# Patient Record
Sex: Female | Born: 1945
Health system: Southern US, Community
[De-identification: ages and names within clinical notes are randomized; demographics above are authoritative.]

## PROBLEM LIST (undated history)

## (undated) DIAGNOSIS — S82891A Other fracture of right lower leg, initial encounter for closed fracture: Secondary | ICD-10-CM

## (undated) DIAGNOSIS — M1990 Unspecified osteoarthritis, unspecified site: Secondary | ICD-10-CM

## (undated) DIAGNOSIS — D126 Benign neoplasm of colon, unspecified: Secondary | ICD-10-CM

## (undated) DIAGNOSIS — M359 Systemic involvement of connective tissue, unspecified: Secondary | ICD-10-CM

## (undated) DIAGNOSIS — F419 Anxiety disorder, unspecified: Secondary | ICD-10-CM

## (undated) DIAGNOSIS — I219 Acute myocardial infarction, unspecified: Secondary | ICD-10-CM

## (undated) DIAGNOSIS — G8929 Other chronic pain: Secondary | ICD-10-CM

## (undated) DIAGNOSIS — G629 Polyneuropathy, unspecified: Secondary | ICD-10-CM

## (undated) DIAGNOSIS — I10 Essential (primary) hypertension: Secondary | ICD-10-CM

## (undated) DIAGNOSIS — I4819 Other persistent atrial fibrillation: Secondary | ICD-10-CM

## (undated) DIAGNOSIS — H269 Unspecified cataract: Secondary | ICD-10-CM

## (undated) DIAGNOSIS — K219 Gastro-esophageal reflux disease without esophagitis: Secondary | ICD-10-CM

## (undated) DIAGNOSIS — R251 Tremor, unspecified: Secondary | ICD-10-CM

## (undated) DIAGNOSIS — M797 Fibromyalgia: Secondary | ICD-10-CM

## (undated) DIAGNOSIS — E785 Hyperlipidemia, unspecified: Secondary | ICD-10-CM

## (undated) DIAGNOSIS — J45909 Unspecified asthma, uncomplicated: Secondary | ICD-10-CM

## (undated) DIAGNOSIS — F32A Depression, unspecified: Secondary | ICD-10-CM

## (undated) DIAGNOSIS — D649 Anemia, unspecified: Secondary | ICD-10-CM

## (undated) DIAGNOSIS — I509 Heart failure, unspecified: Secondary | ICD-10-CM

## (undated) DIAGNOSIS — M545 Low back pain, unspecified: Secondary | ICD-10-CM

## (undated) DIAGNOSIS — F329 Major depressive disorder, single episode, unspecified: Secondary | ICD-10-CM

## (undated) DIAGNOSIS — J449 Chronic obstructive pulmonary disease, unspecified: Secondary | ICD-10-CM

## (undated) HISTORY — PX: VAGINAL HYSTERECTOMY: SUR661

## (undated) HISTORY — DX: Anxiety disorder, unspecified: F41.9

## (undated) HISTORY — DX: Benign neoplasm of colon, unspecified: D12.6

## (undated) HISTORY — PX: APPENDECTOMY: SHX54

## (undated) HISTORY — PX: TUBAL LIGATION: SHX77

## (undated) HISTORY — DX: Anemia, unspecified: D64.9

## (undated) HISTORY — DX: Gastro-esophageal reflux disease without esophagitis: K21.9

## (undated) HISTORY — DX: Hyperlipidemia, unspecified: E78.5

## (undated) HISTORY — PX: TONSILLECTOMY: SUR1361

## (undated) HISTORY — DX: Other persistent atrial fibrillation: I48.19

## (undated) HISTORY — DX: Fibromyalgia: M79.7

## (undated) HISTORY — DX: Major depressive disorder, single episode, unspecified: F32.9

## (undated) HISTORY — PX: DILATION AND CURETTAGE OF UTERUS: SHX78

## (undated) HISTORY — PX: FRACTURE SURGERY: SHX138

## (undated) HISTORY — DX: Depression, unspecified: F32.A

---

## 1976-09-22 HISTORY — PX: CHOLECYSTECTOMY OPEN: SUR202

## 2000-09-22 HISTORY — PX: SPLENECTOMY: SUR1306

## 2000-09-22 HISTORY — PX: ROUX-EN-Y GASTRIC BYPASS: SHX1104

## 2003-09-23 HISTORY — PX: MEDIAL PARTIAL KNEE REPLACEMENT: SHX5965

## 2007-09-23 DIAGNOSIS — I509 Heart failure, unspecified: Secondary | ICD-10-CM

## 2007-09-23 HISTORY — DX: Heart failure, unspecified: I50.9

## 2008-09-08 ENCOUNTER — Encounter (HOSPITAL_COMMUNITY): Admission: RE | Admit: 2008-09-08 | Discharge: 2008-09-20 | Payer: Self-pay | Admitting: Preventative Medicine

## 2009-06-01 ENCOUNTER — Encounter (HOSPITAL_COMMUNITY): Admission: RE | Admit: 2009-06-01 | Discharge: 2009-06-21 | Payer: Self-pay | Admitting: Preventative Medicine

## 2010-10-14 ENCOUNTER — Encounter: Payer: Self-pay | Admitting: Preventative Medicine

## 2011-09-23 HISTORY — PX: FEMUR FRACTURE SURGERY: SHX633

## 2011-10-17 ENCOUNTER — Other Ambulatory Visit (HOSPITAL_COMMUNITY): Payer: Self-pay | Admitting: Physician Assistant

## 2011-10-17 DIAGNOSIS — Z1231 Encounter for screening mammogram for malignant neoplasm of breast: Secondary | ICD-10-CM

## 2011-10-20 ENCOUNTER — Ambulatory Visit (HOSPITAL_COMMUNITY)
Admission: RE | Admit: 2011-10-20 | Discharge: 2011-10-20 | Disposition: A | Discharge status: Home / Self Care | Payer: Self-pay | Source: Ambulatory Visit | Attending: Physician Assistant | Admitting: Physician Assistant

## 2011-10-20 DIAGNOSIS — Z1231 Encounter for screening mammogram for malignant neoplasm of breast: Secondary | ICD-10-CM

## 2012-11-10 ENCOUNTER — Other Ambulatory Visit (HOSPITAL_COMMUNITY): Payer: Self-pay | Admitting: Family Medicine

## 2012-11-10 DIAGNOSIS — R1012 Left upper quadrant pain: Secondary | ICD-10-CM

## 2012-11-15 ENCOUNTER — Ambulatory Visit (HOSPITAL_COMMUNITY)
Admission: RE | Admit: 2012-11-15 | Discharge: 2012-11-15 | Disposition: A | Discharge status: Home / Self Care | Payer: Medicare Other | Source: Ambulatory Visit | Attending: Family Medicine | Admitting: Family Medicine

## 2012-11-15 DIAGNOSIS — R1012 Left upper quadrant pain: Secondary | ICD-10-CM | POA: Insufficient documentation

## 2012-11-15 DIAGNOSIS — K449 Diaphragmatic hernia without obstruction or gangrene: Secondary | ICD-10-CM | POA: Insufficient documentation

## 2012-11-15 DIAGNOSIS — K5730 Diverticulosis of large intestine without perforation or abscess without bleeding: Secondary | ICD-10-CM | POA: Insufficient documentation

## 2012-11-15 DIAGNOSIS — Z9884 Bariatric surgery status: Secondary | ICD-10-CM | POA: Insufficient documentation

## 2012-11-15 MED ORDER — IOHEXOL 300 MG/ML  SOLN
100.0000 mL | Freq: Once | INTRAMUSCULAR | Status: AC | PRN
  Administered 2012-11-15: 100 mL via INTRAVENOUS

## 2013-02-25 ENCOUNTER — Other Ambulatory Visit: Payer: Self-pay | Admitting: *Deleted

## 2013-02-25 ENCOUNTER — Other Ambulatory Visit: Payer: Self-pay

## 2013-02-25 DIAGNOSIS — W57XXXA Bitten or stung by nonvenomous insect and other nonvenomous arthropods, initial encounter: Secondary | ICD-10-CM

## 2013-02-25 DIAGNOSIS — T148 Other injury of unspecified body region: Secondary | ICD-10-CM

## 2013-02-25 LAB — POCT CBC
Granulocyte percent: 67.5 %G (ref 37–80)
HCT, POC: 36.4 % — AB (ref 37.7–47.9)
Hemoglobin: 12 g/dL — AB (ref 12.2–16.2)
Lymph, poc: 2.4 (ref 0.6–3.4)
MCH, POC: 25.8 pg — AB (ref 27–31.2)
MCHC: 32.8 g/dL (ref 31.8–35.4)
MCV: 78.5 fL — AB (ref 80–97)
MPV: 7.4 fL (ref 0–99.8)
POC Granulocyte: 6 (ref 2–6.9)
POC LYMPH PERCENT: 27.5 %L (ref 10–50)
Platelet Count, POC: 411 10*3/uL (ref 142–424)
RBC: 4.6 M/uL (ref 4.04–5.48)
RDW, POC: 16.4 %
WBC: 8.9 10*3/uL (ref 4.6–10.2)

## 2013-02-26 ENCOUNTER — Ambulatory Visit (INDEPENDENT_AMBULATORY_CARE_PROVIDER_SITE_OTHER): Payer: Medicare Other | Admitting: Family Medicine

## 2013-02-26 ENCOUNTER — Ambulatory Visit: Payer: Self-pay

## 2013-02-26 VITALS — BP 110/76 | HR 80 | Temp 99.1°F | Wt 178.2 lb

## 2013-02-26 DIAGNOSIS — L0390 Cellulitis, unspecified: Secondary | ICD-10-CM

## 2013-02-26 DIAGNOSIS — W57XXXA Bitten or stung by nonvenomous insect and other nonvenomous arthropods, initial encounter: Secondary | ICD-10-CM

## 2013-02-26 DIAGNOSIS — L0291 Cutaneous abscess, unspecified: Secondary | ICD-10-CM

## 2013-02-26 DIAGNOSIS — T148 Other injury of unspecified body region: Secondary | ICD-10-CM

## 2013-02-26 MED ORDER — DOXYCYCLINE HYCLATE 100 MG PO TABS: 100.0000 mg | ORAL_TABLET | Freq: Two times a day (BID) | ORAL | Status: DC

## 2013-02-26 NOTE — Progress Notes (Signed)
Patient ID: STORY CONTI, female   DOB: Jul 26, 1946, 67 y.o.   MRN: [id number] SUBJECTIVE: Chief Complaint  Patient presents with  . Acute Visit    tick bite had dental sugrery on monday took antibiotics sunday nite prior to surgery   HPI: Came yesterday late and had labwork and given an appointment to come today to follow up onher multiple tick bites on her legs. Some are expanding redness and soreness and she doesn't feel right. Low grade fever started today as well. Had to chase her dog into the woods.  PMH/PSH: reviewed/updated in Epic  SH/FH: reviewed/updated in Epic  Allergies: reviewed/updated in Epic  Medications: reviewed/updated in Epic  Immunizations: reviewed/updated in Epic  ROS: As above in the HPI. All other systems are stable or negative.  OBJECTIVE: APPEARANCE:  Patient in no acute distress.The patient appeared well nourished and normally developed. Acyanotic. Waist: VITAL SIGNS:BP 110/76  Pulse 80  Temp(Src) 99.1 F (37.3 C) (Oral)  Wt 178 lb 3.2 oz (80.831 kg) Obese WF  SKIN: warm and  Dry with areas on her legs of insect bites, with red irregular indurated rashes of varying sizes, some 3 inches , some 4 inches in diameter. no tattoos and scars No bull's eye lesions, no ecchymosis.  HEAD and Neck: without JVD, Head and scalp: normal Eyes:No scleral icterus. Fundi normal, eye movements normal. Ears: Auricle normal, canal normal, Tympanic membranes normal, insufflation normal. Nose: normal Throat: normal Neck & thyroid: normal  CHEST & LUNGS: Chest wall: normal Lungs: Clear  CVS: Reveals the PMI to be normally located. Regular rhythm, First and Second Heart sounds are normal,  absence of murmurs, rubs or gallops. Peripheral vasculature: Radial pulses: normal Dorsal pedis pulses: normal Posterior pulses: normal  ABDOMEN:  Appearance: normal Benign, no organomegaly, no masses, no Abdominal Aortic enlargement. No Guarding , no rebound. No  Bruits. Bowel sounds: normal  RECTAL: N/A GU: N/A  NEUROLOGIC: oriented to time,place and person; nonfocal.  ASSESSMENT: Tick bite - Plan: doxycycline (VIBRA-TABS) 100 MG tablet  Cellulitis - Plan: doxycycline (VIBRA-TABS) 100 MG tablet  PLAN: No orders of the defined types were placed in this encounter.   Results for orders placed in visit on 02/25/13  POCT CBC      Result Value Range   WBC 8.9  4.6 - 10.2 K/uL   Lymph, poc 2.4  0.6 - 3.4   POC LYMPH PERCENT 27.5  10 - 50 %L   POC Granulocyte 6.0  2 - 6.9   Granulocyte percent 67.5  37 - 80 %G   RBC 4.6  4.04 - 5.48 M/uL   Hemoglobin 12.0 (*) 12.2 - 16.2 g/dL   HCT, POC 40.9 (*) 81.1 - 47.9 %   MCV 78.5 (*) 80 - 97 fL   MCH, POC 25.8 (*) 27 - 31.2 pg   MCHC 32.8  31.8 - 35.4 g/dL   RDW, POC 91.4     Platelet Count, POC 411.0  142 - 424 K/uL   MPV 7.4  0 - 99.8 fL   Meds ordered this encounter  Medications  . DULoxetine (CYMBALTA) 60 MG capsule    Sig: Take 60 mg by mouth daily.  . traZODone (DESYREL) 50 MG tablet    Sig: Take 50 mg by mouth at bedtime.  Marland Kitchen doxycycline (VIBRA-TABS) 100 MG tablet    Sig: Take 1 tablet (100 mg total) by mouth 2 (two) times daily.    Dispense:  28 tablet    Refill:  1   discussed with patient  That although her labs were sent and not back for RMSF and lymes  She needs to be treated because her Symptoms and early findings is highly suspicious for RMSF. Needs to start on antibiotics now. If worse with high fevers and ecchymosis and worse over the weekend she may need recheck in th ER setting. Otherwise I will recheck her on Monday. Return in about 2 days (around 02/28/2013) for Recheck medical problems.  Katalia Choma P. [place] Charon, M.D.

## 2013-02-28 ENCOUNTER — Ambulatory Visit (INDEPENDENT_AMBULATORY_CARE_PROVIDER_SITE_OTHER): Payer: Medicare Other | Admitting: Family Medicine

## 2013-02-28 ENCOUNTER — Encounter: Payer: Self-pay | Admitting: Family Medicine

## 2013-02-28 VITALS — BP 131/88 | HR 88 | Temp 98.9°F | Wt 178.2 lb

## 2013-02-28 DIAGNOSIS — L0291 Cutaneous abscess, unspecified: Secondary | ICD-10-CM

## 2013-02-28 DIAGNOSIS — W57XXXA Bitten or stung by nonvenomous insect and other nonvenomous arthropods, initial encounter: Secondary | ICD-10-CM

## 2013-02-28 DIAGNOSIS — J309 Allergic rhinitis, unspecified: Secondary | ICD-10-CM

## 2013-02-28 DIAGNOSIS — L0390 Cellulitis, unspecified: Secondary | ICD-10-CM

## 2013-02-28 DIAGNOSIS — T148 Other injury of unspecified body region: Secondary | ICD-10-CM

## 2013-02-28 LAB — ROCKY MTN SPOTTED FVR ABS PNL(IGG+IGM)
RMSF IgG: 1.31 IV — ABNORMAL HIGH
RMSF IgM: 0.36 IV

## 2013-02-28 LAB — B. BURGDORFI ANTIBODIES: B burgdorferi Ab IgG+IgM: 0.39 {ISR}

## 2013-02-28 MED ORDER — FLUTICASONE PROPIONATE 50 MCG/ACT NA SUSP: 2.0000 | Freq: Every day | NASAL | Status: DC

## 2013-02-28 NOTE — Progress Notes (Signed)
Patient ID: Sabrina [name], female   DOB: Jul 05, 1946, 67 y.o.   MRN: [id number] SUBJECTIVE: Chief Complaint  Patient presents with  . Follow-up    RECK TICK BITES DOING BETTER   HPI: The areas of tick bites are less swollen and less red. Better now except that patient has nasal congestion. Allergies kicking up.  PMH/PSH: reviewed/updated in Epic  SH/FH: reviewed/updated in Epic  Allergies: reviewed/updated in Epic  Medications: reviewed/updated in Epic  Immunizations: reviewed/updated in Epic  ROS: As above in the HPI. All other systems are stable or negative.  OBJECTIVE: APPEARANCE:  Patient in no acute distress.The patient appeared well nourished and normally developed. Acyanotic. Waist: VITAL SIGNS:BP 131/88  Pulse 88  Temp(Src) 98.9 F (37.2 C) (Oral)  Wt 178 lb 3.2 oz (80.831 kg) WF  SKIN: warm and  Dry without overt rashes, tattoos and scars. areas of tick bites are better. The cellulitis is better  HEAD and Neck: without JVD, Head and scalp: normal Eyes:No scleral icterus. Fundi normal, eye movements normal. Ears: Auricle normal, canal normal, Tympanic membranes normal, insufflation normal. Nose: rhinitis, nasal congestion Throat: normal Neck & thyroid: normal  CHEST & LUNGS: Chest wall: normal Lungs: Clear  CVS: Reveals the PMI to be normally located. Regular rhythm, First and Second Heart sounds are normal,  absence of murmurs, rubs or gallops. Peripheral vasculature: Radial pulses: normal Dorsal pedis pulses: normal Posterior pulses: normal  ABDOMEN:  Appearance: normal Benign, no organomegaly, no masses, no Abdominal Aortic enlargement. No Guarding , no rebound. No Bruits. Bowel sounds: normal  RECTAL: N/A GU: N/A  EXTREMETIES: nonedematous. Both Femoral and Pedal pulses are normal.  MUSCULOSKELETAL:  Spine: normal Joints: intact  NEUROLOGIC: oriented to time,place and person; nonfocal. Strength is normal Sensory is  normal Reflexes are normal Cranial Nerves are normal.  ASSESSMENT: Allergic rhinitis - Plan: fluticasone (FLONASE) 50 MCG/ACT nasal spray  Tick bites  Cellulitis and abscess   PLAN: Continue antibiotics Skin care. No orders of the defined types were placed in this encounter.   Meds ordered this encounter  Medications  . fluticasone (FLONASE) 50 MCG/ACT nasal spray    Sig: Place 2 sprays into the nose daily.    Dispense:  16 g    Refill:  6   Demonstrated use of inhalers.  RTC prn. Quintan Saldivar P. [place] Charon, M.D.

## 2013-03-01 NOTE — Progress Notes (Signed)
Quick Note:  Labs abnormal. Patient may have had [place] spotted fever in the past.  NOT recent with the recent tick bites. Lymes test is negative. No change in plans. ______

## 2013-03-24 ENCOUNTER — Other Ambulatory Visit: Payer: Self-pay | Admitting: *Deleted

## 2013-03-24 ENCOUNTER — Other Ambulatory Visit: Payer: Self-pay | Admitting: Family Medicine

## 2013-03-24 MED ORDER — MELOXICAM 15 MG PO TABS: 15.0000 mg | ORAL_TABLET | Freq: Every day | ORAL | Status: DC

## 2013-03-24 NOTE — Telephone Encounter (Signed)
Patient last seen in office on 02-28-13. We do not have this med on current med list. Please advise

## 2013-03-28 ENCOUNTER — Other Ambulatory Visit: Payer: Self-pay | Admitting: Family Medicine

## 2013-03-28 NOTE — Telephone Encounter (Signed)
mobic rx was sent in 03/24/13

## 2013-03-29 NOTE — Telephone Encounter (Signed)
Left message for pt that mobic had been sent in by [facility] on March 24 2013 and to return call if med not at drug store.

## 2013-04-05 NOTE — Telephone Encounter (Signed)
Patient was seen by me for vertigo and not for anxiety.  I would like her to follow up with me for xanax refill.

## 2013-04-05 NOTE — Telephone Encounter (Signed)
Bill Oxford's note on 7/15 was erroneous document on incorrect patient.

## 2014-02-01 ENCOUNTER — Ambulatory Visit (INDEPENDENT_AMBULATORY_CARE_PROVIDER_SITE_OTHER): Payer: Medicare HMO | Admitting: Family Medicine

## 2014-02-01 ENCOUNTER — Telehealth: Payer: Self-pay | Admitting: Nurse Practitioner

## 2014-02-01 ENCOUNTER — Encounter: Payer: Self-pay | Admitting: Family Medicine

## 2014-02-01 VITALS — BP 120/81 | HR 101 | Temp 99.3°F | Ht 65.0 in | Wt 186.0 lb

## 2014-02-01 DIAGNOSIS — L0292 Furuncle, unspecified: Secondary | ICD-10-CM

## 2014-02-01 DIAGNOSIS — L0293 Carbuncle, unspecified: Secondary | ICD-10-CM

## 2014-02-01 MED ORDER — AMOXICILLIN 875 MG PO TABS: 875.0000 mg | ORAL_TABLET | Freq: Two times a day (BID) | ORAL | Status: DC

## 2014-02-01 NOTE — Telephone Encounter (Signed)
appt given for today at 2:30 with bill for knot on ear

## 2014-02-01 NOTE — Progress Notes (Signed)
   Subjective:    Patient ID: Sabrina [name], female    DOB: 1945-09-23, 67 y.o.   MRN: [id number]  HPI This 68 y.o. female presents for evaluation of right ear cyst.   Review of Systems C/o cyst behind right ear   No chest pain, SOB, HA, dizziness, vision change, N/V, diarrhea, constipation, dysuria, urinary urgency or frequency, myalgias, arthralgias or rash.  Objective:   Physical Exam  Vital signs noted  Well developed well nourished female.  HEENT - Head atraumatic Normocephalic                Eyes - PERRLA, Conjuctiva - clear Sclera- Clear EOMI                Ears - cyst behind right ear w/o fluctuance                 Throat - oropharanx wnl Respiratory - Lungs CTA bilateral Cardiac - RRR S1 and S2 without murmur GI - Abdomen soft Nontender and bowel sounds active x 4      Assessment & Plan:  Furunculosis - Plan: amoxicillin (AMOXIL) 875 MG tablet po bid x 10 days.  Recommend warm Compress to area and follow up prn.  Lysbeth Penner FNP

## 2014-02-08 ENCOUNTER — Ambulatory Visit: Payer: Medicare HMO | Admitting: Family Medicine

## 2014-02-09 ENCOUNTER — Ambulatory Visit (INDEPENDENT_AMBULATORY_CARE_PROVIDER_SITE_OTHER): Payer: Medicare HMO | Admitting: Family Medicine

## 2014-02-09 ENCOUNTER — Encounter: Payer: Self-pay | Admitting: Family Medicine

## 2014-02-09 ENCOUNTER — Telehealth: Payer: Self-pay | Admitting: Family Medicine

## 2014-02-09 VITALS — BP 120/81 | HR 88 | Temp 98.6°F | Ht 65.0 in | Wt 186.2 lb

## 2014-02-09 DIAGNOSIS — F32A Depression, unspecified: Secondary | ICD-10-CM

## 2014-02-09 DIAGNOSIS — M542 Cervicalgia: Secondary | ICD-10-CM

## 2014-02-09 DIAGNOSIS — F3289 Other specified depressive episodes: Secondary | ICD-10-CM

## 2014-02-09 DIAGNOSIS — G4700 Insomnia, unspecified: Secondary | ICD-10-CM

## 2014-02-09 DIAGNOSIS — R5381 Other malaise: Secondary | ICD-10-CM

## 2014-02-09 DIAGNOSIS — F329 Major depressive disorder, single episode, unspecified: Secondary | ICD-10-CM

## 2014-02-09 DIAGNOSIS — R5383 Other fatigue: Secondary | ICD-10-CM

## 2014-02-09 MED ORDER — TRAZODONE HCL 50 MG PO TABS: 50.0000 mg | ORAL_TABLET | Freq: Every day | ORAL | Status: DC

## 2014-02-09 MED ORDER — DULOXETINE HCL 30 MG PO CPEP: 30.0000 mg | ORAL_CAPSULE | Freq: Every day | ORAL | Status: DC

## 2014-02-09 MED ORDER — MELOXICAM 15 MG PO TABS: 15.0000 mg | ORAL_TABLET | Freq: Every day | ORAL | Status: DC

## 2014-02-09 NOTE — Telephone Encounter (Signed)
Appt put in EPIC

## 2014-02-09 NOTE — Progress Notes (Signed)
   Subjective:    Patient ID: Sabrina [name], female    DOB: [birth year], 68 y.o.   MRN: [id number]  HPI This 68 y.o. female presents for evaluation of needing meds refilled.  Increased anxiety and depresson sx's and wants to get back on her medications.  She is having furunculosis of the right ear lobe and she states she has pain in her right jaw and right neck.  She is worried about having neck cancer and states she has funny taste in her mouth and she has pain in her mouth and tongue..  Review of Systems C/o right jaw and neck discomfort. No chest pain, SOB, HA, dizziness, vision change, N/V, diarrhea, constipation, dysuria, urinary urgency or frequency, myalgias, arthralgias or rash.     Objective:   Physical Exam Vital signs noted  Well developed well nourished female.  HEENT - Head atraumatic Normocephalic                Eyes - PERRLA, Conjuctiva - clear Sclera- Clear EOMI                Ears - EAC's Wnl TM's Wnl Gross Hearing WNL                Nose - Nares patent                 Throat - oropharanx wnl no masses endutulous                Neck - no palpable masses Respiratory - Lungs CTA bilateral Cardiac - RRR S1 and S2 without murmur GI - Abdomen soft Nontender and bowel sounds active x 4 Extremities - No edema. Neuro - Grossly intact.       Assessment & Plan:  Neck pain - Plan: CT Soft Tissue Neck W Contrast, meloxicam (MOBIC) 15 MG tablet, DULoxetine (CYMBALTA) 30 MG capsule, traZODone (DESYREL) 50 MG tablet, DISCONTINUED: traZODone (DESYREL) 50 MG tablet  Other malaise and fatigue - Plan: CMP14+EGFR, Anemia Profile B, Vit D  25 hydroxy (rtn osteoporosis monitoring), Thyroid Panel With TSH, Vitamin B12, meloxicam (MOBIC) 15 MG tablet, DULoxetine (CYMBALTA) 30 MG capsule, traZODone (DESYREL) 50 MG tablet, CANCELED: POCT CBC, DISCONTINUED: traZODone (DESYREL) 50 MG tablet  Depression - Plan: meloxicam (MOBIC) 15 MG tablet, DULoxetine (CYMBALTA) 30 MG capsule, traZODone  (DESYREL) 50 MG tablet, DISCONTINUED: traZODone (DESYREL) 50 MG tablet  Insomnia - Plan: meloxicam (MOBIC) 15 MG tablet, DULoxetine (CYMBALTA) 30 MG capsule, traZODone (DESYREL) 50 MG tablet, DISCONTINUED: traZODone (DESYREL) 50 MG tablet  Follow up in one month  Lysbeth Penner FNP

## 2014-02-10 LAB — CMP14+EGFR
ALT: 10 IU/L (ref 0–32)
AST: 21 IU/L (ref 0–40)
Albumin/Globulin Ratio: 1.6 (ref 1.1–2.5)
Albumin: 4.1 g/dL (ref 3.6–4.8)
Alkaline Phosphatase: 132 IU/L — ABNORMAL HIGH (ref 39–117)
BUN/Creatinine Ratio: 15 (ref 11–26)
BUN: 14 mg/dL (ref 8–27)
CO2: 21 mmol/L (ref 18–29)
Calcium: 9.3 mg/dL (ref 8.7–10.3)
Chloride: 98 mmol/L (ref 97–108)
Creatinine, Ser: 0.96 mg/dL (ref 0.57–1.00)
GFR calc Af Amer: 71 mL/min/{1.73_m2} (ref >59)
GFR calc non Af Amer: 62 mL/min/{1.73_m2} (ref >59)
Globulin, Total: 2.5 g/dL (ref 1.5–4.5)
Glucose: 77 mg/dL (ref 65–99)
Potassium: 5.2 mmol/L (ref 3.5–5.2)
Sodium: 136 mmol/L (ref 134–144)
Total Bilirubin: 0.5 mg/dL (ref 0.0–1.2)
Total Protein: 6.6 g/dL (ref 6.0–8.5)

## 2014-02-10 LAB — ANEMIA PROFILE B
Basophils Absolute: 0.1 10*3/uL (ref 0.0–0.2)
Basos: 1 %
Eos: 1 %
Eosinophils Absolute: 0.1 10*3/uL (ref 0.0–0.4)
Ferritin: 8 ng/mL — ABNORMAL LOW (ref 15–150)
Folate: 18.3 ng/mL (ref >3.0)
HCT: 40.1 % (ref 34.0–46.6)
Hemoglobin: 12.9 g/dL (ref 11.1–15.9)
Immature Grans (Abs): 0 10*3/uL (ref 0.0–0.1)
Immature Granulocytes: 0 %
Iron Saturation: 11 % — ABNORMAL LOW (ref 15–55)
Iron: 44 ug/dL (ref 35–155)
Lymphocytes Absolute: 2.3 10*3/uL (ref 0.7–3.1)
Lymphs: 23 %
MCH: 26.2 pg — ABNORMAL LOW (ref 26.6–33.0)
MCHC: 32.2 g/dL (ref 31.5–35.7)
MCV: 81 fL (ref 79–97)
Monocytes Absolute: 0.7 10*3/uL (ref 0.1–0.9)
Monocytes: 7 %
Neutrophils Absolute: 6.6 10*3/uL (ref 1.4–7.0)
Neutrophils Relative %: 68 %
Platelets: 385 10*3/uL — ABNORMAL HIGH (ref 150–379)
RBC: 4.93 x10E6/uL (ref 3.77–5.28)
RDW: 15.7 % — ABNORMAL HIGH (ref 12.3–15.4)
Retic Ct Pct: 1 % (ref 0.6–2.6)
TIBC: 414 ug/dL (ref 250–450)
UIBC: 370 ug/dL (ref 150–375)
Vitamin B-12: 155 pg/mL — ABNORMAL LOW (ref 211–946)
WBC: 9.8 10*3/uL (ref 3.4–10.8)

## 2014-02-10 LAB — VITAMIN D 25 HYDROXY (VIT D DEFICIENCY, FRACTURES): Vit D, 25-Hydroxy: 10.4 ng/mL — ABNORMAL LOW (ref 30.0–100.0)

## 2014-02-10 LAB — THYROID PANEL WITH TSH
Free Thyroxine Index: 2 (ref 1.2–4.9)
T3 Uptake Ratio: 28 % (ref 24–39)
T4, Total: 7 ug/dL (ref 4.5–12.0)
TSH: 4.41 u[IU]/mL (ref 0.450–4.500)

## 2014-02-14 ENCOUNTER — Other Ambulatory Visit: Payer: Self-pay | Admitting: Family Medicine

## 2014-02-14 DIAGNOSIS — D649 Anemia, unspecified: Secondary | ICD-10-CM

## 2014-02-14 DIAGNOSIS — E538 Deficiency of other specified B group vitamins: Secondary | ICD-10-CM

## 2014-02-14 MED ORDER — FERROUS SULFATE 325 (65 FE) MG PO TBEC: 325.0000 mg | DELAYED_RELEASE_TABLET | Freq: Every day | ORAL | Status: DC

## 2014-02-14 MED ORDER — CYANOCOBALAMIN 1000 MCG/ML IJ SOLN: INTRAMUSCULAR | Status: DC

## 2014-02-17 ENCOUNTER — Encounter (HOSPITAL_COMMUNITY): Payer: Self-pay

## 2014-02-17 ENCOUNTER — Ambulatory Visit (HOSPITAL_COMMUNITY)
Admission: RE | Admit: 2014-02-17 | Discharge: 2014-02-17 | Disposition: A | Discharge status: Home / Self Care | Payer: Medicare HMO | Source: Ambulatory Visit | Attending: Family Medicine | Admitting: Family Medicine

## 2014-02-17 DIAGNOSIS — M542 Cervicalgia: Secondary | ICD-10-CM

## 2014-02-17 DIAGNOSIS — M47812 Spondylosis without myelopathy or radiculopathy, cervical region: Secondary | ICD-10-CM | POA: Diagnosis not present

## 2014-02-17 MED ORDER — IOHEXOL 300 MG/ML  SOLN
80.0000 mL | Freq: Once | INTRAMUSCULAR | Status: AC | PRN
  Administered 2014-02-17: 80 mL via INTRAVENOUS

## 2014-02-21 ENCOUNTER — Ambulatory Visit (INDEPENDENT_AMBULATORY_CARE_PROVIDER_SITE_OTHER): Payer: Medicare HMO | Admitting: *Deleted

## 2014-02-21 DIAGNOSIS — E538 Deficiency of other specified B group vitamins: Secondary | ICD-10-CM

## 2014-02-21 MED ORDER — CYANOCOBALAMIN 1000 MCG/ML IJ SOLN
1000.0000 ug | Freq: Every day | INTRAMUSCULAR | Status: AC
  Administered 2014-02-21 – 2014-02-28 (×6): 1000 ug via INTRAMUSCULAR

## 2014-02-21 NOTE — Patient Instructions (Signed)
Vitamin B12 Injections Every person needs vitamin B12. A deficiency develops when the body does not get enough of it. One way to overcome this is by getting B12 shots (injections). A B12 shot puts the vitamin directly into muscle tissue. This avoids any problems your body might have in absorbing it from food or a pill. In some people, the body has trouble using the vitamin correctly. This can cause a B12 deficiency. Not consuming enough of the vitamin can also cause a deficiency. Getting enough vitamin B12 can be hard for elderly people. Sometimes, they do not eat a well-balanced diet. The elderly are also more likely than younger people to have medical conditions or take medications that can lead to a deficiency. WHAT DOES VITAMIN B12 DO? Vitamin B12 does many things to help the body work right:  It helps the body make healthy red blood cells.  It helps maintain nerve cells.  It is involved in the body's process of converting food into energy (metabolism).  It is needed to make the genetic material in all cells (DNA). VITAMIN B12 FOOD SOURCES Most people get plenty of vitamin B12 through the foods they eat. It is present in:  Meat, fish, poultry, and eggs.  Milk and milk products.  It also is added when certain foods are made, including some breads, cereals and yogurts. The food is then called "fortified". CAUSES The most common causes of vitamin B12 deficiency are:  Pernicious anemia. The condition develops when the body cannot make enough healthy red blood cells. This stems from a lack of a protein made in the stomach (intrinsic factor). People without this protein cannot absorb enough vitamin B12 from food.  Malabsorption. This is when the body cannot absorb the vitamin. It can be caused by:  Pernicious anemia.  Surgery to remove part or all of the stomach can lead to malabsorption. Removal of part or all of the small intestine can also cause malabsorption.  Vegetarian diet.  People who are strict about not eating foods from animals could have trouble taking in enough vitamin B12 from diet alone.  Medications. Some medicines have been linked to B12 deficiency, such as Metformin (a drug prescribed for type 2 diabetes). Long-term use of stomach acid suppressants also can keep the vitamin from being absorbed.  Intestinal problems such as inflammatory bowel disease. If there are problems in the digestive tract, vitamin B12 may not be absorbed in good enough amounts. SYMPTOMS People who do not get enough B12 can develop problems. These can include:  Anemia. This is when the body has too few red blood cells. Red blood cells carry oxygen to the rest of the body. Without a healthy supply of red blood cells, people can feel:  Tired (fatigued).  Weak.  Severe anemia can cause:  Shortness of breath.  Dizziness.  Rapid heart rate.  Paleness.  Other Vitamin B12 deficiency symptoms include:  Diarrhea.  Numbness or tingling in the hands or feet.  Loss of appetite.  Confusion.  Sores on the tongue or in the mouth. LET YOUR CAREGIVER KNOW ABOUT:  Any allergies. It is very important to know if you are allergic or sensitive to cobalt. Vitamin B12 contains cobalt.  Any history of kidney disease.  All medications you are taking. Include prescription and over-the-counter medicines, herbs and creams.  Whether you are pregnant or breast-feeding.  If you have Leber's disease, a hereditary eye condition, vitamin B12 could make it worse. RISKS AND COMPLICATIONS Reactions to an injection are   usually temporary. They might include:  Pain at the injection site.  Redness, swelling or tenderness at the site.  Headache, dizziness or weakness.  Nausea, upset stomach or diarrhea.  Numbness or tingling.  Fever.  Joint pain.  Itching or rash. If a reaction does not go away in a short while, talk with your healthcare provider. A change in the way the shots are  given, or where they are given, might need to be made. BEFORE AN INJECTION To decide whether B12 injections are right for you, your healthcare provider will probably:  Ask about your medical history.  Ask questions about your diet.  Ask about symptoms such as:  Have you felt weak?  Do you feel unusually tired?  Do you get dizzy?  Order blood tests. These may include a test to:  Check the level of red cells in your blood.  Measure B12 levels.  Check for the presence of intrinsic factor. VITAMIN B12 INJECTIONS How often you will need a vitamin B12 injection will depend on how severe your deficiency is. This also will affect how long you will need to get them. People with pernicious anemia usually get injections for their entire life. Others might get them for a shorter period. For many people, injections are given daily or weekly for several weeks. Then, once B12 levels are normal, injections are given just once a month. If the cause of the deficiency can be fixed, the injections can be stopped. Talk with your healthcare provider about what you should expect. For an injection:  The injection site will be cleaned with an alcohol swab.  Your healthcare provider will insert a needle directly into a muscle. Most any muscle can be used. Most often, an arm muscle is used. A buttocks muscle can also be used. Many people say shots in that area are less painful.  A small adhesive bandage may be put over the injection site. It usually can be taken off in an hour or less. Injections can be given by your healthcare provider. In some cases, family members give them. Sometimes, people give them to themselves. Talk with your healthcare provider about what would be best for you. If someone other than your healthcare provider will be giving the shots, the person will need to be trained to give them correctly. HOME CARE INSTRUCTIONS   You can remove the adhesive bandage within an hour of getting a  shot.  You should be able to go about your normal activities right away.  Avoid drinking large amounts of alcohol while taking vitamin B12 shots. Alcohol can interfere with the body's use of the vitamin. SEEK MEDICAL CARE IF:   Pain, redness, swelling or tenderness at the injection site does not get better or gets worse.  Headache, dizziness or weakness does not go away.  You develop a fever of more than 100.5 F (38.1 C). SEEK IMMEDIATE MEDICAL CARE IF:   You have chest pain.  You develop shortness of breath.  You have muscle weakness that gets worse.  You develop numbness, weakness or tingling on one side or one area of the body.  You have symptoms of an allergic reaction, such as:  Hives.  Difficulty breathing.  Swelling of the lips, face, tongue or throat.  You develop a fever of more than 102.0 F (38.9 C). MAKE SURE YOU:   Understand these instructions.  Will watch your condition.  Will get help right away if you are not doing well or get worse. Document   Released: 12/05/2008 Document Revised: 12/01/2011 Document Reviewed: 12/05/2008 Exit[employer] Patient Information 2014 [employer].  

## 2014-02-21 NOTE — Progress Notes (Signed)
Vitamin b12 injection started today and patient tolerated well

## 2014-02-22 ENCOUNTER — Ambulatory Visit (INDEPENDENT_AMBULATORY_CARE_PROVIDER_SITE_OTHER): Payer: Medicare HMO | Admitting: *Deleted

## 2014-02-22 DIAGNOSIS — E538 Deficiency of other specified B group vitamins: Secondary | ICD-10-CM

## 2014-02-22 NOTE — Patient Instructions (Signed)
Vitamin B12 Injections Every person needs vitamin B12. A deficiency develops when the body does not get enough of it. One way to overcome this is by getting B12 shots (injections). A B12 shot puts the vitamin directly into muscle tissue. This avoids any problems your body might have in absorbing it from food or a pill. In some people, the body has trouble using the vitamin correctly. This can cause a B12 deficiency. Not consuming enough of the vitamin can also cause a deficiency. Getting enough vitamin B12 can be hard for elderly people. Sometimes, they do not eat a well-balanced diet. The elderly are also more likely than younger people to have medical conditions or take medications that can lead to a deficiency. WHAT DOES VITAMIN B12 DO? Vitamin B12 does many things to help the body work right:  It helps the body make healthy red blood cells.  It helps maintain nerve cells.  It is involved in the body's process of converting food into energy (metabolism).  It is needed to make the genetic material in all cells (DNA). VITAMIN B12 FOOD SOURCES Most people get plenty of vitamin B12 through the foods they eat. It is present in:  Meat, fish, poultry, and eggs.  Milk and milk products.  It also is added when certain foods are made, including some breads, cereals and yogurts. The food is then called "fortified". CAUSES The most common causes of vitamin B12 deficiency are:  Pernicious anemia. The condition develops when the body cannot make enough healthy red blood cells. This stems from a lack of a protein made in the stomach (intrinsic factor). People without this protein cannot absorb enough vitamin B12 from food.  Malabsorption. This is when the body cannot absorb the vitamin. It can be caused by:  Pernicious anemia.  Surgery to remove part or all of the stomach can lead to malabsorption. Removal of part or all of the small intestine can also cause malabsorption.  Vegetarian diet.  People who are strict about not eating foods from animals could have trouble taking in enough vitamin B12 from diet alone.  Medications. Some medicines have been linked to B12 deficiency, such as Metformin (a drug prescribed for type 2 diabetes). Long-term use of stomach acid suppressants also can keep the vitamin from being absorbed.  Intestinal problems such as inflammatory bowel disease. If there are problems in the digestive tract, vitamin B12 may not be absorbed in good enough amounts. SYMPTOMS People who do not get enough B12 can develop problems. These can include:  Anemia. This is when the body has too few red blood cells. Red blood cells carry oxygen to the rest of the body. Without a healthy supply of red blood cells, people can feel:  Tired (fatigued).  Weak.  Severe anemia can cause:  Shortness of breath.  Dizziness.  Rapid heart rate.  Paleness.  Other Vitamin B12 deficiency symptoms include:  Diarrhea.  Numbness or tingling in the hands or feet.  Loss of appetite.  Confusion.  Sores on the tongue or in the mouth. LET YOUR CAREGIVER KNOW ABOUT:  Any allergies. It is very important to know if you are allergic or sensitive to cobalt. Vitamin B12 contains cobalt.  Any history of kidney disease.  All medications you are taking. Include prescription and over-the-counter medicines, herbs and creams.  Whether you are pregnant or breast-feeding.  If you have Leber's disease, a hereditary eye condition, vitamin B12 could make it worse. RISKS AND COMPLICATIONS Reactions to an injection are   usually temporary. They might include:  Pain at the injection site.  Redness, swelling or tenderness at the site.  Headache, dizziness or weakness.  Nausea, upset stomach or diarrhea.  Numbness or tingling.  Fever.  Joint pain.  Itching or rash. If a reaction does not go away in a short while, talk with your healthcare provider. A change in the way the shots are  given, or where they are given, might need to be made. BEFORE AN INJECTION To decide whether B12 injections are right for you, your healthcare provider will probably:  Ask about your medical history.  Ask questions about your diet.  Ask about symptoms such as:  Have you felt weak?  Do you feel unusually tired?  Do you get dizzy?  Order blood tests. These may include a test to:  Check the level of red cells in your blood.  Measure B12 levels.  Check for the presence of intrinsic factor. VITAMIN B12 INJECTIONS How often you will need a vitamin B12 injection will depend on how severe your deficiency is. This also will affect how long you will need to get them. People with pernicious anemia usually get injections for their entire life. Others might get them for a shorter period. For many people, injections are given daily or weekly for several weeks. Then, once B12 levels are normal, injections are given just once a month. If the cause of the deficiency can be fixed, the injections can be stopped. Talk with your healthcare provider about what you should expect. For an injection:  The injection site will be cleaned with an alcohol swab.  Your healthcare provider will insert a needle directly into a muscle. Most any muscle can be used. Most often, an arm muscle is used. A buttocks muscle can also be used. Many people say shots in that area are less painful.  A small adhesive bandage may be put over the injection site. It usually can be taken off in an hour or less. Injections can be given by your healthcare provider. In some cases, family members give them. Sometimes, people give them to themselves. Talk with your healthcare provider about what would be best for you. If someone other than your healthcare provider will be giving the shots, the person will need to be trained to give them correctly. HOME CARE INSTRUCTIONS   You can remove the adhesive bandage within an hour of getting a  shot.  You should be able to go about your normal activities right away.  Avoid drinking large amounts of alcohol while taking vitamin B12 shots. Alcohol can interfere with the body's use of the vitamin. SEEK MEDICAL CARE IF:   Pain, redness, swelling or tenderness at the injection site does not get better or gets worse.  Headache, dizziness or weakness does not go away.  You develop a fever of more than 100.5 F (38.1 C). SEEK IMMEDIATE MEDICAL CARE IF:   You have chest pain.  You develop shortness of breath.  You have muscle weakness that gets worse.  You develop numbness, weakness or tingling on one side or one area of the body.  You have symptoms of an allergic reaction, such as:  Hives.  Difficulty breathing.  Swelling of the lips, face, tongue or throat.  You develop a fever of more than 102.0 F (38.9 C). MAKE SURE YOU:   Understand these instructions.  Will watch your condition.  Will get help right away if you are not doing well or get worse. Document   Released: 12/05/2008 Document Revised: 12/01/2011 Document Reviewed: 12/05/2008 Exit[employer] Patient Information 2014 [employer].  

## 2014-02-22 NOTE — Progress Notes (Signed)
Vitamin b12 given and tolerated well. 

## 2014-02-23 ENCOUNTER — Ambulatory Visit (INDEPENDENT_AMBULATORY_CARE_PROVIDER_SITE_OTHER): Payer: Medicare HMO | Admitting: *Deleted

## 2014-02-23 DIAGNOSIS — E538 Deficiency of other specified B group vitamins: Secondary | ICD-10-CM

## 2014-02-23 NOTE — Patient Instructions (Signed)
Vitamin B12 Injections Every person needs vitamin B12. A deficiency develops when the body does not get enough of it. One way to overcome this is by getting B12 shots (injections). A B12 shot puts the vitamin directly into muscle tissue. This avoids any problems your body might have in absorbing it from food or a pill. In some people, the body has trouble using the vitamin correctly. This can cause a B12 deficiency. Not consuming enough of the vitamin can also cause a deficiency. Getting enough vitamin B12 can be hard for elderly people. Sometimes, they do not eat a well-balanced diet. The elderly are also more likely than younger people to have medical conditions or take medications that can lead to a deficiency. WHAT DOES VITAMIN B12 DO? Vitamin B12 does many things to help the body work right:  It helps the body make healthy red blood cells.  It helps maintain nerve cells.  It is involved in the body's process of converting food into energy (metabolism).  It is needed to make the genetic material in all cells (DNA). VITAMIN B12 FOOD SOURCES Most people get plenty of vitamin B12 through the foods they eat. It is present in:  Meat, fish, poultry, and eggs.  Milk and milk products.  It also is added when certain foods are made, including some breads, cereals and yogurts. The food is then called "fortified". CAUSES The most common causes of vitamin B12 deficiency are:  Pernicious anemia. The condition develops when the body cannot make enough healthy red blood cells. This stems from a lack of a protein made in the stomach (intrinsic factor). People without this protein cannot absorb enough vitamin B12 from food.  Malabsorption. This is when the body cannot absorb the vitamin. It can be caused by:  Pernicious anemia.  Surgery to remove part or all of the stomach can lead to malabsorption. Removal of part or all of the small intestine can also cause malabsorption.  Vegetarian diet.  People who are strict about not eating foods from animals could have trouble taking in enough vitamin B12 from diet alone.  Medications. Some medicines have been linked to B12 deficiency, such as Metformin (a drug prescribed for type 2 diabetes). Long-term use of stomach acid suppressants also can keep the vitamin from being absorbed.  Intestinal problems such as inflammatory bowel disease. If there are problems in the digestive tract, vitamin B12 may not be absorbed in good enough amounts. SYMPTOMS People who do not get enough B12 can develop problems. These can include:  Anemia. This is when the body has too few red blood cells. Red blood cells carry oxygen to the rest of the body. Without a healthy supply of red blood cells, people can feel:  Tired (fatigued).  Weak.  Severe anemia can cause:  Shortness of breath.  Dizziness.  Rapid heart rate.  Paleness.  Other Vitamin B12 deficiency symptoms include:  Diarrhea.  Numbness or tingling in the hands or feet.  Loss of appetite.  Confusion.  Sores on the tongue or in the mouth. LET YOUR CAREGIVER KNOW ABOUT:  Any allergies. It is very important to know if you are allergic or sensitive to cobalt. Vitamin B12 contains cobalt.  Any history of kidney disease.  All medications you are taking. Include prescription and over-the-counter medicines, herbs and creams.  Whether you are pregnant or breast-feeding.  If you have Leber's disease, a hereditary eye condition, vitamin B12 could make it worse. RISKS AND COMPLICATIONS Reactions to an injection are   usually temporary. They might include:  Pain at the injection site.  Redness, swelling or tenderness at the site.  Headache, dizziness or weakness.  Nausea, upset stomach or diarrhea.  Numbness or tingling.  Fever.  Joint pain.  Itching or rash. If a reaction does not go away in a short while, talk with your healthcare provider. A change in the way the shots are  given, or where they are given, might need to be made. BEFORE AN INJECTION To decide whether B12 injections are right for you, your healthcare provider will probably:  Ask about your medical history.  Ask questions about your diet.  Ask about symptoms such as:  Have you felt weak?  Do you feel unusually tired?  Do you get dizzy?  Order blood tests. These may include a test to:  Check the level of red cells in your blood.  Measure B12 levels.  Check for the presence of intrinsic factor. VITAMIN B12 INJECTIONS How often you will need a vitamin B12 injection will depend on how severe your deficiency is. This also will affect how long you will need to get them. People with pernicious anemia usually get injections for their entire life. Others might get them for a shorter period. For many people, injections are given daily or weekly for several weeks. Then, once B12 levels are normal, injections are given just once a month. If the cause of the deficiency can be fixed, the injections can be stopped. Talk with your healthcare provider about what you should expect. For an injection:  The injection site will be cleaned with an alcohol swab.  Your healthcare provider will insert a needle directly into a muscle. Most any muscle can be used. Most often, an arm muscle is used. A buttocks muscle can also be used. Many people say shots in that area are less painful.  A small adhesive bandage may be put over the injection site. It usually can be taken off in an hour or less. Injections can be given by your healthcare provider. In some cases, family members give them. Sometimes, people give them to themselves. Talk with your healthcare provider about what would be best for you. If someone other than your healthcare provider will be giving the shots, the person will need to be trained to give them correctly. HOME CARE INSTRUCTIONS   You can remove the adhesive bandage within an hour of getting a  shot.  You should be able to go about your normal activities right away.  Avoid drinking large amounts of alcohol while taking vitamin B12 shots. Alcohol can interfere with the body's use of the vitamin. SEEK MEDICAL CARE IF:   Pain, redness, swelling or tenderness at the injection site does not get better or gets worse.  Headache, dizziness or weakness does not go away.  You develop a fever of more than 100.5 F (38.1 C). SEEK IMMEDIATE MEDICAL CARE IF:   You have chest pain.  You develop shortness of breath.  You have muscle weakness that gets worse.  You develop numbness, weakness or tingling on one side or one area of the body.  You have symptoms of an allergic reaction, such as:  Hives.  Difficulty breathing.  Swelling of the lips, face, tongue or throat.  You develop a fever of more than 102.0 F (38.9 C). MAKE SURE YOU:   Understand these instructions.  Will watch your condition.  Will get help right away if you are not doing well or get worse. Document   Released: 12/05/2008 Document Revised: 12/01/2011 Document Reviewed: 12/05/2008 Exit[employer] Patient Information 2014 [employer].  

## 2014-02-23 NOTE — Progress Notes (Signed)
Vitamin b12 given and tolerated well. 

## 2014-02-24 ENCOUNTER — Ambulatory Visit (INDEPENDENT_AMBULATORY_CARE_PROVIDER_SITE_OTHER): Payer: Medicare HMO

## 2014-02-24 DIAGNOSIS — E538 Deficiency of other specified B group vitamins: Secondary | ICD-10-CM

## 2014-02-27 ENCOUNTER — Ambulatory Visit (INDEPENDENT_AMBULATORY_CARE_PROVIDER_SITE_OTHER): Payer: Medicare HMO | Admitting: *Deleted

## 2014-02-27 DIAGNOSIS — E538 Deficiency of other specified B group vitamins: Secondary | ICD-10-CM

## 2014-02-27 NOTE — Patient Instructions (Signed)
Vitamin B12 Injections Every person needs vitamin B12. A deficiency develops when the body does not get enough of it. One way to overcome this is by getting B12 shots (injections). A B12 shot puts the vitamin directly into muscle tissue. This avoids any problems your body might have in absorbing it from food or a pill. In some people, the body has trouble using the vitamin correctly. This can cause a B12 deficiency. Not consuming enough of the vitamin can also cause a deficiency. Getting enough vitamin B12 can be hard for elderly people. Sometimes, they do not eat a well-balanced diet. The elderly are also more likely than younger people to have medical conditions or take medications that can lead to a deficiency. WHAT DOES VITAMIN B12 DO? Vitamin B12 does many things to help the body work right:  It helps the body make healthy red blood cells.  It helps maintain nerve cells.  It is involved in the body's process of converting food into energy (metabolism).  It is needed to make the genetic material in all cells (DNA). VITAMIN B12 FOOD SOURCES Most people get plenty of vitamin B12 through the foods they eat. It is present in:  Meat, fish, poultry, and eggs.  Milk and milk products.  It also is added when certain foods are made, including some breads, cereals and yogurts. The food is then called "fortified". CAUSES The most common causes of vitamin B12 deficiency are:  Pernicious anemia. The condition develops when the body cannot make enough healthy red blood cells. This stems from a lack of a protein made in the stomach (intrinsic factor). People without this protein cannot absorb enough vitamin B12 from food.  Malabsorption. This is when the body cannot absorb the vitamin. It can be caused by:  Pernicious anemia.  Surgery to remove part or all of the stomach can lead to malabsorption. Removal of part or all of the small intestine can also cause malabsorption.  Vegetarian diet.  People who are strict about not eating foods from animals could have trouble taking in enough vitamin B12 from diet alone.  Medications. Some medicines have been linked to B12 deficiency, such as Metformin (a drug prescribed for type 2 diabetes). Long-term use of stomach acid suppressants also can keep the vitamin from being absorbed.  Intestinal problems such as inflammatory bowel disease. If there are problems in the digestive tract, vitamin B12 may not be absorbed in good enough amounts. SYMPTOMS People who do not get enough B12 can develop problems. These can include:  Anemia. This is when the body has too few red blood cells. Red blood cells carry oxygen to the rest of the body. Without a healthy supply of red blood cells, people can feel:  Tired (fatigued).  Weak.  Severe anemia can cause:  Shortness of breath.  Dizziness.  Rapid heart rate.  Paleness.  Other Vitamin B12 deficiency symptoms include:  Diarrhea.  Numbness or tingling in the hands or feet.  Loss of appetite.  Confusion.  Sores on the tongue or in the mouth. LET YOUR CAREGIVER KNOW ABOUT:  Any allergies. It is very important to know if you are allergic or sensitive to cobalt. Vitamin B12 contains cobalt.  Any history of kidney disease.  All medications you are taking. Include prescription and over-the-counter medicines, herbs and creams.  Whether you are pregnant or breast-feeding.  If you have Leber's disease, a hereditary eye condition, vitamin B12 could make it worse. RISKS AND COMPLICATIONS Reactions to an injection are   usually temporary. They might include:  Pain at the injection site.  Redness, swelling or tenderness at the site.  Headache, dizziness or weakness.  Nausea, upset stomach or diarrhea.  Numbness or tingling.  Fever.  Joint pain.  Itching or rash. If a reaction does not go away in a short while, talk with your healthcare provider. A change in the way the shots are  given, or where they are given, might need to be made. BEFORE AN INJECTION To decide whether B12 injections are right for you, your healthcare provider will probably:  Ask about your medical history.  Ask questions about your diet.  Ask about symptoms such as:  Have you felt weak?  Do you feel unusually tired?  Do you get dizzy?  Order blood tests. These may include a test to:  Check the level of red cells in your blood.  Measure B12 levels.  Check for the presence of intrinsic factor. VITAMIN B12 INJECTIONS How often you will need a vitamin B12 injection will depend on how severe your deficiency is. This also will affect how long you will need to get them. People with pernicious anemia usually get injections for their entire life. Others might get them for a shorter period. For many people, injections are given daily or weekly for several weeks. Then, once B12 levels are normal, injections are given just once a month. If the cause of the deficiency can be fixed, the injections can be stopped. Talk with your healthcare provider about what you should expect. For an injection:  The injection site will be cleaned with an alcohol swab.  Your healthcare provider will insert a needle directly into a muscle. Most any muscle can be used. Most often, an arm muscle is used. A buttocks muscle can also be used. Many people say shots in that area are less painful.  A small adhesive bandage may be put over the injection site. It usually can be taken off in an hour or less. Injections can be given by your healthcare provider. In some cases, family members give them. Sometimes, people give them to themselves. Talk with your healthcare provider about what would be best for you. If someone other than your healthcare provider will be giving the shots, the person will need to be trained to give them correctly. HOME CARE INSTRUCTIONS   You can remove the adhesive bandage within an hour of getting a  shot.  You should be able to go about your normal activities right away.  Avoid drinking large amounts of alcohol while taking vitamin B12 shots. Alcohol can interfere with the body's use of the vitamin. SEEK MEDICAL CARE IF:   Pain, redness, swelling or tenderness at the injection site does not get better or gets worse.  Headache, dizziness or weakness does not go away.  You develop a fever of more than 100.5 F (38.1 C). SEEK IMMEDIATE MEDICAL CARE IF:   You have chest pain.  You develop shortness of breath.  You have muscle weakness that gets worse.  You develop numbness, weakness or tingling on one side or one area of the body.  You have symptoms of an allergic reaction, such as:  Hives.  Difficulty breathing.  Swelling of the lips, face, tongue or throat.  You develop a fever of more than 102.0 F (38.9 C). MAKE SURE YOU:   Understand these instructions.  Will watch your condition.  Will get help right away if you are not doing well or get worse. Document   Released: 12/05/2008 Document Revised: 12/01/2011 Document Reviewed: 12/05/2008 Exit[employer] Patient Information 2014 [employer].  

## 2014-02-27 NOTE — Progress Notes (Signed)
Vitamin b12 injection tolerated well

## 2014-02-28 ENCOUNTER — Ambulatory Visit (INDEPENDENT_AMBULATORY_CARE_PROVIDER_SITE_OTHER): Payer: Medicare HMO | Admitting: *Deleted

## 2014-02-28 DIAGNOSIS — E538 Deficiency of other specified B group vitamins: Secondary | ICD-10-CM

## 2014-02-28 NOTE — Patient Instructions (Signed)
Vitamin B12 Injections Every person needs vitamin B12. A deficiency develops when the body does not get enough of it. One way to overcome this is by getting B12 shots (injections). A B12 shot puts the vitamin directly into muscle tissue. This avoids any problems your body might have in absorbing it from food or a pill. In some people, the body has trouble using the vitamin correctly. This can cause a B12 deficiency. Not consuming enough of the vitamin can also cause a deficiency. Getting enough vitamin B12 can be hard for elderly people. Sometimes, they do not eat a well-balanced diet. The elderly are also more likely than younger people to have medical conditions or take medications that can lead to a deficiency. WHAT DOES VITAMIN B12 DO? Vitamin B12 does many things to help the body work right:  It helps the body make healthy red blood cells.  It helps maintain nerve cells.  It is involved in the body's process of converting food into energy (metabolism).  It is needed to make the genetic material in all cells (DNA). VITAMIN B12 FOOD SOURCES Most people get plenty of vitamin B12 through the foods they eat. It is present in:  Meat, fish, poultry, and eggs.  Milk and milk products.  It also is added when certain foods are made, including some breads, cereals and yogurts. The food is then called "fortified". CAUSES The most common causes of vitamin B12 deficiency are:  Pernicious anemia. The condition develops when the body cannot make enough healthy red blood cells. This stems from a lack of a protein made in the stomach (intrinsic factor). People without this protein cannot absorb enough vitamin B12 from food.  Malabsorption. This is when the body cannot absorb the vitamin. It can be caused by:  Pernicious anemia.  Surgery to remove part or all of the stomach can lead to malabsorption. Removal of part or all of the small intestine can also cause malabsorption.  Vegetarian diet.  People who are strict about not eating foods from animals could have trouble taking in enough vitamin B12 from diet alone.  Medications. Some medicines have been linked to B12 deficiency, such as Metformin (a drug prescribed for type 2 diabetes). Long-term use of stomach acid suppressants also can keep the vitamin from being absorbed.  Intestinal problems such as inflammatory bowel disease. If there are problems in the digestive tract, vitamin B12 may not be absorbed in good enough amounts. SYMPTOMS People who do not get enough B12 can develop problems. These can include:  Anemia. This is when the body has too few red blood cells. Red blood cells carry oxygen to the rest of the body. Without a healthy supply of red blood cells, people can feel:  Tired (fatigued).  Weak.  Severe anemia can cause:  Shortness of breath.  Dizziness.  Rapid heart rate.  Paleness.  Other Vitamin B12 deficiency symptoms include:  Diarrhea.  Numbness or tingling in the hands or feet.  Loss of appetite.  Confusion.  Sores on the tongue or in the mouth. LET YOUR CAREGIVER KNOW ABOUT:  Any allergies. It is very important to know if you are allergic or sensitive to cobalt. Vitamin B12 contains cobalt.  Any history of kidney disease.  All medications you are taking. Include prescription and over-the-counter medicines, herbs and creams.  Whether you are pregnant or breast-feeding.  If you have Leber's disease, a hereditary eye condition, vitamin B12 could make it worse. RISKS AND COMPLICATIONS Reactions to an injection are   usually temporary. They might include:  Pain at the injection site.  Redness, swelling or tenderness at the site.  Headache, dizziness or weakness.  Nausea, upset stomach or diarrhea.  Numbness or tingling.  Fever.  Joint pain.  Itching or rash. If a reaction does not go away in a short while, talk with your healthcare provider. A change in the way the shots are  given, or where they are given, might need to be made. BEFORE AN INJECTION To decide whether B12 injections are right for you, your healthcare provider will probably:  Ask about your medical history.  Ask questions about your diet.  Ask about symptoms such as:  Have you felt weak?  Do you feel unusually tired?  Do you get dizzy?  Order blood tests. These may include a test to:  Check the level of red cells in your blood.  Measure B12 levels.  Check for the presence of intrinsic factor. VITAMIN B12 INJECTIONS How often you will need a vitamin B12 injection will depend on how severe your deficiency is. This also will affect how long you will need to get them. People with pernicious anemia usually get injections for their entire life. Others might get them for a shorter period. For many people, injections are given daily or weekly for several weeks. Then, once B12 levels are normal, injections are given just once a month. If the cause of the deficiency can be fixed, the injections can be stopped. Talk with your healthcare provider about what you should expect. For an injection:  The injection site will be cleaned with an alcohol swab.  Your healthcare provider will insert a needle directly into a muscle. Most any muscle can be used. Most often, an arm muscle is used. A buttocks muscle can also be used. Many people say shots in that area are less painful.  A small adhesive bandage may be put over the injection site. It usually can be taken off in an hour or less. Injections can be given by your healthcare provider. In some cases, family members give them. Sometimes, people give them to themselves. Talk with your healthcare provider about what would be best for you. If someone other than your healthcare provider will be giving the shots, the person will need to be trained to give them correctly. HOME CARE INSTRUCTIONS   You can remove the adhesive bandage within an hour of getting a  shot.  You should be able to go about your normal activities right away.  Avoid drinking large amounts of alcohol while taking vitamin B12 shots. Alcohol can interfere with the body's use of the vitamin. SEEK MEDICAL CARE IF:   Pain, redness, swelling or tenderness at the injection site does not get better or gets worse.  Headache, dizziness or weakness does not go away.  You develop a fever of more than 100.5 F (38.1 C). SEEK IMMEDIATE MEDICAL CARE IF:   You have chest pain.  You develop shortness of breath.  You have muscle weakness that gets worse.  You develop numbness, weakness or tingling on one side or one area of the body.  You have symptoms of an allergic reaction, such as:  Hives.  Difficulty breathing.  Swelling of the lips, face, tongue or throat.  You develop a fever of more than 102.0 F (38.9 C). MAKE SURE YOU:   Understand these instructions.  Will watch your condition.  Will get help right away if you are not doing well or get worse. Document   Released: 12/05/2008 Document Revised: 12/01/2011 Document Reviewed: 12/05/2008 Exit[employer] Patient Information 2014 [employer].  

## 2014-02-28 NOTE — Progress Notes (Signed)
Vitamin b12 injection given and tolerated well.  

## 2014-03-01 ENCOUNTER — Encounter: Payer: Self-pay | Admitting: Family Medicine

## 2014-03-01 ENCOUNTER — Ambulatory Visit (INDEPENDENT_AMBULATORY_CARE_PROVIDER_SITE_OTHER): Payer: Medicare HMO | Admitting: Family Medicine

## 2014-03-01 ENCOUNTER — Ambulatory Visit (INDEPENDENT_AMBULATORY_CARE_PROVIDER_SITE_OTHER): Payer: Medicare HMO

## 2014-03-01 VITALS — BP 142/83 | HR 76 | Temp 98.0°F | Ht 65.0 in | Wt 182.4 lb

## 2014-03-01 DIAGNOSIS — I059 Rheumatic mitral valve disease, unspecified: Secondary | ICD-10-CM

## 2014-03-01 DIAGNOSIS — R0789 Other chest pain: Secondary | ICD-10-CM

## 2014-03-01 DIAGNOSIS — R0602 Shortness of breath: Secondary | ICD-10-CM

## 2014-03-01 DIAGNOSIS — R5383 Other fatigue: Principal | ICD-10-CM

## 2014-03-01 DIAGNOSIS — I341 Nonrheumatic mitral (valve) prolapse: Secondary | ICD-10-CM

## 2014-03-01 DIAGNOSIS — R5381 Other malaise: Secondary | ICD-10-CM

## 2014-03-01 NOTE — Progress Notes (Signed)
   Subjective:    Patient ID: Sabrina [name], female    DOB: [birth year], 68 y.o.   MRN: [id number]  HPI This 68 y.o. female presents for evaluation of follow up on her CT of the neck.  She has had a URI And she had some LAD she was concerned was possible cancer so a CT of the neck was ordered and the results were normal and she is feeling better.  She has b12 deficiency and she is getting b12 injections.  She has fatigue.  She has hx of CHF and she states it was due to mitral valve prolapse. She has not seen a cardiologist in 15 years.  She wants to see a cardiologist in [place].  She has SOB and chest pain.  She has family hx of CAD (mother)..   Review of Systems C/o chest pain and SOB No chest pain, SOB, HA, dizziness, vision change, N/V, diarrhea, constipation, dysuria, urinary urgency or frequency, myalgias, arthralgias or rash.     Objective:   Physical Exam Vital signs noted  Well developed well nourished female.  HEENT - Head atraumatic Normocephalic                Eyes - PERRLA, Conjuctiva - clear Sclera- Clear EOMI                Ears - EAC's Wnl TM's Wnl Gross Hearing WNL                Nose - Nares patent                 Throat - oropharanx wnl Respiratory - Lungs CTA bilateral Cardiac - RRR S1 and S2 without murmur GI - Abdomen soft Nontender and bowel sounds active x 4 Extremities - No edema. Neuro - Grossly intact.  EKG - NSR with q waves in lead III and AVF. Lysbeth Penner FNP     Assessment & Plan:  Other malaise and fatigue - Plan: Ambulatory referral to Cardiology  MVP (mitral valve prolapse) - Plan: Ambulatory referral to Cardiology  Other chest pain - Plan: Ambulatory referral to Cardiology, EKG 12-Lead, DG Chest 2 View  SOB (shortness of breath) - Plan: EKG 12-Lead, DG Chest 2 View  Lysbeth Penner FNP

## 2014-03-03 ENCOUNTER — Ambulatory Visit: Payer: Medicare HMO | Admitting: Family Medicine

## 2014-03-07 ENCOUNTER — Ambulatory Visit (INDEPENDENT_AMBULATORY_CARE_PROVIDER_SITE_OTHER): Payer: Medicare HMO | Admitting: *Deleted

## 2014-03-07 DIAGNOSIS — E538 Deficiency of other specified B group vitamins: Secondary | ICD-10-CM

## 2014-03-07 MED ORDER — CYANOCOBALAMIN 1000 MCG/ML IJ SOLN
1000.0000 ug | INTRAMUSCULAR | Status: DC
  Administered 2014-03-07 – 2014-05-04 (×2): 1000 ug via INTRAMUSCULAR

## 2014-03-07 NOTE — Progress Notes (Signed)
Patient ID: Sabrina [name], female   DOB: 24-Sep-1945, 68 y.o.   MRN: [id number] Pt tolerated inj well

## 2014-03-14 ENCOUNTER — Ambulatory Visit (INDEPENDENT_AMBULATORY_CARE_PROVIDER_SITE_OTHER): Payer: Medicare HMO | Admitting: *Deleted

## 2014-03-14 DIAGNOSIS — E538 Deficiency of other specified B group vitamins: Secondary | ICD-10-CM

## 2014-03-14 MED ORDER — CYANOCOBALAMIN 1000 MCG/ML IJ SOLN
1000.0000 ug | INTRAMUSCULAR | Status: AC
  Administered 2014-03-14 – 2014-03-21 (×2): 1000 ug via INTRAMUSCULAR

## 2014-03-14 NOTE — Patient Instructions (Signed)
Vitamin B12 Injections Every person needs vitamin B12. A deficiency develops when the body does not get enough of it. One way to overcome this is by getting B12 shots (injections). A B12 shot puts the vitamin directly into muscle tissue. This avoids any problems your body might have in absorbing it from food or a pill. In some people, the body has trouble using the vitamin correctly. This can cause a B12 deficiency. Not consuming enough of the vitamin can also cause a deficiency. Getting enough vitamin B12 can be hard for elderly people. Sometimes, they do not eat a well-balanced diet. The elderly are also more likely than younger people to have medical conditions or take medications that can lead to a deficiency. WHAT DOES VITAMIN B12 DO? Vitamin B12 does many things to help the body work right:  It helps the body make healthy red blood cells.  It helps maintain nerve cells.  It is involved in the body's process of converting food into energy (metabolism).  It is needed to make the genetic material in all cells (DNA). VITAMIN B12 FOOD SOURCES Most people get plenty of vitamin B12 through the foods they eat. It is present in:  Meat, fish, poultry, and eggs.  Milk and milk products.  It also is added when certain foods are made, including some breads, cereals and yogurts. The food is then called "fortified". CAUSES The most common causes of vitamin B12 deficiency are:  Pernicious anemia. The condition develops when the body cannot make enough healthy red blood cells. This stems from a lack of a protein made in the stomach (intrinsic factor). People without this protein cannot absorb enough vitamin B12 from food.  Malabsorption. This is when the body cannot absorb the vitamin. It can be caused by:  Pernicious anemia.  Surgery to remove part or all of the stomach can lead to malabsorption. Removal of part or all of the small intestine can also cause malabsorption.  Vegetarian diet.  People who are strict about not eating foods from animals could have trouble taking in enough vitamin B12 from diet alone.  Medications. Some medicines have been linked to B12 deficiency, such as Metformin (a drug prescribed for type 2 diabetes). Long-term use of stomach acid suppressants also can keep the vitamin from being absorbed.  Intestinal problems such as inflammatory bowel disease. If there are problems in the digestive tract, vitamin B12 may not be absorbed in good enough amounts. SYMPTOMS People who do not get enough B12 can develop problems. These can include:  Anemia. This is when the body has too few red blood cells. Red blood cells carry oxygen to the rest of the body. Without a healthy supply of red blood cells, people can feel:  Tired (fatigued).  Weak.  Severe anemia can cause:  Shortness of breath.  Dizziness.  Rapid heart rate.  Paleness.  Other Vitamin B12 deficiency symptoms include:  Diarrhea.  Numbness or tingling in the hands or feet.  Loss of appetite.  Confusion.  Sores on the tongue or in the mouth. LET YOUR CAREGIVER KNOW ABOUT:  Any allergies. It is very important to know if you are allergic or sensitive to cobalt. Vitamin B12 contains cobalt.  Any history of kidney disease.  All medications you are taking. Include prescription and over-the-counter medicines, herbs and creams.  Whether you are pregnant or breast-feeding.  If you have Leber's disease, a hereditary eye condition, vitamin B12 could make it worse. RISKS AND COMPLICATIONS Reactions to an injection are   usually temporary. They might include:  Pain at the injection site.  Redness, swelling or tenderness at the site.  Headache, dizziness or weakness.  Nausea, upset stomach or diarrhea.  Numbness or tingling.  Fever.  Joint pain.  Itching or rash. If a reaction does not go away in a short while, talk with your healthcare provider. A change in the way the shots are  given, or where they are given, might need to be made. BEFORE AN INJECTION To decide whether B12 injections are right for you, your healthcare provider will probably:  Ask about your medical history.  Ask questions about your diet.  Ask about symptoms such as:  Have you felt weak?  Do you feel unusually tired?  Do you get dizzy?  Order blood tests. These may include a test to:  Check the level of red cells in your blood.  Measure B12 levels.  Check for the presence of intrinsic factor. VITAMIN B12 INJECTIONS How often you will need a vitamin B12 injection will depend on how severe your deficiency is. This also will affect how long you will need to get them. People with pernicious anemia usually get injections for their entire life. Others might get them for a shorter period. For many people, injections are given daily or weekly for several weeks. Then, once B12 levels are normal, injections are given just once a month. If the cause of the deficiency can be fixed, the injections can be stopped. Talk with your healthcare provider about what you should expect. For an injection:  The injection site will be cleaned with an alcohol swab.  Your healthcare provider will insert a needle directly into a muscle. Most any muscle can be used. Most often, an arm muscle is used. A buttocks muscle can also be used. Many people say shots in that area are less painful.  A small adhesive bandage may be put over the injection site. It usually can be taken off in an hour or less. Injections can be given by your healthcare provider. In some cases, family members give them. Sometimes, people give them to themselves. Talk with your healthcare provider about what would be best for you. If someone other than your healthcare provider will be giving the shots, the person will need to be trained to give them correctly. HOME CARE INSTRUCTIONS   You can remove the adhesive bandage within an hour of getting a  shot.  You should be able to go about your normal activities right away.  Avoid drinking large amounts of alcohol while taking vitamin B12 shots. Alcohol can interfere with the body's use of the vitamin. SEEK MEDICAL CARE IF:   Pain, redness, swelling or tenderness at the injection site does not get better or gets worse.  Headache, dizziness or weakness does not go away.  You develop a fever of more than 100.5 F (38.1 C). SEEK IMMEDIATE MEDICAL CARE IF:   You have chest pain.  You develop shortness of breath.  You have muscle weakness that gets worse.  You develop numbness, weakness or tingling on one side or one area of the body.  You have symptoms of an allergic reaction, such as:  Hives.  Difficulty breathing.  Swelling of the lips, face, tongue or throat.  You develop a fever of more than 102.0 F (38.9 C). MAKE SURE YOU:   Understand these instructions.  Will watch your condition.  Will get help right away if you are not doing well or get worse. Document   Released: 12/05/2008 Document Revised: 12/01/2011 Document Reviewed: 12/05/2008 ExitCare Patient Information 2015 [employer]. This information is not intended to replace advice given to you by your health care provider. Make sure you discuss any questions you have with your health care provider.  

## 2014-03-14 NOTE — Progress Notes (Signed)
Vitamin b12 given and tolerated well. 

## 2014-03-20 ENCOUNTER — Ambulatory Visit: Payer: Medicare HMO

## 2014-03-21 ENCOUNTER — Ambulatory Visit (INDEPENDENT_AMBULATORY_CARE_PROVIDER_SITE_OTHER): Payer: Medicare HMO | Admitting: *Deleted

## 2014-03-21 DIAGNOSIS — E538 Deficiency of other specified B group vitamins: Secondary | ICD-10-CM

## 2014-03-21 NOTE — Progress Notes (Signed)
Vitamin b12 given and tolerated well. 

## 2014-03-21 NOTE — Patient Instructions (Signed)
Vitamin B12 Injections Every person needs vitamin B12. A deficiency develops when the body does not get enough of it. One way to overcome this is by getting B12 shots (injections). A B12 shot puts the vitamin directly into muscle tissue. This avoids any problems your body might have in absorbing it from food or a pill. In some people, the body has trouble using the vitamin correctly. This can cause a B12 deficiency. Not consuming enough of the vitamin can also cause a deficiency. Getting enough vitamin B12 can be hard for elderly people. Sometimes, they do not eat a well-balanced diet. The elderly are also more likely than younger people to have medical conditions or take medications that can lead to a deficiency. WHAT DOES VITAMIN B12 DO? Vitamin B12 does many things to help the body work right:  It helps the body make healthy red blood cells.  It helps maintain nerve cells.  It is involved in the body's process of converting food into energy (metabolism).  It is needed to make the genetic material in all cells (DNA). VITAMIN B12 FOOD SOURCES Most people get plenty of vitamin B12 through the foods they eat. It is present in:  Meat, fish, poultry, and eggs.  Milk and milk products.  It also is added when certain foods are made, including some breads, cereals and yogurts. The food is then called "fortified". CAUSES The most common causes of vitamin B12 deficiency are:  Pernicious anemia. The condition develops when the body cannot make enough healthy red blood cells. This stems from a lack of a protein made in the stomach (intrinsic factor). People without this protein cannot absorb enough vitamin B12 from food.  Malabsorption. This is when the body cannot absorb the vitamin. It can be caused by:  Pernicious anemia.  Surgery to remove part or all of the stomach can lead to malabsorption. Removal of part or all of the small intestine can also cause malabsorption.  Vegetarian diet.  People who are strict about not eating foods from animals could have trouble taking in enough vitamin B12 from diet alone.  Medications. Some medicines have been linked to B12 deficiency, such as Metformin (a drug prescribed for type 2 diabetes). Long-term use of stomach acid suppressants also can keep the vitamin from being absorbed.  Intestinal problems such as inflammatory bowel disease. If there are problems in the digestive tract, vitamin B12 may not be absorbed in good enough amounts. SYMPTOMS People who do not get enough B12 can develop problems. These can include:  Anemia. This is when the body has too few red blood cells. Red blood cells carry oxygen to the rest of the body. Without a healthy supply of red blood cells, people can feel:  Tired (fatigued).  Weak.  Severe anemia can cause:  Shortness of breath.  Dizziness.  Rapid heart rate.  Paleness.  Other Vitamin B12 deficiency symptoms include:  Diarrhea.  Numbness or tingling in the hands or feet.  Loss of appetite.  Confusion.  Sores on the tongue or in the mouth. LET YOUR CAREGIVER KNOW ABOUT:  Any allergies. It is very important to know if you are allergic or sensitive to cobalt. Vitamin B12 contains cobalt.  Any history of kidney disease.  All medications you are taking. Include prescription and over-the-counter medicines, herbs and creams.  Whether you are pregnant or breast-feeding.  If you have Leber's disease, a hereditary eye condition, vitamin B12 could make it worse. RISKS AND COMPLICATIONS Reactions to an injection are   usually temporary. They might include:  Pain at the injection site.  Redness, swelling or tenderness at the site.  Headache, dizziness or weakness.  Nausea, upset stomach or diarrhea.  Numbness or tingling.  Fever.  Joint pain.  Itching or rash. If a reaction does not go away in a short while, talk with your healthcare provider. A change in the way the shots are  given, or where they are given, might need to be made. BEFORE AN INJECTION To decide whether B12 injections are right for you, your healthcare provider will probably:  Ask about your medical history.  Ask questions about your diet.  Ask about symptoms such as:  Have you felt weak?  Do you feel unusually tired?  Do you get dizzy?  Order blood tests. These may include a test to:  Check the level of red cells in your blood.  Measure B12 levels.  Check for the presence of intrinsic factor. VITAMIN B12 INJECTIONS How often you will need a vitamin B12 injection will depend on how severe your deficiency is. This also will affect how long you will need to get them. People with pernicious anemia usually get injections for their entire life. Others might get them for a shorter period. For many people, injections are given daily or weekly for several weeks. Then, once B12 levels are normal, injections are given just once a month. If the cause of the deficiency can be fixed, the injections can be stopped. Talk with your healthcare provider about what you should expect. For an injection:  The injection site will be cleaned with an alcohol swab.  Your healthcare provider will insert a needle directly into a muscle. Most any muscle can be used. Most often, an arm muscle is used. A buttocks muscle can also be used. Many people say shots in that area are less painful.  A small adhesive bandage may be put over the injection site. It usually can be taken off in an hour or less. Injections can be given by your healthcare provider. In some cases, family members give them. Sometimes, people give them to themselves. Talk with your healthcare provider about what would be best for you. If someone other than your healthcare provider will be giving the shots, the person will need to be trained to give them correctly. HOME CARE INSTRUCTIONS   You can remove the adhesive bandage within an hour of getting a  shot.  You should be able to go about your normal activities right away.  Avoid drinking large amounts of alcohol while taking vitamin B12 shots. Alcohol can interfere with the body's use of the vitamin. SEEK MEDICAL CARE IF:   Pain, redness, swelling or tenderness at the injection site does not get better or gets worse.  Headache, dizziness or weakness does not go away.  You develop a fever of more than 100.5 F (38.1 C). SEEK IMMEDIATE MEDICAL CARE IF:   You have chest pain.  You develop shortness of breath.  You have muscle weakness that gets worse.  You develop numbness, weakness or tingling on one side or one area of the body.  You have symptoms of an allergic reaction, such as:  Hives.  Difficulty breathing.  Swelling of the lips, face, tongue or throat.  You develop a fever of more than 102.0 F (38.9 C). MAKE SURE YOU:   Understand these instructions.  Will watch your condition.  Will get help right away if you are not doing well or get worse. Document   Released: 12/05/2008 Document Revised: 12/01/2011 Document Reviewed: 12/05/2008 ExitCare Patient Information 2015 [employer]. This information is not intended to replace advice given to you by your health care provider. Make sure you discuss any questions you have with your health care provider.  

## 2014-03-27 ENCOUNTER — Encounter: Payer: Self-pay | Admitting: Internal Medicine

## 2014-03-27 ENCOUNTER — Ambulatory Visit (INDEPENDENT_AMBULATORY_CARE_PROVIDER_SITE_OTHER): Payer: Commercial Managed Care - HMO | Admitting: Internal Medicine

## 2014-03-27 VITALS — BP 132/90 | HR 49 | Ht 64.0 in | Wt 184.0 lb

## 2014-03-27 DIAGNOSIS — R0602 Shortness of breath: Secondary | ICD-10-CM | POA: Insufficient documentation

## 2014-03-27 DIAGNOSIS — R0789 Other chest pain: Secondary | ICD-10-CM | POA: Insufficient documentation

## 2014-03-27 DIAGNOSIS — I509 Heart failure, unspecified: Secondary | ICD-10-CM

## 2014-03-27 DIAGNOSIS — I5032 Chronic diastolic (congestive) heart failure: Secondary | ICD-10-CM

## 2014-03-27 NOTE — Progress Notes (Signed)
      HPI [name] is referred today by [facility] for evaluation of chest pain and sob. She was diagnosed with MVP approx. 20 yrs ago when she presented with CHF. She stopped smoking approx. 15 yrs ago. She has a family h/o CAD with mother having multiple stents and cardiac procedures. She has intermittent peripheral edema and sob with exertion. She has also describes chest heaviness. No nause or vomiting. Her chest heaviness resolves with rest.  Allergies  Allergen Reactions  . Codeine Nausea And Vomiting     Current Outpatient Prescriptions  Medication Sig Dispense Refill  . aspirin 81 MG tablet Take 81 mg by mouth daily.      . cyanocobalamin (,VITAMIN B-12,) 1000 MCG/ML injection Inject one ml IM daily for a week then weekly for 4 weeks then monthly  10 mL  3  . DULoxetine (CYMBALTA) 30 MG capsule Take 1 capsule (30 mg total) by mouth daily.  30 capsule  11  . ferrous sulfate 325 (65 FE) MG EC tablet Take 1 tablet (325 mg total) by mouth daily with breakfast.  30 tablet  11  . meloxicam (MOBIC) 15 MG tablet Take 1 tablet (15 mg total) by mouth daily.  30 tablet  11  . traZODone (DESYREL) 50 MG tablet Take 1 tablet (50 mg total) by mouth at bedtime.  30 tablet  3   Current Facility-Administered Medications  Medication Dose Route Frequency Provider Last Rate Last Dose  . cyanocobalamin ((VITAMIN B-12)) injection 1,000 mcg  1,000 mcg Intramuscular Q30 days Sabrina Penner, FNP   1,000 mcg at 03/07/14 1448     Past Medical History  Diagnosis Date  . Depression     ROS:   All systems reviewed and negative except as noted in the HPI.   Past Surgical History  Procedure Laterality Date  . Abdominal hysterectomy    . Appendectomy    . Tonsillectomy    . Cholecystectomy    . Gastric bypass       Family History  Problem Relation Age of Onset  . Cancer Mother   . Alzheimer's disease Mother   . Arthritis Father      History   Social History  . Marital Status: Legally  Separated    Spouse Name: N/A    Number of Children: N/A  . Years of Education: N/A   Occupational History  . Not on file.   Social History Main Topics  . Smoking status: Former Smoker    Quit date: 02/26/1993  . Smokeless tobacco: Not on file  . Alcohol Use: Not on file  . Drug Use: Not on file  . Sexual Activity: Not on file   Other Topics Concern  . Not on file   Social History Narrative  . No narrative on file     BP 132/90  Pulse 49  Ht 5\' 4"  (1.626 m)  Wt 184 lb (83.462 kg)  BMI 31.57 kg/m2  SpO2 99%  Physical Exam:  Well appearing middle aged woman, NAD HEENT: Unremarkable Neck:  No JVD, no thyromegally Back:  No CVA tenderness Lungs:  Clear with no wheezes HEART:  Regular rate rhythm, no murmurs, no rubs, no clicks Abd:  soft, positive bowel sounds, no organomegally, no rebound, no guarding Ext:  2 plus pulses, no edema, no cyanosis, no clubbing Skin:  No rashes no nodules Neuro:  CN II through XII intact, motor grossly intact  EKG - nsr   Assess/Plan:

## 2014-03-27 NOTE — Patient Instructions (Signed)
Your physician has requested that you have an echocardiogram. Echocardiography is a painless test that uses sound waves to create images of your heart. It provides your doctor with information about the size and shape of your heart and how well your heart's chambers and valves are working. This procedure takes approximately one hour. There are no restrictions for this procedure.  Your physician has requested that you have an exercise tolerance test. For further information please visit [email or web address]. Please also follow instruction sheet, as given.  Your physician recommends that you schedule a follow-up to be determined.

## 2014-03-27 NOTE — Assessment & Plan Note (Signed)
The patient has a h/o MVP and intermittant peripheral edema. She describes fatigue, lack of energy and dyspnea with exertion. I asked her to reduce her sodium intake. She will undergo 2D echo.

## 2014-03-27 NOTE — Assessment & Plan Note (Signed)
The etiology of her symptoms is unclear but there is an exertional component. I have asked the patient to undergo an exercise test.

## 2014-03-28 ENCOUNTER — Ambulatory Visit (INDEPENDENT_AMBULATORY_CARE_PROVIDER_SITE_OTHER): Payer: Medicare HMO | Admitting: *Deleted

## 2014-03-28 DIAGNOSIS — E538 Deficiency of other specified B group vitamins: Secondary | ICD-10-CM

## 2014-03-28 MED ORDER — CYANOCOBALAMIN 1000 MCG/ML IJ SOLN
1000.0000 ug | Freq: Once | INTRAMUSCULAR | Status: AC
  Administered 2014-03-28: 1000 ug via INTRAMUSCULAR

## 2014-03-28 NOTE — Patient Instructions (Signed)
Vitamin B12 Injections Every person needs vitamin B12. A deficiency develops when the body does not get enough of it. One way to overcome this is by getting B12 shots (injections). A B12 shot puts the vitamin directly into muscle tissue. This avoids any problems your body might have in absorbing it from food or a pill. In some people, the body has trouble using the vitamin correctly. This can cause a B12 deficiency. Not consuming enough of the vitamin can also cause a deficiency. Getting enough vitamin B12 can be hard for elderly people. Sometimes, they do not eat a well-balanced diet. The elderly are also more likely than younger people to have medical conditions or take medications that can lead to a deficiency. WHAT DOES VITAMIN B12 DO? Vitamin B12 does many things to help the body work right:  It helps the body make healthy red blood cells.  It helps maintain nerve cells.  It is involved in the body's process of converting food into energy (metabolism).  It is needed to make the genetic material in all cells (DNA). VITAMIN B12 FOOD SOURCES Most people get plenty of vitamin B12 through the foods they eat. It is present in:  Meat, fish, poultry, and eggs.  Milk and milk products.  It also is added when certain foods are made, including some breads, cereals and yogurts. The food is then called "fortified". CAUSES The most common causes of vitamin B12 deficiency are:  Pernicious anemia. The condition develops when the body cannot make enough healthy red blood cells. This stems from a lack of a protein made in the stomach (intrinsic factor). People without this protein cannot absorb enough vitamin B12 from food.  Malabsorption. This is when the body cannot absorb the vitamin. It can be caused by:  Pernicious anemia.  Surgery to remove part or all of the stomach can lead to malabsorption. Removal of part or all of the small intestine can also cause malabsorption.  Vegetarian diet.  People who are strict about not eating foods from animals could have trouble taking in enough vitamin B12 from diet alone.  Medications. Some medicines have been linked to B12 deficiency, such as Metformin (a drug prescribed for type 2 diabetes). Long-term use of stomach acid suppressants also can keep the vitamin from being absorbed.  Intestinal problems such as inflammatory bowel disease. If there are problems in the digestive tract, vitamin B12 may not be absorbed in good enough amounts. SYMPTOMS People who do not get enough B12 can develop problems. These can include:  Anemia. This is when the body has too few red blood cells. Red blood cells carry oxygen to the rest of the body. Without a healthy supply of red blood cells, people can feel:  Tired (fatigued).  Weak.  Severe anemia can cause:  Shortness of breath.  Dizziness.  Rapid heart rate.  Paleness.  Other Vitamin B12 deficiency symptoms include:  Diarrhea.  Numbness or tingling in the hands or feet.  Loss of appetite.  Confusion.  Sores on the tongue or in the mouth. LET YOUR CAREGIVER KNOW ABOUT:  Any allergies. It is very important to know if you are allergic or sensitive to cobalt. Vitamin B12 contains cobalt.  Any history of kidney disease.  All medications you are taking. Include prescription and over-the-counter medicines, herbs and creams.  Whether you are pregnant or breast-feeding.  If you have Leber's disease, a hereditary eye condition, vitamin B12 could make it worse. RISKS AND COMPLICATIONS Reactions to an injection are   usually temporary. They might include:  Pain at the injection site.  Redness, swelling or tenderness at the site.  Headache, dizziness or weakness.  Nausea, upset stomach or diarrhea.  Numbness or tingling.  Fever.  Joint pain.  Itching or rash. If a reaction does not go away in a short while, talk with your healthcare provider. A change in the way the shots are  given, or where they are given, might need to be made. BEFORE AN INJECTION To decide whether B12 injections are right for you, your healthcare provider will probably:  Ask about your medical history.  Ask questions about your diet.  Ask about symptoms such as:  Have you felt weak?  Do you feel unusually tired?  Do you get dizzy?  Order blood tests. These may include a test to:  Check the level of red cells in your blood.  Measure B12 levels.  Check for the presence of intrinsic factor. VITAMIN B12 INJECTIONS How often you will need a vitamin B12 injection will depend on how severe your deficiency is. This also will affect how long you will need to get them. People with pernicious anemia usually get injections for their entire life. Others might get them for a shorter period. For many people, injections are given daily or weekly for several weeks. Then, once B12 levels are normal, injections are given just once a month. If the cause of the deficiency can be fixed, the injections can be stopped. Talk with your healthcare provider about what you should expect. For an injection:  The injection site will be cleaned with an alcohol swab.  Your healthcare provider will insert a needle directly into a muscle. Most any muscle can be used. Most often, an arm muscle is used. A buttocks muscle can also be used. Many people say shots in that area are less painful.  A small adhesive bandage may be put over the injection site. It usually can be taken off in an hour or less. Injections can be given by your healthcare provider. In some cases, family members give them. Sometimes, people give them to themselves. Talk with your healthcare provider about what would be best for you. If someone other than your healthcare provider will be giving the shots, the person will need to be trained to give them correctly. HOME CARE INSTRUCTIONS   You can remove the adhesive bandage within an hour of getting a  shot.  You should be able to go about your normal activities right away.  Avoid drinking large amounts of alcohol while taking vitamin B12 shots. Alcohol can interfere with the body's use of the vitamin. SEEK MEDICAL CARE IF:   Pain, redness, swelling or tenderness at the injection site does not get better or gets worse.  Headache, dizziness or weakness does not go away.  You develop a fever of more than 100.5 F (38.1 C). SEEK IMMEDIATE MEDICAL CARE IF:   You have chest pain.  You develop shortness of breath.  You have muscle weakness that gets worse.  You develop numbness, weakness or tingling on one side or one area of the body.  You have symptoms of an allergic reaction, such as:  Hives.  Difficulty breathing.  Swelling of the lips, face, tongue or throat.  You develop a fever of more than 102.0 F (38.9 C). MAKE SURE YOU:   Understand these instructions.  Will watch your condition.  Will get help right away if you are not doing well or get worse. Document   Released: 12/05/2008 Document Revised: 12/01/2011 Document Reviewed: 12/05/2008 ExitCare Patient Information 2015 [employer]. This information is not intended to replace advice given to you by your health care provider. Make sure you discuss any questions you have with your health care provider.  

## 2014-03-28 NOTE — Progress Notes (Signed)
Vitamin b12 given and tolerated well. 

## 2014-03-31 ENCOUNTER — Ambulatory Visit (HOSPITAL_COMMUNITY)
Admission: RE | Admit: 2014-03-31 | Discharge: 2014-03-31 | Disposition: A | Discharge status: Home / Self Care | Payer: Medicare HMO | Source: Ambulatory Visit | Attending: Internal Medicine | Admitting: Internal Medicine

## 2014-03-31 DIAGNOSIS — I5032 Chronic diastolic (congestive) heart failure: Secondary | ICD-10-CM | POA: Insufficient documentation

## 2014-03-31 DIAGNOSIS — I059 Rheumatic mitral valve disease, unspecified: Secondary | ICD-10-CM | POA: Diagnosis not present

## 2014-03-31 DIAGNOSIS — I509 Heart failure, unspecified: Secondary | ICD-10-CM | POA: Insufficient documentation

## 2014-03-31 DIAGNOSIS — Z8249 Family history of ischemic heart disease and other diseases of the circulatory system: Secondary | ICD-10-CM | POA: Insufficient documentation

## 2014-03-31 DIAGNOSIS — I080 Rheumatic disorders of both mitral and aortic valves: Secondary | ICD-10-CM | POA: Insufficient documentation

## 2014-03-31 DIAGNOSIS — R0989 Other specified symptoms and signs involving the circulatory and respiratory systems: Secondary | ICD-10-CM | POA: Insufficient documentation

## 2014-03-31 DIAGNOSIS — R079 Chest pain, unspecified: Secondary | ICD-10-CM | POA: Diagnosis not present

## 2014-03-31 DIAGNOSIS — R0609 Other forms of dyspnea: Secondary | ICD-10-CM | POA: Insufficient documentation

## 2014-03-31 DIAGNOSIS — Z87891 Personal history of nicotine dependence: Secondary | ICD-10-CM | POA: Insufficient documentation

## 2014-03-31 DIAGNOSIS — I079 Rheumatic tricuspid valve disease, unspecified: Secondary | ICD-10-CM | POA: Insufficient documentation

## 2014-03-31 NOTE — Progress Notes (Addendum)
Stress Lab Nurses Notes - Sabrina [name]  Sabrina [name] 03/31/2014 Reason for doing test: Chest pressure & SOB Type of test: Regular GTX Nurse performing test: Gerrit Halls, RN Nuclear Medicine Tech: Not Applicable Echo Tech: Not Applicable MD performing test: Ritchard Paragas/K.Lawrence NP Family MD: Moore/[facility] Test explained and consent signed: Yes.   IV started: No IV started Symptoms: SOB Treatment/Intervention: None Reason test stopped: SOB After recovery IV was: [name] Patient to return to Nuc. Med at : [name] Patient discharged: Home Patient's Condition upon discharge was: stable Comments: During test peak BP 169/118 & HR 125.  Recovery BP 137/111 & HR 91.  Symptoms resolved in recovery.  [name] T  ATTENDING ADDENDUM: ECG tracings did not demonstrate any evidence of ischemic ST-T abnormalities, nor any arrhythmias. There was significant undulating artifact throughout the study. Reduced exercise tolerance; submaximal stress test. Pt demonstrated diastolic hypertension. No chest pain was reported. Primary complaints were fatigue and dyspnea.

## 2014-03-31 NOTE — Progress Notes (Signed)
  Echocardiogram 2D Echocardiogram has been performed.  [place], [place] 03/31/2014, 10:10 AM

## 2014-04-03 ENCOUNTER — Encounter: Payer: Self-pay | Admitting: Family Medicine

## 2014-04-03 ENCOUNTER — Ambulatory Visit (INDEPENDENT_AMBULATORY_CARE_PROVIDER_SITE_OTHER): Payer: Medicare HMO | Admitting: Family Medicine

## 2014-04-03 VITALS — BP 127/84 | HR 78 | Temp 98.1°F | Ht 64.0 in | Wt 183.4 lb

## 2014-04-03 DIAGNOSIS — R0602 Shortness of breath: Secondary | ICD-10-CM

## 2014-04-03 DIAGNOSIS — E559 Vitamin D deficiency, unspecified: Secondary | ICD-10-CM

## 2014-04-03 DIAGNOSIS — E538 Deficiency of other specified B group vitamins: Secondary | ICD-10-CM

## 2014-04-03 NOTE — Progress Notes (Signed)
   Subjective:    Patient ID: Sabrina [name], female    DOB: 17-Jun-1946, 68 y.o.   MRN: [id number]  HPI This 68 y.o. female presents for evaluation of follow up on Sob.  She has exertional shortness of breath and has seen cardiology and she is still SOB and no changes.  She states she did have a stress test but had to stop because she was so SOB.  She has had echo and it was normal. She is due for follow up with cardiology this week to discuss her stress test.  She denies any Wheezing.  She states she cannot walk in the heat and gets out breath with the least of activity.   Review of Systems C/o SOB No chest pain, SOB, HA, dizziness, vision change, N/V, diarrhea, constipation, dysuria, urinary urgency or frequency, myalgias, arthralgias or rash.     Objective:   Physical Exam  Vital signs noted  Well developed well nourished female.  HEENT - Head atraumatic Normocephalic                Eyes - PERRLA, Conjuctiva - clear Sclera- Clear EOMI                Ears - EAC's Wnl TM's Wnl Gross Hearing WNL                Throat - oropharanx wnl Respiratory - Lungs CTA bilateral Cardiac - RRR S1 and S2 without murmur GI - Abdomen soft Nontender and bowel sounds active x 4 Extremities - No edema. Neuro - Grossly intact.      Assessment & Plan:  SOB (shortness of breath) - Follow up with cardiology in one week  B12 Def - Continue b12 injections  Vitamin D def - continue vitamin D rx  Follow up in one months  Lysbeth Penner FNP

## 2014-04-04 ENCOUNTER — Ambulatory Visit: Payer: Medicare HMO

## 2014-04-06 ENCOUNTER — Telehealth: Payer: Self-pay | Admitting: Internal Medicine

## 2014-04-06 DIAGNOSIS — R0602 Shortness of breath: Secondary | ICD-10-CM

## 2014-04-06 NOTE — Telephone Encounter (Signed)
Results of stress test / tgs  °

## 2014-04-06 NOTE — Telephone Encounter (Signed)
Please advise 

## 2014-04-24 ENCOUNTER — Telehealth: Payer: Self-pay | Admitting: Family Medicine

## 2014-04-24 NOTE — Telephone Encounter (Signed)
Patient aware that she can keep her appt on the 13 and then she may also get her b12 injection on that day

## 2014-04-28 NOTE — Telephone Encounter (Signed)
Reviewed with Dr Lovena Le and he recommends to start Furosemide 20mg  daily, BMP in one week and follow up with him in 8 weeks

## 2014-05-01 ENCOUNTER — Encounter: Payer: Self-pay | Admitting: *Deleted

## 2014-05-01 MED ORDER — FUROSEMIDE 20 MG PO TABS: 20.0000 mg | ORAL_TABLET | Freq: Every day | ORAL | Status: DC

## 2014-05-01 NOTE — Telephone Encounter (Signed)
Spoke to patient concerning lab/test results/instructions from provider. Patient understood.    

## 2014-05-04 ENCOUNTER — Encounter: Payer: Self-pay | Admitting: Family Medicine

## 2014-05-04 ENCOUNTER — Ambulatory Visit (INDEPENDENT_AMBULATORY_CARE_PROVIDER_SITE_OTHER): Payer: Medicare HMO | Admitting: Family Medicine

## 2014-05-04 VITALS — BP 113/77 | HR 81 | Temp 97.7°F | Ht 64.0 in | Wt 181.0 lb

## 2014-05-04 DIAGNOSIS — R0602 Shortness of breath: Secondary | ICD-10-CM

## 2014-05-04 DIAGNOSIS — E538 Deficiency of other specified B group vitamins: Secondary | ICD-10-CM

## 2014-05-04 NOTE — Progress Notes (Signed)
   Subjective:    Patient ID: Sabrina [name], female    DOB: Dec 08, 1945, 68 y.o.   MRN: [id number]  HPI  C/o SOB.  She states she went to see the cardiologist and she had stress test and she reports that she was told it was ok.  She was placed on lasix 20 mg po qd and she was told to get a bmp. She states she is SOB and wants to know if she can get something for SOB.  Review of Systems C/o sob   No chest pain, HA, dizziness, vision change, N/V, diarrhea, constipation, dysuria, urinary urgency or frequency, myalgias, arthralgias or rash.  Objective:   Physical Exam  Vital signs noted  Well developed well nourished female.  HEENT - Head atraumatic Normocephalic                Eyes - PERRLA, Conjuctiva - clear Sclera- Clear EOMI                Ears - EAC's Wnl TM's Wnl Gross Hearing WNL                 Throat - oropharanx wnl Respiratory - Lungs CTA bilateral Cardiac - RRR S1 and S2 without murmur GI - Abdomen soft Nontender and bowel sounds active x 4       Assessment & Plan:  Shortness of breath - Plan: BMP8+EGFR Symbicort 160/4.5 2 puffs bid #1 sample Follow up in 2 weeks  Lysbeth Penner FNP

## 2014-05-05 LAB — BMP8+EGFR
BUN/Creatinine Ratio: 22 (ref 11–26)
BUN: 18 mg/dL (ref 8–27)
CO2: 25 mmol/L (ref 18–29)
Calcium: 9.4 mg/dL (ref 8.7–10.3)
Chloride: 104 mmol/L (ref 97–108)
Creatinine, Ser: 0.81 mg/dL (ref 0.57–1.00)
GFR calc Af Amer: 88 mL/min/{1.73_m2} (ref >59)
GFR calc non Af Amer: 76 mL/min/{1.73_m2} (ref >59)
Glucose: 57 mg/dL — ABNORMAL LOW (ref 65–99)
Potassium: 4.4 mmol/L (ref 3.5–5.2)
Sodium: 143 mmol/L (ref 134–144)

## 2014-05-09 ENCOUNTER — Telehealth: Payer: Self-pay | Admitting: *Deleted

## 2014-05-09 NOTE — Telephone Encounter (Signed)
Pt notified of results Verbalizes understanding 

## 2014-05-09 NOTE — Telephone Encounter (Signed)
Message copied by Marin Olp on Tue May 09, 2014 12:00 PM ------      Message from: [place], [place] A      Created: Mon May 08, 2014  1:00 PM       Kidney function stable      Electrolytes WNL ------

## 2014-05-19 ENCOUNTER — Ambulatory Visit (INDEPENDENT_AMBULATORY_CARE_PROVIDER_SITE_OTHER): Payer: Medicare HMO | Admitting: Family Medicine

## 2014-05-19 ENCOUNTER — Ambulatory Visit (INDEPENDENT_AMBULATORY_CARE_PROVIDER_SITE_OTHER): Payer: Medicare HMO

## 2014-05-19 ENCOUNTER — Encounter: Payer: Self-pay | Admitting: Family Medicine

## 2014-05-19 VITALS — BP 115/86 | HR 85 | Temp 98.8°F | Ht 64.0 in | Wt 182.0 lb

## 2014-05-19 DIAGNOSIS — Z23 Encounter for immunization: Secondary | ICD-10-CM

## 2014-05-19 DIAGNOSIS — J441 Chronic obstructive pulmonary disease with (acute) exacerbation: Secondary | ICD-10-CM

## 2014-05-19 DIAGNOSIS — R0602 Shortness of breath: Secondary | ICD-10-CM

## 2014-05-19 MED ORDER — ALBUTEROL SULFATE (2.5 MG/3ML) 0.083% IN NEBU: 2.5000 mg | INHALATION_SOLUTION | Freq: Four times a day (QID) | RESPIRATORY_TRACT | Status: DC | PRN

## 2014-05-19 MED ORDER — DOXYCYCLINE HYCLATE 100 MG PO TABS: 100.0000 mg | ORAL_TABLET | Freq: Two times a day (BID) | ORAL | Status: DC

## 2014-05-19 MED ORDER — PREDNISONE 10 MG PO TABS: ORAL_TABLET | ORAL | Status: DC

## 2014-05-19 MED ORDER — IPRATROPIUM BROMIDE 0.02 % IN SOLN: 0.5000 mg | Freq: Four times a day (QID) | RESPIRATORY_TRACT | Status: DC | PRN

## 2014-05-19 MED ORDER — LEVALBUTEROL HCL 1.25 MG/0.5ML IN NEBU
1.2500 mg | INHALATION_SOLUTION | Freq: Once | RESPIRATORY_TRACT | Status: DC
Start: 2014-05-19 — End: 2014-11-20

## 2014-05-19 MED ORDER — BUDESONIDE-FORMOTEROL FUMARATE 160-4.5 MCG/ACT IN AERO: 2.0000 | INHALATION_SPRAY | Freq: Two times a day (BID) | RESPIRATORY_TRACT | Status: DC

## 2014-05-19 NOTE — Progress Notes (Signed)
   Subjective:    Patient ID: Sabrina [name], female    DOB: 23-Jul-1946, 68 y.o.   MRN: [id number]  HPI Patient is here for follow up.  She has hx of SOB.  She was rx'd symbicort 2 weeks ago and she states she feels a lot better and is not having SOB.  She has no hx of copd or asthma in the past.   Review of Systems    No chest pain, SOB, HA, dizziness, vision change, N/V, diarrhea, constipation, dysuria, urinary urgency or frequency, myalgias, arthralgias or rash.  Objective:   Physical Exam  Vital signs noted  Well developed well nourished female.  HEENT - Head atraumatic Normocephalic                Eyes - PERRLA, Conjuctiva - clear Sclera- Clear EOMI                Ears - EAC's Wnl TM's Wnl Gross Hearing WNL                Throat - oropharanx wnl Respiratory - Lungs CTA bilateral Cardiac - RRR S1 and S2 without murmur GI - Abdomen soft Nontender and bowel sounds active x 4 Extremities - No edema. Neuro - Grossly intact.      Assessment & Plan:  SOB (shortness of breath) - Plan: budesonide-formoterol (SYMBICORT) 160-4.5 MCG/ACT inhaler, PR BREATHING CAPACITY TEST  Lysbeth Penner FNP

## 2014-05-19 NOTE — Addendum Note (Signed)
Addended by: Lysbeth Penner on: 05/19/2014 04:19 PM   Modules accepted: Orders, Level of Service

## 2014-05-19 NOTE — Progress Notes (Addendum)
   Subjective:    Patient ID: Sabrina [name], female    DOB: Aug 30, 1946, 68 y.o.   MRN: [id number]  HPI    Review of Systems     Objective:   Physical Exam        Assessment & Plan:  PFT - FVC 113%           FEV1 - <39%           FEF 25-75 26%           PEF 26%            Oxygen saturation on Room Air - 98% After walking - 95%  2D echo - 55% EF with normal LV function  Cardiology referral - No cardiac etiology for SOB  CXR today - No changes since 03/01/14 - normal cxr - no infiltrates Prelimnary reading by Gwyndolyn Saxon Dorthie Santini,FNP  SOB (shortness of breath) - Plan: budesonide-formoterol (SYMBICORT) 160-4.5 MCG/ACT inhaler, PR BREATHING CAPACITY TEST, Ambulatory referral to Pulmonology, DG Chest 2 View  COPD exacerbation - Plan: doxycycline (VIBRA-TABS) 100 MG tablet, predniSONE (DELTASONE) 10 MG tablet, levalbuterol (XOPENEX) nebulizer solution 1.25 mg Tx for copd exacerbation.  She does feel a lot better after neb treatment and states she can breathe.  Nebulizer, albuterol, and atrovent for neb tx's  Follow up in 2 weeks  Lysbeth Penner FNP

## 2014-05-19 NOTE — Patient Instructions (Signed)
Varicella-Zoster Virus Vaccine Live injection What is this medicine? VARICELLA VIRUS VACCINE (var uh SEL uh VAHY ruhs vak SEEN) is used to prevent infections of chickenpox. HERPES ZOSTER VIRUS VACCINE (HUR peez ZOS ter vahy ruhs vak SEEN) is used to prevent shingles in adults 67 years old and over. This vaccine is not used to treat shingles or nerve pain from shingles. These medicines may be used for other purposes; ask your health care provider or pharmacist if you have questions. This medicine may be used for other purposes; ask your health care provider or pharmacist if you have questions. COMMON BRAND NAME(S): Varivax, Zostavax What should I tell my health care provider before I take this medicine? They need to know if you have any of the following conditions: -blood disorders or disease -cancer like leukemia or lymphoma -immune system problems or therapy -infection with fever -recent immune globulin therapy -tuberculosis -an unusual or allergic reaction to vaccines, neomycin, gelatin, other medicines, foods, dyes, or preservatives -pregnant or trying to get pregnant -breast-feeding How should I use this medicine? These vaccines are for injection under the skin. They are given by a health care professional. A copy of Vaccine Information Statements will be given before each varicella virus vaccination. Read this sheet carefully each time. The sheet may change frequently. A Vaccine Information Statement is not given before the herpes zoster virus vaccine. Talk to your pediatrician regarding the use of the varicella virus vaccine in children. While this drug may be prescribed for children as young as 12 months of age for selected conditions, precautions do apply. The herpes zoster virus vaccine is not approved in children. Overdosage: If you think you have taken too much of this medicine contact a poison control center or emergency room at once. NOTE: This medicine is only for you. Do not  share this medicine with others. What if I miss a dose? Keep appointments for follow-up (booster) doses of varicella virus vaccine as directed. It is important not to miss your dose. Call your doctor or health care professional if you are unable to keep an appointment. Follow-up (booster) doses are not needed for the herpes zoster virus vaccine. What may interact with this medicine? Do not take these medicines with any of the following medications: -adalimumab -anakinra -etanercept -infliximab -medicines that suppress your immune system -medicines to treat cancer These medicines may also interact with the following medications: -aspirin and aspirin-like medicines (varicella virus vaccine only) -blood transfusions (varicella virus vaccine only) -immunoglobulins (varicella virus vaccine only) -steroid medicines like prednisone or cortisone This list may not describe all possible interactions. Give your health care provider a list of all the medicines, herbs, non-prescription drugs, or dietary supplements you use. Also tell them if you smoke, drink alcohol, or use illegal drugs. Some items may interact with your medicine. What should I watch for while using this medicine? Visit your doctor for regular check ups. These vaccines, like all vaccines, may not fully protect everyone. After receiving these vaccines it may be possible to pass chickenpox infection to others. For up to 6 weeks, avoid people with immune system problems, pregnant women who have not had chickenpox, newborns of women who have not had chickenpox, and all newborns born at less than 28 weeks of pregnancy. Talk to your doctor for more information. Do not become pregnant for 3 months after taking these vaccines. Women should inform their doctor if they wish to become pregnant or think they might be pregnant. There is a potential for serious   side effects to an unborn child. Talk to your health care professional or pharmacist for more  information. What side effects may I notice from receiving this medicine? Side effects that you should report to your doctor or health care professional as soon as possible: -allergic reactions like skin rash, itching or hives, swelling of the face, lips, or tongue -breathing problems -extreme changes in behavior -feeling faint or lightheaded, falls -fever over 102 degrees F -pain, tingling, numbness in the hands or feet -redness, blistering, peeling or loosening of the skin, including inside the mouth -seizures -unusually weak or tired Side effects that usually do not require medical attention (report to your doctor or health care professional if they continue or are bothersome): -aches or pains -chickenpox-like rash -diarrhea -headache -low-grade fever under 102 degrees F -loss of appetite -nausea, vomiting -redness, pain, swelling at site where injected -sleepy -trouble sleeping This list may not describe all possible side effects. Call your doctor for medical advice about side effects. You may report side effects to FDA at 1-800-FDA-1088. Where should I keep my medicine? These drugs are given in a hospital or clinic and will not be stored at home. NOTE: This sheet is a summary. It may not cover all possible information. If you have questions about this medicine, talk to your doctor, pharmacist, or health care provider.  2015, [employer]. (2013-05-13 14:24:35)  

## 2014-05-20 ENCOUNTER — Telehealth: Payer: Self-pay | Admitting: Family Medicine

## 2014-05-23 ENCOUNTER — Telehealth: Payer: Self-pay | Admitting: Family Medicine

## 2014-05-26 ENCOUNTER — Ambulatory Visit (INDEPENDENT_AMBULATORY_CARE_PROVIDER_SITE_OTHER): Payer: Medicare HMO | Admitting: Family Medicine

## 2014-05-26 ENCOUNTER — Encounter: Payer: Self-pay | Admitting: Family Medicine

## 2014-05-26 VITALS — BP 120/87 | HR 108 | Temp 97.8°F | Ht 64.0 in | Wt 182.2 lb

## 2014-05-26 DIAGNOSIS — E538 Deficiency of other specified B group vitamins: Secondary | ICD-10-CM

## 2014-05-26 DIAGNOSIS — J441 Chronic obstructive pulmonary disease with (acute) exacerbation: Secondary | ICD-10-CM

## 2014-05-26 MED ORDER — CYANOCOBALAMIN 1000 MCG/ML IJ SOLN
1000.0000 ug | INTRAMUSCULAR | Status: DC
  Administered 2014-05-26 – 2014-07-10 (×2): 1000 ug via INTRAMUSCULAR

## 2014-05-26 NOTE — Telephone Encounter (Signed)
Faxed original Rx to [facility]

## 2014-05-26 NOTE — Progress Notes (Signed)
   Subjective:    Patient ID: Sabrina [name], female    DOB: [birth year], 68 y.o.   MRN: [id number]  HPI  Patient has hx of COPD and she is having exacerbation and she is feeling better on prednisone and Amoxicillin rx.  Review of Systems    No chest pain, SOB, HA, dizziness, vision change, N/V, diarrhea, constipation, dysuria, urinary urgency or frequency, myalgias, arthralgias or rash.  Objective:   Physical Exam  Vital signs noted  Well developed well nourished female.  HEENT - Head atraumatic Normocephalic                Eyes - PERRLA, Conjuctiva - clear Sclera- Clear EOMI                Ears - EAC's Wnl TM's Wnl Gross Hearing WNL                Nose - Nares patent                 Throat - oropharanx wnl Respiratory - Lungs CTA bilateral Cardiac - RRR S1 and S2 without murmur GI - Abdomen soft Nontender and bowel sounds active x 4 Extremities - No edema. Neuro - Grossly intact.      Assessment & Plan:  COPD exacerbation - Doing a lot better and continue with current course of tx  B12 deficiency - Plan: cyanocobalamin ((VITAMIN B-12)) injection 1,000 mcg  Lysbeth Penner FNP

## 2014-06-01 MED ORDER — LEVALBUTEROL HCL 1.25 MG/3ML IN NEBU
1.2500 mg | INHALATION_SOLUTION | Freq: Once | RESPIRATORY_TRACT | Status: AC
  Administered 2014-05-19: 1.25 mg via RESPIRATORY_TRACT

## 2014-06-01 NOTE — Addendum Note (Signed)
Addended by: Marin Olp on: 06/01/2014 12:12 PM   Modules accepted: Orders

## 2014-07-10 ENCOUNTER — Ambulatory Visit (INDEPENDENT_AMBULATORY_CARE_PROVIDER_SITE_OTHER): Payer: Medicare HMO | Admitting: *Deleted

## 2014-07-10 DIAGNOSIS — E538 Deficiency of other specified B group vitamins: Secondary | ICD-10-CM

## 2014-07-19 ENCOUNTER — Encounter: Payer: Self-pay | Admitting: Internal Medicine

## 2014-07-19 ENCOUNTER — Encounter: Payer: Self-pay | Admitting: *Deleted

## 2014-07-19 ENCOUNTER — Ambulatory Visit (INDEPENDENT_AMBULATORY_CARE_PROVIDER_SITE_OTHER): Payer: Commercial Managed Care - HMO | Admitting: Internal Medicine

## 2014-07-19 VITALS — BP 126/86 | HR 94 | Ht 64.0 in | Wt 185.0 lb

## 2014-07-19 DIAGNOSIS — R0602 Shortness of breath: Secondary | ICD-10-CM

## 2014-07-19 DIAGNOSIS — R079 Chest pain, unspecified: Secondary | ICD-10-CM

## 2014-07-19 DIAGNOSIS — R0789 Other chest pain: Secondary | ICD-10-CM

## 2014-07-19 NOTE — Patient Instructions (Signed)
Your physician recommends that you schedule a follow-up appointment in: 6 weeks  Your physician has requested that you have a lexiscan myoview. For further information please visit [email or web address]. Please follow instruction sheet, as given.  Your physician recommends that you continue on your current medications as directed. Please refer to the Current Medication list given to you today.  Thank you for choosing [facility]!!

## 2014-07-19 NOTE — Assessment & Plan Note (Signed)
Her stress test was non-diagnostic and she continue to have symptoms. I have recommended she undergo Lexiscan myoview. Additional recs will depend on the results of the [place].

## 2014-07-19 NOTE — Assessment & Plan Note (Signed)
I suspect this is multifactorial. We discussed possible causes including diastolic dysfunction, lung disease, deconditioning, and HTN. She has been instructed to reduce her salt intake, take an additional lasix as needed for sob and peripheral edema. Will consider adding a beta blocker.

## 2014-07-19 NOTE — Progress Notes (Signed)
HPI [name] returns today for ongoing evaluation of chest pain and sob. She was diagnosed with MVP approx. 20 yrs ago when she presented with CHF. She stopped smoking approx. 15 yrs ago. She has a family h/o CAD with mother having multiple stents and cardiac procedures. She has intermittent peripheral edema and sob with exertion and underwent a regular treadmill stress test which was non-diagnostic. She continues to have sob and chest pressure with exertion and also notes swelling in her hands and feet later in the day. Her 2D echo was normal except for very mild MR/TR. Allergies  Allergen Reactions  . Codeine Nausea And Vomiting     Current Outpatient Prescriptions  Medication Sig Dispense Refill  . albuterol (PROVENTIL) (2.5 MG/3ML) 0.083% nebulizer solution Take 3 mLs (2.5 mg total) by nebulization every 6 (six) hours as needed for wheezing or shortness of breath.  150 mL  1  . aspirin 81 MG tablet Take 81 mg by mouth daily.      . budesonide-formoterol (SYMBICORT) 160-4.5 MCG/ACT inhaler Inhale 2 puffs into the lungs 2 (two) times daily.  1 Inhaler  11  . doxycycline (VIBRA-TABS) 100 MG tablet Take 1 tablet (100 mg total) by mouth 2 (two) times daily.  20 tablet  0  . DULoxetine (CYMBALTA) 30 MG capsule Take 1 capsule (30 mg total) by mouth daily.  30 capsule  11  . ferrous sulfate 325 (65 FE) MG EC tablet Take 1 tablet (325 mg total) by mouth daily with breakfast.  30 tablet  11  . furosemide (LASIX) 20 MG tablet Take 1 tablet (20 mg total) by mouth daily.  30 tablet  3  . ipratropium (ATROVENT) 0.02 % nebulizer solution Take 2.5 mLs (0.5 mg total) by nebulization every 6 (six) hours as needed for wheezing or shortness of breath.  75 mL  12  . meloxicam (MOBIC) 15 MG tablet Take 1 tablet (15 mg total) by mouth daily.  30 tablet  11  . predniSONE (DELTASONE) 10 MG tablet Take 4 po qd x 2 days then 3 po qd x 2 days then 2 po qd x 2 days then one po qd x 2 days  20 tablet  0  .  traZODone (DESYREL) 50 MG tablet Take 1 tablet (50 mg total) by mouth at bedtime.  30 tablet  3   Current Facility-Administered Medications  Medication Dose Route Frequency Provider Last Rate Last Dose  . cyanocobalamin ((VITAMIN B-12)) injection 1,000 mcg  1,000 mcg Intramuscular Q30 days Lysbeth Penner, FNP   1,000 mcg at 07/10/14 1506  . levalbuterol (XOPENEX) nebulizer solution 1.25 mg  1.25 mg Nebulization Once Lysbeth Penner, FNP         Past Medical History  Diagnosis Date  . Depression     ROS:   All systems reviewed and negative except as noted in the HPI.   Past Surgical History  Procedure Laterality Date  . Abdominal hysterectomy    . Appendectomy    . Tonsillectomy    . Cholecystectomy    . Gastric bypass       Family History  Problem Relation Age of Onset  . Cancer Mother   . Alzheimer's disease Mother   . Arthritis Father      History   Social History  . Marital Status: Divorced    Spouse Name: N/A    Number of Children: N/A  . Years of Education: N/A   Occupational History  .  Not on file.   Social History Main Topics  . Smoking status: Former Smoker    Quit date: 02/26/1993  . Smokeless tobacco: Not on file  . Alcohol Use: Not on file  . Drug Use: Not on file  . Sexual Activity: Not on file   Other Topics Concern  . Not on file   Social History Narrative  . No narrative on file     There were no vitals taken for this visit.  Physical Exam:  Well appearing middle aged woman, NAD HEENT: Unremarkable Neck:  6 cm JVD, no thyromegally Back:  No CVA tenderness Lungs:  Clear with no wheezes HEART:  Regular rate rhythm, no murmurs, no rubs, no clicks Abd:  soft, positive bowel sounds, no organomegally, no rebound, no guarding Ext:  2 plus pulses, no edema, no cyanosis, no clubbing Skin:  No rashes no nodules Neuro:  CN II through XII intact, motor grossly intact    Assess/Plan:

## 2014-07-21 ENCOUNTER — Other Ambulatory Visit: Payer: Self-pay | Admitting: Family Medicine

## 2014-07-24 ENCOUNTER — Encounter (HOSPITAL_COMMUNITY)
Admission: RE | Admit: 2014-07-24 | Discharge: 2014-07-24 | Disposition: A | Discharge status: Home / Self Care | Payer: Medicare HMO | Source: Ambulatory Visit | Attending: Internal Medicine | Admitting: Internal Medicine

## 2014-07-24 ENCOUNTER — Encounter (HOSPITAL_COMMUNITY): Payer: Self-pay

## 2014-07-24 ENCOUNTER — Ambulatory Visit (HOSPITAL_COMMUNITY)
Admission: RE | Admit: 2014-07-24 | Discharge: 2014-07-24 | Disposition: A | Discharge status: Home / Self Care | Payer: Medicare HMO | Source: Ambulatory Visit | Attending: Internal Medicine | Admitting: Internal Medicine

## 2014-07-24 DIAGNOSIS — R079 Chest pain, unspecified: Secondary | ICD-10-CM | POA: Insufficient documentation

## 2014-07-24 HISTORY — DX: Systemic involvement of connective tissue, unspecified: M35.9

## 2014-07-24 MED ORDER — TECHNETIUM TC 99M SESTAMIBI - CARDIOLITE
10.0000 | Freq: Once | INTRAVENOUS | Status: AC | PRN
  Administered 2014-07-24: 10 via INTRAVENOUS

## 2014-07-24 MED ORDER — SODIUM CHLORIDE 0.9 % IJ SOLN
10.0000 mL | INTRAMUSCULAR | Status: DC | PRN
  Administered 2014-07-24: 10 mL via INTRAVENOUS
  Filled 2014-07-24: qty 10

## 2014-07-24 MED ORDER — TECHNETIUM TC 99M SESTAMIBI GENERIC - CARDIOLITE
30.0000 | Freq: Once | INTRAVENOUS | Status: AC | PRN
  Administered 2014-07-24: 30 via INTRAVENOUS

## 2014-07-24 MED ORDER — REGADENOSON 0.4 MG/5ML IV SOLN
INTRAVENOUS | Status: AC
  Administered 2014-07-24: 0.4 mg via INTRAVENOUS
  Filled 2014-07-24: qty 5

## 2014-07-24 MED ORDER — SODIUM CHLORIDE 0.9 % IJ SOLN
INTRAMUSCULAR | Status: AC
  Filled 2014-07-24: qty 18

## 2014-07-24 MED ORDER — REGADENOSON 0.4 MG/5ML IV SOLN
0.4000 mg | Freq: Once | INTRAVENOUS | Status: AC | PRN
  Administered 2014-07-24: 0.4 mg via INTRAVENOUS

## 2014-07-24 MED ORDER — SODIUM CHLORIDE 0.9 % IJ SOLN
INTRAMUSCULAR | Status: AC
  Administered 2014-07-24: 10 mL via INTRAVENOUS
  Filled 2014-07-24: qty 10

## 2014-07-24 NOTE — Progress Notes (Signed)
Stress Lab Nurses Notes - [name]  Sabrina [name] 07/24/2014 Reason for doing test: Chest Pain and Dyspnea Type of test: Wille Glaser Nurse performing test: Gerrit Halls, RN Nuclear Medicine Tech: Melburn Hake Echo Tech: Not Applicable MD performing test: Branch/K.Purcell Nails NP Family MD: Laurance Flatten Test explained and consent signed: Yes.   IV started: Saline lock flushed, No redness or edema and Saline lock started in radiology Symptoms: Chest pressure & SOB  Treatment/Intervention: None Reason test stopped: protocol completed After recovery IV was: Discontinued via X-ray tech and No redness or edema Patient to return to Nuc. Med at : 10:45 Patient discharged: Home Patient's Condition upon discharge was: stable Comments: During test BP 125/83 & HR 102.  Recovery BP 121/85 & HR 96.  Symptoms resolved in recovery.  [name] T

## 2014-07-24 NOTE — Telephone Encounter (Signed)
Last OV 05-24-14. Please advise

## 2014-09-24 ENCOUNTER — Other Ambulatory Visit: Payer: Self-pay | Admitting: Internal Medicine

## 2014-09-25 ENCOUNTER — Ambulatory Visit (INDEPENDENT_AMBULATORY_CARE_PROVIDER_SITE_OTHER): Payer: Commercial Managed Care - HMO | Admitting: Internal Medicine

## 2014-09-25 ENCOUNTER — Encounter: Payer: Self-pay | Admitting: Internal Medicine

## 2014-09-25 VITALS — BP 142/90 | HR 79 | Ht 64.0 in | Wt 187.0 lb

## 2014-09-25 DIAGNOSIS — I10 Essential (primary) hypertension: Secondary | ICD-10-CM | POA: Diagnosis not present

## 2014-09-25 DIAGNOSIS — R0602 Shortness of breath: Secondary | ICD-10-CM | POA: Diagnosis not present

## 2014-09-25 DIAGNOSIS — R0789 Other chest pain: Secondary | ICD-10-CM

## 2014-09-25 DIAGNOSIS — E663 Overweight: Secondary | ICD-10-CM | POA: Insufficient documentation

## 2014-09-25 DIAGNOSIS — I5032 Chronic diastolic (congestive) heart failure: Secondary | ICD-10-CM | POA: Diagnosis not present

## 2014-09-25 DIAGNOSIS — E669 Obesity, unspecified: Secondary | ICD-10-CM | POA: Insufficient documentation

## 2014-09-25 MED ORDER — FUROSEMIDE 20 MG PO TABS: 20.0000 mg | ORAL_TABLET | Freq: Two times a day (BID) | ORAL | Status: DC

## 2014-09-25 NOTE — Progress Notes (Signed)
HPI Mrs. Randolm Idol returns today for followup. She is a pleasant 69 yo woman who I saw for dyspnea several months ago associated with c/p. She had a stress test which was non-diagnostic and then a nuclear stress test which was negative for ischemia and demonstrated normal LV function. She continues to complain of both dyspnea and a cough with green sputum. No fever or chills. No edema. She thinks that the low dose of lasix we prescribed her also helped her symptoms.  Allergies  Allergen Reactions  . Codeine Nausea And Vomiting     Current Outpatient Prescriptions  Medication Sig Dispense Refill  . albuterol (PROVENTIL) (2.5 MG/3ML) 0.083% nebulizer solution Take 3 mLs (2.5 mg total) by nebulization every 6 (six) hours as needed for wheezing or shortness of breath. 150 mL 1  . aspirin 81 MG tablet Take 81 mg by mouth daily.    . budesonide-formoterol (SYMBICORT) 160-4.5 MCG/ACT inhaler Inhale 2 puffs into the lungs 2 (two) times daily. 1 Inhaler 11  . DULoxetine (CYMBALTA) 30 MG capsule Take 1 capsule (30 mg total) by mouth daily. 30 capsule 11  . ferrous sulfate 325 (65 FE) MG EC tablet Take 1 tablet (325 mg total) by mouth daily with breakfast. 30 tablet 11  . furosemide (LASIX) 20 MG tablet TAKE 1 TABLET (20 MG TOTAL) BY MOUTH DAILY. 30 tablet 6  . ipratropium (ATROVENT) 0.02 % nebulizer solution Take 2.5 mLs (0.5 mg total) by nebulization every 6 (six) hours as needed for wheezing or shortness of breath. 75 mL 12  . meloxicam (MOBIC) 15 MG tablet Take 1 tablet (15 mg total) by mouth daily. 30 tablet 11  . traZODone (DESYREL) 50 MG tablet Take 1 tablet (50 mg total) by mouth at bedtime. 30 tablet 3   Current Facility-Administered Medications  Medication Dose Route Frequency Provider Last Rate Last Dose  . cyanocobalamin ((VITAMIN B-12)) injection 1,000 mcg  1,000 mcg Intramuscular Q30 days Lysbeth Penner, FNP   1,000 mcg at 07/10/14 1506  . levalbuterol (XOPENEX) nebulizer solution  1.25 mg  1.25 mg Nebulization Once Lysbeth Penner, FNP   1.25 mg at 05/19/14 1600     Past Medical History  Diagnosis Date  . Depression   . Collagen vascular disease   . Sickle cell anemia     ROS:   All systems reviewed and negative except as noted in the HPI.   Past Surgical History  Procedure Laterality Date  . Abdominal hysterectomy    . Appendectomy    . Tonsillectomy    . Cholecystectomy    . Gastric bypass       Family History  Problem Relation Age of Onset  . Cancer Mother   . Alzheimer's disease Mother   . Arthritis Father      History   Social History  . Marital Status: Divorced    Spouse Name: N/A    Number of Children: N/A  . Years of Education: N/A   Occupational History  . Not on file.   Social History Main Topics  . Smoking status: Former Smoker    Quit date: 02/26/1993  . Smokeless tobacco: Not on file  . Alcohol Use: Not on file  . Drug Use: Not on file  . Sexual Activity: Not on file   Other Topics Concern  . Not on file   Social History Narrative     BP 142/90 mmHg  Pulse 79  Ht 5\' 4"  (1.626 m)  Wt 187 lb (84.823 kg)  BMI 32.08 kg/m2  SpO2 99%  Physical Exam:  Well appearing 69 yo woman, NAD HEENT: Unremarkable Neck:  No JVD, no thyromegally Back:  No CVA tenderness Lungs:  Clear with no wheezes HEART:  Regular rate rhythm, no murmurs, no rubs, no clicks Abd:  soft, positive bowel sounds, no organomegally, no rebound, no guarding Ext:  2 plus pulses, no edema, no cyanosis, no clubbing Skin:  No rashes no nodules Neuro:  CN II through XII intact, motor grossly intact  Assess/Plan:

## 2014-09-25 NOTE — Assessment & Plan Note (Signed)
I suspect that this is multifactorial. I have asked the patient to increase her lasix to 20 mg bid and will plan to check BMP in a week. She is encouraged to lose weight and maintain a low sodium diet.

## 2014-09-25 NOTE — Assessment & Plan Note (Signed)
Her stress test was negative and her symptoms have resolved. Watchful waiting.

## 2014-09-25 NOTE — Patient Instructions (Signed)
You have been referred to Dr. Lamonte Sakai   Your physician recommends that you return for lab work in: 1 week Ceres has recommended you make the following change in your medication:   [place]  Thank you for choosing [facility]!!

## 2014-09-25 NOTE — Assessment & Plan Note (Signed)
She is encouraged to lose weight.  

## 2014-09-25 NOTE — Assessment & Plan Note (Signed)
Her blood pressure is elevated today. She is encouraged to lose weight, maintain a low fat, low sodium diet, and will consider adding additional medicaltions if her pressure does not improve.

## 2014-09-25 NOTE — Assessment & Plan Note (Signed)
I strongly suspect that this is part of her dyspnea and will adjust meds as needed. She is encouraged to lose weight and reduce her sodium intake.

## 2014-10-03 DIAGNOSIS — R0602 Shortness of breath: Secondary | ICD-10-CM | POA: Diagnosis not present

## 2014-10-04 LAB — BASIC METABOLIC PANEL WITH GFR
BUN: 13 mg/dL (ref 6–23)
CALCIUM: 9.1 mg/dL (ref 8.4–10.5)
CO2: 30 mEq/L (ref 19–32)
Chloride: 106 mEq/L (ref 96–112)
Creat: 0.76 mg/dL (ref 0.50–1.10)
GFR, Est African American: >89 mL/min
GFR, Est Non African American: 81 mL/min
Glucose, Bld: 86 mg/dL (ref 70–99)
Potassium: 4.5 mEq/L (ref 3.5–5.3)
SODIUM: 142 meq/L (ref 135–145)

## 2014-11-20 ENCOUNTER — Encounter: Payer: Self-pay | Admitting: Emergency Medicine

## 2014-11-20 ENCOUNTER — Ambulatory Visit (INDEPENDENT_AMBULATORY_CARE_PROVIDER_SITE_OTHER): Payer: Commercial Managed Care - HMO | Admitting: Emergency Medicine

## 2014-11-20 VITALS — BP 112/82 | HR 97 | Ht 64.0 in | Wt 184.0 lb

## 2014-11-20 DIAGNOSIS — R0602 Shortness of breath: Secondary | ICD-10-CM | POA: Diagnosis not present

## 2014-11-20 MED ORDER — LORATADINE 10 MG PO TABS: 10.0000 mg | ORAL_TABLET | Freq: Every day | ORAL | Status: DC

## 2014-11-20 MED ORDER — FLUTICASONE PROPIONATE 50 MCG/ACT NA SUSP: 2.0000 | Freq: Every day | NASAL | Status: DC

## 2014-11-20 NOTE — Progress Notes (Signed)
Subjective:    Patient ID: Sabrina [name], female    DOB: [birth year], 69 y.o.   MRN: [id number]  HPI 69 yo woman, hx of tobacco use (30 pk-yrs), depression, MVP c/b CHF. May have had asthma a girl. She has been evaluated for dyspnea and edema, with a reassuring echocardiogram and stress myoview end of 2015. She is referred for further evaluation. She has been working on her exercise routine since her eval by Dr Lovena Le. She still has to stop to rest during the exercise - she feels a heaviness in her chest, a "closing up" in her chest, sometimes also in her UA, nasal passages / head.  She is on symbicort, has been on it for 3 yrs > it does help her. She has some periods of confusion often w exertion, associated with tremor and sweating.    Review of Systems  Constitutional: Negative for fever and unexpected weight change.  HENT: Positive for congestion, ear pain and sore throat. Negative for dental problem, nosebleeds, postnasal drip, rhinorrhea, sinus pressure, sneezing and trouble swallowing.   Eyes: Negative for redness and itching.  Respiratory: Positive for cough and shortness of breath. Negative for chest tightness and wheezing.   Cardiovascular: Positive for palpitations. Negative for leg swelling.  Gastrointestinal: Positive for abdominal pain. Negative for nausea and vomiting.  Genitourinary: Negative for dysuria.  Musculoskeletal: Negative for joint swelling.  Skin: Negative for rash.  Neurological: Positive for headaches.  Hematological: Does not bruise/bleed easily.  Psychiatric/Behavioral: Positive for dysphoric mood. The patient is nervous/anxious.     Past Medical History  Diagnosis Date  . Depression   . Collagen vascular disease   . Sickle cell anemia      Family History  Problem Relation Age of Onset  . Cancer Mother   . Alzheimer's disease Mother   . Arthritis Father      History   Social History  . Marital Status: Divorced    Spouse Name: N/A  . Number  of Children: N/A  . Years of Education: N/A   Occupational History  . Not on file.   Social History Main Topics  . Smoking status: Former Smoker    Quit date: 02/26/1993  . Smokeless tobacco: Not on file  . Alcohol Use: Not on file  . Drug Use: Not on file  . Sexual Activity: Not on file   Other Topics Concern  . Not on file   Social History Narrative     Allergies  Allergen Reactions  . Codeine Nausea And Vomiting     Outpatient Prescriptions Prior to Visit  Medication Sig Dispense Refill  . albuterol (PROVENTIL) (2.5 MG/3ML) 0.083% nebulizer solution Take 3 mLs (2.5 mg total) by nebulization every 6 (six) hours as needed for wheezing or shortness of breath. 150 mL 1  . aspirin 81 MG tablet Take 81 mg by mouth daily.    . budesonide-formoterol (SYMBICORT) 160-4.5 MCG/ACT inhaler Inhale 2 puffs into the lungs 2 (two) times daily. 1 Inhaler 11  . DULoxetine (CYMBALTA) 30 MG capsule Take 1 capsule (30 mg total) by mouth daily. 30 capsule 11  . ferrous sulfate 325 (65 FE) MG EC tablet Take 1 tablet (325 mg total) by mouth daily with breakfast. 30 tablet 11  . furosemide (LASIX) 20 MG tablet Take 1 tablet (20 mg total) by mouth 2 (two) times daily. 90 tablet 3  . ipratropium (ATROVENT) 0.02 % nebulizer solution Take 2.5 mLs (0.5 mg total) by nebulization every 6 (six)  hours as needed for wheezing or shortness of breath. 75 mL 12  . meloxicam (MOBIC) 15 MG tablet Take 1 tablet (15 mg total) by mouth daily. 30 tablet 11  . traZODone (DESYREL) 50 MG tablet Take 1 tablet (50 mg total) by mouth at bedtime. 30 tablet 3  . cyanocobalamin ((VITAMIN B-12)) injection 1,000 mcg     . levalbuterol (XOPENEX) nebulizer solution 1.25 mg      No facility-administered medications prior to visit.         Objective:   Physical Exam Filed Vitals:   11/20/14 1448  BP: 112/82  Pulse: 97  Height: 5\' 4"  (1.626 m)  Weight: 184 lb (83.462 kg)  SpO2: 95%   Gen: Pleasant, overwt, in no  distress,  normal affect  ENT: No lesions,  mouth clear,  oropharynx clear, no postnasal drip  Neck: No JVD, no TMG, no carotid bruits  Lungs: No use of accessory muscles, clear without rales or rhonchi  Cardiovascular: RRR, heart sounds normal, no murmur or gallops, no peripheral edema  Musculoskeletal: No deformities, no cyanosis or clubbing  Neuro: alert, non focal  Skin: Warm, no lesions or rashes   07/24/14 --  Myoview stress Test  MPRESSION: 1. No reversible ischemia or infarction.  2. Normal left ventricular wall motion.  3. Left ventricular ejection fraction 67%  4. Low-risk stress test findings*.   05/19/14 -- CXR  COMPARISON: 03/01/2014  FINDINGS: The heart size and mediastinal contours are within normal limits. Both lungs are clear. The visualized skeletal structures are unremarkable. Postop changes of the left upper quadrant. Minor thoracic atherosclerosis. Minor left base atelectasis versus scarring. Trachea is midline.  IMPRESSION: Stable chest exam. No superimposed acute process     Assessment & Plan:  SOB (shortness of breath) I suspect that Sabrina [name]'s dyspnea is largely related to obstructive lung disease. Her cardiac evaluation has been reassuring so far. She is doing more exercise as recommended, but she does still need to stop to rest frequently. She has been on Symbicort for about 3 years and does note that it helps her breathing. One her complaints is difficulty moving air through her nasal passages and sinuses. I like to address both allergic rhinitis and presumed obstructive lung disease.   We will perform walking oximetry today  We will schedule full pulmonary function testing at you next office visit Please start Spiriva respimat 2 inhalations once a day  Continue your Symbicort 2 puffs twice a day. Rinse your mouth after using Start fluticasone nasal spray, 2 sprays each side daily Start loratadine 10mg  daily Follow with Dr  Lamonte Sakai in 1 months with full PFT's

## 2014-11-20 NOTE — Assessment & Plan Note (Signed)
I suspect that [name]'s dyspnea is largely related to obstructive lung disease. Her cardiac evaluation has been reassuring so far. She is doing more exercise as recommended, but she does still need to stop to rest frequently. She has been on Symbicort for about 3 years and does note that it helps her breathing. One her complaints is difficulty moving air through her nasal passages and sinuses. I like to address both allergic rhinitis and presumed obstructive lung disease.   We will perform walking oximetry today  We will schedule full pulmonary function testing at you next office visit Please start Spiriva respimat 2 inhalations once a day  Continue your Symbicort 2 puffs twice a day. Rinse your mouth after using Start fluticasone nasal spray, 2 sprays each side daily Start loratadine 10mg  daily Follow with Dr Lamonte Sakai in 1 months with full PFT's

## 2014-11-20 NOTE — Patient Instructions (Signed)
We will perform walking oximetry today  We will schedule full pulmonary function testing at you next office visit Please start Spiriva respimat 2 inhalations once a day  Continue your Symbicort 2 puffs twice a day. Rinse your mouth after using Start fluticasone nasal spray, 2 sprays each side daily Start loratadine 10mg  daily Follow with Dr Lamonte Sakai in 1 months with full PFT's

## 2014-11-25 ENCOUNTER — Other Ambulatory Visit: Payer: Self-pay | Admitting: Family Medicine

## 2014-11-27 ENCOUNTER — Encounter: Payer: Self-pay | Admitting: Family

## 2014-11-27 ENCOUNTER — Ambulatory Visit (INDEPENDENT_AMBULATORY_CARE_PROVIDER_SITE_OTHER): Payer: Commercial Managed Care - HMO | Admitting: Family

## 2014-11-27 VITALS — BP 110/79 | HR 78 | Temp 96.8°F | Ht 64.0 in | Wt 185.0 lb

## 2014-11-27 DIAGNOSIS — R309 Painful micturition, unspecified: Secondary | ICD-10-CM

## 2014-11-27 DIAGNOSIS — R259 Unspecified abnormal involuntary movements: Secondary | ICD-10-CM | POA: Diagnosis not present

## 2014-11-27 DIAGNOSIS — R5383 Other fatigue: Secondary | ICD-10-CM

## 2014-11-27 DIAGNOSIS — R631 Polydipsia: Secondary | ICD-10-CM

## 2014-11-27 DIAGNOSIS — L309 Dermatitis, unspecified: Secondary | ICD-10-CM | POA: Diagnosis not present

## 2014-11-27 DIAGNOSIS — R300 Dysuria: Secondary | ICD-10-CM | POA: Diagnosis not present

## 2014-11-27 DIAGNOSIS — N390 Urinary tract infection, site not specified: Secondary | ICD-10-CM

## 2014-11-27 DIAGNOSIS — R319 Hematuria, unspecified: Secondary | ICD-10-CM

## 2014-11-27 DIAGNOSIS — R251 Tremor, unspecified: Secondary | ICD-10-CM

## 2014-11-27 LAB — POCT URINALYSIS DIPSTICK
Bilirubin, UA: NEGATIVE
Glucose, UA: NEGATIVE
Ketones, UA: NEGATIVE
PROTEIN UA: NEGATIVE
Spec Grav, UA: 1.01
UROBILINOGEN UA: NEGATIVE
pH, UA: 5

## 2014-11-27 LAB — POCT UA - MICROSCOPIC ONLY
Casts, Ur, LPF, POC: NEGATIVE
Mucus, UA: NEGATIVE
YEAST UA: NEGATIVE

## 2014-11-27 LAB — POCT GLYCOSYLATED HEMOGLOBIN (HGB A1C): Hemoglobin A1C: 5.4

## 2014-11-27 MED ORDER — SULFAMETHOXAZOLE-TRIMETHOPRIM 800-160 MG PO TABS: 1.0000 | ORAL_TABLET | Freq: Two times a day (BID) | ORAL | Status: DC

## 2014-11-27 MED ORDER — HALOBETASOL PROPIONATE 0.05 % EX CREA: TOPICAL_CREAM | Freq: Two times a day (BID) | CUTANEOUS | Status: DC

## 2014-11-27 NOTE — Telephone Encounter (Signed)
Last seen 05/26/14 B [place]

## 2014-11-27 NOTE — Progress Notes (Signed)
Subjective:    Patient ID: Sabrina [name], female    DOB: [birth year], 69 y.o.   MRN: [id number]  Pt presents to the office for "cold and hot sweats for over the last 6 months". Pt states she thought it may have been her heart, but she went to her Cardiologists two months ago and was told her "heart was good". Then she went to a Pulmonologist on last Monday and was told it was not related to her "breathing problems". Pt states she stays thirsty and can't seem to get enough to drink. Pt states theses episodes come at any time of the day and she feels like she will "just black out". Pt states she sits down and drinks water and it "passes". Pt states she feels weak afterwards.  Dysuria  This is a new problem. The current episode started 1 to 4 weeks ago. The problem occurs intermittently. The problem has been waxing and waning. The quality of the pain is described as burning. The pain is mild. Associated symptoms include chills and flank pain. Pertinent negatives include no discharge, frequency, hematuria, nausea, urgency or vomiting. She has tried increased fluids for the symptoms. The treatment provided mild relief.      Review of Systems  Constitutional: Positive for chills.  HENT: Negative.   Eyes: Negative.   Respiratory: Negative.  Negative for shortness of breath.   Cardiovascular: Negative.  Negative for palpitations.  Gastrointestinal: Negative.  Negative for nausea and vomiting.  Endocrine: Negative.   Genitourinary: Positive for dysuria and flank pain. Negative for urgency, frequency and hematuria.  Musculoskeletal: Negative.   Neurological: Negative.  Negative for headaches.  Hematological: Negative.   Psychiatric/Behavioral: Negative.   All other systems reviewed and are negative.      Objective:   Physical Exam  Constitutional: She is oriented to person, place, and time. She appears well-developed and well-nourished. No distress.  HENT:  Head: Normocephalic and  atraumatic.  Right Ear: External ear normal.  Left Ear: External ear normal.  Nose: Nose normal.  Mouth/Throat: Oropharynx is clear and moist.  Eyes: Pupils are equal, round, and reactive to light.  Neck: Normal range of motion. Neck supple. No thyromegaly present.  Cardiovascular: Normal rate, regular rhythm, normal heart sounds and intact distal pulses.   No murmur heard. Pulmonary/Chest: Effort normal and breath sounds normal. No respiratory distress. She has no wheezes.  Abdominal: Soft. Bowel sounds are normal. She exhibits no distension. There is no tenderness.  Musculoskeletal: Normal range of motion. She exhibits no edema or tenderness.  Neurological: She is alert and oriented to person, place, and time. She has normal reflexes. No cranial nerve deficit.  Skin: Skin is warm and dry.  Psychiatric: She has a normal mood and affect. Her behavior is normal. Judgment and thought content normal.  Vitals reviewed.   Blood pressure 110/79, pulse 78, temperature 96.8 F (36 C), temperature source Oral, height '5\' 4"'  (1.626 m), weight 185 lb (83.915 kg).       Assessment & Plan:  1. Pain on voiding - POCT urinalysis dipstick - POCT UA - Microscopic Only - Urine culture - CMP14+EGFR  2. Other fatigue - CMP14+EGFR - POCT glycosylated hemoglobin (Hb A1C) - Thyroid Panel With TSH - Vit D  25 hydroxy (rtn osteoporosis monitoring)  3. Episode of shaking - CMP14+EGFR - POCT glycosylated hemoglobin (Hb A1C) - Thyroid Panel With TSH - Vit D  25 hydroxy (rtn osteoporosis monitoring)  4. Always thirsty - CMP14+EGFR - POCT  glycosylated hemoglobin (Hb A1C) - Thyroid Panel With TSH - Vit D  25 hydroxy (rtn osteoporosis monitoring)  5. Urinary tract infection with hematuria, site unspecified -Force fluids AZO over the counter X2 days RTO prn Culture pending - sulfamethoxazole-trimethoprim (BACTRIM DS,SEPTRA DS) 800-160 MG per tablet; Take 1 tablet by mouth 2 (two) times daily.   Dispense: 10 tablet; Refill: 0   Continue all meds Labs pending Diet and exercise encouraged RTO as needed  Evelina Dun, FNP

## 2014-11-27 NOTE — Patient Instructions (Addendum)
Hypoglycemia Hypoglycemia occurs when the glucose in your blood is too low. Glucose is a type of sugar that is your body's main energy source. Hormones, such as insulin and glucagon, control the level of glucose in the blood. Insulin lowers blood glucose and glucagon increases blood glucose. Having too much insulin in your blood stream, or not eating enough food containing sugar, can result in hypoglycemia. Hypoglycemia can happen to people with or without diabetes. It can develop quickly and can be a medical emergency.  CAUSES   Missing or delaying meals.  Not eating enough carbohydrates at meals.  Taking too much diabetes medicine.  Not timing your oral diabetes medicine or insulin doses with meals, snacks, and exercise.  Nausea and vomiting.  Certain medicines.  Severe illnesses, such as hepatitis, kidney disorders, and certain eating disorders.  Increased activity or exercise without eating something extra or adjusting medicines.  Drinking too much alcohol.  A nerve disorder that affects body functions like your heart rate, blood pressure, and digestion (autonomic neuropathy).  A condition where the stomach muscles do not function properly (gastroparesis). Therefore, medicines and food may not absorb properly.  Rarely, a tumor of the pancreas can produce too much insulin. SYMPTOMS   Hunger.  Sweating (diaphoresis).  Change in body temperature.  Shakiness.  Headache.  Anxiety.  Lightheadedness.  Irritability.  Difficulty concentrating.  Dry mouth.  Tingling or numbness in the hands or feet.  Restless sleep or sleep disturbances.  Altered speech and coordination.  Change in mental status.  Seizures or prolonged convulsions.  Combativeness.  Drowsiness (lethargic).  Weakness.  Increased heart rate or palpitations.  Confusion.  Pale, gray skin color.  Blurred or double vision.  Fainting. DIAGNOSIS  A physical exam and medical history will be  performed. Your caregiver may make a diagnosis based on your symptoms. Blood tests and other lab tests may be performed to confirm a diagnosis. Once the diagnosis is made, your caregiver will see if your signs and symptoms go away once your blood glucose is raised.  TREATMENT  Usually, you can easily treat your hypoglycemia when you notice symptoms.  Check your blood glucose. If it is less than 70 mg/dl, take one of the following:   3-4 glucose tablets.    cup juice.    cup regular soda.   1 cup skim milk.   -1 tube of glucose gel.   5-6 hard candies.   Avoid high-fat drinks or food that may delay a rise in blood glucose levels.  Do not take more than the recommended amount of sugary foods, drinks, gel, or tablets. Doing so will cause your blood glucose to go too high.   Wait 10-15 minutes and recheck your blood glucose. If it is still less than 70 mg/dl or below your target range, repeat treatment.   Eat a snack if it is more than 1 hour until your next meal.  There may be a time when your blood glucose may go so low that you are unable to treat yourself at home when you start to notice symptoms. You may need someone to help you. You may even faint or be unable to swallow. If you cannot treat yourself, someone will need to bring you to the hospital.  HOME CARE INSTRUCTIONS  If you have diabetes, follow your diabetes management plan by:  Taking your medicines as directed.  Following your exercise plan.  Following your meal plan. Do not skip meals. Eat on time.  Testing your blood   glucose regularly. Check your blood glucose before and after exercise. If you exercise longer or different than usual, be sure to check blood glucose more frequently.  Wearing your medical alert jewelry that says you have diabetes.  Identify the cause of your hypoglycemia. Then, develop ways to prevent the recurrence of hypoglycemia.  Do not take a hot bath or shower right after an  insulin shot.  Always carry treatment with you. Glucose tablets are the easiest to carry.  If you are going to drink alcohol, drink it only with meals.  Tell friends or family members ways to keep you safe during a seizure. This may include removing hard or sharp objects from the area or turning you on your side.  Maintain a healthy weight. SEEK MEDICAL CARE IF:   You are having problems keeping your blood glucose in your target range.  You are having frequent episodes of hypoglycemia.  You feel you might be having side effects from your medicines.  You are not sure why your blood glucose is dropping so low.  You notice a change in vision or a new problem with your vision. SEEK IMMEDIATE MEDICAL CARE IF:   Confusion develops.  A change in mental status occurs.  The inability to swallow develops.  Fainting occurs. Document Released: 09/08/2005 Document Revised: 09/13/2013 Document Reviewed: 01/05/2012 [facility] Patient Information 2015 [place], [place]. This information is not intended to replace advice given to you by your health care provider. Make sure you discuss any questions you have with your health care provider. Sore or Dry Mouth Care A sore or dry mouth may happen for many different reasons. Sometimes, treatment for other health problems may have to stop until your sore or dry mouth gets better.  HOME CARE  Do not smoke or chew tobacco.  Use fake (artificial) saliva when your mouth feels dry.  Use a humidifier in your bedroom at night.  Eat small meals and snacks.  Eat food cold or at room temperature.  Suck on ice-chips or try frozen ice pops or juice bars, ice-cream, and watermelon. Do not have citrus flavors.  Suck on hard, sugarless, sour candy, or chew sugarless gum to help make more saliva.  Eat soft foods such as yogurt, bananas, canned fruit, mashed potatoes, oatmeal, rice, eggs, cottage cheese, macaroni and cheese, jello, and pudding.  Microwave  vegetables and fruits to soften them.  Puree cooked food in a blender if needed.  Make dry food moist by using olive oil, gravy, or mild sauces. Dip foods in liquids.  Keep a glass of water or squirt bottle nearby. Take sips often throughout the day.  Limit caffeine.  Avoid:  Pop or fizzy drinks.  Alcohol.  Citrus juices.  Acidic food.  Salty or spicy food.  Foods or drinks that are very hot.  Hard or crunchy food. Mouth Care  Wash your hands well with soap and water before doing mouth care.  Use fake saliva as told by your doctor.  Use medicine on the sore places.  Brush your teeth at least 2 times a day. Brush after each meal if possible. Rinse your mouth with water after each meal and after drinking a sweet drink.  Brush slowly and gently in small circles. Do not brush side-to-side.  Use regular toothpastes, but stay away from ones that have sodium laurel sulfate in them.  Gargle with a baking soda mouthwash ( teaspoon baking soda mixed in with 4 cups of water).  Gargle with medicated mouthwash.  Use dental floss or dental tape to clean between your teeth every day.  Use a lanolin-based lip balm to keep your lips from getting dry.  If you wear dentures or bridges:  You may need to leave them out until your doctor tells you to start wearing them again.  Take them out at night if you wear them daily. Soak them in warm water or denture solution. Take your dentures out as much as you can during the day. Take them out when you use mouthwash.  After each meal, brush your gums gently with a soft brush and rinse your mouth with water.  If your dentures rub on your gums and cause a sore spot, have your dentist check and fix your dentures right away. GET HELP RIGHT AWAY IF:   Your mouth gets more painful or dry.  You have questions. MAKE SURE YOU:  Understand these instructions.  Will watch your condition.  Will get help right away if you are not doing  well or get worse. Document Released: 07/06/2009 Document Revised: 12/01/2011 Document Reviewed: 07/06/2009 [facility] Patient Information 2015 [place], [place]. This information is not intended to replace advice given to you by your health care provider. Make sure you discuss any questions you have with your health care provider. Urinary Tract Infection Urinary tract infections (UTIs) can develop anywhere along your urinary tract. Your urinary tract is your body's drainage system for removing wastes and extra water. Your urinary tract includes two kidneys, two ureters, a bladder, and a urethra. Your kidneys are a pair of bean-shaped organs. Each kidney is about the size of your fist. They are located below your ribs, one on each side of your spine. CAUSES Infections are caused by microbes, which are microscopic organisms, including fungi, viruses, and bacteria. These organisms are so small that they can only be seen through a microscope. Bacteria are the microbes that most commonly cause UTIs. SYMPTOMS  Symptoms of UTIs may vary by age and gender of the patient and by the location of the infection. Symptoms in young women typically include a frequent and intense urge to urinate and a painful, burning feeling in the bladder or urethra during urination. Older women and men are more likely to be tired, shaky, and weak and have muscle aches and abdominal pain. A fever may mean the infection is in your kidneys. Other symptoms of a kidney infection include pain in your back or sides below the ribs, nausea, and vomiting. DIAGNOSIS To diagnose a UTI, your caregiver will ask you about your symptoms. Your caregiver also will ask to provide a urine sample. The urine sample will be tested for bacteria and white blood cells. White blood cells are made by your body to help fight infection. TREATMENT  Typically, UTIs can be treated with medication. Because most UTIs are caused by a bacterial infection, they usually can  be treated with the use of antibiotics. The choice of antibiotic and length of treatment depend on your symptoms and the type of bacteria causing your infection. HOME CARE INSTRUCTIONS  If you were prescribed antibiotics, take them exactly as your caregiver instructs you. Finish the medication even if you feel better after you have only taken some of the medication.  Drink enough water and fluids to keep your urine clear or pale yellow.  Avoid caffeine, tea, and carbonated beverages. They tend to irritate your bladder.  Empty your bladder often. Avoid holding urine for long periods of time.  Empty your bladder before and  after sexual intercourse.  After a bowel movement, women should cleanse from front to back. Use each tissue only once. SEEK MEDICAL CARE IF:   You have back pain.  You develop a fever.  Your symptoms do not begin to resolve within 3 days. SEEK IMMEDIATE MEDICAL CARE IF:   You have severe back pain or lower abdominal pain.  You develop chills.  You have nausea or vomiting.  You have continued burning or discomfort with urination. MAKE SURE YOU:   Understand these instructions.  Will watch your condition.  Will get help right away if you are not doing well or get worse. Document Released: 06/18/2005 Document Revised: 03/09/2012 Document Reviewed: 10/17/2011 [facility] Patient Information 2015 [place], [place]. This information is not intended to replace advice given to you by your health care provider. Make sure you discuss any questions you have with your health care provider.

## 2014-11-28 LAB — CMP14+EGFR
ALK PHOS: 130 IU/L — AB (ref 39–117)
ALT: 18 IU/L (ref 0–32)
AST: 24 IU/L (ref 0–40)
Albumin/Globulin Ratio: 1.8 (ref 1.1–2.5)
Albumin: 4 g/dL (ref 3.6–4.8)
BILIRUBIN TOTAL: 0.7 mg/dL (ref 0.0–1.2)
BUN / CREAT RATIO: 18 (ref 11–26)
BUN: 14 mg/dL (ref 8–27)
CHLORIDE: 101 mmol/L (ref 97–108)
CO2: 25 mmol/L (ref 18–29)
Calcium: 9.3 mg/dL (ref 8.7–10.3)
Creatinine, Ser: 0.8 mg/dL (ref 0.57–1.00)
GFR calc non Af Amer: 77 mL/min/{1.73_m2} (ref >59)
GFR, EST AFRICAN AMERICAN: 88 mL/min/{1.73_m2} (ref >59)
Globulin, Total: 2.2 g/dL (ref 1.5–4.5)
Glucose: 74 mg/dL (ref 65–99)
Potassium: 4.1 mmol/L (ref 3.5–5.2)
Sodium: 141 mmol/L (ref 134–144)
Total Protein: 6.2 g/dL (ref 6.0–8.5)

## 2014-11-28 LAB — THYROID PANEL WITH TSH
FREE THYROXINE INDEX: 2.4 (ref 1.2–4.9)
T3 UPTAKE RATIO: 32 % (ref 24–39)
T4, Total: 7.4 ug/dL (ref 4.5–12.0)
TSH: 3.12 u[IU]/mL (ref 0.450–4.500)

## 2014-11-28 LAB — VITAMIN D 25 HYDROXY (VIT D DEFICIENCY, FRACTURES): Vit D, 25-Hydroxy: 14.7 ng/mL — ABNORMAL LOW (ref 30.0–100.0)

## 2014-11-29 ENCOUNTER — Other Ambulatory Visit: Payer: Self-pay | Admitting: Family

## 2014-11-29 DIAGNOSIS — E559 Vitamin D deficiency, unspecified: Secondary | ICD-10-CM

## 2014-11-29 LAB — URINE CULTURE

## 2014-11-29 MED ORDER — VITAMIN D (ERGOCALCIFEROL) 1.25 MG (50000 UNIT) PO CAPS: 50000.0000 [IU] | ORAL_CAPSULE | ORAL | Status: DC

## 2014-11-30 ENCOUNTER — Telehealth: Payer: Self-pay | Admitting: Family

## 2014-11-30 NOTE — Telephone Encounter (Signed)
Left message, please call. 

## 2014-12-01 ENCOUNTER — Telehealth: Payer: Self-pay | Admitting: Family

## 2014-12-05 ENCOUNTER — Ambulatory Visit (INDEPENDENT_AMBULATORY_CARE_PROVIDER_SITE_OTHER): Payer: Commercial Managed Care - HMO | Admitting: *Deleted

## 2014-12-05 DIAGNOSIS — E538 Deficiency of other specified B group vitamins: Secondary | ICD-10-CM

## 2014-12-05 MED ORDER — CYANOCOBALAMIN 1000 MCG/ML IJ SOLN
1000.0000 ug | INTRAMUSCULAR | Status: AC
  Administered 2014-12-05 – 2015-03-29 (×4): 1000 ug via INTRAMUSCULAR

## 2014-12-05 NOTE — Patient Instructions (Signed)

## 2014-12-05 NOTE — Progress Notes (Signed)
Vitamin b12 injection given and tolerated well.  

## 2014-12-08 NOTE — Telephone Encounter (Signed)
Patient aware of results on 3/11

## 2014-12-25 ENCOUNTER — Ambulatory Visit (INDEPENDENT_AMBULATORY_CARE_PROVIDER_SITE_OTHER): Payer: Commercial Managed Care - HMO | Admitting: Emergency Medicine

## 2014-12-25 ENCOUNTER — Ambulatory Visit (INDEPENDENT_AMBULATORY_CARE_PROVIDER_SITE_OTHER): Payer: Commercial Managed Care - HMO | Admitting: Adult Health

## 2014-12-25 ENCOUNTER — Other Ambulatory Visit: Payer: Self-pay | Admitting: Nurse Practitioner

## 2014-12-25 ENCOUNTER — Encounter: Payer: Self-pay | Admitting: Adult Health

## 2014-12-25 VITALS — BP 112/81 | HR 85 | Temp 98.0°F | Ht 64.0 in

## 2014-12-25 DIAGNOSIS — R0602 Shortness of breath: Secondary | ICD-10-CM | POA: Diagnosis not present

## 2014-12-25 LAB — PULMONARY FUNCTION TEST
DL/VA % pred: 78 %
DL/VA: 3.86 ml/min/mmHg/L
DLCO unc % pred: 75 %
DLCO unc: 19.44 ml/min/mmHg
FEF 25-75 Post: 3.37 L/sec
FEF 25-75 Pre: 2.93 L/sec
FEF2575-%Change-Post: 14 %
FEF2575-%Pred-Post: 161 %
FEF2575-%Pred-Pre: 141 %
FEV1-%Change-Post: 0 %
FEV1-%Pred-Post: 107 %
FEV1-%Pred-Pre: 106 %
FEV1-Post: 2.62 L
FEV1-Pre: 2.61 L
FEV1FVC-%Change-Post: 0 %
FEV1FVC-%Pred-Pre: 109 %
FEV6-%Change-Post: 0 %
FEV6-%Pred-Post: 101 %
FEV6-%Pred-Pre: 101 %
FEV6-Post: 3.12 L
FEV6-Pre: 3.1 L
FEV6FVC-%Pred-Post: 104 %
FEV6FVC-%Pred-Pre: 104 %
FVC-%Change-Post: 0 %
FVC-%Pred-Post: 97 %
FVC-%Pred-Pre: 97 %
FVC-Post: 3.12 L
FVC-Pre: 3.12 L
Post FEV1/FVC ratio: 84 %
Post FEV6/FVC ratio: 100 %
Pre FEV1/FVC ratio: 83 %
Pre FEV6/FVC Ratio: 100 %
RV % pred: 85 %
RV: 1.88 L
TLC % pred: 95 %
TLC: 4.95 L

## 2014-12-25 NOTE — Patient Instructions (Signed)
Continue on Symbicort 2 puffs Twice daily  , rinse after use.  Follow up Dr. Lamonte Sakai  In 3 months and As needed

## 2014-12-25 NOTE — Progress Notes (Signed)
PFT done today. 

## 2014-12-25 NOTE — Progress Notes (Signed)
Subjective:    Patient ID: Sabrina [name], female    DOB: 05/27/1946, 69 y.o.   MRN: [id number]  HPI 69 yo woman, hx of tobacco use (30 pk-yrs), depression, MVP c/b CHF.    11/20/14 IOV  May have had asthma a girl. She has been evaluated for dyspnea and edema, with a reassuring echocardiogram and stress myoview end of 2015. She is referred for further evaluation. She has been working on her exercise routine since her eval by Dr Lovena Le. She still has to stop to rest during the exercise - she feels a heaviness in her chest, a "closing up" in her chest, sometimes also in her UA, nasal passages / head.  She is on symbicort, has been on it for 3 yrs > it does help her. She has some periods of confusion often w exertion, associated with tremor and sweating.    12/25/2014 Follow up  Patient returns for a one-month follow-up She was seen last visit for initial evaluation for asthma. Patient was on Symbicort, Spiriva was added to her regimen. Started on Flonase and claritin for AR triggers  Patient did not like Spiriva and therefore stopped it.  She underwent PFTs today that showed an FEV1 at 2.61 L, 106% of the predicted, ratio 83, FVC 97%, diffusing capacity 75%, TLC 95%., no BD changes  Previous 2-D echo July 2015 showed mild LVH, EF of 80%, grade 2 diastolic dysfunction.. Stress Myoview November 2015 with no acute ischemic changes. Denies having a cough, wheezing, chest tightness/congestion, n/v/d or palpitations, calf pain, syncope fever.  She does feel slightly better with breathing but get winded with easily .    Review of Systems  Constitutional:   No  weight loss, night sweats,  Fevers, chills,  +fatigue, or  lassitude.  HEENT:   No headaches,  Difficulty swallowing,  Tooth/dental problems, or  Sore throat,                No sneezing, itching, ear ache +, nasal congestion, post nasal drip,   CV:  No chest pain,  Orthopnea, PND, swelling in lower extremities, anasarca, dizziness,  palpitations, syncope.   GI  No heartburn, indigestion, abdominal pain, nausea, vomiting, diarrhea, change in bowel habits, loss of appetite, bloody stools.   Resp:   No excess mucus, no productive cough,  No non-productive cough,  No coughing up of blood.  No change in color of mucus.  No wheezing.  No chest wall deformity  Skin: no rash or lesions.  GU: no dysuria, change in color of urine, no urgency or frequency.  No flank pain, no hematuria   MS:  No joint pain or swelling.  No decreased range of motion.  No back pain.  Psych:  No change in mood or affect. No depression or anxiety.  No memory loss.             Objective:   Physical Exam  Gen: Pleasant, overwt, in no distress,  normal affect  ENT: No lesions,  mouth clear,  oropharynx clear, no postnasal drip  Neck: No JVD, no TMG, no carotid bruits  Lungs: No use of accessory muscles, clear without rales or rhonchi  Cardiovascular: RRR, heart sounds normal, no murmur or gallops, no peripheral edema  Musculoskeletal: No deformities, no cyanosis or clubbing  Neuro: alert, non focal  Skin: Warm, no lesions or rashes   07/24/14 --  Myoview stress Test  MPRESSION: 1. No reversible ischemia or infarction.  2. Normal left  ventricular wall motion.  3. Left ventricular ejection fraction 67%  4. Low-risk stress test findings*.   05/19/14 -- CXR  COMPARISON: 03/01/2014  FINDINGS: The heart size and mediastinal contours are within normal limits. Both lungs are clear. The visualized skeletal structures are unremarkable. Postop changes of the left upper quadrant. Minor thoracic atherosclerosis. Minor left base atelectasis versus scarring. Trachea is midline.  IMPRESSION: Stable chest exam. No superimposed acute process     Assessment & Plan:

## 2014-12-26 NOTE — Telephone Encounter (Signed)
Last seen 11/27/14 Sabrina [name]  If approved route to nurse to call into [facility]

## 2015-01-01 NOTE — Assessment & Plan Note (Signed)
PFT shows no sign of airflow obstruction or restriction, minimally decreased DLCO  Lungs clear on previous CXR .  Card workup with neg stress test  Echo does show GR 2 DD which could be contributing with deconditioning -does not appear to be fluid overloaded.  If persists or worsens can consider HRCT chest   Plan  Continue on Symbicort 2 puffs Twice daily  , rinse after use.  Follow up Dr. Lamonte Sakai  In 3 months and As needed

## 2015-01-04 DIAGNOSIS — F339 Major depressive disorder, recurrent, unspecified: Secondary | ICD-10-CM | POA: Diagnosis not present

## 2015-01-08 DIAGNOSIS — F339 Major depressive disorder, recurrent, unspecified: Secondary | ICD-10-CM | POA: Diagnosis not present

## 2015-01-12 ENCOUNTER — Ambulatory Visit: Payer: Commercial Managed Care - HMO | Admitting: Family

## 2015-01-12 ENCOUNTER — Ambulatory Visit (INDEPENDENT_AMBULATORY_CARE_PROVIDER_SITE_OTHER): Payer: Commercial Managed Care - HMO | Admitting: *Deleted

## 2015-01-12 DIAGNOSIS — E538 Deficiency of other specified B group vitamins: Secondary | ICD-10-CM | POA: Diagnosis not present

## 2015-01-12 DIAGNOSIS — F339 Major depressive disorder, recurrent, unspecified: Secondary | ICD-10-CM | POA: Diagnosis not present

## 2015-01-12 NOTE — Progress Notes (Signed)
Vitamin b12 given and tolerated well. 

## 2015-01-12 NOTE — Patient Instructions (Signed)

## 2015-02-01 ENCOUNTER — Encounter: Payer: Self-pay | Admitting: Family

## 2015-02-01 ENCOUNTER — Ambulatory Visit (INDEPENDENT_AMBULATORY_CARE_PROVIDER_SITE_OTHER): Payer: Commercial Managed Care - HMO | Admitting: Family

## 2015-02-01 VITALS — BP 123/86 | HR 100 | Temp 97.8°F | Ht 64.0 in | Wt 176.4 lb

## 2015-02-01 DIAGNOSIS — R300 Dysuria: Secondary | ICD-10-CM

## 2015-02-01 DIAGNOSIS — R319 Hematuria, unspecified: Secondary | ICD-10-CM | POA: Diagnosis not present

## 2015-02-01 DIAGNOSIS — N390 Urinary tract infection, site not specified: Secondary | ICD-10-CM

## 2015-02-01 DIAGNOSIS — F411 Generalized anxiety disorder: Secondary | ICD-10-CM | POA: Diagnosis not present

## 2015-02-01 LAB — POCT URINALYSIS DIPSTICK
Bilirubin, UA: NEGATIVE
Glucose, UA: NEGATIVE
Ketones, UA: NEGATIVE
Nitrite, UA: NEGATIVE
Protein, UA: NEGATIVE
Spec Grav, UA: 1.02
Urobilinogen, UA: NEGATIVE
pH, UA: 5

## 2015-02-01 LAB — POCT UA - MICROSCOPIC ONLY
Bacteria, U Microscopic: NEGATIVE
Casts, Ur, LPF, POC: NEGATIVE
Crystals, Ur, HPF, POC: NEGATIVE
Mucus, UA: NEGATIVE
YEAST UA: NEGATIVE

## 2015-02-01 MED ORDER — CIPROFLOXACIN HCL 500 MG PO TABS: 500.0000 mg | ORAL_TABLET | Freq: Two times a day (BID) | ORAL | Status: DC

## 2015-02-01 MED ORDER — ALPRAZOLAM 0.5 MG PO TABS: 0.5000 mg | ORAL_TABLET | Freq: Every evening | ORAL | Status: DC | PRN

## 2015-02-01 MED ORDER — CEFTRIAXONE SODIUM 1 G IJ SOLR
1.0000 g | Freq: Once | INTRAMUSCULAR | Status: AC
  Administered 2015-02-01: 1 g via INTRAMUSCULAR

## 2015-02-01 MED ORDER — DULOXETINE HCL 60 MG PO CPEP: 60.0000 mg | ORAL_CAPSULE | Freq: Every day | ORAL | Status: DC

## 2015-02-01 NOTE — Patient Instructions (Addendum)
Urinary Tract Infection Urinary tract infections (UTIs) can develop anywhere along your urinary tract. Your urinary tract is your body's drainage system for removing wastes and extra water. Your urinary tract includes two kidneys, two ureters, a bladder, and a urethra. Your kidneys are a pair of bean-shaped organs. Each kidney is about the size of your fist. They are located below your ribs, one on each side of your spine. CAUSES Infections are caused by microbes, which are microscopic organisms, including fungi, viruses, and bacteria. These organisms are so small that they can only be seen through a microscope. Bacteria are the microbes that most commonly cause UTIs. SYMPTOMS  Symptoms of UTIs may vary by age and gender of the patient and by the location of the infection. Symptoms in young women typically include a frequent and intense urge to urinate and a painful, burning feeling in the bladder or urethra during urination. Older women and men are more likely to be tired, shaky, and weak and have muscle aches and abdominal pain. A fever may mean the infection is in your kidneys. Other symptoms of a kidney infection include pain in your back or sides below the ribs, nausea, and vomiting. DIAGNOSIS To diagnose a UTI, your caregiver will ask you about your symptoms. Your caregiver also will ask to provide a urine sample. The urine sample will be tested for bacteria and white blood cells. White blood cells are made by your body to help fight infection. TREATMENT  Typically, UTIs can be treated with medication. Because most UTIs are caused by a bacterial infection, they usually can be treated with the use of antibiotics. The choice of antibiotic and length of treatment depend on your symptoms and the type of bacteria causing your infection. HOME CARE INSTRUCTIONS  If you were prescribed antibiotics, take them exactly as your caregiver instructs you. Finish the medication even if you feel better after you  have only taken some of the medication.  Drink enough water and fluids to keep your urine clear or pale yellow.  Avoid caffeine, tea, and carbonated beverages. They tend to irritate your bladder.  Empty your bladder often. Avoid holding urine for long periods of time.  Empty your bladder before and after sexual intercourse.  After a bowel movement, women should cleanse from front to back. Use each tissue only once. SEEK MEDICAL CARE IF:   You have back pain.  You develop a fever.  Your symptoms do not begin to resolve within 3 days. SEEK IMMEDIATE MEDICAL CARE IF:   You have severe back pain or lower abdominal pain.  You develop chills.  You have nausea or vomiting.  You have continued burning or discomfort with urination. MAKE SURE YOU:   Understand these instructions.  Will watch your condition.  Will get help right away if you are not doing well or get worse. Document Released: 06/18/2005 Document Revised: 03/09/2012 Document Reviewed: 10/17/2011 [facility] Patient Information 2015 [place], [place]. This information is not intended to replace advice given to you by your health care provider. Make sure you discuss any questions you have with your health care provider. Generalized Anxiety Disorder Generalized anxiety disorder (GAD) is a mental disorder. It interferes with life functions, including relationships, work, and school. GAD is different from normal anxiety, which everyone experiences at some point in their lives in response to specific life events and activities. Normal anxiety actually helps [place] prepare for and get through these life events and activities. Normal anxiety goes away after the event or  activity is over.  GAD causes anxiety that is not necessarily related to specific events or activities. It also causes excess anxiety in proportion to specific events or activities. The anxiety associated with GAD is also difficult to control. GAD can vary from mild to  severe. People with severe GAD can have intense waves of anxiety with physical symptoms (panic attacks).  SYMPTOMS The anxiety and worry associated with GAD are difficult to control. This anxiety and worry are related to many life events and activities and also occur more days than not for 6 months or longer. People with GAD also have three or more of the following symptoms (one or more in children):  Restlessness.   Fatigue.  Difficulty concentrating.   Irritability.  Muscle tension.  Difficulty sleeping or unsatisfying sleep. DIAGNOSIS GAD is diagnosed through an assessment by your health care provider. Your health care provider will ask you questions aboutyour mood,physical symptoms, and events in your life. Your health care provider may ask you about your medical history and use of alcohol or drugs, including prescription medicines. Your health care provider may also do a physical exam and blood tests. Certain medical conditions and the use of certain substances can cause symptoms similar to those associated with GAD. Your health care provider may refer you to a mental health specialist for further evaluation. TREATMENT The following therapies are usually used to treat GAD:   Medication. Antidepressant medication usually is prescribed for long-term daily control. Antianxiety medicines may be added in severe cases, especially when panic attacks occur.   Talk therapy (psychotherapy). Certain types of talk therapy can be helpful in treating GAD by providing support, education, and guidance. A form of talk therapy called cognitive behavioral therapy can teach you healthy ways to think about and react to daily life events and activities.  Stress managementtechniques. These include yoga, meditation, and exercise and can be very helpful when they are practiced regularly. A mental health specialist can help determine which treatment is best for you. Some people see improvement with one  therapy. However, other people require a combination of therapies. Document Released: 01/03/2013 Document Revised: 01/23/2014 Document Reviewed: 01/03/2013 [facility] Patient Information 2015 [place], [place]. This information is not intended to replace advice given to you by your health care provider. Make sure you discuss any questions you have with your health care provider.

## 2015-02-01 NOTE — Progress Notes (Signed)
Subjective:    Patient ID: Sabrina [name], female    DOB: Jul 10, 1946, 69 y.o.   MRN: [id number]  Dysuria  This is a new problem. The current episode started in the past 7 days. The problem occurs every urination. The problem has been gradually worsening. The quality of the pain is described as shooting, aching and burning. The pain is at a severity of 6/10. The pain is mild. Associated symptoms include a discharge, flank pain, frequency, hematuria, hesitancy, sweats and urgency. Pertinent negatives include no chills, nausea or vomiting. She has tried increased fluids for the symptoms. The treatment provided mild relief.  Anxiety Presents for follow-up visit. Onset was 1 to 6 months ago. Symptoms include excessive worry, nervous/anxious behavior and panic. Patient reports no decreased concentration, irritability, nausea, palpitations or shortness of breath. Symptoms occur occasionally. The symptoms are aggravated by family issues (Pt has full custody of grandson who is a "handful" and getting in "troble with the law"). The quality of sleep is fair.   Her past medical history is significant for anxiety/panic attacks and depression.      Review of Systems  Constitutional: Negative.  Negative for chills and irritability.  HENT: Negative.   Eyes: Negative.   Respiratory: Negative.  Negative for shortness of breath.   Cardiovascular: Negative.  Negative for palpitations.  Gastrointestinal: Negative.  Negative for nausea and vomiting.  Endocrine: Negative.   Genitourinary: Positive for dysuria, hesitancy, urgency, frequency, hematuria and flank pain.  Musculoskeletal: Negative.   Neurological: Negative.  Negative for headaches.  Hematological: Negative.   Psychiatric/Behavioral: Negative for decreased concentration. The patient is nervous/anxious.   All other systems reviewed and are negative.      Objective:   Physical Exam  Constitutional: She is oriented to person, place, and time.  She appears well-developed and well-nourished. No distress.  HENT:  Head: Normocephalic and atraumatic.  Eyes: Pupils are equal, round, and reactive to light.  Neck: Normal range of motion. Neck supple. No thyromegaly present.  Cardiovascular: Normal rate, regular rhythm, normal heart sounds and intact distal pulses.   No murmur heard. Pulmonary/Chest: Effort normal and breath sounds normal. No respiratory distress. She has no wheezes.  Abdominal: Soft. Bowel sounds are normal. She exhibits no distension. There is no tenderness.  Musculoskeletal: Normal range of motion. She exhibits no edema or tenderness.  Mild CVA tenderness present   Neurological: She is alert and oriented to person, place, and time. She has normal reflexes. No cranial nerve deficit.  Skin: Skin is warm and dry.  Psychiatric: She has a normal mood and affect. Her behavior is normal. Judgment and thought content normal.  Vitals reviewed.    BP 123/86 mmHg  Pulse 100  Temp(Src) 97.8 F (36.6 C) (Oral)  Ht 5\' 4"  (1.626 m)  Wt 176 lb 6.4 oz (80.015 kg)  BMI 30.26 kg/m2      Assessment & Plan:  1. Dysuria - POCT urinalysis dipstick - POCT UA - Microscopic Only  2. Urinary tract infection with hematuria, site unspecified Force fluids AZO over the counter X2 days RTO prn Culture pending - cefTRIAXone (ROCEPHIN) injection 1 g; Inject 1 g into the muscle once. - ciprofloxacin (CIPRO) 500 MG tablet; Take 1 tablet (500 mg total) by mouth 2 (two) times daily.  Dispense: 10 tablet; Refill: 0  3. GAD (generalized anxiety disorder) -Stress management -Pt encouraged to get help for grandson- Yolanda Bonine has been suspended for bomb threats and bring bullet proof jacket to school -RTO  in 2 weeks to discuss GAD - DULoxetine (CYMBALTA) 60 MG capsule; Take 1 capsule (60 mg total) by mouth daily.  Dispense: 90 capsule; Refill: 3 - ALPRAZolam (XANAX) 0.5 MG tablet; Take 1 tablet (0.5 mg total) by mouth at bedtime as needed  for anxiety.  Dispense: 45 tablet; Refill: Britton, FNP

## 2015-02-02 LAB — URINE CULTURE

## 2015-02-20 ENCOUNTER — Ambulatory Visit (INDEPENDENT_AMBULATORY_CARE_PROVIDER_SITE_OTHER): Payer: Commercial Managed Care - HMO | Admitting: Nurse Practitioner

## 2015-02-20 ENCOUNTER — Encounter: Payer: Self-pay | Admitting: Nurse Practitioner

## 2015-02-20 VITALS — BP 103/72 | HR 94 | Temp 96.9°F | Ht 64.0 in | Wt 176.0 lb

## 2015-02-20 DIAGNOSIS — N3001 Acute cystitis with hematuria: Secondary | ICD-10-CM | POA: Diagnosis not present

## 2015-02-20 DIAGNOSIS — R300 Dysuria: Secondary | ICD-10-CM | POA: Diagnosis not present

## 2015-02-20 DIAGNOSIS — E538 Deficiency of other specified B group vitamins: Secondary | ICD-10-CM

## 2015-02-20 LAB — POCT URINALYSIS DIPSTICK
Bilirubin, UA: NEGATIVE
Glucose, UA: NEGATIVE
Ketones, UA: NEGATIVE
Nitrite, UA: NEGATIVE
PROTEIN UA: NEGATIVE
Spec Grav, UA: <=1.005
UROBILINOGEN UA: NEGATIVE
pH, UA: 6.5

## 2015-02-20 LAB — POCT UA - MICROSCOPIC ONLY
Bacteria, U Microscopic: NEGATIVE
Casts, Ur, LPF, POC: NEGATIVE
Crystals, Ur, HPF, POC: NEGATIVE
Mucus, UA: NEGATIVE
Yeast, UA: NEGATIVE

## 2015-02-20 MED ORDER — SULFAMETHOXAZOLE-TRIMETHOPRIM 800-160 MG PO TABS: 1.0000 | ORAL_TABLET | Freq: Two times a day (BID) | ORAL | Status: DC

## 2015-02-20 NOTE — Patient Instructions (Signed)
Take medication as prescribe Cotton underwear Take shower not bath Cranberry juice, yogurt Force fluids AZO over the counter X2 days Culture pending RTO prn  

## 2015-02-20 NOTE — Progress Notes (Signed)
   Subjective:    Patient ID: Sabrina [name], female    DOB: 11-Dec-1945, 69 y.o.   MRN: [id number]  HPI Patient in today for recheck of urine- SHe was seen by C. Hawks on 02/01/15 and was dx with UTI. Was given antibiotic. Today she is sweaty and chilling with lower back pain- no dysuira- positive fregeency with only scant amounts at a time.    Review of Systems  Constitutional: Negative.   HENT: Negative.   Respiratory: Negative.   Cardiovascular: Negative.   Genitourinary: Negative.   Neurological: Negative.   Psychiatric/Behavioral: Negative.   All other systems reviewed and are negative.      Objective:   Physical Exam  Constitutional: She is oriented to person, place, and time. She appears well-developed and well-nourished.  Cardiovascular: Normal rate, regular rhythm and normal heart sounds.   Pulmonary/Chest: Effort normal and breath sounds normal.  Abdominal: Soft. Distention: mild suprapubis pain on palaption. There is tenderness.  Genitourinary:  Mild left CVA tenderness on palpation  Neurological: She is alert and oriented to person, place, and time.  Skin: Skin is warm.  Psychiatric: She has a normal mood and affect. Her behavior is normal. Judgment and thought content normal.   BP 103/72 mmHg  Pulse 94  Temp(Src) 96.9 F (36.1 C) (Oral)  Ht 5\' 4"  (1.626 m)  Wt 176 lb (79.833 kg)  BMI 30.20 kg/m2        Assessment & Plan:   1. Dysuria   2. Acute cystitis with hematuria    Meds ordered this encounter  Medications  . sulfamethoxazole-trimethoprim (BACTRIM DS) 800-160 MG per tablet    Sig: Take 1 tablet by mouth 2 (two) times daily.    Dispense:  14 tablet    Refill:  0    Order Specific Question:  Supervising Provider    Answer:  Chipper Herb [[id number]]   Take medication as prescribe Cotton underwear Take shower not bath Cranberry juice, yogurt Force fluids AZO over the counter X2 days Culture pending RTO prn  Mary-Margaret Hassell Done, FNP

## 2015-02-21 ENCOUNTER — Other Ambulatory Visit: Payer: Self-pay | Admitting: Family Medicine

## 2015-02-23 LAB — URINE CULTURE

## 2015-03-29 ENCOUNTER — Ambulatory Visit (INDEPENDENT_AMBULATORY_CARE_PROVIDER_SITE_OTHER): Payer: Commercial Managed Care - HMO | Admitting: *Deleted

## 2015-03-29 DIAGNOSIS — H2513 Age-related nuclear cataract, bilateral: Secondary | ICD-10-CM | POA: Diagnosis not present

## 2015-03-29 DIAGNOSIS — H5200 Hypermetropia, unspecified eye: Secondary | ICD-10-CM | POA: Diagnosis not present

## 2015-03-29 DIAGNOSIS — E538 Deficiency of other specified B group vitamins: Secondary | ICD-10-CM

## 2015-03-29 DIAGNOSIS — H5210 Myopia, unspecified eye: Secondary | ICD-10-CM | POA: Diagnosis not present

## 2015-03-29 NOTE — Patient Instructions (Signed)

## 2015-03-29 NOTE — Progress Notes (Signed)
Vitamin b12 injection given and tolerated well.  

## 2015-04-06 ENCOUNTER — Other Ambulatory Visit: Payer: Self-pay | Admitting: Nurse Practitioner

## 2015-04-06 NOTE — Telephone Encounter (Signed)
rx called into pharmacy

## 2015-04-20 ENCOUNTER — Other Ambulatory Visit: Payer: Self-pay | Admitting: Internal Medicine

## 2015-05-10 ENCOUNTER — Other Ambulatory Visit: Payer: Self-pay | Admitting: Nurse Practitioner

## 2015-05-14 ENCOUNTER — Other Ambulatory Visit: Payer: Self-pay | Admitting: Nurse Practitioner

## 2015-05-14 NOTE — Telephone Encounter (Signed)
Last filled 04/06/15, last seen 02/01/15. Route to pool. [facility]

## 2015-05-22 ENCOUNTER — Other Ambulatory Visit: Payer: Self-pay | Admitting: *Deleted

## 2015-05-22 MED ORDER — LORATADINE 10 MG PO TABS: 10.0000 mg | ORAL_TABLET | Freq: Every day | ORAL | Status: DC

## 2015-06-12 ENCOUNTER — Other Ambulatory Visit: Payer: Self-pay | Admitting: Nurse Practitioner

## 2015-08-22 ENCOUNTER — Telehealth: Payer: Self-pay | Admitting: Nurse Practitioner

## 2015-08-23 NOTE — Telephone Encounter (Signed)
Chart reviewed. Patient needs a yearly physical. She is also due for several health maintenance items. We should start with a physical. An FOBT should be given at that visit. A Colonoscopy can be ordered but would not be completed by years end. The FOBT would close colon CA screening gap for the year. She also needs a mammogram, dexa scan, and pneumonia vaccine.

## 2015-08-28 ENCOUNTER — Ambulatory Visit (INDEPENDENT_AMBULATORY_CARE_PROVIDER_SITE_OTHER): Payer: Commercial Managed Care - HMO

## 2015-08-28 DIAGNOSIS — Z23 Encounter for immunization: Secondary | ICD-10-CM | POA: Diagnosis not present

## 2015-08-29 ENCOUNTER — Other Ambulatory Visit: Payer: Commercial Managed Care - HMO

## 2015-08-29 DIAGNOSIS — Z1212 Encounter for screening for malignant neoplasm of rectum: Secondary | ICD-10-CM | POA: Diagnosis not present

## 2015-08-30 LAB — FECAL OCCULT BLOOD, IMMUNOCHEMICAL: Fecal Occult Bld: NEGATIVE

## 2015-10-03 ENCOUNTER — Other Ambulatory Visit: Payer: Self-pay | Admitting: Internal Medicine

## 2015-10-19 ENCOUNTER — Ambulatory Visit (INDEPENDENT_AMBULATORY_CARE_PROVIDER_SITE_OTHER): Payer: Commercial Managed Care - HMO

## 2015-10-19 ENCOUNTER — Encounter: Payer: Self-pay | Admitting: Family

## 2015-10-19 ENCOUNTER — Ambulatory Visit (INDEPENDENT_AMBULATORY_CARE_PROVIDER_SITE_OTHER): Payer: Commercial Managed Care - HMO | Admitting: Family

## 2015-10-19 VITALS — BP 122/83 | HR 82 | Temp 97.1°F | Ht 64.0 in | Wt 178.8 lb

## 2015-10-19 DIAGNOSIS — Z13220 Encounter for screening for lipoid disorders: Secondary | ICD-10-CM

## 2015-10-19 DIAGNOSIS — Z23 Encounter for immunization: Secondary | ICD-10-CM

## 2015-10-19 DIAGNOSIS — J4520 Mild intermittent asthma, uncomplicated: Secondary | ICD-10-CM

## 2015-10-19 DIAGNOSIS — F329 Major depressive disorder, single episode, unspecified: Secondary | ICD-10-CM | POA: Insufficient documentation

## 2015-10-19 DIAGNOSIS — D509 Iron deficiency anemia, unspecified: Secondary | ICD-10-CM | POA: Diagnosis not present

## 2015-10-19 DIAGNOSIS — M792 Neuralgia and neuritis, unspecified: Secondary | ICD-10-CM

## 2015-10-19 DIAGNOSIS — F32A Depression, unspecified: Secondary | ICD-10-CM | POA: Insufficient documentation

## 2015-10-19 DIAGNOSIS — Z780 Asymptomatic menopausal state: Secondary | ICD-10-CM

## 2015-10-19 DIAGNOSIS — Z1211 Encounter for screening for malignant neoplasm of colon: Secondary | ICD-10-CM

## 2015-10-19 DIAGNOSIS — Z01419 Encounter for gynecological examination (general) (routine) without abnormal findings: Secondary | ICD-10-CM | POA: Diagnosis not present

## 2015-10-19 DIAGNOSIS — F411 Generalized anxiety disorder: Secondary | ICD-10-CM | POA: Insufficient documentation

## 2015-10-19 DIAGNOSIS — J309 Allergic rhinitis, unspecified: Secondary | ICD-10-CM | POA: Diagnosis not present

## 2015-10-19 DIAGNOSIS — R5383 Other fatigue: Secondary | ICD-10-CM | POA: Diagnosis not present

## 2015-10-19 DIAGNOSIS — G4700 Insomnia, unspecified: Secondary | ICD-10-CM | POA: Diagnosis not present

## 2015-10-19 DIAGNOSIS — I10 Essential (primary) hypertension: Secondary | ICD-10-CM

## 2015-10-19 DIAGNOSIS — Z1159 Encounter for screening for other viral diseases: Secondary | ICD-10-CM | POA: Diagnosis not present

## 2015-10-19 DIAGNOSIS — E559 Vitamin D deficiency, unspecified: Secondary | ICD-10-CM | POA: Diagnosis not present

## 2015-10-19 DIAGNOSIS — J45909 Unspecified asthma, uncomplicated: Secondary | ICD-10-CM | POA: Insufficient documentation

## 2015-10-19 DIAGNOSIS — I5032 Chronic diastolic (congestive) heart failure: Secondary | ICD-10-CM | POA: Diagnosis not present

## 2015-10-19 LAB — POCT URINALYSIS DIPSTICK
Bilirubin, UA: NEGATIVE
Blood, UA: NEGATIVE
Glucose, UA: NEGATIVE
Ketones, UA: NEGATIVE
Leukocytes, UA: NEGATIVE
NITRITE UA: NEGATIVE
PH UA: 8
Protein, UA: NEGATIVE
Spec Grav, UA: <=1.005
Urobilinogen, UA: NEGATIVE

## 2015-10-19 LAB — POCT UA - MICROSCOPIC ONLY
Bacteria, U Microscopic: NEGATIVE
CASTS, UR, LPF, POC: NEGATIVE
CRYSTALS, UR, HPF, POC: NEGATIVE
MUCUS UA: NEGATIVE
RBC, urine, microscopic: NEGATIVE

## 2015-10-19 MED ORDER — PNEUMOCOCCAL 13-VAL CONJ VACC IM SUSP
0.5000 mL | INTRAMUSCULAR | Status: DC
Start: 2015-10-20 — End: 2015-10-19

## 2015-10-19 MED ORDER — GABAPENTIN 100 MG PO CAPS: ORAL_CAPSULE | ORAL | Status: DC

## 2015-10-19 NOTE — Patient Instructions (Signed)
Neuropathic Pain Neuropathic pain is pain caused by damage to the nerves that are responsible for certain sensations in your body (sensory nerves). The pain can be caused by damage to:   The sensory nerves that send signals to your spinal cord and brain (peripheral nervous system).  The sensory nerves in your brain or spinal cord (central nervous system). Neuropathic pain can make you more sensitive to pain. What would be a minor sensation for most people may feel very painful if you have neuropathic pain. This is usually a long-term condition that can be difficult to treat. The type of pain can differ from person to person. It may start suddenly (acute), or it may develop slowly and last for a long time (chronic). Neuropathic pain may come and go as damaged nerves heal or may stay at the same level for years. It often causes emotional distress, loss of sleep, and a lower quality of life. CAUSES  The most common cause of damage to a sensory nerve is diabetes. Many other diseases and conditions can also cause neuropathic pain. Causes of neuropathic pain can be classified as:  Toxic. Many drugs and chemicals can cause toxic damage. The most common cause of toxic neuropathic pain is damage from drug treatment for cancer (chemotherapy).  Metabolic. This type of pain can happen when a disease causes imbalances that damage nerves. Diabetes is the most common of these diseases. Vitamin B deficiency caused by long-term alcohol abuse is another common cause.  Traumatic. Any injury that cuts, crushes, or stretches a nerve can cause damage and pain. A common example is feeling pain after losing an arm or leg (phantom limb pain).  Compression-related. If a sensory nerve gets trapped or compressed for a long period of time, the blood supply to the nerve can be cut off.  Vascular. Many blood vessel diseases can cause neuropathic pain by decreasing blood supply and oxygen to nerves.  Autoimmune. This type of  pain results from diseases in which the body's defense system mistakenly attacks sensory nerves. Examples of autoimmune diseases that can cause neuropathic pain include lupus and multiple sclerosis.  Infectious. Many types of viral infections can damage sensory nerves and cause pain. Shingles infection is a common cause of this type of pain.  Inherited. Neuropathic pain can be a symptom of many diseases that are passed down through families (genetic). SIGNS AND SYMPTOMS  The main symptom is pain. Neuropathic pain is often described as:  Burning.  Shock-like.  Stinging.  Hot or cold.  Itching. DIAGNOSIS  No single test can diagnose neuropathic pain. Your health care provider will do a physical exam and ask you about your pain. You may use a pain scale to describe how bad your pain is. You may also have tests to see if you have a high sensitivity to pain and to help find the cause and location of any sensory nerve damage. These tests may include:  Imaging studies, such as:  X-rays.  CT scan.  MRI.  Nerve conduction studies to test how well nerve signals travel through your sensory nerves (electrodiagnostic testing).  Stimulating your sensory nerves through electrodes on your skin and measuring the response in your spinal cord and brain (somatosensory evoked potentials). TREATMENT  Treatment for neuropathic pain may change over time. You may need to try different treatment options or a combination of treatments. Some options include:  Over-the-counter pain relievers.  Prescription medicines. Some medicines used to treat other conditions may also help neuropathic pain. These   include medicines to:  Control seizures (anticonvulsants).  Relieve depression (antidepressants).  Prescription-strength pain relievers (narcotics). These are usually used when other pain relievers do not help.  Transcutaneous nerve stimulation (TENS). This uses electrical currents to block painful nerve  signals. The treatment is painless.  Topical and local anesthetics. These are medicines that numb the nerves. They can be injected as a nerve block or applied to the skin.  Alternative treatments, such as:  Acupuncture.  Meditation.  Massage.  Physical therapy.  Pain management programs.  Counseling. HOME CARE INSTRUCTIONS  Learn as much as you can about your condition.  Take medicines only as directed by your health care provider.  Work closely with all your health care providers to find what works best for you.  Have a good support system at home.  Consider joining a chronic pain support group. SEEK MEDICAL CARE IF:  Your pain treatments are not helping.  You are having side effects from your medicines.  You are struggling with fatigue, mood changes, depression, or anxiety.   This information is not intended to replace advice given to you by your health care provider. Make sure you discuss any questions you have with your health care provider.   Document Released: 06/05/2004 Document Revised: 09/29/2014 Document Reviewed: 02/16/2014 Elsevier Interactive Patient Education 2016 [employer]  

## 2015-10-19 NOTE — Addendum Note (Signed)
Addended by: Earlene Plater on: 10/19/2015 11:05 AM   Modules accepted: Miquel Dunn

## 2015-10-19 NOTE — Addendum Note (Signed)
Addended by: Shelbie Ammons on: 10/19/2015 11:09 AM   Modules accepted: Orders

## 2015-10-19 NOTE — Progress Notes (Signed)
Subjective:    Patient ID: Sabrina [name], female    DOB: 11-15-1945, 71 y.o.   MRN: [id number]  PT presents to the office today for CPE with pap.  Gynecologic Exam Pertinent negatives include no headaches or sore throat.  Hypertension This is a chronic problem. The current episode started more than 1 year ago. The problem has been resolved since onset. The problem is controlled. Associated symptoms include anxiety and peripheral edema. Pertinent negatives include no headaches, palpitations or shortness of breath. Risk factors for coronary artery disease include family history, obesity, post-menopausal state and sedentary lifestyle. Past treatments include diuretics. The current treatment provides moderate improvement. Hypertensive end-organ damage includes CAD/MI. There is no history of kidney disease, CVA, heart failure or a thyroid problem. There is no history of sleep apnea.  Asthma She complains of cough and wheezing ("at times"). There is no shortness of breath. Primary symptoms comments: PT states she has a "cold" that started two days and has made her asthma flare up. . This is a chronic problem. The current episode started more than 1 year ago. The problem occurs 2 to 4 times per day. The problem has been waxing and waning. The cough is non-productive. Associated symptoms include ear pain (right ear) and nasal congestion. Pertinent negatives include no ear congestion, headaches or sore throat. Her symptoms are aggravated by pollen. Her past medical history is significant for asthma. There is no history of COPD.  Anxiety Presents for follow-up visit. Onset was more than 5 years ago. The problem has been waxing and waning. Symptoms include depressed mood, excessive worry, insomnia and nervous/anxious behavior. Patient reports no confusion, palpitations, restlessness or shortness of breath. Symptoms occur occasionally.   Her past medical history is significant for anemia, anxiety/panic  attacks, asthma and depression. Past treatments include non-SSRI antidepressants. Compliance with prior treatments has been good.  Depression      The patient presents with depression.  This is a chronic problem.  The onset quality is gradual.   The problem occurs intermittently.  The problem has been gradually improving since onset.  Associated symptoms include insomnia.  Associated symptoms include no fatigue, no helplessness, no hopelessness, no restlessness and no headaches.  Past treatments include SNRIs - Serotonin and norepinephrine reuptake inhibitors.  Past medical history includes anxiety and depression.     Pertinent negatives include no thyroid problem. Insomnia Primary symptoms: difficulty falling asleep, frequent awakening.  The current episode started more than one year. The onset quality is gradual. The problem occurs every several days. The problem has been waxing and waning since onset. The symptoms are aggravated by anxiety. PMH includes: depression.  Anemia Presents for follow-up visit. Symptoms include bruises/bleeds easily. There has been no confusion, light-headedness or palpitations. Past treatments include oral iron supplements. There is no history of heart failure. Family history includes iron deficiency.      Review of Systems  Constitutional: Negative.  Negative for fatigue.  HENT: Positive for ear pain (right ear). Negative for sore throat.   Eyes: Negative.   Respiratory: Positive for cough and wheezing ("at times"). Negative for shortness of breath.   Cardiovascular: Negative.  Negative for palpitations.  Gastrointestinal: Negative.   Endocrine: Negative.   Genitourinary: Negative.   Musculoskeletal: Negative.   Neurological: Negative.  Negative for light-headedness and headaches.  Hematological: Bruises/bleeds easily.  Psychiatric/Behavioral: Positive for depression. Negative for confusion. The patient is nervous/anxious and has insomnia.   All other systems  reviewed and are negative.  Objective:   Physical Exam  Constitutional: She is oriented to person, place, and time. She appears well-developed and well-nourished. No distress.  HENT:  Head: Normocephalic and atraumatic.  Right Ear: External ear normal.  Left Ear: External ear normal.  Nose: Nose normal.  Mouth/Throat: Oropharynx is clear and moist.  Eyes: Pupils are equal, round, and reactive to light.  Neck: Normal range of motion. Neck supple. No thyromegaly present.  Cardiovascular: Normal rate, regular rhythm, normal heart sounds and intact distal pulses.   No murmur heard. Pulmonary/Chest: Effort normal and breath sounds normal. No respiratory distress. She has no wheezes. Right breast exhibits no inverted nipple, no mass, no nipple discharge, no skin change and no tenderness. Left breast exhibits no inverted nipple, no mass, no nipple discharge, no skin change and no tenderness. Breasts are symmetrical.  Abdominal: Soft. Bowel sounds are normal. She exhibits no distension. There is no tenderness.  Genitourinary: Vagina normal.  Bimanual exam- no adnexal masses or tenderness, ovaries nonpalpable   No Cervix present- No discharge   Musculoskeletal: Normal range of motion. She exhibits no edema or tenderness.  Neurological: She is alert and oriented to person, place, and time. She has normal reflexes. No cranial nerve deficit.  Skin: Skin is warm and dry.  Psychiatric: She has a normal mood and affect. Her behavior is normal. Judgment and thought content normal.  Vitals reviewed.     Blood pressure 122/83, pulse 82, temperature 97.1 F (36.2 C), temperature source Oral, height '5\' 4"'  (1.626 m), weight 178 lb 12.8 oz (81.103 kg).     Assessment & Plan:  1. Encounter for routine gynecological examination - POCT urinalysis dipstick - POCT UA - Microscopic Only - CMP14+EGFR - Pap IG w/ reflex to HPV when ASC-U - pneumococcal 13-valent conjugate vaccine (PREVNAR 13)  injection 0.5 mL; Inject 0.5 mLs into the muscle tomorrow at 10 am.  2. Chronic diastolic heart failure (HCC) - CMP14+EGFR  3. Benign essential HTN - CMP14+EGFR  4. Allergic rhinitis, unspecified allergic rhinitis type - CMP14+EGFR  5. GAD (generalized anxiety disorder) - CMP14+EGFR  6. Morbid obesity, unspecified obesity type (HCC) - CMP14+EGFR  7. Vitamin D deficiency - CMP14+EGFR - VITAMIN D 25 Hydroxy (Vit-D Deficiency, Fractures)  8. Asthma, mild intermittent, uncomplicated - TCY81+YHTM  9. Generalized anxiety disorder - CMP14+EGFR  10. Depression - CMP14+EGFR  11. Insomnia - CMP14+EGFR  12. Iron deficiency anemia - Anemia Profile B - CMP14+EGFR  13. Post-menopausal - CMP14+EGFR - DG Bone Density; Future  14. Need for hepatitis C screening test - CMP14+EGFR - Hepatitis C antibody  15. Screening for hyperlipidemia - CMP14+EGFR - Lipid panel  16. Other fatigue - Anemia Profile B - CMP14+EGFR - Thyroid Panel With TSH - VITAMIN D 25 Hydroxy (Vit-D Deficiency, Fractures)  17. Colon cancer screening - Ambulatory referral to Gastroenterology  18. Neuropathic pain -PT started on gabapentin today - gabapentin (NEURONTIN) 100 MG capsule; Take 1 Tab for 1 day, then 1 tab twice a day, then 1 tab TID  Dispense: 90 capsule; Refill: 3   Continue all meds Labs pending Health Maintenance reviewed- Mammogram scheduled for next month Diet and exercise encouraged RTO 6 months  Evelina Dun, FNP

## 2015-10-20 ENCOUNTER — Other Ambulatory Visit: Payer: Self-pay | Admitting: Family

## 2015-10-20 LAB — CMP14+EGFR
A/G RATIO: 1.5 (ref 1.1–2.5)
ALK PHOS: 117 IU/L (ref 39–117)
ALT: 18 IU/L (ref 0–32)
AST: 25 IU/L (ref 0–40)
Albumin: 3.9 g/dL (ref 3.6–4.8)
BILIRUBIN TOTAL: 0.4 mg/dL (ref 0.0–1.2)
BUN / CREAT RATIO: 24 (ref 11–26)
BUN: 19 mg/dL (ref 8–27)
CHLORIDE: 98 mmol/L (ref 96–106)
CO2: 24 mmol/L (ref 18–29)
Calcium: 8.9 mg/dL (ref 8.7–10.3)
Creatinine, Ser: 0.8 mg/dL (ref 0.57–1.00)
GFR calc Af Amer: 88 mL/min/{1.73_m2} (ref >59)
GFR calc non Af Amer: 76 mL/min/{1.73_m2} (ref >59)
GLUCOSE: 95 mg/dL (ref 65–99)
Globulin, Total: 2.6 g/dL (ref 1.5–4.5)
POTASSIUM: 4.8 mmol/L (ref 3.5–5.2)
Sodium: 138 mmol/L (ref 134–144)
Total Protein: 6.5 g/dL (ref 6.0–8.5)

## 2015-10-20 LAB — ANEMIA PROFILE B
BASOS ABS: 0.1 10*3/uL (ref 0.0–0.2)
Basos: 1 %
EOS (ABSOLUTE): 0.2 10*3/uL (ref 0.0–0.4)
Eos: 2 %
FOLATE: 18.1 ng/mL (ref >3.0)
Ferritin: 46 ng/mL (ref 15–150)
Hematocrit: 41.7 % (ref 34.0–46.6)
Hemoglobin: 13.8 g/dL (ref 11.1–15.9)
Immature Grans (Abs): 0 10*3/uL (ref 0.0–0.1)
Immature Granulocytes: 0 %
Iron Saturation: 30 % (ref 15–55)
Iron: 89 ug/dL (ref 27–139)
LYMPHS ABS: 2.1 10*3/uL (ref 0.7–3.1)
Lymphs: 24 %
MCH: 29.8 pg (ref 26.6–33.0)
MCHC: 33.1 g/dL (ref 31.5–35.7)
MCV: 90 fL (ref 79–97)
MONOS ABS: 0.5 10*3/uL (ref 0.1–0.9)
Monocytes: 6 %
NEUTROS ABS: 5.7 10*3/uL (ref 1.4–7.0)
Neutrophils: 67 %
PLATELETS: 391 10*3/uL — AB (ref 150–379)
RBC: 4.63 x10E6/uL (ref 3.77–5.28)
RDW: 13.3 % (ref 12.3–15.4)
Retic Ct Pct: 1 % (ref 0.6–2.6)
Total Iron Binding Capacity: 297 ug/dL (ref 250–450)
UIBC: 208 ug/dL (ref 118–369)
VITAMIN B 12: 520 pg/mL (ref 211–946)
WBC: 8.6 10*3/uL (ref 3.4–10.8)

## 2015-10-20 LAB — LIPID PANEL
CHOLESTEROL TOTAL: 219 mg/dL — AB (ref 100–199)
Chol/HDL Ratio: 3.1 ratio units (ref 0.0–4.4)
HDL: 71 mg/dL (ref >39)
LDL Calculated: 128 mg/dL — ABNORMAL HIGH (ref 0–99)
Triglycerides: 101 mg/dL (ref 0–149)
VLDL CHOLESTEROL CAL: 20 mg/dL (ref 5–40)

## 2015-10-20 LAB — HEPATITIS C ANTIBODY

## 2015-10-20 LAB — THYROID PANEL WITH TSH
FREE THYROXINE INDEX: 2 (ref 1.2–4.9)
T3 Uptake Ratio: 30 % (ref 24–39)
T4 TOTAL: 6.7 ug/dL (ref 4.5–12.0)
TSH: 3.49 u[IU]/mL (ref 0.450–4.500)

## 2015-10-20 LAB — VITAMIN D 25 HYDROXY (VIT D DEFICIENCY, FRACTURES): VIT D 25 HYDROXY: 14.5 ng/mL — AB (ref 30.0–100.0)

## 2015-10-22 ENCOUNTER — Other Ambulatory Visit: Payer: Self-pay | Admitting: Family

## 2015-10-22 NOTE — Telephone Encounter (Signed)
[facility].

## 2015-10-22 NOTE — Telephone Encounter (Signed)
Last seen 10/19/15  Sabrina [name]  If approved route to nurse to call into [facility]

## 2015-10-23 ENCOUNTER — Encounter: Payer: Self-pay | Admitting: Internal Medicine

## 2015-10-23 DIAGNOSIS — Z1231 Encounter for screening mammogram for malignant neoplasm of breast: Secondary | ICD-10-CM | POA: Diagnosis not present

## 2015-10-23 LAB — HM MAMMOGRAPHY

## 2015-10-24 LAB — PAP IG W/ RFLX HPV ASCU: PAP Smear Comment: 0

## 2015-10-31 ENCOUNTER — Other Ambulatory Visit: Payer: Self-pay | Admitting: *Deleted

## 2015-10-31 MED ORDER — VITAMIN D (ERGOCALCIFEROL) 1.25 MG (50000 UNIT) PO CAPS: 50000.0000 [IU] | ORAL_CAPSULE | ORAL | Status: DC

## 2015-11-01 ENCOUNTER — Encounter: Payer: Self-pay | Admitting: *Deleted

## 2015-11-02 ENCOUNTER — Telehealth: Payer: Self-pay | Admitting: Family

## 2015-11-02 NOTE — Telephone Encounter (Signed)
Aware of results and new medications.

## 2015-11-03 ENCOUNTER — Other Ambulatory Visit: Payer: Self-pay

## 2015-11-03 MED ORDER — SIMVASTATIN 20 MG PO TABS: 20.0000 mg | ORAL_TABLET | Freq: Every day | ORAL | Status: DC

## 2015-11-08 ENCOUNTER — Encounter: Payer: Self-pay | Admitting: *Deleted

## 2015-11-22 ENCOUNTER — Telehealth: Payer: Self-pay | Admitting: *Deleted

## 2015-11-22 ENCOUNTER — Ambulatory Visit (AMBULATORY_SURGERY_CENTER): Payer: Self-pay | Admitting: *Deleted

## 2015-11-22 VITALS — Ht 65.0 in | Wt 183.2 lb

## 2015-11-22 DIAGNOSIS — Z1211 Encounter for screening for malignant neoplasm of colon: Secondary | ICD-10-CM

## 2015-11-22 MED ORDER — NA SULFATE-K SULFATE-MG SULF 17.5-3.13-1.6 GM/177ML PO SOLN: ORAL | Status: DC

## 2015-11-22 NOTE — Progress Notes (Signed)
No allergies to eggs or soy. No problems with anesthesia.  Pt given Emmi instructions for colonoscopy  No oxygen use  No diet drug use  

## 2015-11-22 NOTE — Telephone Encounter (Signed)
Dr Hilarie Fredrickson: pt here today for PV for direct screening colonoscopy.  Last colonoscopy 10+ years ago at [facility].  She does not know who performed procedure.  Pt says that she is having problems with foul smelling stool and is incontinent of stool.  She denies diarrhea.  She also says that occasionally she will have stool leaking from her vagina.  Also has LLQ pain that has been intermittent for 4 years.  Is pt okay for direct colonoscopy or does she need OV?  Thanks, Juliann Pulse in [facility]

## 2015-11-23 NOTE — Telephone Encounter (Signed)
[place] so we can schedule an OV as per TE from Dr Hilarie Fredrickson.  [place]  (Cancelled colon for 3-13 and will schedule OV and then Office can Rs if needed)

## 2015-11-23 NOTE — Telephone Encounter (Signed)
Given symptoms would recommend office visit with me or APP first History of sigmoid diverticulosis by CT in 2014 If stool leaks from vagina my suspicion is for possible fistula, which should be discussed in clinic first prior to colonoscopy

## 2015-11-26 NOTE — Telephone Encounter (Signed)
Pt called and scheduled an office visit with JMP 01-22-2016 at 130 pm.   Sabrina [name] PV

## 2015-11-28 ENCOUNTER — Other Ambulatory Visit: Payer: Self-pay | Admitting: Internal Medicine

## 2015-12-03 ENCOUNTER — Encounter: Payer: Commercial Managed Care - HMO | Admitting: Internal Medicine

## 2016-01-09 ENCOUNTER — Other Ambulatory Visit: Payer: Self-pay | Admitting: Internal Medicine

## 2016-01-11 ENCOUNTER — Other Ambulatory Visit: Payer: Self-pay | Admitting: Family

## 2016-01-11 ENCOUNTER — Other Ambulatory Visit: Payer: Self-pay | Admitting: Internal Medicine

## 2016-01-13 DIAGNOSIS — M797 Fibromyalgia: Secondary | ICD-10-CM | POA: Diagnosis not present

## 2016-01-13 DIAGNOSIS — Z87891 Personal history of nicotine dependence: Secondary | ICD-10-CM | POA: Diagnosis not present

## 2016-01-13 DIAGNOSIS — Z79899 Other long term (current) drug therapy: Secondary | ICD-10-CM | POA: Diagnosis not present

## 2016-01-13 DIAGNOSIS — M7701 Medial epicondylitis, right elbow: Secondary | ICD-10-CM | POA: Diagnosis not present

## 2016-01-15 ENCOUNTER — Other Ambulatory Visit: Payer: Self-pay | Admitting: *Deleted

## 2016-01-15 DIAGNOSIS — M792 Neuralgia and neuritis, unspecified: Secondary | ICD-10-CM

## 2016-01-15 MED ORDER — GABAPENTIN 100 MG PO CAPS: ORAL_CAPSULE | ORAL | Status: DC

## 2016-01-22 ENCOUNTER — Ambulatory Visit: Payer: Commercial Managed Care - HMO | Admitting: Internal Medicine

## 2016-01-22 ENCOUNTER — Encounter: Payer: Self-pay | Admitting: Physician Assistant

## 2016-01-22 ENCOUNTER — Other Ambulatory Visit (INDEPENDENT_AMBULATORY_CARE_PROVIDER_SITE_OTHER): Payer: Commercial Managed Care - HMO

## 2016-01-22 ENCOUNTER — Ambulatory Visit (INDEPENDENT_AMBULATORY_CARE_PROVIDER_SITE_OTHER): Payer: Commercial Managed Care - HMO | Admitting: Physician Assistant

## 2016-01-22 VITALS — BP 108/72 | HR 85 | Ht 64.0 in | Wt 185.0 lb

## 2016-01-22 DIAGNOSIS — Z9049 Acquired absence of other specified parts of digestive tract: Secondary | ICD-10-CM

## 2016-01-22 DIAGNOSIS — Z8719 Personal history of other diseases of the digestive system: Secondary | ICD-10-CM

## 2016-01-22 DIAGNOSIS — Z9884 Bariatric surgery status: Secondary | ICD-10-CM

## 2016-01-22 DIAGNOSIS — R1012 Left upper quadrant pain: Secondary | ICD-10-CM

## 2016-01-22 DIAGNOSIS — Z9081 Acquired absence of spleen: Secondary | ICD-10-CM | POA: Diagnosis not present

## 2016-01-22 LAB — SEDIMENTATION RATE: Sed Rate: 13 mm/hr (ref 0–22)

## 2016-01-22 LAB — CBC WITH DIFFERENTIAL/PLATELET
Basophils Absolute: 0.1 10*3/uL (ref 0.0–0.1)
Basophils Relative: 0.8 % (ref 0.0–3.0)
EOS ABS: 0.2 10*3/uL (ref 0.0–0.7)
Eosinophils Relative: 2.4 % (ref 0.0–5.0)
HCT: 44 % (ref 36.0–46.0)
HEMOGLOBIN: 14.7 g/dL (ref 12.0–15.0)
LYMPHS ABS: 3.2 10*3/uL (ref 0.7–4.0)
Lymphocytes Relative: 33.9 % (ref 12.0–46.0)
MCHC: 33.5 g/dL (ref 30.0–36.0)
MCV: 90 fl (ref 78.0–100.0)
MONO ABS: 0.6 10*3/uL (ref 0.1–1.0)
Monocytes Relative: 6.1 % (ref 3.0–12.0)
NEUTROS PCT: 56.8 % (ref 43.0–77.0)
Neutro Abs: 5.4 10*3/uL (ref 1.4–7.7)
Platelets: 393 10*3/uL (ref 150.0–400.0)
RBC: 4.89 Mil/uL (ref 3.87–5.11)
RDW: 13.6 % (ref 11.5–15.5)
WBC: 9.5 10*3/uL (ref 4.0–10.5)

## 2016-01-22 LAB — BASIC METABOLIC PANEL
BUN: 16 mg/dL (ref 6–23)
CALCIUM: 9.4 mg/dL (ref 8.4–10.5)
CO2: 27 meq/L (ref 19–32)
Chloride: 104 mEq/L (ref 96–112)
Creatinine, Ser: 0.78 mg/dL (ref 0.40–1.20)
GFR: 77.94 mL/min (ref >60.00)
GLUCOSE: 105 mg/dL — AB (ref 70–99)
POTASSIUM: 4.5 meq/L (ref 3.5–5.1)
SODIUM: 139 meq/L (ref 135–145)

## 2016-01-22 LAB — C-REACTIVE PROTEIN: CRP: 0.2 mg/dL — AB (ref 0.5–20.0)

## 2016-01-22 NOTE — Progress Notes (Addendum)
Patient ID: Sabrina [name], female   DOB: 1945/10/02, 70 y.o.   MRN: [id number]   Subjective:    Patient ID: Sabrina [name], female    DOB: [birth year], 70 y.o.   MRN: [id number]  HPI  Sabrina [name] is a pleasant 70 year old white female, new today referred by Dr. Lenna Gilford. She was initially referred for screening colonoscopy however at the time of previsit had several GI complaints and was therefore set up for office visit. She says that her last colonoscopy was done at [facility] proximally 10 years ago and was an incomplete exam. She says that her bowel was " looped". 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 She is also status post cholecystectomy and hysterectomy and says she had a bowel obstruction after her gastric bypass is well and was hospitalized at [facility] for adhesions. She also had an episode of diverticulitis in 2014 but did not require hospitalization. She has history of iron deficiency and B12 deficiency, congestive heart failure with EF of 55-60% and COPD. She says she has been having very foul malodorous stool over the past couple of years and alternating bowel habits between loose and formed stools. In the past 6 months she has had intermittent vaginal drainage which she says has been brown and smells like stool. This is been happening intermittently. She also has had left upper quadrant abdominal pain over the past couple of years. She says she had to live with her symptoms because she had no insurance until recently. Her appetite has been okay she has been maintaining her weight, has no complaints of nausea or vomiting. He had been taking a baby aspirin daily and also meloxicam.  Patient is under a lot of chronic stress, raising her grandson who has Asperger's and ADD.  Review of Systems Pertinent positive and  negative review of systems were noted in the above HPI section.  All other review of systems was otherwise negative.  Outpatient Encounter Prescriptions as of 01/22/2016  Medication Sig  . albuterol (PROVENTIL HFA;VENTOLIN HFA) 108 (90 BASE) MCG/ACT inhaler Inhale 2 puffs into the lungs every 4 (four) hours as needed for wheezing or shortness of breath. Reported on 11/22/2015  . albuterol (PROVENTIL) (2.5 MG/3ML) 0.083% nebulizer solution Take 3 mLs (2.5 mg total) by nebulization every 6 (six) hours as needed for wheezing or shortness of breath.  . ALPRAZolam (XANAX) 0.5 MG tablet TAKE 1 TABLET BY MOUTH AT BEDTIME AS NEEDED FOR ANXIETY  . aspirin 81 MG tablet Take 81 mg by mouth daily.  . budesonide-formoterol (SYMBICORT) 160-4.5 MCG/ACT inhaler Inhale 2 puffs into the lungs 2 (two) times daily.  . [facility] IRON 325 (65 FE) MG tablet TAKE 1 TABLET (325 MG TOTAL) BY MOUTH DAILY WITH BREAKFAST.  . DULoxetine (CYMBALTA) 60 MG capsule Take 1 capsule (60 mg total) by mouth daily.  . fluticasone (FLONASE) 50 MCG/ACT nasal spray Place 2 sprays into both nostrils daily.  . furosemide (LASIX) 20 MG tablet TAKE 1 TABLET (20 MG TOTAL) BY MOUTH 2 (TWO) TIMES DAILY.  . furosemide (LASIX) 20 MG tablet TAKE 1 TABLET (20 MG TOTAL) BY MOUTH 2 (TWO) TIMES DAILY.  Marland Kitchen gabapentin (NEURONTIN) 100 MG capsule Take 1 Tab for 1 day, then 1 tab twice a day, then 1 tab TID  . halobetasol (ULTRAVATE) 0.05 % cream APPLY  TOPICALLY 2 (TWO) TIMES DAILY.  Marland Kitchen loratadine (CLARITIN) 10 MG tablet Take 1 tablet (10 mg total) by mouth daily.  . meloxicam (MOBIC) 15 MG tablet TAKE 1 TABLET (15 MG TOTAL) BY MOUTH DAILY.  . Na Sulfate-K Sulfate-Mg Sulf (SUPREP BOWEL PREP) SOLN suprep as directed.  No substitutions  . simvastatin (ZOCOR) 20 MG tablet Take 1 tablet (20 mg total) by mouth at bedtime.  . traZODone (DESYREL) 100 MG tablet TAKE 1 TABLET AT BEDTIME  . Vitamin D, Ergocalciferol, (DRISDOL) 50000 units CAPS capsule TAKE 1 CAPSULE (50,000  UNITS TOTAL) BY MOUTH EVERY 7 (SEVEN) DAYS.   No facility-administered encounter medications on file as of 01/22/2016.   Allergies  Allergen Reactions  . Codeine Nausea And Vomiting   Patient Active Problem List   Diagnosis Date Noted  . S/P gastric bypass 01/22/2016  . S/P splenectomy 01/22/2016  . S/P cholecystectomy 01/22/2016  . Hx of small bowel obstruction 01/22/2016  . Asthma 10/19/2015  . Generalized anxiety disorder 10/19/2015  . Depression 10/19/2015  . Insomnia 10/19/2015  . Iron deficiency anemia 10/19/2015  . GAD (generalized anxiety disorder) 02/01/2015  . Vitamin D deficiency 11/29/2014  . Eczema 11/27/2014  . Benign essential HTN 09/25/2014  . Morbid obesity ([place]) 09/25/2014  . Chronic diastolic heart failure ([place]) 09/25/2014  . Chest tightness or pressure 03/27/2014  . SOB (shortness of breath) 03/27/2014  . Tick bites 02/28/2013  . Allergic rhinitis 02/28/2013   Social History   Social History  . Marital Status: Divorced    Spouse Name: N/A  . Number of Children: N/A  . Years of Education: N/A   Occupational History  . Not on file.   Social History Main Topics  . Smoking status: Former Smoker    Quit date: 02/26/1993  . Smokeless tobacco: Never Used  . Alcohol Use: No  . Drug Use: No  . Sexual Activity: Not on file   Other Topics Concern  . Not on file   Social History Narrative    [name]'s family history includes Alzheimer's disease in her mother; Arthritis in her father; Cancer in her mother; Colon cancer (age of onset: 45) in her paternal uncle.      Objective:    Filed Vitals:   01/22/16 1345  BP: 108/72  Pulse: 85    Physical Exam  well-developed older white female in no acute distress, pleasant blood pressure 108/72 pulse 85 height 5 foot 4 weight 185. HEENT ;nontraumatic normocephalic EOMI PERRLA sclera anicteric, Cardiovascular ;regular rate and rhythm with S1-S2 no murmur or gallop, Pulmonary; clear bilaterally, Abdomen;  large soft, no palpable mass or hepatosplenomegaly she's tender in the left upper and left mid quadrant no guarding or rebound bowel sounds present, Rectal ;exam scant stool heme negative no fistula or anal abnormality noted, Ext; no clubbing cyanosis or edema skin warm and dry, Neuropsych; mood and affect appropriate     Assessment & Plan:   #75 70 year old white female with complicated past medical history with complaints of left upper quadrant abdominal pain 2 years, alternating bowel habits and intermittent vaginal drainage/possible stool concerning for colovaginal fistula #2 status post remote gastric bypass complicated by splenic injury- splenectomy and then bowel obstruction #3 status post cholecystectomy   #4 status post hysterectomy #5 history of diverticulitis 2014 by patient report #6 history of iron and B12 deficiency #7 CHF #8 COPD  Plan; check CBC with differential, BMET, sedimentation rate,CRP Schedule for CT scan of the abdomen and pelvis with contrast  I expect she will need colonoscopy and EGD with Dr.Pyrtle but will review CT first especially in light of possible colovaginal  Fistula Stop Mobic Add daily probiotic   Amy S Esterwood PA-C 01/22/2016   Cc: Sharion Balloon, FNP  Addendum: Reviewed and agree with initial management. Jerene Bears, MD

## 2016-01-22 NOTE — Patient Instructions (Signed)
Please go to the basement level to have your labs drawn.  Stop Meloxicam.  Start a probiotic, culturelle or Align.    You have been scheduled for a CT scan of the abdomen and pelvis at [facility] ([address]---this is in the same building as Press [facility]).   You are scheduled on Thursday 01-24-2016 at 10:00 am. You should arrive at 9:45 am to your appointment time for registration. Please follow the written instructions below on the day of your exam:  WARNING: IF YOU ARE ALLERGIC TO IODINE/X-RAY DYE, PLEASE NOTIFY RADIOLOGY IMMEDIATELY AT [phone]! YOU WILL BE GIVEN A 13 HOUR PREMEDICATION PREP.  1) Do not eat or drink anything after 6:00 am  (4 hours prior to your test) 2) You have been given 2 bottles of oral contrast to drink. The solution may taste better if refrigerated, but do NOT add ice or any other liquid to this solution. Shake  well before drinking.    Drink 1 bottle of contrast @ 8:00 am  (2 hours prior to your exam)  Drink 1 bottle of contrast @ 9:00 am (1 hour prior to your exam)  You may take any medications as prescribed with a small amount of water except for the following: Metformin, Glucophage, Glucovance, Avandamet, Riomet, Fortamet, Actoplus Met, Janumet, Glumetza or Metaglip. The above medications must be held the day of the exam AND 48 hours after the exam.  The purpose of you drinking the oral contrast is to aid in the visualization of your intestinal tract. The contrast solution may cause some diarrhea. Before your exam is started, you will be given a small amount of fluid to drink. Depending on your individual set of symptoms, you may also receive an intravenous injection of x-ray contrast/dye. Plan on being at [facility] for 30 minutes or long, depending on the type of exam you are having performed.  If you have any questions regarding your exam or if you need to reschedule, you may call the CT department at [phone] between the  hours of 8:00 am and 5:00 pm, Monday-Friday.  ________________________________________________________________________

## 2016-01-24 ENCOUNTER — Ambulatory Visit (INDEPENDENT_AMBULATORY_CARE_PROVIDER_SITE_OTHER)
Admission: RE | Admit: 2016-01-24 | Discharge: 2016-01-24 | Disposition: A | Discharge status: Home / Self Care | Payer: 59 | Source: Ambulatory Visit | Attending: Physician Assistant | Admitting: Physician Assistant

## 2016-01-24 DIAGNOSIS — R1012 Left upper quadrant pain: Secondary | ICD-10-CM | POA: Diagnosis not present

## 2016-01-24 DIAGNOSIS — Z9884 Bariatric surgery status: Secondary | ICD-10-CM | POA: Diagnosis not present

## 2016-01-24 MED ORDER — IOPAMIDOL (ISOVUE-300) INJECTION 61%
100.0000 mL | Freq: Once | INTRAVENOUS | Status: AC | PRN
  Administered 2016-01-24: 100 mL via INTRAVENOUS

## 2016-01-29 ENCOUNTER — Other Ambulatory Visit: Payer: Self-pay | Admitting: Family

## 2016-02-04 ENCOUNTER — Ambulatory Visit (AMBULATORY_SURGERY_CENTER): Payer: Self-pay

## 2016-02-04 DIAGNOSIS — R1012 Left upper quadrant pain: Secondary | ICD-10-CM

## 2016-02-04 MED ORDER — SUPREP BOWEL PREP KIT 17.5-3.13-1.6 GM/177ML PO SOLN: 1.0000 | Freq: Once | ORAL | Status: DC

## 2016-02-04 NOTE — Progress Notes (Signed)
No allergies to eggs or soy No past problems with anesthesia No diet meds No home oxygen  Has email and internet; declined emmi 

## 2016-02-13 ENCOUNTER — Ambulatory Visit (AMBULATORY_SURGERY_CENTER): Payer: Commercial Managed Care - HMO | Admitting: Internal Medicine

## 2016-02-13 ENCOUNTER — Encounter: Payer: Self-pay | Admitting: Internal Medicine

## 2016-02-13 VITALS — BP 131/87 | HR 78 | Temp 98.0°F | Resp 11 | Ht 64.0 in | Wt 185.0 lb

## 2016-02-13 DIAGNOSIS — R1012 Left upper quadrant pain: Secondary | ICD-10-CM | POA: Diagnosis not present

## 2016-02-13 DIAGNOSIS — D122 Benign neoplasm of ascending colon: Secondary | ICD-10-CM

## 2016-02-13 DIAGNOSIS — I509 Heart failure, unspecified: Secondary | ICD-10-CM | POA: Diagnosis not present

## 2016-02-13 DIAGNOSIS — Z1211 Encounter for screening for malignant neoplasm of colon: Secondary | ICD-10-CM | POA: Diagnosis not present

## 2016-02-13 MED ORDER — OMEPRAZOLE 40 MG PO CPDR: 40.0000 mg | DELAYED_RELEASE_CAPSULE | Freq: Every day | ORAL | Status: DC

## 2016-02-13 MED ORDER — SODIUM CHLORIDE 0.9 % IV SOLN: 500.0000 mL | INTRAVENOUS | Status: DC

## 2016-02-13 NOTE — Op Note (Signed)
[facility] Patient Name: Sabrina [name] Procedure Date: 02/13/2016 2:04 PM MRN: [id number] Endoscopist: Jerene Bears , MD Age: 70 Referring MD:  Date of Birth: March 28, 1946 Gender: Female Procedure:                Upper GI endoscopy Indications:              Abdominal pain in the left upper quadrant, Status                            post Roux-en-Y Medicines:                Monitored Anesthesia Care Procedure:                Pre-Anesthesia Assessment:                           - Prior to the procedure, a History and Physical                            was performed, and patient medications and                            allergies were reviewed. The patient's tolerance of                            previous anesthesia was also reviewed. The risks                            and benefits of the procedure and the sedation                            options and risks were discussed with the patient.                            All questions were answered, and informed consent                            was obtained. Prior Anticoagulants: The patient has                            taken no previous anticoagulant or antiplatelet                            agents. ASA Grade Assessment: III - A patient with                            severe systemic disease. After reviewing the risks                            and benefits, the patient was deemed in                            satisfactory condition to undergo the procedure.  After obtaining informed consent, the endoscope was                            passed under direct vision. Throughout the                            procedure, the patient's blood pressure, pulse, and                            oxygen saturations were monitored continuously. The                            Model GIF-HQ190 [id number]) scope was introduced                            through the mouth, and advanced to the efferent                   jejunal loop. The upper GI endoscopy was                            accomplished without difficulty. The patient                            tolerated the procedure well. Scope In: Scope Out: Findings:                 The examined esophagus was normal. Z-line regular                            at 35 cm.                           Evidence of a Roux-en-Y gastrojejunostomy was                            found. Normal appearing gastric pouch, 5 cm, in                            length. The gastrojejunal anastomosis was                            characterized by healthy appearing mucosa and no                            evidence of stomal ulceration. This was traversed.                            The pouch-to-jejunum limb was characterized by                            healthy appearing mucosa. The jejunojejunal                            anastomosis was characterized by healthy appearing  mucosa. The duodenum-to-jejunum limb was not                            examined as it could not be reached. Complications:            No immediate complications. Estimated Blood Loss:     Estimated blood loss: none. Impression:               - Normal esophagus.                           - Roux-en-Y gastrojejunostomy with gastrojejunal                            anastomosis characterized by healthy appearing                            mucosa and no stomal ulceration.                           - No specimens collected. Recommendation:           - Patient has a contact number available for                            emergencies. The signs and symptoms of potential                            delayed complications were discussed with the                            patient. Return to normal activities tomorrow.                            Written discharge instructions were provided to the                            patient.                           - Resume previous  diet.                           - Continue present medications.                           - Perform a colonoscopy today. Jerene Bears, MD 02/13/2016 3:01:41 PM This report has been signed electronically.

## 2016-02-13 NOTE — Patient Instructions (Addendum)
YOU HAD AN ENDOSCOPIC PROCEDURE TODAY AT THE [facility]:   Refer to the procedure report that was given to you for any specific questions about what was found during the examination.  If the procedure report does not answer your questions, please call your gastroenterologist to clarify.  If you requested that your care partner not be given the details of your procedure findings, then the procedure report has been included in a sealed envelope for you to review at your convenience later.  YOU SHOULD EXPECT: Some feelings of bloating in the abdomen. Passage of more gas than usual.  Walking can help get rid of the air that was put into your GI tract during the procedure and reduce the bloating. If you had a lower endoscopy (such as a colonoscopy or flexible sigmoidoscopy) you may notice spotting of blood in your stool or on the toilet paper. If you underwent a bowel prep for your procedure, you may not have a normal bowel movement for a few days.  Please Note:  You might notice some irritation and congestion in your nose or some drainage.  This is from the oxygen used during your procedure.  There is no need for concern and it should clear up in a day or so.  SYMPTOMS TO REPORT IMMEDIATELY:   Following lower endoscopy (colonoscopy or flexible sigmoidoscopy):  Excessive amounts of blood in the stool  Significant tenderness or worsening of abdominal pains  Swelling of the abdomen that is new, acute  Fever of 100F or higher   Following upper endoscopy (EGD)  Vomiting of blood or coffee ground material  New chest pain or pain under the shoulder blades  Painful or persistently difficult swallowing  New shortness of breath  Fever of 100F or higher  Black, tarry-looking stools  For urgent or emergent issues, a gastroenterologist can be reached at any hour by calling [phone].   DIET: Your first meal following the procedure should be a small meal and then it is ok to progress to  your normal diet. Heavy or fried foods are harder to digest and may make you feel nauseous or bloated.  Likewise, meals heavy in dairy and vegetables can increase bloating.  Drink plenty of fluids but you should avoid alcoholic beverages for 24 hours.  ACTIVITY:  You should plan to take it easy for the rest of today and you should NOT DRIVE or use heavy machinery until tomorrow (because of the sedation medicines used during the test).    FOLLOW UP: Our staff will call the number listed on your records the next business day following your procedure to check on you and address any questions or concerns that you may have regarding the information given to you following your procedure. If we do not reach you, we will leave a message.  However, if you are feeling well and you are not experiencing any problems, there is no need to return our call.  We will assume that you have returned to your regular daily activities without incident.  If any biopsies were taken you will be contacted by phone or by letter within the next 1-3 weeks.  Please call us at [phone] if you have not heard about the biopsies in 3 weeks.    SIGNATURES/CONFIDENTIALITY: You and/or your care partner have signed paperwork which will be entered into your electronic medical record.  These signatures attest to the fact that that the information above on your After Visit Summary has been reviewed   and is understood.  Full responsibility of the confidentiality of this discharge information lies with you and/or your care-partner.    Handouts were given to your care partner on polyps, diverticulosis, and a high fiber diet with liberal fluid intake. You may resume your current medications today.  Added omeprazole 40 mg every am on an empty stomach, 20- 30 minutes prior to breakfast.  Open capsule and sprinkle over pudding or apple sauce. Await biopsy results. If vaginal discharge continues, barium enema is recommended. Office follow up  with Dr. Hilarie Fredrickson next available appointment. Please call if any questions or concerns.

## 2016-02-13 NOTE — Progress Notes (Signed)
No problems noted in the recovery room. maw 

## 2016-02-13 NOTE — Op Note (Addendum)
[place] Patient Name: Sabrina [name] Procedure Date: 02/13/2016 2:29 PM MRN: [id number] Endoscopist: Jerene Bears , MD Age: 70 Referring MD:  Date of Birth: 18-Feb-1946 Gender: Female Procedure:                Colonoscopy Indications:              Screening for colorectal malignant neoplasm, also                            evaluation of LUQ pain Medicines:                Monitored Anesthesia Care Procedure:                Pre-Anesthesia Assessment:                           - Prior to the procedure, a History and Physical                            was performed, and patient medications and                            allergies were reviewed. The patient's tolerance of                            previous anesthesia was also reviewed. The risks                            and benefits of the procedure and the sedation                            options and risks were discussed with the patient.                            All questions were answered, and informed consent                            was obtained. Prior Anticoagulants: The patient has                            taken no previous anticoagulant or antiplatelet                            agents. ASA Grade Assessment: III - A patient with                            severe systemic disease. After reviewing the risks                            and benefits, the patient was deemed in                            satisfactory condition to undergo the procedure.  After obtaining informed consent, the colonoscope                            was passed under direct vision. Throughout the                            procedure, the patient's blood pressure, pulse, and                            oxygen saturations were monitored continuously. The                            Model PCF-H190L [id number]) scope was introduced                            through the anus and advanced to the the cecum,                      identified by appendiceal orifice and ileocecal                            valve. The colonoscopy was performed without                            difficulty. The patient tolerated the procedure                            well. The quality of the bowel preparation was                            good. The ileocecal valve, appendiceal orifice, and                            rectum were photographed. Scope In: 2:31:34 PM Scope Out: 2:51:37 PM Scope Withdrawal Time: 0 hours 13 minutes 8 seconds  Total Procedure Duration: 0 hours 20 minutes 3 seconds  Findings:                 A 5 mm polyp was found in the ascending colon. The                            polyp was sessile. The polyp was removed with a                            cold snare. Resection and retrieval were complete.                           Multiple small and large-mouthed diverticula were                            found in the ascending colon.                           Many small and large-mouthed diverticula were found  in the sigmoid colon, descending colon and splenic                            flexure.                           The retroflexed view of the distal rectum and anal                            verge was normal and showed no anal or rectal                            abnormalities. Complications:            No immediate complications. Estimated Blood Loss:     Estimated blood loss was minimal. Impression:               - One 5 mm polyp in the ascending colon, removed                            with a cold snare. Resected and retrieved.                           - Mild diverticulosis in the ascending colon.                           - Severe diverticulosis in the sigmoid colon, in                            the descending colon and at the splenic flexure.                           - The distal rectum and anal verge are normal on                            retroflexion  view.                           - No evidence of fistula formation in the left                            colon, but given the degree of diverticulosis,                            fistula cannot be excluded. Recommendation:           - Patient has a contact number available for                            emergencies. The signs and symptoms of potential                            delayed complications were discussed with the  patient. Return to normal activities tomorrow.                            Written discharge instructions were provided to the                            patient.                           - Resume previous diet.                           - Continue present medications.                           - Await pathology results.                           - Repeat colonoscopy is recommended. The                            colonoscopy date will be determined after pathology                            results from today's exam become available for                            review.                           - If vaginal discharge continues, barium enema is                            recommended.                           - Office follow-up with me next available. Jerene Bears, MD 02/13/2016 3:06:10 PM This report has been signed electronically. Addendum Number: 1   Addendum Date: 02/13/2016 3:07:19 PM      Trial of omeprazole 40 mg daily (open capsule to ensure absorption given       history of gastric bypass) Jerene Bears, MD 02/13/2016 3:07:46 PM This report has been signed electronically.

## 2016-02-13 NOTE — Progress Notes (Signed)
Pt is HIP[place].  I sent her friend down to get the car and helped pt with dressing. maw

## 2016-02-13 NOTE — Progress Notes (Signed)
A/ox3, pleased with MAC, report to RN 

## 2016-02-13 NOTE — Progress Notes (Signed)
Called to room to assist during endoscopic procedure.  Patient ID and intended procedure confirmed with present staff. Received instructions for my participation in the procedure from the performing physician.  

## 2016-02-14 ENCOUNTER — Telehealth: Payer: Self-pay | Admitting: *Deleted

## 2016-02-14 NOTE — Telephone Encounter (Signed)
  Follow up Call-  Call back number 02/13/2016  Post procedure Call Back phone  # [phone]  Permission to leave phone message Yes     Patient questions:  Do you have a fever, pain , or abdominal swelling? No. Pain Score  0 *  Have you tolerated food without any problems? Yes.    Have you been able to return to your normal activities? Yes.    Do you have any questions about your discharge instructions: Diet   No. Medications  No. Follow up visit  No.  Do you have questions or concerns about your Care? No.  Actions: * If pain score is 4 or above: No action needed, pain <4.

## 2016-02-19 ENCOUNTER — Encounter: Payer: Self-pay | Admitting: Internal Medicine

## 2016-02-22 ENCOUNTER — Telehealth: Payer: Self-pay | Admitting: Internal Medicine

## 2016-02-22 NOTE — Telephone Encounter (Signed)
Pt calling for path results. Path letter discussed with pt.

## 2016-03-20 ENCOUNTER — Encounter: Payer: Self-pay | Admitting: *Deleted

## 2016-04-08 DIAGNOSIS — E119 Type 2 diabetes mellitus without complications: Secondary | ICD-10-CM | POA: Diagnosis not present

## 2016-04-08 DIAGNOSIS — H5200 Hypermetropia, unspecified eye: Secondary | ICD-10-CM | POA: Diagnosis not present

## 2016-04-08 DIAGNOSIS — H5210 Myopia, unspecified eye: Secondary | ICD-10-CM | POA: Diagnosis not present

## 2016-04-21 ENCOUNTER — Other Ambulatory Visit (INDEPENDENT_AMBULATORY_CARE_PROVIDER_SITE_OTHER): Payer: Commercial Managed Care - HMO

## 2016-04-21 ENCOUNTER — Ambulatory Visit (INDEPENDENT_AMBULATORY_CARE_PROVIDER_SITE_OTHER): Payer: Commercial Managed Care - HMO | Admitting: Internal Medicine

## 2016-04-21 ENCOUNTER — Encounter: Payer: Self-pay | Admitting: Internal Medicine

## 2016-04-21 ENCOUNTER — Telehealth: Payer: Self-pay | Admitting: Family

## 2016-04-21 VITALS — BP 120/80 | HR 108 | Ht 64.0 in | Wt 185.2 lb

## 2016-04-21 DIAGNOSIS — K5730 Diverticulosis of large intestine without perforation or abscess without bleeding: Secondary | ICD-10-CM

## 2016-04-21 DIAGNOSIS — N390 Urinary tract infection, site not specified: Secondary | ICD-10-CM

## 2016-04-21 DIAGNOSIS — R1030 Lower abdominal pain, unspecified: Secondary | ICD-10-CM | POA: Diagnosis not present

## 2016-04-21 LAB — CBC WITH DIFFERENTIAL/PLATELET
BASOS PCT: 1 % (ref 0.0–3.0)
Basophils Absolute: 0.1 10*3/uL (ref 0.0–0.1)
EOS ABS: 0.1 10*3/uL (ref 0.0–0.7)
Eosinophils Relative: 1.2 % (ref 0.0–5.0)
HCT: 44.3 % (ref 36.0–46.0)
Hemoglobin: 14.8 g/dL (ref 12.0–15.0)
Lymphocytes Relative: 23.4 % (ref 12.0–46.0)
Lymphs Abs: 2.3 10*3/uL (ref 0.7–4.0)
MCHC: 33.5 g/dL (ref 30.0–36.0)
MCV: 90.6 fl (ref 78.0–100.0)
MONO ABS: 0.6 10*3/uL (ref 0.1–1.0)
Monocytes Relative: 5.6 % (ref 3.0–12.0)
NEUTROS ABS: 6.8 10*3/uL (ref 1.4–7.7)
Neutrophils Relative %: 68.8 % (ref 43.0–77.0)
PLATELETS: 408 10*3/uL — AB (ref 150.0–400.0)
RBC: 4.89 Mil/uL (ref 3.87–5.11)
RDW: 13.4 % (ref 11.5–15.5)
WBC: 9.9 10*3/uL (ref 4.0–10.5)

## 2016-04-21 LAB — COMPREHENSIVE METABOLIC PANEL
ALT: 39 U/L — ABNORMAL HIGH (ref 0–35)
AST: 35 U/L (ref 0–37)
Albumin: 4 g/dL (ref 3.5–5.2)
Alkaline Phosphatase: 115 U/L (ref 39–117)
BUN: 22 mg/dL (ref 6–23)
CHLORIDE: 101 meq/L (ref 96–112)
CO2: 27 meq/L (ref 19–32)
CREATININE: 0.8 mg/dL (ref 0.40–1.20)
Calcium: 9.7 mg/dL (ref 8.4–10.5)
GFR: 75.65 mL/min (ref >60.00)
GLUCOSE: 99 mg/dL (ref 70–99)
Potassium: 5 mEq/L (ref 3.5–5.1)
SODIUM: 136 meq/L (ref 135–145)
Total Bilirubin: 0.6 mg/dL (ref 0.2–1.2)
Total Protein: 7.5 g/dL (ref 6.0–8.3)

## 2016-04-21 LAB — FERRITIN: Ferritin: 22.6 ng/mL (ref 10.0–291.0)

## 2016-04-21 LAB — URINALYSIS, ROUTINE W REFLEX MICROSCOPIC
BILIRUBIN URINE: NEGATIVE
HGB URINE DIPSTICK: NEGATIVE
Ketones, ur: NEGATIVE
NITRITE: NEGATIVE
Specific Gravity, Urine: 1.02 (ref 1.000–1.030)
Total Protein, Urine: NEGATIVE
URINE GLUCOSE: NEGATIVE
UROBILINOGEN UA: 0.2 (ref 0.0–1.0)
pH: 5.5 (ref 5.0–8.0)

## 2016-04-21 LAB — IBC PANEL
IRON: 98 ug/dL (ref 42–145)
Saturation Ratios: 25 % (ref 20.0–50.0)
TRANSFERRIN: 280 mg/dL (ref 212.0–360.0)

## 2016-04-21 MED ORDER — CIPROFLOXACIN HCL 500 MG PO TABS: 500.0000 mg | ORAL_TABLET | Freq: Two times a day (BID) | ORAL | 0 refills | Status: DC

## 2016-04-21 NOTE — Telephone Encounter (Signed)
Spoke with office and was told to disregard message that the authorization was already done.

## 2016-04-21 NOTE — Patient Instructions (Signed)
Your physician has requested that you go to the basement for the following lab work before leaving today: U/a, urine culture, CMP, CBC, IBC, Ferritin  We have sent the following medications to your pharmacy for you to pick up at your convenience: Cipro 500 mg twice daily x 7 days  You have been scheduled for an appointment with Dr [name] at [facility]. Your appointment is on 05/13/16 at 10:30 am. Please arrive at 10:00 am for registration. Make certain to bring a list of current medications, including any over the counter medications or vitamins. Also bring your co-pay if you have one as well as your insurance cards. [facility] is located at [address], [address]. Should you need to reschedule your appointment, please contact them at [phone].  If you are age 62 or older, your body mass index should be between 23-30. Your Body mass index is 31.79 kg/m. If this is out of the aforementioned range listed, please consider follow up with your Primary Care Provider.  If you are age 32 or younger, your body mass index should be between 19-25. Your Body mass index is 31.79 kg/m. If this is out of the aformentioned range listed, please consider follow up with your Primary Care Provider.

## 2016-04-21 NOTE — Progress Notes (Signed)
Subjective:    Patient ID: [name], female    DOB: October 02, 1945, 70 y.o.   MRN: [id number]  HPI Sabrina [name] is a 70 year old female with a past medical history of severe left-sided diverticulosis, adenomatous colon polyps, recurrent UTI and suspicion of colovesicular fistula, history of Roux-en-Y gastric bypass who is here for follow-up. She was initially seen by Sabrina Ba, PA-C on 01/22/2016. At this visit she was complaining of left upper quadrant abdominal pain, alternating bowel habits and intermittent vaginal drainage with recurrent UTI. CT scan of the abdomen and pelvis was performed which showed chronic changes without acute abnormality. There is diffuse diverticular change in the colon without diverticulitis. The bladder was partially distended. Uterus surgically removed. She then came for upper endoscopy and colonoscopy which were performed on 02/13/2016. Upper endoscopy revealed typical Roux-en-Y anatomy but was otherwise unremarkable. Colonoscopy revealed multiple diverticula in the ascending colon and many diverticuli in the descending and sigmoid colon. There was a 5 mm tubular adenoma removed from the ascending colon.  Today she reports that about 3 days ago she developed vaginal discharge and bits of stool in her urine. This is now associated with dysuria and frequency. She reports urinary frequency and hesitancy but passing small amounts of urine. She also reports this is foul-smelling. This is been occurring 6-7 times per year. She is not currently on antibiotics but has been treated with antibiotics multiple times for UTI. She continues to report left upper and bilateral lower abdominal pain. Bowel movements vary from loose to hard small "balls". She is not currently using a laxative or stool softener. She denies seeing blood in her stool or melena. Appetite has been decreased of late but she denies weight loss.  Review of Systems As per history of present illness,  otherwise negative  Current Medications, Allergies, Past Medical History, Past Surgical History, Family History and Social History were reviewed in [employer] record.     Objective:   Physical Exam BP 120/80 (BP Location: Left Arm, Patient Position: Sitting)   Pulse (!) 108   Ht 5\' 4"  (1.626 m)   Wt 185 lb 3.2 oz (84 kg)   SpO2 98%   BMI 31.79 kg/m  Constitutional: Well-developed and well-nourished. No distress. HEENT: Normocephalic and atraumatic. Oropharynx is clear and moist. No oropharyngeal exudate. Conjunctivae are normal.  No scleral icterus. Neck: Neck supple. Trachea midline. Cardiovascular: Normal rate, regular rhythm and intact distal pulses. No M/R/G Pulmonary/chest: Effort normal and breath sounds normal. No wheezing, rales or rhonchi. Abdominal: Soft, left upper abd and lower abd tenderness without rebound or guarding, obese, nondistended. Bowel sounds active throughout. Extremities: no clubbing, cyanosis, or edema Neurological: Alert and oriented to person place and time. Skin: Skin is warm and dry. No rashes noted. Psychiatric: Normal mood and affect. Behavior is normal.   CT ABDOMEN AND PELVIS WITH CONTRAST   TECHNIQUE: Multidetector CT imaging of the abdomen and pelvis was performed using the standard protocol following bolus administration of intravenous contrast.   CONTRAST:  161mL ISOVUE-300 IOPAMIDOL (ISOVUE-300) INJECTION 61%   COMPARISON:  11/15/2012   FINDINGS: Lung bases are free of acute infiltrate or sizable effusion. Small hiatal hernia is noted. Changes of prior gastric bypass are seen.   The gallbladder is been surgically removed. The liver, spleen, adrenal glands and pancreas are within normal limits. Kidneys demonstrate normal enhancement bilaterally. No renal calculi are seen. Stable malrotation of the right kidney is noted.   The appendix has been  surgically removed. Diffuse diverticular change of the colon is  noted without diverticulitis. The bladder is partially distended. The uterus has been surgically removed as well. No pelvic mass lesion is noted. Postsurgical changes in the proximal left femur are noted.   IMPRESSION: Chronic changes without acute abnormality.     Electronically Signed   By: Sabrina [name] M.D.   On: 01/24/2016 13:13  CBC    Component Value Date/Time   WBC 9.5 01/22/2016 1453   RBC 4.89 01/22/2016 1453   HGB 14.7 01/22/2016 1453   HCT 44.0 01/22/2016 1453   HCT 41.7 10/19/2015 1105   PLT 393.0 01/22/2016 1453   PLT 391 (H) 10/19/2015 1105   MCV 90.0 01/22/2016 1453   MCV 90 10/19/2015 1105   MCH 29.8 10/19/2015 1105   MCH 25.8 (A) 02/25/2013 1620   MCHC 33.5 01/22/2016 1453   RDW 13.6 01/22/2016 1453   RDW 13.3 10/19/2015 1105   LYMPHSABS 3.2 01/22/2016 1453   LYMPHSABS 2.1 10/19/2015 1105   MONOABS 0.6 01/22/2016 1453   EOSABS 0.2 01/22/2016 1453   EOSABS 0.2 10/19/2015 1105   BASOSABS 0.1 01/22/2016 1453   BASOSABS 0.1 10/19/2015 1105   CMP     Component Value Date/Time   NA 139 01/22/2016 1453   NA 138 10/19/2015 1105   K 4.5 01/22/2016 1453   CL 104 01/22/2016 1453   CO2 27 01/22/2016 1453   GLUCOSE 105 (H) 01/22/2016 1453   BUN 16 01/22/2016 1453   BUN 19 10/19/2015 1105   CREATININE 0.78 01/22/2016 1453   CREATININE 0.76 10/03/2014 1146   CALCIUM 9.4 01/22/2016 1453   PROT 6.5 10/19/2015 1105   ALBUMIN 3.9 10/19/2015 1105   AST 25 10/19/2015 1105   ALT 18 10/19/2015 1105   ALKPHOS 117 10/19/2015 1105   BILITOT 0.4 10/19/2015 1105   GFRNONAA 76 10/19/2015 1105   GFRNONAA 81 10/03/2014 1146   GFRAA 88 10/19/2015 1105   GFRAA >89 10/03/2014 1146       Assessment & Plan:   70 year old female with a past medical history of severe left-sided diverticulosis, adenomatous colon polyps, recurrent UTI and suspicion of colovesicular fistula, history of Roux-en-Y gastric bypass who is here for follow-up.  1. Severe diverticulosis/recurrent  UTI -- her symptom complex specifically recurrent UTI with visible stool and urine and vaginal discharge are highly suspicious for colovesicular versus colovaginal fistula. This was not definitively evident on colonoscopy nor recent abdominal and pelvic CT. Given this symptom complex and going to refer her to Dr. [name] to discuss possible left hemicolectomy. Dr. Marcello Moores may want a barium or Gastrografin enema, and I will reach out to her so that this test can be ordered prior to consult if desired. In the interim I'm going to check a urinalysis with culture. Empiric begin Cipro 500 mg twice a day 7 days. Check CBC, CMP and iron studies. Begin MiraLAX 17 g daily to help soften stool and hopefully improve abdominal discomfort. I'm suspicious that her left upper quadrant and left-sided abdominal pain are related to constipation and incomplete evacuation given severe diverticulosis.  25 minutes spent with the patient today. Greater than 50% was spent in counseling and coordination of care with the patient

## 2016-04-23 ENCOUNTER — Other Ambulatory Visit: Payer: Self-pay

## 2016-04-23 LAB — URINE CULTURE: Colony Count: >=100000

## 2016-04-23 MED ORDER — NITROFURANTOIN MONOHYD MACRO 100 MG PO CAPS: 100.0000 mg | ORAL_CAPSULE | Freq: Two times a day (BID) | ORAL | 0 refills | Status: DC

## 2016-04-25 DIAGNOSIS — Z0100 Encounter for examination of eyes and vision without abnormal findings: Secondary | ICD-10-CM | POA: Diagnosis not present

## 2016-05-13 DIAGNOSIS — N390 Urinary tract infection, site not specified: Secondary | ICD-10-CM | POA: Diagnosis not present

## 2016-05-20 ENCOUNTER — Other Ambulatory Visit: Payer: Self-pay | Admitting: Family

## 2016-05-20 DIAGNOSIS — F411 Generalized anxiety disorder: Secondary | ICD-10-CM

## 2016-05-20 NOTE — Telephone Encounter (Signed)
Spoke with patient to schedule for follow up appointment with Evelina Dun, FNP, she states she will call back to schedule once she knows the date and time of an appointment she is being scheduled for in [place].

## 2016-05-21 ENCOUNTER — Other Ambulatory Visit: Payer: Self-pay | Admitting: General Surgery

## 2016-05-21 DIAGNOSIS — N390 Urinary tract infection, site not specified: Secondary | ICD-10-CM

## 2016-05-28 ENCOUNTER — Other Ambulatory Visit: Payer: Self-pay | Admitting: General Surgery

## 2016-05-28 DIAGNOSIS — K602 Anal fissure, unspecified: Secondary | ICD-10-CM

## 2016-05-30 ENCOUNTER — Ambulatory Visit
Admission: RE | Admit: 2016-05-30 | Discharge: 2016-05-30 | Disposition: A | Discharge status: Home / Self Care | Payer: Commercial Managed Care - HMO | Source: Ambulatory Visit | Attending: General Surgery | Admitting: General Surgery

## 2016-05-30 DIAGNOSIS — K602 Anal fissure, unspecified: Secondary | ICD-10-CM

## 2016-05-30 DIAGNOSIS — R102 Pelvic and perineal pain: Secondary | ICD-10-CM | POA: Diagnosis not present

## 2016-05-30 DIAGNOSIS — N898 Other specified noninflammatory disorders of vagina: Secondary | ICD-10-CM | POA: Diagnosis not present

## 2016-05-30 MED ORDER — IOPAMIDOL (ISOVUE-300) INJECTION 61%
100.0000 mL | Freq: Once | INTRAVENOUS | Status: AC | PRN
  Administered 2016-05-30: 100 mL via INTRAVENOUS

## 2016-06-10 DIAGNOSIS — N824 Other female intestinal-genital tract fistulae: Secondary | ICD-10-CM | POA: Diagnosis not present

## 2016-06-11 ENCOUNTER — Telehealth: Payer: Self-pay | Admitting: Internal Medicine

## 2016-06-11 NOTE — Telephone Encounter (Signed)
Notified office that patient will need appointment prior to clearance

## 2016-06-11 NOTE — Telephone Encounter (Signed)
Request for surgical clearance:  1. What type of surgery is being performed? Colonvaginal Fistula Repair 2.When is this surgery scheduled? Have not been scheduled   2. Are there any medications that need to be held prior to surgery and how long?General clearance   3. Name of physician performing surgery? Dr [name]   4. What is your office phone and fax number? [phone] and fax is [phone]

## 2016-06-12 ENCOUNTER — Encounter (HOSPITAL_COMMUNITY): Payer: Self-pay

## 2016-06-12 ENCOUNTER — Emergency Department (HOSPITAL_COMMUNITY): Payer: Commercial Managed Care - HMO

## 2016-06-12 ENCOUNTER — Ambulatory Visit (INDEPENDENT_AMBULATORY_CARE_PROVIDER_SITE_OTHER): Payer: Commercial Managed Care - HMO | Admitting: Physician Assistant

## 2016-06-12 ENCOUNTER — Inpatient Hospital Stay (HOSPITAL_COMMUNITY)
Admission: EM | Admit: 2016-06-12 | Discharge: 2016-06-17 | DRG: 309 | Disposition: A | Discharge status: Home / Self Care | Payer: Commercial Managed Care - HMO | Attending: Cardiovascular Disease | Admitting: Cardiovascular Disease

## 2016-06-12 ENCOUNTER — Ambulatory Visit: Payer: Commercial Managed Care - HMO | Admitting: Family

## 2016-06-12 VITALS — HR 157 | Ht 64.0 in | Wt 186.0 lb

## 2016-06-12 DIAGNOSIS — K5792 Diverticulitis of intestine, part unspecified, without perforation or abscess without bleeding: Secondary | ICD-10-CM | POA: Diagnosis present

## 2016-06-12 DIAGNOSIS — R079 Chest pain, unspecified: Secondary | ICD-10-CM

## 2016-06-12 DIAGNOSIS — N321 Vesicointestinal fistula: Secondary | ICD-10-CM | POA: Diagnosis not present

## 2016-06-12 DIAGNOSIS — Z23 Encounter for immunization: Secondary | ICD-10-CM

## 2016-06-12 DIAGNOSIS — Z9884 Bariatric surgery status: Secondary | ICD-10-CM

## 2016-06-12 DIAGNOSIS — K219 Gastro-esophageal reflux disease without esophagitis: Secondary | ICD-10-CM | POA: Diagnosis present

## 2016-06-12 DIAGNOSIS — Z823 Family history of stroke: Secondary | ICD-10-CM | POA: Diagnosis not present

## 2016-06-12 DIAGNOSIS — I5032 Chronic diastolic (congestive) heart failure: Secondary | ICD-10-CM | POA: Diagnosis not present

## 2016-06-12 DIAGNOSIS — N390 Urinary tract infection, site not specified: Secondary | ICD-10-CM | POA: Diagnosis not present

## 2016-06-12 DIAGNOSIS — F411 Generalized anxiety disorder: Secondary | ICD-10-CM | POA: Diagnosis present

## 2016-06-12 DIAGNOSIS — Z87891 Personal history of nicotine dependence: Secondary | ICD-10-CM

## 2016-06-12 DIAGNOSIS — Z8249 Family history of ischemic heart disease and other diseases of the circulatory system: Secondary | ICD-10-CM

## 2016-06-12 DIAGNOSIS — R000 Tachycardia, unspecified: Secondary | ICD-10-CM

## 2016-06-12 DIAGNOSIS — B9620 Unspecified Escherichia coli [E. coli] as the cause of diseases classified elsewhere: Secondary | ICD-10-CM | POA: Diagnosis present

## 2016-06-12 DIAGNOSIS — F329 Major depressive disorder, single episode, unspecified: Secondary | ICD-10-CM | POA: Diagnosis present

## 2016-06-12 DIAGNOSIS — Z800 Family history of malignant neoplasm of digestive organs: Secondary | ICD-10-CM | POA: Diagnosis not present

## 2016-06-12 DIAGNOSIS — Z01818 Encounter for other preprocedural examination: Secondary | ICD-10-CM | POA: Diagnosis not present

## 2016-06-12 DIAGNOSIS — R0602 Shortness of breath: Secondary | ICD-10-CM | POA: Diagnosis present

## 2016-06-12 DIAGNOSIS — G629 Polyneuropathy, unspecified: Secondary | ICD-10-CM

## 2016-06-12 DIAGNOSIS — I110 Hypertensive heart disease with heart failure: Secondary | ICD-10-CM | POA: Diagnosis not present

## 2016-06-12 DIAGNOSIS — I4891 Unspecified atrial fibrillation: Secondary | ICD-10-CM | POA: Diagnosis not present

## 2016-06-12 DIAGNOSIS — I480 Paroxysmal atrial fibrillation: Secondary | ICD-10-CM | POA: Diagnosis not present

## 2016-06-12 DIAGNOSIS — E785 Hyperlipidemia, unspecified: Secondary | ICD-10-CM | POA: Diagnosis present

## 2016-06-12 DIAGNOSIS — R0789 Other chest pain: Secondary | ICD-10-CM | POA: Diagnosis not present

## 2016-06-12 DIAGNOSIS — Z8261 Family history of arthritis: Secondary | ICD-10-CM | POA: Diagnosis not present

## 2016-06-12 DIAGNOSIS — Z6832 Body mass index (BMI) 32.0-32.9, adult: Secondary | ICD-10-CM

## 2016-06-12 DIAGNOSIS — J449 Chronic obstructive pulmonary disease, unspecified: Secondary | ICD-10-CM | POA: Diagnosis present

## 2016-06-12 DIAGNOSIS — J45909 Unspecified asthma, uncomplicated: Secondary | ICD-10-CM | POA: Diagnosis present

## 2016-06-12 DIAGNOSIS — I959 Hypotension, unspecified: Secondary | ICD-10-CM | POA: Diagnosis not present

## 2016-06-12 DIAGNOSIS — M797 Fibromyalgia: Secondary | ICD-10-CM | POA: Diagnosis present

## 2016-06-12 DIAGNOSIS — F32A Depression, unspecified: Secondary | ICD-10-CM | POA: Diagnosis present

## 2016-06-12 LAB — TROPONIN I: Troponin I: <0.03 ng/mL (ref <0.03)

## 2016-06-12 LAB — URINE MICROSCOPIC-ADD ON: RBC / HPF: NONE SEEN RBC/hpf (ref 0–5)

## 2016-06-12 LAB — URINALYSIS, ROUTINE W REFLEX MICROSCOPIC
Glucose, UA: NEGATIVE mg/dL
Hgb urine dipstick: NEGATIVE
KETONES UR: NEGATIVE mg/dL
NITRITE: NEGATIVE
PH: 5.5 (ref 5.0–8.0)
Protein, ur: NEGATIVE mg/dL
SPECIFIC GRAVITY, URINE: 1.021 (ref 1.005–1.030)

## 2016-06-12 LAB — COMPREHENSIVE METABOLIC PANEL
ALBUMIN: 3.7 g/dL (ref 3.5–5.0)
ALT: 17 U/L (ref 14–54)
AST: 26 U/L (ref 15–41)
Alkaline Phosphatase: 103 U/L (ref 38–126)
Anion gap: 11 (ref 5–15)
BILIRUBIN TOTAL: 1 mg/dL (ref 0.3–1.2)
BUN: 16 mg/dL (ref 6–20)
CHLORIDE: 104 mmol/L (ref 101–111)
CO2: 26 mmol/L (ref 22–32)
Calcium: 9.3 mg/dL (ref 8.9–10.3)
Creatinine, Ser: 0.94 mg/dL (ref 0.44–1.00)
GFR calc Af Amer: >60 mL/min (ref >60)
GFR calc non Af Amer: >60 mL/min (ref >60)
GLUCOSE: 110 mg/dL — AB (ref 65–99)
POTASSIUM: 4 mmol/L (ref 3.5–5.1)
SODIUM: 141 mmol/L (ref 135–145)
Total Protein: 6.8 g/dL (ref 6.5–8.1)

## 2016-06-12 LAB — CBC WITH DIFFERENTIAL/PLATELET
BASOS ABS: 0.1 10*3/uL (ref 0.0–0.1)
BASOS PCT: 1 %
EOS ABS: 0.2 10*3/uL (ref 0.0–0.7)
Eosinophils Relative: 3 %
HCT: 42.8 % (ref 36.0–46.0)
Hemoglobin: 13.6 g/dL (ref 12.0–15.0)
LYMPHS PCT: 36 %
Lymphs Abs: 2.8 10*3/uL (ref 0.7–4.0)
MCH: 30.2 pg (ref 26.0–34.0)
MCHC: 31.8 g/dL (ref 30.0–36.0)
MCV: 94.9 fL (ref 78.0–100.0)
Monocytes Absolute: 0.3 10*3/uL (ref 0.1–1.0)
Monocytes Relative: 4 %
Neutro Abs: 4.5 10*3/uL (ref 1.7–7.7)
Neutrophils Relative %: 56 %
PLATELETS: 352 10*3/uL (ref 150–400)
RBC: 4.51 MIL/uL (ref 3.87–5.11)
RDW: 12.7 % (ref 11.5–15.5)
WBC: 8 10*3/uL (ref 4.0–10.5)

## 2016-06-12 LAB — CBC
HEMATOCRIT: 48.8 % — AB (ref 36.0–46.0)
Hemoglobin: 15.9 g/dL — ABNORMAL HIGH (ref 12.0–15.0)
MCH: 30.8 pg (ref 26.0–34.0)
MCHC: 32.6 g/dL (ref 30.0–36.0)
MCV: 94.6 fL (ref 78.0–100.0)
PLATELETS: 400 10*3/uL (ref 150–400)
RBC: 5.16 MIL/uL — ABNORMAL HIGH (ref 3.87–5.11)
RDW: 12.5 % (ref 11.5–15.5)
WBC: 9 10*3/uL (ref 4.0–10.5)

## 2016-06-12 LAB — BASIC METABOLIC PANEL
Anion gap: 7 (ref 5–15)
BUN: 15 mg/dL (ref 6–20)
CHLORIDE: 105 mmol/L (ref 101–111)
CO2: 25 mmol/L (ref 22–32)
CREATININE: 0.95 mg/dL (ref 0.44–1.00)
Calcium: 8.7 mg/dL — ABNORMAL LOW (ref 8.9–10.3)
Glucose, Bld: 223 mg/dL — ABNORMAL HIGH (ref 65–99)
Potassium: 3.5 mmol/L (ref 3.5–5.1)
SODIUM: 137 mmol/L (ref 135–145)

## 2016-06-12 LAB — MAGNESIUM: Magnesium: 2.1 mg/dL (ref 1.7–2.4)

## 2016-06-12 LAB — TSH
TSH: 2.513 u[IU]/mL (ref 0.350–4.500)
TSH: 7.795 u[IU]/mL — AB (ref 0.350–4.500)

## 2016-06-12 MED ORDER — FERROUS SULFATE 325 (65 FE) MG PO TABS
325.0000 mg | ORAL_TABLET | ORAL | Status: DC
  Administered 2016-06-13 – 2016-06-16 (×2): 325 mg via ORAL
  Filled 2016-06-12: qty 1

## 2016-06-12 MED ORDER — DILTIAZEM HCL 100 MG IV SOLR
5.0000 mg/h | INTRAVENOUS | Status: DC
  Administered 2016-06-12: 5 mg/h via INTRAVENOUS
  Filled 2016-06-12: qty 100

## 2016-06-12 MED ORDER — ALPRAZOLAM 0.5 MG PO TABS: 0.5000 mg | ORAL_TABLET | Freq: Every evening | ORAL | Status: DC | PRN

## 2016-06-12 MED ORDER — ONDANSETRON HCL 4 MG/2ML IJ SOLN: 4.0000 mg | Freq: Four times a day (QID) | INTRAMUSCULAR | Status: DC | PRN

## 2016-06-12 MED ORDER — SODIUM CHLORIDE 0.9 % IV BOLUS (SEPSIS)
1000.0000 mL | Freq: Once | INTRAVENOUS | Status: AC
  Administered 2016-06-12: 1000 mL via INTRAVENOUS

## 2016-06-12 MED ORDER — APIXABAN 5 MG PO TABS
5.0000 mg | ORAL_TABLET | Freq: Once | ORAL | Status: AC
  Administered 2016-06-12: 5 mg via ORAL
  Filled 2016-06-12: qty 1

## 2016-06-12 MED ORDER — PANTOPRAZOLE SODIUM 40 MG PO TBEC
40.0000 mg | DELAYED_RELEASE_TABLET | Freq: Every day | ORAL | Status: DC
  Administered 2016-06-12 – 2016-06-17 (×6): 40 mg via ORAL
  Filled 2016-06-12 (×6): qty 1

## 2016-06-12 MED ORDER — APIXABAN 5 MG PO TABS
5.0000 mg | ORAL_TABLET | Freq: Two times a day (BID) | ORAL | Status: DC
  Administered 2016-06-12 – 2016-06-17 (×10): 5 mg via ORAL
  Filled 2016-06-12 (×10): qty 1

## 2016-06-12 MED ORDER — DULOXETINE HCL 60 MG PO CPEP
60.0000 mg | ORAL_CAPSULE | Freq: Every day | ORAL | Status: DC
  Administered 2016-06-12 – 2016-06-16 (×5): 60 mg via ORAL
  Filled 2016-06-12 (×5): qty 1

## 2016-06-12 MED ORDER — ALBUTEROL SULFATE (2.5 MG/3ML) 0.083% IN NEBU: 2.5000 mg | INHALATION_SOLUTION | Freq: Four times a day (QID) | RESPIRATORY_TRACT | Status: DC | PRN

## 2016-06-12 MED ORDER — ACETAMINOPHEN 325 MG PO TABS
650.0000 mg | ORAL_TABLET | Freq: Once | ORAL | Status: AC
  Administered 2016-06-12: 650 mg via ORAL
  Filled 2016-06-12: qty 2

## 2016-06-12 MED ORDER — DILTIAZEM LOAD VIA INFUSION
20.0000 mg | Freq: Once | INTRAVENOUS | Status: AC
  Administered 2016-06-12: 20 mg via INTRAVENOUS
  Filled 2016-06-12: qty 20

## 2016-06-12 MED ORDER — FUROSEMIDE 20 MG PO TABS
20.0000 mg | ORAL_TABLET | Freq: Two times a day (BID) | ORAL | Status: DC
  Administered 2016-06-13: 20 mg via ORAL
  Filled 2016-06-12: qty 1

## 2016-06-12 MED ORDER — ACETAMINOPHEN 325 MG PO TABS: 650.0000 mg | ORAL_TABLET | ORAL | Status: DC | PRN

## 2016-06-12 MED ORDER — ALBUTEROL SULFATE (2.5 MG/3ML) 0.083% IN NEBU: 2.5000 mg | INHALATION_SOLUTION | RESPIRATORY_TRACT | Status: DC | PRN

## 2016-06-12 MED ORDER — AMOXICILLIN-POT CLAVULANATE 875-125 MG PO TABS
1.0000 | ORAL_TABLET | Freq: Two times a day (BID) | ORAL | Status: DC
  Administered 2016-06-12 – 2016-06-14 (×4): 1 via ORAL
  Filled 2016-06-12 (×4): qty 1

## 2016-06-12 MED ORDER — GABAPENTIN 100 MG PO CAPS
100.0000 mg | ORAL_CAPSULE | Freq: Two times a day (BID) | ORAL | Status: DC
  Administered 2016-06-12 – 2016-06-17 (×10): 100 mg via ORAL
  Filled 2016-06-12 (×10): qty 1

## 2016-06-12 NOTE — ED Triage Notes (Signed)
Pt sent from cardiologist office with HR 140-150's. Pt does endorse SOB, dizziness, and nausea. Pt denies vomiting, LOC, or chest pain. Pt a&ox4.

## 2016-06-12 NOTE — ED Notes (Signed)
Pt ambulated to RR, when returning pt HR 110-120's. Per Dr. Gilford Raid to restart cardizem drip.

## 2016-06-12 NOTE — Progress Notes (Signed)
Cardiology Office Note Date:  06/12/2016  Patient ID:  Sabrina [name], Sabrina [name] 1946/04/02, MRN [id number] PCP:  Evelina Dun, FNP  Cardiologist:  Dr. Lovena Le     Chief Complaint: pre-op evaluation,   History of Present Illness: Sabrina [name] is a 70 y.o. 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  From a cardiac standpoint, history includes HLD, ?HTN,  and diastolic CHF, previously/last seen by Dr. Lovena Le January 2016, at that time with c/o CP/SOB, having undergone echo and stress testing that was negative, he symptoms suspected at least in part to diastolic CHF (as well as obesity, and rx symbicort by pulmonary service) and tx with lasix.  + family cardiac history, reports her mother had heart attacks and strokes.  She comes in today to be seen for Dr. Lovena Le for pre-op evaluation She reports seeing her surgeon either Monday or tuesday and told of a recurrent infection and started on Augmentin.  She reports on Saturday over the weekend she suddenly felt like something had changed, she was driving had trouble concentrating or felt like she wasn't sure what was happening, with some blurred vision, pulled over and her boyfriend drove home, she did not have any other symptoms no focal weakness, no near syncope or syncope, no CP and did not perceive her heart racing.  She took her BP and noted it low with fast HR on the machine.  She has been having chills with cold sweats on/off since the weekend and waxing/waning chronic abdominal pain (LLQ) and nausea.  She did not seek attention since she had a doctor's appointment Tuesday, kept an eye on  her BP/HR both seemed erratic to her high and low, Tuesday she saw the surgeon, was told her heart beat was fast and needed to see the cardiologist, and started on the antibiotic.  She states she still has feeling of being disconnected like she doesn't feel like herself, generally weak, again no focal weakness, no speech difficulties, no CP, she does feel palpitations.    Past Medical History:  Diagnosis Date  . Allergy   . Anemia   . Anxiety   . Arthritis   . Cataract   . Chronic diastolic heart failure ([place])   . Collagen vascular disease ([place])   . Depression   . Fibromyalgia   . GERD (gastroesophageal reflux disease)   . Hyperlipidemia   . Neuropathy ([facility])    legs and feet  . Tubular adenoma of colon     Past Surgical History:  Procedure Laterality Date  . ABDOMINAL HYSTERECTOMY    . APPENDECTOMY    . broken hip     left; metal  . CHOLECYSTECTOMY  1978  . GASTRIC BYPASS  2002  . SPLENECTOMY  2002  . TONSILLECTOMY  1963    Current Outpatient Prescriptions  Medication Sig Dispense Refill  . albuterol (PROVENTIL HFA;VENTOLIN HFA) 108 (90 BASE) MCG/ACT inhaler Inhale 2 puffs into the lungs every 4 (four) hours as needed for wheezing or shortness of breath. Reported on 11/22/2015    . albuterol (PROVENTIL) (2.5 MG/3ML) 0.083% nebulizer solution Take 3 mLs (2.5 mg total) by nebulization every 6 (six) hours as needed for wheezing or shortness of breath. 150 mL 1  . ALPRAZolam (XANAX) 0.5  MG tablet TAKE 1 TABLET BY MOUTH AT BEDTIME AS NEEDED FOR ANXIETY 45 tablet 1  . ciprofloxacin (CIPRO) 500 MG tablet Take 1 tablet (500 mg total) by mouth 2 (two) times daily. 14 tablet 0  . [employer] IRON 325 (65 FE) MG tablet TAKE 1 TABLET (325 MG TOTAL) BY MOUTH DAILY WITH BREAKFAST. 30 tablet 5  . DULoxetine (CYMBALTA) 60 MG capsule TAKE 1 CAPSULE (60 MG TOTAL) BY MOUTH DAILY. 30 capsule 0  . fluticasone (FLONASE) 50 MCG/ACT nasal spray Place 2 sprays into both nostrils daily. 16 g 5  . furosemide  (LASIX) 20 MG tablet TAKE 1 TABLET (20 MG TOTAL) BY MOUTH 2 (TWO) TIMES DAILY. 60 tablet 3  . gabapentin (NEURONTIN) 100 MG capsule Take 1 Tab for 1 day, then 1 tab twice a day, then 1 tab TID 270 capsule 1  . halobetasol (ULTRAVATE) 0.05 % cream APPLY TOPICALLY 2 (TWO) TIMES DAILY. 50 g 2  . nitrofurantoin, macrocrystal-monohydrate, (MACROBID) 100 MG capsule Take 1 capsule (100 mg total) by mouth 2 (two) times daily. 14 capsule 0  . omeprazole (PRILOSEC) 40 MG capsule Take 1 capsule (40 mg total) by mouth daily. Open capsule and sprinkle on pudding, apple sauce or something soft.  One every am on an empty stomach 20 -30 minutes prior to breakfast. 30 capsule 5  . Vitamin D, Ergocalciferol, (DRISDOL) 50000 units CAPS capsule TAKE 1 CAPSULE (50,000 UNITS TOTAL) BY MOUTH EVERY 7 (SEVEN) DAYS. 12 capsule 0   No current facility-administered medications for this visit.     Allergies:   Codeine   Social History:  The patient  reports that she quit smoking about 23 years ago. She has never used smokeless tobacco. She reports that she does not drink alcohol or use drugs.   Family History:  The patient's family history includes Alzheimer's disease in her mother; Arthritis in her father; Cancer in her mother; Colon cancer (age of onset: 33) in her paternal uncle.  ROS:  Please see the history of present illness.  All other systems are reviewed and otherwise negative.   PHYSICAL EXAM:   Left 110/78   Right 116/80 VS:  Pulse (!) 157   Ht 5\' 4"  (1.626 m)   Wt 186 lb (84.4 kg)   BMI 31.93 kg/m  BMI: Body mass index is 31.93 kg/m. Well nourished, well developed, appears in no acute distress  HEENT: normocephalic, atraumatic  Neck: no JVD, carotid bruits or masses Cardiac:  Irregular and tachycardic; no significant murmurs, no rubs, or gallops Lungs:  clear to auscultation bilaterally, no wheezing, rhonchi or rales, normal respiratory rate  Abd: soft, nontender to light palp MS: no deformity or  atrophy Ext: no edema  Skin: warm and dry, no rash Neuro:  No gross deficits appreciated Psych: euthymic mood, full affect   EKG:  Done today shows rapid AFib, 157bpm  03/31/14: TTE Study Conclusions - Left ventricle: The cavity size was normal. Wall thickness was increased in a pattern of mild LVH. Systolic function was normal. The estimated ejection fraction was in the range of 55% to 60%. Wall motion was normal; there were no regional wall motion abnormalities. Features are consistent with a pseudonormal left ventricular filling pattern, with concomitant abnormal relaxation and increased filling pressure (grade 2 diastolic dysfunction). Mildly elevated filling pressures. - Aortic valve: There was trivial regurgitation. - Mitral valve: There was mild regurgitation. - Left atrium: The atrium was mildly dilated. - Tricuspid valve: There was mild regurgitation.  07/24/14:  Lexiscan stress myoview IMPRESSION: 1. No reversible ischemia or infarction. 2. Normal left ventricular wall motion. 3. Left ventricular ejection fraction 67% 4. Low-risk stress test findings*.  Recent Labs: 10/19/2015: TSH 3.490 04/21/2016: ALT 39; BUN 22; Creatinine, Ser 0.80; Hemoglobin 14.8; Platelets 408.0; Potassium 5.0; Sodium 136  10/19/2015: Chol/HDL Ratio 3.1; Cholesterol, Total 219; HDL 71; LDL Calculated 128; Triglycerides 101   CrCl cannot be calculated (Patient's most recent lab result is older than the maximum 21 days allowed.).   Wt Readings from Last 3 Encounters:  06/12/16 186 lb (84.4 kg)  04/21/16 185 lb 3.2 oz (84 kg)  02/13/16 185 lb (83.9 kg)     Other studies reviewed: Additional studies/records reviewed today include: summarized above  ASSESSMENT AND PLAN:  1. New onset rapid AFib     BP ok  Significant abdominal issues with recurrent infections and suspect colonic/vaginal fistula pending surgical intervention, not yet scheduled  2. Pre-operative risk assesment      Not cleared at this time  The patient was seen along with Dr. Acie Fredrickson (DOD), given her recurrent infection and known abdominal issues, concerns for a more significant underlying process and rapid AF may be a symptom rather then primary process, and given her symptoms over the weekend, recommend she go to the ER for admission and further evaluation and management with the hospitalist team.  The patient is accompanied by her boyfriend who drove her, she declines EMS transport, her boyfriend will take her directly to [facility] ER  Disposition: referred to the ER, I have called and spoken to the triage, they are aware of her pending arrial  Current medicines are reviewed at length with the patient today.  The patient did not have any concerns regarding medicines.  Haywood Lasso, PA-C 06/12/2016 11:43 AM     [facility] [address] [place] [address] [phone] (office)  [phone] (fax)  Attending Note:   The patient was seen and examined.  Agree with assessment and plan as noted above.  Changes made to the above note as needed.  Patient seen and independently examined with Vick Frees, PA .   We discussed all aspects of the encounter. I agree with the assessment and plan as stated above.  The patient has a difficult issue. Needs cardiology clearance but presents with new onset atrial fib with RVR. I suspect that she has an underlying UTI - has a known colo-vaginal fistula and has frequent UTIs  It will be very difficult to get her under good control as an OP. I think she should be admitted to the hospital where we can get control over her atrial fib and get her evaluation done in a very timely fashion. She does not need to have multiple delays prior to this surgery to fix her colovaginal fistula.   I have spent a total of 40 minutes with patient reviewing hospital  notes , telemetry, EKGs, labs and examining patient as well as establishing an  assessment and plan that was discussed with the patient. > 50% of time was spent in direct patient care.    Thayer Headings, Brooke Bonito., MD, [facility] 06/12/2016, 5:56 PM [address],  [place] Pager [phone]

## 2016-06-12 NOTE — ED Notes (Signed)
Walked pt to restroom. She walked without help. I asked if she felt dizzy or anything and no said no.

## 2016-06-12 NOTE — ED Notes (Signed)
Dr. Gilford Raid okay with cardizem being paused for now.

## 2016-06-12 NOTE — ED Notes (Signed)
Portable xray at bedside.

## 2016-06-12 NOTE — ED Notes (Signed)
Notified Dr. Fannie Knee that pts BP dropped to 79/61 after 20mg  bolus of cardizem. Drip was cut off and pt BP was 93/67 afterwards. Pt a&ox4.

## 2016-06-12 NOTE — ED Notes (Signed)
Pt given ice per Dr. Gilford Raid.

## 2016-06-12 NOTE — H&P (Signed)
Cardiology H&P    Patient ID: CYNDY ACQUISTO MRN: [id number], DOB/AGE: [birth year]   Admit date: 06/12/2016 Date of Consult: 06/12/2016  Primary Physician: Evelina Dun, FNP Primary Cardiologist: New    Patient Profile    History of Present Illness  DEBORAH KISNER is a 70 y.o. 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  From a cardiac standpoint, history includes HLD, ?HTN,  and diastolic CHF, previously/last seen by Dr. Lovena Le January 2016, at that time with c/o CP/SOB, having undergone echo and stress testing that was negative, he symptoms suspected at least in part to diastolic CHF (as well as obesity, and rx symbicort by pulmonary service) and tx with lasix.  + family cardiac history, reports her mother had heart attacks and strokes.  She comes in today to be seen for Dr. Lovena Le for pre-op evaluation She reports seeing her surgeon either Monday or tuesday and told of a recurrent infection and started on Augmentin.  She reports on Saturday over the weekend she suddenly felt like something had changed, she was driving had trouble concentrating or felt like she wasn't sure what was happening, with some blurred vision, pulled over and her boyfriend drove home, she did not have any other symptoms no focal weakness, no near syncope or syncope, no CP and did not perceive her heart racing.  She took her BP and noted it low with fast HR on the machine.  She has been having chills with cold sweats on/off since the weekend and waxing/waning chronic abdominal pain (LLQ) and nausea.  She did not seek attention since she had a doctor's  appointment Tuesday, kept an eye on her BP/HR both seemed erratic to her high and low, Tuesday she saw the surgeon, was told her heart beat was fast and needed to see the cardiologist, and started on the antibiotic.  She states she still has feeling of being disconnected like she doesn't feel like herself, generally weak, again no focal weakness, no speech difficulties, no CP, she does feel palpitations.     Past Medical History   Past Medical History:  Diagnosis Date  . Allergy   . Anemia   . Anxiety   . Arthritis   . Cataract   . Chronic diastolic heart failure ([place])   . Collagen vascular disease ([place])   . Depression   . Fibromyalgia   . GERD (gastroesophageal reflux disease)   . Hyperlipidemia   . Neuropathy (HCC)    legs and feet  . Tubular adenoma of colon     Past Surgical History:  Procedure Laterality Date  . ABDOMINAL HYSTERECTOMY    . APPENDECTOMY    . broken hip     left; metal  . CHOLECYSTECTOMY  1978  . GASTRIC BYPASS  2002  . SPLENECTOMY  2002  . TONSILLECTOMY  1963     Allergies  Allergies  Allergen Reactions  . Codeine Nausea And Vomiting    Inpatient Medications    . acetaminophen  650 mg Oral Once  . apixaban  5 mg Oral Once    Family History    Family History  Problem Relation Age of Onset  . Cancer Mother   . Alzheimer's disease Mother   . Arthritis Father   .  Colon cancer Paternal Uncle 90    Social History    Social History   Social History  . Marital status: Divorced    Spouse name: N/A  . Number of children: N/A  . Years of education: N/A   Occupational History  . Not on file.   Social History Main Topics  . Smoking status: Former Smoker    Quit date: 02/26/1993  . Smokeless tobacco: Never Used  . Alcohol use No  . Drug use: No  . Sexual activity: Not on file   Other Topics Concern  . Not on file   Social History Narrative  . No narrative on file     Review of Systems    General:  No chills, fever, night  sweats or weight changes.  Cardiovascular:  No chest pain, dyspnea on exertion, edema, orthopnea, palpitations, paroxysmal nocturnal dyspnea. Dermatological: No rash, lesions/masses Respiratory: No cough, dyspnea Urologic: No hematuria, dysuria Abdominal:   No nausea, vomiting, diarrhea, bright red blood per rectum, melena, or hematemesis Neurologic:  No visual changes, wkns, changes in mental status. All other systems reviewed and are otherwise negative except as noted above.  Physical Exam    Blood pressure 102/59, pulse (!) 124, resp. rate (!) 27, height 5\' 4"  (1.626 m), weight 186 lb (84.4 kg), SpO2 100 %.  Left 110/78   Right 116/80 VS:  Pulse (!) 157   Ht 5\' 4"  (1.626 m)   Wt 186 lb (84.4 kg)   BMI 31.93 kg/m  BMI: Body mass index is 31.93 kg/m. Well nourished, well developed, appears in no acute distress  HEENT: normocephalic, atraumatic  Neck: no JVD, carotid bruits or masses Cardiac:  Irregular and tachycardic; no significant murmurs, no rubs, or gallops Lungs:  clear to auscultation bilaterally, no wheezing, rhonchi or rales, normal respiratory rate  Abd: soft, nontender to light palp MS: no deformity or atrophy Ext: no edema  Skin: warm and dry, no rash Neuro:  No gross deficits appreciated Psych: euthymic mood, full affect  Labs     Recent Labs  06/12/16 1235  TROPONINI <0.03   Lab Results  Component Value Date   WBC 9.0 06/12/2016   HGB 15.9 (H) 06/12/2016   HCT 48.8 (H) 06/12/2016   MCV 94.6 06/12/2016   PLT 400 06/12/2016    Recent Labs Lab 06/12/16 1235  NA 141  K 4.0  CL 104  CO2 26  BUN 16  CREATININE 0.94  CALCIUM 9.3  PROT 6.8  BILITOT 1.0  ALKPHOS 103  ALT 17  AST 26  GLUCOSE 110*     Radiology Studies    Ct Pelvis W Contrast  Result Date: 05/30/2016 CLINICAL DATA:  Evaluate for rectal fistula. Feces in urine. Patient passes a year from her vagina. Fall vaginal discharge and pelvic pain EXAM: CT PELVIS WITH CONTRAST TECHNIQUE:  Multidetector CT imaging of the pelvis was performed using the standard protocol following the bolus administration of intravenous contrast. Rectal contrast also administered. CONTRAST:  172mL ISOVUE-300 IOPAMIDOL (ISOVUE-300) INJECTION 61% COMPARISON:  CT abdomen pelvis 01/24/2016 FINDINGS: Urinary Tract: Tiny air pocket is present in anticipated location of the urethra. Urinary bladder is only filled with small amount of urine. Bladder is otherwise unremarkable. No gas collections are present within the bladder. Distal ureters unremarkable. Bowel: Numerous diverticula are present involving the descending and sigmoid colon. No significant bowel wall thickening or pericolonic edema to suggest acute inflammation. No dilated distal small bowel. Vascular/Lymphatic: Scattered atherosclerotic calcifications in the distal  aorta and iliac vessels. Reproductive: The uterus is surgically absent. There is a small amount of gas present within the vaginal vault. On the axial views, series 3., image number 36, there is thin crescent of slight increased density suggesting contrast within the vagina. Linear gas collection extends anteriorly from the left anterior aspect of the rectum towards the posterior wall of the vagina, series 3., image number 42. Findings could relate to rectal vaginal fistula. Other: No significant free pelvic fluid. Isolated surgical clips present in the right hemi abdomen. Musculoskeletal: There is moderate-to-marked disc space narrowing at L4-L5 and L5-S1 with vacuum discs and osteophytes. IMPRESSION: 1. Small pockets of gas within the vagina with trace amount of contrast present in the vaginal vault. Small linear gas collection extending from the left anterior aspect of the rectum towards the posterior wall of the vagina ; collective findings are suspicious for rectovaginal fistula. 2. Numerous colon diverticula without CT evidence for acute diverticulitis. 3. Atherosclerotic vascular calcifications of  the aorta and iliac vessels. Electronically Signed   By: Donavan Foil M.D.   On: 05/30/2016 17:02   Dg Chest Port 1 View  Result Date: 06/12/2016 CLINICAL DATA:  70 y/o F; atrial fibrillation and fluctuating blood pressure. EXAM: PORTABLE CHEST 1 VIEW COMPARISON:  03/19/2014 chest radiograph. FINDINGS: Stable cardiomediastinal silhouette within normal limits. Aortic atherosclerosis with calcification of the arch. Surgical clips project over left upper quadrant. Linear opacities at left lung base probably represent atelectasis or scarring and are similar to the prior study. No focal consolidation, pneumothorax, or pleural effusion. No acute osseous abnormality. IMPRESSION: No active disease. Electronically Signed   By: Kristine Garbe M.D.   On: 06/12/2016 13:36    EKG & Cardiac Imaging    [id number] today shows rapid AFib, 157bpm    03/31/14: TTE Study Conclusions - Left ventricle: The cavity size was normal. Wall thickness was increased in a pattern of mild LVH. Systolic function was normal. The estimated ejection fraction was in the range of 55% to 60%. Wall motion was normal; there were no regional wall motion abnormalities. Features are consistent with a pseudonormal left ventricular filling pattern, with concomitant abnormal relaxation and increased filling pressure (grade 2 diastolic dysfunction). Mildly elevated filling pressures. - Aortic valve: There was trivial regurgitation. - Mitral valve: There was mild regurgitation. - Left atrium: The atrium was mildly dilated. - Tricuspid valve: There was mild regurgitation  07/24/14: Lexiscan stress myoview IMPRESSION: 1. No reversible ischemia or infarction. 2. Normal left ventricular wall motion. 3. Left ventricular ejection fraction 67% 4. Low-risk stress test findings*.     Assessment & Plan  1. New onset atrial fibrillation: Ms. Randolm Idol presented to the office for surgical clearance for abdominal surgery.  She was noted to be in rapid Afib, which is new for her. She describes an event on Saturday (5 days ago), when she was driving and felt like she could no longer concentrate, had some blurred vision. Notes that her HR was high by home BP cuff. She was in rapid Afib today in the office and was sent to ED as there is some concern for underlying process, given her symptoms over the weekend and she has recurrent infection and known abdominal issues.   Would continue diltiazem gtt, currently at 7.5 mg/hr. Had some hypotension when gtt was first started, BP soft now but ok and patient is not symptomatic.   This patients CHA2DS2-VASc Score and unadjusted Ischemic Stroke Rate (% per year) is equal to 3.2 %  stroke rate/year from a score of 3 Above score calculated as 1 point each if present [CHF, HTN, DM, Vascular=MI/PAD/Aortic Plaque, Age if 65-74, or Female], 2 points each if present [Age > 75, or Stroke/TIA/TE]  On Eliquis for anticoagulation.   Internal medicine has been consulted for other medical issues. She has a history of  severe left-sided diverticulosis, adenomatous colon polyps, recurrent UTI and suspicion of colovesicular fistula.      Signed, Arbutus Leas, NP 06/12/2016, 4:13 PM Pager: [phone]  Attending Note:   The patient was seen and examined.  Agree with assessment and plan as noted above.  Changes made to the above note as needed.  Patient seen and independently examined with [name], PA  and Jettie Booze, NP .   We discussed all aspects of the encounter. I agree with the assessment and plan as stated above.  challanging case. Patient presents for preoperative evaluation prior to having a colovaginal fistula repaired. She does not have any history of atrial fib ablation. She presents now in rapid atrial fibrillation.  She has episodes of chills and flushed sensations. She also has diaphoresis.  Since the emergency room for control of her heart rate and to expedite her  preoperative evaluation. I think that she needs be started on rate controlling medication-either diltiazem or metoprolol.  Assuming that the surgery is not going to be for several days, I would start her on Eliquis 5 mg twice a day. We'll need to get an echocardiogram.  If the echo shows fairly normal function and we are able to get her heart rate well controlled, then we can safely clear her for her surgery. She has no signs or symptoms of angina and I do not think that she needs a stress Myoview study.   I have spent a total of 40 minutes with patient reviewing hospital  notes , telemetry, EKGs, labs and examining patient as well as establishing an assessment and plan that was discussed with the patient. > 50% of time was spent in direct patient care.    Thayer Headings, Brooke Bonito., MD, [facility] 06/12/2016, 4:44 PM [address],  [place] Pager [phone]

## 2016-06-12 NOTE — Consult Note (Signed)
Medical Consultation   [name]  [id number]  DOB: 14-May-1946  DOA: 06/12/2016  PCP: Evelina Dun, FNP   Outpatient Specialists:  [facility], cardiology   Requesting physician: Cardiology  Reason for consultation: Medical management of chronic medical conditions   History of Present Illness: Sabrina [name] is an 70 y.o. female anemia, anxiety, depression, fibromyalgia, diastolic CHF, GERD, HLD, peripheral neuropathy, colovesicular fistula presenting w/ Afib w/ RVR. Patient came [facility] after evaluation by cardiology on the same day as admission. Patient was being seen by cardiology for preop cardiac clearance requested by [facility] for surgical intervention for colovesicular fistula. While at her cardiology appointment patient developed palpitations and shortness of breath. EKG at her appointment confirmed that she was in A. fib with RVR. Patient declined EMS transport to the ED and came by private vehicle. Patient denies any active chest pain, LOC, dizziness/vertigo, fevers, chest pain, confusion. Patient does endorse chronic persistent left lower quadrant pain for which she is undergoing workup and is felt to be due to her colovesicular fistula. Patient also endorses fecal sediment in her urine which has gotten worse over the last several days. She also endorses worsening suprapubic tenderness. Denies any flank pain or fevers.  Of note patient also states that she feels that her Lasix is no longer working as she now takes the medicine without any increased diuresis. Patient also endorses a 15 pound weight gain over the last 3 months that she feels is predominately due to fluid retention.   Review of Systems:  ROS As per HPI otherwise 10 point review of systems negative.    Past Medical History: Past Medical History:  Diagnosis Date  . Allergy   . Anemia   . Anxiety   . Arthritis   . Cataract   . Chronic diastolic heart failure ([place])   . Collagen  vascular disease ([place])   . Depression   . Fibromyalgia   . Fistula   . GERD (gastroesophageal reflux disease)   . Hyperlipidemia   . Neuropathy (HCC)    legs and feet  . Tubular adenoma of colon     Past Surgical History: Past Surgical History:  Procedure Laterality Date  . ABDOMINAL HYSTERECTOMY    . APPENDECTOMY    . broken hip     left; metal  . CHOLECYSTECTOMY  1978  . GASTRIC BYPASS  2002  . SPLENECTOMY  2002  . TONSILLECTOMY  1963     Allergies:   Allergies  Allergen Reactions  . Codeine Nausea And Vomiting     Social History:  reports that she quit smoking about 23 years ago. She has never used smokeless tobacco. She reports that she does not drink alcohol or use drugs.   Family History: Family History  Problem Relation Age of Onset  . Cancer Mother   . Alzheimer's disease Mother   . Arthritis Father   . Colon cancer Paternal Uncle 22     Physical Exam: Vitals:   06/12/16 1530 06/12/16 1610 06/12/16 1625 06/12/16 1631  BP: 111/85 102/59 116/66 104/82  Pulse: 115 (!) 124  112  Resp:  (!) 27 23 22   SpO2:  100% 100% 99%  Weight:      Height:        General:  Appears calm and comfortable Eyes:  PERRL, EOMI, normal lids, iris ENT:  grossly normal hearing, lips & tongue, mmm Neck:  no LAD, masses or thyromegaly Cardiovascular: Irregularly irregular, tachycardic, no m/r/g. No LE edema.  Respiratory:  CTA bilaterally, no w/r/r. Normal respiratory effort. Abdomen:  Left lower quadrant tenderness to deep palpation, nondistended, suprapubic tenderness to palpation Skin:  no rash or induration seen on limited exam Musculoskeletal:  grossly normal tone BUE/BLE, good ROM, no bony abnormality Psychiatric:  grossly normal mood and affect, speech fluent and appropriate, AOx3 Neurologic:  CN 2-12 grossly intact, moves all extremities in coordinated fashion, sensation intact  Data reviewed:  I have personally reviewed following labs and imaging studies Labs:   CBC:  Recent Labs Lab 06/12/16 1235  WBC 9.0  HGB 15.9*  HCT 48.8*  MCV 94.6  PLT [id number]    Basic Metabolic Panel:  Recent Labs Lab 06/12/16 1235  NA 141  K 4.0  CL 104  CO2 26  GLUCOSE 110*  BUN 16  CREATININE 0.94  CALCIUM 9.3  MG 2.1   GFR Estimated Creatinine Clearance: 60.2 mL/min (by C-G formula based on SCr of 0.94 mg/dL). Liver Function Tests:  Recent Labs Lab 06/12/16 1235  AST 26  ALT 17  ALKPHOS 103  BILITOT 1.0  PROT 6.8  ALBUMIN 3.7   No results for input(s): LIPASE, AMYLASE in the last 168 hours. No results for input(s): AMMONIA in the last 168 hours. Coagulation profile No results for input(s): INR, PROTIME in the last 168 hours.  Cardiac Enzymes:  Recent Labs Lab 06/12/16 1235  TROPONINI <0.03   BNP: Invalid input(s): POCBNP CBG: No results for input(s): GLUCAP in the last 168 hours. D-Dimer No results for input(s): DDIMER in the last 72 hours. Hgb A1c No results for input(s): HGBA1C in the last 72 hours. Lipid Profile No results for input(s): CHOL, HDL, LDLCALC, TRIG, CHOLHDL, LDLDIRECT in the last 72 hours. Thyroid function studies  Recent Labs  06/12/16 1345  TSH 2.513   Anemia work up No results for input(s): VITAMINB12, FOLATE, FERRITIN, TIBC, IRON, RETICCTPCT in the last 72 hours. Urinalysis    Component Value Date/Time   COLORURINE YELLOW 06/12/2016 1520   APPEARANCEUR HAZY (A) 06/12/2016 1520   LABSPEC 1.021 06/12/2016 1520   PHURINE 5.5 06/12/2016 1520   GLUCOSEU NEGATIVE 06/12/2016 1520   GLUCOSEU NEGATIVE 04/21/2016 1142   HGBUR NEGATIVE 06/12/2016 1520   BILIRUBINUR SMALL (A) 06/12/2016 1520   BILIRUBINUR neg 10/19/2015 1050   KETONESUR NEGATIVE 06/12/2016 1520   PROTEINUR NEGATIVE 06/12/2016 1520   UROBILINOGEN 0.2 04/21/2016 1142   NITRITE NEGATIVE 06/12/2016 1520   LEUKOCYTESUR SMALL (A) 06/12/2016 1520     Microbiology No results found for this or any previous visit (from the past 240  hour(s)).     Inpatient Medications:   Scheduled Meds:   Continuous Infusions: . diltiazem (CARDIZEM) infusion 7.5 mg/hr (06/12/16 1620)     Radiological Exams on Admission: Dg Chest Port 1 View  Result Date: 06/12/2016 CLINICAL DATA:  70 y/o F; atrial fibrillation and fluctuating blood pressure. EXAM: PORTABLE CHEST 1 VIEW COMPARISON:  03/19/2014 chest radiograph. FINDINGS: Stable cardiomediastinal silhouette within normal limits. Aortic atherosclerosis with calcification of the arch. Surgical clips project over left upper quadrant. Linear opacities at left lung base probably represent atelectasis or scarring and are similar to the prior study. No focal consolidation, pneumothorax, or pleural effusion. No acute osseous abnormality. IMPRESSION: No active disease. Electronically Signed   By: Kristine Garbe M.D.   On: 06/12/2016 13:36    Impression/Recommendations Active Problems:   Chronic diastolic heart failure ([place])  Asthma   Generalized anxiety disorder   Depression   Colovesical fistula   Atrial fibrillation with RVR (HCC)   UTI (lower urinary tract infection)   GERD (gastroesophageal reflux disease)   Fibromyalgia   Peripheral neuropathy (H[facility])  Afib RVR: New-onset. Patient currently on diltiazem drip. - Management per primary team - cardiology  Colovesicular fistula: Chronic. At baseline. Patient with ongoing workup by [facility] with planned surgical intervention in the near future after being cleared from a cardiac standpoint. I have called [facility] and discussed this with the team, and they will evaluate pt while admitted and decide if additional testing or surgical intervention should occur during this admission.  - Mgt per [facility] - +/- Urology consult - defer to [facility]  UTI: Chronic and ongoing. Likely secondary to colovesicular fistula. Urine with obvious fecal matter in it. Symptomatic without evidence of pyelonephritis. Based on last culture and sensitivities with  resistant Escherichia coli we will treat with Augmentin. - UCX - Augmentin - pyridium  Depression/Anxiety: - Recommend pt to continue cymbalta, Xanax   Peripheral neuropathy / fibromyalgia: - recommend pt to continue neurontin  GERD: - recommend pt continue PPI   Thank you for this consultation.  Our [facility] hospitalist team will follow the patient with you.    Jonas Goh J M.D. [facility] 06/12/2016, 4:45 PM

## 2016-06-12 NOTE — ED Provider Notes (Signed)
[facility] DEPT Provider Note   CSN: [id number] Arrival date & time: 06/12/16  1219     History   Chief Complaint Chief Complaint  Patient presents with  . Shortness of Breath    HPI Sabrina [name] is a 70 y.o. female.  Pt presents to the ED today with new onset a.fib with RVR.  Pt has a suspected colovesicular vs colovaginal fistula, but no surgery scheduled as of yet.  She was seen at Dr. Tanna Furry office and saw PA Charlcie Cradle (cardiology) for medical clearance.  While there, it was noted that pt was tachycardic and irregular.  EKG confirmed a.fib with rvr.  The pt has never had a.fib in the past.  Pt declined EMS transport and was driven here by her boyfriend.  The pt can't feel that her heart beat is irregular.     I give her a CHADVAS score of 4 for age, sex, chf, and htn.      Past Medical History:  Diagnosis Date  . Allergy   . Anemia   . Anxiety   . Arthritis   . Cataract   . Chronic diastolic heart failure ([place])   . Collagen vascular disease ([place])   . Depression   . Fibromyalgia   . GERD (gastroesophageal reflux disease)   . Hyperlipidemia   . Neuropathy (HCC)    legs and feet  . Tubular adenoma of colon     Patient Active Problem List   Diagnosis Date Noted  . S/P gastric bypass 01/22/2016  . S/P splenectomy 01/22/2016  . S/P cholecystectomy 01/22/2016  . Hx of small bowel obstruction 01/22/2016  . Asthma 10/19/2015  . Generalized anxiety disorder 10/19/2015  . Depression 10/19/2015  . Insomnia 10/19/2015  . Iron deficiency anemia 10/19/2015  . GAD (generalized anxiety disorder) 02/01/2015  . Vitamin D deficiency 11/29/2014  . Eczema 11/27/2014  . Benign essential HTN 09/25/2014  . Morbid obesity ([place]) 09/25/2014  . Chronic diastolic heart failure ([place]) 09/25/2014  . Chest tightness or pressure 03/27/2014  . SOB (shortness of breath) 03/27/2014  . Tick bites 02/28/2013  . Allergic rhinitis 02/28/2013    Past Surgical History:  Procedure  Laterality Date  . ABDOMINAL HYSTERECTOMY    . APPENDECTOMY    . broken hip     left; metal  . CHOLECYSTECTOMY  1978  . GASTRIC BYPASS  2002  . SPLENECTOMY  2002  . TONSILLECTOMY  1963    OB History    No data available       Home Medications    Prior to Admission medications   Medication Sig Start Date End Date Taking? Authorizing Provider  albuterol (PROVENTIL HFA;VENTOLIN HFA) 108 (90 BASE) MCG/ACT inhaler Inhale 2 puffs into the lungs every 4 (four) hours as needed for wheezing or shortness of breath. Reported on 11/22/2015   Yes Historical Provider, MD  ALPRAZolam Duanne Moron) 0.5 MG tablet TAKE 1 TABLET BY MOUTH AT BEDTIME AS NEEDED FOR ANXIETY 10/22/15  Yes Sharion Balloon, FNP  amoxicillin-clavulanate (AUGMENTIN) 875-125 MG tablet Take 1 tablet by mouth 2 (two) times daily. 06/10/16 07/10/16 Yes Historical Provider, MD  DULoxetine (CYMBALTA) 60 MG capsule TAKE 1 CAPSULE (60 MG TOTAL) BY MOUTH DAILY. Patient taking differently: Take 60 mg by mouth at bedtime.  05/20/16  Yes Sharion Balloon, FNP  ferrous sulfate 325 (65 FE) MG tablet Take 325 mg by mouth 3 (three) times a week.   Yes Historical Provider, MD  fluticasone (FLONASE) 50 MCG/ACT nasal spray  Place 2 sprays into both nostrils daily. 11/20/14  Yes Collene Gobble, MD  furosemide (LASIX) 20 MG tablet TAKE 1 TABLET (20 MG TOTAL) BY MOUTH 2 (TWO) TIMES DAILY. 11/28/15  Yes Evans Lance, MD  gabapentin (NEURONTIN) 100 MG capsule Take 1 Tab for 1 day, then 1 tab twice a day, then 1 tab TID Patient taking differently: Take 100 mg by mouth 2 (two) times daily.  01/15/16  Yes Claretta Fraise, MD  halobetasol (ULTRAVATE) 0.05 % cream APPLY TOPICALLY 2 (TWO) TIMES DAILY. 10/22/15  Yes Sharion Balloon, FNP  omeprazole (PRILOSEC) 40 MG capsule Take 1 capsule (40 mg total) by mouth daily. Open capsule and sprinkle on pudding, apple sauce or something soft.  One every am on an empty stomach 20 -30 minutes prior to breakfast. 02/13/16  Yes Jerene Bears, MD  Vitamin D, Ergocalciferol, (DRISDOL) 50000 units CAPS capsule TAKE 1 CAPSULE (50,000 UNITS TOTAL) BY MOUTH EVERY 7 (SEVEN) DAYS. Patient taking differently: TAKE 1 CAPSULE (50,000 UNITS TOTAL) BY MOUTH EVERY 7 (SEVEN) DAYS. (Wednesday) 01/11/16  Yes Sharion Balloon, FNP  albuterol (PROVENTIL) (2.5 MG/3ML) 0.083% nebulizer solution Take 3 mLs (2.5 mg total) by nebulization every 6 (six) hours as needed for wheezing or shortness of breath. 05/19/14   Lysbeth Penner, FNP  [employer] IRON 325 (65 FE) MG tablet TAKE 1 TABLET (325 MG TOTAL) BY MOUTH DAILY WITH BREAKFAST. Patient not taking: Reported on 06/12/2016 02/21/15   Mary-Margaret Hassell Done, FNP    Family History Family History  Problem Relation Age of Onset  . Cancer Mother   . Alzheimer's disease Mother   . Arthritis Father   . Colon cancer Paternal Uncle 31    Social History Social History  Substance Use Topics  . Smoking status: Former Smoker    Quit date: 02/26/1993  . Smokeless tobacco: Never Used  . Alcohol use No     Allergies   Codeine   Review of Systems Review of Systems  Respiratory: Positive for shortness of breath.   Cardiovascular: Positive for palpitations.     Physical Exam Updated Vital Signs BP 111/85   Pulse 115   Resp 25   Ht 5\' 4"  (1.626 m)   Wt 186 lb (84.4 kg)   SpO2 97%   BMI 31.93 kg/m   Physical Exam  Constitutional: She is oriented to person, place, and time. She appears well-developed and well-nourished.  HENT:  Head: Normocephalic and atraumatic.  Right Ear: External ear normal.  Left Ear: External ear normal.  Nose: Nose normal.  Mouth/Throat: Oropharynx is clear and moist.  Eyes: Conjunctivae and EOM are normal. Pupils are equal, round, and reactive to light.  Neck: Normal range of motion. Neck supple.  Cardiovascular: Normal heart sounds and intact distal pulses.  An irregularly irregular rhythm present. Tachycardia present.   Pulmonary/Chest: Effort normal and breath sounds  normal.  Abdominal: Soft. Bowel sounds are normal.  Musculoskeletal: Normal range of motion.  Neurological: She is alert and oriented to person, place, and time.  Skin: Skin is warm and dry.  Psychiatric: She has a normal mood and affect. Her behavior is normal. Judgment and thought content normal.  Nursing note and vitals reviewed.    ED Treatments / Results  Labs (all labs ordered are listed, but only abnormal results are displayed) Labs Reviewed  CBC - Abnormal; Notable for the following:       Result Value   RBC 5.16 (*)    Hemoglobin  15.9 (*)    HCT 48.8 (*)    All other components within normal limits  COMPREHENSIVE METABOLIC PANEL - Abnormal; Notable for the following:    Glucose, Bld 110 (*)    All other components within normal limits  MAGNESIUM  TROPONIN I  TSH  URINALYSIS, ROUTINE W REFLEX MICROSCOPIC (NOT AT [facility])    EKG  EKG Interpretation  Date/Time:  Thursday June 12 2016 12:23:32 EDT Ventricular Rate:  154 PR Interval:    QRS Duration: 68 QT Interval:  304 QTC Calculation: 486 R Axis:   2 Text Interpretation:  Atrial fibrillation with rapid ventricular response Possible Anterior infarct , age undetermined Abnormal ECG Confirmed by [facility] MD, Aron Needles [phone]) on 06/12/2016 12:45:04 PM       Radiology Dg Chest Port 1 View  Result Date: 06/12/2016 CLINICAL DATA:  70 y/o F; atrial fibrillation and fluctuating blood pressure. EXAM: PORTABLE CHEST 1 VIEW COMPARISON:  03/19/2014 chest radiograph. FINDINGS: Stable cardiomediastinal silhouette within normal limits. Aortic atherosclerosis with calcification of the arch. Surgical clips project over left upper quadrant. Linear opacities at left lung base probably represent atelectasis or scarring and are similar to the prior study. No focal consolidation, pneumothorax, or pleural effusion. No acute osseous abnormality. IMPRESSION: No active disease. Electronically Signed   By: Kristine Garbe M.D.   On:  06/12/2016 13:36    Procedures .Critical Care Performed by: Isla Pence Authorized by: Isla Pence   Critical care provider statement:    Critical care time (minutes):  30   Critical care time was exclusive of:  Separately billable procedures and treating other patients and teaching time   Critical care was necessary to treat or prevent imminent or life-threatening deterioration of the following conditions:  Circulatory failure   Critical care was time spent personally by me on the following activities:  Development of treatment plan with patient or surrogate, discussions with consultants, evaluation of patient's response to treatment, examination of patient, ordering and review of laboratory studies, ordering and review of radiographic studies, pulse oximetry, re-evaluation of patient's condition and review of old charts    (including critical care time)  Medications Ordered in ED Medications  diltiazem (CARDIZEM) 1 mg/mL load via infusion 20 mg (20 mg Intravenous Bolus from Bag 06/12/16 1310)    And  diltiazem (CARDIZEM) 100 mg in dextrose 5 % 100 mL (1 mg/mL) infusion (5 mg/hr Intravenous Restarted 06/12/16 1534)  apixaban (ELIQUIS) tablet 5 mg (not administered)  acetaminophen (TYLENOL) tablet 650 mg (not administered)  sodium chloride 0.9 % bolus 1,000 mL (1,000 mLs Intravenous New Bag/Given 06/12/16 1312)     Initial Impression / Assessment and Plan / ED Course  I have reviewed the triage vital signs and the nursing notes.  Pertinent labs & imaging results that were available during my care of the patient were reviewed by me and considered in my medical decision making (see chart for details).  Clinical Course   Pt's rate improved from 150s to 80s with 20 mg of cardizem.  The pt was d/w PA Dunn who spoke with the office.  The office recommended that pt be observed overnight.  Cardiology requests that I speak with the hospitalists for admission.  At the time I spoke with  the hospitalists, her hr was better, and they thought that everything was chronic.  I spoke with PA Charlcie Cradle and Dr. Acie Fredrickson who did recommend admission, but they did not want to admit, they wanted the hospitalists to admit.   We  did decide to try metoprolol 25 bid and start pt on eliquis as she is not yet scheduled for surgery.  We got patient up to walk her and her heart rate increased to 120s.  BP stable.  I called cardiology back to see if they will admit.  They will admit, but request that the hospitalists consult.  I will speak with them.   Final Clinical Impressions(s) / ED Diagnoses   Final diagnoses:  Atrial fibrillation with RVR [facility])    New Prescriptions New Prescriptions   No medications on file     Isla Pence, MD 06/17/16 1209

## 2016-06-12 NOTE — Patient Instructions (Addendum)
YOU HAVE BEEN RECOMMENDED TO GO TO EMERGENCY ROOM FOR FURTHER EVALUATION PER PROVIDER PA-C RENEE URSUY

## 2016-06-13 ENCOUNTER — Encounter (HOSPITAL_COMMUNITY): Payer: Self-pay

## 2016-06-13 ENCOUNTER — Observation Stay (HOSPITAL_BASED_OUTPATIENT_CLINIC_OR_DEPARTMENT_OTHER): Payer: Commercial Managed Care - HMO

## 2016-06-13 DIAGNOSIS — I5032 Chronic diastolic (congestive) heart failure: Secondary | ICD-10-CM | POA: Diagnosis not present

## 2016-06-13 DIAGNOSIS — I4891 Unspecified atrial fibrillation: Secondary | ICD-10-CM | POA: Diagnosis not present

## 2016-06-13 LAB — BASIC METABOLIC PANEL
ANION GAP: 8 (ref 5–15)
BUN: 13 mg/dL (ref 6–20)
CHLORIDE: 105 mmol/L (ref 101–111)
CO2: 27 mmol/L (ref 22–32)
CREATININE: 0.94 mg/dL (ref 0.44–1.00)
Calcium: 8.7 mg/dL — ABNORMAL LOW (ref 8.9–10.3)
GFR calc non Af Amer: >60 mL/min (ref >60)
Glucose, Bld: 93 mg/dL (ref 65–99)
Potassium: 4 mmol/L (ref 3.5–5.1)
SODIUM: 140 mmol/L (ref 135–145)

## 2016-06-13 LAB — ECHOCARDIOGRAM COMPLETE
Height: 64 in
WEIGHTICAEL: 3006.4 [oz_av]

## 2016-06-13 LAB — CBC
HEMATOCRIT: 42.1 % (ref 36.0–46.0)
HEMOGLOBIN: 13.2 g/dL (ref 12.0–15.0)
MCH: 30 pg (ref 26.0–34.0)
MCHC: 31.4 g/dL (ref 30.0–36.0)
MCV: 95.7 fL (ref 78.0–100.0)
Platelets: 343 10*3/uL (ref 150–400)
RBC: 4.4 MIL/uL (ref 3.87–5.11)
RDW: 12.7 % (ref 11.5–15.5)
WBC: 7 10*3/uL (ref 4.0–10.5)

## 2016-06-13 LAB — PROTIME-INR
INR: 1.24
PROTHROMBIN TIME: 15.7 s — AB (ref 11.4–15.2)

## 2016-06-13 MED ORDER — INFLUENZA VAC SPLIT QUAD 0.5 ML IM SUSY
0.5000 mL | PREFILLED_SYRINGE | INTRAMUSCULAR | Status: AC
  Administered 2016-06-14: 0.5 mL via INTRAMUSCULAR

## 2016-06-13 MED ORDER — DILTIAZEM HCL-DEXTROSE 100-5 MG/100ML-% IV SOLN (PREMIX)
5.0000 mg/h | INTRAVENOUS | Status: DC
  Administered 2016-06-13: 5 mg/h via INTRAVENOUS

## 2016-06-13 MED ORDER — DILTIAZEM HCL ER COATED BEADS 180 MG PO CP24
180.0000 mg | ORAL_CAPSULE | Freq: Every day | ORAL | Status: DC
  Administered 2016-06-13 – 2016-06-17 (×5): 180 mg via ORAL
  Filled 2016-06-13 (×5): qty 1

## 2016-06-13 NOTE — Progress Notes (Signed)
Patient Name: Sabrina [name] Date of Encounter: 06/13/2016  Primary Cardiologist: Dr [facility] Problem List     Principal Problem:   Atrial fibrillation with RVR [facility]) Active Problems:   Chronic diastolic heart failure (HCC)   Asthma   Generalized anxiety disorder   Depression   Colovesical fistula   UTI (lower urinary tract infection)   GERD (gastroesophageal reflux disease)   Fibromyalgia   Peripheral neuropathy (HCC)   Atrial fibrillation ([place])     Subjective   Did poorly with ambulation, HR very high (140s) and SBP 90s. IV Cardizem overlapped po by an hour. Feels better in the chair, HR 90s, SBP 90s. Does not feel safe going home like this.  Inpatient Medications    Scheduled Meds: . amoxicillin-clavulanate  1 tablet Oral BID  . apixaban  5 mg Oral BID  . diltiazem  180 mg Oral Daily  . DULoxetine  60 mg Oral QHS  . ferrous sulfate  325 mg Oral Once per day on Mon Wed Fri  . furosemide  20 mg Oral BID  . gabapentin  100 mg Oral BID  . [START ON 06/14/2016] Influenza vac split quadrivalent PF  0.5 mL Intramuscular Tomorrow-1000  . pantoprazole  40 mg Oral Daily   Continuous Infusions:  PRN Meds:.acetaminophen, albuterol, ALPRAZolam, ondansetron (ZOFRAN) IV   Vital Signs    Vitals:   06/13/16 0305 06/13/16 0505 06/13/16 0550 06/13/16 0605  BP: 102/76 90/78 96/75  112/88  Pulse:   (!) 56   Resp:   16   Temp:   97.4 F (36.3 C)   TempSrc:   Oral   SpO2:   95%   Weight:   187 lb 14.4 oz (85.2 kg)   Height:        Intake/Output Summary (Last 24 hours) at 06/13/16 0955 Last data filed at 06/13/16 0500  Gross per 24 hour  Intake          1112.87 ml  Output                0 ml  Net          1112.87 ml   Filed Weights   06/12/16 1251 06/12/16 2100 06/13/16 0550  Weight: 186 lb (84.4 kg) 187 lb 14.4 oz (85.2 kg) 187 lb 14.4 oz (85.2 kg)    Physical Exam    GEN: Well nourished, well developed, in no acute distress.  HEENT: Grossly normal.    Neck: Supple, no JVD, carotid bruits, or masses. Cardiac: Irreg R&R, no murmurs, rubs, or gallops. No clubbing, cyanosis, edema.  Radials/DP/PT 2+ and equal bilaterally.  Respiratory:  Respirations regular and unlabored, few rales bases bilaterally. GI: Soft, nontender, nondistended, BS + x 4. MS: no deformity or atrophy. Skin: warm and dry, no rash. Neuro:  Strength and sensation are intact. Psych: AAOx3.  Normal affect.  Labs    CBC  Recent Labs  06/12/16 2200 06/13/16 0516  WBC 8.0 7.0  NEUTROABS 4.5  --   HGB 13.6 13.2  HCT 42.8 42.1  MCV 94.9 95.7  PLT 352 [id number]   Basic Metabolic Panel  Recent Labs  06/12/16 1235 06/12/16 2200 06/13/16 0516  NA 141 137 140  K 4.0 3.5 4.0  CL 104 105 105  CO2 26 25 27   GLUCOSE 110* 223* 93  BUN 16 15 13   CREATININE 0.94 0.95 0.94  CALCIUM 9.3 8.7* 8.7*  MG 2.1  --   --    Liver  Function Tests  Recent Labs  06/12/16 1235  AST 26  ALT 17  ALKPHOS 103  BILITOT 1.0  PROT 6.8  ALBUMIN 3.7   Cardiac Enzymes  Recent Labs  06/12/16 1235  TROPONINI <0.03   Thyroid Function Tests  Recent Labs  06/12/16 2200  TSH 7.795*    Telemetry    Atrial fib, mostly RVR - Personally Reviewed  ECG    09/21, atrial fib, RVR, no acute - Personally Reviewed  Radiology    Dg Chest Port 1 View Result Date: 06/12/2016 CLINICAL DATA:  70 y/o F; atrial fibrillation and fluctuating blood pressure. EXAM: PORTABLE CHEST 1 VIEW COMPARISON:  03/19/2014 chest radiograph. FINDINGS: Stable cardiomediastinal silhouette within normal limits. Aortic atherosclerosis with calcification of the arch. Surgical clips project over left upper quadrant. Linear opacities at left lung base probably represent atelectasis or scarring and are similar to the prior study. No focal consolidation, pneumothorax, or pleural effusion. No acute osseous abnormality. IMPRESSION: No active disease. Electronically Signed   By: Kristine Garbe M.D.   On:  06/12/2016 13:36     Cardiac Studies   Echo ordered  Patient Profile     70 y.o.femalewith history of gastric bypass w/ complications, splenic injury requiring splenectomy, subsequent adhesions, diverticulitis, tubular adenoma colon, iron and B12 deficiency, anxiety/depression, fibromyalgia, COPD, recurrent UTI's, HLD,and diastolic CHF. Found with a colonoicvaginal fistula requiring surgery. At o.v. For cardiac pre-operative evaluation, pt in rapid a fib and admitted 09/21. IM consult for management of general medical issues.  Assessment & Plan    Principal Problem:   Atrial fibrillation with RVR (HCC) - new dx - BP has been running 90s, HR controlled at rest, but pt gets SOB when up and around - had Cardizem CD 180 mg this am, follow sx and HR/BP - if Cardizem not effective, may need to add dig or increase Cardizem as BP will allow - BB not first choice w/ hx asthma - TSH slightly elevated, ck free T4    Anticoagulation - CHA2DS2VASc=3 (female, age x 1, HTN) - tolerating Eliquis  Active Problems:   Chronic diastolic heart failure (HCC) - volume status good, encourage deep inspiration    Asthma - no wheezing now  Otherwise, per IM   Generalized anxiety disorder   Depression   Colovesical fistula   UTI (lower urinary tract infection)   GERD (gastroesophageal reflux disease)   Fibromyalgia   Peripheral neuropathy ([place])  Plan - d/c when medically stable.  Signed, Rosaria Ferries, PA-C  06/13/2016, 9:55 AM   Personally seen and examined. Agree with above.  TEE/CV on Monday. Has been SOB for 2 months BP chronically low at home (90's) ? If worsening SOB correlates with AFIB Will give opportunity at NSR. Continue po dilt 180 CD Stop lasix Continue eliquis  Candee Furbish, MD

## 2016-06-13 NOTE — Progress Notes (Signed)
  Echocardiogram 2D Echocardiogram has been performed.  Sabrina [name] 06/13/2016, 11:41 AM

## 2016-06-13 NOTE — Progress Notes (Signed)
[facility] PROGRESS NOTE                                                                                                                                                                                                             Patient Demographics:    Sabrina [name], is a 70 y.o. female, DOB - Jun 17, 1946, [id number]  Admit date - 06/12/2016   Admitting Physician Thayer Headings, MD  Outpatient Primary MD for the patient is Evelina Dun, FNP  LOS - 0  Chief Complaint  Patient presents with  . Shortness of Breath       Brief Narrative    Sabrina [name] is an 70 y.o. female anemia, anxiety, depression, fibromyalgia, diastolic CHF, GERD, HLD, peripheral neuropathy, colovesicular fistula presenting w/ Afib w/ RVR. Patient came to [facility] after evaluation by cardiology on the same day as admission. Patient was being seen by cardiology for preop cardiac clearance requested by [facility] for surgical intervention for colovesicular fistula. While at her cardiology appointment patient developed palpitations and shortness of breath. EKG at her appointment confirmed that she was in A. fib with RVR. Patient declined EMS transport to the ED and came by private vehicle. Patient denies any active chest pain, LOC, dizziness/vertigo, fevers, chest pain, confusion. Patient does endorse chronic persistent left lower quadrant pain for which she is undergoing workup and is felt to be due to her colovesicular fistula. Patient also endorses fecal sediment in her urine which has gotten worse over the last several days. She also endorses worsening suprapubic tenderness. Denies any flank pain or fevers.  Of note patient also states that she feels that her Lasix is no longer working as she now takes the medicine without any increased diuresis. Patient also endorses a 15 pound weight gain over the last 3 months that she feels is predominately due to fluid  retention.    Subjective:    Sabrina [name] today has, No headache, No chest pain, No abdominal pain - No Nausea, No new weakness tingling or numbness, No Cough - SOB.     Assessment  & Plan :    UTI: Acute on Chronic Likely secondary to colovesicular fistula.She has been placed on Augmentin with good clinical benefit based on previous cultures, follow recent cultures, clinically better will give a 5-7 day course.  Afib RVR: New-onset.  Management per primary team - cardiology  Colovesicular fistula: Chronic. This happened after her gastric bypass surgery in [facility], general surgery following, defer management to Gen. surgery.  Depression/Anxiety: Recommend pt to continue cymbalta, Xanax   Peripheral neuropathy / fibromyalgia: recommend pt to continue neurontin  GERD: recommend pt continue PPI      DVT Prophylaxis  :  Eliquis  Lab Results  Component Value Date   PLT 343 06/13/2016    Inpatient Medications  Scheduled Meds: . amoxicillin-clavulanate  1 tablet Oral BID  . apixaban  5 mg Oral BID  . diltiazem  180 mg Oral Daily  . DULoxetine  60 mg Oral QHS  . ferrous sulfate  325 mg Oral Once per day on Mon Wed Fri  . gabapentin  100 mg Oral BID  . [START ON 06/14/2016] Influenza vac split quadrivalent PF  0.5 mL Intramuscular Tomorrow-1000  . pantoprazole  40 mg Oral Daily   Continuous Infusions:  PRN Meds:.acetaminophen, albuterol, ALPRAZolam, ondansetron (ZOFRAN) IV  Antibiotics  :    Anti-infectives    Start     Dose/Rate Route Frequency Ordered Stop   06/12/16 2200  amoxicillin-clavulanate (AUGMENTIN) 875-125 MG per tablet 1 tablet     1 tablet Oral 2 times daily 06/12/16 2059           Objective:   Vitals:   06/13/16 0305 06/13/16 0505 06/13/16 0550 06/13/16 0605  BP: 102/76 90/78 96/75  112/88  Pulse:   (!) 56   Resp:   16   Temp:   97.4 F (36.3 C)   TempSrc:   Oral   SpO2:   95%   Weight:   85.2 kg (187 lb 14.4 oz)   Height:          Wt Readings from Last 3 Encounters:  06/13/16 85.2 kg (187 lb 14.4 oz)  06/12/16 84.4 kg (186 lb)  04/21/16 84 kg (185 lb 3.2 oz)     Intake/Output Summary (Last 24 hours) at 06/13/16 1037 Last data filed at 06/13/16 0500  Gross per 24 hour  Intake          1112.87 ml  Output                0 ml  Net          1112.87 ml     Physical Exam  Awake Alert, Oriented X 3, No new F.N deficits, Normal affect Sabrina [name],PERRAL Supple Neck,No JVD, No cervical lymphadenopathy appriciated.  Symmetrical Chest wall movement, Good air movement bilaterally, CTAB iRRR,No Gallops,Rubs or new Murmurs, No Parasternal Heave +ve B.Sounds, Abd Soft, No tenderness, No organomegaly appriciated, No rebound - guarding or rigidity. No Cyanosis, Clubbing or edema, No new Rash or bruise       Data Review:    CBC  Recent Labs Lab 06/12/16 1235 06/12/16 2200 06/13/16 0516  WBC 9.0 8.0 7.0  HGB 15.9* 13.6 13.2  HCT 48.8* 42.8 42.1  PLT 400 352 343  MCV 94.6 94.9 95.7  MCH 30.8 30.2 30.0  MCHC 32.6 31.8 31.4  RDW 12.5 12.7 12.7  LYMPHSABS  --  2.8  --   MONOABS  --  0.3  --   EOSABS  --  0.2  --   BASOSABS  --  0.1  --     Chemistries   Recent Labs Lab 06/12/16 1235 06/12/16 2200 06/13/16 0516  NA 141 137 140  K 4.0 3.5 4.0  CL 104 105 105  CO2 26 25 27   GLUCOSE 110*  223* 93  BUN 16 15 13   CREATININE 0.94 0.95 0.94  CALCIUM 9.3 8.7* 8.7*  MG 2.1  --   --   AST 26  --   --   ALT 17  --   --   ALKPHOS 103  --   --   BILITOT 1.0  --   --    ------------------------------------------------------------------------------------------------------------------ No results for input(s): CHOL, HDL, LDLCALC, TRIG, CHOLHDL, LDLDIRECT in the last 72 hours.  Lab Results  Component Value Date   HGBA1C 5.4 11/27/2014   ------------------------------------------------------------------------------------------------------------------  Recent Labs  06/12/16 2200  TSH 7.795*    ------------------------------------------------------------------------------------------------------------------ No results for input(s): VITAMINB12, FOLATE, FERRITIN, TIBC, IRON, RETICCTPCT in the last 72 hours.  Coagulation profile  Recent Labs Lab 06/13/16 0516  INR 1.24    No results for input(s): DDIMER in the last 72 hours.  Cardiac Enzymes  Recent Labs Lab 06/12/16 1235  TROPONINI <0.03   ------------------------------------------------------------------------------------------------------------------ No results found for: BNP  Micro Results No results found for this or any previous visit (from the past 240 hour(s)).  Radiology Reports Ct Pelvis W Contrast  Result Date: 05/30/2016 CLINICAL DATA:  Evaluate for rectal fistula. Feces in urine. Patient passes a year from her vagina. Fall vaginal discharge and pelvic pain EXAM: CT PELVIS WITH CONTRAST TECHNIQUE: Multidetector CT imaging of the pelvis was performed using the standard protocol following the bolus administration of intravenous contrast. Rectal contrast also administered. CONTRAST:  13mL ISOVUE-300 IOPAMIDOL (ISOVUE-300) INJECTION 61% COMPARISON:  CT abdomen pelvis 01/24/2016 FINDINGS: Urinary Tract: Tiny air pocket is present in anticipated location of the urethra. Urinary bladder is only filled with small amount of urine. Bladder is otherwise unremarkable. No gas collections are present within the bladder. Distal ureters unremarkable. Bowel: Numerous diverticula are present involving the descending and sigmoid colon. No significant bowel wall thickening or pericolonic edema to suggest acute inflammation. No dilated distal small bowel. Vascular/Lymphatic: Scattered atherosclerotic calcifications in the distal aorta and iliac vessels. Reproductive: The uterus is surgically absent. There is a small amount of gas present within the vaginal vault. On the axial views, series 3., image number 36, there is thin crescent of  slight increased density suggesting contrast within the vagina. Linear gas collection extends anteriorly from the left anterior aspect of the rectum towards the posterior wall of the vagina, series 3., image number 42. Findings could relate to rectal vaginal fistula. Other: No significant free pelvic fluid. Isolated surgical clips present in the right hemi abdomen. Musculoskeletal: There is moderate-to-marked disc space narrowing at L4-L5 and L5-S1 with vacuum discs and osteophytes. IMPRESSION: 1. Small pockets of gas within the vagina with trace amount of contrast present in the vaginal vault. Small linear gas collection extending from the left anterior aspect of the rectum towards the posterior wall of the vagina ; collective findings are suspicious for rectovaginal fistula. 2. Numerous colon diverticula without CT evidence for acute diverticulitis. 3. Atherosclerotic vascular calcifications of the aorta and iliac vessels. Electronically Signed   By: Donavan Foil M.D.   On: 05/30/2016 17:02   Dg Chest Port 1 View  Result Date: 06/12/2016 CLINICAL DATA:  70 y/o F; atrial fibrillation and fluctuating blood pressure. EXAM: PORTABLE CHEST 1 VIEW COMPARISON:  03/19/2014 chest radiograph. FINDINGS: Stable cardiomediastinal silhouette within normal limits. Aortic atherosclerosis with calcification of the arch. Surgical clips project over left upper quadrant. Linear opacities at left lung base probably represent atelectasis or scarring and are similar to the prior study. No focal consolidation,  pneumothorax, or pleural effusion. No acute osseous abnormality. IMPRESSION: No active disease. Electronically Signed   By: Kristine Garbe M.D.   On: 06/12/2016 13:36    Time Spent in minutes  30   Adrea Sherpa K M.D on 06/13/2016 at 10:37 AM  Between 7am to 7pm - Pager - [phone]  After 7pm go to [email or web address] - password [facility]  [facility] -  Office  [phone]

## 2016-06-13 NOTE — Care Management Obs Status (Signed)
[facility] NOTIFICATION   Patient Details  Name: DELROSE MORRISSEY MRN: [id number] Date of Birth: [birth year]   Medicare Observation Status Notification Given:  Yes    Bethena Roys, RN 06/13/2016, 4:33 PM

## 2016-06-13 NOTE — Progress Notes (Signed)
Pt given off the beat book as well as education regarding Eliquis.

## 2016-06-13 NOTE — Care Management (Signed)
S/W KAYLA @ [facility] # [phone]   ELIQUIS 2.5 MG  BID  ( 30 )   AND     5 MG BID  COVER- YES                          YES  CO-PAY- $ 3.70                        SAME  TIER- 3 DRUG                         SAME  PRIOR APPROVAL- NO                 SAME  PHARMACY : [facility]  MAIL-ORDER FOR 90 DAY SUPPLY  $ 3.70   PATIENT ALSO HAS MEDICAID  CO-PAY - $ 3.70   Eliquis card provided to patient and she is aware of co pay. No further needs from CM at this time. Bethena Roys, RN,BSN [phone]

## 2016-06-14 DIAGNOSIS — I110 Hypertensive heart disease with heart failure: Secondary | ICD-10-CM | POA: Diagnosis present

## 2016-06-14 DIAGNOSIS — J449 Chronic obstructive pulmonary disease, unspecified: Secondary | ICD-10-CM | POA: Diagnosis present

## 2016-06-14 DIAGNOSIS — I5032 Chronic diastolic (congestive) heart failure: Secondary | ICD-10-CM | POA: Diagnosis present

## 2016-06-14 DIAGNOSIS — R079 Chest pain, unspecified: Secondary | ICD-10-CM | POA: Diagnosis not present

## 2016-06-14 DIAGNOSIS — K219 Gastro-esophageal reflux disease without esophagitis: Secondary | ICD-10-CM | POA: Diagnosis present

## 2016-06-14 DIAGNOSIS — B9620 Unspecified Escherichia coli [E. coli] as the cause of diseases classified elsewhere: Secondary | ICD-10-CM | POA: Diagnosis present

## 2016-06-14 DIAGNOSIS — Z8249 Family history of ischemic heart disease and other diseases of the circulatory system: Secondary | ICD-10-CM | POA: Diagnosis not present

## 2016-06-14 DIAGNOSIS — Z23 Encounter for immunization: Secondary | ICD-10-CM | POA: Diagnosis not present

## 2016-06-14 DIAGNOSIS — I959 Hypotension, unspecified: Secondary | ICD-10-CM | POA: Diagnosis not present

## 2016-06-14 DIAGNOSIS — N321 Vesicointestinal fistula: Secondary | ICD-10-CM | POA: Diagnosis present

## 2016-06-14 DIAGNOSIS — F329 Major depressive disorder, single episode, unspecified: Secondary | ICD-10-CM | POA: Diagnosis present

## 2016-06-14 DIAGNOSIS — Z823 Family history of stroke: Secondary | ICD-10-CM | POA: Diagnosis not present

## 2016-06-14 DIAGNOSIS — N390 Urinary tract infection, site not specified: Secondary | ICD-10-CM | POA: Diagnosis present

## 2016-06-14 DIAGNOSIS — I4891 Unspecified atrial fibrillation: Secondary | ICD-10-CM | POA: Diagnosis present

## 2016-06-14 DIAGNOSIS — M797 Fibromyalgia: Secondary | ICD-10-CM | POA: Diagnosis present

## 2016-06-14 DIAGNOSIS — Z800 Family history of malignant neoplasm of digestive organs: Secondary | ICD-10-CM | POA: Diagnosis not present

## 2016-06-14 DIAGNOSIS — G629 Polyneuropathy, unspecified: Secondary | ICD-10-CM | POA: Diagnosis present

## 2016-06-14 DIAGNOSIS — Z8261 Family history of arthritis: Secondary | ICD-10-CM | POA: Diagnosis not present

## 2016-06-14 DIAGNOSIS — R0602 Shortness of breath: Secondary | ICD-10-CM | POA: Diagnosis present

## 2016-06-14 DIAGNOSIS — Z9884 Bariatric surgery status: Secondary | ICD-10-CM | POA: Diagnosis not present

## 2016-06-14 DIAGNOSIS — Z6832 Body mass index (BMI) 32.0-32.9, adult: Secondary | ICD-10-CM | POA: Diagnosis not present

## 2016-06-14 DIAGNOSIS — Z87891 Personal history of nicotine dependence: Secondary | ICD-10-CM | POA: Diagnosis not present

## 2016-06-14 DIAGNOSIS — E785 Hyperlipidemia, unspecified: Secondary | ICD-10-CM | POA: Diagnosis present

## 2016-06-14 DIAGNOSIS — K5792 Diverticulitis of intestine, part unspecified, without perforation or abscess without bleeding: Secondary | ICD-10-CM | POA: Diagnosis present

## 2016-06-14 DIAGNOSIS — R0789 Other chest pain: Secondary | ICD-10-CM | POA: Diagnosis not present

## 2016-06-14 DIAGNOSIS — F411 Generalized anxiety disorder: Secondary | ICD-10-CM | POA: Diagnosis present

## 2016-06-14 LAB — URINE CULTURE

## 2016-06-14 LAB — T4, FREE: FREE T4: 0.77 ng/dL (ref 0.61–1.12)

## 2016-06-14 MED ORDER — AMOXICILLIN-POT CLAVULANATE 875-125 MG PO TABS: 1.0000 | ORAL_TABLET | Freq: Two times a day (BID) | ORAL | Status: DC

## 2016-06-14 MED ORDER — PNEUMOCOCCAL VAC POLYVALENT 25 MCG/0.5ML IJ INJ
0.5000 mL | INJECTION | Freq: Once | INTRAMUSCULAR | Status: AC
  Administered 2016-06-14: 0.5 mL via INTRAMUSCULAR
  Filled 2016-06-14: qty 0.5

## 2016-06-14 MED ORDER — MAGNESIUM HYDROXIDE 400 MG/5ML PO SUSP
15.0000 mL | Freq: Once | ORAL | Status: AC
  Administered 2016-06-14: 15 mL via ORAL
  Filled 2016-06-14: qty 30

## 2016-06-14 NOTE — Progress Notes (Signed)
Pt ambulated around nurses station x3.  Pt tolerated fairly with increased HR in the 140's and increased shortness of breath. No O2 therapy during ambulation.

## 2016-06-14 NOTE — Progress Notes (Signed)
[facility] PROGRESS NOTE                                                                                                                                                                                                             Patient Demographics:    Sabrina [name], is a 70 y.o. female, DOB - 1946-05-31, [id number]  Admit date - 06/12/2016   Admitting Physician Thayer Headings, MD  Outpatient Primary MD for the patient is Evelina Dun, FNP  LOS - 0  Chief Complaint  Patient presents with  . Shortness of Breath       Brief Narrative    KIELY BRIGNONI is an 70 y.o. female anemia, anxiety, depression, fibromyalgia, diastolic CHF, GERD, HLD, peripheral neuropathy, colovesicular fistula presenting w/ Afib w/ RVR. Patient came to [facility] after evaluation by cardiology on the same day as admission. Patient was being seen by cardiology for preop cardiac clearance requested by [facility] for surgical intervention for colovesicular fistula. While at her cardiology appointment patient developed palpitations and shortness of breath. EKG at her appointment confirmed that she was in A. fib with RVR. Patient declined EMS transport to the ED and came by private vehicle. Patient denies any active chest pain, LOC, dizziness/vertigo, fevers, chest pain, confusion. Patient does endorse chronic persistent left lower quadrant pain for which she is undergoing workup and is felt to be due to her colovesicular fistula. Patient also endorses fecal sediment in her urine which has gotten worse over the last several days. She also endorses worsening suprapubic tenderness. Denies any flank pain or fevers.  Of note patient also states that she feels that her Lasix is no longer working as she now takes the medicine without any increased diuresis. Patient also endorses a 15 pound weight gain over the last 3 months that she feels is predominately due to fluid  retention.    Subjective:    [name] today has, No headache, No chest pain, No abdominal pain - No Nausea, No new weakness tingling or numbness, No Cough - SOB.     Assessment  & Plan :    UTI: Acute on Chronic After detailed history this most likely is colonization due to her colovesicular fistula, cultures noted, at this time. Antibiotics, will not treat with antibiotics unless she has dysuria, fever or leukocytosis in the future. No further antibiotics at this time.  Afib RVR: New-onset.  Management per primary team - cardiology  Colovesicular fistula: Chronic. This happened after her gastric bypass surgery in [facility], general surgery following, defer management to Gen. surgery.  Depression/Anxiety: Recommend pt to continue cymbalta, Xanax   Peripheral neuropathy / fibromyalgia: recommend pt to continue neurontin  GERD: recommend pt continue PPI      DVT Prophylaxis  :  Eliquis  Lab Results  Component Value Date   PLT 343 06/13/2016    Inpatient Medications  Scheduled Meds: . apixaban  5 mg Oral BID  . diltiazem  180 mg Oral Daily  . DULoxetine  60 mg Oral QHS  . ferrous sulfate  325 mg Oral Once per day on Mon Wed Fri  . gabapentin  100 mg Oral BID  . Influenza vac split quadrivalent PF  0.5 mL Intramuscular Tomorrow-1000  . pantoprazole  40 mg Oral Daily  . pneumococcal 23 valent vaccine  0.5 mL Intramuscular Once   Continuous Infusions:  PRN Meds:.acetaminophen, albuterol, ALPRAZolam, ondansetron (ZOFRAN) IV  Antibiotics  :    Anti-infectives    Start     Dose/Rate Route Frequency Ordered Stop   06/14/16 2200  amoxicillin-clavulanate (AUGMENTIN) 875-125 MG per tablet 1 tablet  Status:  Discontinued     1 tablet Oral 2 times daily 06/14/16 0908 06/14/16 0909   06/12/16 2200  amoxicillin-clavulanate (AUGMENTIN) 875-125 MG per tablet 1 tablet  Status:  Discontinued     1 tablet Oral 2 times daily 06/12/16 2059 06/14/16 0908          Objective:   Vitals:   06/13/16 1622 06/13/16 2030 06/14/16 0500 06/14/16 0800  BP:  104/66 98/72 (!) 90/58  Pulse: 84 (!) 107 90 (!) 108  Resp:  19 17   Temp: 98.3 F (36.8 C) 98.5 F (36.9 C) 97.5 F (36.4 C)   TempSrc: Oral     SpO2: 95% 96% 97%   Weight:   84.7 kg (186 lb 11.2 oz)   Height:        Wt Readings from Last 3 Encounters:  06/14/16 84.7 kg (186 lb 11.2 oz)  06/12/16 84.4 kg (186 lb)  04/21/16 84 kg (185 lb 3.2 oz)     Intake/Output Summary (Last 24 hours) at 06/14/16 0928 Last data filed at 06/14/16 0800  Gross per 24 hour  Intake              720 ml  Output                0 ml  Net              720 ml     Physical Exam  Awake Alert, Oriented X 3, No new F.N deficits, Normal affect [place].AT,PERRAL Supple Neck,No JVD, No cervical lymphadenopathy appriciated.  Symmetrical Chest wall movement, Good air movement bilaterally, CTAB iRRR,No Gallops,Rubs or new Murmurs, No Parasternal Heave +ve B.Sounds, Abd Soft, No tenderness, No organomegaly appriciated, No rebound - guarding or rigidity. No Cyanosis, Clubbing or edema, No new Rash or bruise       Data Review:    CBC  Recent Labs Lab 06/12/16 1235 06/12/16 2200 06/13/16 0516  WBC 9.0 8.0 7.0  HGB 15.9* 13.6 13.2  HCT 48.8* 42.8 42.1  PLT 400 352 343  MCV 94.6 94.9 95.7  MCH 30.8 30.2 30.0  MCHC 32.6 31.8 31.4  RDW 12.5 12.7 12.7  LYMPHSABS  --  2.8  --   MONOABS  --  0.3  --   EOSABS  --  0.2  --   BASOSABS  --  0.1  --     Chemistries   Recent Labs Lab 06/12/16 1235 06/12/16 2200 06/13/16 0516  NA 141 137 140  K 4.0 3.5 4.0  CL 104 105 105  CO2 26 25 27   GLUCOSE 110* 223* 93  BUN 16 15 13   CREATININE 0.94 0.95 0.94  CALCIUM 9.3 8.7* 8.7*  MG 2.1  --   --   AST 26  --   --   ALT 17  --   --   ALKPHOS 103  --   --   BILITOT 1.0  --   --    ------------------------------------------------------------------------------------------------------------------ No results for  input(s): CHOL, HDL, LDLCALC, TRIG, CHOLHDL, LDLDIRECT in the last 72 hours.  Lab Results  Component Value Date   HGBA1C 5.4 11/27/2014   ------------------------------------------------------------------------------------------------------------------  Recent Labs  06/12/16 2200  TSH 7.795*   ------------------------------------------------------------------------------------------------------------------ No results for input(s): VITAMINB12, FOLATE, FERRITIN, TIBC, IRON, RETICCTPCT in the last 72 hours.  Coagulation profile  Recent Labs Lab 06/13/16 0516  INR 1.24    No results for input(s): DDIMER in the last 72 hours.  Cardiac Enzymes  Recent Labs Lab 06/12/16 1235  TROPONINI <0.03   ------------------------------------------------------------------------------------------------------------------ No results found for: BNP  Micro Results Recent Results (from the past 240 hour(s))  Urine culture     Status: Abnormal   Collection Time: 06/12/16  4:30 PM  Result Value Ref Range Status   Specimen Description URINE, RANDOM  Final   Special Requests NONE  Final   Culture (A)  Final    20,000 COLONIES/mL ESCHERICHIA COLI Confirmed Extended Spectrum Beta-Lactamase Producer (ESBL)    Report Status 06/14/2016 FINAL  Final   Organism ID, Bacteria ESCHERICHIA COLI (A)  Final      Susceptibility   Escherichia coli - MIC*    AMPICILLIN >=32 RESISTANT Resistant     CEFAZOLIN >=64 RESISTANT Resistant     CEFTRIAXONE >=64 RESISTANT Resistant     CIPROFLOXACIN >=4 RESISTANT Resistant     GENTAMICIN <=1 SENSITIVE Sensitive     IMIPENEM <=0.25 SENSITIVE Sensitive     NITROFURANTOIN <=16 SENSITIVE Sensitive     TRIMETH/SULFA >=320 RESISTANT Resistant     AMPICILLIN/SULBACTAM 16 INTERMEDIATE Intermediate     PIP/TAZO <=4 SENSITIVE Sensitive     Extended ESBL POSITIVE Resistant     * 20,000 COLONIES/mL ESCHERICHIA COLI    Radiology Reports Ct Pelvis W Contrast  Result  Date: 05/30/2016 CLINICAL DATA:  Evaluate for rectal fistula. Feces in urine. Patient passes a year from her vagina. Fall vaginal discharge and pelvic pain EXAM: CT PELVIS WITH CONTRAST TECHNIQUE: Multidetector CT imaging of the pelvis was performed using the standard protocol following the bolus administration of intravenous contrast. Rectal contrast also administered. CONTRAST:  154mL ISOVUE-300 IOPAMIDOL (ISOVUE-300) INJECTION 61% COMPARISON:  CT abdomen pelvis 01/24/2016 FINDINGS: Urinary Tract: Tiny air pocket is present in anticipated location of the urethra. Urinary bladder is only filled with small amount of urine. Bladder is otherwise unremarkable. No gas collections are present within the bladder. Distal ureters unremarkable. Bowel: Numerous diverticula are present involving the descending and sigmoid colon. No significant bowel wall thickening or pericolonic edema to suggest acute inflammation. No dilated distal small bowel. Vascular/Lymphatic: Scattered atherosclerotic calcifications in the distal aorta and iliac vessels. Reproductive: The uterus is surgically absent. There is a small amount of gas present within the vaginal vault. On the  axial views, series 3., image number 36, there is thin crescent of slight increased density suggesting contrast within the vagina. Linear gas collection extends anteriorly from the left anterior aspect of the rectum towards the posterior wall of the vagina, series 3., image number 42. Findings could relate to rectal vaginal fistula. Other: No significant free pelvic fluid. Isolated surgical clips present in the right hemi abdomen. Musculoskeletal: There is moderate-to-marked disc space narrowing at L4-L5 and L5-S1 with vacuum discs and osteophytes. IMPRESSION: 1. Small pockets of gas within the vagina with trace amount of contrast present in the vaginal vault. Small linear gas collection extending from the left anterior aspect of the rectum towards the posterior wall of  the vagina ; collective findings are suspicious for rectovaginal fistula. 2. Numerous colon diverticula without CT evidence for acute diverticulitis. 3. Atherosclerotic vascular calcifications of the aorta and iliac vessels. Electronically Signed   By: Donavan Foil M.D.   On: 05/30/2016 17:02   Dg Chest Port 1 View  Result Date: 06/12/2016 CLINICAL DATA:  69 y/o F; atrial fibrillation and fluctuating blood pressure. EXAM: PORTABLE CHEST 1 VIEW COMPARISON:  03/19/2014 chest radiograph. FINDINGS: Stable cardiomediastinal silhouette within normal limits. Aortic atherosclerosis with calcification of the arch. Surgical clips project over left upper quadrant. Linear opacities at left lung base probably represent atelectasis or scarring and are similar to the prior study. No focal consolidation, pneumothorax, or pleural effusion. No acute osseous abnormality. IMPRESSION: No active disease. Electronically Signed   By: Kristine Garbe M.D.   On: 06/12/2016 13:36    Time Spent in minutes  30   Armonii Sieh K M.D on 06/14/2016 at 9:28 AM  Between 7am to 7pm - Pager - [phone]  After 7pm go to [email or web address] - password [facility]  [facility] -  Office  [phone]

## 2016-06-14 NOTE — Progress Notes (Signed)
Patient Name: Sabrina [name] Date of Encounter: 06/14/2016  Primary Cardiologist: Dr [facility] Problem List     Principal Problem:   Atrial fibrillation with RVR [facility]) Active Problems:   Acute/Chronic diastolic heart failure (HCC)   Asthma   Depression   Colovesical fistula   UTI (lower urinary tract infection)       Subjective  No complaints but HR fast when up and about   Inpatient Medications    Scheduled Meds: . apixaban  5 mg Oral BID  . diltiazem  180 mg Oral Daily  . DULoxetine  60 mg Oral QHS  . ferrous sulfate  325 mg Oral Once per day on Mon Wed Fri  . gabapentin  100 mg Oral BID  . pantoprazole  40 mg Oral Daily   Continuous Infusions:  PRN Meds:.acetaminophen, albuterol, ALPRAZolam, ondansetron (ZOFRAN) IV   Vital Signs    Vitals:   06/13/16 1622 06/13/16 2030 06/14/16 0500 06/14/16 0800  BP:  104/66 98/72 (!) 90/58  Pulse: 84 (!) 107 90 (!) 108  Resp:  19 17   Temp: 98.3 F (36.8 C) 98.5 F (36.9 C) 97.5 F (36.4 C)   TempSrc: Oral     SpO2: 95% 96% 97%   Weight:   186 lb 11.2 oz (84.7 kg)   Height:        Intake/Output Summary (Last 24 hours) at 06/14/16 1146 Last data filed at 06/14/16 0800  Gross per 24 hour  Intake              720 ml  Output                0 ml  Net              720 ml   Filed Weights   06/12/16 2100 06/13/16 0550 06/14/16 0500  Weight: 187 lb 14.4 oz (85.2 kg) 187 lb 14.4 oz (85.2 kg) 186 lb 11.2 oz (84.7 kg)    Physical Exam    GEN: Well nourished, well developed, in no acute distress.  HEENT: Grossly normal.  Neck: Supple, no   JVD, carotid bruits, or masses. Cardiac: rapid and  Irreg R&R, no murmurs, rubs, or gallops. No clubbing, cyanosis, edema.  Radials/DP/PT 2+ and equal bilaterally.  Respiratory:  Respirations regular and unlabored, few rales bases bilaterally. GI: Soft, nontender  MS: no deformity or atrophy. Skin: warm and dry, no rash. Neuro:  Strength and sensation are intact. Psych:  AAOx3.  Normal affect.  Labs    CBC  Recent Labs  06/12/16 2200 06/13/16 0516  WBC 8.0 7.0  NEUTROABS 4.5  --   HGB 13.6 13.2  HCT 42.8 42.1  MCV 94.9 95.7  PLT 352 [id number]   Basic Metabolic Panel  Recent Labs  06/12/16 1235 06/12/16 2200 06/13/16 0516  NA 141 137 140  K 4.0 3.5 4.0  CL 104 105 105  CO2 26 25 27   GLUCOSE 110* 223* 93  BUN 16 15 13   CREATININE 0.94 0.95 0.94  CALCIUM 9.3 8.7* 8.7*  MG 2.1  --   --    Liver Function Tests  Recent Labs  06/12/16 1235  AST 26  ALT 17  ALKPHOS 103  BILITOT 1.0  PROT 6.8  ALBUMIN 3.7   Cardiac Enzymes  Recent Labs  06/12/16 1235  TROPONINI <0.03   Thyroid Function Tests  Recent Labs  06/12/16 2200  TSH 7.795*    Telemetry    Atrial  fib, mostly RVR - Personally Reviewed  ECG    09/21, atrial fib, RVR, no acute - Personally Reviewed  Radiology    Dg Chest Port 1 View Result Date: 06/12/2016 CLINICAL DATA:  70 y/o F; atrial fibrillation and fluctuating blood pressure. EXAM: PORTABLE CHEST 1 VIEW COMPARISON:  03/19/2014 chest radiograph. FINDINGS: Stable cardiomediastinal silhouette within normal limits. Aortic atherosclerosis with calcification of the arch. Surgical clips project over left upper quadrant. Linear opacities at left lung base probably represent atelectasis or scarring and are similar to the prior study. No focal consolidation, pneumothorax, or pleural effusion. No acute osseous abnormality. IMPRESSION: No active disease. Electronically Signed   By: Kristine Garbe M.D.   On: 06/12/2016 13:36     Cardiac Studies   Echo ordered  Patient Profile     70 y.o.femalewith history of gastric bypass w/ complications, splenic injury requiring splenectomy, subsequent adhesions, diverticulitis, tubular adenoma colon, iron and B12 deficiency, anxiety/depression, fibromyalgia, COPD, recurrent UTI's, HLD,and diastolic CHF. 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 At o.v.  For cardiac pre-operative evaluation, pt in rapid a fib and admitted 09/21. IM consult for management of general medical issues.  Assessment & Plan    Principal Problem:   Atrial fibrillation with RVR (HCC) - new dx   Chronic diastolic heart failure (HCC) Hypotension   Euvolemic continue current meds   HR remains poorly controlled and BP precludes the uptitration of rate control Would not use dig, if DCCV anticipated Plan is for  TEEDCCV on Monday with which I agree, the implication however will be the need to delay surgery for a few weeks      Virl Axe, MD

## 2016-06-15 ENCOUNTER — Encounter (HOSPITAL_COMMUNITY): Payer: Self-pay | Admitting: Anesthesiology

## 2016-06-15 MED ORDER — NITROFURANTOIN MONOHYD MACRO 100 MG PO CAPS
100.0000 mg | ORAL_CAPSULE | Freq: Two times a day (BID) | ORAL | Status: DC
  Filled 2016-06-15: qty 1

## 2016-06-15 MED ORDER — SACCHAROMYCES BOULARDII 250 MG PO CAPS
250.0000 mg | ORAL_CAPSULE | Freq: Two times a day (BID) | ORAL | Status: DC
  Administered 2016-06-15 – 2016-06-17 (×5): 250 mg via ORAL
  Filled 2016-06-15 (×5): qty 1

## 2016-06-15 MED ORDER — AMOXICILLIN-POT CLAVULANATE 875-125 MG PO TABS
1.0000 | ORAL_TABLET | Freq: Two times a day (BID) | ORAL | Status: DC
  Administered 2016-06-15 – 2016-06-17 (×5): 1 via ORAL
  Filled 2016-06-15 (×5): qty 1

## 2016-06-15 NOTE — Progress Notes (Signed)
[facility] PROGRESS NOTE                                                                                                                                                                                                             Patient Demographics:    Sabrina [name], is a 70 y.o. female, DOB - 08/28/1946, [id number]  Admit date - 06/12/2016   Admitting Physician Thayer Headings, MD  Outpatient Primary MD for the patient is Sabrina Dun, FNP  LOS - 1  Chief Complaint  Patient presents with  . Shortness of Breath       Brief Narrative    Sabrina [name] is an 70 y.o. female anemia, anxiety, depression, fibromyalgia, diastolic CHF, GERD, HLD, peripheral neuropathy, colovesicular fistula presenting w/ Afib w/ RVR. Patient came to [facility] after evaluation by cardiology on the same day as admission. Patient was being seen by cardiology for preop cardiac clearance requested by [facility] for surgical intervention for colovesicular fistula. While at her cardiology appointment patient developed palpitations and shortness of breath. EKG at her appointment confirmed that she was in A. fib with RVR. Patient declined EMS transport to the ED and came by private vehicle. Patient denies any active chest pain, LOC, dizziness/vertigo, fevers, chest pain, confusion. Patient does endorse chronic persistent left lower quadrant pain for which she is undergoing workup and is felt to be due to her colovesicular fistula. Patient also endorses fecal sediment in her urine which has gotten worse over the last several days. She also endorses worsening suprapubic tenderness. Denies any flank pain or fevers.  Of note patient also states that she feels that her Lasix is no longer working as she now takes the medicine without any increased diuresis. Patient also endorses a 15 pound weight gain over the last 3 months that she feels is predominately due to fluid  retention.    Subjective:    Sabrina [name] today has, No headache, No chest pain, No abdominal pain - No Nausea, No new weakness tingling or numbness, No Cough - SOB.     Assessment  & Plan :   Hospitalist will sign off kindly call with questions.  UTI: Ruled out this is colonization and no treatment needed for this problem.  Afib RVR: New-onset.  Management per primary team - cardiology  Colovesicular fistula: Chronic. This happened after her gastric bypass surgery in [facility]  [place], patient is due for surgical correction by Dr. [name] in the near future, I discussed her case with Dr. [name], she is supposed to be on Augmentin for smoldering subacute to chronic diverticulitis causing the fistula, no stop date on Augmentin per Dr. Marcello Moores patient to continue this indefinitely till she sees Dr. Marcello Moores again in the office. We will add probiotic as well.  Depression/Anxiety: Recommend pt to continue cymbalta, Xanax   Peripheral neuropathy / fibromyalgia: recommend pt to continue neurontin  GERD: recommend pt continue PPI     DVT Prophylaxis  :  Eliquis  Lab Results  Component Value Date   PLT 343 06/13/2016    Inpatient Medications  Scheduled Meds: . amoxicillin-clavulanate  1 tablet Oral Q12H  . apixaban  5 mg Oral BID  . diltiazem  180 mg Oral Daily  . DULoxetine  60 mg Oral QHS  . ferrous sulfate  325 mg Oral Once per day on Mon Wed Fri  . gabapentin  100 mg Oral BID  . pantoprazole  40 mg Oral Daily   Continuous Infusions:  PRN Meds:.acetaminophen, albuterol, ALPRAZolam, ondansetron (ZOFRAN) IV  Antibiotics  :    Anti-infectives    Start     Dose/Rate Route Frequency Ordered Stop   06/15/16 1000  amoxicillin-clavulanate (AUGMENTIN) 875-125 MG per tablet 1 tablet     1 tablet Oral Every 12 hours 06/15/16 0859     06/14/16 2200  amoxicillin-clavulanate (AUGMENTIN) 875-125 MG per tablet 1 tablet  Status:  Discontinued     1 tablet Oral 2 times  daily 06/14/16 0908 06/14/16 0909   06/12/16 2200  amoxicillin-clavulanate (AUGMENTIN) 875-125 MG per tablet 1 tablet  Status:  Discontinued     1 tablet Oral 2 times daily 06/12/16 2059 06/14/16 0908         Objective:   Vitals:   06/14/16 0800 06/14/16 1410 06/14/16 2100 06/15/16 0500  BP: (!) 90/58 112/84 100/74 99/70  Pulse: (!) 108 86 91   Resp:  18 18 18   Temp:  97.6 F (36.4 C) 98 F (36.7 C) 98 F (36.7 C)  TempSrc:  Oral Oral Oral  SpO2:  99% 94% 95%  Weight:      Height:        Wt Readings from Last 3 Encounters:  06/14/16 84.7 kg (186 lb 11.2 oz)  06/12/16 84.4 kg (186 lb)  04/21/16 84 kg (185 lb 3.2 oz)     Intake/Output Summary (Last 24 hours) at 06/15/16 0945 Last data filed at 06/14/16 1759  Gross per 24 hour  Intake              120 ml  Output                0 ml  Net              120 ml     Physical Exam  Awake Alert, Oriented X 3, No new F.N deficits, Normal affect [facility].AT,PERRAL Supple Neck,No JVD, No cervical lymphadenopathy appriciated.  Symmetrical Chest wall movement, Good air movement bilaterally, CTAB iRRR,No Gallops,Rubs or new Murmurs, No Parasternal Heave +ve B.Sounds, Abd Soft, No tenderness, No organomegaly appriciated, No rebound - guarding or rigidity. No Cyanosis, Clubbing or edema, No new Rash or bruise       Data Review:    CBC  Recent Labs Lab 06/12/16 1235 06/12/16 2200 06/13/16 0516  WBC 9.0 8.0 7.0  HGB 15.9* 13.6 13.2  HCT 48.8*  42.8 42.1  PLT 400 352 343  MCV 94.6 94.9 95.7  MCH 30.8 30.2 30.0  MCHC 32.6 31.8 31.4  RDW 12.5 12.7 12.7  LYMPHSABS  --  2.8  --   MONOABS  --  0.3  --   EOSABS  --  0.2  --   BASOSABS  --  0.1  --     Chemistries   Recent Labs Lab 06/12/16 1235 06/12/16 2200 06/13/16 0516  NA 141 137 140  K 4.0 3.5 4.0  CL 104 105 105  CO2 26 25 27   GLUCOSE 110* 223* 93  BUN 16 15 13   CREATININE 0.94 0.95 0.94  CALCIUM 9.3 8.7* 8.7*  MG 2.1  --   --   AST 26  --   --   ALT 17   --   --   ALKPHOS 103  --   --   BILITOT 1.0  --   --    ------------------------------------------------------------------------------------------------------------------ No results for input(s): CHOL, HDL, LDLCALC, TRIG, CHOLHDL, LDLDIRECT in the last 72 hours.  Lab Results  Component Value Date   HGBA1C 5.4 11/27/2014   ------------------------------------------------------------------------------------------------------------------  Recent Labs  06/12/16 2200  TSH 7.795*   ------------------------------------------------------------------------------------------------------------------ No results for input(s): VITAMINB12, FOLATE, FERRITIN, TIBC, IRON, RETICCTPCT in the last 72 hours.  Coagulation profile  Recent Labs Lab 06/13/16 0516  INR 1.24    No results for input(s): DDIMER in the last 72 hours.  Cardiac Enzymes  Recent Labs Lab 06/12/16 1235  TROPONINI <0.03   ------------------------------------------------------------------------------------------------------------------ No results found for: BNP  Micro Results Recent Results (from the past 240 hour(s))  Urine culture     Status: Abnormal   Collection Time: 06/12/16  4:30 PM  Result Value Ref Range Status   Specimen Description URINE, RANDOM  Final   Special Requests NONE  Final   Culture (A)  Final    20,000 COLONIES/mL ESCHERICHIA COLI Confirmed Extended Spectrum Beta-Lactamase Producer (ESBL)    Report Status 06/14/2016 FINAL  Final   Organism ID, Bacteria ESCHERICHIA COLI (A)  Final      Susceptibility   Escherichia coli - MIC*    AMPICILLIN >=32 RESISTANT Resistant     CEFAZOLIN >=64 RESISTANT Resistant     CEFTRIAXONE >=64 RESISTANT Resistant     CIPROFLOXACIN >=4 RESISTANT Resistant     GENTAMICIN <=1 SENSITIVE Sensitive     IMIPENEM <=0.25 SENSITIVE Sensitive     NITROFURANTOIN <=16 SENSITIVE Sensitive     TRIMETH/SULFA >=320 RESISTANT Resistant     AMPICILLIN/SULBACTAM 16  INTERMEDIATE Intermediate     PIP/TAZO <=4 SENSITIVE Sensitive     Extended ESBL POSITIVE Resistant     * 20,000 COLONIES/mL ESCHERICHIA COLI    Radiology Reports Ct Pelvis W Contrast  Result Date: 05/30/2016 CLINICAL DATA:  Evaluate for rectal fistula. Feces in urine. Patient passes a year from her vagina. Fall vaginal discharge and pelvic pain EXAM: CT PELVIS WITH CONTRAST TECHNIQUE: Multidetector CT imaging of the pelvis was performed using the standard protocol following the bolus administration of intravenous contrast. Rectal contrast also administered. CONTRAST:  111mL ISOVUE-300 IOPAMIDOL (ISOVUE-300) INJECTION 61% COMPARISON:  CT abdomen pelvis 01/24/2016 FINDINGS: Urinary Tract: Tiny air pocket is present in anticipated location of the urethra. Urinary bladder is only filled with small amount of urine. Bladder is otherwise unremarkable. No gas collections are present within the bladder. Distal ureters unremarkable. Bowel: Numerous diverticula are present involving the descending and sigmoid colon. No significant bowel wall thickening or pericolonic  edema to suggest acute inflammation. No dilated distal small bowel. Vascular/Lymphatic: Scattered atherosclerotic calcifications in the distal aorta and iliac vessels. Reproductive: The uterus is surgically absent. There is a small amount of gas present within the vaginal vault. On the axial views, series 3., image number 36, there is thin crescent of slight increased density suggesting contrast within the vagina. Linear gas collection extends anteriorly from the left anterior aspect of the rectum towards the posterior wall of the vagina, series 3., image number 42. Findings could relate to rectal vaginal fistula. Other: No significant free pelvic fluid. Isolated surgical clips present in the right hemi abdomen. Musculoskeletal: There is moderate-to-marked disc space narrowing at L4-L5 and L5-S1 with vacuum discs and osteophytes. IMPRESSION: 1. Small  pockets of gas within the vagina with trace amount of contrast present in the vaginal vault. Small linear gas collection extending from the left anterior aspect of the rectum towards the posterior wall of the vagina ; collective findings are suspicious for rectovaginal fistula. 2. Numerous colon diverticula without CT evidence for acute diverticulitis. 3. Atherosclerotic vascular calcifications of the aorta and iliac vessels. Electronically Signed   By: Donavan Foil M.D.   On: 05/30/2016 17:02   Dg Chest Port 1 View  Result Date: 06/12/2016 CLINICAL DATA:  70 y/o F; atrial fibrillation and fluctuating blood pressure. EXAM: PORTABLE CHEST 1 VIEW COMPARISON:  03/19/2014 chest radiograph. FINDINGS: Stable cardiomediastinal silhouette within normal limits. Aortic atherosclerosis with calcification of the arch. Surgical clips project over left upper quadrant. Linear opacities at left lung base probably represent atelectasis or scarring and are similar to the prior study. No focal consolidation, pneumothorax, or pleural effusion. No acute osseous abnormality. IMPRESSION: No active disease. Electronically Signed   By: Kristine Garbe M.D.   On: 06/12/2016 13:36    Time Spent in minutes  30   SINGH,PRASHANT K M.D on 06/15/2016 at 9:45 AM  Between 7am to 7pm - Pager - [phone]  After 7pm go to [email or web address] - password [facility]  [facility] -  Office  [phone]

## 2016-06-15 NOTE — Anesthesia Preprocedure Evaluation (Deleted)
Anesthesia Evaluation  Patient identified by MRN, date of birth, ID band Patient awake    Reviewed: Allergy & Precautions, NPO status , Patient's Chart, lab work & pertinent test results  Airway        Dental   Pulmonary former smoker,           Cardiovascular hypertension, Pt. on medications +CHF (diastolic)  + dysrhythmias Atrial Fibrillation   HLD  TTE 06/13/2016: Study Conclusions  - Left ventricle: The cavity size was normal. There was mild   concentric hypertrophy. Systolic function was normal. The   estimated ejection fraction was in the range of 60% to 65%. Wall   motion was normal; there were no regional wall motion   abnormalities. There was no evidence of elevated ventricular   filling pressure by Doppler parameters. - Aortic valve: Trileaflet; normal thickness leaflets. There was no   regurgitation. - Mitral valve: Structurally normal valve. There was mild   regurgitation. - Left atrium: The atrium was severely dilated. - Right ventricle: The cavity size was normal. Wall thickness was   normal. Systolic function was normal. - Right atrium: The atrium was normal in size. - Tricuspid valve: There was mild regurgitation. - Pulmonic valve: There was no regurgitation. - Pulmonary arteries: Systolic pressure was within the normal   range. - Inferior vena cava: The vessel was normal in size. - Pericardium, extracardiac: There was no pericardial effusion.  Myocardial Perfusion Imaging 07/24/2014: normal   Neuro/Psych PSYCHIATRIC DISORDERS Anxiety Depression Peripheral neuropathy    GI/Hepatic GERD  Medicated,colovesicular fistula s/p gastric bypass    Endo/Other    Renal/GU      Musculoskeletal  (+) Arthritis , Fibromyalgia -  Abdominal   Peds  Hematology  (+) Blood dyscrasia, anemia ,   Anesthesia Other Findings   Reproductive/Obstetrics                              Anesthesia Physical Anesthesia Plan  ASA: III  Anesthesia Plan: MAC   Post-op Pain Management:    Induction: Intravenous  Airway Management Planned: Natural Airway and Nasal Cannula  Additional Equipment:   Intra-op Plan:   Post-operative Plan:   Informed Consent:   Dental advisory given  Plan Discussed with: CRNA  Anesthesia Plan Comments:         Anesthesia Quick Evaluation

## 2016-06-15 NOTE — Progress Notes (Signed)
Patient converted to SR, notified by ccmd, EKG obtained. Cards fellow notified. Will continue to monitor.

## 2016-06-15 NOTE — Progress Notes (Signed)
SUBJECTIVE:  No complaints today  OBJECTIVE:   Vitals:   Vitals:   06/14/16 0800 06/14/16 1410 06/14/16 2100 06/15/16 0500  BP: (!) 90/58 112/84 100/74 99/70  Pulse: (!) 108 86 91   Resp:  18 18 18   Temp:  97.6 F (36.4 C) 98 F (36.7 C) 98 F (36.7 C)  TempSrc:  Oral Oral Oral  SpO2:  99% 94% 95%  Weight:      Height:       I&O's:   Intake/Output Summary (Last 24 hours) at 06/15/16 [id number] Last data filed at 06/14/16 1759  Gross per 24 hour  Intake              120 ml  Output                0 ml  Net              120 ml   TELEMETRY: Reviewed telemetry pt in atrial fibrillation with HR 80-120's:     PHYSICAL EXAM General: Well developed, well nourished, in no acute distress Head: Eyes PERRLA, No xanthomas.   Normal cephalic and atramatic  Lungs:   Clear bilaterally to auscultation and percussion. Heart:   Irregularly irregular  S1 S2 Pulses are 2+ & equal. Abdomen: Bowel sounds are positive, abdomen soft and non-tender without masses  Msk:  Back normal, normal gait. Normal strength and tone for age. Extremities:   No clubbing, cyanosis or edema.  DP +1 Neuro: Alert and oriented X 3. Psych:  Good affect, responds appropriately   LABS: Basic Metabolic Panel:  Recent Labs  06/12/16 1235 06/12/16 2200 06/13/16 0516  NA 141 137 140  K 4.0 3.5 4.0  CL 104 105 105  CO2 26 25 27   GLUCOSE 110* 223* 93  BUN 16 15 13   CREATININE 0.94 0.95 0.94  CALCIUM 9.3 8.7* 8.7*  MG 2.1  --   --    Liver Function Tests:  Recent Labs  06/12/16 1235  AST 26  ALT 17  ALKPHOS 103  BILITOT 1.0  PROT 6.8  ALBUMIN 3.7   No results for input(s): LIPASE, AMYLASE in the last 72 hours. CBC:  Recent Labs  06/12/16 2200 06/13/16 0516  WBC 8.0 7.0  NEUTROABS 4.5  --   HGB 13.6 13.2  HCT 42.8 42.1  MCV 94.9 95.7  PLT 352 343   Cardiac Enzymes:  Recent Labs  06/12/16 1235  TROPONINI <0.03   BNP: Invalid input(s): POCBNP D-Dimer: No results for input(s):  DDIMER in the last 72 hours. Hemoglobin A1C: No results for input(s): HGBA1C in the last 72 hours. Fasting Lipid Panel: No results for input(s): CHOL, HDL, LDLCALC, TRIG, CHOLHDL, LDLDIRECT in the last 72 hours. Thyroid Function Tests:  Recent Labs  06/12/16 2200  TSH 7.795*   Anemia Panel: No results for input(s): VITAMINB12, FOLATE, FERRITIN, TIBC, IRON, RETICCTPCT in the last 72 hours. Coag Panel:   Lab Results  Component Value Date   INR 1.24 06/13/2016    RADIOLOGY: Ct Pelvis W Contrast  Result Date: 05/30/2016 CLINICAL DATA:  Evaluate for rectal fistula. Feces in urine. Patient passes a year from her vagina. Fall vaginal discharge and pelvic pain EXAM: CT PELVIS WITH CONTRAST TECHNIQUE: Multidetector CT imaging of the pelvis was performed using the standard protocol following the bolus administration of intravenous contrast. Rectal contrast also administered. CONTRAST:  187mL ISOVUE-300 IOPAMIDOL (ISOVUE-300) INJECTION 61% COMPARISON:  CT abdomen pelvis 01/24/2016 FINDINGS: Urinary Tract: Tiny air  pocket is present in anticipated location of the urethra. Urinary bladder is only filled with small amount of urine. Bladder is otherwise unremarkable. No gas collections are present within the bladder. Distal ureters unremarkable. Bowel: Numerous diverticula are present involving the descending and sigmoid colon. No significant bowel wall thickening or pericolonic edema to suggest acute inflammation. No dilated distal small bowel. Vascular/Lymphatic: Scattered atherosclerotic calcifications in the distal aorta and iliac vessels. Reproductive: The uterus is surgically absent. There is a small amount of gas present within the vaginal vault. On the axial views, series 3., image number 36, there is thin crescent of slight increased density suggesting contrast within the vagina. Linear gas collection extends anteriorly from the left anterior aspect of the rectum towards the posterior wall of the  vagina, series 3., image number 42. Findings could relate to rectal vaginal fistula. Other: No significant free pelvic fluid. Isolated surgical clips present in the right hemi abdomen. Musculoskeletal: There is moderate-to-marked disc space narrowing at L4-L5 and L5-S1 with vacuum discs and osteophytes. IMPRESSION: 1. Small pockets of gas within the vagina with trace amount of contrast present in the vaginal vault. Small linear gas collection extending from the left anterior aspect of the rectum towards the posterior wall of the vagina ; collective findings are suspicious for rectovaginal fistula. 2. Numerous colon diverticula without CT evidence for acute diverticulitis. 3. Atherosclerotic vascular calcifications of the aorta and iliac vessels. Electronically Signed   By: Donavan Foil M.D.   On: 05/30/2016 17:02   Dg Chest Port 1 View  Result Date: 06/12/2016 CLINICAL DATA:  70 y/o F; atrial fibrillation and fluctuating blood pressure. EXAM: PORTABLE CHEST 1 VIEW COMPARISON:  03/19/2014 chest radiograph. FINDINGS: Stable cardiomediastinal silhouette within normal limits. Aortic atherosclerosis with calcification of the arch. Surgical clips project over left upper quadrant. Linear opacities at left lung base probably represent atelectasis or scarring and are similar to the prior study. No focal consolidation, pneumothorax, or pleural effusion. No acute osseous abnormality. IMPRESSION: No active disease. Electronically Signed   By: Kristine Garbe M.D.   On: 06/12/2016 13:36    Patient Profile     70 y.o.femalewith history of gastric bypass w/ complications, splenic injury requiring splenectomy, subsequent adhesions, diverticulitis, tubular adenoma colon, iron and B12 deficiency, anxiety/depression, fibromyalgia, COPD, recurrent UTI's, HLD,and diastolic CHF. 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 At o.v. For cardiac pre-operative evaluation, pt in rapid a fib and admitted  09/21. IM consult for management of general medical issues.  Assessment & Plan    Principal Problem:   Atrial fibrillation with RVR (HCC) - new dx   Chronic diastolic heart failure (HCC)   Hypotension   Euvolemic continue current meds  HR remains poorly controlled and BP precludes the uptitration of rate control Would not use dig, since DCCV anticipated tomorrow Plan is for  TEE/DCCV on Monday, the implication however will be the need to delay surgery for a few weeks as she will need to be on anticoagulation for 4 consecutive weeks post DCCV prior to coming off.     Fransico Him, MD  06/15/2016  9:29 AM

## 2016-06-16 ENCOUNTER — Encounter (HOSPITAL_COMMUNITY)
Admission: EM | Disposition: A | Discharge status: Home / Self Care | Payer: Self-pay | Source: Home / Self Care | Attending: Cardiovascular Disease

## 2016-06-16 DIAGNOSIS — R0789 Other chest pain: Secondary | ICD-10-CM

## 2016-06-16 DIAGNOSIS — I5032 Chronic diastolic (congestive) heart failure: Secondary | ICD-10-CM

## 2016-06-16 SURGERY — ECHOCARDIOGRAM, TRANSESOPHAGEAL
Anesthesia: Monitor Anesthesia Care

## 2016-06-16 MED ORDER — DILTIAZEM HCL ER COATED BEADS 180 MG PO CP24: 180.0000 mg | ORAL_CAPSULE | Freq: Every day | ORAL | 6 refills | Status: DC

## 2016-06-16 MED ORDER — SACCHAROMYCES BOULARDII 250 MG PO CAPS: 250.0000 mg | ORAL_CAPSULE | Freq: Two times a day (BID) | ORAL | 6 refills | Status: DC

## 2016-06-16 MED ORDER — REGADENOSON 0.4 MG/5ML IV SOLN
0.4000 mg | Freq: Once | INTRAVENOUS | Status: AC
  Administered 2016-06-17: 0.4 mg via INTRAVENOUS
  Filled 2016-06-16: qty 5

## 2016-06-16 MED ORDER — APIXABAN 5 MG PO TABS: 5.0000 mg | ORAL_TABLET | Freq: Two times a day (BID) | ORAL | 6 refills | Status: DC

## 2016-06-16 NOTE — Plan of Care (Signed)
Problem: Activity: Goal: Ability to tolerate increased activity will improve Outcome: Completed/Met Date Met: 06/16/16 Heart rate controlled with patient ambulating in hallways independently.

## 2016-06-16 NOTE — Progress Notes (Addendum)
SUBJECTIVE:  No complaints today 70 yo who presented to the office on 06/12/16 for pre-op evaluation prior to having colo-vaginal fistula repair.  Presented with aFib with RVR   Has converted to NSR  Normal LV function . EF = 60-65%, mild MR   Reports having some mild chest pain this am  Tightness.   Unsure if its due to GI or not   OBJECTIVE:   Vitals:   Vitals:   06/15/16 0500 06/15/16 1323 06/15/16 2042 06/16/16 0615  BP: 99/70 110/71 98/62 114/74  Pulse:  (!) 104 91 75  Resp: 18  18 18   Temp: 98 F (36.7 C) 98.4 F (36.9 C) 98 F (36.7 C) 97.5 F (36.4 C)  TempSrc: Oral Oral Oral Oral  SpO2: 95% 94% 94% 100%  Weight:      Height:       I&O's:    Intake/Output Summary (Last 24 hours) at 06/16/16 0913 Last data filed at 06/15/16 2010  Gross per 24 hour  Intake              120 ml  Output                0 ml  Net              120 ml   TELEMETRY: Reviewed telemetry pt in atrial fibrillation with HR 80-120's:     PHYSICAL EXAM General: Well developed, well nourished, in no acute distress Head: Eyes PERRLA, No xanthomas.   Normal cephalic and atramatic  Lungs:   Clear bilaterally to auscultation and percussion. Heart:   Irregularly irregular  S1 S2 Pulses are 2+ & equal. Abdomen: Bowel sounds are positive, abdomen soft and non-tender without masses  Msk:  Back normal, normal gait. Normal strength and tone for age. Extremities:   No clubbing, cyanosis or edema.  DP +1 Neuro: Alert and oriented X 3. Psych:  Good affect, responds appropriately   LABS: Basic Metabolic Panel: No results for input(s): NA, K, CL, CO2, GLUCOSE, BUN, CREATININE, CALCIUM, MG, PHOS in the last 72 hours. Liver Function Tests: No results for input(s): AST, ALT, ALKPHOS, BILITOT, PROT, ALBUMIN in the last 72 hours. No results for input(s): LIPASE, AMYLASE in the last 72 hours. CBC: No results for input(s): WBC, NEUTROABS, HGB, HCT, MCV, PLT in the last 72 hours. Cardiac Enzymes: No  results for input(s): CKTOTAL, CKMB, CKMBINDEX, TROPONINI in the last 72 hours. BNP: Invalid input(s): POCBNP D-Dimer: No results for input(s): DDIMER in the last 72 hours. Hemoglobin A1C: No results for input(s): HGBA1C in the last 72 hours. Fasting Lipid Panel: No results for input(s): CHOL, HDL, LDLCALC, TRIG, CHOLHDL, LDLDIRECT in the last 72 hours. Thyroid Function Tests: No results for input(s): TSH, T4TOTAL, T3FREE, THYROIDAB in the last 72 hours.  Invalid input(s): FREET3 Anemia Panel: No results for input(s): VITAMINB12, FOLATE, FERRITIN, TIBC, IRON, RETICCTPCT in the last 72 hours. Coag Panel:   Lab Results  Component Value Date   INR 1.24 06/13/2016    RADIOLOGY: Ct Pelvis W Contrast  Result Date: 05/30/2016 CLINICAL DATA:  Evaluate for rectal fistula. Feces in urine. Patient passes a year from her vagina. Fall vaginal discharge and pelvic pain EXAM: CT PELVIS WITH CONTRAST TECHNIQUE: Multidetector CT imaging of the pelvis was performed using the standard protocol following the bolus administration of intravenous contrast. Rectal contrast also administered. CONTRAST:  166mL ISOVUE-300 IOPAMIDOL (ISOVUE-300) INJECTION 61% COMPARISON:  CT abdomen pelvis 01/24/2016 FINDINGS: Urinary Tract: Tiny air  pocket is present in anticipated location of the urethra. Urinary bladder is only filled with small amount of urine. Bladder is otherwise unremarkable. No gas collections are present within the bladder. Distal ureters unremarkable. Bowel: Numerous diverticula are present involving the descending and sigmoid colon. No significant bowel wall thickening or pericolonic edema to suggest acute inflammation. No dilated distal small bowel. Vascular/Lymphatic: Scattered atherosclerotic calcifications in the distal aorta and iliac vessels. Reproductive: The uterus is surgically absent. There is a small amount of gas present within the vaginal vault. On the axial views, series 3., image number 36, there  is thin crescent of slight increased density suggesting contrast within the vagina. Linear gas collection extends anteriorly from the left anterior aspect of the rectum towards the posterior wall of the vagina, series 3., image number 42. Findings could relate to rectal vaginal fistula. Other: No significant free pelvic fluid. Isolated surgical clips present in the right hemi abdomen. Musculoskeletal: There is moderate-to-marked disc space narrowing at L4-L5 and L5-S1 with vacuum discs and osteophytes. IMPRESSION: 1. Small pockets of gas within the vagina with trace amount of contrast present in the vaginal vault. Small linear gas collection extending from the left anterior aspect of the rectum towards the posterior wall of the vagina ; collective findings are suspicious for rectovaginal fistula. 2. Numerous colon diverticula without CT evidence for acute diverticulitis. 3. Atherosclerotic vascular calcifications of the aorta and iliac vessels. Electronically Signed   By: Donavan Foil M.D.   On: 05/30/2016 17:02   Dg Chest Port 1 View  Result Date: 06/12/2016 CLINICAL DATA:  70 y/o F; atrial fibrillation and fluctuating blood pressure. EXAM: PORTABLE CHEST 1 VIEW COMPARISON:  03/19/2014 chest radiograph. FINDINGS: Stable cardiomediastinal silhouette within normal limits. Aortic atherosclerosis with calcification of the arch. Surgical clips project over left upper quadrant. Linear opacities at left lung base probably represent atelectasis or scarring and are similar to the prior study. No focal consolidation, pneumothorax, or pleural effusion. No acute osseous abnormality. IMPRESSION: No active disease. Electronically Signed   By: Kristine Garbe M.D.   On: 06/12/2016 13:36    Patient Profile     70 y.o.femalewith history of gastric bypass w/ complications, splenic injury requiring splenectomy, subsequent adhesions, diverticulitis, tubular adenoma colon, iron and B12 deficiency,  anxiety/depression, fibromyalgia, COPD, recurrent UTI's, HLD,and diastolic CHF. 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 At o.v. For cardiac pre-operative evaluation, pt in rapid a fib and admitted 09/21. IM consult for management of general medical issues.  Assessment & Plan    Principal Problem:   Atrial fibrillation with RVR (HCC) - new dx   Chronic diastolic heart failure (HCC)   Hypotension   Has converted to NSR  Will ambulate   2. Chest pain :   Unclear etiology . Will get a Lexicographer tomorrow  Possible DC tomorrow if its normal / low risk       Sabrina Moores, MD  06/16/2016  9:13 AM

## 2016-06-16 NOTE — Discharge Summary (Signed)
Discharge Summary    Patient ID: Sabrina [name],  MRN: [id number], DOB/AGE: 1946-06-13 70 y.o.  Admit date: 06/12/2016 Discharge date: 06/17/2016  Primary Care Provider: Evelina Dun Primary Cardiologist: Dr. Lovena Le  Discharge Diagnoses    Principal Problem:   Atrial fibrillation with RVR [facility]) Active Problems:   Chronic diastolic heart failure (HCC)   Asthma   Generalized anxiety disorder   Depression   Colovesical fistula   UTI (lower urinary tract infection)   GERD (gastroesophageal reflux disease)   Fibromyalgia   Peripheral neuropathy (HCC)   Atrial fibrillation (HCC)   Allergies Allergies  Allergen Reactions  . Codeine Nausea And Vomiting    Diagnostic Studies/Procedures    TTE: 06/13/16  Study Conclusions  - Left ventricle: The cavity size was normal. There was mild   concentric hypertrophy. Systolic function was normal. The   estimated ejection fraction was in the range of 60% to 65%. Wall   motion was normal; there were no regional wall motion   abnormalities. There was no evidence of elevated ventricular   filling pressure by Doppler parameters. - Aortic valve: Trileaflet; normal thickness leaflets. There was no   regurgitation. - Mitral valve: Structurally normal valve. There was mild   regurgitation. - Left atrium: The atrium was severely dilated. - Right ventricle: The cavity size was normal. Wall thickness was   normal. Systolic function was normal. - Right atrium: The atrium was normal in size. - Tricuspid valve: There was mild regurgitation. - Pulmonic valve: There was no regurgitation. - Pulmonary arteries: Systolic pressure was within the normal   range. - Inferior vena cava: The vessel was normal in size. - Pericardium, extracardiac: There was no pericardial effusion.  NST 06/17/16 ___Result     There was no ST segment deviation noted during stress.  The study is normal.  This is a low risk study.  The left ventricular  ejection fraction is hyperdynamic (>65%).   Normal pharmacologic stress test with no evidence of scar or ischemia.     __________   History of Present Illness     Sabrina C Raineyis a 70 y.o.femalewith history of gastric bypass complicated by adhesion, bowel obstruction, splenic injury requiring splenectomy, subsequent adhesions, diverticulitis, tubular adenoma colon, iron abd B12 deficiency, anxiety/depression, fibromyalgia, COPD, recurrent UTI's, has been found with a colonoicvaginal fistula requiring surgical intervention and requires cardiac pre-operative evaluation and risk assessment. From a cardiac standpoint, history includes HLD, ?HTN, and diastolic CHF, previously/last seen by Dr. Lovena Le January 2016, at that time with c/o CP/SOB, having undergone echo and stress testing that was negative, he symptoms suspected at least in part to diastolic CHF (as well as obesity, and rx symbicort by pulmonary service) and tx with lasix. + family cardiac history, reports her mother had heart attacks and strokes.  She presented to the office on 9/21 to be seen for Dr. Lovena Le for pre-op evaluation She reports seeing her surgeon either Monday or tuesday and told of a recurrent infection and started on Augmentin. She reports on Saturday over the weekend she suddenly felt like something had changed, she was driving had trouble concentrating or felt like she wasn't sure what was happening, with some blurred vision, pulled over and her boyfriend drove home, she did not have any other symptoms no focal weakness, no near syncope or syncope, no CP and did not perceive her heart racing. She took her BP and noted it low with fast HR on the machine. She has been  having chills with cold sweats on/off since the weekend and waxing/waning chronic abdominal pain (LLQ) and nausea. She did not seek attention since she had a doctor's appointment Tuesday, kept an eye on her BP/HR both seemed erratic to her high and  low, Tuesday she saw the surgeon, was told her heart beat was fast and needed to see the cardiologist, and started on the antibiotic. She states she still has feeling of being disconnected like she doesn't feel like herself, generally weak, again no focal weakness, no speech difficulties, no CP, she does feel palpitations.   In the office she was noted to be in new onset Atrial Fib RVR, she was sent to the ED for concerns related to an underlying condition given reports of recurrent infection and known abd issues.   Hospital Course     She was started on IV diltiazem while in the ED, along with Eliquis for anticoagulation. Internal Medicine was consulted regarding management of her chronic medical conditions including colovesicular fistula, UTI, depression/anxiety and peripheral neuropathy. She was started on PO Cardizem the following day, but blood pressure ran in the 90s preventing the up titration of medication. She ultimately converted to NSR. She appeared to be tolerating Eliquis well, with stable Hgb. In regard to her UTI, she was continued on Augmentin with a plan to continue for at least 5-7 days of treatment.   In regards to her UTI, discussion was had with Dr. [name] who reports that she will need to be on Augmentin for a smoldering subacute to chronic diverticulitis which is causing her fistula, and there is no current stop date for antibiotics. This is to be continued indefinitely until she sees Dr. Marcello Moores again in the office. Probiotics were added.   She converted to SR on 9/24 around 6pm, with EKG to confirm. Of note her Echo this admission showed normal 60-65% mild MR.   She also complained of CP and it was recommended that she undergo a NST. This was performed 06/17/16 and was negative for ischemia. EF was >65%.   She was seen and assessed by Dr. Acie Fredrickson on 9/26 and determined stable for discharge home.   Consultants: Internal Medicine ____________  Discharge Vitals Blood  pressure 122/75, pulse 92, temperature 97.8 F (36.6 C), temperature source Axillary, resp. rate 20, height 5\' 4"  (1.626 m), weight 187 lb (84.8 kg), SpO2 99 %.  Filed Weights   06/13/16 0550 06/14/16 0500 06/17/16 0500  Weight: 187 lb 14.4 oz (85.2 kg) 186 lb 11.2 oz (84.7 kg) 187 lb (84.8 kg)    Labs & Radiologic Studies    CBC No results for input(s): WBC, NEUTROABS, HGB, HCT, MCV, PLT in the last 72 hours. Basic Metabolic Panel No results for input(s): NA, K, CL, CO2, GLUCOSE, BUN, CREATININE, CALCIUM, MG, PHOS in the last 72 hours. Liver Function Tests No results for input(s): AST, ALT, ALKPHOS, BILITOT, PROT, ALBUMIN in the last 72 hours. No results for input(s): LIPASE, AMYLASE in the last 72 hours. Cardiac Enzymes No results for input(s): CKTOTAL, CKMB, CKMBINDEX, TROPONINI in the last 72 hours. BNP Invalid input(s): POCBNP D-Dimer No results for input(s): DDIMER in the last 72 hours. Hemoglobin A1C No results for input(s): HGBA1C in the last 72 hours. Fasting Lipid Panel No results for input(s): CHOL, HDL, LDLCALC, TRIG, CHOLHDL, LDLDIRECT in the last 72 hours. Thyroid Function Tests No results for input(s): TSH, T4TOTAL, T3FREE, THYROIDAB in the last 72 hours.  Invalid input(s): FREET3 _____________   Dg Chest  Port 1 View  Result Date: 06/12/2016 CLINICAL DATA:  70 y/o F; atrial fibrillation and fluctuating blood pressure. EXAM: PORTABLE CHEST 1 VIEW COMPARISON:  03/19/2014 chest radiograph. FINDINGS: Stable cardiomediastinal silhouette within normal limits. Aortic atherosclerosis with calcification of the arch. Surgical clips project over left upper quadrant. Linear opacities at left lung base probably represent atelectasis or scarring and are similar to the prior study. No focal consolidation, pneumothorax, or pleural effusion. No acute osseous abnormality. IMPRESSION: No active disease. Electronically Signed   By: Kristine Garbe M.D.   On: 06/12/2016 13:36    Disposition   Pt is being discharged home today in good condition.  Follow-up Plans & Appointments    Follow-up Information    Mertie Moores, MD Follow up on 06/23/2016.   Specialty:  Cardiology Why:  9:45 am  Contact information: [address] [place] [address] [phone]          Discharge Instructions    Diet - low sodium heart healthy    Complete by:  As directed    Increase activity slowly    Complete by:  As directed       Discharge Medications   Current Discharge Medication List    START taking these medications   Details  !! apixaban (ELIQUIS) 5 MG TABS tablet Take 1 tablet (5 mg total) by mouth 2 (two) times daily. Qty: 60 tablet, Refills: 6    !! apixaban (ELIQUIS) 5 MG TABS tablet Take 1 tablet (5 mg total) by mouth 2 (two) times daily. Qty: 60 tablet, Refills: 0    diltiazem (CARDIZEM CD) 180 MG 24 hr capsule Take 1 capsule (180 mg total) by mouth daily. Qty: 30 capsule, Refills: 6    saccharomyces boulardii (FLORASTOR) 250 MG capsule Take 1 capsule (250 mg total) by mouth 2 (two) times daily. Qty: 60 capsule, Refills: 6     !! - Potential duplicate medications found. Please discuss with provider.    CONTINUE these medications which have NOT CHANGED   Details  albuterol (PROVENTIL HFA;VENTOLIN HFA) 108 (90 BASE) MCG/ACT inhaler Inhale 2 puffs into the lungs every 4 (four) hours as needed for wheezing or shortness of breath. Reported on 11/22/2015    ALPRAZolam (XANAX) 0.5 MG tablet TAKE 1 TABLET BY MOUTH AT BEDTIME AS NEEDED FOR ANXIETY Qty: 45 tablet, Refills: 1    amoxicillin-clavulanate (AUGMENTIN) 875-125 MG tablet Take 1 tablet by mouth 2 (two) times daily.    DULoxetine (CYMBALTA) 60 MG capsule TAKE 1 CAPSULE (60 MG TOTAL) BY MOUTH DAILY. Qty: 30 capsule, Refills: 0   Associated Diagnoses: GAD (generalized anxiety disorder)    ferrous sulfate 325 (65 FE) MG tablet Take 325 mg by mouth 3 (three) times a week.     fluticasone (FLONASE) 50 MCG/ACT nasal spray Place 2 sprays into both nostrils daily. Qty: 16 g, Refills: 5    furosemide (LASIX) 20 MG tablet TAKE 1 TABLET (20 MG TOTAL) BY MOUTH 2 (TWO) TIMES DAILY. Qty: 60 tablet, Refills: 3    gabapentin (NEURONTIN) 100 MG capsule Take 1 Tab for 1 day, then 1 tab twice a day, then 1 tab TID Qty: 270 capsule, Refills: 1   Associated Diagnoses: Neuropathic pain    halobetasol (ULTRAVATE) 0.05 % cream APPLY TOPICALLY 2 (TWO) TIMES DAILY. Qty: 50 g, Refills: 2    omeprazole (PRILOSEC) 40 MG capsule Take 1 capsule (40 mg total) by mouth daily. Open capsule and sprinkle on pudding, apple sauce or something  soft.  One every am on an empty stomach 20 -30 minutes prior to breakfast. Qty: 30 capsule, Refills: 5   Associated Diagnoses: Abdominal pain, left upper quadrant    Vitamin D, Ergocalciferol, (DRISDOL) 50000 units CAPS capsule TAKE 1 CAPSULE (50,000 UNITS TOTAL) BY MOUTH EVERY 7 (SEVEN) DAYS. Qty: 12 capsule, Refills: 0    albuterol (PROVENTIL) (2.5 MG/3ML) 0.083% nebulizer solution Take 3 mLs (2.5 mg total) by nebulization every 6 (six) hours as needed for wheezing or shortness of breath. Qty: 150 mL, Refills: 1          Outstanding Labs/Studies     Duration of Discharge Encounter   Greater than 30 minutes including physician time.  Signed, Lyda Jester,  PA-C 06/17/2016, 5:09 PM   Attending Note:   The patient was seen and examined.  Agree with assessment and plan as noted above.  Changes made to the above note as needed.  Patient seen and independently examined with Lyda Jester, PA.   We discussed all aspects of the encounter. I agree with the assessment and plan as stated above.  Pt is stable. HR is better controlled.  On Eliquis. Had some CP but the myoview is negative for ischemia   I have spent a total of 40 minutes with patient reviewing hospital  notes , telemetry, EKGs, labs and examining patient as well as  establishing an assessment and plan that was discussed with the patient. > 50% of time was spent in direct patient care.    Thayer Headings, Brooke Bonito., MD, [facility] 06/18/2016, 3:40 PM [id number] N[address]  [place] Pager [phone]

## 2016-06-17 ENCOUNTER — Inpatient Hospital Stay (HOSPITAL_COMMUNITY): Payer: Commercial Managed Care - HMO

## 2016-06-17 DIAGNOSIS — R079 Chest pain, unspecified: Secondary | ICD-10-CM

## 2016-06-17 DIAGNOSIS — Z23 Encounter for immunization: Secondary | ICD-10-CM | POA: Diagnosis not present

## 2016-06-17 LAB — NM MYOCAR MULTI W/SPECT W/WALL MOTION / EF
CHL CUP NUCLEAR SDS: 6
CHL CUP NUCLEAR SRS: 1
CHL CUP NUCLEAR SSS: 7
CHL CUP RESTING HR STRESS: 80 {beats}/min
CSEPED: 5 min
Estimated workload: 1 METS
Exercise duration (sec): 16 s
LHR: 0
LVDIAVOL: 60 mL (ref 46–106)
LVSYSVOL: 15 mL
NUC STRESS TID: 1.07
Peak HR: 85 {beats}/min

## 2016-06-17 MED ORDER — APIXABAN 5 MG PO TABS: 5.0000 mg | ORAL_TABLET | Freq: Two times a day (BID) | ORAL | 0 refills | Status: DC

## 2016-06-17 MED ORDER — TECHNETIUM TC 99M TETROFOSMIN IV KIT
10.0000 | PACK | Freq: Once | INTRAVENOUS | Status: AC | PRN
  Administered 2016-06-17: 10 via INTRAVENOUS

## 2016-06-17 MED ORDER — TECHNETIUM TC 99M TETROFOSMIN IV KIT
30.0000 | PACK | Freq: Once | INTRAVENOUS | Status: AC | PRN
  Administered 2016-06-17: 30 via INTRAVENOUS

## 2016-06-17 MED ORDER — REGADENOSON 0.4 MG/5ML IV SOLN
INTRAVENOUS | Status: AC
  Administered 2016-06-17: 0.4 mg via INTRAVENOUS
  Filled 2016-06-17: qty 5

## 2016-06-17 NOTE — Plan of Care (Signed)
Problem: Safety: Goal: Ability to remain free from injury will improve Outcome: Completed/Met Date Met: 06/17/16 Instructed patient to ask about driving at her Cardiologist followup appointment next week.

## 2016-06-17 NOTE — Progress Notes (Signed)
Reviewed discharge instructions with patient and she stated her understanding.  Discharged home with family via wheelchair.  [name]

## 2016-06-17 NOTE — Plan of Care (Signed)
Problem: Safety: Goal: Ability to remain free from injury will improve Outcome: Completed/Met Date Met: 06/17/16 Instructed patient to ask Cardiologist about driving at her followup appointment next week.

## 2016-06-17 NOTE — Progress Notes (Addendum)
Patient Name: Sabrina [name] Date of Encounter: 06/17/2016  Pt. Profile:  70 y.o.femalewith history of gastric bypass w/ complications, splenic injury requiring splenectomy, subsequent adhesions, diverticulitis, tubular adenoma colon, iron and B12 deficiency, anxiety/depression, fibromyalgia, COPD, recurrent UTI's, HLD,and diastolic CHF. 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 At o.v. for cardiac pre-operative evaluation, pt in rapid a fib and admitted 09/21. IM consult for management of general medical issues.  SUBJECTIVE  Last night had exertional dyspnea requiring supplemental oxygen. Chest tightness 4/10 currently in nuc med. Did not feels like typical GERD.   CURRENT MEDS . amoxicillin-clavulanate  1 tablet Oral Q12H  . apixaban  5 mg Oral BID  . diltiazem  180 mg Oral Daily  . DULoxetine  60 mg Oral QHS  . ferrous sulfate  325 mg Oral Once per day on Mon Wed Fri  . gabapentin  100 mg Oral BID  . pantoprazole  40 mg Oral Daily  . saccharomyces boulardii  250 mg Oral BID    OBJECTIVE  Vitals:   06/17/16 0926 06/17/16 0957 06/17/16 0959 06/17/16 1001  BP: 117/75 121/75 104/72 107/67  Pulse: 80 86 90 88  Resp:      Temp:      TempSrc:      SpO2:      Weight:      Height:        Intake/Output Summary (Last 24 hours) at 06/17/16 1001 Last data filed at 06/16/16 1816  Gross per 24 hour  Intake              360 ml  Output                0 ml  Net              360 ml   Filed Weights   06/13/16 0550 06/14/16 0500 06/17/16 0500  Weight: 187 lb 14.4 oz (85.2 kg) 186 lb 11.2 oz (84.7 kg) 187 lb (84.8 kg)    PHYSICAL EXAM  General: Pleasant, NAD. Neuro: Alert and oriented X 3. Moves all extremities spontaneously. Psych: Normal affect. HEENT:  Normal  Neck: Supple without bruits or JVD. Lungs:  Resp regular and unlabored, CTA. Heart: RRR no s3, s4, or murmurs. Abdomen: Soft, non-tender, non-distended, BS + x 4.  Extremities: No clubbing,  cyanosis or edema. DP/PT/Radials 2+ and equal bilaterally.  Accessory Clinical Findings  CBC No results for input(s): WBC, NEUTROABS, HGB, HCT, MCV, PLT in the last 72 hours. Basic Metabolic Panel No results for input(s): NA, K, CL, CO2, GLUCOSE, BUN, CREATININE, CALCIUM, MG, PHOS in the last 72 hours. Liver Function Tests No results for input(s): AST, ALT, ALKPHOS, BILITOT, PROT, ALBUMIN in the last 72 hours. No results for input(s): LIPASE, AMYLASE in the last 72 hours. Cardiac Enzymes No results for input(s): CKTOTAL, CKMB, CKMBINDEX, TROPONINI in the last 72 hours. BNP Invalid input(s): POCBNP D-Dimer No results for input(s): DDIMER in the last 72 hours. Hemoglobin A1C No results for input(s): HGBA1C in the last 72 hours. Fasting Lipid Panel No results for input(s): CHOL, HDL, LDLCALC, TRIG, CHOLHDL, LDLDIRECT in the last 72 hours. Thyroid Function Tests No results for input(s): TSH, T4TOTAL, T3FREE, THYROIDAB in the last 72 hours.  Invalid input(s): FREET3  TELE  Unable to review as patient seen in nuc med  Radiology/Studies  Ct Pelvis W Contrast  Result Date: 05/30/2016 CLINICAL DATA:  Evaluate for rectal fistula. Feces in urine. Patient passes a year from her  vagina. Fall vaginal discharge and pelvic pain EXAM: CT PELVIS WITH CONTRAST TECHNIQUE: Multidetector CT imaging of the pelvis was performed using the standard protocol following the bolus administration of intravenous contrast. Rectal contrast also administered. CONTRAST:  145mL ISOVUE-300 IOPAMIDOL (ISOVUE-300) INJECTION 61% COMPARISON:  CT abdomen pelvis 01/24/2016 FINDINGS: Urinary Tract: Tiny air pocket is present in anticipated location of the urethra. Urinary bladder is only filled with small amount of urine. Bladder is otherwise unremarkable. No gas collections are present within the bladder. Distal ureters unremarkable. Bowel: Numerous diverticula are present involving the descending and sigmoid colon. No  significant bowel wall thickening or pericolonic edema to suggest acute inflammation. No dilated distal small bowel. Vascular/Lymphatic: Scattered atherosclerotic calcifications in the distal aorta and iliac vessels. Reproductive: The uterus is surgically absent. There is a small amount of gas present within the vaginal vault. On the axial views, series 3., image number 36, there is thin crescent of slight increased density suggesting contrast within the vagina. Linear gas collection extends anteriorly from the left anterior aspect of the rectum towards the posterior wall of the vagina, series 3., image number 42. Findings could relate to rectal vaginal fistula. Other: No significant free pelvic fluid. Isolated surgical clips present in the right hemi abdomen. Musculoskeletal: There is moderate-to-marked disc space narrowing at L4-L5 and L5-S1 with vacuum discs and osteophytes. IMPRESSION: 1. Small pockets of gas within the vagina with trace amount of contrast present in the vaginal vault. Small linear gas collection extending from the left anterior aspect of the rectum towards the posterior wall of the vagina ; collective findings are suspicious for rectovaginal fistula. 2. Numerous colon diverticula without CT evidence for acute diverticulitis. 3. Atherosclerotic vascular calcifications of the aorta and iliac vessels. Electronically Signed   By: Donavan Foil M.D.   On: 05/30/2016 17:02   Dg Chest Port 1 View  Result Date: 06/12/2016 CLINICAL DATA:  70 y/o F; atrial fibrillation and fluctuating blood pressure. EXAM: PORTABLE CHEST 1 VIEW COMPARISON:  03/19/2014 chest radiograph. FINDINGS: Stable cardiomediastinal silhouette within normal limits. Aortic atherosclerosis with calcification of the arch. Surgical clips project over left upper quadrant. Linear opacities at left lung base probably represent atelectasis or scarring and are similar to the prior study. No focal consolidation, pneumothorax, or pleural  effusion. No acute osseous abnormality. IMPRESSION: No active disease. Electronically Signed   By: Kristine Garbe M.D.   On: 06/12/2016 13:36    ASSESSMENT AND PLAN Principal Problem:   Atrial fibrillation with RVR (HCC) Active Problems:   Chronic diastolic heart failure (HCC)   Asthma   Generalized anxiety disorder   Depression   Colovesical fistula   UTI (lower urinary tract infection)   GERD (gastroesophageal reflux disease)   Fibromyalgia   Peripheral neuropathy (HCC)   Atrial fibrillation (HCC)    Plan: Maintaining sinus rhythm. Exertional dyspnea with ambulation yesterday with controlled rate. Chest tightness this morning. Pending nuc result. Echo showed EF of 60-65%. Cardiac clearance per MD.   Jarrett Soho PA-C Pager [phone]  Attending Note:   The patient was seen and examined.  Agree with assessment and plan as noted above.  Changes made to the above note as needed.  Patient seen and independently examined with Vin Bhagat.   We discussed all aspects of the encounter. I agree with the assessment and plan as stated above.  Pt is stable .   May be able to go home if the myoview is stable    I have spent a  total of 40 minutes with patient reviewing hospital  notes , telemetry, EKGs, labs and examining patient as well as establishing an assessment and plan that was discussed with the patient. > 50% of time was spent in direct patient care.    Thayer Headings, Brooke Bonito., MD, [facility] 06/18/2016, 3:41 PM [id number] N[address]  [place] Pager [phone]

## 2016-06-19 ENCOUNTER — Other Ambulatory Visit: Payer: Self-pay | Admitting: Family

## 2016-06-19 DIAGNOSIS — F411 Generalized anxiety disorder: Secondary | ICD-10-CM

## 2016-06-20 ENCOUNTER — Encounter: Payer: Self-pay | Admitting: Cardiovascular Disease

## 2016-06-23 ENCOUNTER — Encounter: Payer: Self-pay | Admitting: Cardiovascular Disease

## 2016-06-23 ENCOUNTER — Ambulatory Visit (INDEPENDENT_AMBULATORY_CARE_PROVIDER_SITE_OTHER): Payer: Commercial Managed Care - HMO | Admitting: Cardiovascular Disease

## 2016-06-23 VITALS — BP 120/84 | HR 95 | Ht 64.0 in | Wt 182.6 lb

## 2016-06-23 DIAGNOSIS — I10 Essential (primary) hypertension: Secondary | ICD-10-CM | POA: Diagnosis not present

## 2016-06-23 DIAGNOSIS — I480 Paroxysmal atrial fibrillation: Secondary | ICD-10-CM | POA: Diagnosis not present

## 2016-06-23 DIAGNOSIS — E782 Mixed hyperlipidemia: Secondary | ICD-10-CM

## 2016-06-23 DIAGNOSIS — E785 Hyperlipidemia, unspecified: Secondary | ICD-10-CM | POA: Diagnosis not present

## 2016-06-23 MED ORDER — ATORVASTATIN CALCIUM 20 MG PO TABS: 20.0000 mg | ORAL_TABLET | Freq: Every day | ORAL | 11 refills | Status: DC

## 2016-06-23 MED ORDER — METOPROLOL TARTRATE 25 MG PO TABS: 25.0000 mg | ORAL_TABLET | Freq: Two times a day (BID) | ORAL | 11 refills | Status: DC

## 2016-06-23 NOTE — Patient Instructions (Addendum)
Medication Instructions:  STOP Simvastatin START Atorvastatin (Lipitor) 20 mg once daily START Metoprolol (Lopressor) 25 mg twice daily - take 12 hours apart   Labwork: Your physician recommends that you return for lab work on: Tuesday January 2 at 10:00 am You will need to FAST for this appointment - nothing to eat or drink after midnight the night before except water.   Testing/Procedures: None Ordered   Follow-Up: Your physician recommends that you return for a follow-up appointment on Thursday September 25, 2016 at 11:15 am   If you need a refill on your cardiac medications before your next appointment, please call your pharmacy.   Thank you for choosing [facility]! Christen Bame, RN [phone]

## 2016-06-23 NOTE — Progress Notes (Signed)
Cardiology Office Note   Date:  06/23/2016   ID:  Sabrina, [name] Jul 18, 1946, MRN [id number]  PCP:  Evelina Dun, FNP  Cardiologist:   Mertie Moores, MD   Chief Complaint  Patient presents with  . Atrial Fibrillation      History of Present Illness: Sabrina [name] is a 70 y.o. female who presents for Preoperative evaluation.  This Randolm Idol was seen in the office with Sabrina Maw, PA and was admitted to the hospital with rapid atrial fibrillation. She converted on IV dilt to sinus rhythm .  Has done well. Feeling better    Past Medical History:  Diagnosis Date  . Allergy   . Anemia   . Anxiety   . Arthritis   . Cataract   . Chronic diastolic heart failure ([facility])   . Collagen vascular disease ([place])   . Depression   . Fibromyalgia   . Fistula   . GERD (gastroesophageal reflux disease)   . Hyperlipidemia   . Neuropathy ([facility])    legs and feet  . Tubular adenoma of colon     Past Surgical History:  Procedure Laterality Date  . ABDOMINAL HYSTERECTOMY    . APPENDECTOMY    . broken hip     left; metal  . CHOLECYSTECTOMY  1978  . GASTRIC BYPASS  2002  . SPLENECTOMY  2002  . TONSILLECTOMY  1963     Current Outpatient Prescriptions  Medication Sig Dispense Refill  . albuterol (PROVENTIL HFA;VENTOLIN HFA) 108 (90 BASE) MCG/ACT inhaler Inhale 2 puffs into the lungs every 4 (four) hours as needed for wheezing or shortness of breath. Reported on 11/22/2015    . albuterol (PROVENTIL) (2.5 MG/3ML) 0.083% nebulizer solution Take 3 mLs (2.5 mg total) by nebulization every 6 (six) hours as needed for wheezing or shortness of breath. 150 mL 1  . ALPRAZolam (XANAX) 0.5 MG tablet TAKE 1 TABLET BY MOUTH AT BEDTIME AS NEEDED FOR ANXIETY 45 tablet 1  . amoxicillin-clavulanate (AUGMENTIN) 875-125 MG tablet Take 1 tablet by mouth 2 (two) times daily.    Marland Kitchen apixaban (ELIQUIS) 5 MG TABS tablet Take 1 tablet (5 mg total) by mouth 2 (two) times daily. 60 tablet 6  . diltiazem  (CARDIZEM CD) 180 MG 24 hr capsule Take 1 capsule (180 mg total) by mouth daily. 30 capsule 6  . DULoxetine (CYMBALTA) 60 MG capsule TAKE 1 CAPSULE (60 MG TOTAL) BY MOUTH DAILY. 30 capsule 0  . ferrous sulfate 325 (65 FE) MG tablet Take 325 mg by mouth 3 (three) times a week.    . fluticasone (FLONASE) 50 MCG/ACT nasal spray Place 2 sprays into both nostrils daily. 16 g 5  . furosemide (LASIX) 20 MG tablet TAKE 1 TABLET (20 MG TOTAL) BY MOUTH 2 (TWO) TIMES DAILY. 60 tablet 3  . gabapentin (NEURONTIN) 100 MG capsule Take 100 mg by mouth 2 (two) times daily.    . halobetasol (ULTRAVATE) 0.05 % cream APPLY TOPICALLY 2 (TWO) TIMES DAILY. 50 g 2  . omeprazole (PRILOSEC) 40 MG capsule Take 1 capsule (40 mg total) by mouth daily. Open capsule and sprinkle on pudding, apple sauce or something soft.  One every am on an empty stomach 20 -30 minutes prior to breakfast. 30 capsule 5  . saccharomyces boulardii (FLORASTOR) 250 MG capsule Take 1 capsule (250 mg total) by mouth 2 (two) times daily. 60 capsule 6  . Vitamin D, Ergocalciferol, (DRISDOL) 50000 units CAPS capsule TAKE 1 CAPSULE (50,000 UNITS TOTAL)  BY MOUTH EVERY 7 (SEVEN) DAYS. (Patient taking differently: TAKE 1 CAPSULE (50,000 UNITS TOTAL) BY MOUTH EVERY 7 (SEVEN) DAYS. (Wednesday)) 12 capsule 0   No current facility-administered medications for this visit.     Allergies:   Codeine    Social History:  The patient  reports that she quit smoking about 23 years ago. She has never used smokeless tobacco. She reports that she does not drink alcohol or use drugs.   Family History:  The patient's family history includes Alzheimer's disease in her mother; Arthritis in her father; Cancer in her mother; Colon cancer (age of onset: 15) in her paternal uncle.    ROS:  Please see the history of present illness.    Review of Systems: Constitutional:  denies fever, chills, diaphoresis, appetite change and fatigue.  HEENT: denies photophobia, eye pain,  redness, hearing loss, ear pain, congestion, sore throat, rhinorrhea, sneezing, neck pain, neck stiffness and tinnitus.  Respiratory: denies SOB, DOE, cough, chest tightness, and wheezing.  Cardiovascular: denies chest pain, palpitations and leg swelling.  Gastrointestinal: denies nausea, vomiting, abdominal pain, diarrhea, constipation, blood in stool.  Genitourinary: denies dysuria, urgency, frequency, hematuria, flank pain and difficulty urinating.  Musculoskeletal: denies  myalgias, back pain, joint swelling, arthralgias and gait problem.   Skin: denies pallor, rash and wound.  Neurological: denies dizziness, seizures, syncope, weakness, light-headedness, numbness and headaches.   Hematological: denies adenopathy, easy bruising, personal or family bleeding history.  Psychiatric/ Behavioral: denies suicidal ideation, mood changes, confusion, nervousness, sleep disturbance and agitation.       All other systems are reviewed and negative.    PHYSICAL EXAM: VS:  BP 120/84   Pulse 95   Ht 5\' 4"  (1.626 m)   Wt 182 lb 9.6 oz (82.8 kg)   BMI 31.34 kg/m  , BMI Body mass index is 31.34 kg/m. GEN: Well nourished, well developed, in no acute distress  HEENT: normal  Neck: no JVD, carotid bruits, or masses Cardiac: RRR; no murmurs, rubs, or gallops,no edema  Respiratory:  clear to auscultation bilaterally, normal work of breathing GI: soft, nontender, nondistended, + BS MS: no deformity or atrophy  Skin: warm and dry, no rash Neuro:  Strength and sensation are intact Psych: normal   EKG:  EKG is not ordered today.    Recent Labs: 06/12/2016: ALT 17; Magnesium 2.1; TSH 7.795 06/13/2016: BUN 13; Creatinine, Ser 0.94; Hemoglobin 13.2; Platelets 343; Potassium 4.0; Sodium 140    Lipid Panel    Component Value Date/Time   CHOL 219 (H) 10/19/2015 1105   TRIG 101 10/19/2015 1105   HDL 71 10/19/2015 1105   CHOLHDL 3.1 10/19/2015 1105   LDLCALC 128 (H) 10/19/2015 1105      Wt  Readings from Last 3 Encounters:  06/23/16 182 lb 9.6 oz (82.8 kg)  06/17/16 187 lb (84.8 kg)  06/12/16 186 lb (84.4 kg)      Other studies Reviewed: Additional studies/ records that were reviewed today include: . Review of the above records demonstrates:    ASSESSMENT AND PLAN:  1.  Paroxysmal atrial fib Has converted to NSR Her heart rate is still a little fast. Continue diltiazem. Will add metoprolol 25 BID   We will continue Eliquis. She needs to have her colovaginal fistula repaired. I think will be fine if we schedule her for surgery for 3 weeks. She should be at low to moderate risk for this surgery. She has normal left ventricular systolic function.  2. Hyper lipidemia: She is  been on simvastatin  but had leg cramps. We'll discontinue the simvastatin and start her on atorvastatin 20 mg a day.  Current medicines are reviewed at length with the patient today.  The patient does not have concerns regarding medicines.  Labs/ tests ordered today include:  No orders of the defined types were placed in this encounter.    Disposition:   FU with me in 3 months      Mertie Moores, MD  06/23/2016 10:13 AM    [address], [place], [place]  [address] Phone: [phone]; Fax: [phone]   [facility]  [address] [place], [place]  [address] [phone]   Fax [phone]

## 2016-07-01 ENCOUNTER — Other Ambulatory Visit: Payer: Self-pay | Admitting: General Surgery

## 2016-07-01 DIAGNOSIS — N824 Other female intestinal-genital tract fistulae: Secondary | ICD-10-CM | POA: Diagnosis not present

## 2016-07-01 NOTE — H&P (Signed)
History of Present Illness Sabrina [name]; [id number] 10:06 AM) The patient is a 70 year old female who presents with non-malignant abdominal pain. 70 year old female who presents to the office for evaluation of a recent history of frequent UTIs and passing fecal matter in her urine. She denies any passage of air in her urine. She complains of chronic left-sided abdominal pain that has been present for 3-4 years. Her last colonoscopy was in May 2017 by Dr. Hilarie Fredrickson. There were multiple diverticula but no signs of active diverticulitis. After her most recent episode she underwent a CT scan. This also showed no signs of diverticulitis or acute inflammation. She has a history of multiple abdominal surgeries including appendectomy and cholecystectomy as well as gastric bypass surgery performed in open fashion and revision gastric bypass surgery performed several years ago. CT scan was performed which is concerning for a colo-vaginal fistula. She presented to my office 2 weeks ago with complaints of tachycardia and worsening left lower quadrant pain. I placed her on Augmentin and had her follow-up with her cardiologist. She was found to be in atrial fibrillation and was placed in the hospital. She converted to normal sinus rhythm on IV diltiazem. She is now on Ellquis. She is feeling somewhat better. Her left lower quadrant pain has been rather minimal recently. She is having bowel movements without difficulty.   Problem List/Past Medical Sabrina Ruff, [name]; [id number] 10:07 AM) Sabrina [name] FISTULA (N82.4)  Other Problems Sabrina Ruff, [name]; [id number] 10:07 AM) Hypercholesterolemia Gastroesophageal Reflux Disease Kidney Stone Ulcerative Colitis Cholelithiasis Chest pain Congestive Heart Failure Diverticulosis Crohn's Disease FREQUENT UTI (N39.0) Arthritis Bladder Problems Back Pain  Past Surgical History Sabrina Ruff, [name]; [id number] 10:07 AM) Hip Surgery  Left. Gastric Bypass Hysterectomy (not due to cancer) - Partial Oral Surgery Knee Surgery Right. Gallbladder Surgery - Open Appendectomy Tonsillectomy  Diagnostic Studies History Sabrina Ruff, [name]; [id number] 10:07 AM) Mammogram within last year Colonoscopy never Pap Smear 1-5 years ago  Allergies Elbert Ewings, CMA; 07/01/2016 9:47 AM) Codeine Phosphate *ANALGESICS - OPIOID*  Medication History Sabrina Ruff, [name]; [id number] 10:07 AM) ALPRAZolam (0.5MG  Tablet, Oral) Active. DULoxetine HCl (60MG  Capsule DR Part, Oral) Active. Furosemide (20MG  Tablet, Oral) Active. Gabapentin (100MG  Capsule, Oral) Active. Ketorolac Tromethamine (10MG  Tablet, Oral) Active. Nitrofurantoin Monohyd Macro (100MG  Capsule, Oral) Active. Simvastatin (20MG  Tablet, Oral) Active. Vitamin D (Ergocalciferol) (50000UNIT Capsule, Oral) Active. Medications Reconciled Lipitor (10MG  Tablet, Oral) Active. Eliquis (2.5MG  Tablet, Oral) Active. Cymbalta (20MG  Capsule DR Part, Oral) Active. DilTIAZem HCl (60MG  Tablet, Oral) Active. Neomycin Sulfate (500MG  Tablet, 2 (two) Tablet Oral SEE NOTE, Taken starting 07/01/2016) Active. (TAKE TWO TABLETS AT 2 PM, 3 PM, AND 10 PM THE DAY PRIOR TO SURGERY) Flagyl (500MG  Tablet, 2 (two) Tablet Oral SEE NOTE, Taken starting 07/01/2016) Active. (Take at 2pm, 3pm, and 10pm the day prior to your colon operation)  Social History Sabrina Ruff, [name]; [id number] 10:07 AM) Alcohol use Remotely quit alcohol use. Caffeine use Carbonated beverages, Coffee. Tobacco use Current every day smoker. Illicit drug use Remotely quit drug use.  Family History Sabrina Ruff, [name]; [id number] 10:07 AM) Respiratory Condition Mother. Kidney Disease Mother. Colon Cancer Mother. Arthritis Mother. Depression Mother. Heart disease in female family member before age 28 Heart Disease Mother.  Pregnancy / Birth History Sabrina Ruff, [name]; [id number] 10:07  AM) Maternal age 10-30 Irregular periods Age at menarche 89 years. Gravida 3 Age of menopause 51-55     Review of Systems Sabrina [name]; [id number] 10:08 AM) General Present- Chills, Fatigue, Night Sweats  and Weight Gain. Not Present- Appetite Loss, Fever and Weight Loss. Skin Present- Dryness. Not Present- Change in Wart/Mole, Hives, Jaundice, New Lesions, Non-Healing Wounds, Rash and Ulcer. HEENT Present- Ringing in the Ears, Sore Throat and Wears glasses/contact lenses. Not Present- Earache, Hearing Loss, Hoarseness, Nose Bleed, Oral Ulcers, Seasonal Allergies, Sinus Pain, Visual Disturbances and Yellow Eyes. Cardiovascular Present- Leg Cramps, Shortness of Breath and Swelling of Extremities. Not Present- Chest Pain, Difficulty Breathing Lying Down, Palpitations and Rapid Heart Rate. Gastrointestinal Present- Abdominal Pain, Bloating, Bloody Stool, Change in Bowel Habits, Constipation, Difficulty Swallowing, Excessive gas and Indigestion. Not Present- Chronic diarrhea, Gets full quickly at meals, Hemorrhoids, Nausea, Rectal Pain and Vomiting. Female Genitourinary Present- Frequency, Nocturia, Pelvic Pain and Urgency. Not Present- Painful Urination. Musculoskeletal Present- Back Pain, Joint Pain, Joint Stiffness, Muscle Pain, Muscle Weakness and Swelling of Extremities. Neurological Present- Tremor and Weakness. Not Present- Decreased Memory, Fainting, Headaches, Numbness, Seizures, Tingling and Trouble walking. Psychiatric Present- Anxiety. Not Present- Bipolar, Change in Sleep Pattern, Depression, Fearful and Frequent crying. Endocrine Present- Hair Changes, Heat Intolerance and Hot flashes. Not Present- Cold Intolerance, Excessive Hunger and New Diabetes.  Vitals Elbert Ewings CMA; 07/01/2016 9:48 AM) 07/01/2016 9:48 AM Weight: 187 lb Height: 64in Body Surface Area: 1.9 m Body Mass Index: 32.1 kg/m  Temp.: 98.33F(Temporal)  Pulse: 63 (Regular)  BP: 132/74  (Sitting, Left Arm, Standard)      Physical Exam Sabrina [name]; [id number] 10:08 AM)  General Mental Status-Alert. General Appearance-Not in acute distress. Build & Nutrition-Well nourished. Posture-Normal posture. Gait-Normal.  Head and Neck Head-normocephalic, atraumatic with no lesions or palpable masses. Trachea-midline.  Chest and Lung Exam Chest and lung exam reveals -on auscultation, normal breath sounds, no adventitious sounds and normal vocal resonance.  Cardiovascular Cardiovascular examination reveals -normal heart sounds, regular rate and rhythm with no murmurs.  Abdomen Inspection Inspection of the abdomen reveals - No Hernias. Palpation/Percussion Palpation and Percussion of the abdomen reveal - Soft, No Rigidity (guarding), No hepatosplenomegaly and No Palpable abdominal masses. Tenderness - Note: She has some mild pelvic tenderness, she also has some left costal tenderness.  Neurologic Neurologic evaluation reveals -alert and oriented x 3 with no impairment of recent or remote memory, normal attention span and ability to concentrate, normal sensation and normal coordination.  Musculoskeletal Normal Exam - Bilateral-Upper Extremity Strength Normal and Lower Extremity Strength Normal.    Assessment & Plan Sabrina [name]; [id number] 10:07 AM)  COLOVAGINAL FISTULA (N82.4) Impression: I think she is at a spot where we can start working on scheduling her surgery. Given her history of multiple upper abdominal surgeries, I recommended a laparoscopic approach with possible open pelvic resection. We will touch base with her cardiologist to determine if any bridging is needed for her anticoagulation. The surgery and anatomy were described to the patient as well as the risks of surgery and the possible complications. These include: Bleeding, deep abdominal infections and possible wound complications such as hernia and infection, damage to  adjacent structures, leak of surgical connections, which can lead to other surgeries and possibly an ostomy, possible need for other procedures, such as abscess drains in radiology, possible prolonged hospital stay, possible diarrhea from removal of part of the colon, possible constipation from narcotics, possible bowel, bladder or sexual dysfunction if having rectal surgery, prolonged fatigue/weakness or appetite loss, possible early recurrence of of disease, possible complications of their medical problems such as heart disease or arrhythmias or lung problems, death (less than 1%). I believe the patient  understands and wishes to proceed with the surgery.

## 2016-07-03 ENCOUNTER — Telehealth: Payer: Self-pay | Admitting: Cardiovascular Disease

## 2016-07-03 NOTE — Telephone Encounter (Signed)
New message       Calling to get clarification on surgical clearance.  Is pt staying on eliquis or stopping it prior to surgery?  It is not clear in the ov note.  Please call

## 2016-07-03 NOTE — Telephone Encounter (Signed)
Returned call to [employer] D at [employer]. She was unavailable. I left a message that the clearance document was faxed to their office. Requested a call to our to discuss.

## 2016-08-16 ENCOUNTER — Other Ambulatory Visit: Payer: Self-pay | Admitting: Family

## 2016-08-16 DIAGNOSIS — F411 Generalized anxiety disorder: Secondary | ICD-10-CM

## 2016-08-18 ENCOUNTER — Encounter (HOSPITAL_COMMUNITY): Payer: Self-pay

## 2016-08-18 ENCOUNTER — Encounter (HOSPITAL_COMMUNITY)
Admission: RE | Admit: 2016-08-18 | Discharge: 2016-08-18 | Disposition: A | Discharge status: Home / Self Care | Payer: Commercial Managed Care - HMO | Source: Ambulatory Visit | Attending: General Surgery | Admitting: General Surgery

## 2016-08-18 ENCOUNTER — Encounter (HOSPITAL_COMMUNITY): Payer: Self-pay | Admitting: *Deleted

## 2016-08-18 DIAGNOSIS — Z01812 Encounter for preprocedural laboratory examination: Secondary | ICD-10-CM | POA: Insufficient documentation

## 2016-08-18 DIAGNOSIS — N824 Other female intestinal-genital tract fistulae: Secondary | ICD-10-CM | POA: Diagnosis not present

## 2016-08-18 DIAGNOSIS — Z0183 Encounter for blood typing: Secondary | ICD-10-CM | POA: Diagnosis not present

## 2016-08-18 HISTORY — DX: Essential (primary) hypertension: I10

## 2016-08-18 HISTORY — DX: Other fracture of right lower leg, initial encounter for closed fracture: S82.891A

## 2016-08-18 LAB — BASIC METABOLIC PANEL
Anion gap: 8 (ref 5–15)
BUN: 14 mg/dL (ref 6–20)
CALCIUM: 9.4 mg/dL (ref 8.9–10.3)
CO2: 28 mmol/L (ref 22–32)
CREATININE: 0.86 mg/dL (ref 0.44–1.00)
Chloride: 102 mmol/L (ref 101–111)
GFR calc non Af Amer: >60 mL/min (ref >60)
GLUCOSE: 111 mg/dL — AB (ref 65–99)
Potassium: 5 mmol/L (ref 3.5–5.1)
Sodium: 138 mmol/L (ref 135–145)

## 2016-08-18 LAB — CBC
HCT: 45.5 % (ref 36.0–46.0)
Hemoglobin: 14.9 g/dL (ref 12.0–15.0)
MCH: 30.8 pg (ref 26.0–34.0)
MCHC: 32.7 g/dL (ref 30.0–36.0)
MCV: 94 fL (ref 78.0–100.0)
PLATELETS: 371 10*3/uL (ref 150–400)
RBC: 4.84 MIL/uL (ref 3.87–5.11)
RDW: 12.9 % (ref 11.5–15.5)
WBC: 9.1 10*3/uL (ref 4.0–10.5)

## 2016-08-18 LAB — ABO/RH: ABO/RH(D): A POS

## 2016-08-18 NOTE — Pre-Procedure Instructions (Signed)
EKG, Echo, CXR 9'17 Epic.

## 2016-08-18 NOTE — Patient Instructions (Addendum)
Sabrina [name]  08/18/2016   Your procedure is scheduled on: 12-01- 17  Report to [facility] Main  Entrance take [facility]  elevators to 3rd floor to  [name] at  1000 AM.  Call this number if you have problems the morning of surgery [phone]  Follow bowel prep instructions per MD(drink Clear Liquids plentiful day of bowel prep).  Remember: ONLY 1 PERSON MAY GO WITH YOU TO SHORT STAY TO GET  READY MORNING OF YOUR SURGERY.  Do not eat food or drink liquids :After Midnight.     Take these medicines the morning of surgery with A SIP OF WATER: Augmentin. Diltiazem. Cymbalta. Metoprolol. Omeprazole. Gabapentin. Nasal spray/ Inhalers-if need. DO NOT TAKE ANY DIABETIC MEDICATIONS DAY OF YOUR SURGERY                               You may not have any metal on your body including hair pins and              piercings  Do not wear jewelry, make-up, lotions, powders or perfumes, deodorant             Do not wear nail polish.  Do not shave  48 hours prior to surgery.              Men may shave face and neck.   Do not bring valuables to the hospital. [place].  Contacts, dentures or bridgework may not be worn into surgery.  Leave suitcase in the car. After surgery it may be brought to your room.     Patients discharged the day of surgery will not be allowed to drive home.  Name and phone number of your driver:Sabrina [name]-significant other [phone] cell  Special Instructions: N/A              Please read over the following fact sheets you were given: _____________________________________________________________________             [facility] - Preparing for Surgery Before surgery, you can play an important role.  Because skin is not sterile, your skin needs to be as free of germs as possible.  You can reduce the number of germs on your skin by washing with CHG (chlorahexidine gluconate) soap before surgery.   CHG is an antiseptic cleaner which kills germs and bonds with the skin to continue killing germs even after washing. Please DO NOT use if you have an allergy to CHG or antibacterial soaps.  If your skin becomes reddened/irritated stop using the CHG and inform your nurse when you arrive at Short Stay. Do not shave (including legs and underarms) for at least 48 hours prior to the first CHG shower.  You may shave your face/neck. Please follow these instructions carefully:  1.  Shower with CHG Soap the night before surgery and the  morning of Surgery.  2.  If you choose to wash your hair, wash your hair first as usual with your  normal  shampoo.  3.  After you shampoo, rinse your hair and body thoroughly to remove the  shampoo.  4.  Use CHG as you would any other liquid soap.  You can apply chg directly  to the skin and wash                       Gently with a scrungie or clean washcloth.  5.  Apply the CHG Soap to your body ONLY FROM THE NECK DOWN.   Do not use on face/ open                           Wound or open sores. Avoid contact with eyes, ears mouth and genitals (private parts).                       Wash face,  Genitals (private parts) with your normal soap.             6.  Wash thoroughly, paying special attention to the area where your surgery  will be performed.  7.  Thoroughly rinse your body with warm water from the neck down.  8.  DO NOT shower/wash with your normal soap after using and rinsing off  the CHG Soap.                9.  Pat yourself dry with a clean towel.            10.  Wear clean pajamas.            11.  Place clean sheets on your bed the night of your first shower and do not  sleep with pets. Day of Surgery : Do not apply any lotions/deodorants the morning of surgery.  Please wear clean clothes to the hospital/surgery center.  FAILURE TO FOLLOW THESE INSTRUCTIONS MAY RESULT IN THE CANCELLATION OF YOUR SURGERY PATIENT  SIGNATURE_________________________________  NURSE SIGNATURE__________________________________  ________________________________________________________________________

## 2016-08-19 LAB — HEMOGLOBIN A1C
HEMOGLOBIN A1C: 5.7 % — AB (ref 4.8–5.6)
Mean Plasma Glucose: 117 mg/dL

## 2016-08-22 ENCOUNTER — Ambulatory Visit (HOSPITAL_COMMUNITY)
Admission: RE | Admit: 2016-08-22 | Discharge: 2016-08-22 | Disposition: A | Discharge status: Home / Self Care | Payer: Commercial Managed Care - HMO | Source: Ambulatory Visit | Attending: General Surgery | Admitting: General Surgery

## 2016-08-22 ENCOUNTER — Inpatient Hospital Stay (HOSPITAL_COMMUNITY): Payer: Commercial Managed Care - HMO | Admitting: Certified Registered Nurse Anesthetist

## 2016-08-22 ENCOUNTER — Encounter (HOSPITAL_COMMUNITY): Payer: Self-pay | Admitting: *Deleted

## 2016-08-22 ENCOUNTER — Encounter (HOSPITAL_COMMUNITY)
Admission: RE | Disposition: A | Discharge status: Home / Self Care | Payer: Self-pay | Source: Ambulatory Visit | Attending: General Surgery

## 2016-08-22 DIAGNOSIS — I4891 Unspecified atrial fibrillation: Secondary | ICD-10-CM | POA: Diagnosis not present

## 2016-08-22 DIAGNOSIS — K5730 Diverticulosis of large intestine without perforation or abscess without bleeding: Secondary | ICD-10-CM | POA: Diagnosis not present

## 2016-08-22 DIAGNOSIS — K5190 Ulcerative colitis, unspecified, without complications: Secondary | ICD-10-CM | POA: Diagnosis not present

## 2016-08-22 DIAGNOSIS — J45909 Unspecified asthma, uncomplicated: Secondary | ICD-10-CM | POA: Insufficient documentation

## 2016-08-22 DIAGNOSIS — R159 Full incontinence of feces: Secondary | ICD-10-CM | POA: Insufficient documentation

## 2016-08-22 DIAGNOSIS — K632 Fistula of intestine: Secondary | ICD-10-CM | POA: Diagnosis present

## 2016-08-22 DIAGNOSIS — N816 Rectocele: Secondary | ICD-10-CM | POA: Insufficient documentation

## 2016-08-22 DIAGNOSIS — K219 Gastro-esophageal reflux disease without esophagitis: Secondary | ICD-10-CM | POA: Insufficient documentation

## 2016-08-22 DIAGNOSIS — Z87891 Personal history of nicotine dependence: Secondary | ICD-10-CM | POA: Diagnosis not present

## 2016-08-22 DIAGNOSIS — M1990 Unspecified osteoarthritis, unspecified site: Secondary | ICD-10-CM | POA: Insufficient documentation

## 2016-08-22 DIAGNOSIS — I509 Heart failure, unspecified: Secondary | ICD-10-CM | POA: Insufficient documentation

## 2016-08-22 DIAGNOSIS — Z90711 Acquired absence of uterus with remaining cervical stump: Secondary | ICD-10-CM | POA: Insufficient documentation

## 2016-08-22 DIAGNOSIS — R1084 Generalized abdominal pain: Secondary | ICD-10-CM | POA: Diagnosis not present

## 2016-08-22 DIAGNOSIS — I083 Combined rheumatic disorders of mitral, aortic and tricuspid valves: Secondary | ICD-10-CM | POA: Diagnosis not present

## 2016-08-22 DIAGNOSIS — Z9884 Bariatric surgery status: Secondary | ICD-10-CM | POA: Diagnosis not present

## 2016-08-22 DIAGNOSIS — E7800 Pure hypercholesterolemia, unspecified: Secondary | ICD-10-CM | POA: Diagnosis not present

## 2016-08-22 DIAGNOSIS — I5032 Chronic diastolic (congestive) heart failure: Secondary | ICD-10-CM | POA: Diagnosis not present

## 2016-08-22 DIAGNOSIS — Z885 Allergy status to narcotic agent status: Secondary | ICD-10-CM | POA: Diagnosis not present

## 2016-08-22 DIAGNOSIS — I110 Hypertensive heart disease with heart failure: Secondary | ICD-10-CM | POA: Insufficient documentation

## 2016-08-22 HISTORY — PX: LAPAROSCOPY: SHX197

## 2016-08-22 LAB — TYPE AND SCREEN
ABO/RH(D): A POS
Antibody Screen: NEGATIVE

## 2016-08-22 SURGERY — LAPAROSCOPY, DIAGNOSTIC
Anesthesia: General | Site: Rectum

## 2016-08-22 MED ORDER — TRAMADOL HCL 50 MG PO TABS
50.0000 mg | ORAL_TABLET | Freq: Four times a day (QID) | ORAL | Status: DC | PRN
Start: 2016-08-22 — End: 2016-08-22

## 2016-08-22 MED ORDER — PROPOFOL 10 MG/ML IV BOLUS
INTRAVENOUS | Status: DC | PRN
  Administered 2016-08-22: 100 mg via INTRAVENOUS
  Administered 2016-08-22: 40 mg via INTRAVENOUS

## 2016-08-22 MED ORDER — FENTANYL CITRATE (PF) 100 MCG/2ML IJ SOLN
INTRAMUSCULAR | Status: DC | PRN
  Administered 2016-08-22 (×2): 50 ug via INTRAVENOUS

## 2016-08-22 MED ORDER — SODIUM CHLORIDE 0.9% FLUSH: 3.0000 mL | Freq: Two times a day (BID) | INTRAVENOUS | Status: DC

## 2016-08-22 MED ORDER — HEPARIN SODIUM (PORCINE) 5000 UNIT/ML IJ SOLN
5000.0000 [IU] | Freq: Once | INTRAMUSCULAR | Status: DC
  Filled 2016-08-22: qty 1

## 2016-08-22 MED ORDER — METHYLENE BLUE 0.5 % INJ SOLN
INTRAVENOUS | Status: AC
  Filled 2016-08-22: qty 10

## 2016-08-22 MED ORDER — 0.9 % SODIUM CHLORIDE (POUR BTL) OPTIME
TOPICAL | Status: DC | PRN
  Administered 2016-08-22: 2000 mL

## 2016-08-22 MED ORDER — FENTANYL CITRATE (PF) 100 MCG/2ML IJ SOLN: 25.0000 ug | INTRAMUSCULAR | Status: DC | PRN

## 2016-08-22 MED ORDER — ROCURONIUM BROMIDE 10 MG/ML (PF) SYRINGE
PREFILLED_SYRINGE | INTRAVENOUS | Status: DC | PRN
  Administered 2016-08-22: 20 mg via INTRAVENOUS
  Administered 2016-08-22: 50 mg via INTRAVENOUS

## 2016-08-22 MED ORDER — ROCURONIUM BROMIDE 50 MG/5ML IV SOSY
PREFILLED_SYRINGE | INTRAVENOUS | Status: AC
  Filled 2016-08-22: qty 10

## 2016-08-22 MED ORDER — PHENYLEPHRINE HCL 10 MG/ML IJ SOLN
INTRAMUSCULAR | Status: AC
  Filled 2016-08-22: qty 1

## 2016-08-22 MED ORDER — BUPIVACAINE HCL (PF) 0.25 % IJ SOLN
INTRAMUSCULAR | Status: DC | PRN
Start: 2016-08-22 — End: 2016-08-22
  Administered 2016-08-22: 20 mL

## 2016-08-22 MED ORDER — TRAMADOL HCL 50 MG PO TABS: 50.0000 mg | ORAL_TABLET | Freq: Four times a day (QID) | ORAL | 0 refills | Status: DC | PRN

## 2016-08-22 MED ORDER — ALVIMOPAN 12 MG PO CAPS
12.0000 mg | ORAL_CAPSULE | Freq: Once | ORAL | Status: AC
  Administered 2016-08-22: 12 mg via ORAL
  Filled 2016-08-22: qty 1

## 2016-08-22 MED ORDER — MIDAZOLAM HCL 5 MG/5ML IJ SOLN
INTRAMUSCULAR | Status: DC | PRN
  Administered 2016-08-22: 2 mg via INTRAVENOUS

## 2016-08-22 MED ORDER — FENTANYL CITRATE (PF) 250 MCG/5ML IJ SOLN
INTRAMUSCULAR | Status: AC
  Filled 2016-08-22: qty 5

## 2016-08-22 MED ORDER — SODIUM CHLORIDE 0.9 % IV SOLN
INTRAVENOUS | Status: DC
  Administered 2016-08-22: 16:00:00 via INTRAVENOUS

## 2016-08-22 MED ORDER — PROPOFOL 10 MG/ML IV BOLUS
INTRAVENOUS | Status: AC
  Filled 2016-08-22: qty 20

## 2016-08-22 MED ORDER — MIDAZOLAM HCL 2 MG/2ML IJ SOLN
INTRAMUSCULAR | Status: AC
  Filled 2016-08-22: qty 2

## 2016-08-22 MED ORDER — CEFOTETAN DISODIUM-DEXTROSE 2-2.08 GM-% IV SOLR
INTRAVENOUS | Status: AC
  Filled 2016-08-22: qty 50

## 2016-08-22 MED ORDER — SODIUM CHLORIDE 0.9% FLUSH: 3.0000 mL | INTRAVENOUS | Status: DC | PRN

## 2016-08-22 MED ORDER — ACETAMINOPHEN 650 MG RE SUPP
650.0000 mg | RECTAL | Status: DC | PRN
  Filled 2016-08-22: qty 1

## 2016-08-22 MED ORDER — ONDANSETRON HCL 4 MG/2ML IJ SOLN
INTRAMUSCULAR | Status: DC | PRN
  Administered 2016-08-22: 4 mg via INTRAVENOUS

## 2016-08-22 MED ORDER — BUPIVACAINE HCL (PF) 0.25 % IJ SOLN
INTRAMUSCULAR | Status: AC
  Filled 2016-08-22: qty 30

## 2016-08-22 MED ORDER — SODIUM CHLORIDE 0.9 % IV SOLN: 250.0000 mL | INTRAVENOUS | Status: DC | PRN

## 2016-08-22 MED ORDER — OXYCODONE HCL 5 MG PO TABS: 5.0000 mg | ORAL_TABLET | Freq: Once | ORAL | Status: DC | PRN

## 2016-08-22 MED ORDER — PHENYLEPHRINE 40 MCG/ML (10ML) SYRINGE FOR IV PUSH (FOR BLOOD PRESSURE SUPPORT)
PREFILLED_SYRINGE | INTRAVENOUS | Status: DC | PRN
  Administered 2016-08-22: 80 ug via INTRAVENOUS
  Administered 2016-08-22: 120 ug via INTRAVENOUS
  Administered 2016-08-22: 80 ug via INTRAVENOUS
  Administered 2016-08-22: 40 ug via INTRAVENOUS

## 2016-08-22 MED ORDER — PHENYLEPHRINE 40 MCG/ML (10ML) SYRINGE FOR IV PUSH (FOR BLOOD PRESSURE SUPPORT)
PREFILLED_SYRINGE | INTRAVENOUS | Status: AC
  Filled 2016-08-22: qty 10

## 2016-08-22 MED ORDER — LACTATED RINGERS IR SOLN
Status: DC | PRN
  Administered 2016-08-22: 3000 mL

## 2016-08-22 MED ORDER — DEXTROSE 5 % IV SOLN
2.0000 g | INTRAVENOUS | Status: AC
  Administered 2016-08-22: 2 g via INTRAVENOUS
  Filled 2016-08-22: qty 2

## 2016-08-22 MED ORDER — BUPIVACAINE LIPOSOME 1.3 % IJ SUSP
20.0000 mL | Freq: Once | INTRAMUSCULAR | Status: AC
  Administered 2016-08-22: 20 mL
  Filled 2016-08-22: qty 20

## 2016-08-22 MED ORDER — OXYCODONE HCL 5 MG/5ML PO SOLN
5.0000 mg | Freq: Once | ORAL | Status: DC | PRN
  Filled 2016-08-22: qty 5

## 2016-08-22 MED ORDER — ACETAMINOPHEN 500 MG PO TABS
1000.0000 mg | ORAL_TABLET | ORAL | Status: AC
  Administered 2016-08-22: 1000 mg via ORAL
  Filled 2016-08-22: qty 2

## 2016-08-22 MED ORDER — OXYCODONE HCL 5 MG PO TABS: 5.0000 mg | ORAL_TABLET | ORAL | Status: DC | PRN

## 2016-08-22 MED ORDER — SUGAMMADEX SODIUM 200 MG/2ML IV SOLN
INTRAVENOUS | Status: DC | PRN
  Administered 2016-08-22: 175 mg via INTRAVENOUS

## 2016-08-22 MED ORDER — METOPROLOL TARTRATE 25 MG PO TABS
25.0000 mg | ORAL_TABLET | Freq: Once | ORAL | Status: AC
  Administered 2016-08-22: 25 mg via ORAL
  Filled 2016-08-22: qty 1

## 2016-08-22 MED ORDER — PHENYLEPHRINE HCL 10 MG/ML IJ SOLN
INTRAVENOUS | Status: DC | PRN
  Administered 2016-08-22: 25 ug/min via INTRAVENOUS

## 2016-08-22 MED ORDER — LIDOCAINE 2% (20 MG/ML) 5 ML SYRINGE
INTRAMUSCULAR | Status: DC | PRN
  Administered 2016-08-22: 40 mg via INTRAVENOUS

## 2016-08-22 MED ORDER — LACTATED RINGERS IV SOLN
INTRAVENOUS | Status: DC
  Administered 2016-08-22: 11:00:00 via INTRAVENOUS
  Administered 2016-08-22: 1000 mL via INTRAVENOUS
  Administered 2016-08-22: 09:00:00 via INTRAVENOUS

## 2016-08-22 MED ORDER — GABAPENTIN 300 MG PO CAPS
300.0000 mg | ORAL_CAPSULE | ORAL | Status: DC
  Filled 2016-08-22: qty 1

## 2016-08-22 MED ORDER — ACETAMINOPHEN 325 MG PO TABS: 650.0000 mg | ORAL_TABLET | ORAL | Status: DC | PRN

## 2016-08-22 SURGICAL SUPPLY — 66 items
APPLIER CLIP 5 13 M/L LIGAMAX5 (MISCELLANEOUS)
BLADE EXTENDED COATED 6.5IN (ELECTRODE) IMPLANT
CABLE HIGH FREQUENCY MONO STRZ (ELECTRODE) ×5 IMPLANT
CELLS DAT CNTRL 66122 CELL SVR (MISCELLANEOUS) IMPLANT
CHLORAPREP W/TINT 26ML (MISCELLANEOUS) IMPLANT
CLIP APPLIE 5 13 M/L LIGAMAX5 (MISCELLANEOUS) IMPLANT
COUNTER NEEDLE 20 DBL MAG RED (NEEDLE) ×5 IMPLANT
COVER MAYO STAND STRL (DRAPES) ×15 IMPLANT
COVER SURGICAL LIGHT HANDLE (MISCELLANEOUS) ×10 IMPLANT
DECANTER SPIKE VIAL GLASS SM (MISCELLANEOUS) IMPLANT
DERMABOND ADVANCED (GAUZE/BANDAGES/DRESSINGS) ×2
DERMABOND ADVANCED .7 DNX12 (GAUZE/BANDAGES/DRESSINGS) ×3 IMPLANT
DRAIN CHANNEL 19F RND (DRAIN) IMPLANT
DRAPE LAPAROSCOPIC ABDOMINAL (DRAPES) ×5 IMPLANT
DRSG OPSITE POSTOP 4X10 (GAUZE/BANDAGES/DRESSINGS) IMPLANT
DRSG OPSITE POSTOP 4X6 (GAUZE/BANDAGES/DRESSINGS) IMPLANT
DRSG OPSITE POSTOP 4X8 (GAUZE/BANDAGES/DRESSINGS) IMPLANT
ELECT PENCIL ROCKER SW 15FT (MISCELLANEOUS) IMPLANT
ELECT REM PT RETURN 15FT ADLT (MISCELLANEOUS) ×5 IMPLANT
EVACUATOR SILICONE 100CC (DRAIN) IMPLANT
GAUZE SPONGE 4X4 12PLY STRL (GAUZE/BANDAGES/DRESSINGS) IMPLANT
GLOVE BIO SURGEON STRL SZ 6.5 (GLOVE) ×8 IMPLANT
GLOVE BIO SURGEONS STRL SZ 6.5 (GLOVE) ×2
GLOVE BIOGEL PI IND STRL 7.0 (GLOVE) ×6 IMPLANT
GLOVE BIOGEL PI INDICATOR 7.0 (GLOVE) ×4
GOWN STRL REUS W/TWL 2XL LVL3 (GOWN DISPOSABLE) ×10 IMPLANT
GOWN STRL REUS W/TWL XL LVL3 (GOWN DISPOSABLE) ×20 IMPLANT
GRASPER ENDOPATH ANVIL 10MM (MISCELLANEOUS) IMPLANT
HOLDER FOLEY CATH W/STRAP (MISCELLANEOUS) ×10 IMPLANT
IRRIG SUCT STRYKERFLOW 2 WTIP (MISCELLANEOUS) ×5
IRRIGATION SUCT STRKRFLW 2 WTP (MISCELLANEOUS) ×3 IMPLANT
LEGGING LITHOTOMY PAIR STRL (DRAPES) ×5 IMPLANT
LUBRICANT JELLY K Y 4OZ (MISCELLANEOUS) ×5 IMPLANT
PACK COLON (CUSTOM PROCEDURE TRAY) ×5 IMPLANT
PAD POSITIONING PINK XL (MISCELLANEOUS) ×5 IMPLANT
PORT LAP GEL ALEXIS MED 5-9CM (MISCELLANEOUS) IMPLANT
POSITIONER SURGICAL ARM (MISCELLANEOUS) ×10 IMPLANT
RTRCTR WOUND ALEXIS 18CM MED (MISCELLANEOUS)
SCISSORS LAP 5X35 DISP (ENDOMECHANICALS) ×5 IMPLANT
SEALER TISSUE G2 STRG ARTC 35C (ENDOMECHANICALS) IMPLANT
SEALER TISSUE X1 CVD JAW (INSTRUMENTS) IMPLANT
SLEEVE XCEL OPT CAN 5 100 (ENDOMECHANICALS) IMPLANT
STAPLER VISISTAT 35W (STAPLE) IMPLANT
SUT ETHILON 2 0 PS N (SUTURE) IMPLANT
SUT NOVA NAB DX-16 0-1 5-0 T12 (SUTURE) IMPLANT
SUT PDS AB 1 CTX 36 (SUTURE) IMPLANT
SUT PDS AB 1 TP1 96 (SUTURE) IMPLANT
SUT PROLENE 2 0 KS (SUTURE) ×5 IMPLANT
SUT SILK 2 0 (SUTURE) ×2
SUT SILK 2 0 SH CR/8 (SUTURE) ×5 IMPLANT
SUT SILK 2-0 18XBRD TIE 12 (SUTURE) ×3 IMPLANT
SUT SILK 3 0 (SUTURE) ×2
SUT SILK 3 0 SH CR/8 (SUTURE) ×5 IMPLANT
SUT SILK 3-0 18XBRD TIE 12 (SUTURE) ×3 IMPLANT
SUT VIC AB 2-0 SH 18 (SUTURE) ×5 IMPLANT
SUT VIC AB 4-0 PS2 27 (SUTURE) ×5 IMPLANT
SYS LAPSCP GELPORT 120MM (MISCELLANEOUS)
SYSTEM LAPSCP GELPORT 120MM (MISCELLANEOUS) IMPLANT
TOWEL OR NON WOVEN STRL DISP B (DISPOSABLE) ×5 IMPLANT
TRAY FOLEY CATH 14FRSI W/METER (CATHETERS) ×5 IMPLANT
TRAY FOLEY W/METER SILVER 16FR (SET/KITS/TRAYS/PACK) IMPLANT
TROCAR BLADELESS OPT 5 100 (ENDOMECHANICALS) ×5 IMPLANT
TROCAR XCEL BLUNT TIP 100MML (ENDOMECHANICALS) ×5 IMPLANT
TUBING CONNECTING 10 (TUBING) IMPLANT
TUBING CONNECTING 10' (TUBING)
TUBING INSUF HEATED (TUBING) ×5 IMPLANT

## 2016-08-22 NOTE — Op Note (Signed)
08/22/2016  12:04 PM  PATIENT:  Sabrina [name]  70 y.o. female  Patient Care Team: Sharion Balloon, FNP as PCP - General (Family Medicine)  PRE-OPERATIVE DIAGNOSIS:  COLOVAGINAL FISTULA  POST-OPERATIVE DIAGNOSIS:  Rectocele, fecal incontinence   PROCEDURE:  Diagnostic laparoscopy, rectal exam under anesthesia with rigid proctoscopy   Surgeon(s): [name], MD Fanny Skates, MD  ASSISTANT: Dr Dalbert Batman   ANESTHESIA:   local and general  EBL: 40ml  Total I/O In: 1000 [I.V.:1000] Out: 85 [Urine:75; Blood:10]  DRAINS: none   SPECIMEN:  No Specimen  DISPOSITION OF SPECIMEN:  N/A  COUNTS:  YES  PLAN OF CARE: will assess once Pt is awake  PATIENT DISPOSITION:  PACU - hemodynamically stable.  INDICATION: 70 year old female who presents to the office with complaints of passing fecal matter in her urine. She also has some left-sided abdominal pain has been present for 3-4 years. Her colonoscopy in May showed multiple diverticula but no signs of diverticulitis.  CT scan with rectal contrast was performed and this showed air between the rectum and the vagina concerning for a colovaginal or rectovaginal fistula. Given the symptoms and findings, it was thought that she would be a good candidate for surgical resection.   OR FINDINGS: No colovaginal fistula, large rectocele present, minimal sphincter tone  DESCRIPTION: the patient was identified in the preoperative holding area and taken to the OR where they were laid supine on the operating room table.  General anesthesia was induced without difficulty. SCDs were also noted to be in place prior to the initiation of anesthesia. A Foley catheter was then inserted under sterile conditions and the patient was placed in lithotomy position. The patient was then prepped and draped in the usual sterile fashion.   A surgical timeout was performed indicating the correct patient, procedure, positioning and need for preoperative antibiotics.  I  placed a incision in the previous umbilical scar using a 15 blade scalpel. Dissection was carried down through subcutaneous tissues bluntly. The fascia was identified and elevated on 2 Kocher clamps. It was incised at midline. The peritoneum was entered bluntly with a Kelly clamp. There was significant omental adhesions to the abdominal wall. These were swept down using blunt dissection. A Hassan port was placed and sutured into position with a 0 Vicryl suture.  I then placed another port in the patient's right lower quadrant under direct Lepper scrub visualization.  Upon entering the abdomen. The sigmoid colon was free from inflammation and had no signs of diverticulitis. There were many diverticula present in the sigmoid colon. There was no signs of active diverticulitis. The sigmoid colon was rather redundant throughout the pelvis. This was elevated out of the pelvis and the patient was placed in Trendelenburg position.  There was no signs of pelvic inflammation. The vaginal cuff was identified and was not adherent to the rectal wall. Bimanual exam of the rectum and vagina showed no signs of inflammatory condition.  We concluded that there was not any colovaginal or proximal rectal vaginal fistula. I evaluated the upper quadrants of the abdomen. There was significant small bowel adhesions to the liver, and therefore I could not visualize the liver or anything above that level. I did not take down these adhesions. I then evaluated the left upper quadrant. There was still significant small bowel adhesions but I was able to evaluate the level of the area where the patient had noted to have left upper quadrant pain, as she pointed this area out to me  in the preoperative bay. There was no signs of pathology on the inside the could be related this pain. There was no signs of bowel obstruction. The abdomen was then irrigated with normal saline.  The fascia was then closed with the previously placed 0 Vicryl suture as  well as another one in the proximal portion of the incision. The subcutaneous tissue was closed using interrupted 2-0 Vicryl suture. The skin of both incisions was closed using a 4-0 Vicryl subcuticular suture. Dermabond was placed over the incisions.  I decided to perform an anal exam under anesthesia and rule out any distal rectovaginal fistula. Upon initial evaluation the patient had a significant rectocele and decreased rectal tone. I evaluated the posterior vaginal wall and no signs of inflammation could be noted except for at the very distal and it appeared to have 2 inflammatory polyps. These were probed and did not appear to be the source of a rectal fistula.  Lastly I irrigated the rectum with some methylene blue tinted saline. I placed a sponge in the vagina. There was no leak of contrast into the vagina. I concluded that the patient did not have a colovaginal or rectovaginal fistula. I think the source of her fecalurea is most likely leakage from either her rectocele or decreased sphincter hypertension.  The rectum was irrigated with saline and a rigid proctoscope was inserted to approximately 15 cm. There was no sign of any pathology noted within the rectum. At this point the patient was awakened from anesthesia and sent to the postanesthesia carried in stable condition. All counts were correct per operating room staff.

## 2016-08-22 NOTE — Discharge Instructions (Signed)
GENERAL SURGERY: POST OP INSTRUCTIONS ° °1. DIET: Follow a light bland diet the first 24 hours after arrival home, such as soup, liquids, crackers, etc.  Be sure to include lots of fluids daily.  Avoid fast food or heavy meals as your are more likely to get nauseated.   °2. Take your usually prescribed home medications unless otherwise directed. °3. PAIN CONTROL: °a. Pain is best controlled by a usual combination of three different methods TOGETHER: °i. Ice/Heat °ii. Over the counter pain medication °iii. Prescription pain medication °b. Most patients will experience some swelling and bruising around the incisions.  Ice packs or heating pads (30-60 minutes up to 6 times a day) will help. Use ice for the first few days to help decrease swelling and bruising, then switch to heat to help relax tight/sore spots and speed recovery.  Some people prefer to use ice alone, heat alone, alternating between ice & heat.  Experiment to what works for you.  Swelling and bruising can take several weeks to resolve.   °c. It is helpful to take an over-the-counter pain medication regularly for the first few weeks.  Choose one of the following that works best for you: °i. Naproxen (Aleve, etc)  Two 220mg tabs twice a day °ii. Ibuprofen (Advil, etc) Three 200mg tabs four times a day (every meal & bedtime) °d. A  prescription for pain medication (such as Percocet, oxycodone, hydrocodone, etc) should be given to you upon discharge.  Take your pain medication as prescribed.  °i. If you are having problems/concerns with the prescription medicine (does not control pain, nausea, vomiting, rash, itching, etc), please call us [phone] to see if we need to switch you to a different pain medicine that will work better for you and/or control your side effect better. °ii. If you need a refill on your pain medication, please contact your pharmacy.  They will contact our office to request authorization. Prescriptions will not be filled after 5  pm or on week-ends. °4. Avoid getting constipated.  Between the surgery and the pain medications, it is common to experience some constipation.  Increasing fluid intake and taking a fiber supplement (such as Metamucil, Citrucel, FiberCon, MiraLax, etc) 1-2 times a day regularly will usually help prevent this problem from occurring.  A mild laxative (prune juice, Milk of Magnesia, MiraLax, etc) should be taken according to package directions if there are no bowel movements after 48 hours.   °5. Wash / shower every day.  You may shower over the dressings as they are waterproof.  Continue to shower over incision(s) after the dressing is off. °6. Remove your waterproof bandages 5 days after surgery.  You may leave the incision open to air.  You may have skin tapes (Steri Strips) covering the incision(s).  Leave them on until one week, then remove.  You may replace a dressing/Band-Aid to cover the incision for comfort if you wish.  ° ° ° ° °7. ACTIVITIES as tolerated:   °a. You may resume regular (light) daily activities beginning the next day--such as daily self-care, walking, climbing stairs--gradually increasing activities as tolerated.  If you can walk 30 minutes without difficulty, it is safe to try more intense activity such as jogging, treadmill, bicycling, low-impact aerobics, swimming, etc. °b. Save the most intensive and strenuous activity for last such as sit-ups, heavy lifting, contact sports, etc  Refrain from any heavy lifting or straining until you are off narcotics for pain control.   °c. DO NOT PUSH   THROUGH PAIN.  Let pain be your guide: If it hurts to do something, don't do it.  Pain is your body warning you to avoid that activity for another week until the pain goes down. °d. You may drive when you are no longer taking prescription pain medication, you can comfortably wear a seatbelt, and you can safely maneuver your car and apply brakes. °e. You may have sexual intercourse when it is comfortable.   °8. FOLLOW UP in our office °a. Please call [facility] at [phone] to set up an appointment to see your surgeon in the office for a follow-up appointment approximately 2-3 weeks after your surgery. °b. Make sure that you call for this appointment the day you arrive home to insure a convenient appointment time. °9. IF YOU HAVE DISABILITY OR FAMILY LEAVE FORMS, BRING THEM TO THE OFFICE FOR PROCESSING.  DO NOT GIVE THEM TO YOUR DOCTOR. ° ° °WHEN TO CALL US [phone]: °1. Poor pain control °2. Reactions / problems with new medications (rash/itching, nausea, etc)  °3. Fever over 101.5 F (38.5 C) °4. Worsening swelling or bruising °5. Continued bleeding from incision. °6. Increased pain, redness, or drainage from the incision ° ° The clinic staff is available to answer your questions during regular business hours (8:30am-5pm).  Please don’t hesitate to call and ask to speak to one of our nurses for clinical concerns.  ° If you have a medical emergency, go to the nearest emergency room or call 911. ° A surgeon from [facility] is always on call at the hospitals ° ° °[facility] °[address], [address], [place], [place]  [address] ? °MAIN: [phone] ? TOLL FREE: [phone] ?  °FAX [phone] °[email or web address] ° ° °

## 2016-08-22 NOTE — Progress Notes (Signed)
Dr. Ermalene Postin notified of vs, pt. Feeling better, order received to discharge to home

## 2016-08-22 NOTE — Anesthesia Postprocedure Evaluation (Signed)
Anesthesia Post Note  Patient: Sabrina [name]  Procedure(s) Performed: Procedure(s): LAPAROSCOPY DIAGNOSTIC EXAM UNDER ANESTHESIA  Patient location during evaluation: PACU Anesthesia Type: General Level of consciousness: awake Pain management: pain level controlled Vital Signs Assessment: post-procedure vital signs reviewed and stable Respiratory status: spontaneous breathing Cardiovascular status: stable Postop Assessment: no signs of nausea or vomiting Anesthetic complications: no    Last Vitals:  Vitals:   08/22/16 1605 08/22/16 1644  BP: 103/74 113/74  Pulse: 80 81  Resp: 18 18  Temp: 36.4 C     Last Pain:  Vitals:   08/22/16 1605  TempSrc: Oral  PainSc: 2                  Aianna Fahs

## 2016-08-22 NOTE — Transfer of Care (Signed)
Immediate Anesthesia Transfer of Care Note  Patient: Sabrina [name]  Procedure(s) Performed: Procedure(s): LAPAROSCOPY DIAGNOSTIC EXAM UNDER ANESTHESIA  Patient Location: PACU  Anesthesia Type:General  Level of Consciousness: awake, alert , oriented and patient cooperative  Airway & Oxygen Therapy: Patient Spontanous Breathing and Patient connected to nasal cannula oxygen  Post-op Assessment: Report given to RN, Post -op Vital signs reviewed and stable and Patient moving all extremities X 4  Post vital signs: Reviewed and stable  Last Vitals:  Vitals:   08/22/16 0743  BP: 113/80  Pulse: 90  Resp: 16  Temp: 36.9 C    Last Pain:  Vitals:   08/22/16 0743  TempSrc: Oral      Patients Stated Pain Goal: 4 ([id number])  Complications: No apparent anesthesia complications

## 2016-08-22 NOTE — Anesthesia Preprocedure Evaluation (Addendum)
Anesthesia Evaluation  Patient identified by MRN, date of birth, ID band Patient awake    Reviewed: Allergy & Precautions, NPO status , Patient's Chart, lab work & pertinent test results  History of Anesthesia Complications Negative for: history of anesthetic complications  Airway Mallampati: II  TM Distance: >3 FB Neck ROM: Full    Dental  (+) Dental Advisory Given   Pulmonary asthma , former smoker,    breath sounds clear to auscultation       Cardiovascular hypertension, Pt. on medications +CHF (diastolic)  + dysrhythmias Atrial Fibrillation  Rhythm:Regular  HLD  TTE 06/13/2016: Study Conclusions  - Left ventricle: The cavity size was normal. There was mild   concentric hypertrophy. Systolic function was normal. The   estimated ejection fraction was in the range of 60% to 65%. Wall   motion was normal; there were no regional wall motion   abnormalities. There was no evidence of elevated ventricular   filling pressure by Doppler parameters. - Aortic valve: Trileaflet; normal thickness leaflets. There was no   regurgitation. - Mitral valve: Structurally normal valve. There was mild   regurgitation. - Left atrium: The atrium was severely dilated. - Right ventricle: The cavity size was normal. Wall thickness was   normal. Systolic function was normal. - Right atrium: The atrium was normal in size. - Tricuspid valve: There was mild regurgitation. - Pulmonic valve: There was no regurgitation. - Pulmonary arteries: Systolic pressure was within the normal   range. - Inferior vena cava: The vessel was normal in size. - Pericardium, extracardiac: There was no pericardial effusion.  Myocardial Perfusion Imaging 07/24/2014: normal   Neuro/Psych PSYCHIATRIC DISORDERS Anxiety Depression Peripheral neuropathy  Neuromuscular disease    GI/Hepatic GERD  Medicated,colovesicular fistula s/p gastric bypass    Endo/Other     Renal/GU      Musculoskeletal  (+) Arthritis , Fibromyalgia -  Abdominal   Peds  Hematology  (+) Blood dyscrasia, anemia ,   Anesthesia Other Findings   Reproductive/Obstetrics                            Anesthesia Physical Anesthesia Plan  ASA: III  Anesthesia Plan: General   Post-op Pain Management:    Induction: Intravenous  Airway Management Planned: Oral ETT  Additional Equipment: None  Intra-op Plan:   Post-operative Plan: Extubation in OR  Informed Consent: I have reviewed the patients History and Physical, chart, labs and discussed the procedure including the risks, benefits and alternatives for the proposed anesthesia with the patient or authorized representative who has indicated his/her understanding and acceptance.   Dental advisory given  Plan Discussed with: CRNA and Surgeon  Anesthesia Plan Comments:         Anesthesia Quick Evaluation

## 2016-08-22 NOTE — Progress Notes (Signed)
Patient back from PACU at 1415. BP 93/60. [place] PACU RN called Dr. Ermalene Postin and he stated to monitor patient's BP as she is able to drink and continue ivf. Patient eating crackers and drinking ginger ale. Up to restroom with RN and tolerated well. States no pain. She did have bowel prep yesterday and stated she is probably still dehydrated.  BP 86/66 at 1500 and 82/60 at 1530. (Same results in both arms).   Called Dr. Ermalene Postin with above BP. Order received for NS bolus which is currently running via iv pump. States she "feels fine" and is resting in bed and talking with family. Monitoring patient closely.   At 1605 BP is now 103/74. Informed patient will continue to monitor and when hour NS bolus is done if her vitals continue to be stable she can be discharged home with family at that point. All discharge instructions have been reviewed with patient and her family.

## 2016-08-22 NOTE — Anesthesia Procedure Notes (Signed)
Procedure Name: Intubation Date/Time: 08/22/2016 10:34 AM Performed by: Everlean Cherry A Pre-anesthesia Checklist: Patient identified, Emergency Drugs available, Suction available and Patient being monitored Patient Re-evaluated:Patient Re-evaluated prior to inductionOxygen Delivery Method: Circle system utilized Preoxygenation: Pre-oxygenation with 100% oxygen Intubation Type: IV induction Ventilation: Mask ventilation without difficulty Laryngoscope Size: Miller and 2 Grade View: Grade I Tube type: Oral Tube size: 7.0 mm Number of attempts: 1 Airway Equipment and Method: Stylet Placement Confirmation: ETT inserted through vocal cords under direct vision,  positive ETCO2 and breath sounds checked- equal and bilateral Secured at: 22 cm Tube secured with: Tape Dental Injury: Teeth and Oropharynx as per pre-operative assessment

## 2016-08-22 NOTE — H&P (Signed)
History of Present Illness Sabrina Ruff MD; [id number] 10:06 AM) The patient is a 70 year old female who presents with non-malignant abdominal pain. 70 year old female who presents to the office for evaluation of a recent history of frequent UTIs and passing fecal matter in her urine. She denies any passage of air in her urine. She complains of chronic left-sided abdominal pain that has been present for 3-4 years. Her last colonoscopy was in May 2017 by Dr. Hilarie Fredrickson. There were multiple diverticula but no signs of active diverticulitis. After her most recent episode she underwent a CT scan. This also showed no signs of diverticulitis or acute inflammation. She has a history of multiple abdominal surgeries including appendectomy and cholecystectomy as well as gastric bypass surgery performed in open fashion and revision gastric bypass surgery performed several years ago. CT scan was performed which is concerning for a colo-vaginal fistula. She presented to my office 2 weeks ago with complaints of tachycardia and worsening left lower quadrant pain. I placed her on Augmentin and had her follow-up with her cardiologist. She was found to be in atrial fibrillation and was placed in the hospital. She converted to normal sinus rhythm on IV diltiazem. She is now on Ellquis. She is feeling somewhat better. Her left lower quadrant pain has been rather minimal recently. She is having bowel movements without difficulty.   Problem List/Past Medical Sabrina Ruff, MD; [id number] 10:07 AM) Sabrina [name] FISTULA (N82.4)  Other Problems Sabrina Ruff, MD; [id number] 10:07 AM) Hypercholesterolemia Gastroesophageal Reflux Disease Kidney Stone Ulcerative Colitis Cholelithiasis Chest pain Congestive Heart Failure Diverticulosis Crohn's Disease FREQUENT UTI (N39.0) Arthritis Bladder Problems Back Pain  Past Surgical History Sabrina Ruff, MD; [id number] 10:07 AM) Hip Surgery  Left. Gastric Bypass Hysterectomy (not due to cancer) - Partial Oral Surgery Knee Surgery Right. Gallbladder Surgery - Open Appendectomy Tonsillectomy  Diagnostic Studies History Sabrina Ruff, MD; [id number] 10:07 AM) Mammogram within last year Colonoscopy never Pap Smear 1-5 years ago  Allergies Elbert Ewings, CMA; 07/01/2016 9:47 AM) Codeine Phosphate *ANALGESICS - OPIOID*  Medication History  ALPRAZolam (0.5MG  Tablet, Oral) Active. DULoxetine HCl (60MG  Capsule DR Part, Oral) Active. Furosemide (20MG  Tablet, Oral) Active. Gabapentin (100MG  Capsule, Oral) Active. Ketorolac Tromethamine (10MG  Tablet, Oral) Active. Nitrofurantoin Monohyd Macro (100MG  Capsule, Oral) Active. Simvastatin (20MG  Tablet, Oral) Active. Vitamin D (Ergocalciferol) (50000UNIT Capsule, Oral) Active. Medications Reconciled Lipitor (10MG  Tablet, Oral) Active. Eliquis (2.5MG  Tablet, Oral) Active.  (Held for 4 days) Cymbalta (20MG  Capsule DR Part, Oral) Active. DilTIAZem HCl (60MG  Tablet, Oral) Active.  Social History  Alcohol use Remotely quit alcohol use. Caffeine use Carbonated beverages, Coffee. Tobacco use Current every day smoker. Illicit drug use Remotely quit drug use.  Family History  Respiratory Condition Mother. Kidney Disease Mother. Colon Cancer Mother. Arthritis Mother. Depression Mother. Heart disease in female family member before age 43 Heart Disease Mother.  Pregnancy / Birth History  Maternal age 23-30 Irregular periods Age at menarche 17 years. Gravida 3 Age of menopause 51-55     Review of Systems  General Present- Chills, Fatigue, Night Sweats and Weight Gain. Not Present- Appetite Loss, Fever and Weight Loss. Skin Present- Dryness. Not Present- Change in Wart/Mole, Hives, Jaundice, New Lesions, Non-Healing Wounds, Rash and Ulcer. HEENT Present- Ringing in the Ears, Sore Throat and Wears glasses/contact lenses. Not  Present- Earache, Hearing Loss, Hoarseness, Nose Bleed, Oral Ulcers, Seasonal Allergies, Sinus Pain, Visual Disturbances and Yellow Eyes. Cardiovascular Present- Leg Cramps, Shortness of Breath and Swelling of Extremities. Not Present- Chest Pain, Difficulty Breathing  Lying Down, Palpitations and Rapid Heart Rate. Gastrointestinal Present- Abdominal Pain, Bloating, Bloody Stool, Change in Bowel Habits, Constipation, Difficulty Swallowing, Excessive gas and Indigestion. Not Present- Chronic diarrhea, Gets full quickly at meals, Hemorrhoids, Nausea, Rectal Pain and Vomiting. Female Genitourinary Present- Frequency, Nocturia, Pelvic Pain and Urgency. Not Present- Painful Urination. Musculoskeletal Present- Back Pain, Joint Pain, Joint Stiffness, Muscle Pain, Muscle Weakness and Swelling of Extremities. Neurological Present- Tremor and Weakness. Not Present- Decreased Memory, Fainting, Headaches, Numbness, Seizures, Tingling and Trouble walking. Psychiatric Present- Anxiety. Not Present- Bipolar, Change in Sleep Pattern, Depression, Fearful and Frequent crying. Endocrine Present- Hair Changes, Heat Intolerance and Hot flashes. Not Present- Cold Intolerance, Excessive Hunger and New Diabetes.  BP 113/80   Pulse 90   Temp 98.4 F (36.9 C) (Oral)   Resp 16   Ht 5\' 4"  (1.626 m)   Wt 87.5 kg (193 lb)   SpO2 97%   BMI 33.13 kg/m    Physical Exam   General Mental Status-Alert. General Appearance-Not in acute distress. Build & Nutrition-Well nourished. Posture-Normal posture. Gait-Normal.  Head and Neck Head-normocephalic, atraumatic with no lesions or palpable masses. Trachea-midline.  Chest and Lung Exam Chest and lung exam reveals -on auscultation, normal breath sounds, no adventitious sounds and normal vocal resonance.  Cardiovascular Cardiovascular examination reveals -normal heart sounds, regular rate and rhythm with no  murmurs.  Abdomen Inspection Inspection of the abdomen reveals - No Hernias. Palpation/Percussion Palpation and Percussion of the abdomen reveal - Soft, No Rigidity (guarding), No hepatosplenomegaly and No Palpable abdominal masses. Tenderness - Note: She has some mild pelvic tenderness, she also has some left costal tenderness.  Neurologic Neurologic evaluation reveals -alert and oriented x 3 with no impairment of recent or remote memory, normal attention span and ability to concentrate, normal sensation and normal coordination.  Musculoskeletal Normal Exam - Bilateral-Upper Extremity Strength Normal and Lower Extremity Strength Normal.    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 Given her history of multiple upper abdominal surgeries, I recommended a laparoscopic approach with possible open pelvic resection. We will touch base with her cardiologist to determine if any bridging is needed for her anticoagulation. The surgery and anatomy were described to the patient as well as the risks of surgery and the possible complications. These include: Bleeding, deep abdominal infections and possible wound complications such as hernia and infection, damage to adjacent structures, leak of surgical connections, which can lead to other surgeries and possibly an ostomy, possible need for other procedures, such as abscess drains in radiology, possible prolonged hospital stay, possible diarrhea from removal of part of the colon, possible constipation from narcotics, possible bowel, bladder or sexual dysfunction if having rectal surgery, prolonged fatigue/weakness or appetite loss, possible early recurrence of of disease, possible complications of their medical problems such as heart disease or arrhythmias or lung problems, death (less than 1%). I believe the patient understands and wishes to proceed with  the surgery.

## 2016-08-25 ENCOUNTER — Encounter (HOSPITAL_COMMUNITY): Payer: Self-pay | Admitting: General Surgery

## 2016-09-01 ENCOUNTER — Telehealth: Payer: Self-pay | Admitting: Cardiovascular Disease

## 2016-09-01 NOTE — Telephone Encounter (Signed)
New Message  Pt c/o BP issue: STAT if pt c/o blurred vision, one-sided weakness or slurred speech  1. What are your last 5 BP readings? 139/105-78, 135/106-46, 108/79-119, 165/127-74, 106/77-98, 135/86-95(Most Recent)  2. Are you having any other symptoms (ex. Dizziness, headache, blurred vision, passed out)? Headache today and dizziness yesterday. Tired and sweaty.  3. What is your BP issue? Pt voiced her BP was very high and doesn't feel good.  Please f/u with pt

## 2016-09-01 NOTE — Telephone Encounter (Signed)
Attempted to call phone numbers that I have for patient in chart.  One number appears to be connected to a fax machine and the other is not a working number.  The number listed on DPR is for [place]

## 2016-09-02 NOTE — Telephone Encounter (Signed)
Attempted to call new number provided x 2 and only received rapid beep (faster than a busy signal).

## 2016-09-02 NOTE — Telephone Encounter (Signed)
Patient is calling back with correct phone number:  [phone]

## 2016-09-03 NOTE — Telephone Encounter (Signed)
Unable to reach patient on this number

## 2016-09-04 NOTE — Telephone Encounter (Signed)
I am still unable to reach the patient at this number.  Closing encounter

## 2016-09-05 ENCOUNTER — Encounter: Payer: Self-pay | Admitting: Cardiovascular Disease

## 2016-09-12 ENCOUNTER — Other Ambulatory Visit: Payer: Self-pay | Admitting: Cardiology

## 2016-09-23 ENCOUNTER — Other Ambulatory Visit: Payer: Medicare HMO | Admitting: *Deleted

## 2016-09-23 DIAGNOSIS — E785 Hyperlipidemia, unspecified: Secondary | ICD-10-CM

## 2016-09-23 NOTE — Addendum Note (Signed)
Addended by: Eulis Foster on: 09/23/2016 09:15 AM   Modules accepted: Orders

## 2016-09-24 LAB — COMPREHENSIVE METABOLIC PANEL
A/G RATIO: 1.6 (ref 1.2–2.2)
ALK PHOS: 120 IU/L — AB (ref 39–117)
ALT: 12 IU/L (ref 0–32)
AST: 18 IU/L (ref 0–40)
Albumin: 3.7 g/dL (ref 3.6–4.8)
BUN/Creatinine Ratio: 21 (ref 12–28)
BUN: 16 mg/dL (ref 8–27)
Bilirubin Total: 0.6 mg/dL (ref 0.0–1.2)
CO2: 25 mmol/L (ref 18–29)
Calcium: 9 mg/dL (ref 8.7–10.3)
Chloride: 101 mmol/L (ref 96–106)
Creatinine, Ser: 0.78 mg/dL (ref 0.57–1.00)
GFR calc Af Amer: 90 mL/min/{1.73_m2} (ref >59)
GFR calc non Af Amer: 78 mL/min/{1.73_m2} (ref >59)
GLOBULIN, TOTAL: 2.3 g/dL (ref 1.5–4.5)
Glucose: 95 mg/dL (ref 65–99)
POTASSIUM: 3.8 mmol/L (ref 3.5–5.2)
SODIUM: 142 mmol/L (ref 134–144)
Total Protein: 6 g/dL (ref 6.0–8.5)

## 2016-09-24 LAB — LIPID PANEL
CHOLESTEROL TOTAL: 189 mg/dL (ref 100–199)
Chol/HDL Ratio: 3.2 ratio units (ref 0.0–4.4)
HDL: 59 mg/dL (ref >39)
LDL Calculated: 102 mg/dL — ABNORMAL HIGH (ref 0–99)
TRIGLYCERIDES: 140 mg/dL (ref 0–149)
VLDL Cholesterol Cal: 28 mg/dL (ref 5–40)

## 2016-09-25 ENCOUNTER — Telehealth: Payer: Self-pay | Admitting: Internal Medicine

## 2016-09-25 ENCOUNTER — Encounter: Payer: Self-pay | Admitting: Cardiovascular Disease

## 2016-09-25 ENCOUNTER — Ambulatory Visit (INDEPENDENT_AMBULATORY_CARE_PROVIDER_SITE_OTHER): Payer: Medicare HMO | Admitting: Cardiovascular Disease

## 2016-09-25 VITALS — BP 95/78 | HR 90 | Ht 64.0 in | Wt 184.4 lb

## 2016-09-25 DIAGNOSIS — I480 Paroxysmal atrial fibrillation: Secondary | ICD-10-CM | POA: Diagnosis not present

## 2016-09-25 DIAGNOSIS — I951 Orthostatic hypotension: Secondary | ICD-10-CM

## 2016-09-25 NOTE — Patient Instructions (Addendum)
Medication Instructions:  STOP Diltiazem (Cardizem) STOP Lasix (Furosemide)    Labwork: TODAY - CBC, TSH   Testing/Procedures: None Ordered   Follow-Up: Your physician wants you to follow-up in: 3 months with Dr. Acie Fredrickson.  You will receive a reminder letter in the mail two months in advance. If you don't receive a letter, please call our office to schedule the follow-up appointment.   If you need a refill on your cardiac medications before your next appointment, please call your pharmacy.   Thank you for choosing [facility]! Christen Bame, RN [phone]

## 2016-09-25 NOTE — Telephone Encounter (Signed)
Pt states she had surgical procedure early December with [facility]. Saw her cardiologist today and pt is still c/o abdominal pain. He suggested she contact GI or surgery. Pt states she saw [facility] last week and was told not to call them back that she was done with them. Pt requesting to be seen due to abdominal pain. Was found to have rectocele in hospital, not a colo-vaginal fistula. Pt scheduled to see Nicoletta Ba PA tomorrow at 9am. Pt aware of appt.

## 2016-09-25 NOTE — Progress Notes (Signed)
Cardiology Office Note   Date:  09/25/2016   ID:  Sabrina [name], Sabrina [name] 07/13/1946, MRN [id number]  PCP:  Evelina Dun, FNP  Cardiologist:   Mertie Moores, MD   Chief Complaint  Patient presents with  . Follow-up    hyperlipidemia      History of Present Illness: Sabrina [name] is a 71 y.o. female who presents for Preoperative evaluation.  This Sabrina [name] was seen in the office with Sabrina Maw, PA and was admitted to the hospital with rapid atrial fibrillation. She converted on IV dilt to sinus rhythm .  Has done well. Feeling better   Jan. 4, 2018:  Sabrina [name] is seen for follow-up of her paroxysmal atrial fibrillation.     She was found to have a rectocele.   Did not have any repair   Has very low BP recently.   Is having sweats  She is not eating well. Blood work from 2 days ago shows that her albumin is 1.6. Total protein is 6.0. A segment about profile is within normal limits.  Past Medical History:  Diagnosis Date  . Allergy   . Anemia   . Ankle fracture, right    past hx. -"no surgery"  . Anxiety   . Arthritis   . Cataract   . Chronic diastolic heart failure ([place])   . Collagen vascular disease ([place])   . Depression   . Dysrhythmia    Tachycardia, Atrial Fib episode noted x1- 9'17.Dr. Acie Fredrickson note [phone] Epic.  . Fibromyalgia   . Fistula   . GERD (gastroesophageal reflux disease)   . Hyperlipidemia   . Hypertension   . Neuromuscular disorder ([facility])    feet, legs, hands- tx. Gabapentin  . Neuropathy ([facility])    legs and feet  . Tubular adenoma of colon   . UTI (urinary tract infection)    frequent UTI- tx. recently.    Past Surgical History:  Procedure Laterality Date  . ABDOMINAL HYSTERECTOMY    . APPENDECTOMY    . broken hip     left; metal  . CHOLECYSTECTOMY  1978  . GASTRIC BYPASS  2002   [place]  . LAPAROSCOPY  08/22/2016   Procedure: LAPAROSCOPY DIAGNOSTIC;  Surgeon: [name], MD;  Location: [facility];  Service:  General;;  . SPLENECTOMY  2002  . TONSILLECTOMY  1963     Current Outpatient Prescriptions  Medication Sig Dispense Refill  . albuterol (PROVENTIL HFA;VENTOLIN HFA) 108 (90 BASE) MCG/ACT inhaler Inhale 2 puffs into the lungs every 4 (four) hours as needed for wheezing or shortness of breath. Reported on 11/22/2015    . albuterol (PROVENTIL) (2.5 MG/3ML) 0.083% nebulizer solution Take 3 mLs (2.5 mg total) by nebulization every 6 (six) hours as needed for wheezing or shortness of breath. 150 mL 1  . ALPRAZolam (XANAX) 0.5 MG tablet TAKE 1 TABLET BY MOUTH AT BEDTIME AS NEEDED FOR ANXIETY 45 tablet 1  . bismuth subsalicylate (PEPTO BISMOL) 262 MG/15ML suspension Take 30 mLs by mouth every 6 (six) hours as needed for indigestion.    Marland Kitchen diltiazem (CARDIZEM CD) 180 MG 24 hr capsule Take 1 capsule (180 mg total) by mouth daily. 30 capsule 6  . DULoxetine (CYMBALTA) 60 MG capsule TAKE 1 CAPSULE (60 MG TOTAL) BY MOUTH DAILY. 30 capsule 0  . ELIQUIS 5 MG TABS tablet TAKE 1 TABLET (5 MG TOTAL) BY MOUTH 2 (TWO) TIMES DAILY. 60 tablet 5  . ferrous sulfate 325 (65 FE) MG  tablet Take 325 mg by mouth 3 (three) times a week.    . fluticasone (FLONASE) 50 MCG/ACT nasal spray Place 2 sprays into both nostrils daily. 16 g 5  . furosemide (LASIX) 20 MG tablet TAKE 1 TABLET (20 MG TOTAL) BY MOUTH 2 (TWO) TIMES DAILY. (Patient taking differently: TAKE 1 TABLET (20 MG TOTAL) BY MOUTH 2 (TWO) TIMES DAILY. may take an additional 20 mgs as needed for swelling) 60 tablet 3  . gabapentin (NEURONTIN) 100 MG capsule Take 100 mg by mouth 2 (two) times daily.    . halobetasol (ULTRAVATE) 0.05 % cream APPLY TOPICALLY 2 (TWO) TIMES DAILY. 50 g 2  . omeprazole (PRILOSEC) 40 MG capsule Take 1 capsule (40 mg total) by mouth daily. Open capsule and sprinkle on pudding, apple sauce or something soft.  One every am on an empty stomach 20 -30 minutes prior to breakfast. 30 capsule 5  . saccharomyces boulardii (FLORASTOR) 250 MG capsule Take  1 capsule (250 mg total) by mouth 2 (two) times daily. 60 capsule 6  . traMADol (ULTRAM) 50 MG tablet Take 1-2 tablets (50-100 mg total) by mouth every 6 (six) hours as needed. 30 tablet 0  . Vitamin D, Ergocalciferol, (DRISDOL) 50000 units CAPS capsule TAKE 1 CAPSULE (50,000 UNITS TOTAL) BY MOUTH EVERY 7 (SEVEN) DAYS. (Patient taking differently: TAKE 1 CAPSULE (50,000 UNITS TOTAL) BY MOUTH EVERY 7 (SEVEN) DAYS. (Wednesday)) 12 capsule 0  . amoxicillin-clavulanate (AUGMENTIN) 875-125 MG tablet Take 1 tablet by mouth 2 (two) times daily.    Marland Kitchen atorvastatin (LIPITOR) 20 MG tablet Take 1 tablet (20 mg total) by mouth daily. 30 tablet 11  . metoprolol tartrate (LOPRESSOR) 25 MG tablet Take 1 tablet (25 mg total) by mouth 2 (two) times daily. 60 tablet 11   No current facility-administered medications for this visit.     Allergies:   Codeine    Social History:  The patient  reports that she quit smoking about 23 years ago. She has never used smokeless tobacco. She reports that she does not drink alcohol or use drugs.   Family History:  The patient's family history includes Alzheimer's disease in her mother; Arthritis in her father; Cancer in her mother; Colon cancer (age of onset: 36) in her paternal uncle.    ROS:  Please see the history of present illness.    Review of Systems: Constitutional:  denies fever, chills, diaphoresis, appetite change and fatigue.  HEENT: denies photophobia, eye pain, redness, hearing loss, ear pain, congestion, sore throat, rhinorrhea, sneezing, neck pain, neck stiffness and tinnitus.  Respiratory: denies SOB, DOE, cough, chest tightness, and wheezing.  Cardiovascular: denies chest pain, palpitations and leg swelling.  Gastrointestinal: denies nausea, vomiting, abdominal pain, diarrhea, constipation, blood in stool.  Genitourinary: denies dysuria, urgency, frequency, hematuria, flank pain and difficulty urinating.  Musculoskeletal: denies  myalgias, back pain,  joint swelling, arthralgias and gait problem.   Skin: denies pallor, rash and wound.  Neurological: denies dizziness, seizures, syncope, weakness, light-headedness, numbness and headaches.   Hematological: denies adenopathy, easy bruising, personal or family bleeding history.  Psychiatric/ Behavioral: denies suicidal ideation, mood changes, confusion, nervousness, sleep disturbance and agitation.       All other systems are reviewed and negative.    PHYSICAL EXAM: VS:  BP 95/78   Pulse 90   Ht 5\' 4"  (1.626 m)   Wt 184 lb 6.4 oz (83.6 kg)   SpO2 97%   BMI 31.65 kg/m  , BMI Body mass index is  31.65 kg/m. GEN: Well nourished, well developed, in no acute distress  HEENT: normal  Neck: no JVD, carotid bruits, or masses Cardiac: RRR; no murmurs, rubs, or gallops,no edema  Respiratory:  clear to auscultation bilaterally, normal work of breathing GI: soft, nontender, nondistended, + BS MS: no deformity or atrophy  Skin: warm and dry, no rash Neuro:  Strength and sensation are intact Psych: normal   EKG:  EKG is ordered today.  Atrial fib with V rate of 87.   No ST or T wave changes   Recent Labs: 06/12/2016: Magnesium 2.1; TSH 7.795 08/18/2016: Hemoglobin 14.9; Platelets 371 09/23/2016: ALT 12; BUN 16; Creatinine, Ser 0.78; Potassium 3.8; Sodium 142    Lipid Panel    Component Value Date/Time   CHOL 189 09/23/2016 0915   TRIG 140 09/23/2016 0915   HDL 59 09/23/2016 0915   CHOLHDL 3.2 09/23/2016 0915   LDLCALC 102 (H) 09/23/2016 0915      Wt Readings from Last 3 Encounters:  09/25/16 184 lb 6.4 oz (83.6 kg)  08/22/16 193 lb (87.5 kg)  08/18/16 193 lb 8 oz (87.8 kg)      Other studies Reviewed: Additional studies/ records that were reviewed today include: . Review of the above records demonstrates:    ASSESSMENT AND PLAN:  1.  Paroxysmal atrial fib She is back in atrial ablation today. She clearly has paroxysmal A. fib. At this point she appears to be stable.  Because her blood pressure is low we will be stopping the diltiazem. Her echocardiogram reveals normal left ventricle systolic function.  We will continue Eliquis.  Continue metoprolol for now.  2. Orthostatic hypotension: She has not been eating well since her surgery. She has been taking her diltiazem and Lasix as prescribed. We'll stop the Lasix and diltiazem for now. I've encouraged her to eat better including eating more protein trig more fluids. I've encouraged her to eat some chicken soup today.  She can also have a V8 juice.  3. Rectocele: She is still having abdominal pain. She continues to have problems with her stool. I've encouraged her to call her GI doctor or the surgeon for further recs.   She's been having sweats  Will check CBC and TSH today - her last TSH was slightly elevated.   Current medicines are reviewed at length with the patient today.  The patient does not have concerns regarding medicines.  Labs/ tests ordered today include:  No orders of the defined types were placed in this encounter.    Disposition:   FU with me in 3 months      Mertie Moores, MD  09/25/2016 11:33 AM    [address], [place], [place]  [address] Phone: [phone]; Fax: [phone]

## 2016-09-26 ENCOUNTER — Telehealth: Payer: Self-pay | Admitting: Cardiovascular Disease

## 2016-09-26 ENCOUNTER — Other Ambulatory Visit (INDEPENDENT_AMBULATORY_CARE_PROVIDER_SITE_OTHER): Payer: Medicare HMO

## 2016-09-26 ENCOUNTER — Ambulatory Visit (INDEPENDENT_AMBULATORY_CARE_PROVIDER_SITE_OTHER): Payer: Medicare HMO | Admitting: Physician Assistant

## 2016-09-26 ENCOUNTER — Encounter: Payer: Self-pay | Admitting: Physician Assistant

## 2016-09-26 VITALS — BP 86/66 | HR 72 | Ht 64.5 in | Wt 183.1 lb

## 2016-09-26 DIAGNOSIS — R630 Anorexia: Secondary | ICD-10-CM | POA: Diagnosis not present

## 2016-09-26 DIAGNOSIS — R6883 Chills (without fever): Secondary | ICD-10-CM

## 2016-09-26 DIAGNOSIS — R1032 Left lower quadrant pain: Secondary | ICD-10-CM

## 2016-09-26 DIAGNOSIS — R109 Unspecified abdominal pain: Secondary | ICD-10-CM | POA: Diagnosis not present

## 2016-09-26 LAB — CBC WITH DIFFERENTIAL/PLATELET
Basophils Absolute: 0.1 10*3/uL (ref 0.0–0.2)
Basos: 1 %
EOS (ABSOLUTE): 0.3 10*3/uL (ref 0.0–0.4)
Eos: 3 %
HEMOGLOBIN: 15 g/dL (ref 11.1–15.9)
Hematocrit: 44.9 % (ref 34.0–46.6)
Immature Grans (Abs): 0 10*3/uL (ref 0.0–0.1)
Immature Granulocytes: 0 %
LYMPHS ABS: 2.9 10*3/uL (ref 0.7–3.1)
Lymphs: 29 %
MCH: 30.7 pg (ref 26.6–33.0)
MCHC: 33.4 g/dL (ref 31.5–35.7)
MCV: 92 fL (ref 79–97)
MONOS ABS: 0.6 10*3/uL (ref 0.1–0.9)
Monocytes: 6 %
NEUTROS ABS: 6.2 10*3/uL (ref 1.4–7.0)
Neutrophils: 61 %
PLATELETS: 372 10*3/uL (ref 150–379)
RBC: 4.88 x10E6/uL (ref 3.77–5.28)
RDW: 13.8 % (ref 12.3–15.4)
WBC: 10 10*3/uL (ref 3.4–10.8)

## 2016-09-26 LAB — URINALYSIS, ROUTINE W REFLEX MICROSCOPIC
BILIRUBIN URINE: NEGATIVE
HGB URINE DIPSTICK: NEGATIVE
KETONES UR: NEGATIVE
NITRITE: NEGATIVE
Specific Gravity, Urine: 1.015 (ref 1.000–1.030)
Total Protein, Urine: NEGATIVE
Urine Glucose: NEGATIVE
Urobilinogen, UA: 0.2 (ref 0.0–1.0)
pH: 6 (ref 5.0–8.0)

## 2016-09-26 LAB — TSH: TSH: 2.59 u[IU]/mL (ref 0.450–4.500)

## 2016-09-26 LAB — SEDIMENTATION RATE: Sed Rate: 10 mm/hr (ref 0–30)

## 2016-09-26 LAB — C-REACTIVE PROTEIN: CRP: 0.2 mg/dL — ABNORMAL LOW (ref 0.5–20.0)

## 2016-09-26 MED ORDER — AMOXICILLIN-POT CLAVULANATE 875-125 MG PO TABS: 1.0000 | ORAL_TABLET | Freq: Two times a day (BID) | ORAL | 0 refills | Status: DC

## 2016-09-26 NOTE — Telephone Encounter (Signed)
New Message  Pt voiced she is calling to get lab results and would like for nurse to return her call.  Pt voiced to call back after 4 with the results if you could.  Please f/u with pt

## 2016-09-26 NOTE — Patient Instructions (Addendum)
Please go to the basement level to have your labs drawn and urine test.  We sent a prescription for Augmentin 875/125 mg to [place], [place].   You have been scheduled for a CT scan of the abdomen and pelvis at [facility] ([address]---this is in the same building as Press [facility]).   You are scheduled on Monday 09-29-2016 at 2:00 PM. You should arrive at 1:45 PM to your appointment time for registration. Please follow the written instructions below on the day of your exam:  WARNING: IF YOU ARE ALLERGIC TO IODINE/X-RAY DYE, PLEASE NOTIFY RADIOLOGY IMMEDIATELY AT [phone]! YOU WILL BE GIVEN A 13 HOUR PREMEDICATION PREP.  1) Do not eat  anything after 10:00 am (4 hours prior to your test) 2) You have been given 2 bottles of oral contrast to drink. The solution may taste  better if refrigerated, but do NOT add ice or any other liquid to this solution. Shake well before drinking.    Drink 1 bottle of contrast @ 12:00 noon (2 hours prior to your exam)  Drink 1 bottle of contrast @ 1:00 PM (1 hour prior to your exam)  You may take any medications as prescribed with a small amount of water except for the following: Metformin, Glucophage, Glucovance, Avandamet, Riomet, Fortamet, Actoplus Met, Janumet, Glumetza or Metaglip. The above medications must be held the day of the exam AND 48 hours after the exam.  The purpose of you drinking the oral contrast is to aid in the visualization of your intestinal tract. The contrast solution may cause some diarrhea. Before your exam is started, you will be given a small amount of fluid to drink. Depending on your individual set of symptoms, you may also receive an intravenous injection of x-ray contrast/dye. Plan on being at [facility] for 30 minutes or long, depending on the type of exam you are having performed.  If you have any questions regarding your exam or if you need to reschedule, you may call the CT department at  [phone] between the hours of 8:00 am and 5:00 pm, Monday-Friday.  ________________________________________________________________________

## 2016-09-26 NOTE — Progress Notes (Addendum)
Subjective:    Patient ID: Sabrina [name], female    DOB: Feb 11, 1946, 71 y.o.   MRN: [id number]  HPI Sabrina [name]  is a pleasant 71 year old white female known to Dr. Hilarie Fredrickson. She has history of hypertension, congestive heart failure, atrial fibrillation for which she is on eliquis. She also has diffuse diverticulosis. She has been having problems with lower abdominal pain. She had undergone CT of the pelvis, in September 2017 which showed numerous diverticuli involving the descending and sigmoid colon there was no significant wall thickening or pericolonic edema to suggest acute inflammation, no dilated distal small bowel. There was a small amount of gas present within the vaginal vault and on one image a thin [place] of slight increased density suggesting contrast within the vagina oozing possibility of a rectovaginal fistula. She was also noted to have moderate to marked disc space narrowing at  L4-L5 and L5-S1 Patient was referred to surgery and was seen by Dr. Marcello Moores for possible rectovaginal fistula. She ultimately underwent a diagnostic lap and rectal under anesthesia on 08/22/2016. There was no evidence of rectovaginal fistula, she was noted to have a rectocele. Patient comes in today stating that she continues to have the same left lower abdominal pain which is been fairly constant over the past few months. She says her stools have been formed and harder to pass. She is having frequent pasty-type stools but no diarrhea. Over the past couple of weeks she's been having intermittent chills but no documented fever no diaphoresis. She says her appetite is for poor and she generally just does not feel well. Upper weight is down 14 pounds over the past few months. Patient had been diagnosed with a resistant Escherichia coli UTI towards the end of October 2017 which was treated with Augmentin. Last colonoscopy May 2017 with a 5 mm polyp removed and multiple large and small mouth diverticuli throughout the  colon.  Review of Systems Pertinent positive and negative review of systems were noted in the above HPI section.  All other review of systems was otherwise negative.  Outpatient Encounter Prescriptions as of 09/26/2016  Medication Sig  . albuterol (PROVENTIL HFA;VENTOLIN HFA) 108 (90 BASE) MCG/ACT inhaler Inhale 2 puffs into the lungs every 4 (four) hours as needed for wheezing or shortness of breath. Reported on 11/22/2015  . albuterol (PROVENTIL) (2.5 MG/3ML) 0.083% nebulizer solution Take 3 mLs (2.5 mg total) by nebulization every 6 (six) hours as needed for wheezing or shortness of breath.  Marland Kitchen atorvastatin (LIPITOR) 20 MG tablet Take 1 tablet (20 mg total) by mouth daily.  Marland Kitchen bismuth subsalicylate (PEPTO BISMOL) 262 MG/15ML suspension Take 30 mLs by mouth every 6 (six) hours as needed for indigestion.  . DULoxetine (CYMBALTA) 60 MG capsule TAKE 1 CAPSULE (60 MG TOTAL) BY MOUTH DAILY.  Marland Kitchen ELIQUIS 5 MG TABS tablet TAKE 1 TABLET (5 MG TOTAL) BY MOUTH 2 (TWO) TIMES DAILY.  . ferrous sulfate 325 (65 FE) MG tablet Take 325 mg by mouth 3 (three) times a week.  . fluticasone (FLONASE) 50 MCG/ACT nasal spray Place 2 sprays into both nostrils daily.  Marland Kitchen gabapentin (NEURONTIN) 100 MG capsule Take 100 mg by mouth 2 (two) times daily.  . halobetasol (ULTRAVATE) 0.05 % cream APPLY TOPICALLY 2 (TWO) TIMES DAILY.  . metoprolol tartrate (LOPRESSOR) 25 MG tablet Take 1 tablet (25 mg total) by mouth 2 (two) times daily.  Marland Kitchen omeprazole (PRILOSEC) 40 MG capsule Take 1 capsule (40 mg total) by mouth daily. Open capsule and  sprinkle on pudding, apple sauce or something soft.  One every am on an empty stomach 20 -30 minutes prior to breakfast.  . saccharomyces boulardii (FLORASTOR) 250 MG capsule Take 1 capsule (250 mg total) by mouth 2 (two) times daily.  . Vitamin D, Ergocalciferol, (DRISDOL) 50000 units CAPS capsule TAKE 1 CAPSULE (50,000 UNITS TOTAL) BY MOUTH EVERY 7 (SEVEN) DAYS. (Patient taking differently: TAKE 1  CAPSULE (50,000 UNITS TOTAL) BY MOUTH EVERY 7 (SEVEN) DAYS. (Wednesday))  . amoxicillin-clavulanate (AUGMENTIN) 875-125 MG tablet Take 1 tablet by mouth 2 (two) times daily.  . [DISCONTINUED] ALPRAZolam (XANAX) 0.5 MG tablet TAKE 1 TABLET BY MOUTH AT BEDTIME AS NEEDED FOR ANXIETY  . [DISCONTINUED] amoxicillin-clavulanate (AUGMENTIN) 875-125 MG tablet Take 1 tablet by mouth 2 (two) times daily.  . [DISCONTINUED] traMADol (ULTRAM) 50 MG tablet Take 1-2 tablets (50-100 mg total) by mouth every 6 (six) hours as needed.   No facility-administered encounter medications on file as of 09/26/2016.    Allergies  Allergen Reactions  . Codeine Nausea And Vomiting    Stomach pain   Patient Active Problem List   Diagnosis Date Noted  . Hyperlipidemia 06/23/2016  . Colovesical fistula 06/12/2016  . Atrial fibrillation with RVR ([place]) 06/12/2016  . UTI (lower urinary tract infection) 06/12/2016  . GERD (gastroesophageal reflux disease) 06/12/2016  . Fibromyalgia 06/12/2016  . Peripheral neuropathy ([place]) 06/12/2016  . Atrial fibrillation ([place]) 06/12/2016  . S/P gastric bypass 01/22/2016  . S/P splenectomy 01/22/2016  . S/P cholecystectomy 01/22/2016  . Hx of small bowel obstruction 01/22/2016  . Asthma 10/19/2015  . Generalized anxiety disorder 10/19/2015  . Depression 10/19/2015  . Insomnia 10/19/2015  . Iron deficiency anemia 10/19/2015  . GAD (generalized anxiety disorder) 02/01/2015  . Vitamin D deficiency 11/29/2014  . Eczema 11/27/2014  . Benign essential HTN 09/25/2014  . Morbid obesity ([place]) 09/25/2014  . Chronic diastolic heart failure ([place]) 09/25/2014  . Chest tightness 03/27/2014  . SOB (shortness of breath) 03/27/2014  . Tick bites 02/28/2013  . Allergic rhinitis 02/28/2013   Social History   Social History  . Marital status: Divorced    Spouse name: N/A  . Number of children: N/A  . Years of education: N/A   Occupational History  . Not on file.   Social History Main  Topics  . Smoking status: Former Smoker    Quit date: 02/26/1993  . Smokeless tobacco: Never Used  . Alcohol use No  . Drug use: No  . Sexual activity: No   Other Topics Concern  . Not on file   Social History Narrative  . No narrative on file    Sabrina [name] family history includes Alzheimer's disease in her mother; Arthritis in her father; Cancer in her mother; Colon cancer (age of onset: 65) in her paternal uncle.      Objective:    Vitals:   09/26/16 0854  BP: (!) 86/66  Pulse: 72    Physical Exam  well-developed older white female in no acute distress, pleasant blood pressure 86/66 pulse 72, height 5 foot 4 weight 183 BMI of 30.9. HEENT; nontraumatic,normo cephalic EOMI PERRLA sclera anicteric, Cardiovascular; irregular rate and rhythm with S1-S2 no murmur or gallop, Pulmonary; clear bilaterally, Abdomen ;soft, bowel sounds are present she has tenderness bilaterally in the lower quadrants left greater than right some left mid quadrant tenderness there is no guarding or rebound no palpable mass or hepatosplenomegaly bowel sounds are present, Rectal ;exam not done, Extremities ;no clubbing cyanosis or  edema skin warm and dry, Neuropsych; mood and affect appropriate       Assessment & Plan:   #20 71 year old white female who has had several months of persistent left lower quadrant pain. CT done in September 2017 showed no evidence of diverticulitis though she has known diffuse diverticulosis. She has now been experiencing intermittent chills, poor appetite and some change in bowel habits. I wonder if she has some subacute diverticulitis. #2 History of resistant Escherichia coli UTI September 2017, rule out recurrence #3 hypertension #4 congestive heart failure #5 A. fib on eliquis #6 rectocele  Plan; Will check UA and urine culture Repeat CT of the abdomen and pelvis with contrast Start Augmentin 875 mg by mouth twice a day 14 days Further plans and follow up pending  results of CT.   Dorman Calderwood S Cashius Grandstaff PA-C 09/26/2016   Cc: Sharion Balloon, FNP   Addendum: Reviewed and agree with initial management. Jerene Bears, MD

## 2016-09-26 NOTE — Telephone Encounter (Signed)
Reviewed lab results with patient who verbalized understanding. She thanked me for the call.

## 2016-09-27 LAB — URINE CULTURE

## 2016-09-29 ENCOUNTER — Ambulatory Visit (INDEPENDENT_AMBULATORY_CARE_PROVIDER_SITE_OTHER)
Admission: RE | Admit: 2016-09-29 | Discharge: 2016-09-29 | Disposition: A | Discharge status: Home / Self Care | Payer: Medicare HMO | Source: Ambulatory Visit | Attending: Physician Assistant | Admitting: Physician Assistant

## 2016-09-29 DIAGNOSIS — R630 Anorexia: Secondary | ICD-10-CM

## 2016-09-29 DIAGNOSIS — R1032 Left lower quadrant pain: Secondary | ICD-10-CM | POA: Diagnosis not present

## 2016-09-29 DIAGNOSIS — R6883 Chills (without fever): Secondary | ICD-10-CM | POA: Diagnosis not present

## 2016-09-29 DIAGNOSIS — R109 Unspecified abdominal pain: Secondary | ICD-10-CM | POA: Diagnosis not present

## 2016-09-29 DIAGNOSIS — K5732 Diverticulitis of large intestine without perforation or abscess without bleeding: Secondary | ICD-10-CM | POA: Diagnosis not present

## 2016-09-29 MED ORDER — IOPAMIDOL (ISOVUE-300) INJECTION 61%
100.0000 mL | Freq: Once | INTRAVENOUS | Status: AC | PRN
  Administered 2016-09-29: 100 mL via INTRAVENOUS

## 2016-11-18 DIAGNOSIS — R159 Full incontinence of feces: Secondary | ICD-10-CM | POA: Diagnosis not present

## 2016-11-18 DIAGNOSIS — R150 Incomplete defecation: Secondary | ICD-10-CM | POA: Diagnosis not present

## 2017-01-18 DIAGNOSIS — M797 Fibromyalgia: Secondary | ICD-10-CM | POA: Diagnosis not present

## 2017-01-18 DIAGNOSIS — S8012XA Contusion of left lower leg, initial encounter: Secondary | ICD-10-CM | POA: Diagnosis not present

## 2017-01-18 DIAGNOSIS — S80812A Abrasion, left lower leg, initial encounter: Secondary | ICD-10-CM | POA: Diagnosis not present

## 2017-01-18 DIAGNOSIS — M79605 Pain in left leg: Secondary | ICD-10-CM | POA: Diagnosis not present

## 2017-02-05 ENCOUNTER — Encounter: Payer: Self-pay | Admitting: Family

## 2017-02-05 ENCOUNTER — Ambulatory Visit (INDEPENDENT_AMBULATORY_CARE_PROVIDER_SITE_OTHER): Payer: Medicare HMO | Admitting: Family

## 2017-02-05 VITALS — BP 113/78 | HR 67 | Temp 97.5°F | Ht 64.5 in | Wt 188.0 lb

## 2017-02-05 DIAGNOSIS — F411 Generalized anxiety disorder: Secondary | ICD-10-CM

## 2017-02-05 DIAGNOSIS — S8012XA Contusion of left lower leg, initial encounter: Secondary | ICD-10-CM

## 2017-02-05 DIAGNOSIS — Z09 Encounter for follow-up examination after completed treatment for conditions other than malignant neoplasm: Secondary | ICD-10-CM | POA: Diagnosis not present

## 2017-02-05 MED ORDER — DULOXETINE HCL 60 MG PO CPEP: 60.0000 mg | ORAL_CAPSULE | Freq: Every day | ORAL | 0 refills | Status: DC

## 2017-02-05 MED ORDER — DULOXETINE HCL 60 MG PO CPEP: 60.0000 mg | ORAL_CAPSULE | Freq: Every day | ORAL | 1 refills | Status: DC
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
## 2017-02-05 NOTE — Addendum Note (Signed)
Addended by: Evelina Dun A on: 02/05/2017 12:21 PM   Modules accepted: Orders, Level of Service

## 2017-02-05 NOTE — Patient Instructions (Signed)
Contusion A contusion is a deep bruise. Contusions are the result of a blunt injury to tissues and muscle fibers under the skin. The injury causes bleeding under the skin. The skin overlying the contusion may turn blue, purple, or yellow. Minor injuries will give you a painless contusion, but more severe contusions may stay painful and swollen for a few weeks. What are the causes? This condition is usually caused by a blow, trauma, or direct force to an area of the body. What are the signs or symptoms? Symptoms of this condition include:  Swelling of the injured area.  Pain and tenderness in the injured area.  Discoloration. The area may have redness and then turn blue, purple, or yellow. How is this diagnosed? This condition is diagnosed based on a physical exam and medical history. An X-ray, CT scan, or MRI may be needed to determine if there are any associated injuries, such as broken bones (fractures). How is this treated? Specific treatment for this condition depends on what area of the body was injured. In general, the best treatment for a contusion is resting, icing, applying pressure to (compression), and elevating the injured area. This is often called the RICE strategy. Over-the-counter anti-inflammatory medicines may also be recommended for pain control. Follow these instructions at home:  Rest the injured area.  If directed, apply ice to the injured area:  Put ice in a plastic bag.  Place a towel between your skin and the bag.  Leave the ice on for 20 minutes, 2-3 times per day.  If directed, apply light compression to the injured area using an elastic bandage. Make sure the bandage is not wrapped too tightly. Remove and reapply the bandage as directed by your health care provider.  If possible, raise (elevate) the injured area above the level of your heart while you are sitting or lying down.  Take over-the-counter and prescription medicines only as told by your health  care provider. Contact a health care provider if:  Your symptoms do not improve after several days of treatment.  Your symptoms get worse.  You have difficulty moving the injured area. Get help right away if:  You have severe pain.  You have numbness in a hand or foot.  Your hand or foot turns pale or cold. This information is not intended to replace advice given to you by your health care provider. Make sure you discuss any questions you have with your health care provider. Document Released: 06/18/2005 Document Revised: 01/17/2016 Document Reviewed: 01/24/2015 Elsevier Interactive Patient Education  2017 Elsevier Inc.  

## 2017-02-14 ENCOUNTER — Other Ambulatory Visit: Payer: Self-pay | Admitting: Cardiology

## 2017-02-17 ENCOUNTER — Other Ambulatory Visit: Payer: Self-pay | Admitting: Internal Medicine

## 2017-02-17 NOTE — Addendum Note (Signed)
Addended by: Derl Barrow on: 02/17/2017 09:20 AM   Modules accepted: Orders

## 2017-02-23 ENCOUNTER — Ambulatory Visit (INDEPENDENT_AMBULATORY_CARE_PROVIDER_SITE_OTHER): Payer: Medicare HMO | Admitting: Family

## 2017-02-23 ENCOUNTER — Encounter: Payer: Self-pay | Admitting: Family

## 2017-02-23 VITALS — BP 116/74 | HR 94 | Temp 97.4°F | Ht 64.5 in | Wt 188.6 lb

## 2017-02-23 DIAGNOSIS — M79605 Pain in left leg: Secondary | ICD-10-CM

## 2017-02-23 DIAGNOSIS — R609 Edema, unspecified: Secondary | ICD-10-CM | POA: Diagnosis not present

## 2017-02-23 DIAGNOSIS — S8012XS Contusion of left lower leg, sequela: Secondary | ICD-10-CM | POA: Diagnosis not present

## 2017-02-23 NOTE — Patient Instructions (Signed)
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
## 2017-03-19 ENCOUNTER — Ambulatory Visit: Payer: Medicare HMO | Admitting: Family

## 2017-03-28 ENCOUNTER — Other Ambulatory Visit: Payer: Self-pay | Admitting: Family

## 2017-03-28 DIAGNOSIS — F411 Generalized anxiety disorder: Secondary | ICD-10-CM

## 2017-04-07 DIAGNOSIS — H5200 Hypermetropia, unspecified eye: Secondary | ICD-10-CM | POA: Diagnosis not present

## 2017-04-07 DIAGNOSIS — Z0100 Encounter for examination of eyes and vision without abnormal findings: Secondary | ICD-10-CM | POA: Diagnosis not present

## 2017-05-19 ENCOUNTER — Ambulatory Visit (INDEPENDENT_AMBULATORY_CARE_PROVIDER_SITE_OTHER): Payer: Medicare HMO | Admitting: Nurse Practitioner

## 2017-05-19 ENCOUNTER — Encounter: Payer: Self-pay | Admitting: Nurse Practitioner

## 2017-05-19 VITALS — BP 129/86 | HR 56 | Temp 97.1°F | Ht 64.5 in | Wt 184.8 lb

## 2017-05-19 DIAGNOSIS — M26609 Unspecified temporomandibular joint disorder, unspecified side: Secondary | ICD-10-CM | POA: Diagnosis not present

## 2017-05-19 DIAGNOSIS — H9201 Otalgia, right ear: Secondary | ICD-10-CM | POA: Diagnosis not present

## 2017-05-19 MED ORDER — GABAPENTIN 100 MG PO CAPS: 100.0000 mg | ORAL_CAPSULE | Freq: Two times a day (BID) | ORAL | 3 refills | Status: DC

## 2017-05-19 MED ORDER — CYCLOBENZAPRINE HCL 5 MG PO TABS: 5.0000 mg | ORAL_TABLET | Freq: Three times a day (TID) | ORAL | 1 refills | Status: DC | PRN

## 2017-05-19 MED ORDER — METHOCARBAMOL 500 MG PO TABS: 500.0000 mg | ORAL_TABLET | Freq: Every day | ORAL | 3 refills | Status: DC

## 2017-05-19 NOTE — Addendum Note (Signed)
Addended by: Chevis Pretty on: 05/19/2017 04:05 PM   Modules accepted: Orders

## 2017-05-19 NOTE — Patient Instructions (Signed)
Jaw Range of Motion Exercises Jaw range of motion exercises are exercises that help your jaw to move better. These exercises can help to prevent:  Difficulty opening your mouth.  Pain in your jaw while it is both open and closed.  What should I be careful of when doing jaw exercises? Make sure that you only do jaw exercises as directed by your health care provider. You should only move your jaw as far as it can go in each direction, if told to do so by your health care provider. Do not move your jaw into positions that cause you any pain. What exercises should I do?  Stick your jaw forward. Hold this position for 1-2 seconds. Allow your jaw to return to its normal position and rest it there for 1-2 seconds. Do this exercise 8 times.  Stand or sit in front of a mirror. Place your tongue on the roof of your mouth, just behind your top teeth. Slowly open and close your jaw, keeping your tongue on the roof of your mouth. While you open and close your mouth, try to keep your jaw from moving toward one side or the other. Repeat this 8 times.  Move your jaw right. Hold this position for 1-2 seconds. Allow your jaw to return to its normal position, and rest it there for 1-2 seconds. Do this exercise 8 times.  Move your jaw left. Hold this position for 1-2 seconds. Allow your jaw to return to its normal position, and rest it there for 1-2 seconds. Do this exercise 8 times.  Open your mouth as far as it is can comfortably go. Hold this position for 1-2 seconds. Then close your mouth and rest for 1-2 seconds. Do this exercise 8 times.  Move your jaw in a circular motion, starting toward the right (clockwise). Repeat this 8 times.  Move your jaw in a circular motion, starting toward the left (counterclockwise). Repeat this 8 times. Apply moist heat packs or ice packs to your jaw before or after performing your exercises as directed by your health care provider. What else can I do? Avoid the following,  if they cause jaw pain or they increase your jaw pain:  Chewing gum.  Clenching your jaw or teeth or keeping tension in your jaw muscles.  Leaning on your jaw, such as resting your jaw in your hand while leaning on a desk.  This information is not intended to replace advice given to you by your health care provider. Make sure you discuss any questions you have with your health care provider. Document Released: 08/21/2008 Document Revised: 02/14/2016 Document Reviewed: 08/09/2014 Elsevier Interactive Patient Education  2018 Elsevier Inc.  

## 2017-05-19 NOTE — Progress Notes (Signed)
   Subjective:    Patient ID: Sabrina [name], female    DOB: Nov 12, 1945, 71 y.o.   MRN: [id number]  HPI Patient comes into the office with complaints of ear pain and a knot behind her right ear that started three weeks ago after taking her grandson to the water park.  Patient denies drainage.  She has not attempted any treatment. Patient has a history of TMJ and use to take muscle relaxor and neurotin for- but she ran out of meds about 1 month ago   Review of Systems  Constitutional: Negative for fever.  HENT: Positive for ear pain (x3 weeks), rhinorrhea, sinus pain and sinus pressure. Negative for ear discharge and hearing loss.   Respiratory: Negative for cough and shortness of breath.   Musculoskeletal: Negative for neck pain and neck stiffness.  All other systems reviewed and are negative.      Objective:   Physical Exam  Constitutional: She is oriented to person, place, and time. She appears well-developed and well-nourished. No distress.  HENT:  Head: Normocephalic.  Right Ear: External ear normal.  Left Ear: External ear normal.  Mouth/Throat: Oropharynx is clear and moist.  Eyes: Pupils are equal, round, and reactive to light.  Neck: Normal range of motion. Neck supple.  Cardiovascular: Normal rate and normal heart sounds.   Pulmonary/Chest: Effort normal and breath sounds normal. No respiratory distress. She has no wheezes.  Lymphadenopathy:    She has no cervical adenopathy.  Neurological: She is alert and oriented to person, place, and time.  Psychiatric: She has a normal mood and affect. Her behavior is normal.   BP 129/86   Pulse (!) 56   Temp (!) 97.1 F (36.2 C) (Oral)   Ht 5' 4.5" (1.638 m)   Wt 184 lb 12.8 oz (83.8 kg)   BMI 31.23 kg/m      Assessment & Plan:  1. Right ear pain Probable referred pain from TMJ  2. TMJ (temporomandibular joint disorder) Moist heat to right side of face Avoid foods that require a lot of chewing RTO prn -  methocarbamol (ROBAXIN) 500 MG tablet; Take 1 tablet (500 mg total) by mouth at bedtime.  Dispense: 30 tablet; Refill: 3 - gabapentin (NEURONTIN) 100 MG capsule; Take 1 capsule (100 mg total) by mouth 2 (two) times daily.  Dispense: 60 capsule; Refill: [place], FNP

## 2017-07-14 ENCOUNTER — Encounter: Payer: Self-pay | Admitting: Family

## 2017-07-14 ENCOUNTER — Ambulatory Visit (INDEPENDENT_AMBULATORY_CARE_PROVIDER_SITE_OTHER): Payer: Medicare HMO | Admitting: Family

## 2017-07-14 VITALS — BP 135/86 | HR 131 | Temp 96.8°F | Ht 64.5 in | Wt 183.8 lb

## 2017-07-14 DIAGNOSIS — Z23 Encounter for immunization: Secondary | ICD-10-CM | POA: Diagnosis not present

## 2017-07-14 DIAGNOSIS — D509 Iron deficiency anemia, unspecified: Secondary | ICD-10-CM | POA: Diagnosis not present

## 2017-07-14 DIAGNOSIS — F411 Generalized anxiety disorder: Secondary | ICD-10-CM | POA: Diagnosis not present

## 2017-07-14 DIAGNOSIS — Z1211 Encounter for screening for malignant neoplasm of colon: Secondary | ICD-10-CM

## 2017-07-14 DIAGNOSIS — F331 Major depressive disorder, recurrent, moderate: Secondary | ICD-10-CM | POA: Diagnosis not present

## 2017-07-14 DIAGNOSIS — J4520 Mild intermittent asthma, uncomplicated: Secondary | ICD-10-CM

## 2017-07-14 DIAGNOSIS — L309 Dermatitis, unspecified: Secondary | ICD-10-CM | POA: Diagnosis not present

## 2017-07-14 DIAGNOSIS — I5032 Chronic diastolic (congestive) heart failure: Secondary | ICD-10-CM

## 2017-07-14 DIAGNOSIS — G4700 Insomnia, unspecified: Secondary | ICD-10-CM

## 2017-07-14 DIAGNOSIS — I10 Essential (primary) hypertension: Secondary | ICD-10-CM

## 2017-07-14 DIAGNOSIS — E782 Mixed hyperlipidemia: Secondary | ICD-10-CM

## 2017-07-14 DIAGNOSIS — E559 Vitamin D deficiency, unspecified: Secondary | ICD-10-CM

## 2017-07-14 DIAGNOSIS — G6289 Other specified polyneuropathies: Secondary | ICD-10-CM | POA: Diagnosis not present

## 2017-07-14 DIAGNOSIS — K219 Gastro-esophageal reflux disease without esophagitis: Secondary | ICD-10-CM | POA: Diagnosis not present

## 2017-07-14 DIAGNOSIS — I4891 Unspecified atrial fibrillation: Secondary | ICD-10-CM

## 2017-07-14 NOTE — Patient Instructions (Signed)
Atrial Fibrillation Atrial fibrillation is a type of irregular or rapid heartbeat (arrhythmia). In atrial fibrillation, the heart quivers continuously in a chaotic pattern. This occurs when parts of the heart receive disorganized signals that make the heart unable to pump blood normally. This can increase the risk for stroke, heart failure, and other heart-related conditions. There are different types of atrial fibrillation, including:  Paroxysmal atrial fibrillation. This type starts suddenly, and it usually stops on its own shortly after it starts.  Persistent atrial fibrillation. This type often lasts longer than a week. It may stop on its own or with treatment.  Long-lasting persistent atrial fibrillation. This type lasts longer than 12 months.  Permanent atrial fibrillation. This type does not go away.  Talk with your health care provider to learn about the type of atrial fibrillation that you have. What are the causes? This condition is caused by some heart-related conditions or procedures, including:  A heart attack.  Coronary artery disease.  Heart failure.  Heart valve conditions.  High blood pressure.  Inflammation of the sac that surrounds the heart (pericarditis).  Heart surgery.  Certain heart rhythm disorders, such as Wolf-Parkinson-White syndrome.  Other causes include:  Pneumonia.  Obstructive sleep apnea.  Blockage of an artery in the lungs (pulmonary embolism, or PE).  Lung cancer.  Chronic lung disease.  Thyroid problems, especially if the thyroid is overactive (hyperthyroidism).  Caffeine.  Excessive alcohol use or illegal drug use.  Use of some medicines, including certain decongestants and diet pills.  Sometimes, the cause cannot be found. What increases the risk? This condition is more likely to develop in:  People who are older in age.  People who smoke.  People who have diabetes mellitus.  People who are overweight  (obese).  Athletes who exercise vigorously.  What are the signs or symptoms? Symptoms of this condition include:  A feeling that your heart is beating rapidly or irregularly.  A feeling of discomfort or pain in your chest.  Shortness of breath.  Sudden light-headedness or weakness.  Getting tired easily during exercise.  In some cases, there are no symptoms. How is this diagnosed? Your health care provider may be able to detect atrial fibrillation when taking your pulse. If detected, this condition may be diagnosed with:  An electrocardiogram (ECG).  A Holter monitor test that records your heartbeat patterns over a 24-hour period.  Transthoracic echocardiogram (TTE) to evaluate how blood flows through your heart.  Transesophageal echocardiogram (TEE) to view more detailed images of your heart.  A stress test.  Imaging tests, such as a CT scan or chest X-ray.  Blood tests.  How is this treated? The main goals of treatment are to prevent blood clots from forming and to keep your heart beating at a normal rate and rhythm. The type of treatment that you receive depends on many factors, such as your underlying medical conditions and how you feel when you are experiencing atrial fibrillation. This condition may be treated with:  Medicine to slow down the heart rate, bring the heart's rhythm back to normal, or prevent clots from forming.  Electrical cardioversion. This is a procedure that resets your heart's rhythm by delivering a controlled, low-energy shock to the heart through your skin.  Different types of ablation, such as catheter ablation, catheter ablation with pacemaker, or surgical ablation. These procedures destroy the heart tissues that send abnormal signals. When the pacemaker is used, it is placed under your skin to help your heart beat in   a regular rhythm.  Follow these instructions at home:  Take over-the counter and prescription medicines only as told by your  health care provider.  If your health care provider prescribed a blood-thinning medicine (anticoagulant), take it exactly as told. Taking too much blood-thinning medicine can cause bleeding. If you do not take enough blood-thinning medicine, you will not have the protection that you need against stroke and other problems.  Do not use tobacco products, including cigarettes, chewing tobacco, and e-cigarettes. If you need help quitting, ask your health care provider.  If you have obstructive sleep apnea, manage your condition as told by your health care provider.  Do not drink alcohol.  Do not drink beverages that contain caffeine, such as coffee, soda, and tea.  Maintain a healthy weight. Do not use diet pills unless your health care provider approves. Diet pills may make heart problems worse.  Follow diet instructions as told by your health care provider.  Exercise regularly as told by your health care provider.  Keep all follow-up visits as told by your health care provider. This is important. How is this prevented?  Avoid drinking beverages that contain caffeine or alcohol.  Avoid certain medicines, especially medicines that are used for breathing problems.  Avoid certain herbs and herbal medicines, such as those that contain ephedra or ginseng.  Do not use illegal drugs, such as cocaine and amphetamines.  Do not smoke.  Manage your high blood pressure. Contact a health care provider if:  You notice a change in the rate, rhythm, or strength of your heartbeat.  You are taking an anticoagulant and you notice increased bruising.  You tire more easily when you exercise or exert yourself. Get help right away if:  You have chest pain, abdominal pain, sweating, or weakness.  You feel nauseous.  You notice blood in your vomit, bowel movement, or urine.  You have shortness of breath.  You suddenly have swollen feet and ankles.  You feel dizzy.  You have sudden weakness or  numbness of the face, arm, or leg, especially on one side of the body.  You have trouble speaking, trouble understanding, or both (aphasia).  Your face or your eyelid droops on one side. These symptoms may represent a serious problem that is an emergency. Do not wait to see if the symptoms will go away. Get medical help right away. Call your local emergency services (911 in the [place] Do not drive yourself to the hospital. This information is not intended to replace advice given to you by your health care provider. Make sure you discuss any questions you have with your health care provider. Document Released: 09/08/2005 Document Revised: 01/16/2016 Document Reviewed: 01/03/2015 Elsevier Interactive Patient Education  2017 [employer]  

## 2017-07-14 NOTE — Progress Notes (Signed)
Subjective:    Patient ID: Sabrina [name], female    DOB: Nov 25, 1945, 71 y.o.   MRN: [id number]  Pt presents to the office today for chronic follow up. PT is followed by Cardiologists annually for A Fib, CHF, and HTN. Pt requesting to transfer her Cardiologists to [place] instead of [place]. States it is hard for her to drive through [place]. Pt has not been taking her Eliquis or cardizem, because she felt like "they were not working".   Pt states she saw an Opthalmology's  who told her she needed to be "checked for diabetes".   Hypertension  This is a chronic problem. The current episode started more than 1 year ago. The problem has been resolved since onset. The problem is controlled. Associated symptoms include anxiety and malaise/fatigue. Pertinent negatives include no peripheral edema or shortness of breath. Risk factors for coronary artery disease include dyslipidemia, post-menopausal state, sedentary lifestyle and family history. Past treatments include nothing. The current treatment provides moderate improvement. Hypertensive end-organ damage includes heart failure. There is no history of kidney disease, CAD/MI or CVA.  Hyperlipidemia  This is a chronic problem. The current episode started more than 1 year ago. The problem is uncontrolled. Recent lipid tests were reviewed and are high. Pertinent negatives include no shortness of breath. She is currently on no antihyperlipidemic treatment. The current treatment provides no improvement of lipids. Risk factors for coronary artery disease include family history, hypertension, post-menopausal and a sedentary lifestyle.  Depression         This is a chronic problem.  The current episode started more than 1 year ago.   The onset quality is gradual.   The problem occurs constantly.  Associated symptoms include decreased concentration, fatigue, helplessness, hopelessness, insomnia, irritable, decreased interest and sad.  Past treatments  include SNRIs - Serotonin and norepinephrine reuptake inhibitors.  Compliance with treatment is good.  Past medical history includes anxiety.   Anxiety  Presents for follow-up visit. Symptoms include decreased concentration, excessive worry, insomnia, irritability and nervous/anxious behavior. Patient reports no confusion or shortness of breath. Symptoms occur most days.   Her past medical history is significant for anemia and asthma.  Insomnia  Primary symptoms: difficulty falling asleep, frequent awakening, malaise/fatigue.  The current episode started more than one year. The onset quality is gradual. The problem occurs intermittently. The problem has been waxing and waning since onset. PMH includes: depression.  Anemia  Presents for follow-up visit. Symptoms include malaise/fatigue. There has been no bruising/bleeding easily, confusion or leg swelling. Past medical history includes heart failure.  Gastroesophageal Reflux  She complains of coughing. She reports no belching, no choking, no heartburn or no wheezing. This is a chronic problem. The current episode started more than 1 year ago. The problem occurs occasionally. The problem has been waxing and waning. The symptoms are aggravated by certain foods. Associated symptoms include fatigue. She has tried a PPI and a diet change for the symptoms. The treatment provided moderate relief.  Asthma  She complains of cough and frequent throat clearing. There is no shortness of breath or wheezing. This is a chronic problem. The current episode started more than 1 year ago. The problem has been waxing and waning. Associated symptoms include malaise/fatigue. Pertinent negatives include no heartburn. Her symptoms are aggravated by pollen. Her past medical history is significant for asthma.  Congestive Heart Failure  Presents for follow-up visit. Associated symptoms include fatigue and nocturia. Pertinent negatives include no claudication, edema or shortness  of breath. The symptoms have been stable.  Neuropathy Pt complaining of bilateral burning, cramping pain of 9 out 10. Pt taking gabapentin.     Review of Systems  Constitutional: Positive for fatigue, irritability and malaise/fatigue.  Respiratory: Positive for cough. Negative for choking, shortness of breath and wheezing.   Cardiovascular: Negative for claudication.  Gastrointestinal: Negative for heartburn.  Genitourinary: Positive for nocturia.  Hematological: Does not bruise/bleed easily.  Psychiatric/Behavioral: Positive for decreased concentration and depression. Negative for confusion. The patient is nervous/anxious and has insomnia.   All other systems reviewed and are negative.      Objective:   Physical Exam  Constitutional: She is oriented to person, place, and time. She appears well-developed and well-nourished. She is irritable. No distress.  HENT:  Head: Normocephalic and atraumatic.  Right Ear: External ear normal.  Mouth/Throat: Oropharynx is clear and moist.  Eyes: Pupils are equal, round, and reactive to light.  Neck: Normal range of motion. Neck supple. No thyromegaly present.  Cardiovascular: Normal heart sounds and intact distal pulses.  An irregularly irregular rhythm present. Tachycardia present.   No murmur heard. Pulmonary/Chest: Effort normal and breath sounds normal. No respiratory distress. She has no wheezes.  Abdominal: Soft. Bowel sounds are normal. She exhibits no distension. There is no tenderness.  Musculoskeletal: Normal range of motion. She exhibits no edema or tenderness.  Neurological: She is alert and oriented to person, place, and time.  Skin: Skin is warm and dry.  Psychiatric: She has a normal mood and affect. Her behavior is normal. Judgment and thought content normal.  Vitals reviewed.   BP 135/86   Pulse (!) 131   Temp (!) 96.8 F (36 C) (Oral)   Ht 5' 4.5" (1.638 m)   Wt 183 lb 12.8 oz (83.4 kg)   BMI 31.06 kg/m        Assessment & Plan:  1. Iron deficiency anemia, unspecified iron deficiency anemia type - CMP14+EGFR - Anemia Profile B  2. Gastroesophageal reflux disease, esophagitis presence not specified - CMP14+EGFR  3. Generalized anxiety disorder - CMP14+EGFR  4. Moderate episode of recurrent major depressive disorder ([place]) - CMP14+EGFR - Ambulatory referral to Cardiology  5. Mild intermittent asthma without complication  - ZOX09+UEAV  6. Chronic diastolic heart failure (HCC) - CMP14+EGFR  7. Vitamin D deficiency  - CMP14+EGFR  8. Other polyneuropathy  - CMP14+EGFR  9. Morbid obesity ([place]) - CMP14+EGFR  10. Insomnia, unspecified type  - CMP14+EGFR  11. Mixed hyperlipidemia  - CMP14+EGFR - Lipid panel  12. Eczema, unspecified type  - CMP14+EGFR  13. Atrial fibrillation with RVR ([place]) - CMP14+EGFR - Ambulatory referral to Cardiology  14. Benign essential HTN - CMP14+EGFR  15. Colon cancer screening  - Fecal occult blood, imunochemical; Future   Long discussion with patient about the importance of taking medications!! Pt will restart Cardizem and Eliquis  Pt states she can not go to ED because she cares for her autistic grandson, will placed stat Cardiologists referral in [place].  Pt agrees to keep appt, she denies any SOB, dizzy, or chest pain at this time.  RTI in 1 month  Continue all meds Labs pending Health Maintenance reviewed Diet and exercise encouraged RTO 1 month   Evelina Dun, FNP

## 2017-07-15 LAB — CMP14+EGFR
A/G RATIO: 1.8 (ref 1.2–2.2)
ALBUMIN: 4.2 g/dL (ref 3.6–4.8)
ALT: 13 IU/L (ref 0–32)
AST: 22 IU/L (ref 0–40)
Alkaline Phosphatase: 133 IU/L — ABNORMAL HIGH (ref 39–117)
BILIRUBIN TOTAL: 0.9 mg/dL (ref 0.0–1.2)
BUN / CREAT RATIO: 16 (ref 12–28)
BUN: 13 mg/dL (ref 8–27)
CALCIUM: 9.3 mg/dL (ref 8.7–10.3)
CHLORIDE: 102 mmol/L (ref 96–106)
CO2: 23 mmol/L (ref 20–29)
Creatinine, Ser: 0.81 mg/dL (ref 0.57–1.00)
GFR, EST AFRICAN AMERICAN: 86 mL/min/{1.73_m2} (ref >59)
GFR, EST NON AFRICAN AMERICAN: 74 mL/min/{1.73_m2} (ref >59)
GLOBULIN, TOTAL: 2.4 g/dL (ref 1.5–4.5)
Glucose: 104 mg/dL — ABNORMAL HIGH (ref 65–99)
POTASSIUM: 5.1 mmol/L (ref 3.5–5.2)
SODIUM: 141 mmol/L (ref 134–144)
TOTAL PROTEIN: 6.6 g/dL (ref 6.0–8.5)

## 2017-07-15 LAB — LIPID PANEL
CHOL/HDL RATIO: 3.2 ratio (ref 0.0–4.4)
Cholesterol, Total: 215 mg/dL — ABNORMAL HIGH (ref 100–199)
HDL: 67 mg/dL (ref >39)
LDL Calculated: 125 mg/dL — ABNORMAL HIGH (ref 0–99)
Triglycerides: 116 mg/dL (ref 0–149)
VLDL Cholesterol Cal: 23 mg/dL (ref 5–40)

## 2017-07-15 LAB — ANEMIA PROFILE B
Basophils Absolute: 0.1 10*3/uL (ref 0.0–0.2)
Basos: 1 %
EOS (ABSOLUTE): 0.2 10*3/uL (ref 0.0–0.4)
EOS: 2 %
FERRITIN: 17 ng/mL (ref 15–150)
HEMOGLOBIN: 14.6 g/dL (ref 11.1–15.9)
Hematocrit: 44.7 % (ref 34.0–46.6)
IMMATURE GRANULOCYTES: 0 %
IRON SATURATION: 30 % (ref 15–55)
Immature Grans (Abs): 0 10*3/uL (ref 0.0–0.1)
Iron: 111 ug/dL (ref 27–139)
LYMPHS ABS: 2.2 10*3/uL (ref 0.7–3.1)
Lymphs: 27 %
MCH: 29.6 pg (ref 26.6–33.0)
MCHC: 32.7 g/dL (ref 31.5–35.7)
MCV: 91 fL (ref 79–97)
MONOS ABS: 0.5 10*3/uL (ref 0.1–0.9)
Monocytes: 6 %
NEUTROS PCT: 64 %
Neutrophils Absolute: 5.3 10*3/uL (ref 1.4–7.0)
Platelets: 372 10*3/uL (ref 150–379)
RBC: 4.93 x10E6/uL (ref 3.77–5.28)
RDW: 13.3 % (ref 12.3–15.4)
Retic Ct Pct: 1.1 % (ref 0.6–2.6)
Total Iron Binding Capacity: 375 ug/dL (ref 250–450)
UIBC: 264 ug/dL (ref 118–369)
VITAMIN B 12: 365 pg/mL (ref 232–1245)
WBC: 8.2 10*3/uL (ref 3.4–10.8)

## 2017-07-21 ENCOUNTER — Encounter: Payer: Self-pay | Admitting: Cardiovascular Disease

## 2017-07-21 ENCOUNTER — Ambulatory Visit (INDEPENDENT_AMBULATORY_CARE_PROVIDER_SITE_OTHER): Payer: Medicare HMO | Admitting: Cardiovascular Disease

## 2017-07-21 VITALS — BP 120/78 | HR 92 | Ht 64.0 in | Wt 184.0 lb

## 2017-07-21 DIAGNOSIS — I4891 Unspecified atrial fibrillation: Secondary | ICD-10-CM | POA: Diagnosis not present

## 2017-07-21 DIAGNOSIS — Z7901 Long term (current) use of anticoagulants: Secondary | ICD-10-CM | POA: Diagnosis not present

## 2017-07-21 MED ORDER — DILTIAZEM HCL ER COATED BEADS 240 MG PO CP24: 240.0000 mg | ORAL_CAPSULE | Freq: Every day | ORAL | 6 refills | Status: DC

## 2017-07-21 NOTE — Patient Instructions (Signed)
Medication Instructions:   Increase Diltiazem CD to 240mg  daily.  Continue all other medications.    Labwork: none  Testing/Procedures: none  Follow-Up: 6 weeks   Any Other Special Instructions Will Be Listed Below (If Applicable).  If you need a refill on your cardiac medications before your next appointment, please call your pharmacy.

## 2017-07-21 NOTE — Progress Notes (Signed)
SUBJECTIVE: The patient presents for follow-up of paroxysmal atrial fibrillation.  She is on diltiazem and Eliquis.  Echocardiogram 06/13/16: Normal left ventricular systolic function and regional wall motion, LVEF 60-65%, mild LVH, mild mitral regurgitation, mild tricuspid regurgitation, and severe left atrial dilatation.  Nuclear stress test in September 2017 was normal.  Denies chest pain but she is short of breath walking from our office parking lot into the office.  She has palpitations.  She appears anxious.    Review of Systems: As per "subjective", otherwise negative.  Allergies  Allergen Reactions  . Codeine Nausea And Vomiting    Stomach pain    Current Outpatient Prescriptions  Medication Sig Dispense Refill  . atorvastatin (LIPITOR) 20 MG tablet Take 1 tablet (20 mg total) by mouth daily. 30 tablet 11  . cyclobenzaprine (FLEXERIL) 5 MG tablet Take 1 tablet (5 mg total) by mouth 3 (three) times daily as needed for muscle spasms. 30 tablet 1  . diltiazem (CARDIZEM CD) 180 MG 24 hr capsule TAKE 1 CAPSULE (180 MG TOTAL) BY MOUTH DAILY. 30 capsule 6  . DULoxetine (CYMBALTA) 60 MG capsule TAKE 1 CAPSULE (60 MG TOTAL) BY MOUTH DAILY. 30 capsule 1  . ELIQUIS 5 MG TABS tablet TAKE 1 TABLET (5 MG TOTAL) BY MOUTH 2 (TWO) TIMES DAILY. 60 tablet 5  . ferrous sulfate 325 (65 FE) MG tablet Take 325 mg by mouth 3 (three) times a week.    . fluticasone (FLONASE) 50 MCG/ACT nasal spray Place 2 sprays into both nostrils daily. 16 g 5  . gabapentin (NEURONTIN) 100 MG capsule Take 1 capsule (100 mg total) by mouth 2 (two) times daily. 60 capsule 3   No current facility-administered medications for this visit.     Past Medical History:  Diagnosis Date  . Allergy   . Anemia   . Ankle fracture, right    past hx. -"no surgery"  . Anxiety   . Arthritis   . Cataract   . Chronic diastolic heart failure ([place])   . Collagen vascular disease ([place])   . Depression   . Dysrhythmia    Tachycardia, Atrial Fib episode noted x1- 9'17.Dr. Acie Fredrickson note [phone] Epic.  . Fibromyalgia   . Fistula   . GERD (gastroesophageal reflux disease)   . Hyperlipidemia   . Hypertension   . Neuromuscular disorder (HCC)    feet, legs, hands- tx. Gabapentin  . Neuropathy    legs and feet  . Tubular adenoma of colon   . UTI (urinary tract infection)    frequent UTI- tx. recently.    Past Surgical History:  Procedure Laterality Date  . ABDOMINAL HYSTERECTOMY    . APPENDECTOMY    . broken hip     left; metal  . CHOLECYSTECTOMY  1978  . GASTRIC BYPASS  2002   [place]  . LAPAROSCOPY  08/22/2016   Procedure: LAPAROSCOPY DIAGNOSTIC;  Surgeon: [name], MD;  Location: [facility];  Service: General;;  . SPLENECTOMY  2002  . TONSILLECTOMY  1963    Social History   Social History  . Marital status: Divorced    Spouse name: N/A  . Number of children: N/A  . Years of education: N/A   Occupational History  . Not on file.   Social History Main Topics  . Smoking status: Former Smoker    Quit date: 02/26/1993  . Smokeless tobacco: Never Used  . Alcohol use No  . Drug use: No  .  Sexual activity: No   Other Topics Concern  . Not on file   Social History Narrative  . No narrative on file     Vitals:   07/21/17 1411  BP: 120/78  Pulse: 92  SpO2: 96%  Weight: 184 lb (83.5 kg)  Height: 5\' 4"  (1.626 m)    Wt Readings from Last 3 Encounters:  07/21/17 184 lb (83.5 kg)  07/14/17 183 lb 12.8 oz (83.4 kg)  05/19/17 184 lb 12.8 oz (83.8 kg)     PHYSICAL EXAM General: NAD HEENT: Normal. Neck: No JVD, no thyromegaly. Lungs: Clear to auscultation bilaterally with normal respiratory effort. CV: Tachycardic, irregular rhythm, normal S1/S2, no S3, no murmur. No pretibial or periankle edema.   Abdomen: Soft, nontender, no distention.  Neurologic: Alert and oriented.  Psych: Normal affect. Skin: Normal. Musculoskeletal: No gross deformities.    ECG: Most  recent ECG reviewed.   Labs: Lab Results  Component Value Date/Time   K 5.1 07/14/2017 11:40 AM   BUN 13 07/14/2017 11:40 AM   CREATININE 0.81 07/14/2017 11:40 AM   CREATININE 0.76 10/03/2014 11:46 AM   ALT 13 07/14/2017 11:40 AM   TSH 2.590 09/25/2016 12:10 PM   HGB 14.6 07/14/2017 11:40 AM     Lipids: Lab Results  Component Value Date/Time   LDLCALC 125 (H) 07/14/2017 11:40 AM   CHOL 215 (H) 07/14/2017 11:40 AM   TRIG 116 07/14/2017 11:40 AM   HDL 67 07/14/2017 11:40 AM       ASSESSMENT AND PLAN: 1.  Rapid atrial fibrillation with palpitations and exertional dyspnea: I will increase diltiazem from 180 up to 240 mg daily.  She is anticoagulated with Eliquis.       Disposition: Follow up 6 weeks.   Kate Sable, M.D., F.A.C.C.

## 2017-08-19 ENCOUNTER — Other Ambulatory Visit: Payer: Self-pay | Admitting: Family

## 2017-08-20 NOTE — Telephone Encounter (Signed)
Last Vit D 10/19/15  14.5

## 2017-08-21 ENCOUNTER — Other Ambulatory Visit: Payer: Self-pay

## 2017-08-21 ENCOUNTER — Encounter (HOSPITAL_COMMUNITY): Payer: Self-pay | Admitting: *Deleted

## 2017-08-21 ENCOUNTER — Emergency Department (HOSPITAL_COMMUNITY): Payer: Medicare HMO

## 2017-08-21 ENCOUNTER — Ambulatory Visit (INDEPENDENT_AMBULATORY_CARE_PROVIDER_SITE_OTHER): Payer: Medicare HMO | Admitting: Cardiovascular Disease

## 2017-08-21 ENCOUNTER — Encounter: Payer: Self-pay | Admitting: Cardiovascular Disease

## 2017-08-21 ENCOUNTER — Inpatient Hospital Stay (HOSPITAL_COMMUNITY)
Admission: EM | Admit: 2017-08-21 | Discharge: 2017-08-24 | DRG: 308 | Disposition: A | Discharge status: Home / Self Care | Payer: Medicare HMO | Attending: Cardiovascular Disease | Admitting: Cardiovascular Disease

## 2017-08-21 VITALS — BP 98/60 | HR 98 | Ht 64.0 in | Wt 183.0 lb

## 2017-08-21 DIAGNOSIS — G629 Polyneuropathy, unspecified: Secondary | ICD-10-CM | POA: Diagnosis present

## 2017-08-21 DIAGNOSIS — Z9081 Acquired absence of spleen: Secondary | ICD-10-CM

## 2017-08-21 DIAGNOSIS — Z6831 Body mass index (BMI) 31.0-31.9, adult: Secondary | ICD-10-CM

## 2017-08-21 DIAGNOSIS — E876 Hypokalemia: Secondary | ICD-10-CM | POA: Diagnosis present

## 2017-08-21 DIAGNOSIS — M797 Fibromyalgia: Secondary | ICD-10-CM | POA: Diagnosis present

## 2017-08-21 DIAGNOSIS — F329 Major depressive disorder, single episode, unspecified: Secondary | ICD-10-CM | POA: Diagnosis present

## 2017-08-21 DIAGNOSIS — I4891 Unspecified atrial fibrillation: Secondary | ICD-10-CM | POA: Diagnosis present

## 2017-08-21 DIAGNOSIS — I110 Hypertensive heart disease with heart failure: Secondary | ICD-10-CM | POA: Diagnosis present

## 2017-08-21 DIAGNOSIS — E559 Vitamin D deficiency, unspecified: Secondary | ICD-10-CM | POA: Diagnosis present

## 2017-08-21 DIAGNOSIS — I481 Persistent atrial fibrillation: Secondary | ICD-10-CM | POA: Diagnosis not present

## 2017-08-21 DIAGNOSIS — R079 Chest pain, unspecified: Secondary | ICD-10-CM | POA: Diagnosis not present

## 2017-08-21 DIAGNOSIS — Z90710 Acquired absence of both cervix and uterus: Secondary | ICD-10-CM | POA: Diagnosis not present

## 2017-08-21 DIAGNOSIS — Z885 Allergy status to narcotic agent status: Secondary | ICD-10-CM | POA: Diagnosis not present

## 2017-08-21 DIAGNOSIS — Z9049 Acquired absence of other specified parts of digestive tract: Secondary | ICD-10-CM

## 2017-08-21 DIAGNOSIS — I5033 Acute on chronic diastolic (congestive) heart failure: Secondary | ICD-10-CM | POA: Diagnosis present

## 2017-08-21 DIAGNOSIS — F419 Anxiety disorder, unspecified: Secondary | ICD-10-CM | POA: Diagnosis not present

## 2017-08-21 DIAGNOSIS — J449 Chronic obstructive pulmonary disease, unspecified: Secondary | ICD-10-CM | POA: Diagnosis present

## 2017-08-21 DIAGNOSIS — Z8261 Family history of arthritis: Secondary | ICD-10-CM

## 2017-08-21 DIAGNOSIS — Z9884 Bariatric surgery status: Secondary | ICD-10-CM

## 2017-08-21 DIAGNOSIS — Z7951 Long term (current) use of inhaled steroids: Secondary | ICD-10-CM | POA: Diagnosis not present

## 2017-08-21 DIAGNOSIS — Z800 Family history of malignant neoplasm of digestive organs: Secondary | ICD-10-CM

## 2017-08-21 DIAGNOSIS — E785 Hyperlipidemia, unspecified: Secondary | ICD-10-CM | POA: Diagnosis present

## 2017-08-21 DIAGNOSIS — I959 Hypotension, unspecified: Secondary | ICD-10-CM

## 2017-08-21 DIAGNOSIS — I480 Paroxysmal atrial fibrillation: Secondary | ICD-10-CM | POA: Diagnosis not present

## 2017-08-21 DIAGNOSIS — K219 Gastro-esophageal reflux disease without esophagitis: Secondary | ICD-10-CM | POA: Diagnosis present

## 2017-08-21 DIAGNOSIS — L309 Dermatitis, unspecified: Secondary | ICD-10-CM | POA: Diagnosis present

## 2017-08-21 DIAGNOSIS — Z820 Family history of epilepsy and other diseases of the nervous system: Secondary | ICD-10-CM

## 2017-08-21 DIAGNOSIS — Z7901 Long term (current) use of anticoagulants: Secondary | ICD-10-CM | POA: Diagnosis not present

## 2017-08-21 DIAGNOSIS — R55 Syncope and collapse: Secondary | ICD-10-CM | POA: Diagnosis not present

## 2017-08-21 DIAGNOSIS — R0602 Shortness of breath: Secondary | ICD-10-CM | POA: Diagnosis not present

## 2017-08-21 DIAGNOSIS — E669 Obesity, unspecified: Secondary | ICD-10-CM | POA: Diagnosis present

## 2017-08-21 DIAGNOSIS — Z87891 Personal history of nicotine dependence: Secondary | ICD-10-CM

## 2017-08-21 DIAGNOSIS — I482 Chronic atrial fibrillation: Secondary | ICD-10-CM | POA: Diagnosis not present

## 2017-08-21 LAB — BASIC METABOLIC PANEL
ANION GAP: 11 (ref 5–15)
BUN: 25 mg/dL — ABNORMAL HIGH (ref 6–20)
CALCIUM: 9 mg/dL (ref 8.9–10.3)
CHLORIDE: 99 mmol/L — AB (ref 101–111)
CO2: 24 mmol/L (ref 22–32)
Creatinine, Ser: 0.89 mg/dL (ref 0.44–1.00)
GFR calc non Af Amer: >60 mL/min (ref >60)
GLUCOSE: 109 mg/dL — AB (ref 65–99)
POTASSIUM: 3.8 mmol/L (ref 3.5–5.1)
Sodium: 134 mmol/L — ABNORMAL LOW (ref 135–145)

## 2017-08-21 LAB — CBC
HEMATOCRIT: 47.3 % — AB (ref 36.0–46.0)
HEMOGLOBIN: 15.8 g/dL — AB (ref 12.0–15.0)
MCH: 30.5 pg (ref 26.0–34.0)
MCHC: 33.4 g/dL (ref 30.0–36.0)
MCV: 91.3 fL (ref 78.0–100.0)
Platelets: 389 10*3/uL (ref 150–400)
RBC: 5.18 MIL/uL — AB (ref 3.87–5.11)
RDW: 13.7 % (ref 11.5–15.5)
WBC: 10.8 10*3/uL — ABNORMAL HIGH (ref 4.0–10.5)

## 2017-08-21 LAB — I-STAT TROPONIN, ED: TROPONIN I, POC: 0 ng/mL (ref 0.00–0.08)

## 2017-08-21 MED ORDER — SODIUM CHLORIDE 0.9 % IV BOLUS (SEPSIS)
500.0000 mL | Freq: Once | INTRAVENOUS | Status: AC
  Administered 2017-08-21: 500 mL via INTRAVENOUS

## 2017-08-21 MED ORDER — DULOXETINE HCL 60 MG PO CPEP
60.0000 mg | ORAL_CAPSULE | Freq: Every day | ORAL | Status: DC
Start: 2017-08-22 — End: 2017-08-24
  Administered 2017-08-22 – 2017-08-23 (×2): 60 mg via ORAL
  Filled 2017-08-21 (×3): qty 1

## 2017-08-21 MED ORDER — ONDANSETRON HCL 4 MG/2ML IJ SOLN
4.0000 mg | Freq: Four times a day (QID) | INTRAMUSCULAR | Status: DC | PRN
  Administered 2017-08-23: 4 mg via INTRAVENOUS
  Filled 2017-08-21: qty 2

## 2017-08-21 MED ORDER — FERROUS SULFATE 325 (65 FE) MG PO TABS: 325.0000 mg | ORAL_TABLET | ORAL | Status: DC

## 2017-08-21 MED ORDER — CYCLOBENZAPRINE HCL 10 MG PO TABS: 5.0000 mg | ORAL_TABLET | Freq: Three times a day (TID) | ORAL | Status: DC | PRN

## 2017-08-21 MED ORDER — ACETAMINOPHEN 325 MG PO TABS: 650.0000 mg | ORAL_TABLET | ORAL | Status: DC | PRN

## 2017-08-21 MED ORDER — DILTIAZEM HCL 100 MG IV SOLR
5.0000 mg/h | Freq: Once | INTRAVENOUS | Status: AC
  Administered 2017-08-21: 5 mg/h via INTRAVENOUS
  Filled 2017-08-21: qty 100

## 2017-08-21 MED ORDER — ATORVASTATIN CALCIUM 20 MG PO TABS
20.0000 mg | ORAL_TABLET | Freq: Every day | ORAL | Status: DC
Start: 2017-08-22 — End: 2017-08-24
  Administered 2017-08-22 – 2017-08-23 (×2): 20 mg via ORAL
  Filled 2017-08-21 (×5): qty 1

## 2017-08-21 MED ORDER — APIXABAN 5 MG PO TABS
5.0000 mg | ORAL_TABLET | Freq: Two times a day (BID) | ORAL | Status: DC
  Administered 2017-08-22 – 2017-08-24 (×6): 5 mg via ORAL
  Filled 2017-08-21 (×8): qty 1

## 2017-08-21 MED ORDER — GABAPENTIN 100 MG PO CAPS
100.0000 mg | ORAL_CAPSULE | Freq: Two times a day (BID) | ORAL | Status: DC
  Administered 2017-08-22 – 2017-08-24 (×6): 100 mg via ORAL
  Filled 2017-08-21 (×6): qty 1

## 2017-08-21 MED ORDER — FLUTICASONE PROPIONATE 50 MCG/ACT NA SUSP: 2.0000 | Freq: Every day | NASAL | Status: DC | PRN

## 2017-08-21 NOTE — ED Notes (Addendum)
Pt in triage waiting room.  States her chest pain had resolved since arrival but is now 7/10 sharp central chest pain with sob and feeling hot.  Pt taken to triage for repeat EKG.  Pt to go to treatment room after repeat EKG.

## 2017-08-21 NOTE — H&P (Signed)
Patient ID: ZENNA TRAISTER MRN: [id number], DOB/AGE: 1945/11/26  Admit date: 08/21/2017 Primary Physician: Sharion Balloon, FNP Primary Cardiologist: Bronson Ing, MD  CARDIOLOGY ADMISSION History & Physical   PATIENT PROFILE   71 yo F with history of atrial fibrillation, severely dilated LA, anxiety, presents as a directed by Dr. Bronson Ing for EP evaluation. The patient has had difficult to control atrial fibrillation; Diltiazem doses have been limited by low blood pressures and orthostatic symptoms.   HISTORY OF PRESENT ILLNESS   The patient tells me that she has noticed palpitations, decreased energy, and dyspnea with her atrial fibrillation. She lives independently and does not use cane/walker, but cannot walk more than 1/2 block before having to stop and rest due to dyspnea. She has experienced occasional chest discomfort (at rest) and 2 episodes of near-syncope in the past 2-3 weeks. No fevers, chills, n/v/d, rashes, dysuria. No LE edema. She does have night-time syncope and has "self-diagnosed" sleep apnea.  In the ED she is stable with HR in the 120s and SBP in the 110s. She received NS 500 CC bolus and was started on Diltiazem gtt. Initial lab workup largely unremarkable.  Review of Systems General:  No chills, fever, night sweats or weight changes.  Cardiovascular: See HPI Dermatological: No rash, lesions/masses Respiratory: No cough, dyspnea Urologic: No hematuria, dysuria Abdominal:   No nausea, vomiting, diarrhea, bright red blood per rectum, melena, or hematemesis Neurologic:  No visual changes, wkns, changes in mental status. All other systems reviewed and are otherwise negative except as noted above.   MEDICAL, FAMILY, AND SOCIAL HISTORY   Past Medical History:  Diagnosis Date  . Allergy   . Anemia   . Ankle fracture, right    past hx. -"no surgery"  . Anxiety   . Arthritis   . Cataract   . Chronic diastolic heart failure ([place])   . Collagen vascular  disease ([place])   . Depression   . Dysrhythmia    Tachycardia, Atrial Fib episode noted x1- 9'17.Dr. Acie Fredrickson note [phone] Epic.  . Fibromyalgia   . Fistula   . GERD (gastroesophageal reflux disease)   . Hyperlipidemia   . Hypertension   . Neuromuscular disorder (HCC)    feet, legs, hands- tx. Gabapentin  . Neuropathy    legs and feet  . Tubular adenoma of colon   . UTI (urinary tract infection)    frequent UTI- tx. recently.    Past Surgical History:  Procedure Laterality Date  . ABDOMINAL HYSTERECTOMY    . APPENDECTOMY    . broken hip     left; metal  . CHOLECYSTECTOMY  1978  . GASTRIC BYPASS  2002   [place]  . LAPAROSCOPY  08/22/2016   Procedure: LAPAROSCOPY DIAGNOSTIC;  Surgeon: [name], MD;  Location: [facility];  Service: General;;  . SPLENECTOMY  2002  . TONSILLECTOMY  1963    Allergies  Allergen Reactions  . Codeine Nausea And Vomiting    Stomach pain also   Prior to Admission medications   Medication Sig Start Date End Date Taking? Authorizing Provider  atorvastatin (LIPITOR) 20 MG tablet Take 20 mg by mouth daily.   Yes [provider]  diltiazem (CARDIZEM CD) 240 MG 24 hr capsule Take 1 capsule (240 mg total) by mouth daily. 07/21/17  Yes Herminio Commons, MD  cyclobenzaprine (FLEXERIL) 5 MG tablet Take 1 tablet (5 mg total) by mouth 3 (three) times daily as needed for muscle spasms. 05/19/17  Hassell Done, Mary-Margaret, FNP  DULoxetine (CYMBALTA) 60 MG capsule TAKE 1 CAPSULE (60 MG TOTAL) BY MOUTH DAILY. 03/30/17   Hawks, Christy A, FNP  ELIQUIS 5 MG TABS tablet TAKE 1 TABLET (5 MG TOTAL) BY MOUTH 2 (TWO) TIMES DAILY. 09/12/16   Nahser, Wonda Cheng, MD  ferrous sulfate 325 (65 FE) MG tablet Take 325 mg by mouth 3 (three) times a week.    [provider]  fluticasone (FLONASE) 50 MCG/ACT nasal spray Place 2 sprays into both nostrils daily. 11/20/14   Collene Gobble, MD  gabapentin (NEURONTIN) 100 MG capsule Take 1 capsule (100 mg  total) by mouth 2 (two) times daily. 05/19/17   Chevis Pretty, FNP   Family History  Problem Relation Age of Onset  . Cancer Mother   . Alzheimer's disease Mother   . Arthritis Father   . Colon cancer Paternal Uncle 28   Social History   Socioeconomic History  . Marital status: Divorced    Spouse name: Not on file  . Number of children: Not on file  . Years of education: Not on file  . Highest education level: Not on file  Social Needs  . Financial resource strain: Not on file  . Food insecurity - worry: Not on file  . Food insecurity - inability: Not on file  . Transportation needs - medical: Not on file  . Transportation needs - non-medical: Not on file  Occupational History  . Not on file  Tobacco Use  . Smoking status: Former Smoker    Last attempt to quit: 02/26/1993    Years since quitting: 24.4  . Smokeless tobacco: Never Used  Substance and Sexual Activity  . Alcohol use: No    Alcohol/week: 0.0 oz  . Drug use: No  . Sexual activity: No    Birth control/protection: Surgical  Other Topics Concern  . Not on file  Social History Narrative  . Not on file      PHYSICAL EXAM  Blood pressure (!) 119/95, pulse 86, temperature 97.6 F (36.4 C), temperature source Oral, resp. rate 15, SpO2 95 %.  General: Pleasant, NAD Psych: Normal affect. Neuro: Alert and oriented X 3. Moves all extremities spontaneously. HEENT: Sclera anicteric, noninjected. MMM.  Neck: no bruits. JVP is not elevated. Lungs:  Normal wob, clear to auscultation Heart: tachycardic, irregular, no m/r/g Abdomen: Soft, non-tender, non-distended, BS + x 4.  Extremities: No clubbing or cyanosis. Edema: none. DP/PT/Radials 2+ and equal bilaterally.   LABS and STUDIES  Troponin (Point of Care Test) Recent Labs    08/21/17 1613  TROPIPOC 0.00   No results for input(s): CKTOTAL, CKMB, TROPONINI in the last 72 hours. Lab Results  Component Value Date   WBC 10.8 (H) 08/21/2017   HGB 15.8 (H)  08/21/2017   HCT 47.3 (H) 08/21/2017   MCV 91.3 08/21/2017   PLT 389 08/21/2017    Recent Labs  Lab 08/21/17 1549  NA 134*  K 3.8  CL 99*  CO2 24  BUN 25*  CREATININE 0.89  CALCIUM 9.0  GLUCOSE 109*   Lab Results  Component Value Date   CHOL 215 (H) 07/14/2017   HDL 67 07/14/2017   LDLCALC 125 (H) 07/14/2017   TRIG 116 07/14/2017   No results found for: DDIMER   Radiology/Studies. Independently Reviewed CXR: clear lung fields  ECG was independently reviewed. Afib with RVR, vent rate 125 bpm, no ischemic changes  Echocardiogram 06/13/17 Mild LVH, EF 60-65%, mild MR, severely dilated LA,  normal RV and RA, mild TR   ASSESSMENT and PLAN   Ms. Randolm Idol is a pleasant 71 yo F with difficult to control atrial fibrillation who presents for stabilization and EP evaluation.  Afib - Currently rate controlled with Diltiazem gtt - Plan for rate control overnight and EP consult in AM to discuss rhythm control strategies (ie.g antiarrhythmics vs ablation) - Anticoagulated with Eliquis -This patients CHA2DS2-VASc Score and unadjusted Ischemic Stroke Rate (% per year) is equal to 2.2 % stroke rate/year from a score of 2 Above score calculated as 1 point each if present [CHF, HTN, DM, Vascular=MI/PAD/Aortic Plaque, Age if 65-74, or Female] Above score calculated as 2 points each if present [Age > 75, or Stroke/TIA/TE] - Potassium and Magnesium repletion prn - TSH, urinalysis pending - Consider outpatient sleep study for ? Sleep apnea  Peripheral Neuropathy: Gabapentin, Flexeril  Depression/Anxiety: Duloxetine  IVF: s/p 500 cc bolus Diet: low sodium DVT PPX: full dose anticogulation Dispo: Admit to stepdown Code Status: Full   PAULEY, ERIC D, MD 08/21/2017, 10:28 PM

## 2017-08-21 NOTE — Progress Notes (Signed)
SUBJECTIVE: The patient presents for follow-up of rapid atrial fibrillation.  Echocardiogram in September 2017 demonstrated severe left atrial enlargement with normal left ventricular systolic function.  I increased long-acting diltiazem from 180 to 240 mg at her last visit.  She brought in a list of her blood pressures and heart rates.  Systolic blood pressures have ranged from 19-417 with diastolic readings from 40-814.  She feels weak and tired.  She was driving the other evening and became confused and had blurry vision and broke out in a sweat.  She has had episodic chest pain.  When she was hospitalized last year, diltiazem could not be advanced due to hypotension.  In January of this year, she converted to sinus rhythm with intravenous diltiazem.   Review of Systems: As per "subjective", otherwise negative.  Allergies  Allergen Reactions  . Codeine Nausea And Vomiting    Stomach pain    Current Outpatient Medications  Medication Sig Dispense Refill  . atorvastatin (LIPITOR) 20 MG tablet Take 20 mg by mouth daily.    . cyclobenzaprine (FLEXERIL) 5 MG tablet Take 1 tablet (5 mg total) by mouth 3 (three) times daily as needed for muscle spasms. 30 tablet 1  . diltiazem (CARDIZEM CD) 240 MG 24 hr capsule Take 1 capsule (240 mg total) by mouth daily. 30 capsule 6  . DULoxetine (CYMBALTA) 60 MG capsule TAKE 1 CAPSULE (60 MG TOTAL) BY MOUTH DAILY. 30 capsule 1  . ELIQUIS 5 MG TABS tablet TAKE 1 TABLET (5 MG TOTAL) BY MOUTH 2 (TWO) TIMES DAILY. 60 tablet 5  . ferrous sulfate 325 (65 FE) MG tablet Take 325 mg by mouth 3 (three) times a week.    . fluticasone (FLONASE) 50 MCG/ACT nasal spray Place 2 sprays into both nostrils daily. 16 g 5  . gabapentin (NEURONTIN) 100 MG capsule Take 1 capsule (100 mg total) by mouth 2 (two) times daily. 60 capsule 3   No current facility-administered medications for this visit.     Past Medical History:  Diagnosis Date  . Allergy   .  Anemia   . Ankle fracture, right    past hx. -"no surgery"  . Anxiety   . Arthritis   . Cataract   . Chronic diastolic heart failure ([place])   . Collagen vascular disease ([place])   . Depression   . Dysrhythmia    Tachycardia, Atrial Fib episode noted x1- 9'17.Dr. Acie Fredrickson note [phone] Epic.  . Fibromyalgia   . Fistula   . GERD (gastroesophageal reflux disease)   . Hyperlipidemia   . Hypertension   . Neuromuscular disorder (HCC)    feet, legs, hands- tx. Gabapentin  . Neuropathy    legs and feet  . Tubular adenoma of colon   . UTI (urinary tract infection)    frequent UTI- tx. recently.    Past Surgical History:  Procedure Laterality Date  . ABDOMINAL HYSTERECTOMY    . APPENDECTOMY    . broken hip     left; metal  . CHOLECYSTECTOMY  1978  . GASTRIC BYPASS  2002   [place]  . LAPAROSCOPY  08/22/2016   Procedure: LAPAROSCOPY DIAGNOSTIC;  Surgeon: [name], MD;  Location: [facility];  Service: General;;  . SPLENECTOMY  2002  . TONSILLECTOMY  1963    Social History   Socioeconomic History  . Marital status: Divorced    Spouse name: Not on file  . Number of children: Not on file  .  Years of education: Not on file  . Highest education level: Not on file  Social Needs  . Financial resource strain: Not on file  . Food insecurity - worry: Not on file  . Food insecurity - inability: Not on file  . Transportation needs - medical: Not on file  . Transportation needs - non-medical: Not on file  Occupational History  . Not on file  Tobacco Use  . Smoking status: Former Smoker    Last attempt to quit: 02/26/1993    Years since quitting: 24.4  . Smokeless tobacco: Never Used  Substance and Sexual Activity  . Alcohol use: No    Alcohol/week: 0.0 oz  . Drug use: No  . Sexual activity: No    Birth control/protection: Surgical  Other Topics Concern  . Not on file  Social History Narrative  . Not on file     Vitals:   08/21/17 1257  BP: 98/60  Pulse: 98   SpO2: 97%  Weight: 183 lb (83 kg)  Height: 5\' 4"  (1.626 m)    Wt Readings from Last 3 Encounters:  08/21/17 183 lb (83 kg)  07/21/17 184 lb (83.5 kg)  07/14/17 183 lb 12.8 oz (83.4 kg)    Heart rate by auscultation: 144 bpm.  PHYSICAL EXAM General: NAD HEENT: Normal. Neck: No JVD, no thyromegaly. Lungs: Clear to auscultation bilaterally with normal respiratory effort. CV: Tachycardic, irregular rhythm, normal S1/S2, no S3, no murmur. No pretibial or periankle edema.  Abdomen: Soft, nontender, no distention.  Neurologic: Alert and oriented.  Psych: Normal affect. Skin: Normal. Musculoskeletal: No gross deformities.    ECG: Most recent ECG reviewed.   Labs: Lab Results  Component Value Date/Time   K 5.1 07/14/2017 11:40 AM   BUN 13 07/14/2017 11:40 AM   CREATININE 0.81 07/14/2017 11:40 AM   CREATININE 0.76 10/03/2014 11:46 AM   ALT 13 07/14/2017 11:40 AM   TSH 2.590 09/25/2016 12:10 PM   HGB 14.6 07/14/2017 11:40 AM     Lipids: Lab Results  Component Value Date/Time   LDLCALC 125 (H) 07/14/2017 11:40 AM   CHOL 215 (H) 07/14/2017 11:40 AM   TRIG 116 07/14/2017 11:40 AM   HDL 67 07/14/2017 11:40 AM       ASSESSMENT AND PLAN:  1.  Rapid atrial fibrillation with palpitations and exertional dyspnea: Heart rate is 144 bpm.  At her last visit, I increased diltiazem from 180 up to 240 mg daily.    I am unable to increase it any further due to hypotension.  She is anticoagulated with Eliquis.   Given severe left atrial enlargement, I doubt the ability to convert to sinus rhythm (and/or maintain sinus rhythm if successfully cardioverted) with direct current cardioversion. I have recommended that her family members drive her to [facility] where she can be evaluated by my EP colleagues.  Antiarrhythmic therapy versus AV node ablation with pacemaker may be considered.    Disposition: Follow up after hospitalization.   Kate Sable, M.D., F.A.C.C.

## 2017-08-21 NOTE — Patient Instructions (Signed)
Medication Instructions:    Labwork:   Testing/Procedures:   Follow-Up: Patient sent to [facility].    Any Other Special Instructions Will Be Listed Below (If Applicable).  If you need a refill on your cardiac medications before your next appointment, please call your pharmacy.

## 2017-08-21 NOTE — ED Notes (Signed)
ED Provider at bedside. 

## 2017-08-21 NOTE — ED Triage Notes (Signed)
Pt reports having episode yesterday while driving, "everything went black" and she couldn't remember where she was or what she was doing. That resolved and reports yesterday having lower bp and diaphoresis. Today is having chest pain and sob. Sent here to having cardio consult.

## 2017-08-21 NOTE — ED Provider Notes (Signed)
[facility] EMERGENCY DEPARTMENT Provider Note   CSN: [id number] Arrival date & time: 08/21/17  1524     History   Chief Complaint Chief Complaint  Patient presents with  . Chest Pain  . Shortness of Breath    HPI Sabrina [name] is a 71 y.o. female.  HPI Patient with history of atrial fibrillation presents from her cardiologist office for admission.  Patient had her dose of Cardizem doubled.  Since that time she is been having episodic near syncopal events.  At her cardiologist office she was in atrial fibrillation with rapid rate.  States that while in the waiting room she developed left-sided chest pain she she described as sharp.  She has had ongoing shortness of breath is worse with any exertion.  No nausea or vomiting. Past Medical History:  Diagnosis Date  . Allergy   . Anemia   . Ankle fracture, right    past hx. -"no surgery"  . Anxiety   . Arthritis   . Cataract   . Chronic diastolic heart failure ([place])   . Collagen vascular disease ([place])   . Depression   . Dysrhythmia    Tachycardia, Atrial Fib episode noted x1- 9'17.Dr. Acie Fredrickson note [phone] Epic.  . Fibromyalgia   . Fistula   . GERD (gastroesophageal reflux disease)   . Hyperlipidemia   . Hypertension   . Neuromuscular disorder (HCC)    feet, legs, hands- tx. Gabapentin  . Neuropathy    legs and feet  . Tubular adenoma of colon   . UTI (urinary tract infection)    frequent UTI- tx. recently.    Patient Active Problem List   Diagnosis Date Noted  . Hyperlipidemia 06/23/2016  . Colovesical fistula 06/12/2016  . Atrial fibrillation with RVR ([place]) 06/12/2016  . GERD (gastroesophageal reflux disease) 06/12/2016  . Fibromyalgia 06/12/2016  . Peripheral neuropathy 06/12/2016  . Atrial fibrillation ([place]) 06/12/2016  . S/P gastric bypass 01/22/2016  . S/P splenectomy 01/22/2016  . S/P cholecystectomy 01/22/2016  . Hx of small bowel obstruction 01/22/2016  . Asthma 10/19/2015  .  Generalized anxiety disorder 10/19/2015  . Depression 10/19/2015  . Insomnia 10/19/2015  . Iron deficiency anemia 10/19/2015  . Vitamin D deficiency 11/29/2014  . Eczema 11/27/2014  . Benign essential HTN 09/25/2014  . Morbid obesity ([place]) 09/25/2014  . Chronic diastolic heart failure ([place]) 09/25/2014  . SOB (shortness of breath) 03/27/2014  . Allergic rhinitis 02/28/2013    Past Surgical History:  Procedure Laterality Date  . ABDOMINAL HYSTERECTOMY    . APPENDECTOMY    . broken hip     left; metal  . CHOLECYSTECTOMY  1978  . GASTRIC BYPASS  2002   [place]  . LAPAROSCOPY  08/22/2016   Procedure: LAPAROSCOPY DIAGNOSTIC;  Surgeon: [name], MD;  Location: [facility];  Service: General;;  . SPLENECTOMY  2002  . TONSILLECTOMY  1963    OB History    No data available       Home Medications    Prior to Admission medications   Medication Sig Start Date End Date Taking? Authorizing Provider  atorvastatin (LIPITOR) 20 MG tablet Take 20 mg by mouth daily.    [provider]  cyclobenzaprine (FLEXERIL) 5 MG tablet Take 1 tablet (5 mg total) by mouth 3 (three) times daily as needed for muscle spasms. 05/19/17   Hassell Done, Mary-Margaret, FNP  diltiazem (CARDIZEM CD) 240 MG 24 hr capsule Take 1 capsule (240 mg total) by  mouth daily. 07/21/17   Herminio Commons, MD  DULoxetine (CYMBALTA) 60 MG capsule TAKE 1 CAPSULE (60 MG TOTAL) BY MOUTH DAILY. 03/30/17   Hawks, Christy A, FNP  ELIQUIS 5 MG TABS tablet TAKE 1 TABLET (5 MG TOTAL) BY MOUTH 2 (TWO) TIMES DAILY. 09/12/16   Nahser, Wonda Cheng, MD  ferrous sulfate 325 (65 FE) MG tablet Take 325 mg by mouth 3 (three) times a week.    [provider]  fluticasone (FLONASE) 50 MCG/ACT nasal spray Place 2 sprays into both nostrils daily. 11/20/14   Collene Gobble, MD  gabapentin (NEURONTIN) 100 MG capsule Take 1 capsule (100 mg total) by mouth 2 (two) times daily. 05/19/17   Chevis Pretty, FNP     Family History Family History  Problem Relation Age of Onset  . Cancer Mother   . Alzheimer's disease Mother   . Arthritis Father   . Colon cancer Paternal Uncle 52    Social History Social History   Tobacco Use  . Smoking status: Former Smoker    Last attempt to quit: 02/26/1993    Years since quitting: 24.4  . Smokeless tobacco: Never Used  Substance Use Topics  . Alcohol use: No    Alcohol/week: 0.0 oz  . Drug use: No     Allergies   Codeine   Review of Systems Review of Systems  Constitutional: Positive for diaphoresis. Negative for chills and fever.  Respiratory: Positive for shortness of breath. Negative for cough.   Cardiovascular: Positive for chest pain. Negative for palpitations and leg swelling.  Gastrointestinal: Negative for abdominal pain, diarrhea, nausea and vomiting.  Musculoskeletal: Negative for back pain, myalgias, neck pain and neck stiffness.  Skin: Negative for rash and wound.  Neurological: Positive for dizziness, weakness and light-headedness. Negative for syncope and headaches.  Psychiatric/Behavioral: Positive for confusion.  All other systems reviewed and are negative.    Physical Exam Updated Vital Signs BP 109/88   Pulse (!) 115   Temp 97.6 F (36.4 C) (Oral)   Resp 11   SpO2 96%   Physical Exam  Constitutional: She is oriented to person, place, and time. She appears well-developed and well-nourished.  Non-toxic appearance. She does not appear ill.  HENT:  Head: Normocephalic and atraumatic.  Mouth/Throat: Oropharynx is clear and moist.  Eyes: EOM are normal. Pupils are equal, round, and reactive to light.  Neck: Normal range of motion. Neck supple.  Cardiovascular: An irregularly irregular rhythm present. Tachycardia present.  Pulmonary/Chest: Effort normal and breath sounds normal.  Few crackles in bilateral bases.  Patient has reproducible right parasternal tenderness to palpation.  No crepitance or deformity.   Abdominal: Soft. Bowel sounds are normal. There is no tenderness. There is no rebound and no guarding.  Musculoskeletal: Normal range of motion. She exhibits no edema or tenderness.  No lower extremity swelling, asymmetry or tenderness.  Neurological: She is alert and oriented to person, place, and time.  Moves all extremities without focal deficit.  Sensation intact.  Skin: Skin is warm and dry. No rash noted. No erythema.  Psychiatric: Her behavior is normal. Her mood appears anxious.  Nursing note and vitals reviewed.    ED Treatments / Results  Labs (all labs ordered are listed, but only abnormal results are displayed) Labs Reviewed  BASIC METABOLIC PANEL - Abnormal; Notable for the following components:      Result Value   Sodium 134 (*)    Chloride 99 (*)    Glucose, Bld 109 (*)  BUN 25 (*)    All other components within normal limits  CBC - Abnormal; Notable for the following components:   WBC 10.8 (*)    RBC 5.18 (*)    Hemoglobin 15.8 (*)    HCT 47.3 (*)    All other components within normal limits  URINALYSIS, ROUTINE W REFLEX MICROSCOPIC  MAGNESIUM  TSH  I-STAT TROPONIN, ED    EKG  EKG Interpretation  Date/Time:  Friday August 21 2017 20:22:45 EST Ventricular Rate:  125 PR Interval:    QRS Duration: 68 QT Interval:  330 QTC Calculation: 476 R Axis:   -35 Text Interpretation:  Atrial fibrillation with rapid ventricular response Left axis deviation Low voltage QRS Septal infarct , age undetermined Abnormal ECG Confirmed by Julianne Rice [phone]) on 08/21/2017 9:06:55 PM       Radiology Dg Chest 2 View  Result Date: 08/21/2017 CLINICAL DATA:  Mid chest pain and shortness of breath today. Ex-smoker. EXAM: CHEST  2 VIEW COMPARISON:  06/12/2016. FINDINGS: Normal sized heart. Clear lungs. Mild diffuse peribronchial thickening without significant change. Minimal linear scarring at both lung bases with improvement. Mild thoracic spine degenerative  changes. Left upper quadrant abdominal surgical clips and surgical staples and region of the gastroesophageal junction. IMPRESSION: No acute abnormality.  Stable mild chronic bronchitic changes. Electronically Signed   By: Claudie Revering M.D.   On: 08/21/2017 16:34    Procedures Procedures (including critical care time)  Medications Ordered in ED Medications  diltiazem (CARDIZEM) 100 mg in dextrose 5 % 100 mL (1 mg/mL) infusion (5 mg/hr Intravenous New Bag/Given 08/21/17 2202)  sodium chloride 0.9 % bolus 500 mL (0 mLs Intravenous Stopped 08/21/17 2203)   CRITICAL CARE Performed by: Julianne Rice Total critical care time: 25 minutes Critical care time was exclusive of separately billable procedures and treating other patients. Critical care was necessary to treat or prevent imminent or life-threatening deterioration. Critical care was time spent personally by me on the following activities: development of treatment plan with patient and/or surrogate as well as nursing, discussions with consultants, evaluation of patient's response to treatment, examination of patient, obtaining history from patient or surrogate, ordering and performing treatments and interventions, ordering and review of laboratory studies, ordering and review of radiographic studies, pulse oximetry and re-evaluation of patient's condition.  Initial Impression / Assessment and Plan / ED Course  I have reviewed the triage vital signs and the nursing notes.  Pertinent labs & imaging results that were available during my care of the patient were reviewed by me and considered in my medical decision making (see chart for details).     Will discuss with cardiology.   Discussed with cardiology.  Agree with plan for low-dose Cardizem drip and small IV fluid bolus.  Will see patient in the emergency department and admit.  Final Clinical Impressions(s) / ED Diagnoses   Final diagnoses:  Atrial fibrillation with RVR ([place])  Near  syncope    ED Discharge Orders    None       Julianne Rice, MD 08/21/17 2216

## 2017-08-22 ENCOUNTER — Other Ambulatory Visit: Payer: Self-pay

## 2017-08-22 DIAGNOSIS — I482 Chronic atrial fibrillation: Secondary | ICD-10-CM

## 2017-08-22 LAB — BASIC METABOLIC PANEL
ANION GAP: 9 (ref 5–15)
BUN: 23 mg/dL — AB (ref 6–20)
CALCIUM: 8.5 mg/dL — AB (ref 8.9–10.3)
CO2: 24 mmol/L (ref 22–32)
Chloride: 102 mmol/L (ref 101–111)
Creatinine, Ser: 0.81 mg/dL (ref 0.44–1.00)
GFR calc Af Amer: >60 mL/min (ref >60)
GFR calc non Af Amer: >60 mL/min (ref >60)
GLUCOSE: 98 mg/dL (ref 65–99)
Potassium: 3.1 mmol/L — ABNORMAL LOW (ref 3.5–5.1)
Sodium: 135 mmol/L (ref 135–145)

## 2017-08-22 LAB — URINALYSIS, ROUTINE W REFLEX MICROSCOPIC
Bilirubin Urine: NEGATIVE
GLUCOSE, UA: 50 mg/dL — AB
Hgb urine dipstick: NEGATIVE
KETONES UR: NEGATIVE mg/dL
Nitrite: NEGATIVE
PROTEIN: NEGATIVE mg/dL
Specific Gravity, Urine: 1.018 (ref 1.005–1.030)
pH: 5 (ref 5.0–8.0)

## 2017-08-22 LAB — CBC
HEMATOCRIT: 43 % (ref 36.0–46.0)
HEMOGLOBIN: 13.9 g/dL (ref 12.0–15.0)
MCH: 29.6 pg (ref 26.0–34.0)
MCHC: 32.3 g/dL (ref 30.0–36.0)
MCV: 91.5 fL (ref 78.0–100.0)
Platelets: 332 10*3/uL (ref 150–400)
RBC: 4.7 MIL/uL (ref 3.87–5.11)
RDW: 13.8 % (ref 11.5–15.5)
WBC: 8.6 10*3/uL (ref 4.0–10.5)

## 2017-08-22 LAB — MAGNESIUM: MAGNESIUM: 2 mg/dL (ref 1.7–2.4)

## 2017-08-22 LAB — TSH: TSH: 5.181 u[IU]/mL — ABNORMAL HIGH (ref 0.350–4.500)

## 2017-08-22 LAB — MRSA PCR SCREENING: MRSA by PCR: NEGATIVE

## 2017-08-22 MED ORDER — POTASSIUM CHLORIDE 20 MEQ/15ML (10%) PO SOLN
40.0000 meq | Freq: Once | ORAL | Status: AC
  Administered 2017-08-22: 40 meq via ORAL
  Filled 2017-08-22: qty 30

## 2017-08-22 MED ORDER — VERAPAMIL HCL 40 MG PO TABS
40.0000 mg | ORAL_TABLET | Freq: Three times a day (TID) | ORAL | Status: DC
  Administered 2017-08-22 – 2017-08-24 (×5): 40 mg via ORAL
  Filled 2017-08-22 (×7): qty 1

## 2017-08-22 MED ORDER — METOPROLOL TARTRATE 25 MG PO TABS
25.0000 mg | ORAL_TABLET | Freq: Three times a day (TID) | ORAL | Status: DC
  Administered 2017-08-22 – 2017-08-24 (×4): 25 mg via ORAL
  Filled 2017-08-22 (×4): qty 1

## 2017-08-22 NOTE — Consult Note (Signed)
Reason for Consult:atrial fib with a RVR  Referring Physician: Dr. [name] is an 71 y.o. female.   HPI: The patient is a 71 yo woman who I have seen in the distant past for dyspnea who has developed atrial fib with a RVR which has been refractory to medical therapy. She has developed increasing fatigue and dyspnea in atrial fib. She has not had syncope but does note near syncope. She has a h/o obesity but has gradually lost weight. She has palpitations as well. Review of her old ECGs demonstrates that her QT has been prolonged and she also has underlying lung disease. She has been cardioverted in the past but will not maintain NSR. Her HR's have been over a 100. Her blood pressure a little on the soft side.  PMH: Past Medical History:  Diagnosis Date  . Allergy   . Anemia   . Ankle fracture, right    past hx. -"no surgery"  . Anxiety   . Arthritis   . Cataract   . Chronic diastolic heart failure ([place])   . Collagen vascular disease ([place])   . Depression   . Dysrhythmia    Tachycardia, Atrial Fib episode noted x1- 9'17.Dr. Acie Fredrickson note [phone] Epic.  . Fibromyalgia   . Fistula   . GERD (gastroesophageal reflux disease)   . Hyperlipidemia   . Hypertension   . Neuromuscular disorder (HCC)    feet, legs, hands- tx. Gabapentin  . Neuropathy    legs and feet  . Tubular adenoma of colon   . UTI (urinary tract infection)    frequent UTI- tx. recently.    PSHX: Past Surgical History:  Procedure Laterality Date  . ABDOMINAL HYSTERECTOMY    . APPENDECTOMY    . broken hip     left; metal  . CHOLECYSTECTOMY  1978  . GASTRIC BYPASS  2002   [place]  . LAPAROSCOPY  08/22/2016   Procedure: LAPAROSCOPY DIAGNOSTIC;  Surgeon: [name], MD;  Location: [facility];  Service: General;;  . SPLENECTOMY  2002  . TONSILLECTOMY  1963    FAMHX: Family History  Problem Relation Age of Onset  . Cancer Mother   . Alzheimer's disease Mother   . Arthritis  Father   . Colon cancer Paternal Uncle 50    Social History:  reports that she quit smoking about 24 years ago. she has never used smokeless tobacco. She reports that she does not drink alcohol or use drugs.  Allergies:  Allergies  Allergen Reactions  . Codeine Nausea And Vomiting    Stomach pain also    Medications: I have reviewed the patient's current medications.  Dg Chest 2 View  Result Date: 08/21/2017 CLINICAL DATA:  Mid chest pain and shortness of breath today. Ex-smoker. EXAM: CHEST  2 VIEW COMPARISON:  06/12/2016. FINDINGS: Normal sized heart. Clear lungs. Mild diffuse peribronchial thickening without significant change. Minimal linear scarring at both lung bases with improvement. Mild thoracic spine degenerative changes. Left upper quadrant abdominal surgical clips and surgical staples and region of the gastroesophageal junction. IMPRESSION: No acute abnormality.  Stable mild chronic bronchitic changes. Electronically Signed   By: Claudie Revering M.D.   On: 08/21/2017 16:34    ROS  As stated in the HPI and negative for all other systems.  Physical Exam  Vitals:Blood pressure 127/90, pulse (!) 109, temperature 97.8 F (36.6 C), temperature source Oral, resp. rate 20, SpO2 99 %.  Well appearing 71 yo man, NAD  HEENT: Unremarkable Neck:  6 cm JVD, no thyromegally Lymphatics:  No adenopathy Back:  No CVA tenderness Lungs:  Clear with no wheezes HEART:  IRegular tachy rhythm, no murmurs, no rubs, no clicks Abd:  soft, positive bowel sounds, no organomegally, no rebound, no guarding Ext:  2 plus pulses, no edema, no cyanosis, no clubbing Skin:  No rashes no nodules Neuro:  CN II through XII intact, motor grossly intact  ECG - atrial fib with a RVR  CXR - reviewed  2D echo - reviewed  Assessment/Plan: 1. Atrial fib with a RVR - she will be admitted to the hospital and we will start verapamil and a beta blocker in an attempt to control her HR.  2. Acute on chronic  diastolic heart failure - her symptoms are class 3B when her atrial fib is fast. Will hold off on lasix for now but order if needed 3. QT - her QTC is a bit too long to try dofetilide 4. COPD - she does not smoke cigarettes having stopped over 20 years ago.  Sabrina [name] TaylorMD 08/22/2017, 3:33 PM

## 2017-08-22 NOTE — ED Notes (Signed)
MD Wynonia Lawman at bedside.

## 2017-08-22 NOTE — Progress Notes (Addendum)
Subjective:  She was sent down with symptomatic rapid atrial fibrillation.  He has marked atrial enlargement and her cardiologist wanted her seen by EP.  Feeling better today with less dizziness shortness of breath and overall feels better.  Objective:  Vital Signs in the last 24 hours: BP 97/68   Pulse 97   Temp 97.8 F (36.6 C) (Oral)   Resp 17   SpO2 96%   Physical Exam: Pleasant elderly female in no acute distress Lungs:  Clear Cardiac:  Rapid irregular rhythm, normal S1 and S2, no S3 Extremities:  No edema present  Intake/Output from previous day: 11/30 0701 - 12/01 0700 In: 500 [IV Piggyback:500] Out: -   Weight There were no vitals filed for this visit.  Lab Results: Basic Metabolic Panel: Recent Labs    08/21/17 1549 08/22/17 0332  NA 134* 135  K 3.8 3.1*  CL 99* 102  CO2 24 24  GLUCOSE 109* 98  BUN 25* 23*  CREATININE 0.89 0.81   CBC: Recent Labs    08/21/17 1549 08/22/17 0332  WBC 10.8* 8.6  HGB 15.8* 13.9  HCT 47.3* 43.0  MCV 91.3 91.5  PLT 389 332   Cardiac Enzymes: Troponin (Point of Care Test) Recent Labs    08/21/17 1613  TROPIPOC 0.00   Telemetry: Atrial fibrillation with rapid response   Assessment/Plan:  1.  Persistent atrial fibrillation with rapid response with marked atrial enlargement 2.  Chronic diastolic heart failure 3.  Hypertensive heart disease 4.  Hypokalemia  Recommendations:  Continue anticoagulation.  May add digoxin to help with rate control  once potassium repleted  I will ask EP to see.     Kerry Hough  MD [facility]  08/22/2017, 2:03 PM

## 2017-08-22 NOTE — Progress Notes (Signed)
Cards Provider on call paged with SYS BP <90 and a need to clarify cardizem gtt orders. Will keep cardizem gtt off tonight with current hr in 55s. Provider on call states that SYS BP<90 okay as long as MAP maintains >=65.

## 2017-08-22 NOTE — ED Notes (Signed)
Pt stated she woke up short of breath

## 2017-08-22 NOTE — ED Notes (Signed)
Pt given Breakfast Tray.

## 2017-08-22 NOTE — ED Notes (Signed)
Pt on hospital bed, given pillow.

## 2017-08-23 LAB — BASIC METABOLIC PANEL
ANION GAP: 8 (ref 5–15)
BUN: 21 mg/dL — ABNORMAL HIGH (ref 6–20)
CHLORIDE: 107 mmol/L (ref 101–111)
CO2: 23 mmol/L (ref 22–32)
Calcium: 8.8 mg/dL — ABNORMAL LOW (ref 8.9–10.3)
Creatinine, Ser: 0.8 mg/dL (ref 0.44–1.00)
GFR calc Af Amer: >60 mL/min (ref >60)
GLUCOSE: 95 mg/dL (ref 65–99)
POTASSIUM: 3.7 mmol/L (ref 3.5–5.1)
Sodium: 138 mmol/L (ref 135–145)

## 2017-08-23 NOTE — Evaluation (Signed)
Physical Therapy Evaluation Patient Details Name: Sabrina [name] MRN: [id number] DOB: 03/12/1946 Today's Date: 08/23/2017   History of Present Illness  71 y.o. female admitted with uncontrolled atrial fib  Clinical Impression  Orders received for PT evaluation. Patient demonstrates very modest deficits in functional mobility but overall mobilizing well. Patient does show decreased activity tolerance as evidenced by increased DOE 3/4 and elevated HR afib 140s with mobility. Patient interested in pursuing cardiac rehabilitation and I agree with this request. Feel patient would benefit from outpatient cardiac rehabilitation to improve activity tolerance. No further acute PT needs. Will sign off.    Follow Up Recommendations Supervision - Intermittent(oupatient therapy (Cardiac rehab))    Equipment Recommendations  None recommended by PT    Recommendations for Other Services       Precautions / Restrictions Restrictions Weight Bearing Restrictions: No      Mobility  Bed Mobility Overal bed mobility: Modified Independent             General bed mobility comments: increased time and effort to perform  Transfers Overall transfer level: Modified independent               General transfer comment: use of rail when elevating upright, no physical assist required  Ambulation/Gait Ambulation/Gait assistance: Independent Ambulation Distance (Feet): 340 Feet Assistive device: None Gait Pattern/deviations: Step-through pattern;Decreased stride length;Drifts right/left(increased DOE and elevated HR with activity 140s) Gait velocity: decreased   General Gait Details: steady with ambulation, no overt LOB noted  Stairs            Wheelchair Mobility    Modified Rankin (Stroke Patients Only)       Balance Overall balance assessment: No apparent balance deficits (not formally assessed)                                           Pertinent  Vitals/Pain Pain Assessment: No/denies pain    Home Living Family/patient expects to be discharged to:: Private residence Living Arrangements: Children(grandson) Available Help at Discharge: Family;Available PRN/intermittently Type of Home: House Home Access: Stairs to enter Entrance Stairs-Rails: Can reach both Entrance Stairs-Number of Steps: 2 Home Layout: Able to live on main level with bedroom/bathroom Home Equipment: Walker - 2 wheels;Cane - single point      Prior Function Level of Independence: Independent               Hand Dominance   Dominant Hand: Right    Extremity/Trunk Assessment   Upper Extremity Assessment Upper Extremity Assessment: Overall WFL for tasks assessed    Lower Extremity Assessment Lower Extremity Assessment: Overall WFL for tasks assessed       Communication   Communication: No difficulties  Cognition Arousal/Alertness: Awake/alert Behavior During Therapy: WFL for tasks assessed/performed Overall Cognitive Status: Within Functional Limits for tasks assessed                                        General Comments      Exercises     Assessment/Plan    PT Assessment All further PT needs can be met in the next venue of care  PT Problem List Decreased activity tolerance;Cardiopulmonary status limiting activity       PT Treatment Interventions      PT Goals (Current  goals can be found in the Care Plan section)  Acute Rehab PT Goals Patient Stated Goal: to go home PT Goal Formulation: All assessment and education complete, DC therapy    Frequency     Barriers to discharge        Co-evaluation               AM-PAC PT "6 Clicks" Daily Activity  Outcome Measure Difficulty turning over in bed (including adjusting bedclothes, sheets and blankets)?: None Difficulty moving from lying on back to sitting on the side of the bed? : A Little Difficulty sitting down on and standing up from a chair with arms  (e.g., wheelchair, bedside commode, etc,.)?: A Little Help needed moving to and from a bed to chair (including a wheelchair)?: None Help needed walking in hospital room?: None Help needed climbing 3-5 steps with a railing? : A Little 6 Click Score: 21    End of Session Equipment Utilized During Treatment: Gait belt Activity Tolerance: Patient tolerated treatment well Patient left: in chair;with call bell/phone within reach Nurse Communication: Mobility status PT Visit Diagnosis: Difficulty in walking, not elsewhere classified (R26.2)    Time: 4193-7902 PT Time Calculation (min) (ACUTE ONLY): 21 min   Charges:   PT Evaluation $PT Eval Moderate Complexity: 1 Mod     PT G Codes:        Alben Deeds, PT DPT  Board Certified Neurologic Specialist [phone]   Duncan Dull 08/23/2017, 12:21 PM

## 2017-08-23 NOTE — Plan of Care (Signed)
Pt walked around the unit 3 times today.

## 2017-08-23 NOTE — Discharge Instructions (Addendum)
You are scheduled for your pacemaker implant and ablation procedure with Dr. Lovena Le on Thursday 08/27/17 @ 1:00PM You need to arrive to [facility] no later then 11:00AM  Enter at the main entrance [facility]) and check in at the admissions desk Do not eat or drink anything after midnight the evening prior It is OK to take your usual morning medicines with sips of water only, DO NOT TAKE your ELIQUIS the day of your procedure Expect to stay overnight    Information on my medicine - ELIQUIS (apixaban)   Why was Eliquis prescribed for you? Eliquis was prescribed for you to reduce the risk of forming blood clots that can cause a stroke if you have a medical condition called atrial fibrillation (a type of irregular heartbeat) OR to reduce the risk of a blood clots forming after orthopedic surgery.  What do You need to know about Eliquis ? Take your Eliquis TWICE DAILY - one tablet in the morning and one tablet in the evening with or without food.  It would be best to take the doses about the same time each day.  If you have difficulty swallowing the tablet whole please discuss with your pharmacist how to take the medication safely.  Take Eliquis exactly as prescribed by your doctor and DO NOT stop taking Eliquis without talking to the doctor who prescribed the medication.  Stopping may increase your risk of developing a new clot or stroke.  Refill your prescription before you run out.  After discharge, you should have regular check-up appointments with your healthcare provider that is prescribing your Eliquis.  In the future your dose may need to be changed if your kidney function or weight changes by a significant amount or as you get older.  What do you do if you miss a dose? If you miss a dose, take it as soon as you remember on the same day and resume taking twice daily.  Do not take more than one dose of ELIQUIS at the same time.  Important Safety Information A possible  side effect of Eliquis is bleeding. You should call your healthcare provider right away if you experience any of the following: ? Bleeding from an injury or your nose that does not stop. ? Unusual colored urine (red or dark brown) or unusual colored stools (red or black). ? Unusual bruising for unknown reasons. ? A serious fall or if you hit your head (even if there is no bleeding).  Some medicines may interact with Eliquis and might increase your risk of bleeding or clotting while on Eliquis. To help avoid this, consult your healthcare provider or pharmacist prior to using any new prescription or non-prescription medications, including herbals, vitamins, non-steroidal anti-inflammatory drugs (NSAIDs) and supplements.  This website has more information on Eliquis (apixaban): [email or web address].

## 2017-08-23 NOTE — H&P (View-Only) (Signed)
Progress Note  Patient Name: Sabrina [name] Date of Encounter: 08/23/2017  Primary Cardiologist: Jacinta Shoe  Subjective   Feels better. HR is under better control.  Inpatient Medications    Scheduled Meds: . apixaban  5 mg Oral BID  . atorvastatin  20 mg Oral Daily  . DULoxetine  60 mg Oral Daily  . gabapentin  100 mg Oral BID  . metoprolol tartrate  25 mg Oral TID  . verapamil  40 mg Oral Q8H   Continuous Infusions:  PRN Meds: acetaminophen, cyclobenzaprine, fluticasone, ondansetron (ZOFRAN) IV   Vital Signs    Vitals:   08/23/17 0900 08/23/17 1000 08/23/17 1100 08/23/17 1101  BP: 108/80 97/76 100/81   Pulse: 99 95 90   Resp: 20 19 20    Temp:    98.2 F (36.8 C)  TempSrc:    Oral  SpO2: 97% 94% 95%   Weight:      Height:        Intake/Output Summary (Last 24 hours) at 08/23/2017 1149 Last data filed at 08/23/2017 0500 Gross per 24 hour  Intake 165 ml  Output -  Net 165 ml   Filed Weights   08/22/17 1651 08/23/17 0500  Weight: 186 lb 1.1 oz (84.4 kg) 187 lb 2.7 oz (84.9 kg)    Telemetry    Atrial fib with a CVR/RVR - Personally Reviewed  ECG    none - Personally Reviewed  Physical Exam   GEN: No acute distress.   Neck: 6 cm JVD Cardiac: IRIRR, no murmurs, rubs, or gallops.  Respiratory: Clear to auscultation bilaterally. GI: Soft, nontender, non-distended  MS: No edema; No deformity. Neuro:  Nonfocal  Psych: Normal affect   Labs    Chemistry Recent Labs  Lab 08/21/17 1549 08/22/17 0332 08/23/17 0430  NA 134* 135 138  K 3.8 3.1* 3.7  CL 99* 102 107  CO2 24 24 23   GLUCOSE 109* 98 95  BUN 25* 23* 21*  CREATININE 0.89 0.81 0.80  CALCIUM 9.0 8.5* 8.8*  GFRNONAA >60 >60 >60  GFRAA >60 >60 >60  ANIONGAP 11 9 8      Hematology Recent Labs  Lab 08/21/17 1549 08/22/17 0332  WBC 10.8* 8.6  RBC 5.18* 4.70  HGB 15.8* 13.9  HCT 47.3* 43.0  MCV 91.3 91.5  MCH 30.5 29.6  MCHC 33.4 32.3  RDW 13.7 13.8  PLT 389 332     Cardiac EnzymesNo results for input(s): TROPONINI in the last 168 hours.  Recent Labs  Lab 08/21/17 1613  TROPIPOC 0.00     BNPNo results for input(s): BNP, PROBNP in the last 168 hours.   DDimer No results for input(s): DDIMER in the last 168 hours.   Radiology    Dg Chest 2 View  Result Date: 08/21/2017 CLINICAL DATA:  Mid chest pain and shortness of breath today. Ex-smoker. EXAM: CHEST  2 VIEW COMPARISON:  06/12/2016. FINDINGS: Normal sized heart. Clear lungs. Mild diffuse peribronchial thickening without significant change. Minimal linear scarring at both lung bases with improvement. Mild thoracic spine degenerative changes. Left upper quadrant abdominal surgical clips and surgical staples and region of the gastroesophageal junction. IMPRESSION: No acute abnormality.  Stable mild chronic bronchitic changes. Electronically Signed   By: Claudie Revering M.D.   On: 08/21/2017 16:34    Cardiac Studies     Patient Profile     71 y.o. female admitted with uncontrolled atrial fib  Assessment & Plan    1. Persistent atrial fib -  her rates are better now that she is on both verapamil and metoprolol. I will have her walk in the halls today and plan for DC home tomorrow. She will return next week for PPM and AV node ablation. 2. COPD - she is not wheezing today. Will follow. 3. Obesity - she has lost weight over the years. She is encouraged to lose more weight.   For questions or updates, please contact [place] Please consult [email or web address] for contact info under Cardiology/STEMI.   Signed, [name], MD  08/23/2017, 11:49 AM  Patient ID: Sabrina [name], female   DOB: 1946-06-19, 71 y.o.   MRN: [id number]

## 2017-08-23 NOTE — Progress Notes (Signed)
Progress Note  Patient Name: Sabrina [name] Date of Encounter: 08/23/2017  Primary Cardiologist: Sabrina [name]  Subjective   Feels better. HR is under better control.  Inpatient Medications    Scheduled Meds: . apixaban  5 mg Oral BID  . atorvastatin  20 mg Oral Daily  . DULoxetine  60 mg Oral Daily  . gabapentin  100 mg Oral BID  . metoprolol tartrate  25 mg Oral TID  . verapamil  40 mg Oral Q8H   Continuous Infusions:  PRN Meds: acetaminophen, cyclobenzaprine, fluticasone, ondansetron (ZOFRAN) IV   Vital Signs    Vitals:   08/23/17 0900 08/23/17 1000 08/23/17 1100 08/23/17 1101  BP: 108/80 97/76 100/81   Pulse: 99 95 90   Resp: 20 19 20    Temp:    98.2 F (36.8 C)  TempSrc:    Oral  SpO2: 97% 94% 95%   Weight:      Height:        Intake/Output Summary (Last 24 hours) at 08/23/2017 1149 Last data filed at 08/23/2017 0500 Gross per 24 hour  Intake 165 ml  Output -  Net 165 ml   Filed Weights   08/22/17 1651 08/23/17 0500  Weight: 186 lb 1.1 oz (84.4 kg) 187 lb 2.7 oz (84.9 kg)    Telemetry    Atrial fib with a CVR/RVR - Personally Reviewed  ECG    none - Personally Reviewed  Physical Exam   GEN: No acute distress.   Neck: 6 cm JVD Cardiac: IRIRR, no murmurs, rubs, or gallops.  Respiratory: Clear to auscultation bilaterally. GI: Soft, nontender, non-distended  MS: No edema; No deformity. Neuro:  Nonfocal  Psych: Normal affect   Labs    Chemistry Recent Labs  Lab 08/21/17 1549 08/22/17 0332 08/23/17 0430  NA 134* 135 138  K 3.8 3.1* 3.7  CL 99* 102 107  CO2 24 24 23   GLUCOSE 109* 98 95  BUN 25* 23* 21*  CREATININE 0.89 0.81 0.80  CALCIUM 9.0 8.5* 8.8*  GFRNONAA >60 >60 >60  GFRAA >60 >60 >60  ANIONGAP 11 9 8      Hematology Recent Labs  Lab 08/21/17 1549 08/22/17 0332  WBC 10.8* 8.6  RBC 5.18* 4.70  HGB 15.8* 13.9  HCT 47.3* 43.0  MCV 91.3 91.5  MCH 30.5 29.6  MCHC 33.4 32.3  RDW 13.7 13.8  PLT 389 332     Cardiac EnzymesNo results for input(s): TROPONINI in the last 168 hours.  Recent Labs  Lab 08/21/17 1613  TROPIPOC 0.00     BNPNo results for input(s): BNP, PROBNP in the last 168 hours.   DDimer No results for input(s): DDIMER in the last 168 hours.   Radiology    Dg Chest 2 View  Result Date: 08/21/2017 CLINICAL DATA:  Mid chest pain and shortness of breath today. Ex-smoker. EXAM: CHEST  2 VIEW COMPARISON:  06/12/2016. FINDINGS: Normal sized heart. Clear lungs. Mild diffuse peribronchial thickening without significant change. Minimal linear scarring at both lung bases with improvement. Mild thoracic spine degenerative changes. Left upper quadrant abdominal surgical clips and surgical staples and region of the gastroesophageal junction. IMPRESSION: No acute abnormality.  Stable mild chronic bronchitic changes. Electronically Signed   By: Claudie Revering M.D.   On: 08/21/2017 16:34    Cardiac Studies     Patient Profile     71 y.o. female admitted with uncontrolled atrial fib  Assessment & Plan    1. Persistent atrial fib -  her rates are better now that she is on both verapamil and metoprolol. I will have her walk in the halls today and plan for DC home tomorrow. She will return next week for PPM and AV node ablation. 2. COPD - she is not wheezing today. Will follow. 3. Obesity - she has lost weight over the years. She is encouraged to lose more weight.   For questions or updates, please contact Sabrina [name] Please consult [email or web address] for contact info under Cardiology/STEMI.   Signed, Sabrina Peru, MD  08/23/2017, 11:49 AM  Patient ID: Sabrina [name], female   DOB: [birth year], 71 y.o.   MRN: [id number]

## 2017-08-24 DIAGNOSIS — I481 Persistent atrial fibrillation: Principal | ICD-10-CM

## 2017-08-24 MED ORDER — METOPROLOL TARTRATE 25 MG PO TABS: 25.0000 mg | ORAL_TABLET | Freq: Three times a day (TID) | ORAL | 3 refills | Status: DC

## 2017-08-24 MED ORDER — VERAPAMIL HCL 40 MG PO TABS: 40.0000 mg | ORAL_TABLET | Freq: Three times a day (TID) | ORAL | 3 refills | Status: DC

## 2017-08-24 NOTE — Progress Notes (Signed)
Nutrition Brief Note  Patient identified on the Malnutrition Screening Tool (MST) Report  Wt Readings from Last 15 Encounters:  08/24/17 185 lb 13.6 oz (84.3 kg)  08/21/17 183 lb (83 kg)  07/21/17 184 lb (83.5 kg)  07/14/17 183 lb 12.8 oz (83.4 kg)  05/19/17 184 lb 12.8 oz (83.8 kg)  02/23/17 188 lb 9.6 oz (85.5 kg)  02/05/17 188 lb (85.3 kg)  09/26/16 183 lb 2 oz (83.1 kg)  09/25/16 184 lb 6.4 oz (83.6 kg)  08/22/16 193 lb (87.5 kg)  08/18/16 193 lb 8 oz (87.8 kg)  06/23/16 182 lb 9.6 oz (82.8 kg)  06/17/16 187 lb (84.8 kg)  06/12/16 186 lb (84.4 kg)  04/21/16 185 lb 3.2 oz (84 kg)    Body mass index is 31.9 kg/m. Patient meets criteria for obesity based on current BMI. Weight stable x1 year. Skin WDL. Pt with hx of afib, CHF, depression/anxiety, HTN, HLD, fibromyalgia. She was admitted on 11/30 for progressive exertional intolerance and palpitations. Discharge order and summary are in this AM.   Current diet order is 2 gram Na, patient consumed 75% of all meals yesterday.  Labs and medications reviewed.   No nutrition interventions warranted at this time. If nutrition issues arise, please consult RD.     Jarome Matin, MS, RD, LDN, [facility] Inpatient Clinical Dietitian Pager # [phone] After hours/weekend pager # [phone]

## 2017-08-24 NOTE — Progress Notes (Signed)
Discharge instructions given to patient. All questions answered prior to discharge.

## 2017-08-24 NOTE — Plan of Care (Signed)
Pt ready to be discharged today

## 2017-08-24 NOTE — Discharge Summary (Signed)
ELECTROPHYSIOLOGY PROCEDURE DISCHARGE SUMMARY    Patient ID: Sabrina [name],  MRN: [id number], DOB/AGE: Feb 25, 1946 71 y.o.  Admit date: 08/21/2017 Discharge date: 08/24/2017  Primary Care Physician: Sabrina Balloon, FNP  Primary Cardiologist: Dr. Jacinta Shoe Electrophysiologist: Dr. Lovena Le  Primary Discharge Diagnosis:  1. AFib RVR     CHA2DS2Vasc is 3, on Eliquis, appropriately dosed  Secondary Discharge Diagnosis:  1. HTN   Allergies  Allergen Reactions  . Codeine Nausea And Vomiting    Stomach pain also     Procedures This Admission:  None  Brief HPI: Sabrina [name] is a 71 y.o. female with history of atrial fibrillation, severely dilated LA, chronic diastolic CHF,  depression/anxiety, HTN, HLD, fibromyalgia, admitted to [facility] 08/21/17 with progressive exertional intolerance/DOE, palpitations, noted in ER to have AFib with RVR and started on dilt gtt, and given IVF.   Hospital Course:  The patient was admitted and rate controlled with diltiazem gtt, EP was consulted to her case.  Her baseline QT felt to be too long for Tikosyn, and having hx of COPD felt not to have any antiarrhythmic drug options.  Ultimately started on metoprolol and verapamil to get some rate control with PO meds, though long term, planned for PPM and AV node ablation.  The patient walked several times yesterday feeling well, initially felt uneasy worried she was going to feel SOB or dizzy but ultimately did not and feels like she is  Ready for home today.  HR generally appear 80's-90's, intermittently 120's.  PT noted HR 140 in their note with ambulation, though the patient reported and RN notes that she walked several laps around the unit and the patient reported did well, with no perception of racing heart rates.  BP was on he lower side yesterday AM, though since has maintained high 90's-100s/60's, and feeling well.  She has ambulated again this morning without difficulty or need of assistance.   She is scheduled for PPMimplant/AV node ablation for Thursday, the patient was provided instructions for this.  The patient was examined by Dr. Lovena Le this morning and considered stable for discharge to home.    Physical Exam: Vitals:   08/24/17 0500 08/24/17 0600 08/24/17 0608 08/24/17 0700  BP: 111/76 96/74 96/74  101/78  Pulse: 77 72  (!) 126  Resp:      Temp:      TempSrc:      SpO2: 97% 96%  94%  Weight: 185 lb 13.6 oz (84.3 kg)     Height:        GEN- The patient is well appearing, alert and oriented x 3 today.   HEENT: normocephalic, atraumatic; sclera clear, conjunctiva pink; hearing intact; oropharynx clear; neck supple, no JVP Lungs- CTA b/l, normal work of breathing.  No wheezes, rales, rhonchi Heart- iRRR, soft SM, no rubs or gallops, PMI not laterally displaced GI- soft, non-tender, non-distended, bowel sounds present, no hepatosplenomegaly Extremities- no clubbing, cyanosis, or edema MS- no significant deformity or atrophy Skin- warm and dry, no rash or lesion Psych- euthymic mood, full affect Neuro- no gross deficits   Labs:   Lab Results  Component Value Date   WBC 8.6 08/22/2017   HGB 13.9 08/22/2017   HCT 43.0 08/22/2017   MCV 91.5 08/22/2017   PLT 332 08/22/2017    Recent Labs  Lab 08/23/17 0430  NA 138  K 3.7  CL 107  CO2 23  BUN 21*  CREATININE 0.80  CALCIUM 8.8*  GLUCOSE 95  Discharge Medications:  Allergies as of 08/24/2017      Reactions   Codeine Nausea And Vomiting   Stomach pain also      Medication List    STOP taking these medications   diltiazem 240 MG 24 hr capsule Commonly known as:  CARDIZEM CD     TAKE these medications   atorvastatin 20 MG tablet Commonly known as:  LIPITOR Take 20 mg by mouth daily.   B-12 PO Take 1 tablet by mouth daily.   budesonide-formoterol 160-4.5 MCG/ACT inhaler Commonly known as:  SYMBICORT Inhale 2 puffs into the lungs 2 (two) times daily as needed (for asthma flares).     cyclobenzaprine 5 MG tablet Commonly known as:  FLEXERIL Take 1 tablet (5 mg total) by mouth 3 (three) times daily as needed for muscle spasms.   DULoxetine 60 MG capsule Commonly known as:  CYMBALTA TAKE 1 CAPSULE (60 MG TOTAL) BY MOUTH DAILY.   ELIQUIS 5 MG Tabs tablet Generic drug:  apixaban TAKE 1 TABLET (5 MG TOTAL) BY MOUTH 2 (TWO) TIMES DAILY.   FISH OIL PO Take 1 capsule by mouth daily.   fluticasone 50 MCG/ACT nasal spray Commonly known as:  FLONASE Place 2 sprays into both nostrils daily. What changed:    when to take this  reasons to take this   gabapentin 100 MG capsule Commonly known as:  NEURONTIN Take 1 capsule (100 mg total) by mouth 2 (two) times daily.   metoprolol tartrate 25 MG tablet Commonly known as:  LOPRESSOR Take 1 tablet (25 mg total) by mouth 3 (three) times daily.   ONE-A-DAY 50 PLUS PO Take 1 tablet by mouth 2 (two) times daily.   verapamil 40 MG tablet Commonly known as:  CALAN Take 1 tablet (40 mg total) by mouth every 8 (eight) hours.   Vitamin D (Ergocalciferol) 50000 units Caps capsule Commonly known as:  DRISDOL TAKE 1 CAPSULE (50,000 UNITS TOTAL) BY MOUTH EVERY 7 (SEVEN) DAYS.   VITAMIN D-3 PO Take 1 capsule by mouth daily.       Disposition:  Home    Duration of Discharge Encounter: Greater than 30 minutes including physician time.  Venetia Night, PA-C 08/24/2017 9:09 AM  EP attending  Patient seen and examined.  Agree with the findings as noted above.  She is doing well this morning.  Her ventricular rate is well controlled in atrial fibrillation and her symptoms are markedly improved.  We discussed the various treatment options in detail.  She would like to proceed with permanent pacemaker insertion and AV node ablation.  She will return in several days for this.  Crissie Sickles, MD

## 2017-08-27 ENCOUNTER — Encounter (HOSPITAL_COMMUNITY)
Admission: RE | Disposition: A | Discharge status: Home / Self Care | Payer: Self-pay | Source: Ambulatory Visit | Attending: Internal Medicine

## 2017-08-27 ENCOUNTER — Ambulatory Visit (HOSPITAL_COMMUNITY)
Admission: RE | Admit: 2017-08-27 | Discharge: 2017-08-28 | Disposition: A | Discharge status: Home / Self Care | Payer: Medicare HMO | Source: Ambulatory Visit | Attending: Internal Medicine | Admitting: Internal Medicine

## 2017-08-27 ENCOUNTER — Other Ambulatory Visit: Payer: Self-pay

## 2017-08-27 ENCOUNTER — Encounter (HOSPITAL_COMMUNITY): Payer: Self-pay | Admitting: General Practice

## 2017-08-27 DIAGNOSIS — I482 Chronic atrial fibrillation: Secondary | ICD-10-CM

## 2017-08-27 DIAGNOSIS — Z885 Allergy status to narcotic agent status: Secondary | ICD-10-CM | POA: Diagnosis not present

## 2017-08-27 DIAGNOSIS — E669 Obesity, unspecified: Secondary | ICD-10-CM | POA: Insufficient documentation

## 2017-08-27 DIAGNOSIS — Z7901 Long term (current) use of anticoagulants: Secondary | ICD-10-CM | POA: Insufficient documentation

## 2017-08-27 DIAGNOSIS — F419 Anxiety disorder, unspecified: Secondary | ICD-10-CM | POA: Diagnosis not present

## 2017-08-27 DIAGNOSIS — Z950 Presence of cardiac pacemaker: Secondary | ICD-10-CM

## 2017-08-27 DIAGNOSIS — Z6832 Body mass index (BMI) 32.0-32.9, adult: Secondary | ICD-10-CM | POA: Diagnosis not present

## 2017-08-27 DIAGNOSIS — J449 Chronic obstructive pulmonary disease, unspecified: Secondary | ICD-10-CM | POA: Diagnosis not present

## 2017-08-27 DIAGNOSIS — I10 Essential (primary) hypertension: Secondary | ICD-10-CM | POA: Diagnosis not present

## 2017-08-27 DIAGNOSIS — Z79899 Other long term (current) drug therapy: Secondary | ICD-10-CM | POA: Insufficient documentation

## 2017-08-27 DIAGNOSIS — I4891 Unspecified atrial fibrillation: Secondary | ICD-10-CM | POA: Diagnosis present

## 2017-08-27 DIAGNOSIS — I481 Persistent atrial fibrillation: Secondary | ICD-10-CM | POA: Diagnosis not present

## 2017-08-27 HISTORY — DX: Unspecified cataract: H26.9

## 2017-08-27 HISTORY — DX: Low back pain: M54.5

## 2017-08-27 HISTORY — DX: Heart failure, unspecified: I50.9

## 2017-08-27 HISTORY — DX: Chronic obstructive pulmonary disease, unspecified: J44.9

## 2017-08-27 HISTORY — DX: Unspecified osteoarthritis, unspecified site: M19.90

## 2017-08-27 HISTORY — DX: Polyneuropathy, unspecified: G62.9

## 2017-08-27 HISTORY — DX: Low back pain, unspecified: M54.50

## 2017-08-27 HISTORY — DX: Unspecified asthma, uncomplicated: J45.909

## 2017-08-27 HISTORY — DX: Acute myocardial infarction, unspecified: I21.9

## 2017-08-27 HISTORY — PX: AV NODE ABLATION: EP1193

## 2017-08-27 HISTORY — DX: Other chronic pain: G89.29

## 2017-08-27 HISTORY — PX: PACEMAKER IMPLANT: EP1218

## 2017-08-27 SURGERY — PACEMAKER IMPLANT

## 2017-08-27 MED ORDER — CEFAZOLIN SODIUM-DEXTROSE 2-4 GM/100ML-% IV SOLN: 2.0000 g | INTRAVENOUS | Status: DC

## 2017-08-27 MED ORDER — SODIUM CHLORIDE 0.9 % IV SOLN
INTRAVENOUS | Status: DC
  Administered 2017-08-27: 13:00:00 via INTRAVENOUS

## 2017-08-27 MED ORDER — ENSURE ENLIVE PO LIQD: 237.0000 mL | Freq: Two times a day (BID) | ORAL | Status: DC

## 2017-08-27 MED ORDER — SODIUM CHLORIDE 0.9 % IR SOLN
80.0000 mg | Status: DC
  Filled 2017-08-27: qty 2

## 2017-08-27 MED ORDER — MIDAZOLAM HCL 5 MG/5ML IJ SOLN
INTRAMUSCULAR | Status: AC
  Filled 2017-08-27: qty 5

## 2017-08-27 MED ORDER — CYCLOBENZAPRINE HCL 5 MG PO TABS
5.0000 mg | ORAL_TABLET | Freq: Three times a day (TID) | ORAL | Status: DC | PRN
  Administered 2017-08-27: 5 mg via ORAL
  Filled 2017-08-27: qty 1

## 2017-08-27 MED ORDER — CEFAZOLIN SODIUM-DEXTROSE 2-3 GM-%(50ML) IV SOLR
INTRAVENOUS | Status: AC | PRN
  Administered 2017-08-27: 2 g via INTRAVENOUS

## 2017-08-27 MED ORDER — DULOXETINE HCL 60 MG PO CPEP
60.0000 mg | ORAL_CAPSULE | Freq: Every day | ORAL | Status: DC
  Administered 2017-08-27 – 2017-08-28 (×2): 60 mg via ORAL
  Filled 2017-08-27 (×2): qty 1

## 2017-08-27 MED ORDER — HEPARIN (PORCINE) IN NACL 2-0.9 UNIT/ML-% IJ SOLN
INTRAMUSCULAR | Status: AC
  Filled 2017-08-27: qty 500

## 2017-08-27 MED ORDER — FENTANYL CITRATE (PF) 100 MCG/2ML IJ SOLN
INTRAMUSCULAR | Status: AC
Start: 2017-08-27 — End: ?
  Filled 2017-08-27: qty 2

## 2017-08-27 MED ORDER — LIDOCAINE HCL (PF) 1 % IJ SOLN
INTRAMUSCULAR | Status: AC
  Filled 2017-08-27: qty 30

## 2017-08-27 MED ORDER — ACETAMINOPHEN 325 MG PO TABS
325.0000 mg | ORAL_TABLET | ORAL | Status: DC | PRN
  Administered 2017-08-28: 650 mg via ORAL
  Filled 2017-08-27: qty 2

## 2017-08-27 MED ORDER — ONDANSETRON HCL 4 MG/2ML IJ SOLN: 4.0000 mg | Freq: Four times a day (QID) | INTRAMUSCULAR | Status: DC | PRN

## 2017-08-27 MED ORDER — CEFAZOLIN SODIUM-DEXTROSE 2-4 GM/100ML-% IV SOLN
INTRAVENOUS | Status: AC
  Filled 2017-08-27: qty 100

## 2017-08-27 MED ORDER — SODIUM CHLORIDE 0.9 % IV SOLN
INTRAVENOUS | Status: DC
  Administered 2017-08-27: 11:00:00 via INTRAVENOUS

## 2017-08-27 MED ORDER — MIDAZOLAM HCL 5 MG/5ML IJ SOLN
INTRAMUSCULAR | Status: DC | PRN
  Administered 2017-08-27 (×5): 1 mg via INTRAVENOUS

## 2017-08-27 MED ORDER — LIDOCAINE HCL (PF) 1 % IJ SOLN
INTRAMUSCULAR | Status: DC | PRN
  Administered 2017-08-27: 60 mL
  Administered 2017-08-27: 20 mL

## 2017-08-27 MED ORDER — ATORVASTATIN CALCIUM 20 MG PO TABS: 20.0000 mg | ORAL_TABLET | Freq: Every day | ORAL | Status: DC

## 2017-08-27 MED ORDER — SODIUM CHLORIDE 0.9 % IR SOLN
Status: DC | PRN
  Administered 2017-08-27: 16:00:00

## 2017-08-27 MED ORDER — LIDOCAINE HCL (PF) 1 % IJ SOLN
INTRAMUSCULAR | Status: AC
  Filled 2017-08-27: qty 60

## 2017-08-27 MED ORDER — CEFAZOLIN SODIUM-DEXTROSE 1-4 GM/50ML-% IV SOLN
1.0000 g | Freq: Four times a day (QID) | INTRAVENOUS | Status: DC
  Administered 2017-08-27 – 2017-08-28 (×2): 1 g via INTRAVENOUS
  Filled 2017-08-27 (×3): qty 50

## 2017-08-27 MED ORDER — CHLORHEXIDINE GLUCONATE 4 % EX LIQD: 60.0000 mL | Freq: Once | CUTANEOUS | Status: DC

## 2017-08-27 MED ORDER — GABAPENTIN 100 MG PO CAPS
100.0000 mg | ORAL_CAPSULE | Freq: Two times a day (BID) | ORAL | Status: DC
  Administered 2017-08-27 – 2017-08-28 (×2): 100 mg via ORAL
  Filled 2017-08-27 (×2): qty 1

## 2017-08-27 MED ORDER — FLUTICASONE PROPIONATE 50 MCG/ACT NA SUSP: 2.0000 | Freq: Every day | NASAL | Status: DC | PRN

## 2017-08-27 MED ORDER — FENTANYL CITRATE (PF) 100 MCG/2ML IJ SOLN
INTRAMUSCULAR | Status: DC | PRN
  Administered 2017-08-27: 25 ug via INTRAVENOUS
  Administered 2017-08-27 (×2): 12.5 ug via INTRAVENOUS
  Administered 2017-08-27: 25 ug via INTRAVENOUS

## 2017-08-27 SURGICAL SUPPLY — 24 items
CABLE SURGICAL S-101-97-12 (CABLE) ×3 IMPLANT
CATH CELSIUS THERMO F CV 7FR (ABLATOR) ×3 IMPLANT
CATH HEX JOSEPH 2-5-2 65CM 6F (CATHETERS) IMPLANT
CATH JOSEPHSON QUAD-ALLRED 6FR (CATHETERS) IMPLANT
CATH RIGHTSITE C315HIS02 (CATHETERS) ×3 IMPLANT
IPG PACE AZUR XT DR MRI W1DR01 (Pacemaker) ×1 IMPLANT
LEAD CAPSURE NOVUS 5076-52CM (Lead) ×3 IMPLANT
LEAD SELECT SECURE 3830 383069 (Lead) ×1 IMPLANT
PACE AZURE XT DR MRI W1DR01 (Pacemaker) ×3 IMPLANT
PACK EP LATEX FREE (CUSTOM PROCEDURE TRAY)
PACK EP LF (CUSTOM PROCEDURE TRAY) IMPLANT
PAD DEFIB LIFELINK (PAD) ×3 IMPLANT
SELECT SECURE 3830 383069 (Lead) ×3 IMPLANT
SHEATH CLASSIC 7F (SHEATH) ×6 IMPLANT
SHEATH CLASSIC 8F (SHEATH) IMPLANT
SHEATH CLASSIC 9.5F (SHEATH) IMPLANT
SHEATH CLASSIC 9F (SHEATH) IMPLANT
SHEATH PINNACLE 6F 10CM (SHEATH) IMPLANT
SHEATH PINNACLE 7F 10CM (SHEATH) IMPLANT
SHEATH PINNACLE 8F 10CM (SHEATH) ×3 IMPLANT
SHIELD RADPAD SCOOP 12X17 (MISCELLANEOUS) IMPLANT
SLITTER 6232ADJ (MISCELLANEOUS) ×3 IMPLANT
TRAY PACEMAKER INSERTION (PACKS) ×3 IMPLANT
WIRE HI TORQ VERSACORE-J 145CM (WIRE) ×3 IMPLANT

## 2017-08-27 NOTE — Discharge Summary (Signed)
ELECTROPHYSIOLOGY PROCEDURE DISCHARGE SUMMARY    Patient ID: Sabrina [name],  MRN: [id number], DOB/AGE: [birth year] 71 y.o.  Admit date: 08/27/2017 Discharge date: 08/28/17  Primary Care Physician: Sharion Balloon, FNP  Primary Cardiologist: Dr. Jacinta Shoe Electrophysiologist: Dr. Lovena Le  Primary Discharge Diagnosis:  1. Persistent AFib, RVR     CHA2DS2Vasc is 3, on Eliquis       Secondary Discharge Diagnosis:  1. COPD 2. HTN 3. Anxiety  Allergies  Allergen Reactions  . Codeine Nausea And Vomiting    Stomach pain also     Procedures This Admission:  1.  Implantation of a MDT dual chamber PPM on 08/27/17 by Dr Lovena Le.  The patient received   medtronic (serial number [id number]) right atrial/His bundle lead and a Medtronic (serial number [id number]) right ventricular lead, Medtronic (serial number [id number]) pacemaker There were no immediate post procedure complications. Followed by: 2. AV node ablation, 08/27/17, Dr. Lovena Le 3.  CXR on 08/28/17 demonstrated no pneumothorax status post device implantation.   Brief HPI: Sabrina [name] is a 71 y.o. female was seen by EP service during her last hospital stay.  She has now felt to be permanent AFib with RVR, difficult to control with medicines 2/2 to BP limitations. Past medical history includes AFib, HTN, anxiety, fibromyalgia, COPD.  Was recommended to undergo PPM implant with AVNode ablation. Risks, benefits, and alternatives to both procedures were reviewed with the patient who wished to proceed.   Hospital Course:  The patient was admitted and underwent implantation of a PPM and an AV node ablation with details as outlined above.  She was monitored on telemetry overnight which demonstrated V paced rhythm.  Left chest was without hematoma or ecchymosis.  R groin was also stable without , bleeding, hematoma, or discomfort   The device was interrogated and found to be functioning normally.  CXR was obtained and demonstrated  no pneumothorax status post device implantation.  Wound care, arm mobility, and restrictions were reviewed with the patient.  The patient was examined by Dr. Lovena Le and considered stable for discharge to home. Will stop her verapamil and reduce her metoprolol to BID.   Physical Exam: Vitals:   08/28/17 0000 08/28/17 0200 08/28/17 0500 08/28/17 0746  BP: 127/78 93/66 107/77 113/83  Pulse: 89 88 89 90  Resp:  16 16 16   Temp:  (!) 97.5 F (36.4 C) 97.9 F (36.6 C) 98.7 F (37.1 C)  TempSrc:  Oral Oral Oral  SpO2:  94% 95% 100%  Weight:   190 lb 6.4 oz (86.4 kg)   Height:        GEN- The patient is well appearing, alert and oriented x 3 today.   HEENT: normocephalic, atraumatic; sclera clear, conjunctiva pink; hearing intact; oropharynx clear; neck supple, no JVP Lungs- CTA b/l, normal work of breathing.  No wheezes, rales, rhonchi Heart- RRR, no murmurs, rubs or gallops, PMI not laterally displaced GI- soft, non-tender, non-distended Extremities- no clubbing, cyanosis, or edema; R groin no bleeding, hematoma, is non-tender MS- no significant deformity or atrophy Skin- warm and dry, no rash or lesion, left chest without hematoma/ecchymosis Psych- euthymic mood, full affect Neuro- no gross deficits   Labs:   Lab Results  Component Value Date   WBC 8.6 08/22/2017   HGB 13.9 08/22/2017   HCT 43.0 08/22/2017   MCV 91.5 08/22/2017   PLT 332 08/22/2017    Recent Labs  Lab 08/23/17 0430  NA 138  K 3.7  CL  107  CO2 23  BUN 21*  CREATININE 0.80  CALCIUM 8.8*  GLUCOSE 95    Discharge Medications:  Allergies as of 08/28/2017      Reactions   Codeine Nausea And Vomiting   Stomach pain also      Medication List    STOP taking these medications   verapamil 40 MG tablet Commonly known as:  CALAN     TAKE these medications   atorvastatin 20 MG tablet Commonly known as:  LIPITOR Take 20 mg by mouth daily.   B-12 PO Take 1 tablet by mouth daily.     budesonide-formoterol 160-4.5 MCG/ACT inhaler Commonly known as:  SYMBICORT Inhale 2 puffs into the lungs 2 (two) times daily as needed (for asthma flares).   cyclobenzaprine 5 MG tablet Commonly known as:  FLEXERIL Take 1 tablet (5 mg total) by mouth 3 (three) times daily as needed for muscle spasms.   DULoxetine 60 MG capsule Commonly known as:  CYMBALTA TAKE 1 CAPSULE (60 MG TOTAL) BY MOUTH DAILY.   ELIQUIS 5 MG Tabs tablet Generic drug:  apixaban TAKE 1 TABLET (5 MG TOTAL) BY MOUTH 2 (TWO) TIMES DAILY.   FISH OIL PO Take 1 capsule by mouth daily.   fluticasone 50 MCG/ACT nasal spray Commonly known as:  FLONASE Place 2 sprays into both nostrils daily. What changed:    when to take this  reasons to take this   gabapentin 100 MG capsule Commonly known as:  NEURONTIN Take 1 capsule (100 mg total) by mouth 2 (two) times daily.   metoprolol tartrate 25 MG tablet Commonly known as:  LOPRESSOR Take 1 tablet (25 mg total) by mouth 2 (two) times daily. What changed:  when to take this   ONE-A-DAY 50 PLUS PO Take 1 tablet by mouth 2 (two) times daily.   PRESCRIPTION MEDICATION Apply 1 application topically daily as needed (excema). HAL Ointment   Vitamin D (Ergocalciferol) 50000 units Caps capsule Commonly known as:  DRISDOL TAKE 1 CAPSULE (50,000 UNITS TOTAL) BY MOUTH EVERY 7 (SEVEN) DAYS. What changed:  See the new instructions.       Disposition:  Home  Discharge Instructions    Diet - low sodium heart healthy   Complete by:  As directed    Increase activity slowly   Complete by:  As directed      Follow-up Information    [facility] Follow up on 09/09/2017.   Specialty:  Cardiology Why:  9:00AM, wound check visit Contact information: [address], [address] [place]       Evans Lance, MD Follow up on 11/26/2017.   Specialty:  Cardiology Why:  9:30AM Contact information: [address] [place] [address] [phone]        Sharion Balloon, FNP. Go on 09/01/2017.   Specialty:  Family Medicine Why:  @3 :55pm Contact information: [place] [address] [phone]           Duration of Discharge Encounter: Greater than 30 minutes including physician time.  Venetia Night, PA-C 08/28/2017 11:00 AM  EP attending  Patient seen and examined.  Agree with the findings as noted above.  She is stable for discharge.  Usual follow-up.  Chest x-ray and pacemaker interrogation under my direction are normal.  Crissie Sickles, MD

## 2017-08-27 NOTE — Progress Notes (Signed)
Site area: rt groin fv sheath Site Prior to Removal:  Level 0 Pressure Applied For: 10 minutes Manual:   yes Patient Status During Pull:  stable Post Pull Site:  Level  0 Post Pull Instructions Given:  yes Post Pull Pulses Present: palpable Dressing Applied:  Gauze and tegaderm Bedrest begins @  5916 Comments:

## 2017-08-27 NOTE — Interval H&P Note (Signed)
History and Physical Interval Note:  08/27/2017 2:59 PM  Sabrina [name]  has presented today for surgery, with the diagnosis of tachibradie  The various methods of treatment have been discussed with the patient and family. After consideration of risks, benefits and other options for treatment, the patient has consented to  Procedure(s): PACEMAKER IMPLANT (N/A) AV NODE ABLATION (N/A) as a surgical intervention .  The patient's history has been reviewed, patient examined, no change in status, stable for surgery.  I have reviewed the patient's chart and labs.  Questions were answered to the patient's satisfaction.     Sabrina [name]

## 2017-08-27 NOTE — Discharge Instructions (Signed)
Groin site care instructions No vigorous or sexual activity for 1 week.  Keep procedure site clean & dry. If you notice increased pain, swelling, bleeding or pus, call/return!  You may shower in 1 week (as instructed below), no soaking baths/hot tubs/pools for 1 week.         Supplemental Discharge Instructions for  Pacemaker/Defibrillator Patients  Activity No heavy lifting or vigorous activity with your left/right arm for 6 to 8 weeks.  Do not raise your left/right arm above your head for one week.  Gradually raise your affected arm as drawn below.             08/31/17                    09/01/17                  09/02/17                09/03/17 __  NO DRIVING for  1 week   ; you may begin driving on   16/60/63  .  WOUND CARE - Keep the wound area clean and dry.  Do not get this area wet for one week. No showers for one week; you may shower on 09/03/17  . - The tape/steri-strips on your wound will fall off; do not pull them off.  No bandage is needed on the site.  DO  NOT apply any creams, oils, or ointments to the wound area. - If you notice any drainage or discharge from the wound, any swelling or bruising at the site, or you develop a fever > 101? F after you are discharged home, call the office at once.  Special Instructions - You are still able to use cellular telephones; use the ear opposite the side where you have your pacemaker/defibrillator.  Avoid carrying your cellular phone near your device. - When traveling through airports, show security personnel your identification card to avoid being screened in the metal detectors.  Ask the security personnel to use the hand wand. - Avoid arc welding equipment, MRI testing (magnetic resonance imaging), TENS units (transcutaneous nerve stimulators).  Call the office for questions about other devices. - Avoid electrical appliances that are in poor condition or are not properly grounded. - Microwave ovens are safe to be near or to  operate.  Additional information for defibrillator patients should your device go off: - If your device goes off ONCE and you feel fine afterward, notify the device clinic nurses. - If your device goes off ONCE and you do not feel well afterward, call 911. - If your device goes off TWICE, call 911. - If your device goes off THREE times in one day, call 911.  DO NOT DRIVE YOURSELF OR A FAMILY MEMBER WITH A DEFIBRILLATOR TO THE HOSPITAL--CALL 911.

## 2017-08-28 ENCOUNTER — Encounter (HOSPITAL_COMMUNITY): Payer: Self-pay | Admitting: Internal Medicine

## 2017-08-28 ENCOUNTER — Other Ambulatory Visit: Payer: Self-pay

## 2017-08-28 ENCOUNTER — Ambulatory Visit (HOSPITAL_COMMUNITY): Payer: Medicare HMO

## 2017-08-28 DIAGNOSIS — F419 Anxiety disorder, unspecified: Secondary | ICD-10-CM | POA: Diagnosis not present

## 2017-08-28 DIAGNOSIS — J449 Chronic obstructive pulmonary disease, unspecified: Secondary | ICD-10-CM | POA: Diagnosis not present

## 2017-08-28 DIAGNOSIS — I481 Persistent atrial fibrillation: Secondary | ICD-10-CM | POA: Diagnosis not present

## 2017-08-28 DIAGNOSIS — E669 Obesity, unspecified: Secondary | ICD-10-CM | POA: Diagnosis not present

## 2017-08-28 DIAGNOSIS — Z79899 Other long term (current) drug therapy: Secondary | ICD-10-CM | POA: Diagnosis not present

## 2017-08-28 DIAGNOSIS — J9811 Atelectasis: Secondary | ICD-10-CM | POA: Diagnosis not present

## 2017-08-28 DIAGNOSIS — Z7901 Long term (current) use of anticoagulants: Secondary | ICD-10-CM | POA: Diagnosis not present

## 2017-08-28 DIAGNOSIS — Z6832 Body mass index (BMI) 32.0-32.9, adult: Secondary | ICD-10-CM | POA: Diagnosis not present

## 2017-08-28 DIAGNOSIS — Z885 Allergy status to narcotic agent status: Secondary | ICD-10-CM | POA: Diagnosis not present

## 2017-08-28 DIAGNOSIS — I10 Essential (primary) hypertension: Secondary | ICD-10-CM | POA: Diagnosis not present

## 2017-08-28 MED ORDER — METOPROLOL TARTRATE 25 MG PO TABS: 25.0000 mg | ORAL_TABLET | Freq: Two times a day (BID) | ORAL | 6 refills | Status: DC

## 2017-08-28 MED FILL — Heparin Sodium (Porcine) 2 Unit/ML in Sodium Chloride 0.9%: INTRAMUSCULAR | Qty: 500 | Status: AC

## 2017-08-28 NOTE — Progress Notes (Signed)
Nutrition Brief Note  Patient identified on the Malnutrition Screening Tool (MST) Report  Wt Readings from Last 15 Encounters:  08/28/17 190 lb 6.4 oz (86.4 kg)  08/24/17 185 lb 13.6 oz (84.3 kg)  08/21/17 183 lb (83 kg)  07/21/17 184 lb (83.5 kg)  07/14/17 183 lb 12.8 oz (83.4 kg)  05/19/17 184 lb 12.8 oz (83.8 kg)  02/23/17 188 lb 9.6 oz (85.5 kg)  02/05/17 188 lb (85.3 kg)  09/26/16 183 lb 2 oz (83.1 kg)  09/25/16 184 lb 6.4 oz (83.6 kg)  08/22/16 193 lb (87.5 kg)  08/18/16 193 lb 8 oz (87.8 kg)  06/23/16 182 lb 9.6 oz (82.8 kg)  06/17/16 187 lb (84.8 kg)  06/12/16 186 lb (84.4 kg)   Sabrina [name] is a pleasant 71 yo F with difficult to control atrial fibrillation who presents for stabilization and EP evaluation.  S/p Procedure(s) on 08/27/17: PACEMAKER IMPLANT (N/A) AV NODE ABLATION (N/A)  Pt with active discharge orders to home. Noted wt gain over the past year. Will d/c Ensure Enlive.   Body mass index is 32.68 kg/m. Patient meets criteria for obesity, class I based on current BMI.   Current diet order is Heart Healthy, patient is consuming approximately 100% of meals at this time. Labs and medications reviewed.   No nutrition interventions warranted at this time. If nutrition issues arise, please consult RD.   Sabrina [name], RD, LDN, CDE Pager: [phone] After hours Pager: [phone]

## 2017-08-28 NOTE — Plan of Care (Signed)
  Progressing Education: Knowledge of General Education information will improve 08/28/2017 0021 - Progressing by Ruben Im, RN Health Behavior/Discharge Planning: Ability to manage health-related needs will improve 08/28/2017 0021 - Progressing by Ruben Im, RN Clinical Measurements: Ability to maintain clinical measurements within normal limits will improve 08/28/2017 0021 - Progressing by Ruben Im, RN Will remain free from infection 08/28/2017 0021 - Progressing by Ruben Im, RN Diagnostic test results will improve 08/28/2017 0021 - Progressing by Ruben Im, RN Respiratory complications will improve 08/28/2017 0021 - Progressing by Ruben Im, RN Cardiovascular complication will be avoided 08/28/2017 0021 - Progressing by Ruben Im, RN Activity: Risk for activity intolerance will decrease 08/28/2017 0021 - Progressing by Ruben Im, RN Elimination: Will not experience complications related to bowel motility 08/28/2017 0021 - Progressing by Ruben Im, RN Will not experience complications related to urinary retention 08/28/2017 0021 - Progressing by Ruben Im, RN Pain Managment: General experience of comfort will improve 08/28/2017 0021 - Progressing by Ruben Im, RN Safety: Ability to remain free from injury will improve 08/28/2017 0021 - Progressing by Ruben Im, RN Skin Integrity: Risk for impaired skin integrity will decrease 08/28/2017 0021 - Progressing by Ruben Im, RN

## 2017-09-01 ENCOUNTER — Ambulatory Visit: Payer: Medicare HMO | Admitting: Family

## 2017-09-01 ENCOUNTER — Encounter (HOSPITAL_COMMUNITY): Payer: Self-pay | Admitting: Internal Medicine

## 2017-09-03 ENCOUNTER — Ambulatory Visit (INDEPENDENT_AMBULATORY_CARE_PROVIDER_SITE_OTHER): Payer: Medicare HMO | Admitting: Family

## 2017-09-03 ENCOUNTER — Encounter: Payer: Self-pay | Admitting: Family

## 2017-09-03 VITALS — BP 104/85 | HR 97 | Temp 97.2°F | Ht 64.0 in | Wt 185.0 lb

## 2017-09-03 DIAGNOSIS — I4891 Unspecified atrial fibrillation: Secondary | ICD-10-CM | POA: Diagnosis not present

## 2017-09-03 DIAGNOSIS — R0602 Shortness of breath: Secondary | ICD-10-CM

## 2017-09-03 DIAGNOSIS — I5032 Chronic diastolic (congestive) heart failure: Secondary | ICD-10-CM

## 2017-09-03 DIAGNOSIS — Z09 Encounter for follow-up examination after completed treatment for conditions other than malignant neoplasm: Secondary | ICD-10-CM

## 2017-09-03 NOTE — Patient Instructions (Signed)
Atrial Fibrillation Atrial fibrillation is a type of irregular or rapid heartbeat (arrhythmia). In atrial fibrillation, the heart quivers continuously in a chaotic pattern. This occurs when parts of the heart receive disorganized signals that make the heart unable to pump blood normally. This can increase the risk for stroke, heart failure, and other heart-related conditions. There are different types of atrial fibrillation, including:  Paroxysmal atrial fibrillation. This type starts suddenly, and it usually stops on its own shortly after it starts.  Persistent atrial fibrillation. This type often lasts longer than a week. It may stop on its own or with treatment.  Long-lasting persistent atrial fibrillation. This type lasts longer than 12 months.  Permanent atrial fibrillation. This type does not go away.  Talk with your health care provider to learn about the type of atrial fibrillation that you have. What are the causes? This condition is caused by some heart-related conditions or procedures, including:  A heart attack.  Coronary artery disease.  Heart failure.  Heart valve conditions.  High blood pressure.  Inflammation of the sac that surrounds the heart (pericarditis).  Heart surgery.  Certain heart rhythm disorders, such as Wolf-Parkinson-White syndrome.  Other causes include:  Pneumonia.  Obstructive sleep apnea.  Blockage of an artery in the lungs (pulmonary embolism, or PE).  Lung cancer.  Chronic lung disease.  Thyroid problems, especially if the thyroid is overactive (hyperthyroidism).  Caffeine.  Excessive alcohol use or illegal drug use.  Use of some medicines, including certain decongestants and diet pills.  Sometimes, the cause cannot be found. What increases the risk? This condition is more likely to develop in:  People who are older in age.  People who smoke.  People who have diabetes mellitus.  People who are overweight  (obese).  Athletes who exercise vigorously.  What are the signs or symptoms? Symptoms of this condition include:  A feeling that your heart is beating rapidly or irregularly.  A feeling of discomfort or pain in your chest.  Shortness of breath.  Sudden light-headedness or weakness.  Getting tired easily during exercise.  In some cases, there are no symptoms. How is this diagnosed? Your health care provider may be able to detect atrial fibrillation when taking your pulse. If detected, this condition may be diagnosed with:  An electrocardiogram (ECG).  A Holter monitor test that records your heartbeat patterns over a 24-hour period.  Transthoracic echocardiogram (TTE) to evaluate how blood flows through your heart.  Transesophageal echocardiogram (TEE) to view more detailed images of your heart.  A stress test.  Imaging tests, such as a CT scan or chest X-ray.  Blood tests.  How is this treated? The main goals of treatment are to prevent blood clots from forming and to keep your heart beating at a normal rate and rhythm. The type of treatment that you receive depends on many factors, such as your underlying medical conditions and how you feel when you are experiencing atrial fibrillation. This condition may be treated with:  Medicine to slow down the heart rate, bring the heart's rhythm back to normal, or prevent clots from forming.  Electrical cardioversion. This is a procedure that resets your heart's rhythm by delivering a controlled, low-energy shock to the heart through your skin.  Different types of ablation, such as catheter ablation, catheter ablation with pacemaker, or surgical ablation. These procedures destroy the heart tissues that send abnormal signals. When the pacemaker is used, it is placed under your skin to help your heart beat in   a regular rhythm.  Follow these instructions at home:  Take over-the counter and prescription medicines only as told by your  health care provider.  If your health care provider prescribed a blood-thinning medicine (anticoagulant), take it exactly as told. Taking too much blood-thinning medicine can cause bleeding. If you do not take enough blood-thinning medicine, you will not have the protection that you need against stroke and other problems.  Do not use tobacco products, including cigarettes, chewing tobacco, and e-cigarettes. If you need help quitting, ask your health care provider.  If you have obstructive sleep apnea, manage your condition as told by your health care provider.  Do not drink alcohol.  Do not drink beverages that contain caffeine, such as coffee, soda, and tea.  Maintain a healthy weight. Do not use diet pills unless your health care provider approves. Diet pills may make heart problems worse.  Follow diet instructions as told by your health care provider.  Exercise regularly as told by your health care provider.  Keep all follow-up visits as told by your health care provider. This is important. How is this prevented?  Avoid drinking beverages that contain caffeine or alcohol.  Avoid certain medicines, especially medicines that are used for breathing problems.  Avoid certain herbs and herbal medicines, such as those that contain ephedra or ginseng.  Do not use illegal drugs, such as cocaine and amphetamines.  Do not smoke.  Manage your high blood pressure. Contact a health care provider if:  You notice a change in the rate, rhythm, or strength of your heartbeat.  You are taking an anticoagulant and you notice increased bruising.  You tire more easily when you exercise or exert yourself. Get help right away if:  You have chest pain, abdominal pain, sweating, or weakness.  You feel nauseous.  You notice blood in your vomit, bowel movement, or urine.  You have shortness of breath.  You suddenly have swollen feet and ankles.  You feel dizzy.  You have sudden weakness or  numbness of the face, arm, or leg, especially on one side of the body.  You have trouble speaking, trouble understanding, or both (aphasia).  Your face or your eyelid droops on one side. These symptoms may represent a serious problem that is an emergency. Do not wait to see if the symptoms will go away. Get medical help right away. Call your local emergency services (911 in the [place] Do not drive yourself to the hospital. This information is not intended to replace advice given to you by your health care provider. Make sure you discuss any questions you have with your health care provider. Document Released: 09/08/2005 Document Revised: 01/16/2016 Document Reviewed: 01/03/2015 Elsevier Interactive Patient Education  2017 [employer]  

## 2017-09-03 NOTE — Progress Notes (Signed)
   Subjective:    Patient ID: Sabrina [name], female    DOB: Dec 29, 1945, 71 y.o.   MRN: [id number]  HPI Pt presents to the office today for hospital follow up for A Fib. Pt was seen by her Cardiologists on 08/21/17 and advised to go to [facility]. Pt was admitted on 08/21/17 and discharged on 08/24/17 for AFIB with RVR and started on Cardizem drip and given IVF.   Pt had pacemaker and AV node ablation on 08/27/17 outpatient. Pt states she has been doing "really good". States her eye sight is better and no longer having dizziness. Pt states she has started doing her recumbent bike for 40 mins today. States she is weighing daily.   Denies any SOB, chest pain, pain, or palpitations. Has follow up appt with Cardiologists next week. Pt taking Eliquis 5 mg BID and metoprolol.    Review of Systems  All other systems reviewed and are negative.      Objective:   Physical Exam  Constitutional: She is oriented to person, place, and time. She appears well-developed and well-nourished. No distress.  HENT:  Head: Normocephalic and atraumatic.  Right Ear: External ear normal.  Left Ear: External ear normal.  Nose: Nose normal.  Mouth/Throat: Oropharynx is clear and moist.  Neck: Normal range of motion. Neck supple. No thyromegaly present.  Cardiovascular: Normal rate, regular rhythm, normal heart sounds and intact distal pulses.  No murmur heard. Pulmonary/Chest: Effort normal and breath sounds normal. No respiratory distress. She has no wheezes.  Abdominal: Soft. Bowel sounds are normal. She exhibits no distension. There is no tenderness.  Musculoskeletal: Normal range of motion. She exhibits no edema or tenderness.  Neurological: She is alert and oriented to person, place, and time.  Skin: Skin is warm and dry.  Steri strips intact on chest, no bleeding or redness present  Psychiatric: She has a normal mood and affect. Her behavior is normal. Judgment and thought content normal.  Vitals  reviewed.    BP 104/85   Pulse 97   Temp (!) 97.2 F (36.2 C) (Oral)   Ht '5\' 4"'$  (1.626 m)   Wt 185 lb (83.9 kg)   BMI 31.76 kg/m      Assessment & Plan:  1. Atrial fibrillation with RVR (HCC) - CBC with Differential/Platelet - BMP8+EGFR  2. Chronic diastolic heart failure (HCC) - CBC with Differential/Platelet - BMP8+EGFR  3. SOB (shortness of breath)  - CBC with Differential/Platelet - BMP8+EGFR  4. Hospital discharge follow-up - CBC with Differential/Platelet - BMP8+EGFR   Continue all meds Labs pending Health Maintenance reviewed Diet and exercise encouraged RTO 3 months and keep all Cardiologists appts   Evelina Dun, FNP

## 2017-09-04 LAB — BMP8+EGFR
BUN/Creatinine Ratio: 32 — ABNORMAL HIGH (ref 12–28)
BUN: 24 mg/dL (ref 8–27)
CALCIUM: 9.1 mg/dL (ref 8.7–10.3)
CHLORIDE: 98 mmol/L (ref 96–106)
CO2: 36 mmol/L — ABNORMAL HIGH (ref 20–29)
CREATININE: 0.75 mg/dL (ref 0.57–1.00)
GFR, EST AFRICAN AMERICAN: 93 mL/min/{1.73_m2} (ref >59)
GFR, EST NON AFRICAN AMERICAN: 81 mL/min/{1.73_m2} (ref >59)
Glucose: 89 mg/dL (ref 65–99)
Potassium: 4.4 mmol/L (ref 3.5–5.2)
Sodium: 142 mmol/L (ref 134–144)

## 2017-09-04 LAB — CBC WITH DIFFERENTIAL/PLATELET
Basophils Absolute: 0.1 10*3/uL (ref 0.0–0.2)
Basos: 1 %
EOS (ABSOLUTE): 0.3 10*3/uL (ref 0.0–0.4)
EOS: 4 %
HEMATOCRIT: 44.1 % (ref 34.0–46.6)
HEMOGLOBIN: 14.5 g/dL (ref 11.1–15.9)
IMMATURE GRANS (ABS): 0 10*3/uL (ref 0.0–0.1)
IMMATURE GRANULOCYTES: 0 %
LYMPHS: 30 %
Lymphocytes Absolute: 2.8 10*3/uL (ref 0.7–3.1)
MCH: 29.4 pg (ref 26.6–33.0)
MCHC: 32.9 g/dL (ref 31.5–35.7)
MCV: 90 fL (ref 79–97)
MONOCYTES: 7 %
Monocytes Absolute: 0.7 10*3/uL (ref 0.1–0.9)
NEUTROS ABS: 5.4 10*3/uL (ref 1.4–7.0)
NEUTROS PCT: 58 %
PLATELETS: 381 10*3/uL — AB (ref 150–379)
RBC: 4.93 x10E6/uL (ref 3.77–5.28)
RDW: 15.1 % (ref 12.3–15.4)
WBC: 9.3 10*3/uL (ref 3.4–10.8)

## 2017-09-09 ENCOUNTER — Ambulatory Visit: Payer: Medicare HMO

## 2017-09-21 ENCOUNTER — Encounter: Payer: Self-pay | Admitting: Nurse Practitioner

## 2017-09-21 ENCOUNTER — Ambulatory Visit (INDEPENDENT_AMBULATORY_CARE_PROVIDER_SITE_OTHER): Payer: Medicare HMO | Admitting: *Deleted

## 2017-09-21 DIAGNOSIS — I480 Paroxysmal atrial fibrillation: Secondary | ICD-10-CM

## 2017-09-21 LAB — CUP PACEART INCLINIC DEVICE CHECK
Battery Remaining Longevity: 36 mo
Battery Voltage: 3.12 V
Brady Statistic AP VP Percent: 99.9 %
Brady Statistic AS VS Percent: 0.04 %
Brady Statistic RA Percent Paced: 99.98 %
Brady Statistic RV Percent Paced: 99.9 %
Date Time Interrogation Session: 20181231164516
Implantable Lead Location: 753860
Implantable Lead Model: 3830
Implantable Lead Model: 5076
Implantable Pulse Generator Implant Date: 20181206
Lead Channel Impedance Value: 323 Ohm
Lead Channel Impedance Value: 437 Ohm
Lead Channel Sensing Intrinsic Amplitude: 4.625 mV
Lead Channel Setting Pacing Amplitude: 3.5 V
Lead Channel Setting Pacing Amplitude: 5 V
Lead Channel Setting Pacing Pulse Width: 0.4 ms
MDC IDC LEAD IMPLANT DT: 20181206
MDC IDC LEAD IMPLANT DT: 20181206
MDC IDC LEAD LOCATION: 753860
MDC IDC MSMT LEADCHNL RV IMPEDANCE VALUE: 437 Ohm
MDC IDC MSMT LEADCHNL RV IMPEDANCE VALUE: 608 Ohm
MDC IDC MSMT LEADCHNL RV SENSING INTR AMPL: 9.625 mV
MDC IDC MSMT LEADCHNL RV SENSING INTR AMPL: 9.625 mV
MDC IDC SET LEADCHNL RV SENSING SENSITIVITY: 2 mV
MDC IDC STAT BRADY AP VS PERCENT: 0.05 %
MDC IDC STAT BRADY AS VP PERCENT: 0 %

## 2017-09-21 NOTE — Progress Notes (Signed)
Wound check appointment. Steri-strips removed. Wound without redness or edema. Incision edges approximated, wound well healed. Normal device function. Thresholds, sensing, and impedances consistent with implant measurements. Device programmed at 5V/3.5V s/p AVN ablation. Histogram distribution appropriate for patient and level of activity. No mode switches or high ventricular rates noted. Lower rate adjusted to 80 BPM. Patient educated about wound care, arm mobility, lifting restrictions. ROV with DC 1/14 and GT 3/7

## 2017-10-04 ENCOUNTER — Other Ambulatory Visit: Payer: Self-pay | Admitting: Family

## 2017-10-04 DIAGNOSIS — F411 Generalized anxiety disorder: Secondary | ICD-10-CM

## 2017-10-05 ENCOUNTER — Ambulatory Visit (INDEPENDENT_AMBULATORY_CARE_PROVIDER_SITE_OTHER): Payer: Medicare HMO | Admitting: *Deleted

## 2017-10-05 ENCOUNTER — Other Ambulatory Visit: Payer: Self-pay | Admitting: Nurse Practitioner

## 2017-10-05 DIAGNOSIS — I480 Paroxysmal atrial fibrillation: Secondary | ICD-10-CM

## 2017-10-05 DIAGNOSIS — M26609 Unspecified temporomandibular joint disorder, unspecified side: Secondary | ICD-10-CM

## 2017-10-05 LAB — CUP PACEART INCLINIC DEVICE CHECK
Brady Statistic AP VS Percent: 0.06 %
Brady Statistic AS VP Percent: 0 %
Brady Statistic RA Percent Paced: 99.99 %
Brady Statistic RV Percent Paced: 99.9 %
Date Time Interrogation Session: 20190114164409
Implantable Lead Implant Date: 20181206
Implantable Lead Implant Date: 20181206
Implantable Lead Model: 3830
Implantable Pulse Generator Implant Date: 20181206
Lead Channel Impedance Value: 418 Ohm
Lead Channel Impedance Value: 437 Ohm
Lead Channel Impedance Value: 589 Ohm
Lead Channel Sensing Intrinsic Amplitude: 9.625 mV
Lead Channel Setting Pacing Amplitude: 5 V
Lead Channel Setting Sensing Sensitivity: 2 mV
MDC IDC LEAD LOCATION: 753860
MDC IDC LEAD LOCATION: 753860
MDC IDC MSMT BATTERY REMAINING LONGEVITY: 35 mo
MDC IDC MSMT BATTERY VOLTAGE: 3.09 V
MDC IDC MSMT LEADCHNL RA IMPEDANCE VALUE: 304 Ohm
MDC IDC MSMT LEADCHNL RA SENSING INTR AMPL: 4.625 mV
MDC IDC MSMT LEADCHNL RV SENSING INTR AMPL: 9.625 mV
MDC IDC SET LEADCHNL RV PACING AMPLITUDE: 3.5 V
MDC IDC SET LEADCHNL RV PACING PULSEWIDTH: 0.4 ms
MDC IDC STAT BRADY AP VP PERCENT: 99.9 %
MDC IDC STAT BRADY AS VS PERCENT: 0.04 %

## 2017-10-05 NOTE — Progress Notes (Signed)
Patient presents to the office for f/u s/p AVN ablation. Base rate decreased to 70 bpm, RR turned on at nominal settings. Patient will follow up with AS on 2/6 and with GT on 3/7.

## 2017-10-08 ENCOUNTER — Other Ambulatory Visit: Payer: Self-pay | Admitting: Family

## 2017-10-09 ENCOUNTER — Telehealth: Payer: Self-pay | Admitting: *Deleted

## 2017-10-09 NOTE — Telephone Encounter (Signed)
Fax received Halobetasol Prop 0.05% cream is non-formulary Requesting alternative Please advise [facility]

## 2017-10-12 ENCOUNTER — Telehealth: Payer: Self-pay | Admitting: Cardiovascular Disease

## 2017-10-12 MED ORDER — FUROSEMIDE 20 MG PO TABS: ORAL_TABLET | ORAL | 3 refills | Status: DC

## 2017-10-12 MED ORDER — TRIAMCINOLONE ACETONIDE 0.5 % EX OINT: 1.0000 "application " | TOPICAL_OINTMENT | Freq: Two times a day (BID) | CUTANEOUS | 0 refills | Status: DC

## 2017-10-12 NOTE — Telephone Encounter (Signed)
Pt states she had pacemaker placed in December, she's had 9lbs weight gain in the past week.

## 2017-10-12 NOTE — Telephone Encounter (Signed)
Called pt to inform her to take her lasix 20-40 mg PRN for swelling and weight gain. She voiced understanding.

## 2017-10-12 NOTE — Addendum Note (Signed)
Addended by: Evelina Dun A on: 10/12/2017 01:53 PM   Modules accepted: Orders

## 2017-10-12 NOTE — Telephone Encounter (Signed)
Kenalog Prescription sent to pharmacy

## 2017-10-12 NOTE — Telephone Encounter (Signed)
Pt walked in office stating she has had a 9 il weight gain within the past week. She stated she has not had any problems with swelling for some time, until she got her pace maker placed, since she has noticed it more. She had taken lasix previously, but has not had to take any in quite a while. Please advise.

## 2017-10-12 NOTE — Telephone Encounter (Signed)
Can take Lasix 20-40 mg prn.

## 2017-10-23 ENCOUNTER — Other Ambulatory Visit: Payer: Self-pay | Admitting: Cardiovascular Disease

## 2017-10-27 ENCOUNTER — Encounter: Payer: Self-pay | Admitting: Nurse Practitioner

## 2017-10-27 NOTE — Progress Notes (Signed)
Electrophysiology Office Note Date: 10/28/2017  ID:  Sabrina, [name] 1946-07-11, MRN [id number]  PCP: Sabrina Balloon, FNP Primary Cardiologist: Sabrina [name] Electrophysiologist: Sabrina [name]  CC: AVN ablation and His Bundle pacemaker follow-up  Sabrina [name] is a 72 y.o. female seen today for Dr Sabrina [name].  She presents today for routine electrophysiology followup.  She was doing very well initially post PPM and AVN ablation but for the last 2 weeks has noticed increased edema. No shortness of breath.  She denies chest pain, palpitations, dyspnea, PND, orthopnea, nausea, vomiting, dizziness, syncope, weight gain, or early satiety.  Device History: MDT His bundle PPM implanted 2018 for AF with RVR and AVN ablation (His lead in RA port, RV lead in RV port)   Past Medical History:  Diagnosis Date  . Anemia   . Ankle fracture, right    past hx. -"no surgery"  . Anxiety   . Asthma   . CHF (congestive heart failure) ([place]) 2009  . Chronic lower back pain   . Collagen vascular disease ([place])   . COPD (chronic obstructive pulmonary disease) ([place])   . Depression   . Fibromyalgia   . GERD (gastroesophageal reflux disease)   . Hyperlipidemia   . Hypertension   . Immature cataract of both eyes   . Myocardial infarction ([place])    "I've had a light one; don't know when it happened" (08/27/2017)  . Osteoarthritis   . Peripheral neuropathy    legs and feet  . Persistent atrial fibrillation ([place])   . Tubular adenoma of colon    Past Surgical History:  Procedure Laterality Date  . APPENDECTOMY    . AV NODE ABLATION N/A 08/27/2017   Procedure: AV NODE ABLATION;  Surgeon: [name] Lance, MD;  Location: [facility];  Service: Cardiovascular;  Laterality: N/A;  . CHOLECYSTECTOMY OPEN  1978  . DILATION AND CURETTAGE OF UTERUS    . FEMUR FRACTURE SURGERY Left 2013   "put 7" rod in it"  . FRACTURE SURGERY    . LAPAROSCOPY  08/22/2016   Procedure: LAPAROSCOPY DIAGNOSTIC;  Surgeon:  Sabrina Ruff, MD;  Location: [facility];  Service: General;;  . MEDIAL PARTIAL KNEE REPLACEMENT Right 2005   "@ [facility]"  . PACEMAKER IMPLANT N/A 08/27/2017   Procedure: PACEMAKER IMPLANT;  Surgeon: [name] Lance, MD;  Location: [facility];  Service: Cardiovascular;  Laterality: N/A;  . ROUX-EN-Y GASTRIC BYPASS  2002   [place]  . SPLENECTOMY  2002  . TONSILLECTOMY  1944  . TUBAL LIGATION    . VAGINAL HYSTERECTOMY      Current Outpatient Medications  Medication Sig Dispense Refill  . atorvastatin (LIPITOR) 20 MG tablet Take 20 mg by mouth daily.    . budesonide-formoterol (SYMBICORT) 160-4.5 MCG/ACT inhaler Inhale 2 puffs into the lungs 2 (two) times daily as needed (for asthma flares).    . Cyanocobalamin (B-12 PO) Take 1 tablet by mouth daily.    . cyclobenzaprine (FLEXERIL) 5 MG tablet Take 1 tablet (5 mg total) by mouth 3 (three) times daily as needed for muscle spasms. 30 tablet 1  . diltiazem (CARDIZEM CD) 120 MG 24 hr capsule Take 1 capsule (120 mg total) by mouth daily. 30 capsule 3  . DULoxetine (CYMBALTA) 60 MG capsule TAKE 1 CAPSULE (60 MG TOTAL) BY MOUTH DAILY. 30 capsule 1  . DULoxetine (CYMBALTA) 60 MG capsule TAKE 1 CAPSULE BY MOUTH EVERY DAY 90 capsule 0  . ELIQUIS  5 MG TABS tablet TAKE 1 TABLET (5 MG TOTAL) BY MOUTH 2 (TWO) TIMES DAILY. 60 tablet 2  . fluticasone (FLONASE) 50 MCG/ACT nasal spray Place 2 sprays into both nostrils daily. (Patient taking differently: Place 2 sprays into both nostrils daily as needed for allergies or rhinitis. ) 16 g 5  . furosemide (LASIX) 20 MG tablet Take 20-40 mg PRN as needed for swelling and weight gain. 90 tablet 3  . gabapentin (NEURONTIN) 100 MG capsule TAKE 1 CAPSULE BY MOUTH TWICE A DAY 180 capsule 0  . metoprolol tartrate (LOPRESSOR) 25 MG tablet Take 1 tablet (25 mg total) by mouth 2 (two) times daily. 60 tablet 6  . Multiple Vitamins-Minerals (ONE-A-DAY 50 PLUS PO) Take 1 tablet by mouth 2 (two) times daily.     . Omega-3 Fatty Acids (FISH OIL PO) Take 1 capsule by mouth daily.    Marland Kitchen PRESCRIPTION MEDICATION Apply 1 application topically daily as needed (excema). HAL Ointment    . triamcinolone ointment (KENALOG) 0.5 % Apply 1 application topically 2 (two) times daily. 30 g 0  . Vitamin D, Ergocalciferol, (DRISDOL) 50000 units CAPS capsule TAKE 1 CAPSULE (50,000 UNITS TOTAL) BY MOUTH EVERY 7 (SEVEN) DAYS. (Patient taking differently: TAKE 1 CAPSULE (50,000 UNITS TOTAL) BY MOUTH EVERY Monday) 12 capsule 0   No current facility-administered medications for this visit.     Allergies:   Codeine   Social History: Social History   Socioeconomic History  . Marital status: Divorced    Spouse name: Not on file  . Number of children: Not on file  . Years of education: Not on file  . Highest education level: Not on file  Social Needs  . Financial resource strain: Not on file  . Food insecurity - worry: Not on file  . Food insecurity - inability: Not on file  . Transportation needs - medical: Not on file  . Transportation needs - non-medical: Not on file  Occupational History  . Not on file  Tobacco Use  . Smoking status: Former Smoker    Packs/day: 0.50    Years: 25.00    Pack years: 12.50    Types: Cigarettes    Last attempt to quit: 02/26/1993    Years since quitting: 24.6  . Smokeless tobacco: Never Used  Substance and Sexual Activity  . Alcohol use: No    Comment: 08/27/2017 "nothing since early 2000s"  . Drug use: No  . Sexual activity: Not Currently    Birth control/protection: Surgical  Other Topics Concern  . Not on file  Social History Narrative  . Not on file    Family History: Family History  Problem Relation Age of Onset  . Cancer Mother   . Alzheimer's disease Mother   . Arthritis Father   . Colon cancer Paternal Uncle 74     Review of Systems: All other systems reviewed and are otherwise negative except as noted above.   Physical Exam: VS:  BP 102/70   Pulse  77   Ht 5\' 4"  (1.626 m)   Wt 191 lb (86.6 kg)   SpO2 96%   BMI 32.79 kg/m  , BMI Body mass index is 32.79 kg/m.  GEN- The patient is elderly and obese appearing, alert and oriented x 3 today.   HEENT: normocephalic, atraumatic; sclera clear, conjunctiva pink; hearing intact; oropharynx clear; neck supple  Lungs- Clear to ausculation bilaterally, normal work of breathing.  No wheezes, rales, rhonchi Heart- Regular rate and rhythm (paced) GI-  soft, non-tender, non-distended, bowel sounds present  Extremities- no clubbing, cyanosis, or edema  MS- no significant deformity or atrophy Skin- warm and dry, no rash or lesion; PPM pocket well healed Psych- euthymic mood, full affect Neuro- strength and sensation are intact  PPM Interrogation- reviewed in detail today,  See PACEART report  EKG:  EKG is ordered today. The ekg ordered today shows atrial fibrillation with V pacing  Recent Labs: 07/14/2017: ALT 13 08/21/2017: Magnesium 2.0; TSH 5.181 09/03/2017: BUN 24; Creatinine, Ser 0.75; Hemoglobin 14.5; Platelets 381; Potassium 4.4; Sodium 142   Wt Readings from Last 3 Encounters:  10/28/17 191 lb (86.6 kg)  09/03/17 185 lb (83.9 kg)  08/28/17 190 lb 6.4 oz (86.4 kg)     Other studies Reviewed: Additional studies/ records that were reviewed today include: hospital records  Assessment and Plan:  1.   Persistent AF with RVR Doing well s/p AVN ablation and PPM implant Normal PPM function See Pace Art report No changes today His Bundle capture non selective down to loss of capture at 1.75V. She has an escape of 40bpm Continue Eliquis for CHADS2VASC of at least 3  2.  HTN BP has been stable at home Low today in office With swelling, will decrease Cardizem to 120mg  daily today and if BP stable at next office visit discontinue at that time.     Current medicines are reviewed at length with the patient today.   The patient does not have concerns regarding her medicines.  The  following changes were made today:  Decrease Cardizem to 120mg  daily   Labs/ tests ordered today include: none Orders Placed This Encounter  Procedures  . CUP PACEART [place]  . EKG 12-Lead     Disposition:   Follow up with Dr Sabrina [name] as scheduled     Signed, Chanetta Marshall, NP 10/28/2017 11:07 AM  [facility] [address] [place] [address] [phone] (office) [phone] (fax)

## 2017-10-28 ENCOUNTER — Encounter: Payer: Self-pay | Admitting: Nurse Practitioner

## 2017-10-28 ENCOUNTER — Ambulatory Visit (INDEPENDENT_AMBULATORY_CARE_PROVIDER_SITE_OTHER): Payer: Medicare HMO | Admitting: Nurse Practitioner

## 2017-10-28 VITALS — BP 102/70 | HR 77 | Ht 64.0 in | Wt 191.0 lb

## 2017-10-28 DIAGNOSIS — I4821 Permanent atrial fibrillation: Secondary | ICD-10-CM

## 2017-10-28 DIAGNOSIS — I442 Atrioventricular block, complete: Secondary | ICD-10-CM

## 2017-10-28 DIAGNOSIS — I10 Essential (primary) hypertension: Secondary | ICD-10-CM | POA: Diagnosis not present

## 2017-10-28 DIAGNOSIS — I482 Chronic atrial fibrillation: Secondary | ICD-10-CM

## 2017-10-28 LAB — CUP PACEART INCLINIC DEVICE CHECK
Implantable Lead Implant Date: 20181206
Implantable Lead Location: 753860
Implantable Lead Location: 753860
Implantable Lead Model: 3830
MDC IDC LEAD IMPLANT DT: 20181206
MDC IDC PG IMPLANT DT: 20181206
MDC IDC SESS DTM: 20190206102535

## 2017-10-28 MED ORDER — DILTIAZEM HCL ER COATED BEADS 120 MG PO CP24: 120.0000 mg | ORAL_CAPSULE | Freq: Every day | ORAL | 3 refills | Status: DC

## 2017-10-28 NOTE — Patient Instructions (Addendum)
Medication Instructions:    START TAKING DILTIAZEM  120 MG ONCE A DAY   If you need a refill on your cardiac medications before your next appointment, please call your pharmacy.  Labwork:  NONE ORDERED  TODAY     Testing/Procedures: NONE ORDERED  TODAY    Follow-Up: WITH DR Lovena Le AS SCHEDULED     Any Other Special Instructions Will Be Listed Below (If Applicable).

## 2017-11-13 ENCOUNTER — Other Ambulatory Visit: Payer: Self-pay | Admitting: Family

## 2017-11-13 NOTE — Telephone Encounter (Signed)
Last Vit D 10/19/15  14.5

## 2017-11-20 ENCOUNTER — Encounter: Payer: Self-pay | Admitting: *Deleted

## 2017-11-26 ENCOUNTER — Ambulatory Visit (INDEPENDENT_AMBULATORY_CARE_PROVIDER_SITE_OTHER): Payer: Medicare HMO | Admitting: Internal Medicine

## 2017-11-26 ENCOUNTER — Encounter: Payer: Self-pay | Admitting: Internal Medicine

## 2017-11-26 VITALS — BP 110/60 | HR 75 | Ht 64.0 in | Wt 196.0 lb

## 2017-11-26 DIAGNOSIS — Z950 Presence of cardiac pacemaker: Secondary | ICD-10-CM

## 2017-11-26 DIAGNOSIS — I4821 Permanent atrial fibrillation: Secondary | ICD-10-CM

## 2017-11-26 DIAGNOSIS — I482 Chronic atrial fibrillation: Secondary | ICD-10-CM

## 2017-11-26 DIAGNOSIS — I442 Atrioventricular block, complete: Secondary | ICD-10-CM

## 2017-11-26 NOTE — Patient Instructions (Signed)
Medication Instructions:  Your physician recommends that you continue on your current medications as directed. Please refer to the Current Medication list given to you today.  Labwork: None ordered.  Testing/Procedures: None ordered.  Follow-Up: Your physician wants you to follow-up in: 9 months with Dr. Lovena Le.   You will receive a reminder letter in the mail two months in advance. If you don't receive a letter, please call our office to schedule the follow-up appointment.  Remote monitoring is used to monitor your Pacemaker from home. This monitoring reduces the number of office visits required to check your device to one time per year. It allows [place] to keep an eye on the functioning of your device to ensure it is working properly. You are scheduled for a device check from home on 02/25/2018. You may send your transmission at any time that day. If you have a wireless device, the transmission will be sent automatically. After your physician reviews your transmission, you will receive a postcard with your next transmission date.  Any Other Special Instructions Will Be Listed Below (If Applicable).  If you need a refill on your cardiac medications before your next appointment, please call your pharmacy.

## 2017-11-26 NOTE — Progress Notes (Signed)
HPI Sabrina [name] returns today for ongoing evaluation and management of atrial fib, CHB, s/p PPM insertion. She underwent insertion of a DDD PM with his bundle lead and RV lead followed by AV node ablation. She has done well in the interim. She denies chest pain or sob. No syncope. No palpitations.  Allergies  Allergen Reactions  . Codeine Nausea And Vomiting    Stomach pain also     Current Outpatient Medications  Medication Sig Dispense Refill  . atorvastatin (LIPITOR) 20 MG tablet Take 20 mg by mouth daily.    . budesonide-formoterol (SYMBICORT) 160-4.5 MCG/ACT inhaler Inhale 2 puffs into the lungs 2 (two) times daily as needed (for asthma flares).    . Cyanocobalamin (B-12 PO) Take 1 tablet by mouth daily.    . cyclobenzaprine (FLEXERIL) 5 MG tablet Take 1 tablet (5 mg total) by mouth 3 (three) times daily as needed for muscle spasms. 30 tablet 1  . diltiazem (CARDIZEM CD) 120 MG 24 hr capsule Take 1 capsule (120 mg total) by mouth daily. 30 capsule 3  . DULoxetine (CYMBALTA) 60 MG capsule TAKE 1 CAPSULE (60 MG TOTAL) BY MOUTH DAILY. 30 capsule 1  . ELIQUIS 5 MG TABS tablet TAKE 1 TABLET (5 MG TOTAL) BY MOUTH 2 (TWO) TIMES DAILY. 60 tablet 2  . fluticasone (FLONASE) 50 MCG/ACT nasal spray Place 2 sprays into both nostrils daily. (Patient taking differently: Place 2 sprays into both nostrils daily as needed for allergies or rhinitis. ) 16 g 5  . furosemide (LASIX) 20 MG tablet Take 20-40 mg PRN as needed for swelling and weight gain. 90 tablet 3  . gabapentin (NEURONTIN) 100 MG capsule TAKE 1 CAPSULE BY MOUTH TWICE A DAY 180 capsule 0  . metoprolol tartrate (LOPRESSOR) 25 MG tablet Take 1 tablet (25 mg total) by mouth 2 (two) times daily. 60 tablet 6  . Multiple Vitamins-Minerals (ONE-A-DAY 50 PLUS PO) Take 1 tablet by mouth 2 (two) times daily.    . Omega-3 Fatty Acids (FISH OIL PO) Take 1 capsule by mouth daily.    Marland Kitchen PRESCRIPTION MEDICATION Apply 1 application topically daily as  needed (excema). HAL Ointment    . triamcinolone ointment (KENALOG) 0.5 % Apply 1 application topically 2 (two) times daily. 30 g 0  . Vitamin D, Ergocalciferol, (DRISDOL) 50000 units CAPS capsule TAKE 1 CAPSULE (50,000 UNITS TOTAL) BY MOUTH EVERY 7 (SEVEN) DAYS. 12 capsule 0   No current facility-administered medications for this visit.      Past Medical History:  Diagnosis Date  . Anemia   . Ankle fracture, right    past hx. -"no surgery"  . Anxiety   . Asthma   . CHF (congestive heart failure) ([place]) 2009  . Chronic lower back pain   . Collagen vascular disease ([place])   . COPD (chronic obstructive pulmonary disease) ([place])   . Depression   . Fibromyalgia   . GERD (gastroesophageal reflux disease)   . Hyperlipidemia   . Hypertension   . Immature cataract of both eyes   . Myocardial infarction ([place])    "I've had a light one; don't know when it happened" (08/27/2017)  . Osteoarthritis   . Peripheral neuropathy    legs and feet  . Persistent atrial fibrillation ([place])   . Tubular adenoma of colon     ROS:   All systems reviewed and negative except as noted in the HPI.   Past Surgical History:  Procedure Laterality Date  .  APPENDECTOMY    . AV NODE ABLATION N/A 08/27/2017   Procedure: AV NODE ABLATION;  Surgeon: Evans Lance, MD;  Location: [facility];  Service: Cardiovascular;  Laterality: N/A;  . CHOLECYSTECTOMY OPEN  1978  . DILATION AND CURETTAGE OF UTERUS    . FEMUR FRACTURE SURGERY Left 2013   "put 7" rod in it"  . FRACTURE SURGERY    . LAPAROSCOPY  08/22/2016   Procedure: LAPAROSCOPY DIAGNOSTIC;  Surgeon: [name], MD;  Location: [facility];  Service: General;;  . MEDIAL PARTIAL KNEE REPLACEMENT Right 2005   "@ [facility]"  . PACEMAKER IMPLANT N/A 08/27/2017   Procedure: PACEMAKER IMPLANT;  Surgeon: Evans Lance, MD;  Location: [facility];  Service: Cardiovascular;  Laterality: N/A;  . ROUX-EN-Y GASTRIC BYPASS  2002   [place]    . SPLENECTOMY  2002  . TONSILLECTOMY  1944  . TUBAL LIGATION    . VAGINAL HYSTERECTOMY       Family History  Problem Relation Age of Onset  . Cancer Mother   . Alzheimer's disease Mother   . Arthritis Father   . Colon cancer Paternal Uncle 55     Social History   Socioeconomic History  . Marital status: Divorced    Spouse name: Not on file  . Number of children: Not on file  . Years of education: Not on file  . Highest education level: Not on file  Social Needs  . Financial resource strain: Not on file  . Food insecurity - worry: Not on file  . Food insecurity - inability: Not on file  . Transportation needs - medical: Not on file  . Transportation needs - non-medical: Not on file  Occupational History  . Not on file  Tobacco Use  . Smoking status: Former Smoker    Packs/day: 0.50    Years: 25.00    Pack years: 12.50    Types: Cigarettes    Last attempt to quit: 02/26/1993    Years since quitting: 24.7  . Smokeless tobacco: Never Used  Substance and Sexual Activity  . Alcohol use: No    Comment: 08/27/2017 "nothing since early 2000s"  . Drug use: No  . Sexual activity: Not Currently    Birth control/protection: Surgical  Other Topics Concern  . Not on file  Social History Narrative  . Not on file     BP 110/60   Pulse 75   Ht 5\' 4"  (1.626 m)   Wt 196 lb (88.9 kg)   BMI 33.64 kg/m   Physical Exam:  Well appearing 72 yo woman, NAD HEENT: Unremarkable Neck:  No JVD, no thyromegally Lymphatics:  No adenopathy Back:  No CVA tenderness Lungs:  Clear with no wheezes HEART:  Regular rate rhythm, no murmurs, no rubs, no clicks Abd:  soft, positive bowel sounds, no organomegally, no rebound, no guarding Ext:  2 plus pulses, no edema, no cyanosis, no clubbing Skin:  No rashes no nodules Neuro:  CN II through XII intact, motor grossly intact  EKG - atrial fib with ventricular/his bundle pacing  DEVICE  Normal device function.  See PaceArt for details.    Assess/Plan: 1. Atrial fib - her ventricular rate is now well controlled. She will continue her current meds. 2. PPM - interogation of her device demonstrates normal function. Her his bundle lead threshold is up a bit (for his capture) but still acceptable. We have reprogrammed her DDIR and taken her AV delay  out so that she will not pace in the RV channel. 3. Chronic diastolic heart failure - her symptoms are class 2 but she is overall improved after PPM and AV node ablation. 4. HTN - her blood pressure is well controlled. Will follow.  Mikle Bosworth.D.

## 2017-12-03 ENCOUNTER — Other Ambulatory Visit: Payer: Self-pay | Admitting: Cardiology

## 2017-12-18 LAB — CUP PACEART INCLINIC DEVICE CHECK
Battery Remaining Longevity: 48 mo
Battery Voltage: 2.97 V
Brady Statistic AP VP Percent: 99.9 %
Brady Statistic RA Percent Paced: 99.98 %
Brady Statistic RV Percent Paced: 99.9 %
Date Time Interrogation Session: 20190307145650
Implantable Lead Location: 753860
Implantable Lead Model: 3830
Implantable Lead Model: 5076
Implantable Pulse Generator Implant Date: 20181206
Lead Channel Impedance Value: 285 Ohm
Lead Channel Impedance Value: 513 Ohm
Lead Channel Pacing Threshold Amplitude: 0.75 V
Lead Channel Pacing Threshold Pulse Width: 1 ms
Lead Channel Sensing Intrinsic Amplitude: 4.125 mV
Lead Channel Sensing Intrinsic Amplitude: 6 mV
Lead Channel Setting Pacing Amplitude: 4 V
Lead Channel Setting Pacing Pulse Width: 0.4 ms
Lead Channel Setting Sensing Sensitivity: 2 mV
MDC IDC LEAD IMPLANT DT: 20181206
MDC IDC LEAD IMPLANT DT: 20181206
MDC IDC LEAD LOCATION: 753860
MDC IDC MSMT LEADCHNL RA IMPEDANCE VALUE: 380 Ohm
MDC IDC MSMT LEADCHNL RA PACING THRESHOLD AMPLITUDE: 1.5 V
MDC IDC MSMT LEADCHNL RV IMPEDANCE VALUE: 399 Ohm
MDC IDC MSMT LEADCHNL RV PACING THRESHOLD PULSEWIDTH: 0.4 ms
MDC IDC SET LEADCHNL RV PACING AMPLITUDE: 2 V
MDC IDC STAT BRADY AP VS PERCENT: 0.07 %
MDC IDC STAT BRADY AS VP PERCENT: 0 %
MDC IDC STAT BRADY AS VS PERCENT: 0.04 %

## 2018-01-07 ENCOUNTER — Other Ambulatory Visit: Payer: Self-pay | Admitting: Family

## 2018-01-07 ENCOUNTER — Other Ambulatory Visit: Payer: Self-pay | Admitting: Nurse Practitioner

## 2018-01-07 DIAGNOSIS — F411 Generalized anxiety disorder: Secondary | ICD-10-CM

## 2018-01-07 DIAGNOSIS — M26609 Unspecified temporomandibular joint disorder, unspecified side: Secondary | ICD-10-CM

## 2018-01-07 NOTE — Telephone Encounter (Signed)
Last seen 10/14/16

## 2018-01-22 ENCOUNTER — Other Ambulatory Visit: Payer: Self-pay | Admitting: Physician Assistant

## 2018-01-22 NOTE — Telephone Encounter (Signed)
This is a [place] pt °

## 2018-01-28 ENCOUNTER — Other Ambulatory Visit: Payer: Self-pay | Admitting: Cardiovascular Disease

## 2018-02-09 ENCOUNTER — Other Ambulatory Visit: Payer: Self-pay | Admitting: Family

## 2018-02-09 NOTE — Telephone Encounter (Signed)
Last Vit D 10/19/15  14.5

## 2018-02-24 ENCOUNTER — Other Ambulatory Visit: Payer: Self-pay | Admitting: Nurse Practitioner

## 2018-02-25 ENCOUNTER — Telehealth: Payer: Self-pay | Admitting: Cardiology

## 2018-02-25 ENCOUNTER — Ambulatory Visit (INDEPENDENT_AMBULATORY_CARE_PROVIDER_SITE_OTHER): Payer: Medicare HMO | Admitting: *Deleted

## 2018-02-25 DIAGNOSIS — I442 Atrioventricular block, complete: Secondary | ICD-10-CM | POA: Diagnosis not present

## 2018-02-25 NOTE — Telephone Encounter (Signed)
Attempted to confirm remote transmission with pt. No answer and was unable to leave a message.   

## 2018-02-26 NOTE — Progress Notes (Signed)
Remote pacemaker transmission.   

## 2018-03-04 ENCOUNTER — Ambulatory Visit (INDEPENDENT_AMBULATORY_CARE_PROVIDER_SITE_OTHER): Payer: Medicare HMO | Admitting: Family

## 2018-03-04 ENCOUNTER — Encounter: Payer: Self-pay | Admitting: Family

## 2018-03-04 VITALS — BP 101/77 | HR 71 | Temp 97.4°F | Ht 64.0 in | Wt 191.0 lb

## 2018-03-04 DIAGNOSIS — F419 Anxiety disorder, unspecified: Secondary | ICD-10-CM

## 2018-03-04 DIAGNOSIS — J4520 Mild intermittent asthma, uncomplicated: Secondary | ICD-10-CM

## 2018-03-04 DIAGNOSIS — J069 Acute upper respiratory infection, unspecified: Secondary | ICD-10-CM

## 2018-03-04 DIAGNOSIS — G4700 Insomnia, unspecified: Secondary | ICD-10-CM | POA: Diagnosis not present

## 2018-03-04 DIAGNOSIS — F331 Major depressive disorder, recurrent, moderate: Secondary | ICD-10-CM

## 2018-03-04 DIAGNOSIS — D509 Iron deficiency anemia, unspecified: Secondary | ICD-10-CM

## 2018-03-04 DIAGNOSIS — F411 Generalized anxiety disorder: Secondary | ICD-10-CM | POA: Diagnosis not present

## 2018-03-04 DIAGNOSIS — E559 Vitamin D deficiency, unspecified: Secondary | ICD-10-CM | POA: Diagnosis not present

## 2018-03-04 DIAGNOSIS — E782 Mixed hyperlipidemia: Secondary | ICD-10-CM

## 2018-03-04 DIAGNOSIS — R6889 Other general symptoms and signs: Secondary | ICD-10-CM | POA: Diagnosis not present

## 2018-03-04 DIAGNOSIS — Z1212 Encounter for screening for malignant neoplasm of rectum: Secondary | ICD-10-CM

## 2018-03-04 DIAGNOSIS — I482 Chronic atrial fibrillation, unspecified: Secondary | ICD-10-CM

## 2018-03-04 DIAGNOSIS — I5032 Chronic diastolic (congestive) heart failure: Secondary | ICD-10-CM | POA: Diagnosis not present

## 2018-03-04 DIAGNOSIS — K219 Gastro-esophageal reflux disease without esophagitis: Secondary | ICD-10-CM

## 2018-03-04 DIAGNOSIS — Z1211 Encounter for screening for malignant neoplasm of colon: Secondary | ICD-10-CM

## 2018-03-04 MED ORDER — MUPIROCIN 2 % EX OINT: 1.0000 "application " | TOPICAL_OINTMENT | Freq: Two times a day (BID) | CUTANEOUS | 0 refills | Status: DC

## 2018-03-04 MED ORDER — FLUTICASONE PROPIONATE 50 MCG/ACT NA SUSP: 2.0000 | Freq: Every day | NASAL | 6 refills | Status: DC

## 2018-03-04 MED ORDER — ALBUTEROL SULFATE HFA 108 (90 BASE) MCG/ACT IN AERS: 2.0000 | INHALATION_SPRAY | Freq: Four times a day (QID) | RESPIRATORY_TRACT | 2 refills | Status: DC | PRN

## 2018-03-04 NOTE — Progress Notes (Signed)
Subjective:    Patient ID: Sabrina [name], female    DOB: Oct 19, 1945, 72 y.o.   MRN: [id number]  Chief Complaint  Patient presents with  . Medical Management of Chronic Issues    lab work and refills due   Pt presents to the office today for chronic follow up. Pt is followed by Cardiologists every 4 months for A Fib and CHF.   PT states she never started her Lipitor, Cardizem,  or Vit D rx. Asthma  She complains of shortness of breath. There is no cough, hoarse voice or sputum production. This is a chronic problem. The current episode started more than 1 year ago. The problem occurs intermittently. Associated symptoms include heartburn and malaise/fatigue. Pertinent negatives include no ear congestion, ear pain, fever, headaches or sore throat. Her past medical history is significant for asthma.  Hypertension  This is a chronic problem. The current episode started more than 1 year ago. The problem has been resolved since onset. The problem is controlled. Associated symptoms include anxiety, malaise/fatigue and shortness of breath. Pertinent negatives include no headaches or peripheral edema. Hypertensive end-organ damage includes CAD/MI. There is no history of kidney disease.  Anemia  Presents for follow-up visit. Symptoms include malaise/fatigue. There has been no bruising/bleeding easily or fever.  Hyperlipidemia  This is a chronic problem. The current episode started more than 1 year ago. The problem is uncontrolled. Recent lipid tests were reviewed and are high. Exacerbating diseases include obesity. Associated symptoms include shortness of breath. She is currently on no antihyperlipidemic treatment. The current treatment provides no improvement of lipids. Risk factors for coronary artery disease include dyslipidemia, hypertension, a sedentary lifestyle and post-menopausal.  Anxiety  Presents for follow-up visit. Symptoms include excessive worry, insomnia, irritability, nervous/anxious  behavior, restlessness and shortness of breath. Patient reports no depressed mood. Symptoms occur most days. The severity of symptoms is moderate.   Her past medical history is significant for anemia and asthma.  Insomnia  Primary symptoms: difficulty falling asleep, premature morning awakening, malaise/fatigue.  The current episode started more than one year. The problem occurs intermittently. The problem has been waxing and waning since onset.  Gastroesophageal Reflux  She complains of heartburn. She reports no belching, no coughing, no hoarse voice or no sore throat. This is a chronic problem. The current episode started more than 1 year ago. The problem occurs occasionally. The problem has been waxing and waning. The symptoms are aggravated by certain foods. She has tried a diet change for the symptoms. The treatment provided moderate relief.      Review of Systems  Constitutional: Positive for irritability and malaise/fatigue. Negative for fever.  HENT: Negative for ear pain, hoarse voice and sore throat.   Respiratory: Positive for shortness of breath. Negative for cough and sputum production.   Gastrointestinal: Positive for heartburn.  Neurological: Negative for headaches.  Hematological: Does not bruise/bleed easily.  Psychiatric/Behavioral: The patient is nervous/anxious and has insomnia.   All other systems reviewed and are negative.      Objective:   Physical Exam  Constitutional: She is oriented to person, place, and time. She appears well-developed and well-nourished. No distress.  HENT:  Head: Normocephalic and atraumatic.  Right Ear: External ear normal.  Left Ear: External ear normal.  Mouth/Throat: Dental caries present. Posterior oropharyngeal erythema present.  Eyes: Pupils are equal, round, and reactive to light.  Neck: Normal range of motion. Neck supple. No thyromegaly present.  Cardiovascular: Normal rate, regular rhythm, normal heart  sounds and intact distal  pulses.  No murmur heard. Pulmonary/Chest: Effort normal and breath sounds normal. No respiratory distress. She has no wheezes.  Abdominal: Soft. Bowel sounds are normal. She exhibits no distension. There is no tenderness.  Musculoskeletal: Normal range of motion. She exhibits no edema or tenderness.  Neurological: She is alert and oriented to person, place, and time. She has normal reflexes. No cranial nerve deficit.  Skin: Skin is warm and dry.  Psychiatric: She has a normal mood and affect. Her behavior is normal. Judgment and thought content normal.  Vitals reviewed.     BP 101/77   Pulse 71   Temp (!) 97.4 F (36.3 C) (Oral)   Ht 5' 4" (1.626 m)   Wt 191 lb (86.6 kg)   BMI 32.79 kg/m      Assessment & Plan:  [name] comes in today with chief complaint of Medical Management of Chronic Issues (lab work and refills due)   Diagnosis and orders addressed:  1. Anxiety - CMP14+EGFR  2. Mixed hyperlipidemia - CMP14+EGFR - Lipid panel  3. Chronic atrial fibrillation (HCC) - CMP14+EGFR  4. Gastroesophageal reflux disease, esophagitis presence not specified - CMP14+EGFR  5. Iron deficiency anemia, unspecified iron deficiency anemia type - Anemia Profile B - CMP14+EGFR  6. Insomnia, unspecified type - CMP14+EGFR  7. Moderate episode of recurrent major depressive disorder (HCC) - CMP14+EGFR  8. Generalized anxiety disorder - CMP14+EGFR  9. Mild intermittent asthma without complication  - CMP14+EGFR  10. Vitamin D deficiency - CMP14+EGFR - VITAMIN D 25 Hydroxy (Vit-D Deficiency, Fractures)  11. Chronic diastolic heart failure (HCC)  - CMP14+EGFR  12. Morbid obesity (HCC)  - CMP14+EGFR  13. Colon cancer screening - Cologuard - CMP14+EGFR  14. Screening for malignant neoplasm of the rectum  - Cologuard - CMP14+EGFR  15. Viral upper respiratory tract infection - fluticasone (FLONASE) 50 MCG/ACT nasal spray; Place 2 sprays into both  nostrils daily.  Dispense: 16 g; Refill: 6   Labs pending Health Maintenance reviewed- Pt to schedule mammogram  Diet and exercise encouraged  Follow up plan: 3 months    , FNP  

## 2018-03-04 NOTE — Patient Instructions (Signed)
Cholesterol Cholesterol is a fat. Your body needs a small amount of cholesterol. Cholesterol (plaque) may build up in your blood vessels (arteries). That makes you more likely to have a heart attack or stroke. You cannot feel your cholesterol level. Having a blood test is the only way to find out if your level is high. Keep your test results. Work with your doctor to keep your cholesterol at a good level. What do the results mean?  Total cholesterol is how much cholesterol is in your blood.  LDL is bad cholesterol. This is the type that can build up. Try to have low LDL.  HDL is good cholesterol. It cleans your blood vessels and carries LDL away. Try to have high HDL.  Triglycerides are fat that the body can store or burn for energy. What are good levels of cholesterol?  Total cholesterol below 200.  LDL below 100 is good for people who have health risks. LDL below 70 is good for people who have very high risks.  HDL above 40 is good. It is best to have HDL of 60 or higher.  Triglycerides below 150. How can I lower my cholesterol? Diet Follow your diet program as told by your doctor.  Choose fish, white meat chicken, or turkey that is roasted or baked. Try not to eat red meat, fried foods, sausage, or lunch meats.  Eat lots of fresh fruits and vegetables.  Choose whole grains, beans, pasta, potatoes, and cereals.  Choose olive oil, corn oil, or canola oil. Only use small amounts.  Try not to eat butter, mayonnaise, shortening, or palm kernel oils.  Try not to eat foods with trans fats.  Choose low-fat or nonfat dairy foods. ? Drink skim or nonfat milk. ? Eat low-fat or nonfat yogurt and cheeses. ? Try not to drink whole milk or cream. ? Try not to eat ice cream, egg yolks, or full-fat cheeses.  Healthy desserts include angel food cake, ginger snaps, animal crackers, hard candy, popsicles, and low-fat or nonfat frozen yogurt. Try not to eat pastries, cakes, pies, and  cookies.  Exercise Follow your exercise program as told by your doctor.  Be more active. Try gardening, walking, and taking the stairs.  Ask your doctor about ways that you can be more active.  Medicine  Take over-the-counter and prescription medicines only as told by your doctor. This information is not intended to replace advice given to you by your health care provider. Make sure you discuss any questions you have with your health care provider. Document Released: 12/05/2008 Document Revised: 04/09/2016 Document Reviewed: 03/20/2016 Elsevier Interactive Patient Education  2018 Elsevier Inc.  

## 2018-03-05 ENCOUNTER — Other Ambulatory Visit: Payer: Self-pay | Admitting: Family

## 2018-03-05 LAB — ANEMIA PROFILE B
BASOS ABS: 0.1 10*3/uL (ref 0.0–0.2)
Basos: 1 %
EOS (ABSOLUTE): 0.3 10*3/uL (ref 0.0–0.4)
Eos: 4 %
FERRITIN: 31 ng/mL (ref 15–150)
FOLATE: 12.6 ng/mL (ref >3.0)
Hematocrit: 43.6 % (ref 34.0–46.6)
Hemoglobin: 14.7 g/dL (ref 11.1–15.9)
Immature Grans (Abs): 0 10*3/uL (ref 0.0–0.1)
Immature Granulocytes: 0 %
Iron Saturation: 28 % (ref 15–55)
Iron: 93 ug/dL (ref 27–139)
LYMPHS: 34 %
Lymphocytes Absolute: 2.4 10*3/uL (ref 0.7–3.1)
MCH: 29.9 pg (ref 26.6–33.0)
MCHC: 33.7 g/dL (ref 31.5–35.7)
MCV: 89 fL (ref 79–97)
MONOCYTES: 6 %
Monocytes Absolute: 0.4 10*3/uL (ref 0.1–0.9)
NEUTROS ABS: 4 10*3/uL (ref 1.4–7.0)
Neutrophils: 55 %
PLATELETS: 336 10*3/uL (ref 150–450)
RBC: 4.92 x10E6/uL (ref 3.77–5.28)
RDW: 13.8 % (ref 12.3–15.4)
RETIC CT PCT: 1.4 % (ref 0.6–2.6)
Total Iron Binding Capacity: 336 ug/dL (ref 250–450)
UIBC: 243 ug/dL (ref 118–369)
VITAMIN B 12: 237 pg/mL (ref 232–1245)
WBC: 7.2 10*3/uL (ref 3.4–10.8)

## 2018-03-05 LAB — CMP14+EGFR
A/G RATIO: 1.4 (ref 1.2–2.2)
ALT: 12 IU/L (ref 0–32)
AST: 19 IU/L (ref 0–40)
Albumin: 3.7 g/dL (ref 3.5–4.8)
Alkaline Phosphatase: 132 IU/L — ABNORMAL HIGH (ref 39–117)
BILIRUBIN TOTAL: 0.5 mg/dL (ref 0.0–1.2)
BUN/Creatinine Ratio: 17 (ref 12–28)
BUN: 15 mg/dL (ref 8–27)
CHLORIDE: 97 mmol/L (ref 96–106)
CO2: 28 mmol/L (ref 20–29)
Calcium: 8.8 mg/dL (ref 8.7–10.3)
Creatinine, Ser: 0.9 mg/dL (ref 0.57–1.00)
GFR calc Af Amer: 75 mL/min/{1.73_m2} (ref >59)
GFR calc non Af Amer: 65 mL/min/{1.73_m2} (ref >59)
GLUCOSE: 121 mg/dL — AB (ref 65–99)
Globulin, Total: 2.7 g/dL (ref 1.5–4.5)
POTASSIUM: 3.6 mmol/L (ref 3.5–5.2)
Sodium: 139 mmol/L (ref 134–144)
TOTAL PROTEIN: 6.4 g/dL (ref 6.0–8.5)

## 2018-03-05 LAB — LIPID PANEL
CHOLESTEROL TOTAL: 216 mg/dL — AB (ref 100–199)
Chol/HDL Ratio: 3.8 ratio (ref 0.0–4.4)
HDL: 57 mg/dL (ref >39)
LDL Calculated: 122 mg/dL — ABNORMAL HIGH (ref 0–99)
Triglycerides: 186 mg/dL — ABNORMAL HIGH (ref 0–149)
VLDL CHOLESTEROL CAL: 37 mg/dL (ref 5–40)

## 2018-03-05 LAB — VITAMIN D 25 HYDROXY (VIT D DEFICIENCY, FRACTURES): VIT D 25 HYDROXY: 27.1 ng/mL — AB (ref 30.0–100.0)

## 2018-03-05 MED ORDER — ATORVASTATIN CALCIUM 20 MG PO TABS: 20.0000 mg | ORAL_TABLET | Freq: Every day | ORAL | 3 refills | Status: DC

## 2018-03-05 MED ORDER — VITAMIN D (ERGOCALCIFEROL) 1.25 MG (50000 UNIT) PO CAPS: ORAL_CAPSULE | ORAL | 1 refills | Status: DC

## 2018-03-05 NOTE — Addendum Note (Signed)
Addended byCarrolyn Leigh on: 03/05/2018 01:54 PM   Modules accepted: Orders

## 2018-03-19 ENCOUNTER — Telehealth: Payer: Self-pay

## 2018-03-19 ENCOUNTER — Other Ambulatory Visit: Payer: Self-pay | Admitting: Cardiovascular Disease

## 2018-03-19 DIAGNOSIS — I10 Essential (primary) hypertension: Secondary | ICD-10-CM

## 2018-03-19 MED ORDER — FUROSEMIDE 40 MG PO TABS: 40.0000 mg | ORAL_TABLET | Freq: Every day | ORAL | 1 refills | Status: DC

## 2018-03-19 MED ORDER — POTASSIUM CHLORIDE CRYS ER 20 MEQ PO TBCR: 20.0000 meq | EXTENDED_RELEASE_TABLET | Freq: Every day | ORAL | 1 refills | Status: DC

## 2018-03-19 NOTE — Telephone Encounter (Signed)
Change her prescription to 40 mg daily with 20 meq KCl daily. Check a BMET if not done so recently by PCP.

## 2018-03-19 NOTE — Telephone Encounter (Signed)
RX sent BMET ordered

## 2018-03-19 NOTE — Telephone Encounter (Signed)
Patient contacted office stating she needed a refill on lasix. Patient was given 90 tablets with 3 refills on 10/12/17. Instructions were 1-2 tablets as needed for swelling or weight gain. Patient reports she has been taking 2-3 tablets every day due to swelling. Advised patient I would need to send this to Dr. Bronson Ing. Patient does have an appointment on 06/01/18.

## 2018-03-23 NOTE — Telephone Encounter (Signed)
Reviewed. Ok.

## 2018-03-23 NOTE — Telephone Encounter (Signed)
Patient had lab work done 2 weeks ago with PCP. In epic

## 2018-04-08 ENCOUNTER — Other Ambulatory Visit: Payer: Self-pay | Admitting: Family

## 2018-04-08 DIAGNOSIS — M26609 Unspecified temporomandibular joint disorder, unspecified side: Secondary | ICD-10-CM

## 2018-04-14 LAB — CUP PACEART REMOTE DEVICE CHECK
Battery Remaining Longevity: 50 mo
Battery Voltage: 2.94 V
Brady Statistic AP VP Percent: 0.38 %
Brady Statistic AP VS Percent: 99.58 %
Brady Statistic AS VP Percent: 0 %
Brady Statistic AS VS Percent: 0.04 %
Brady Statistic RA Percent Paced: 99.97 %
Date Time Interrogation Session: 20190607125241
Implantable Lead Implant Date: 20181206
Implantable Lead Implant Date: 20181206
Implantable Lead Location: 753860
Implantable Lead Location: 753860
Implantable Pulse Generator Implant Date: 20181206
Lead Channel Impedance Value: 304 Ohm
Lead Channel Impedance Value: 399 Ohm
Lead Channel Impedance Value: 646 Ohm
Lead Channel Sensing Intrinsic Amplitude: 4.125 mV
Lead Channel Sensing Intrinsic Amplitude: 6 mV
Lead Channel Sensing Intrinsic Amplitude: 9.625 mV
Lead Channel Setting Pacing Amplitude: 2 V
Lead Channel Setting Pacing Amplitude: 4 V
Lead Channel Setting Pacing Pulse Width: 0.4 ms
Lead Channel Setting Sensing Sensitivity: 2 mV
MDC IDC MSMT LEADCHNL RV IMPEDANCE VALUE: 418 Ohm
MDC IDC STAT BRADY RV PERCENT PACED: 0.38 %

## 2018-04-17 ENCOUNTER — Other Ambulatory Visit: Payer: Self-pay | Admitting: Family

## 2018-04-17 DIAGNOSIS — F411 Generalized anxiety disorder: Secondary | ICD-10-CM

## 2018-04-27 DIAGNOSIS — H43813 Vitreous degeneration, bilateral: Secondary | ICD-10-CM | POA: Diagnosis not present

## 2018-04-27 DIAGNOSIS — H2513 Age-related nuclear cataract, bilateral: Secondary | ICD-10-CM | POA: Diagnosis not present

## 2018-04-27 DIAGNOSIS — H5203 Hypermetropia, bilateral: Secondary | ICD-10-CM | POA: Diagnosis not present

## 2018-04-27 DIAGNOSIS — H43393 Other vitreous opacities, bilateral: Secondary | ICD-10-CM | POA: Diagnosis not present

## 2018-04-27 DIAGNOSIS — Z0100 Encounter for examination of eyes and vision without abnormal findings: Secondary | ICD-10-CM | POA: Diagnosis not present

## 2018-05-27 ENCOUNTER — Ambulatory Visit (INDEPENDENT_AMBULATORY_CARE_PROVIDER_SITE_OTHER): Payer: Medicare HMO | Admitting: *Deleted

## 2018-05-27 DIAGNOSIS — I442 Atrioventricular block, complete: Secondary | ICD-10-CM | POA: Diagnosis not present

## 2018-05-27 NOTE — Progress Notes (Signed)
Remote pacemaker transmission.   

## 2018-05-30 ENCOUNTER — Other Ambulatory Visit: Payer: Self-pay | Admitting: Family

## 2018-06-01 ENCOUNTER — Ambulatory Visit (INDEPENDENT_AMBULATORY_CARE_PROVIDER_SITE_OTHER): Payer: Medicare HMO | Admitting: Cardiovascular Disease

## 2018-06-01 ENCOUNTER — Encounter: Payer: Self-pay | Admitting: Cardiovascular Disease

## 2018-06-01 VITALS — BP 100/78 | HR 76 | Ht 64.0 in | Wt 189.0 lb

## 2018-06-01 DIAGNOSIS — I4819 Other persistent atrial fibrillation: Secondary | ICD-10-CM

## 2018-06-01 DIAGNOSIS — I481 Persistent atrial fibrillation: Secondary | ICD-10-CM | POA: Diagnosis not present

## 2018-06-01 DIAGNOSIS — I5032 Chronic diastolic (congestive) heart failure: Secondary | ICD-10-CM | POA: Diagnosis not present

## 2018-06-01 DIAGNOSIS — Z950 Presence of cardiac pacemaker: Secondary | ICD-10-CM | POA: Diagnosis not present

## 2018-06-01 DIAGNOSIS — I10 Essential (primary) hypertension: Secondary | ICD-10-CM | POA: Diagnosis not present

## 2018-06-01 NOTE — Patient Instructions (Signed)
Medication Instructions:  Continue all current medications.  Labwork: none  Testing/Procedures: none  Follow-Up: May 2020 - Your physician wants you to follow up in: 8 months.  You will receive a reminder letter in the mail one-two months in advance.  If you don't receive a letter, please call our office to schedule the follow up appointment   Any Other Special Instructions Will Be Listed Below (If Applicable).  If you need a refill on your cardiac medications before your next appointment, please call your pharmacy.

## 2018-06-01 NOTE — Progress Notes (Signed)
SUBJECTIVE: Patient presents for routine follow-up.  She has a history of persistent atrial fibrillation and underwent AV node ablation and pacemaker implantation.  She is on Eliquis for anticoagulation.  The patient denies any symptoms of chest pain, palpitations, shortness of breath, lightheadedness, dizziness, leg swelling, orthopnea, PND, and syncope.  She has started doing yoga.    Review of Systems: As per "subjective", otherwise negative.  Allergies  Allergen Reactions  . Codeine Nausea And Vomiting    Stomach pain also    Current Outpatient Medications  Medication Sig Dispense Refill  . atorvastatin (LIPITOR) 20 MG tablet Take 20 mg by mouth daily.    Marland Kitchen atorvastatin (LIPITOR) 20 MG tablet Take 1 tablet (20 mg total) by mouth daily. 90 tablet 3  . calcium carbonate (OSCAL) 1500 (600 Ca) MG TABS tablet Take by mouth daily with breakfast.    . DULoxetine (CYMBALTA) 60 MG capsule TAKE 1 CAPSULE BY MOUTH EVERY DAY 90 capsule 0  . ELIQUIS 5 MG TABS tablet TAKE 1 TABLET (5 MG TOTAL) BY MOUTH 2 (TWO) TIMES DAILY. 60 tablet 5  . fluticasone (FLONASE) 50 MCG/ACT nasal spray Place 2 sprays into both nostrils daily. 16 g 6  . furosemide (LASIX) 40 MG tablet Take 1 tablet (40 mg total) by mouth daily. 90 tablet 1  . gabapentin (NEURONTIN) 100 MG capsule TAKE 1 CAPSULE BY MOUTH TWICE A DAY 180 capsule 0  . metoprolol tartrate (LOPRESSOR) 25 MG tablet TAKE 1 TABLET BY MOUTH THREE TIMES A DAY 90 tablet 3  . Multiple Vitamins-Minerals (ONE-A-DAY 50 PLUS PO) Take 1 tablet by mouth 2 (two) times daily.    . Omega-3 Fatty Acids (FISH OIL PO) Take 1 capsule by mouth daily.    . potassium chloride SA (K-DUR,KLOR-CON) 20 MEQ tablet Take 1 tablet (20 mEq total) by mouth daily. 90 tablet 1  . PRESCRIPTION MEDICATION Apply 1 application topically daily as needed (excema). HAL Ointment    . triamcinolone ointment (KENALOG) 0.5 % Apply 1 application topically 2 (two) times daily. 30 g 0  .  VENTOLIN HFA 108 (90 Base) MCG/ACT inhaler TAKE 2 PUFFS BY MOUTH EVERY 6 HOURS AS NEEDED FOR WHEEZE OR SHORTNESS OF BREATH 18 Inhaler 2  . Vitamin D, Ergocalciferol, (DRISDOL) 50000 units CAPS capsule TAKE 1 CAPSULE (50,000 UNITS TOTAL) BY MOUTH EVERY 7 (SEVEN) DAYS. 12 capsule 1   No current facility-administered medications for this visit.     Past Medical History:  Diagnosis Date  . Anemia   . Ankle fracture, right    past hx. -"no surgery"  . Anxiety   . Asthma   . CHF (congestive heart failure) ([place]) 2009  . Chronic lower back pain   . Collagen vascular disease ([place])   . COPD (chronic obstructive pulmonary disease) ([place])   . Depression   . Fibromyalgia   . GERD (gastroesophageal reflux disease)   . Hyperlipidemia   . Hypertension   . Immature cataract of both eyes   . Myocardial infarction ([place])    "I've had a light one; don't know when it happened" (08/27/2017)  . Osteoarthritis   . Peripheral neuropathy    legs and feet  . Persistent atrial fibrillation ([place])   . Tubular adenoma of colon     Past Surgical History:  Procedure Laterality Date  . APPENDECTOMY    . AV NODE ABLATION N/A 08/27/2017   Procedure: AV NODE ABLATION;  Surgeon: Evans Lance, MD;  Location: [facility]  INVASIVE CV LAB;  Service: Cardiovascular;  Laterality: N/A;  . CHOLECYSTECTOMY OPEN  1978  . DILATION AND CURETTAGE OF UTERUS    . FEMUR FRACTURE SURGERY Left 2013   "put 7" rod in it"  . FRACTURE SURGERY    . LAPAROSCOPY  08/22/2016   Procedure: LAPAROSCOPY DIAGNOSTIC;  Surgeon: [name], MD;  Location: [facility] ORS;  Service: General;;  . MEDIAL PARTIAL KNEE REPLACEMENT Right 2005   "@ [facility]"  . PACEMAKER IMPLANT N/A 08/27/2017   Procedure: PACEMAKER IMPLANT;  Surgeon: Evans Lance, MD;  Location: [facility];  Service: Cardiovascular;  Laterality: N/A;  . ROUX-EN-Y GASTRIC BYPASS  2002   [place]  . SPLENECTOMY  2002  . TONSILLECTOMY  1944  . TUBAL LIGATION    .  VAGINAL HYSTERECTOMY      Social History   Socioeconomic History  . Marital status: Divorced    Spouse name: Not on file  . Number of children: Not on file  . Years of education: Not on file  . Highest education level: Not on file  Occupational History  . Not on file  Social Needs  . Financial resource strain: Not on file  . Food insecurity:    Worry: Not on file    Inability: Not on file  . Transportation needs:    Medical: Not on file    Non-medical: Not on file  Tobacco Use  . Smoking status: Former Smoker    Packs/day: 0.50    Years: 25.00    Pack years: 12.50    Types: Cigarettes    Last attempt to quit: 02/26/1993    Years since quitting: 25.2  . Smokeless tobacco: Never Used  Substance and Sexual Activity  . Alcohol use: No    Comment: 08/27/2017 "nothing since early 2000s"  . Drug use: No  . Sexual activity: Not Currently    Birth control/protection: Surgical  Lifestyle  . Physical activity:    Days per week: Not on file    Minutes per session: Not on file  . Stress: Not on file  Relationships  . Social connections:    Talks on phone: Not on file    Gets together: Not on file    Attends religious service: Not on file    Active member of club or organization: Not on file    Attends meetings of clubs or organizations: Not on file    Relationship status: Not on file  . Intimate partner violence:    Fear of current or ex partner: Not on file    Emotionally abused: Not on file    Physically abused: Not on file    Forced sexual activity: Not on file  Other Topics Concern  . Not on file  Social History Narrative  . Not on file     Vitals:   06/01/18 1538  BP: 100/78  Pulse: 76  SpO2: 98%  Weight: 189 lb (85.7 kg)  Height: 5\' 4"  (1.626 m)    Wt Readings from Last 3 Encounters:  06/01/18 189 lb (85.7 kg)  03/04/18 191 lb (86.6 kg)  11/26/17 196 lb (88.9 kg)     PHYSICAL EXAM General: NAD HEENT: Normal. Neck: No JVD, no thyromegaly. Lungs:  Clear to auscultation bilaterally with normal respiratory effort. CV: Regular rate and rhythm, normal S1/S2, no S3/S4, no murmur.  Pacemaker site is well-healed. No pretibial or periankle edema.  Abdomen: Soft, nontender, no distention.  Neurologic: Alert and oriented.  Psych: Normal affect. Skin: Normal. Musculoskeletal: No gross deformities.    ECG: Reviewed above under Subjective   Labs: Lab Results  Component Value Date/Time   K 3.6 03/04/2018 03:36 PM   BUN 15 03/04/2018 03:36 PM   CREATININE 0.90 03/04/2018 03:36 PM   CREATININE 0.76 10/03/2014 11:46 AM   ALT 12 03/04/2018 03:36 PM   TSH 5.181 (H) 08/21/2017 11:17 PM   TSH 2.590 09/25/2016 12:10 PM   HGB 14.7 03/04/2018 03:36 PM     Lipids: Lab Results  Component Value Date/Time   LDLCALC 122 (H) 03/04/2018 03:36 PM   CHOL 216 (H) 03/04/2018 03:36 PM   TRIG 186 (H) 03/04/2018 03:36 PM   HDL 57 03/04/2018 03:36 PM       ASSESSMENT AND PLAN: 1.  Persistent atrial fibrillation status post AV node ablation and pacemaker placement: Symptomatically stable.  Currently on metoprolol tartrate 25 mg 3 times daily.  I would consider switching her to metoprolol succinate for feasibility.  Anticoagulated with Eliquis 5 mg twice daily.  Device interrogation on 04/14/2018 demonstrated normal function.  2.  Chronic diastolic heart failure: Symptomatically stable.  Continue Lasix 40 mg daily and supplemental potassium.  3.  Hypertension: Controlled on present therapy.  No changes.   Disposition: Follow up with Dr. Lovena Le as scheduled.  Follow-up with me in May 2020.   Kate Sable, M.D., F.A.C.C.

## 2018-06-06 ENCOUNTER — Other Ambulatory Visit: Payer: Self-pay | Admitting: Internal Medicine

## 2018-06-10 ENCOUNTER — Encounter: Payer: Self-pay | Admitting: Family

## 2018-06-10 ENCOUNTER — Ambulatory Visit (INDEPENDENT_AMBULATORY_CARE_PROVIDER_SITE_OTHER): Payer: Medicare HMO | Admitting: Family

## 2018-06-10 VITALS — BP 99/72 | HR 72 | Temp 97.0°F | Ht 64.0 in | Wt 191.4 lb

## 2018-06-10 DIAGNOSIS — N321 Vesicointestinal fistula: Secondary | ICD-10-CM

## 2018-06-10 DIAGNOSIS — R399 Unspecified symptoms and signs involving the genitourinary system: Secondary | ICD-10-CM

## 2018-06-10 LAB — URINALYSIS, COMPLETE
Bilirubin, UA: NEGATIVE
GLUCOSE, UA: NEGATIVE
KETONES UA: NEGATIVE
NITRITE UA: NEGATIVE
PROTEIN UA: NEGATIVE
RBC, UA: NEGATIVE
SPEC GRAV UA: 1.015 (ref 1.005–1.030)
Urobilinogen, Ur: 0.2 mg/dL (ref 0.2–1.0)
pH, UA: 6 (ref 5.0–7.5)

## 2018-06-10 LAB — MICROSCOPIC EXAMINATION: Epithelial Cells (non renal): >10 /hpf — AB (ref 0–10)

## 2018-06-10 MED ORDER — SULFAMETHOXAZOLE-TRIMETHOPRIM 800-160 MG PO TABS: 1.0000 | ORAL_TABLET | Freq: Two times a day (BID) | ORAL | 0 refills | Status: DC

## 2018-06-10 NOTE — Progress Notes (Signed)
   Subjective:    Patient ID: Sabrina [name], female    DOB: [birth year], 72 y.o.   MRN: [id number]  Chief Complaint  Patient presents with  . Urinary Tract Infection    pt here today c/o what she thinks is "fistula opening back up" that she had surgically repaired about 9 months ago   PT presents to the office today for UTI symptoms and complaints with her fistula. She states she had her fistula repaired 12/18. However, she states Monday she went to the restroom and felt large amount of pressure and then had hard stool come out of her vaginal. She states she broke out in a "cold sweat". Since then she states she has not had any stool from her vaginal, but having intermittent soft stool that occurs every time she goes to the restroom.  Urinary Tract Infection   This is a new problem. The current episode started 1 to 4 weeks ago. The problem occurs intermittently. The patient is experiencing no pain. Associated symptoms include hesitancy, sweats and urgency. Pertinent negatives include no flank pain, frequency, hematuria, nausea or vomiting. She has tried increased fluids for the symptoms. The treatment provided no relief.      Review of Systems  Gastrointestinal: Negative for nausea and vomiting.  Genitourinary: Positive for hesitancy and urgency. Negative for flank pain, frequency and hematuria.  All other systems reviewed and are negative.      Objective:   Physical Exam  Constitutional: She is oriented to person, place, and time. She appears well-developed and well-nourished. No distress.  HENT:  Head: Normocephalic.  Eyes: Pupils are equal, round, and reactive to light.  Neck: Normal range of motion. Neck supple. No thyromegaly present.  Cardiovascular: Normal rate, regular rhythm, normal heart sounds and intact distal pulses.  No murmur heard. Pulmonary/Chest: Effort normal and breath sounds normal. No respiratory distress. She has no wheezes.  Abdominal: Soft. Bowel sounds  are normal. She exhibits no distension. There is no tenderness.  Musculoskeletal: Normal range of motion. She exhibits no edema or tenderness.  Neurological: She is alert and oriented to person, place, and time. She has normal reflexes. No cranial nerve deficit.  Skin: Skin is warm and dry.  Psychiatric: She has a normal mood and affect. Her behavior is normal. Judgment and thought content normal.  Vitals reviewed.   BP 99/72   Pulse 72   Temp (!) 97 F (36.1 C) (Oral)   Ht 5\' 4"  (1.626 m)   Wt 191 lb 6 oz (86.8 kg)   BMI 32.85 kg/m      Assessment & Plan:  Sabrina [name] comes in today with chief complaint of Urinary Tract Infection (pt here today c/o what she thinks is "fistula opening back up" that she had surgically repaired about 9 months ago)   Diagnosis and orders addressed:  1. UTI symptoms Force fluids RTO prn Culture pending - Urinalysis, Complete - Urine Culture - sulfamethoxazole-trimethoprim (BACTRIM DS) 800-160 MG tablet; Take 1 tablet by mouth 2 (two) times daily.  Dispense: 14 tablet; Refill: 0  2. Colovesical fistula Follow up with surgery - Ambulatory referral to [place], FNP

## 2018-06-10 NOTE — Patient Instructions (Signed)

## 2018-06-10 NOTE — Progress Notes (Signed)
9

## 2018-06-14 LAB — URINE CULTURE

## 2018-06-15 ENCOUNTER — Other Ambulatory Visit: Payer: Self-pay | Admitting: Family

## 2018-06-15 MED ORDER — NITROFURANTOIN MONOHYD MACRO 100 MG PO CAPS: 100.0000 mg | ORAL_CAPSULE | Freq: Two times a day (BID) | ORAL | 0 refills | Status: DC

## 2018-06-21 ENCOUNTER — Other Ambulatory Visit: Payer: Self-pay | Admitting: Cardiovascular Disease

## 2018-06-22 LAB — CUP PACEART REMOTE DEVICE CHECK
Brady Statistic AP VS Percent: 99.68 %
Brady Statistic AS VP Percent: 0 %
Brady Statistic AS VS Percent: 0.07 %
Brady Statistic RV Percent Paced: 0.25 %
Date Time Interrogation Session: 20190905043657
Implantable Lead Implant Date: 20181206
Implantable Lead Implant Date: 20181206
Implantable Lead Location: 753860
Implantable Lead Model: 5076
Lead Channel Impedance Value: 380 Ohm
Lead Channel Impedance Value: 551 Ohm
Lead Channel Sensing Intrinsic Amplitude: 7.25 mV
Lead Channel Sensing Intrinsic Amplitude: 7.25 mV
Lead Channel Setting Sensing Sensitivity: 2 mV
MDC IDC LEAD LOCATION: 753860
MDC IDC MSMT BATTERY REMAINING LONGEVITY: 45 mo
MDC IDC MSMT BATTERY VOLTAGE: 2.91 V
MDC IDC MSMT LEADCHNL RA IMPEDANCE VALUE: 304 Ohm
MDC IDC MSMT LEADCHNL RA IMPEDANCE VALUE: 399 Ohm
MDC IDC MSMT LEADCHNL RA SENSING INTR AMPL: 4.5 mV
MDC IDC MSMT LEADCHNL RA SENSING INTR AMPL: 4.5 mV
MDC IDC PG IMPLANT DT: 20181206
MDC IDC SET LEADCHNL RA PACING AMPLITUDE: 4 V
MDC IDC SET LEADCHNL RV PACING AMPLITUDE: 2 V
MDC IDC SET LEADCHNL RV PACING PULSEWIDTH: 0.4 ms
MDC IDC STAT BRADY AP VP PERCENT: 0.25 %
MDC IDC STAT BRADY RA PERCENT PACED: 99.98 %

## 2018-06-28 ENCOUNTER — Other Ambulatory Visit: Payer: Self-pay | Admitting: *Deleted

## 2018-06-28 MED ORDER — FUROSEMIDE 40 MG PO TABS: 40.0000 mg | ORAL_TABLET | Freq: Every day | ORAL | 2 refills | Status: DC

## 2018-06-29 ENCOUNTER — Ambulatory Visit (INDEPENDENT_AMBULATORY_CARE_PROVIDER_SITE_OTHER): Payer: Medicare HMO | Admitting: Family

## 2018-06-29 ENCOUNTER — Encounter: Payer: Self-pay | Admitting: Family

## 2018-06-29 VITALS — BP 98/68 | HR 70 | Temp 96.8°F | Ht 64.0 in | Wt 184.8 lb

## 2018-06-29 DIAGNOSIS — I4891 Unspecified atrial fibrillation: Secondary | ICD-10-CM

## 2018-06-29 DIAGNOSIS — E559 Vitamin D deficiency, unspecified: Secondary | ICD-10-CM

## 2018-06-29 DIAGNOSIS — J4520 Mild intermittent asthma, uncomplicated: Secondary | ICD-10-CM | POA: Diagnosis not present

## 2018-06-29 DIAGNOSIS — D509 Iron deficiency anemia, unspecified: Secondary | ICD-10-CM

## 2018-06-29 DIAGNOSIS — E669 Obesity, unspecified: Secondary | ICD-10-CM

## 2018-06-29 DIAGNOSIS — I10 Essential (primary) hypertension: Secondary | ICD-10-CM

## 2018-06-29 DIAGNOSIS — E782 Mixed hyperlipidemia: Secondary | ICD-10-CM

## 2018-06-29 DIAGNOSIS — F331 Major depressive disorder, recurrent, moderate: Secondary | ICD-10-CM | POA: Diagnosis not present

## 2018-06-29 DIAGNOSIS — Z23 Encounter for immunization: Secondary | ICD-10-CM | POA: Diagnosis not present

## 2018-06-29 DIAGNOSIS — G4700 Insomnia, unspecified: Secondary | ICD-10-CM | POA: Diagnosis not present

## 2018-06-29 DIAGNOSIS — K219 Gastro-esophageal reflux disease without esophagitis: Secondary | ICD-10-CM

## 2018-06-29 DIAGNOSIS — M797 Fibromyalgia: Secondary | ICD-10-CM

## 2018-06-29 DIAGNOSIS — F411 Generalized anxiety disorder: Secondary | ICD-10-CM

## 2018-06-29 DIAGNOSIS — I5032 Chronic diastolic (congestive) heart failure: Secondary | ICD-10-CM

## 2018-06-29 MED ORDER — METOPROLOL SUCCINATE ER 25 MG PO TB24: 25.0000 mg | ORAL_TABLET | Freq: Every day | ORAL | 3 refills | Status: DC

## 2018-06-29 NOTE — Addendum Note (Signed)
Addended by: Shelbie Ammons on: 06/29/2018 03:11 PM   Modules accepted: Orders

## 2018-06-29 NOTE — Patient Instructions (Signed)
Hypotension Hypotension, commonly called low blood pressure, is when the force of blood pumping through your arteries is too weak. Arteries are blood vessels that carry blood from the heart throughout the body. When blood pressure is too low, you may not get enough blood to your brain or to the rest of your organs. This can cause weakness, light-headedness, rapid heartbeat, and fainting. Depending on the cause and severity, hypotension may be harmless (benign) or cause serious problems (critical). What are the causes? Possible causes of hypotension include:  Blood loss.  Loss of body fluids (dehydration).  Heart problems.  Hormone (endocrine) problems.  Pregnancy.  Severe infection.  Lack of certain nutrients.  Severe allergic reactions (anaphylaxis).  Certain medicines, such as blood pressure medicine or medicines that make the body lose excess fluids (diuretics). Sometimes, hypotension can be caused by not taking medicine as directed, such as taking too much of a certain medicine.  What increases the risk? Certain factors can make you more likely to develop hypotension, including:  Age. Risk increases as you get older.  Conditions that affect the heart or the central nervous system.  Taking certain medicines, such as blood pressure medicine or diuretics.  Being pregnant.  What are the signs or symptoms? Symptoms of this condition may include:  Weakness.  Light-headedness.  Dizziness.  Blurred vision.  Fatigue.  Rapid heartbeat.  Fainting, in severe cases.  How is this diagnosed? This condition is diagnosed based on:  Your medical history.  Your symptoms.  Your blood pressure measurement. Your health care provider will check your blood pressure when you are: ? Lying down. ? Sitting. ? Standing.  A blood pressure reading is recorded as two numbers, such as "120 over 80" (or 120/80). The first ("top") number is called the systolic pressure. It is a  measure of the pressure in your arteries as your heart beats. The second ("bottom") number is called the diastolic pressure. It is a measure of the pressure in your arteries when your heart relaxes between beats. Blood pressure is measured in a unit called mm Hg. Healthy blood pressure for adults is 120/80. If your blood pressure is below 90/60, you may be diagnosed with hypotension. Other information or tests that may be used to diagnose hypotension include:  Your other vital signs, such as your heart rate and temperature.  Blood tests.  Tilt table test. For this test, you will be safely secured to a table that moves you from a lying position to an upright position. Your heart rhythm and blood pressure will be monitored during the test.  How is this treated? Treatment for this condition may include:  Changing your diet. This may involve eating more salt (sodium) or drinking more water.  Taking medicines to raise your blood pressure.  Changing the dosage of certain medicines you are taking that might be lowering your blood pressure.  Wearing compression stockings. These stockings help to prevent blood clots and reduce swelling in your legs.  In some cases, you may need to go to the hospital for:  Fluid replacement. This means you will receive fluids through an IV tube.  Blood replacement. This means you will receive donated blood through an IV tube (transfusion).  Treating an infection or heart problems, if this applies.  Monitoring. You may need to be monitored while medicines that you are taking wear off.  Follow these instructions at home: Eating and drinking   Drink enough fluid to keep your urine clear or pale yellow.    Eat a healthy diet and follow instructions from your health care provider about eating or drinking restrictions. A healthy diet includes: ? Fresh fruits and vegetables. ? Whole grains. ? Lean meats. ? Low-fat dairy products.  Eat extra salt only as  directed. Do not add extra salt to your diet unless your health care provider told you to do that.  Eat frequent, small meals.  Avoid standing up suddenly after eating. Medicines  Take over-the-counter and prescription medicines only as told by your health care provider. ? Follow instructions from your health care provider about changing the dosage of your current medicines, if this applies. ? Do not stop or adjust any of your medicines on your own. General instructions  Wear compression stockings as told by your health care provider.  Get up slowly from lying down or sitting positions. This gives your blood pressure a chance to adjust.  Avoid hot showers and excessive heat as directed by your health care provider.  Return to your normal activities as told by your health care provider. Ask your health care provider what activities are safe for you.  Do not use any products that contain nicotine or tobacco, such as cigarettes and e-cigarettes. If you need help quitting, ask your health care provider.  Keep all follow-up visits as told by your health care provider. This is important. Contact a health care provider if:  You vomit.  You have diarrhea.  You have a fever for more than 2-3 days.  You feel more thirsty than usual.  You feel weak and tired. Get help right away if:  You have chest pain.  You have a fast or irregular heartbeat.  You develop numbness in any part of your body.  You cannot move your arms or your legs.  You have trouble speaking.  You become sweaty or feel light-headed.  You faint.  You feel short of breath.  You have trouble staying awake.  You feel confused. This information is not intended to replace advice given to you by your health care provider. Make sure you discuss any questions you have with your health care provider. Document Released: 09/08/2005 Document Revised: 03/28/2016 Document Reviewed: 02/28/2016 Elsevier Interactive  Patient Education  2018 [employer] Inc.  

## 2018-06-29 NOTE — Progress Notes (Signed)
Subjective:    Patient ID: Sabrina [name], female    DOB: 1945-10-04, 72 y.o.   MRN: [id number]  Chief Complaint  Patient presents with  . Annual Exam   Pt presents to the office today for chronic follow up. Pt is followed by Cardiologists every 4 months for A Fib and CHF.   PT is hypotensive today and states over the last three weeks she has not felt good. She reports when she went to the pharmacy her cardizem 120 mg was there filled and thought her Cardiologists restarted this. I do not see any mention of restarting Cardizem in his notes and believe this was auto refilled.  Hypertension  This is a chronic problem. The current episode started more than 1 year ago. The problem has been resolved since onset. Associated symptoms include anxiety, malaise/fatigue and shortness of breath. Pertinent negatives include no peripheral edema. Risk factors for coronary artery disease include dyslipidemia, family history and sedentary lifestyle. The current treatment provides moderate improvement. Hypertensive end-organ damage includes CAD/MI and heart failure.  Hyperlipidemia  This is a chronic problem. The current episode started more than 1 year ago. The problem is uncontrolled. Recent lipid tests were reviewed and are high. Exacerbating diseases include obesity. Associated symptoms include shortness of breath. Current antihyperlipidemic treatment includes statins. The current treatment provides mild improvement of lipids. Risk factors for coronary artery disease include dyslipidemia, family history, hypertension and a sedentary lifestyle.  Depression         This is a chronic problem.  The current episode started more than 1 year ago.   The onset quality is gradual.   The problem occurs intermittently.  The problem has been waxing and waning since onset.  Associated symptoms include decreased concentration, irritable, restlessness, decreased interest and sad.  Associated symptoms include no  helplessness and no hopelessness.  Past treatments include SSRIs - Selective serotonin reuptake inhibitors.  Past medical history includes anxiety.   Anxiety  Presents for follow-up visit. Symptoms include decreased concentration, excessive worry, irritability, nervous/anxious behavior, restlessness and shortness of breath. Symptoms occur most days. The severity of symptoms is mild.   Her past medical history is significant for asthma.  Asthma  She complains of frequent throat clearing, shortness of breath and wheezing. There is no cough. The current episode started more than 1 year ago. The problem occurs intermittently. The problem has been waxing and waning. Associated symptoms include heartburn and malaise/fatigue. Her past medical history is significant for asthma.  Gastroesophageal Reflux  She complains of heartburn and wheezing. She reports no belching or no coughing. This is a chronic problem. The current episode started more than 1 year ago. The problem occurs occasionally. The symptoms are aggravated by medications. She has tried a PPI for the symptoms. The treatment provided moderate relief.      Review of Systems  Constitutional: Positive for irritability and malaise/fatigue.  Respiratory: Positive for shortness of breath and wheezing. Negative for cough.   Gastrointestinal: Positive for heartburn.  Psychiatric/Behavioral: Positive for decreased concentration and depression. The patient is nervous/anxious.   All other systems reviewed and are negative.      Objective:   Physical Exam  Constitutional: She is oriented to person, place, and time. She appears well-developed and well-nourished. She is irritable. No distress.  HENT:  Head: Normocephalic and atraumatic.  Right Ear: External ear normal.  Left Ear: External ear normal.  Mouth/Throat: Oropharynx is clear and moist.  Eyes: Pupils are equal, round, and reactive  to light.  Neck: Normal range of motion. Neck supple. No  thyromegaly present.  Cardiovascular: Normal rate, regular rhythm, normal heart sounds and intact distal pulses.  No murmur heard. Pulmonary/Chest: Effort normal and breath sounds normal. No respiratory distress. She has no wheezes.  Abdominal: Soft. Bowel sounds are normal. She exhibits no distension. There is no tenderness.  Musculoskeletal: Normal range of motion. She exhibits no edema or tenderness.  Neurological: She is alert and oriented to person, place, and time. She has normal reflexes. No cranial nerve deficit.  Skin: Skin is warm and dry.  Psychiatric: She has a normal mood and affect. Her behavior is normal. Judgment and thought content normal.  Vitals reviewed.     BP 98/68   Pulse 70   Temp (!) 96.8 F (36 C) (Oral)   Ht _0  (1.626 m)   Wt 184 lb 12.8 oz (83.8 kg)   BMI 31.72 kg/m      Assessment & Plan:  Sabrina [name] comes in today with chief complaint of Annual Exam   Diagnosis and orders addressed:  1. Benign essential HTN Will stop Cardizem 120 mg  Will change Metoprolol 25 mg BID to Metoprolol succinate 25 mg XL - metoprolol succinate (TOPROL-XL) 25 MG 24 hr tablet; Take 1 tablet (25 mg total) by mouth daily.  Dispense: 90 tablet; Refill: 3 - CMP14+EGFR - CBC with Differential/Platelet  2. Chronic diastolic heart failure (HCC) - metoprolol succinate (TOPROL-XL) 25 MG 24 hr tablet; Take 1 tablet (25 mg total) by mouth daily.  Dispense: 90 tablet; Refill: 3 - CMP14+EGFR - CBC with Differential/Platelet  3. Atrial fibrillation with RVR (HCC) - CMP14+EGFR - CBC with Differential/Platelet  4. Mild intermittent asthma without complication - SPQ33+AQTM - CBC with Differential/Platelet  5. Insomnia, unspecified type - CMP14+EGFR - CBC with Differential/Platelet  6. Moderate episode of recurrent major depressive disorder (HCC) - CMP14+EGFR - CBC with Differential/Platelet  7. Generalized anxiety disorder - CMP14+EGFR - CBC with  Differential/Platelet  8. Vitamin D deficiency - CMP14+EGFR - CBC with Differential/Platelet  9. Obesity (BMI 30-39.9) - CMP14+EGFR - CBC with Differential/Platelet  10. Gastroesophageal reflux disease, esophagitis presence not specified - CMP14+EGFR - CBC with Differential/Platelet  11. Iron deficiency anemia, unspecified iron deficiency anemia type - CMP14+EGFR - CBC with Differential/Platelet  12. Fibromyalgia - CMP14+EGFR - CBC with Differential/Platelet  13. Mixed hyperlipidemia - CMP14+EGFR - CBC with Differential/Platelet   Labs pending Health Maintenance reviewed- TDAP and Flu vaccine given today Diet and exercise encouraged  Follow up plan:  2 weeks to recheck HTN  Evelina Dun, FNP

## 2018-06-30 LAB — CBC WITH DIFFERENTIAL/PLATELET
BASOS ABS: 0.1 10*3/uL (ref 0.0–0.2)
BASOS: 1 %
EOS (ABSOLUTE): 0.2 10*3/uL (ref 0.0–0.4)
Eos: 3 %
HEMOGLOBIN: 14.8 g/dL (ref 11.1–15.9)
Hematocrit: 43 % (ref 34.0–46.6)
IMMATURE GRANULOCYTES: 0 %
Immature Grans (Abs): 0 10*3/uL (ref 0.0–0.1)
Lymphocytes Absolute: 2.1 10*3/uL (ref 0.7–3.1)
Lymphs: 26 %
MCH: 30.8 pg (ref 26.6–33.0)
MCHC: 34.4 g/dL (ref 31.5–35.7)
MCV: 90 fL (ref 79–97)
MONOCYTES: 6 %
Monocytes Absolute: 0.5 10*3/uL (ref 0.1–0.9)
NEUTROS ABS: 5.2 10*3/uL (ref 1.4–7.0)
NEUTROS PCT: 64 %
PLATELETS: 376 10*3/uL (ref 150–450)
RBC: 4.8 x10E6/uL (ref 3.77–5.28)
RDW: 12.9 % (ref 12.3–15.4)
WBC: 8.1 10*3/uL (ref 3.4–10.8)

## 2018-06-30 LAB — CMP14+EGFR
ALT: 19 IU/L (ref 0–32)
AST: 24 IU/L (ref 0–40)
Albumin/Globulin Ratio: 1.8 (ref 1.2–2.2)
Albumin: 3.9 g/dL (ref 3.5–4.8)
Alkaline Phosphatase: 133 IU/L — ABNORMAL HIGH (ref 39–117)
BUN/Creatinine Ratio: 17 (ref 12–28)
BUN: 14 mg/dL (ref 8–27)
Bilirubin Total: 0.7 mg/dL (ref 0.0–1.2)
CALCIUM: 9.2 mg/dL (ref 8.7–10.3)
CO2: 26 mmol/L (ref 20–29)
CREATININE: 0.81 mg/dL (ref 0.57–1.00)
Chloride: 99 mmol/L (ref 96–106)
GFR, EST AFRICAN AMERICAN: 85 mL/min/{1.73_m2} (ref >59)
GFR, EST NON AFRICAN AMERICAN: 74 mL/min/{1.73_m2} (ref >59)
GLUCOSE: 105 mg/dL — AB (ref 65–99)
Globulin, Total: 2.2 g/dL (ref 1.5–4.5)
Potassium: 4.4 mmol/L (ref 3.5–5.2)
Sodium: 143 mmol/L (ref 134–144)
TOTAL PROTEIN: 6.1 g/dL (ref 6.0–8.5)

## 2018-07-12 ENCOUNTER — Other Ambulatory Visit: Payer: Self-pay | Admitting: Family

## 2018-07-12 DIAGNOSIS — F411 Generalized anxiety disorder: Secondary | ICD-10-CM

## 2018-07-13 ENCOUNTER — Ambulatory Visit: Payer: Medicare HMO | Admitting: Family

## 2018-07-13 DIAGNOSIS — N390 Urinary tract infection, site not specified: Secondary | ICD-10-CM | POA: Diagnosis not present

## 2018-07-13 DIAGNOSIS — K5730 Diverticulosis of large intestine without perforation or abscess without bleeding: Secondary | ICD-10-CM | POA: Diagnosis not present

## 2018-07-17 ENCOUNTER — Other Ambulatory Visit: Payer: Self-pay | Admitting: General Surgery

## 2018-07-19 ENCOUNTER — Other Ambulatory Visit: Payer: Self-pay | Admitting: General Surgery

## 2018-07-19 DIAGNOSIS — N824 Other female intestinal-genital tract fistulae: Secondary | ICD-10-CM

## 2018-07-21 ENCOUNTER — Other Ambulatory Visit: Payer: Medicare HMO

## 2018-07-23 ENCOUNTER — Ambulatory Visit
Admission: RE | Admit: 2018-07-23 | Discharge: 2018-07-23 | Disposition: A | Discharge status: Home / Self Care | Payer: Medicare HMO | Source: Ambulatory Visit | Attending: General Surgery | Admitting: General Surgery

## 2018-07-23 DIAGNOSIS — K5730 Diverticulosis of large intestine without perforation or abscess without bleeding: Secondary | ICD-10-CM | POA: Diagnosis not present

## 2018-07-23 DIAGNOSIS — N824 Other female intestinal-genital tract fistulae: Secondary | ICD-10-CM

## 2018-08-26 ENCOUNTER — Ambulatory Visit (INDEPENDENT_AMBULATORY_CARE_PROVIDER_SITE_OTHER): Payer: Medicare HMO

## 2018-08-26 DIAGNOSIS — I442 Atrioventricular block, complete: Secondary | ICD-10-CM | POA: Diagnosis not present

## 2018-08-26 NOTE — Progress Notes (Signed)
Remote pacemaker transmission.   

## 2018-08-31 ENCOUNTER — Encounter: Payer: Self-pay | Admitting: Cardiology

## 2018-09-02 ENCOUNTER — Other Ambulatory Visit: Payer: Self-pay | Admitting: *Deleted

## 2018-09-02 MED ORDER — POTASSIUM CHLORIDE CRYS ER 20 MEQ PO TBCR: 20.0000 meq | EXTENDED_RELEASE_TABLET | Freq: Every day | ORAL | 1 refills | Status: DC

## 2018-09-02 MED ORDER — POTASSIUM CHLORIDE CRYS ER 20 MEQ PO TBCR: 20.0000 meq | EXTENDED_RELEASE_TABLET | Freq: Every day | ORAL | 2 refills | Status: DC

## 2018-09-13 ENCOUNTER — Other Ambulatory Visit: Payer: Self-pay | Admitting: Family

## 2018-09-13 DIAGNOSIS — J069 Acute upper respiratory infection, unspecified: Secondary | ICD-10-CM

## 2018-09-27 ENCOUNTER — Encounter: Payer: Self-pay | Admitting: Family

## 2018-09-27 ENCOUNTER — Ambulatory Visit (INDEPENDENT_AMBULATORY_CARE_PROVIDER_SITE_OTHER): Payer: Medicare HMO | Admitting: Family

## 2018-09-27 VITALS — BP 118/77 | HR 77 | Temp 96.8°F | Ht 64.0 in | Wt 189.0 lb

## 2018-09-27 DIAGNOSIS — E162 Hypoglycemia, unspecified: Secondary | ICD-10-CM

## 2018-09-27 DIAGNOSIS — R531 Weakness: Secondary | ICD-10-CM

## 2018-09-27 DIAGNOSIS — I4891 Unspecified atrial fibrillation: Secondary | ICD-10-CM | POA: Diagnosis not present

## 2018-09-27 DIAGNOSIS — R55 Syncope and collapse: Secondary | ICD-10-CM

## 2018-09-27 LAB — BAYER DCA HB A1C WAIVED: HB A1C (BAYER DCA - WAIVED): 5.9 % (ref <7.0)

## 2018-09-27 LAB — GLUCOSE HEMOCUE WAIVED: Glu Hemocue Waived: 104 mg/dL — ABNORMAL HIGH (ref 65–99)

## 2018-09-27 NOTE — Patient Instructions (Addendum)
Syncope    Syncope refers to a condition in which a person temporarily loses consciousness. Syncope may also be called fainting or passing out. It is caused by a sudden decrease in blood flow to the brain. Even though most causes of syncope are not dangerous, syncope can be a sign of a serious medical problem. Your health care provider may do tests to find the reason why you are having syncope.  Signs that you may be about to faint include:   Feeling dizzy or light-headed.   Feeling nauseous.   Seeing all white or all black in your field of vision.   Having cold, clammy skin.  If you faint, get medical help right away. Call your local emergency services (911 in the [place] Do not drive yourself to the hospital.  Follow these instructions at home:  Pay attention to any changes in your symptoms. Take these actions to stay safe and to help relieve your symptoms:  Lifestyle   Do not drive, use machinery, or play sports until your health care provider says it is okay.   Do not drink alcohol.   Do not use any products that contain nicotine or tobacco, such as cigarettes and e-cigarettes. If you need help quitting, ask your health care provider.   Drink enough fluid to keep your urine pale yellow.  General instructions   Take over-the-counter and prescription medicines only as told by your health care provider.   If you are taking blood pressure or heart medicine, get up slowly and take several minutes to sit and then stand. This can reduce dizziness or light-headedness.   Have someone stay with you until you feel stable.   If you start to feel like you might faint, lie down right away and raise (elevate) your feet above the level of your heart. Breathe deeply and steadily. Wait until all the symptoms have passed.   Keep all follow-up visits as told by your health care provider. This is important.  Get help right away if you:   Have a severe headache.   Faint once or repeatedly.   Have pain in your chest,  abdomen, or back.   Have a very fast or irregular heartbeat (palpitations).   Have pain when you breathe.   Are bleeding from your mouth or rectum, or you have black or tarry stool.   Have a seizure.   Are confused.   Have trouble walking.   Have severe weakness.   Have vision problems.  These symptoms may represent a serious problem that is an emergency. Do not wait to see if your symptoms will go away. Get medical help right away. Call your local emergency services (911 in the [place] Do not drive yourself to the hospital.  Summary   Syncope refers to a condition in which a person temporarily loses consciousness. It is caused by a sudden decrease in blood flow to the brain.   Signs that you may be about to faint include dizziness, feeling light-headed, feeling nauseous, sudden vision changes, or cold, clammy skin.   Although most causes of syncope are not dangerous, syncope can be a sign of a serious medical problem. If you faint, get medical help right away.  This information is not intended to replace advice given to you by your health care provider. Make sure you discuss any questions you have with your health care provider.  Document Released: 09/08/2005 Document Revised: 08/17/2017 Document Reviewed: 08/17/2017  Elsevier Interactive Patient Education  2019 [employer]

## 2018-09-27 NOTE — Progress Notes (Signed)
 Subjective:    Patient ID: Sabrina [name], female    DOB: 08/20/1946, 73 y.o.   MRN: [id number]  Chief Complaint  Patient presents with  . Follow up . EMS says she had low blood sugar    check for new diabetic   PT presents to the office today with complaints of syncope, confusion, diaphoreses. States two weeks ago where she "passed out" and EMS was called. She was told her glucose was 51 when they arrived and then later after drinking a "coke" it was 86.   She has A Fib and CHF. She is followed by Cardiologists and has an appointment 09/29/17.   Loss of Consciousness  This is a new problem. The current episode started 1 to 4 weeks ago. Exacerbated by: walking.      Review of Systems  Constitutional: Positive for fatigue.  Cardiovascular: Positive for syncope.  Neurological: Positive for syncope.  All other systems reviewed and are negative.      Objective:   Physical Exam Vitals signs reviewed.  Constitutional:      General: She is not in acute distress.    Appearance: She is well-developed.  HENT:     Head: Normocephalic and atraumatic.     Right Ear: External ear normal.  Eyes:     Pupils: Pupils are equal, round, and reactive to light.  Neck:     Musculoskeletal: Normal range of motion and neck supple.     Thyroid: No thyromegaly.  Cardiovascular:     Rate and Rhythm: Normal rate and regular rhythm.     Heart sounds: Normal heart sounds. No murmur.  Pulmonary:     Effort: Pulmonary effort is normal. No respiratory distress.     Breath sounds: Normal breath sounds. No wheezing.  Abdominal:     General: Bowel sounds are normal. There is no distension.     Palpations: Abdomen is soft.     Tenderness: There is no abdominal tenderness.  Musculoskeletal: Normal range of motion.        General: No tenderness.  Skin:    General: Skin is warm and dry.  Neurological:     Mental Status: She is alert and oriented to person, place, and time.     Cranial Nerves:  No cranial nerve deficit.     Deep Tendon Reflexes: Reflexes are normal and symmetric.  Psychiatric:        Behavior: Behavior normal.        Thought Content: Thought content normal.        Judgment: Judgment normal.       BP 118/77   Pulse 77   Temp (!) 96.8 F (36 C) (Oral)   Ht 5' 4" (1.626 m)   Wt 189 lb (85.7 kg)   BMI 32.44 kg/m      Assessment & Plan:  Bera C [place] comes in today with chief complaint of Follow up . EMS says she had low blood sugar (check for new diabetic)   Diagnosis and orders addressed:  1. Weakness - Bayer DCA Hb A1c Waived - Glucose Hemocue Waived - BMP8+EGFR  2. Atrial fibrillation, unspecified type (HCC) - BMP8+EGFR  3. Near syncope - BMP8+EGFR  4. Hypoglycemia - BMP8+EGFR   Glucose and A1C stable today. I am not sure that is related to glucose readings and might be related more to her heart? She has cardiologists appointment in two days.  BMP pending Neurologists referral pending  RTO 1 month       Evelina Dun, FNP

## 2018-09-28 ENCOUNTER — Encounter: Payer: Self-pay | Admitting: Neurology

## 2018-09-28 LAB — BMP8+EGFR
BUN/Creatinine Ratio: 14 (ref 12–28)
BUN: 12 mg/dL (ref 8–27)
CO2: 26 mmol/L (ref 20–29)
Calcium: 8.9 mg/dL (ref 8.7–10.3)
Chloride: 103 mmol/L (ref 96–106)
Creatinine, Ser: 0.83 mg/dL (ref 0.57–1.00)
GFR calc Af Amer: 82 mL/min/{1.73_m2} (ref >59)
GFR calc non Af Amer: 71 mL/min/{1.73_m2} (ref >59)
Glucose: 96 mg/dL (ref 65–99)
Potassium: 4.4 mmol/L (ref 3.5–5.2)
Sodium: 143 mmol/L (ref 134–144)

## 2018-09-29 ENCOUNTER — Ambulatory Visit (INDEPENDENT_AMBULATORY_CARE_PROVIDER_SITE_OTHER): Payer: Medicare HMO | Admitting: Internal Medicine

## 2018-09-29 ENCOUNTER — Encounter: Payer: Self-pay | Admitting: Internal Medicine

## 2018-09-29 VITALS — BP 110/70 | HR 88 | Ht 64.0 in | Wt 189.0 lb

## 2018-09-29 DIAGNOSIS — I5032 Chronic diastolic (congestive) heart failure: Secondary | ICD-10-CM | POA: Diagnosis not present

## 2018-09-29 DIAGNOSIS — I442 Atrioventricular block, complete: Secondary | ICD-10-CM

## 2018-09-29 DIAGNOSIS — I4819 Other persistent atrial fibrillation: Secondary | ICD-10-CM | POA: Diagnosis not present

## 2018-09-29 DIAGNOSIS — I10 Essential (primary) hypertension: Secondary | ICD-10-CM

## 2018-09-29 DIAGNOSIS — Z950 Presence of cardiac pacemaker: Secondary | ICD-10-CM

## 2018-09-29 NOTE — Patient Instructions (Signed)
Medication Instructions:  Your physician recommends that you continue on your current medications as directed. Please refer to the Current Medication list given to you today.  Labwork: None ordered.  Testing/Procedures: None ordered.  Follow-Up: Your physician wants you to follow-up in: one year with Dr. Lovena Le.   You will receive a reminder letter in the mail two months in advance. If you don't receive a letter, please call our office to schedule the follow-up appointment.  Remote monitoring is used to monitor your Pacemaker from home. This monitoring reduces the number of office visits required to check your device to one time per year. It allows [place] to keep an eye on the functioning of your device to ensure it is working properly. You are scheduled for a device check from home on 11/25/2018. You may send your transmission at any time that day. If you have a wireless device, the transmission will be sent automatically. After your physician reviews your transmission, you will receive a postcard with your next transmission date.  Any Other Special Instructions Will Be Listed Below (If Applicable).  If you need a refill on your cardiac medications before your next appointment, please call your pharmacy.

## 2018-09-29 NOTE — Progress Notes (Signed)
HPI [name] returns today for ongoing evaluation and management of atrial fib, CHB, s/p PPM insertion. She underwent insertion of a DDD PM with his bundle lead and RV lead followed by AV node ablation. She has done well in the interim. She denies chest pain or sob. No syncope. No palpitations.  Allergies  Allergen Reactions  . Codeine Nausea And Vomiting    Stomach pain also     Current Outpatient Medications  Medication Sig Dispense Refill  . atorvastatin (LIPITOR) 20 MG tablet Take 1 tablet (20 mg total) by mouth daily. 90 tablet 3  . calcium carbonate (OSCAL) 1500 (600 Ca) MG TABS tablet Take by mouth daily with breakfast.    . DULoxetine (CYMBALTA) 60 MG capsule TAKE 1 CAPSULE BY MOUTH EVERY DAY 90 capsule 0  . ELIQUIS 5 MG TABS tablet TAKE 1 TABLET (5 MG TOTAL) BY MOUTH 2 (TWO) TIMES DAILY. 60 tablet 5  . fluticasone (FLONASE) 50 MCG/ACT nasal spray SPRAY 2 SPRAYS INTO EACH NOSTRIL EVERY DAY 48 g 1  . furosemide (LASIX) 40 MG tablet Take 1 tablet (40 mg total) by mouth daily. 90 tablet 2  . gabapentin (NEURONTIN) 100 MG capsule TAKE 1 CAPSULE BY MOUTH TWICE A DAY 180 capsule 0  . metoprolol succinate (TOPROL-XL) 25 MG 24 hr tablet Take 1 tablet (25 mg total) by mouth daily. 90 tablet 3  . Multiple Vitamins-Minerals (ONE-A-DAY 50 PLUS PO) Take 1 tablet by mouth 2 (two) times daily.    . Omega-3 Fatty Acids (FISH OIL PO) Take 1 capsule by mouth daily.    . potassium chloride SA (K-DUR,KLOR-CON) 20 MEQ tablet Take 1 tablet (20 mEq total) by mouth daily. 90 tablet 2  . PRESCRIPTION MEDICATION Apply 1 application topically daily as needed (excema). HAL Ointment    . triamcinolone ointment (KENALOG) 0.5 % Apply 1 application topically 2 (two) times daily. 30 g 0  . VENTOLIN HFA 108 (90 Base) MCG/ACT inhaler TAKE 2 PUFFS BY MOUTH EVERY 6 HOURS AS NEEDED FOR WHEEZE OR SHORTNESS OF BREATH 18 Inhaler 2  . Vitamin D, Ergocalciferol, (DRISDOL) 50000 units CAPS capsule TAKE 1 CAPSULE  (50,000 UNITS TOTAL) BY MOUTH EVERY 7 (SEVEN) DAYS. 12 capsule 1   No current facility-administered medications for this visit.      Past Medical History:  Diagnosis Date  . Anemia   . Ankle fracture, right    past hx. -"no surgery"  . Anxiety   . Asthma   . CHF (congestive heart failure) ([place]) 2009  . Chronic lower back pain   . Collagen vascular disease ([place])   . COPD (chronic obstructive pulmonary disease) ([place])   . Depression   . Fibromyalgia   . GERD (gastroesophageal reflux disease)   . Hyperlipidemia   . Hypertension   . Immature cataract of both eyes   . Myocardial infarction ([place])    "I've had a light one; don't know when it happened" (08/27/2017)  . Osteoarthritis   . Peripheral neuropathy    legs and feet  . Persistent atrial fibrillation   . Tubular adenoma of colon     ROS:   All systems reviewed and negative except as noted in the HPI.   Past Surgical History:  Procedure Laterality Date  . APPENDECTOMY    . AV NODE ABLATION N/A 08/27/2017   Procedure: AV NODE ABLATION;  Surgeon: Evans Lance, MD;  [facility];  Service: Cardiovascular;  Laterality: N/A;  .  CHOLECYSTECTOMY OPEN  1978  . DILATION AND CURETTAGE OF UTERUS    . FEMUR FRACTURE SURGERY Left 2013   "put 7" rod in it"  . FRACTURE SURGERY    . LAPAROSCOPY  08/22/2016   Procedure: LAPAROSCOPY DIAGNOSTIC;  Surgeon: [name], MD;  Location: [facility];  Service: General;;  . MEDIAL PARTIAL KNEE REPLACEMENT Right 2005   "@ [facility]"  . PACEMAKER IMPLANT N/A 08/27/2017   Procedure: PACEMAKER IMPLANT;  Surgeon: Evans Lance, MD;  Location: [facility];  Service: Cardiovascular;  Laterality: N/A;  . ROUX-EN-Y GASTRIC BYPASS  2002   [place]  . SPLENECTOMY  2002  . TONSILLECTOMY  1944  . TUBAL LIGATION    . VAGINAL HYSTERECTOMY       Family History  Problem Relation Age of Onset  . Cancer Mother   . Alzheimer's disease Mother   . Arthritis Father    . Colon cancer Paternal Uncle 30     Social History   Socioeconomic History  . Marital status: Married    Spouse name: Not on file  . Number of children: Not on file  . Years of education: Not on file  . Highest education level: Not on file  Occupational History  . Not on file  Social Needs  . Financial resource strain: Not on file  . Food insecurity:    Worry: Not on file    Inability: Not on file  . Transportation needs:    Medical: Not on file    Non-medical: Not on file  Tobacco Use  . Smoking status: Former Smoker    Packs/day: 0.50    Years: 25.00    Pack years: 12.50    Types: Cigarettes    Last attempt to quit: 02/26/1993    Years since quitting: 25.6  . Smokeless tobacco: Never Used  Substance and Sexual Activity  . Alcohol use: No    Comment: 08/27/2017 "nothing since early 2000s"  . Drug use: No  . Sexual activity: Not Currently    Birth control/protection: Surgical  Lifestyle  . Physical activity:    Days per week: Not on file    Minutes per session: Not on file  . Stress: Not on file  Relationships  . Social connections:    Talks on phone: Not on file    Gets together: Not on file    Attends religious service: Not on file    Active member of club or organization: Not on file    Attends meetings of clubs or organizations: Not on file    Relationship status: Not on file  . Intimate partner violence:    Fear of current or ex partner: Not on file    Emotionally abused: Not on file    Physically abused: Not on file    Forced sexual activity: Not on file  Other Topics Concern  . Not on file  Social History Narrative  . Not on file     BP 110/70   Pulse 88   Ht 5\' 4"  (1.626 m)   Wt 189 lb (85.7 kg)   BMI 32.44 kg/m   Physical Exam:  Well appearing NAD HEENT: Unremarkable Neck:  No JVD, no thyromegally Lymphatics:  No adenopathy Back:  No CVA tenderness Lungs:  Clear with no wheezes HEART:  Regular rate rhythm, no murmurs, no rubs, no  clicks Abd:  soft, positive bowel sounds, no organomegally, no rebound, no guarding Ext:  2 plus pulses, no  edema, no cyanosis, no clubbing Skin:  No rashes no nodules Neuro:  CN II through XII intact, motor grossly intact  EKG - atrial fib with ventricular pacing  DEVICE  Normal device function.  See PaceArt for details.   Assess/Plan: 1. Atrial fib - her rate is well controlled, s/p AV Node ablation. 2. PPM - her medtronic DDD PPM with a His bundle lead in the atrial port is working normally.  3. HTN - she has had some dizzy spells and I have asked her to stop her metoprolol.  Mikle Bosworth.D.

## 2018-09-30 ENCOUNTER — Other Ambulatory Visit: Payer: Self-pay | Admitting: Internal Medicine

## 2018-09-30 LAB — CUP PACEART INCLINIC DEVICE CHECK
Battery Remaining Longevity: 24 mo
Battery Voltage: 2.89 V
Brady Statistic AP VS Percent: 99.7 %
Brady Statistic AS VP Percent: 0 %
Brady Statistic AS VS Percent: 0.07 %
Brady Statistic RA Percent Paced: 99.96 %
Implantable Lead Implant Date: 20181206
Implantable Lead Implant Date: 20181206
Implantable Lead Location: 753860
Implantable Lead Location: 753860
Implantable Lead Model: 3830
Implantable Lead Model: 5076
Implantable Pulse Generator Implant Date: 20181206
Lead Channel Impedance Value: 304 Ohm
Lead Channel Impedance Value: 361 Ohm
Lead Channel Impedance Value: 399 Ohm
Lead Channel Impedance Value: 589 Ohm
Lead Channel Sensing Intrinsic Amplitude: 4.75 mV
Lead Channel Sensing Intrinsic Amplitude: 9.75 mV
Lead Channel Sensing Intrinsic Amplitude: 9.75 mV
Lead Channel Setting Pacing Amplitude: 2 V
Lead Channel Setting Pacing Amplitude: 2.5 V
Lead Channel Setting Pacing Pulse Width: 0.3 ms
Lead Channel Setting Sensing Sensitivity: 2 mV
MDC IDC MSMT LEADCHNL RA SENSING INTR AMPL: 4.75 mV
MDC IDC SESS DTM: 20200108191838
MDC IDC STAT BRADY AP VP PERCENT: 0.23 %
MDC IDC STAT BRADY RV PERCENT PACED: 0.23 %

## 2018-09-30 NOTE — Telephone Encounter (Signed)
Eliquis 5mg  refill request received; pt is 73 yrs old, wt-85.7kg, Crea-0.83 on 09/27/2018, last seen by Dr. Lovena Le on 09/29/2018; will send refill to requested pharmacy.

## 2018-10-04 ENCOUNTER — Encounter: Payer: Self-pay | Admitting: Neurology

## 2018-10-04 ENCOUNTER — Other Ambulatory Visit: Payer: Self-pay

## 2018-10-04 ENCOUNTER — Ambulatory Visit (INDEPENDENT_AMBULATORY_CARE_PROVIDER_SITE_OTHER): Payer: Medicare HMO | Admitting: Neurology

## 2018-10-04 VITALS — BP 118/66 | HR 87 | Ht 64.0 in | Wt 190.0 lb

## 2018-10-04 DIAGNOSIS — G44309 Post-traumatic headache, unspecified, not intractable: Secondary | ICD-10-CM | POA: Diagnosis not present

## 2018-10-04 DIAGNOSIS — R404 Transient alteration of awareness: Secondary | ICD-10-CM

## 2018-10-04 MED ORDER — AMITRIPTYLINE HCL 10 MG PO TABS: 10.0000 mg | ORAL_TABLET | Freq: Every day | ORAL | 3 refills | Status: DC

## 2018-10-04 NOTE — Patient Instructions (Addendum)
1. Schedule Head CT without contrast  We have sent a referral to [facility] for your MRI and they will call you directly to schedule your appt. They are located at [place]. If you need to contact them directly please call [phone].  2. Schedule 1-hour EEG 3. Start amitriptyline 10mg : take 1 tablet every night to help reduce frequency and intensity of headaches 4. Minimize Ibuprofen/Tylenol or any over the counter pain medication to 2-3 times a week, otherwise headaches can worsen 5. Keep a log of your symptoms, recommend checking blood sugar and BP during the episodes 6. Follow-up in 3-4 months, call for any changes
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
## 2018-10-06 ENCOUNTER — Ambulatory Visit
Admission: RE | Admit: 2018-10-06 | Discharge: 2018-10-06 | Disposition: A | Discharge status: Home / Self Care | Payer: Medicare HMO | Source: Ambulatory Visit | Attending: Neurology | Admitting: Neurology

## 2018-10-06 DIAGNOSIS — R404 Transient alteration of awareness: Secondary | ICD-10-CM

## 2018-10-06 DIAGNOSIS — S0990XA Unspecified injury of head, initial encounter: Secondary | ICD-10-CM | POA: Diagnosis not present

## 2018-10-06 DIAGNOSIS — R51 Headache: Secondary | ICD-10-CM | POA: Diagnosis not present

## 2018-10-06 DIAGNOSIS — G44309 Post-traumatic headache, unspecified, not intractable: Secondary | ICD-10-CM

## 2018-10-07 ENCOUNTER — Telehealth: Payer: Self-pay | Admitting: *Deleted

## 2018-10-07 NOTE — Telephone Encounter (Signed)
Called to confirm EEG appointment next Monday 10/11/18 but she signed release to only speak to her so I did not leave a message on her answering machine.

## 2018-10-11 ENCOUNTER — Ambulatory Visit (INDEPENDENT_AMBULATORY_CARE_PROVIDER_SITE_OTHER): Payer: Medicare HMO | Admitting: Neurology

## 2018-10-11 DIAGNOSIS — R404 Transient alteration of awareness: Secondary | ICD-10-CM | POA: Diagnosis not present

## 2018-10-11 DIAGNOSIS — G44309 Post-traumatic headache, unspecified, not intractable: Secondary | ICD-10-CM | POA: Diagnosis not present

## 2018-10-12 LAB — CUP PACEART REMOTE DEVICE CHECK
Battery Remaining Longevity: 23 mo
Battery Voltage: 2.9 V
Brady Statistic AP VP Percent: 0.05 %
Brady Statistic AP VS Percent: 99.75 %
Brady Statistic AS VP Percent: 0 %
Brady Statistic AS VS Percent: 0.2 %
Brady Statistic RA Percent Paced: 99.83 %
Brady Statistic RV Percent Paced: 0.05 %
Implantable Lead Implant Date: 20181206
Implantable Lead Implant Date: 20181206
Implantable Lead Location: 753860
Implantable Lead Location: 753860
Implantable Lead Model: 5076
Implantable Pulse Generator Implant Date: 20181206
Lead Channel Impedance Value: 304 Ohm
Lead Channel Impedance Value: 380 Ohm
Lead Channel Impedance Value: 399 Ohm
Lead Channel Impedance Value: 570 Ohm
Lead Channel Sensing Intrinsic Amplitude: 4.5 mV
Lead Channel Sensing Intrinsic Amplitude: 4.5 mV
Lead Channel Sensing Intrinsic Amplitude: 7.25 mV
Lead Channel Sensing Intrinsic Amplitude: 7.25 mV
Lead Channel Setting Pacing Amplitude: 2 V
Lead Channel Setting Pacing Amplitude: 4 V
Lead Channel Setting Pacing Pulse Width: 0.4 ms
Lead Channel Setting Sensing Sensitivity: 2 mV
MDC IDC SESS DTM: 20191205172821

## 2018-10-12 NOTE — Procedures (Signed)
ELECTROENCEPHALOGRAM REPORT  Date of Study: 10/11/2018  Patient's Name: Sabrina [name] MRN: [id number] Date of Birth: 07-Dec-1945  Referring Provider: Dr. Ellouise Newer  Clinical History: This is a 73 year old woman with episodes of near syncope and weakness  Medications: NEURONTIN 100 MG capsule  LIPITOR 20 MG tablet  OSCAL 1500 (600 Ca) MG TABS tablet  CYMBALTA) 60 MG capsule  ELIQUIS 5 MG TABS tablet  FLONASE 50 MCG/ACT nasal spray  LASIX 40 MG tablet  TOPROL-XL 25 MG 24 hr tablet  ONE-A-DAY 50 PLUS PO  FISH OIL PO   K-DUR,KLOR-CON 20 MEQ tablet  PRESCRIPTION MEDICATION HAL Ointment   KENALOG 0.5 %  VENTOLIN HFA 108 (90 Base) MCG/ACT inhaler DRISDOL 13143 units CAPS capsule  Technical Summary: A multichannel digital 1-hour EEG recording measured by the international 10-20 system with electrodes applied with paste and impedances below 5000 ohms performed in our laboratory with EKG monitoring in an awake and asleep patient.  Hyperventilation was not performed. Photic stimulation was performed.  The digital EEG was referentially recorded, reformatted, and digitally filtered in a variety of bipolar and referential montages for optimal display.    Description: The patient is awake and asleep during the recording.  During maximal wakefulness, there is a symmetric, medium voltage 9 Hz posterior dominant rhythm that attenuates with eye opening.  The record is symmetric.  During drowsiness and sleep, there is an increase in theta slowing of the background.  Vertex waves and symmetric sleep spindles were seen.  Photic stimulation did not elicit any abnormalities.  There were no epileptiform discharges or electrographic seizures seen.    EKG lead was unremarkable.  Impression: This 1-hour awake and asleep EEG is normal.    Clinical Correlation: A normal EEG does not exclude a clinical diagnosis of epilepsy.  If further clinical questions remain, prolonged EEG may be helpful.   Clinical correlation is advised.   Ellouise Newer, M.D.

## 2018-10-14 ENCOUNTER — Other Ambulatory Visit: Payer: Self-pay | Admitting: Family

## 2018-10-14 DIAGNOSIS — F411 Generalized anxiety disorder: Secondary | ICD-10-CM

## 2018-10-27 ENCOUNTER — Other Ambulatory Visit: Payer: Self-pay | Admitting: Neurology

## 2018-10-29 ENCOUNTER — Telehealth: Payer: Self-pay | Admitting: Neurology

## 2018-10-29 NOTE — Telephone Encounter (Signed)
Patient called to get her EEG results. Please Call. Thanks

## 2018-10-29 NOTE — Telephone Encounter (Signed)
Patient made aware of EEG results (normal). She states [facility] contacted her and stated they needed a prior authorization for her amitriptyline medication. [place] - please work on this.

## 2018-11-02 NOTE — Telephone Encounter (Signed)
Pt called to check status of prior auth for amitryptaline. Pt ph [phone]

## 2018-11-02 NOTE — Telephone Encounter (Signed)
Not quite sure how, but I received notice from [facility] that pt's Amitriptyline has been   Approved through 09/22/2019

## 2018-11-02 NOTE — Telephone Encounter (Signed)
Attempted numerous time to initiated PA for pt's amitriptyline.  Unfortunately, none of the information I have matches with what insurance must have.  Per both insurance cards on file, pt's last name is RAINEY.  Attempted PA with both last names and received the following message with both:  Cannot find matching patient

## 2018-11-03 ENCOUNTER — Encounter: Payer: Self-pay | Admitting: Family Medicine

## 2018-11-03 ENCOUNTER — Ambulatory Visit (INDEPENDENT_AMBULATORY_CARE_PROVIDER_SITE_OTHER): Payer: Medicare HMO

## 2018-11-03 ENCOUNTER — Ambulatory Visit (INDEPENDENT_AMBULATORY_CARE_PROVIDER_SITE_OTHER): Payer: Medicare HMO | Admitting: Family Medicine

## 2018-11-03 VITALS — BP 121/83 | HR 86 | Temp 98.6°F | Ht 64.0 in | Wt 187.0 lb

## 2018-11-03 DIAGNOSIS — M25562 Pain in left knee: Secondary | ICD-10-CM

## 2018-11-03 DIAGNOSIS — S8992XA Unspecified injury of left lower leg, initial encounter: Secondary | ICD-10-CM | POA: Diagnosis not present

## 2018-11-03 DIAGNOSIS — Z9889 Other specified postprocedural states: Secondary | ICD-10-CM | POA: Diagnosis not present

## 2018-11-03 DIAGNOSIS — M79672 Pain in left foot: Secondary | ICD-10-CM

## 2018-11-03 DIAGNOSIS — S99922A Unspecified injury of left foot, initial encounter: Secondary | ICD-10-CM | POA: Diagnosis not present

## 2018-11-03 DIAGNOSIS — J449 Chronic obstructive pulmonary disease, unspecified: Secondary | ICD-10-CM | POA: Diagnosis not present

## 2018-11-03 MED ORDER — DICLOFENAC SODIUM 1 % TD GEL: 4.0000 g | Freq: Four times a day (QID) | TRANSDERMAL | 0 refills | Status: DC

## 2018-11-03 MED ORDER — PREDNISONE 20 MG PO TABS: 40.0000 mg | ORAL_TABLET | Freq: Every day | ORAL | 0 refills | Status: DC

## 2018-11-03 NOTE — Progress Notes (Signed)
Subjective: CC: Foot pain PCP: Sharion Balloon, FNP [id number] Sabrina [name] is a 73 y.o. female presenting to clinic today for:  1.  Foot pain Patient reports that she sustained a fall about a month ago where she fell onto the left knee.  She reports buckling of the left knee.  She has had subsequent left-sided knee pain, swelling and sensation of instability.  She has been using hot and cold compresses with little improvement in symptoms.  She reports associated left knee stiffness and pain with stepping down onto the left foot.  She points to the forefoot at along the second and third digits as a source of pain.  She has chronic lower extremity edema but left-sided lower extremity edema seems to be more prominent since the fall.  She is chronically anticoagulated with Eliquis.  No increased warmth or redness.  Additionally, she has history of left-sided knee surgery and she worries that she may have disrupted her previous surgery.  This was performed at [facility] many years ago.   ROS: Per HPI  Allergies  Allergen Reactions  . Codeine Nausea And Vomiting    Stomach pain also   Past Medical History:  Diagnosis Date  . Anemia   . Ankle fracture, right    past hx. -"no surgery"  . Anxiety   . Asthma   . CHF (congestive heart failure) ([place]) 2009  . Chronic lower back pain   . Collagen vascular disease ([place])   . COPD (chronic obstructive pulmonary disease) ([place])   . Depression   . Fibromyalgia   . GERD (gastroesophageal reflux disease)   . Hyperlipidemia   . Hypertension   . Immature cataract of both eyes   . Myocardial infarction ([place])    "I've had a light one; don't know when it happened" (08/27/2017)  . Osteoarthritis   . Peripheral neuropathy    legs and feet  . Persistent atrial fibrillation   . Tubular adenoma of colon     Current Outpatient Medications:  .  amitriptyline (ELAVIL) 10 MG tablet, TAKE 1 TABLET BY MOUTH EVERYDAY AT BEDTIME, Disp: 90 tablet, Rfl: 2 .   atorvastatin (LIPITOR) 20 MG tablet, Take 1 tablet (20 mg total) by mouth daily., Disp: 90 tablet, Rfl: 3 .  calcium carbonate (OSCAL) 1500 (600 Ca) MG TABS tablet, Take by mouth daily with breakfast., Disp: , Rfl:  .  DULoxetine (CYMBALTA) 60 MG capsule, TAKE 1 CAPSULE BY MOUTH EVERY DAY, Disp: 90 capsule, Rfl: 0 .  ELIQUIS 5 MG TABS tablet, TAKE 1 TABLET BY MOUTH TWICE A DAY, Disp: 60 tablet, Rfl: 11 .  fluticasone (FLONASE) 50 MCG/ACT nasal spray, SPRAY 2 SPRAYS INTO EACH NOSTRIL EVERY DAY, Disp: 48 g, Rfl: 1 .  furosemide (LASIX) 40 MG tablet, Take 1 tablet (40 mg total) by mouth daily., Disp: 90 tablet, Rfl: 2 .  gabapentin (NEURONTIN) 100 MG capsule, TAKE 1 CAPSULE BY MOUTH TWICE A DAY, Disp: 180 capsule, Rfl: 0 .  Multiple Vitamins-Minerals (ONE-A-DAY 50 PLUS PO), Take 1 tablet by mouth 2 (two) times daily., Disp: , Rfl:  .  Omega-3 Fatty Acids (FISH OIL PO), Take 1 capsule by mouth daily., Disp: , Rfl:  .  potassium chloride SA (K-DUR,KLOR-CON) 20 MEQ tablet, Take 1 tablet (20 mEq total) by mouth daily., Disp: 90 tablet, Rfl: 2 .  PRESCRIPTION MEDICATION, Apply 1 application topically daily as needed (excema). HAL Ointment, Disp: , Rfl:  .  triamcinolone ointment (KENALOG) 0.5 %, Apply 1 application  topically 2 (two) times daily., Disp: 30 g, Rfl: 0 .  VENTOLIN HFA 108 (90 Base) MCG/ACT inhaler, TAKE 2 PUFFS BY MOUTH EVERY 6 HOURS AS NEEDED FOR WHEEZE OR SHORTNESS OF BREATH, Disp: 18 Inhaler, Rfl: 2 .  Vitamin D, Ergocalciferol, (DRISDOL) 50000 units CAPS capsule, TAKE 1 CAPSULE (50,000 UNITS TOTAL) BY MOUTH EVERY 7 (SEVEN) DAYS., Disp: 12 capsule, Rfl: 1 Social History   Socioeconomic History  . Marital status: Married    Spouse name: Not on file  . Number of children: Not on file  . Years of education: Not on file  . Highest education level: Not on file  Occupational History  . Not on file  Social Needs  . Financial resource strain: Not on file  . Food insecurity:    Worry: Not  on file    Inability: Not on file  . Transportation needs:    Medical: Not on file    Non-medical: Not on file  Tobacco Use  . Smoking status: Former Smoker    Packs/day: 0.50    Years: 25.00    Pack years: 12.50    Types: Cigarettes    Last attempt to quit: 02/26/1993    Years since quitting: 25.7  . Smokeless tobacco: Never Used  Substance and Sexual Activity  . Alcohol use: No    Comment: 08/27/2017 "nothing since early 2000s"  . Drug use: No  . Sexual activity: Not Currently    Birth control/protection: Surgical  Lifestyle  . Physical activity:    Days per week: Not on file    Minutes per session: Not on file  . Stress: Not on file  Relationships  . Social connections:    Talks on phone: Not on file    Gets together: Not on file    Attends religious service: Not on file    Active member of club or organization: Not on file    Attends meetings of clubs or organizations: Not on file    Relationship status: Not on file  . Intimate partner violence:    Fear of current or ex partner: Not on file    Emotionally abused: Not on file    Physically abused: Not on file    Forced sexual activity: Not on file  Other Topics Concern  . Not on file  Social History Narrative   Pt is right handed   Lives in single story home with her grandson   Has 3 adult children   Associated degree    Retired Quarry manager   Family History  Problem Relation Age of Onset  . Cancer Mother   . Alzheimer's disease Mother   . Arthritis Father   . Colon cancer Paternal Uncle 49    Objective: Office vital signs reviewed. BP 121/83   Pulse 86   Temp 98.6 F (37 Sabrina) (Oral)   Ht 5\' 4"  (1.626 m)   Wt 187 lb (84.8 kg)   BMI 32.10 kg/m   Physical Examination:  General: Awake, alert, frail appearing elderly female, No acute distress Extremities: warm, well perfused, 1+ pitting edema of bilateral LE L>R, no increased warmth, erythema.  She has tenderness to palpation to the anterior joint line of the knee  bilaterally.  No palpable bony deformities.  No ligamentous laxity.  She has point tenderness to palpation along the second and third MTPs as well.  No appreciable joint deformity here either. MSK: antalgic gait and station Skin: dry; intact; no rashes or lesions  No results found.  Assessment/ Plan: 73 y.o. female   1. Acute pain of left knee Status post fall.  X-rays were obtained of the knee but demonstrated no acute fractures upon personal review.  Awaiting formal review by radiology.  I do question possible meniscal injury.  I placed a referral to orthopedics given history of surgical intervention in the left knee.  For now, I have placed her in a knee brace for improved stability.  May need to consider physical therapy but will await formal assessment by Ortho.  Voltaren gel was provided for topical relief.  We discussed consideration for prednisone but patient declined this today. - DG Knee 1-2 Views Left; Future - Ambulatory referral to Orthopedic Surgery  2. Foot pain, left No evidence of acute fracture on personal view of x-ray.  Unsure if this is a bony contusion versus Morton's neuralgia.  Awaiting formal review by radiology. - DG Foot Complete Left - Ambulatory referral to Orthopedic Surgery  3. History of knee surgery As above - Ambulatory referral to Orthopedic Surgery   Orders Placed This Encounter  Procedures  . DG Foot Complete Left    Order Specific Question:   Reason for Exam (SYMPTOM  OR DIAGNOSIS REQUIRED)    Answer:   pain    Order Specific Question:   Preferred imaging location?    Answer:   Internal  . DG Knee 1-2 Views Left    Standing Status:   Future    Number of Occurrences:   1    Standing Expiration Date:   01/03/2020    Order Specific Question:   Reason for Exam (SYMPTOM  OR DIAGNOSIS REQUIRED)    Answer:   pain    Order Specific Question:   Preferred imaging location?    Answer:   Internal   Meds ordered this encounter  Medications  .  DISCONTD: predniSONE (DELTASONE) 20 MG tablet    Sig: Take 2 tablets (40 mg total) by mouth daily with breakfast for 5 days.    Dispense:  10 tablet    Refill:  0  . diclofenac sodium (VOLTAREN) 1 % GEL    Sig: Apply 4 g topically 4 (four) times daily. For joint pain    Dispense:  400 g    Refill:  [place], DO [place] [phone]

## 2018-11-03 NOTE — Patient Instructions (Addendum)
I have placed referral to the specialist for your knee and foot.  I did not see any fractures or dislocations on your exam/x-ray.  I am sending you in a short course of prednisone to help with pain and inflammation.  I have also sent you in topical Voltaren gel to use as needed as directed up to 4 times daily to the affected joints.

## 2018-11-04 ENCOUNTER — Other Ambulatory Visit: Payer: Self-pay | Admitting: Family

## 2018-11-08 ENCOUNTER — Ambulatory Visit (INDEPENDENT_AMBULATORY_CARE_PROVIDER_SITE_OTHER): Payer: Medicare HMO | Admitting: Orthopedic Surgery

## 2018-11-08 ENCOUNTER — Encounter: Payer: Self-pay | Admitting: Orthopedic Surgery

## 2018-11-08 VITALS — BP 103/72 | HR 83 | Ht 64.0 in | Wt 190.0 lb

## 2018-11-08 DIAGNOSIS — L84 Corns and callosities: Secondary | ICD-10-CM | POA: Diagnosis not present

## 2018-11-08 DIAGNOSIS — M79672 Pain in left foot: Secondary | ICD-10-CM

## 2018-11-08 DIAGNOSIS — M76822 Posterior tibial tendinitis, left leg: Secondary | ICD-10-CM | POA: Diagnosis not present

## 2018-11-08 DIAGNOSIS — S8002XA Contusion of left knee, initial encounter: Secondary | ICD-10-CM

## 2018-11-08 NOTE — Addendum Note (Signed)
Addended byCandice Camp on: 11/08/2018 04:12 PM   Modules accepted: Orders

## 2018-11-08 NOTE — Progress Notes (Signed)
NEW PATIENT OFFICE VISIT  Chief Complaint  Patient presents with  . Knee Pain    Left knee pain, fell 6 weeks ago, referred by A. Gottschalk.    73 year-old female fell injured her left knee with a direct fall onto the knee complains of pain in the front of the knee and decreased flexion and swelling.  Date of injury was unclear fell sometime in January.  Also complains of pain on the bottom of her foot which started after she injured the knee and had to walk on her tiptoes  She also has inferior medial foot and ankle pain which radiates up the left leg   Review of Systems  Constitutional: Negative for chills and fever.  Musculoskeletal: Positive for joint pain. Negative for back pain.  Neurological: Negative for tingling.     Past Medical History:  Diagnosis Date  . Anemia   . Ankle fracture, right    past hx. -"no surgery"  . Anxiety   . Asthma   . CHF (congestive heart failure) ([place]) 2009  . Chronic lower back pain   . Collagen vascular disease ([place])   . COPD (chronic obstructive pulmonary disease) ([place])   . Depression   . Fibromyalgia   . GERD (gastroesophageal reflux disease)   . Hyperlipidemia   . Hypertension   . Immature cataract of both eyes   . Myocardial infarction ([place])    "I've had a light one; don't know when it happened" (08/27/2017)  . Osteoarthritis   . Peripheral neuropathy    legs and feet  . Persistent atrial fibrillation   . Tubular adenoma of colon     Past Surgical History:  Procedure Laterality Date  . APPENDECTOMY    . AV NODE ABLATION N/A 08/27/2017   Procedure: AV NODE ABLATION;  Surgeon: Evans Lance, MD;  Location: [facility];  Service: Cardiovascular;  Laterality: N/A;  . CHOLECYSTECTOMY OPEN  1978  . DILATION AND CURETTAGE OF UTERUS    . FEMUR FRACTURE SURGERY Left 2013   "put 7" rod in it"  . FRACTURE SURGERY    . LAPAROSCOPY  08/22/2016   Procedure: LAPAROSCOPY DIAGNOSTIC;  Surgeon: [name], MD;  [facility];  Service: General;;  . MEDIAL PARTIAL KNEE REPLACEMENT Right 2005   "@ [facility]"  . PACEMAKER IMPLANT N/A 08/27/2017   Procedure: PACEMAKER IMPLANT;  Surgeon: Evans Lance, MD;  Location: [facility];  Service: Cardiovascular;  Laterality: N/A;  . ROUX-EN-Y GASTRIC BYPASS  2002   [place]  . SPLENECTOMY  2002  . TONSILLECTOMY  1944  . TUBAL LIGATION    . VAGINAL HYSTERECTOMY      Family History  Problem Relation Age of Onset  . Cancer Mother   . Alzheimer's disease Mother   . Heart disease Mother   . Arthritis Father   . Colon cancer Paternal Uncle 36   Social History   Tobacco Use  . Smoking status: Former Smoker    Packs/day: 0.50    Years: 25.00    Pack years: 12.50    Types: Cigarettes    Last attempt to quit: 02/26/1993    Years since quitting: 25.7  . Smokeless tobacco: Never Used  Substance Use Topics  . Alcohol use: No    Comment: 08/27/2017 "nothing since early 2000s"  . Drug use: No    Allergies  Allergen Reactions  . Codeine Nausea And Vomiting    Stomach pain also  No outpatient medications have been marked as taking for the 11/08/18 encounter (Office Visit) with Carole Civil, MD.    BP 103/72   Pulse 83   Ht 5\' 4"  (1.626 m)   Wt 190 lb (86.2 kg)   BMI 32.61 kg/m   Physical Exam Vitals signs reviewed.  Constitutional:      Appearance: Normal appearance. She is well-developed.  Neurological:     Mental Status: She is alert and oriented to person, place, and time.  Psychiatric:        Attention and Perception: Attention normal.        Mood and Affect: Mood and affect normal.        Speech: Speech normal.        Behavior: Behavior normal.        Thought Content: Thought content normal.        Judgment: Judgment normal.     Ortho Exam  Right knee only flexes about 90 degrees but that is chronic no pain no tenderness no instability muscle tone normal neurovascular exam is intact skin is clean  Left knee  is tender over the anterior patellar tendon inferior pole of patella there is no joint effusion or soft tissue swelling on the medial soft tissues.  Knee feels stable muscle tone is normal straight leg raises normal there are no skin abrasions  She has 3 corns on the bottom of her foot which are tender primarily in the middle 1 and she also has posterior tibial tendinitis and tenderness with swelling and pain there as well  [place] were done at [facility]  My independent reading of xrays:  X-rays of the knee negative  She had x-rays of the foot there is some osteopenia no fracture is seen  Encounter Diagnoses  Name Primary?  . Left foot pain Yes  . Contusion of left knee, initial encounter   . Posterior tibial tendon dysfunction (PTTD) of left lower extremity   . Callus of foot     PLAN: (Rx., injectx, surgery, frx, mri/ct) Patient will be seen by foot and ankle specialist for the foot  Start physical therapy for the contusion of the left knee  Follow-up in 6 weeks  No orders of the defined types were placed in this encounter.   Arther Abbott, MD  11/08/2018 3:29 PM

## 2018-11-08 NOTE — Patient Instructions (Signed)
   PLAN: (Rx., injectx, surgery, frx, mri/ct) Patient will be seen by foot and ankle specialist for the foot  Start physical therapy for the contusion of the left knee  Follow-up in 6 weeks

## 2018-11-12 ENCOUNTER — Ambulatory Visit: Payer: Medicare HMO | Admitting: Family

## 2018-11-15 ENCOUNTER — Other Ambulatory Visit: Payer: Self-pay | Admitting: Family

## 2018-11-15 ENCOUNTER — Encounter (INDEPENDENT_AMBULATORY_CARE_PROVIDER_SITE_OTHER): Payer: Self-pay | Admitting: Orthopedic Surgery

## 2018-11-15 ENCOUNTER — Ambulatory Visit (INDEPENDENT_AMBULATORY_CARE_PROVIDER_SITE_OTHER): Payer: Medicare HMO | Admitting: Physician Assistant

## 2018-11-15 VITALS — Ht 64.0 in | Wt 190.0 lb

## 2018-11-15 DIAGNOSIS — M79672 Pain in left foot: Secondary | ICD-10-CM

## 2018-11-15 DIAGNOSIS — G6289 Other specified polyneuropathies: Secondary | ICD-10-CM

## 2018-11-15 DIAGNOSIS — M26609 Unspecified temporomandibular joint disorder, unspecified side: Secondary | ICD-10-CM

## 2018-11-15 DIAGNOSIS — M6702 Short Achilles tendon (acquired), left ankle: Secondary | ICD-10-CM

## 2018-11-15 NOTE — Progress Notes (Signed)
Office Visit Note   Patient: Sabrina [name]           Date of Birth: [birth year]           MRN: [id number] Visit Date: 11/15/2018              Requested by: Carole Civil, MD [address] [place], [place] [address] PCP: Sharion Balloon, FNP  Chief Complaint  Patient presents with  . Left Foot - Pain    NP      HPI: The patient is a 73 year old woman who presents with complaint of left midfoot pain.  She reports she has a history of severe bilateral lower extremity peripheral neuropathy due to severe spine disease.  She reports that she has a history of multiple falls.  Remotely she had a hip fracture and had this fixated.  She reports that she is having severe pain over the foot whenever she tries to weight-bear.  She presented to her primary care doctor and had radiographs done of her left foot and these were reviewed today in the office with Dr. Sharol Given. The radiographs showed some evidence of avascular necrosis over the second metatarsal head but no evidence for osteomyelitis or other abnormality, the otherwise showed no osseous abnormality.  She reports that her primary care physician has written for her to have some physical therapy at [facility] as she has had a history of frequent falling.  We also discussed that she might need to consider walking with some type of an assistive device to help with this.     Assessment & Plan: Visit Diagnoses:  1. Short Achilles tendon (acquired), left ankle   2. Pain in left foot   3. Other polyneuropathy     Plan: The patient was instructed to work on Achilles stretching and was given an addendum to her prescription to take to physical therapy to add this to her overall physical therapy program.  We also recommended the patient utilize some stiff soled shoes to help offload the left foot.  Also recommended compression socks.  The patient does state that she has compression socks as well as some stiff  soled shoes and she will begin utilizing these.  The callus over the third metatarsal head was debrided and the patient tolerated this well.  There is no ulceration over this area.  She will follow-up here in 4 weeks or sooner should she have difficulties in the interim.  Follow-Up Instructions: Return in about 4 weeks (around 12/13/2018).   Ortho Exam  Patient is alert, oriented, no adenopathy, well-dressed, normal affect, normal respiratory effort. The patient is tender to palpation over the third metatarsal head to palpation.  There was callus over this area and it was debrided without difficulty.  There is no underlying ulcer.  The patient lacks approximately 5 degrees of dorsiflexion from neutral over the left ankle and her Achilles is very tight and she is tender to palpation over the distal Achilles insertion.  She has biphasic pulses by Doppler in the left foot but her toes are quite cool and have purple discoloration..  She has severe peripheral neuropathy.  She does have some edema of the lower extremity and hemosiderin staining.  Imaging: No results found. No images are attached to the encounter.  Labs: Lab Results  Component Value Date   HGBA1C 5.9 09/27/2018   HGBA1C 5.7 (H) 08/18/2016   HGBA1C 5.4 11/27/2014   ESRSEDRATE 10 09/26/2016   ESRSEDRATE  13 01/22/2016   CRP 0.2 (L) 09/26/2016   CRP 0.2 (L) 01/22/2016   REPTSTATUS 06/14/2016 FINAL 06/12/2016   CULT (A) 06/12/2016    20,000 COLONIES/mL ESCHERICHIA COLI Confirmed Extended Spectrum Beta-Lactamase Producer (ESBL)    LABORGA  09/26/2016    Three or more organisms present,each greater than 10,000 CFU/mL.These organisms,commonly found on external and internal genitalia,are considered to be colonizers.No further testing performed.      Lab Results  Component Value Date   ALBUMIN 3.9 06/29/2018   ALBUMIN 3.7 03/04/2018   ALBUMIN 4.2 07/14/2017    Body mass index is 32.61 kg/m.  Orders:  No orders of the  defined types were placed in this encounter.  No orders of the defined types were placed in this encounter.    Procedures: No procedures performed  Clinical Data: No additional findings.  ROS:  All other systems negative, except as noted in the HPI. Review of Systems  Objective: Vital Signs: Ht 5\' 4"  (1.626 m)   Wt 190 lb (86.2 kg)   BMI 32.61 kg/m   Specialty Comments:  No specialty comments available.  PMFS History: Patient Active Problem List   Diagnosis Date Noted  . Near syncope   . Hyperlipidemia 06/23/2016  . Colovesical fistula 06/12/2016  . Atrial fibrillation with RVR ([place]) 06/12/2016  . GERD (gastroesophageal reflux disease) 06/12/2016  . Fibromyalgia 06/12/2016  . Peripheral neuropathy 06/12/2016  . Atrial fibrillation ([place]) 06/12/2016  . S/P gastric bypass 01/22/2016  . S/P splenectomy 01/22/2016  . S/P cholecystectomy 01/22/2016  . Hx of small bowel obstruction 01/22/2016  . Asthma 10/19/2015  . Generalized anxiety disorder 10/19/2015  . Depression 10/19/2015  . Insomnia 10/19/2015  . Iron deficiency anemia 10/19/2015  . Vitamin D deficiency 11/29/2014  . Eczema 11/27/2014  . Benign essential HTN 09/25/2014  . Obesity (BMI 30-39.9) 09/25/2014  . Chronic diastolic heart failure ([place]) 09/25/2014  . Allergic rhinitis 02/28/2013   Past Medical History:  Diagnosis Date  . Anemia   . Ankle fracture, right    past hx. -"no surgery"  . Anxiety   . Asthma   . CHF (congestive heart failure) ([place]) 2009  . Chronic lower back pain   . Collagen vascular disease ([place])   . COPD (chronic obstructive pulmonary disease) ([place])   . Depression   . Fibromyalgia   . GERD (gastroesophageal reflux disease)   . Hyperlipidemia   . Hypertension   . Immature cataract of both eyes   . Myocardial infarction ([place])    "I've had a light one; don't know when it happened" (08/27/2017)  . Osteoarthritis   . Peripheral neuropathy    legs and feet  . Persistent atrial  fibrillation   . Tubular adenoma of colon     Family History  Problem Relation Age of Onset  . Cancer Mother   . Alzheimer's disease Mother   . Heart disease Mother   . Arthritis Father   . Colon cancer Paternal Uncle 13    Past Surgical History:  Procedure Laterality Date  . APPENDECTOMY    . AV NODE ABLATION N/A 08/27/2017   Procedure: AV NODE ABLATION;  Surgeon: Evans Lance, MD;  Location: [facility];  Service: Cardiovascular;  Laterality: N/A;  . CHOLECYSTECTOMY OPEN  1978  . DILATION AND CURETTAGE OF UTERUS    . FEMUR FRACTURE SURGERY Left 2013   "put 7" rod in it"  . FRACTURE SURGERY    . LAPAROSCOPY  08/22/2016   Procedure:  LAPAROSCOPY DIAGNOSTIC;  Surgeon: [name], MD;  [facility];  Service: General;;  . MEDIAL PARTIAL KNEE REPLACEMENT Right 2005   "@ [facility]"  . PACEMAKER IMPLANT N/A 08/27/2017   Procedure: PACEMAKER IMPLANT;  Surgeon: Evans Lance, MD;  Location: [facility];  Service: Cardiovascular;  Laterality: N/A;  . ROUX-EN-Y GASTRIC BYPASS  2002   [place]  . SPLENECTOMY  2002  . TONSILLECTOMY  1944  . TUBAL LIGATION    . VAGINAL HYSTERECTOMY     Social History   Occupational History  . Not on file  Tobacco Use  . Smoking status: Former Smoker    Packs/day: 0.50    Years: 25.00    Pack years: 12.50    Types: Cigarettes    Last attempt to quit: 02/26/1993    Years since quitting: 25.7  . Smokeless tobacco: Never Used  Substance and Sexual Activity  . Alcohol use: No    Comment: 08/27/2017 "nothing since early 2000s"  . Drug use: No  . Sexual activity: Not Currently    Birth control/protection: Surgical

## 2018-11-16 ENCOUNTER — Ambulatory Visit: Payer: Medicare HMO | Attending: Orthopedic Surgery | Admitting: Physical Therapy

## 2018-11-16 ENCOUNTER — Other Ambulatory Visit: Payer: Self-pay

## 2018-11-16 DIAGNOSIS — M25662 Stiffness of left knee, not elsewhere classified: Secondary | ICD-10-CM | POA: Insufficient documentation

## 2018-11-16 DIAGNOSIS — M25562 Pain in left knee: Secondary | ICD-10-CM | POA: Diagnosis not present

## 2018-11-16 NOTE — Therapy (Signed)
[facility] [place], [place], [address] Phone: [phone]   Fax:  [phone]  Physical Therapy Evaluation  Patient Details  Name: Sabrina [name] MRN: [id number] Date of Birth: [birth year] Referring Provider (PT): Arther Abbott MD   Encounter Date: 11/16/2018  PT End of Session - 11/16/18 1456    Visit Number  1    Number of Visits  12    Date for PT Re-Evaluation  02/14/19    Authorization Type  FOTO AT LEAST EVERY 5TH VISIT.  KX MODIFIER AFTER 15 VISITS.  PROGRESS NOTE AT 10TH VISIT.    PT Start Time  0145    PT Stop Time  0233    PT Time Calculation (min)  48 min    Activity Tolerance  Patient tolerated treatment well    Behavior During Therapy  [facility] for tasks assessed/performed       Past Medical History:  Diagnosis Date  . Anemia   . Ankle fracture, right    past hx. -"no surgery"  . Anxiety   . Asthma   . CHF (congestive heart failure) ([place]) 2009  . Chronic lower back pain   . Collagen vascular disease ([place])   . COPD (chronic obstructive pulmonary disease) ([place])   . Depression   . Fibromyalgia   . GERD (gastroesophageal reflux disease)   . Hyperlipidemia   . Hypertension   . Immature cataract of both eyes   . Myocardial infarction ([place])    "I've had a light one; don't know when it happened" (08/27/2017)  . Osteoarthritis   . Peripheral neuropathy    legs and feet  . Persistent atrial fibrillation   . Tubular adenoma of colon     Past Surgical History:  Procedure Laterality Date  . APPENDECTOMY    . AV NODE ABLATION N/A 08/27/2017   Procedure: AV NODE ABLATION;  Surgeon: Evans Lance, MD;  Location: [facility];  Service: Cardiovascular;  Laterality: N/A;  . CHOLECYSTECTOMY OPEN  1978  . DILATION AND CURETTAGE OF UTERUS    . FEMUR FRACTURE SURGERY Left 2013   "put 7" rod in it"  . FRACTURE SURGERY    . LAPAROSCOPY  08/22/2016   Procedure: LAPAROSCOPY DIAGNOSTIC;  Surgeon: [name], MD;  [facility];  Service: General;;  . MEDIAL PARTIAL KNEE REPLACEMENT Right 2005   "@ [facility]"  . PACEMAKER IMPLANT N/A 08/27/2017   Procedure: PACEMAKER IMPLANT;  Surgeon: Evans Lance, MD;  Location: [facility];  Service: Cardiovascular;  Laterality: N/A;  . ROUX-EN-Y GASTRIC BYPASS  2002   [place]  . SPLENECTOMY  2002  . TONSILLECTOMY  1944  . TUBAL LIGATION    . VAGINAL HYSTERECTOMY      There were no vitals filed for this visit.   Subjective Assessment - 11/16/18 1410    Subjective  The patient reports falling about 6 weeks ago on her right knee.  She rates her pain at a 6/10 today but her pain can rise to higher levels with increased walking.  Warm towels help decrease her pain.      Pertinent History  Pacemaker.  Left femoral ORIF, MI, COPD, Fibromyalgia.    How long can you walk comfortably?  Short community distances.    Patient Stated Goals  Get out of pain.    Currently in Pain?  Yes    Pain Score  6     Pain Location  Knee  Pain Orientation  Left    Pain Descriptors / Indicators  Aching;Throbbing    Pain Type  Acute pain    Pain Onset  More than a month ago    Pain Frequency  Constant    Aggravating Factors   See above.    Pain Relieving Factors  See above.         [facility] PT Assessment - 11/16/18 0001      Assessment   Medical Diagnosis  Contusion of left knee.    Referring Provider (PT)  Arther Abbott MD    Onset Date/Surgical Date  --   ~6 weeks.     Precautions   Precautions  Fall   PACEMAKER.     Restrictions   Weight Bearing Restrictions  No      Balance Screen   Has the patient fallen in the past 6 months  Yes    How many times?  --   3.   Has the patient had a decrease in activity level because of a fear of falling?   Yes    Is the patient reluctant to leave their home because of a fear of falling?   Yes      [place] residence      Prior Function    Level of Independence  Independent      Observation/Other Assessments   Focus on Therapeutic Outcomes (FOTO)   68% limtation.      Observation/Other Assessments-Edema    Edema  --   Localized in region of right Pes Anserine.     Posture/Postural Control   Posture Comments  Left knee genu valgum and patella sits laterally.      ROM / Strength   AROM / PROM / Strength  AROM;Strength      AROM   Overall AROM Comments  Left knee AROM:  0 to 110 degrees.  Left dorsiflexion with knee in extension to 4 degrees.      Strength   Overall Strength Comments  Left hip abduction= 4- to 4/5 and left knee 5/5.      Palpation   Palpation comment  Tender to palpation over left knee MPFL and Pes Anserine.      Ambulation/Gait   Gait Comments  Patient with slow and purposeful gait.  Recommend she use an assistive device at all times for safety.                Objective measurements completed on examination: See above findings.      [facility] Adult PT Treatment/Exercise - 11/16/18 0001      Modalities   Modalities  Vasopneumatic      Vasopneumatic   Number Minutes Vasopneumatic   15 minutes    Vasopnuematic Location   --   Left knee.   Vasopneumatic Pressure  Low               PT Short Term Goals - 11/16/18 1455      PT SHORT TERM GOAL #1   Title  STG's=LTG's.        PT Long Term Goals - 11/16/18 1455      PT LONG TERM GOAL #1   Title  Independent with a HEP.    Time  6    Period  Weeks    Status  New      PT LONG TERM GOAL #2   Title  Active left knee flexion to 120 degrees+ so the patient  can perform functional tasks and do so with pain not > 2-3/10.    Time  6    Period  Weeks    Status  New      PT LONG TERM GOAL #3   Title  Walk a community distance with left knee pain not > 2-3/10.    Time  6    Period  Weeks    Status  New             Plan - 11/16/18 1450    Clinical Impression Statement  The patient presents to OPT with a diagnosis of  a left knee contusion as the rsult of a fall approximately 6 weeks ago.  She has sonme loss of left knee flexion.  She was quite tender to palpation over her left MPFL and Pes Anserine.  Patient will benefit from skilled physical therapy intervention to address deficits and pain.    History and Personal Factors relevant to plan of care:  Pacemaker.  Left femoral ORIF, MI, COPD, Fibromyalgia.    Clinical Presentation  Evolving    Clinical Presentation due to:  Not improving.    Clinical Decision Making  Low    Rehab Potential  Excellent    PT Frequency  2x / week    PT Duration  6 weeks    PT Treatment/Interventions  ADLs/Self Care Home Management;Cryotherapy;Moist Heat;Therapeutic exercise;Therapeutic activities;Patient/family education;Manual techniques;Passive range of motion;Vasopneumatic Device    PT Next Visit Plan  Nustep, Rocker[facility], STW/M to left affected knee knee region.  Vasopneumatic.    Consulted and Agree with Plan of Care  Patient       Patient will benefit from skilled therapeutic intervention in order to improve the following deficits and impairments:  Decreased activity tolerance, Pain, Decreased strength, Decreased range of motion  Visit Diagnosis: Acute pain of left knee - Plan: PT plan of care cert/re-cert  Stiffness of left knee, not elsewhere classified - Plan: PT plan of care cert/re-cert     Problem List Patient Active Problem List   Diagnosis Date Noted  . Near syncope   . Hyperlipidemia 06/23/2016  . Colovesical fistula 06/12/2016  . Atrial fibrillation with RVR ([place]) 06/12/2016  . GERD (gastroesophageal reflux disease) 06/12/2016  . Fibromyalgia 06/12/2016  . Peripheral neuropathy 06/12/2016  . Atrial fibrillation ([place]) 06/12/2016  . S/P gastric bypass 01/22/2016  . S/P splenectomy 01/22/2016  . S/P cholecystectomy 01/22/2016  . Hx of small bowel obstruction 01/22/2016  . Asthma 10/19/2015  . Generalized anxiety disorder 10/19/2015  . Depression  10/19/2015  . Insomnia 10/19/2015  . Iron deficiency anemia 10/19/2015  . Vitamin D deficiency 11/29/2014  . Eczema 11/27/2014  . Benign essential HTN 09/25/2014  . Obesity (BMI 30-39.9) 09/25/2014  . Chronic diastolic heart failure ([name]) 09/25/2014  . Allergic rhinitis 02/28/2013    Sabrina [name], Sabrina [name] 11/16/2018, 2:58 PM  [facility] [address] [place], [place], [address] Phone: [phone]   Fax:  [phone]  Name: Sabrina [name] MRN: [id number] Date of Birth: 06-11-1946

## 2018-11-20 ENCOUNTER — Other Ambulatory Visit: Payer: Self-pay | Admitting: Family

## 2018-11-22 NOTE — Telephone Encounter (Signed)
Last Vit D 03/04/18  27.1

## 2018-11-25 ENCOUNTER — Ambulatory Visit: Payer: Medicare HMO | Admitting: *Deleted

## 2018-11-25 ENCOUNTER — Ambulatory Visit (INDEPENDENT_AMBULATORY_CARE_PROVIDER_SITE_OTHER): Payer: Medicare HMO | Admitting: *Deleted

## 2018-11-25 DIAGNOSIS — I442 Atrioventricular block, complete: Secondary | ICD-10-CM | POA: Diagnosis not present

## 2018-11-25 LAB — CUP PACEART REMOTE DEVICE CHECK
Battery Remaining Longevity: 38 mo
Battery Voltage: 2.93 V
Brady Statistic AP VP Percent: 0.53 %
Brady Statistic AS VP Percent: 0 %
Brady Statistic AS VS Percent: 0.05 %
Brady Statistic RA Percent Paced: 99.98 %
Brady Statistic RV Percent Paced: 0.53 %
Date Time Interrogation Session: 20200305043721
Implantable Lead Implant Date: 20181206
Implantable Lead Implant Date: 20181206
Implantable Lead Location: 753860
Implantable Lead Location: 753860
Implantable Lead Model: 3830
Implantable Lead Model: 5076
Implantable Pulse Generator Implant Date: 20181206
Lead Channel Impedance Value: 304 Ohm
Lead Channel Impedance Value: 418 Ohm
Lead Channel Impedance Value: 551 Ohm
Lead Channel Sensing Intrinsic Amplitude: 4.75 mV
Lead Channel Sensing Intrinsic Amplitude: 4.75 mV
Lead Channel Sensing Intrinsic Amplitude: 9.75 mV
Lead Channel Setting Pacing Amplitude: 2 V
Lead Channel Setting Pacing Pulse Width: 0.3 ms
Lead Channel Setting Sensing Sensitivity: 2 mV
MDC IDC MSMT LEADCHNL RV IMPEDANCE VALUE: 361 Ohm
MDC IDC MSMT LEADCHNL RV SENSING INTR AMPL: 9.75 mV
MDC IDC SET LEADCHNL RA PACING AMPLITUDE: 2.5 V
MDC IDC STAT BRADY AP VS PERCENT: 99.42 %

## 2018-11-30 ENCOUNTER — Ambulatory Visit: Payer: Medicare HMO | Attending: Orthopedic Surgery | Admitting: Physical Therapy

## 2018-11-30 DIAGNOSIS — M25662 Stiffness of left knee, not elsewhere classified: Secondary | ICD-10-CM | POA: Diagnosis not present

## 2018-11-30 DIAGNOSIS — M25562 Pain in left knee: Secondary | ICD-10-CM | POA: Insufficient documentation

## 2018-11-30 NOTE — Therapy (Signed)
[facility] [place], [place], [address] Phone: [phone]   Fax:  [phone]  Physical Therapy Treatment  Patient Details  Name: Sabrina [name] MRN: [id number] Date of Birth: 1946/02/27 Referring Provider (PT): Arther Abbott MD   Encounter Date: 11/30/2018  PT End of Session - 11/30/18 1552    Visit Number  2    Number of Visits  12    Date for PT Re-Evaluation  02/14/19    Authorization Type  FOTO AT LEAST EVERY 5TH VISIT.  KX MODIFIER AFTER 15 VISITS.  PROGRESS NOTE AT 10TH VISIT.    PT Start Time  0231    PT Stop Time  0322    PT Time Calculation (min)  51 min    Activity Tolerance  Patient tolerated treatment well    Behavior During Therapy  WFL for tasks assessed/performed       Past Medical History:  Diagnosis Date  . Anemia   . Ankle fracture, right    past hx. -"no surgery"  . Anxiety   . Asthma   . CHF (congestive heart failure) ([place]) 2009  . Chronic lower back pain   . Collagen vascular disease ([place])   . COPD (chronic obstructive pulmonary disease) ([place])   . Depression   . Fibromyalgia   . GERD (gastroesophageal reflux disease)   . Hyperlipidemia   . Hypertension   . Immature cataract of both eyes   . Myocardial infarction ([place])    "I've had a light one; don't know when it happened" (08/27/2017)  . Osteoarthritis   . Peripheral neuropathy    legs and feet  . Persistent atrial fibrillation   . Tubular adenoma of colon     Past Surgical History:  Procedure Laterality Date  . APPENDECTOMY    . AV NODE ABLATION N/A 08/27/2017   Procedure: AV NODE ABLATION;  Surgeon: Evans Lance, MD;  Location: [facility];  Service: Cardiovascular;  Laterality: N/A;  . CHOLECYSTECTOMY OPEN  1978  . DILATION AND CURETTAGE OF UTERUS    . FEMUR FRACTURE SURGERY Left 2013   "put 7" rod in it"  . FRACTURE SURGERY    . LAPAROSCOPY  08/22/2016   Procedure: LAPAROSCOPY DIAGNOSTIC;  Surgeon: [name], MD;  Location: [facility];  Service: General;;  . MEDIAL PARTIAL KNEE REPLACEMENT Right 2005   "@ [facility]"  . PACEMAKER IMPLANT N/A 08/27/2017   Procedure: PACEMAKER IMPLANT;  Surgeon: Evans Lance, MD;  Location: [facility];  Service: Cardiovascular;  Laterality: N/A;  . ROUX-EN-Y GASTRIC BYPASS  2002   [place]  . SPLENECTOMY  2002  . TONSILLECTOMY  1944  . TUBAL LIGATION    . VAGINAL HYSTERECTOMY      There were no vitals filed for this visit.  Subjective Assessment - 11/30/18 1546    Subjective  No new complaints.    Patient Stated Goals  Get out of pain.    Currently in Pain?  Yes    Pain Score  6     Pain Location  Knee    Pain Orientation  Left    Pain Descriptors / Indicators  Aching;Throbbing    Pain Type  Acute pain    Pain Onset  More than a month ago                       [facility] Adult PT Treatment/Exercise - 11/30/18 0001  Exercises   Exercises  Knee/Hip      Knee/Hip Exercises: Aerobic   Nustep  Nustep level with a cadence ~ 60 steps/min x 10 minutes.      Modalities   Modalities  Vasopneumatic      Vasopneumatic   Number Minutes Vasopneumatic   20 minutes    Vasopnuematic Location   --   Left knee.   Vasopneumatic Pressure  Medium      Manual Therapy   Manual Therapy  Soft tissue mobilization    Soft tissue mobilization  Gentle STW/M x 13 minutes to patient's left medial knee (MPFL) and Pes Anserine including very gentle IASTM.               PT Short Term Goals - 11/16/18 1455      PT SHORT TERM GOAL #1   Title  STG's=LTG's.        PT Long Term Goals - 11/16/18 1455      PT LONG TERM GOAL #1   Title  Independent with a HEP.    Time  6    Period  Weeks    Status  New      PT LONG TERM GOAL #2   Title  Active left knee flexion to 120 degrees+ so the patient can perform functional tasks and do so with pain not > 2-3/10.    Time  6    Period  Weeks    Status  New      PT LONG  TERM GOAL #3   Title  Walk a community distance with left knee pain not > 2-3/10.    Time  6    Period  Weeks    Status  New            Plan - 11/30/18 1550    Clinical Impression Statement  Patient did great with treatment today.  However, she is still palpably tender over her left MPFL and Pes Anserine region.    PT Treatment/Interventions  ADLs/Self Care Home Management;Cryotherapy;Moist Heat;Therapeutic exercise;Therapeutic activities;Patient/family education;Manual techniques;Passive range of motion;Vasopneumatic Device    PT Next Visit Plan  Nustep, Rockerboard, STW/M to left affected knee knee region.  Vasopneumatic.    Consulted and Agree with Plan of Care  Patient       Patient will benefit from skilled therapeutic intervention in order to improve the following deficits and impairments:  Decreased activity tolerance, Pain, Decreased strength, Decreased range of motion  Visit Diagnosis: Acute pain of left knee  Stiffness of left knee, not elsewhere classified     Problem List Patient Active Problem List   Diagnosis Date Noted  . Near syncope   . Hyperlipidemia 06/23/2016  . Colovesical fistula 06/12/2016  . Atrial fibrillation with RVR ([place]) 06/12/2016  . GERD (gastroesophageal reflux disease) 06/12/2016  . Fibromyalgia 06/12/2016  . Peripheral neuropathy 06/12/2016  . Atrial fibrillation ([place]) 06/12/2016  . S/P gastric bypass 01/22/2016  . S/P splenectomy 01/22/2016  . S/P cholecystectomy 01/22/2016  . Hx of small bowel obstruction 01/22/2016  . Asthma 10/19/2015  . Generalized anxiety disorder 10/19/2015  . Depression 10/19/2015  . Insomnia 10/19/2015  . Iron deficiency anemia 10/19/2015  . Vitamin D deficiency 11/29/2014  . Eczema 11/27/2014  . Benign essential HTN 09/25/2014  . Obesity (BMI 30-39.9) 09/25/2014  . Chronic diastolic heart failure ([place]) 09/25/2014  . Allergic rhinitis 02/28/2013    Abbie Jablon, [place] MPT 11/30/2018, 3:54 PM  [facility] [place]  [place], [place], [address] Phone: [phone]   Fax:  [phone]  Name: ATLEIGH [name] MRN: [id number] Date of Birth: 1945/12/11

## 2018-12-02 ENCOUNTER — Telehealth: Payer: Self-pay | Admitting: Family

## 2018-12-02 ENCOUNTER — Other Ambulatory Visit: Payer: Self-pay

## 2018-12-02 ENCOUNTER — Ambulatory Visit: Payer: Medicare HMO | Admitting: Physical Therapy

## 2018-12-02 DIAGNOSIS — M25562 Pain in left knee: Secondary | ICD-10-CM

## 2018-12-02 DIAGNOSIS — M25662 Stiffness of left knee, not elsewhere classified: Secondary | ICD-10-CM

## 2018-12-02 MED ORDER — AZITHROMYCIN 250 MG PO TABS: ORAL_TABLET | ORAL | 0 refills | Status: DC

## 2018-12-02 NOTE — Telephone Encounter (Signed)
Pt aware.

## 2018-12-02 NOTE — Telephone Encounter (Signed)
Zpak Prescription sent to pharmacy   

## 2018-12-02 NOTE — Telephone Encounter (Signed)
Patient walked in from PT next door. They told her the infection in her leg was still there. She said they told her to stop the prednisone she was on and get an abx called in, because the prednisone makes her nervous and she has a pacemaker. If call in abx please [facility] to [facility] in [facility]

## 2018-12-02 NOTE — Therapy (Signed)
[facility] [place], [place], [address] Phone: [phone]   Fax:  [phone]  Physical Therapy Treatment  Patient Details  Name: Sabrina [name] MRN: [id number] Date of Birth: 1946-01-25 Referring Provider (PT): Arther Abbott MD   Encounter Date: 12/02/2018  PT End of Session - 12/02/18 1513    Visit Number  3    Number of Visits  12    Date for PT Re-Evaluation  02/14/19    Authorization Type  FOTO AT LEAST EVERY 5TH VISIT.  KX MODIFIER AFTER 15 VISITS.  PROGRESS NOTE AT 10TH VISIT.    PT Start Time  1430    PT Stop Time  1525    PT Time Calculation (min)  55 min    Activity Tolerance  Patient tolerated treatment well    Behavior During Therapy  WFL for tasks assessed/performed       Past Medical History:  Diagnosis Date  . Anemia   . Ankle fracture, right    past hx. -"no surgery"  . Anxiety   . Asthma   . CHF (congestive heart failure) ([place]) 2009  . Chronic lower back pain   . Collagen vascular disease ([place])   . COPD (chronic obstructive pulmonary disease) ([place])   . Depression   . Fibromyalgia   . GERD (gastroesophageal reflux disease)   . Hyperlipidemia   . Hypertension   . Immature cataract of both eyes   . Myocardial infarction ([place])    "I've had a light one; don't know when it happened" (08/27/2017)  . Osteoarthritis   . Peripheral neuropathy    legs and feet  . Persistent atrial fibrillation   . Tubular adenoma of colon     Past Surgical History:  Procedure Laterality Date  . APPENDECTOMY    . AV NODE ABLATION N/A 08/27/2017   Procedure: AV NODE ABLATION;  Surgeon: Evans Lance, MD;  Location: [facility];  Service: Cardiovascular;  Laterality: N/A;  . CHOLECYSTECTOMY OPEN  1978  . DILATION AND CURETTAGE OF UTERUS    . FEMUR FRACTURE SURGERY Left 2013   "put 7" rod in it"  . FRACTURE SURGERY    . LAPAROSCOPY  08/22/2016   Procedure: LAPAROSCOPY DIAGNOSTIC;  Surgeon: [name], MD;  [facility];  Service: General;;  . MEDIAL PARTIAL KNEE REPLACEMENT Right 2005   "@ [facility]"  . PACEMAKER IMPLANT N/A 08/27/2017   Procedure: PACEMAKER IMPLANT;  Surgeon: Evans Lance, MD;  Location: [facility];  Service: Cardiovascular;  Laterality: N/A;  . ROUX-EN-Y GASTRIC BYPASS  2002   [place]  . SPLENECTOMY  2002  . TONSILLECTOMY  1944  . TUBAL LIGATION    . VAGINAL HYSTERECTOMY      There were no vitals filed for this visit.  Subjective Assessment - 12/02/18 1435    Subjective  Did ok after last treatment yet increased swelling in knee today, more weakness feeling than pain    Pertinent History  Pacemaker.  Left femoral ORIF, MI, COPD, Fibromyalgia.    How long can you walk comfortably?  Short community distances.    Patient Stated Goals  Get out of pain.    Currently in Pain?  Yes    Pain Score  3     Pain Location  Knee    Pain Orientation  Left    Pain Descriptors / Indicators  Other (Comment)   weakness   Pain Type  Acute  pain    Pain Onset  More than a month ago    Pain Frequency  Constant    Aggravating Factors   increased activity    Pain Relieving Factors  rest                       [facility] Adult PT Treatment/Exercise - 12/02/18 0001      Exercises   Exercises  Knee/Hip      Knee/Hip Exercises: Aerobic   Nustep  Nustep level with a cadence ~ 60 steps/min x 12 minutes.      Knee/Hip Exercises: Supine   Quad Sets  Strengthening;Left;20 reps    Short Arc Quad Sets  Strengthening;Left;3 sets;10 reps    Straight Leg Raises  Strengthening;Left;1 set;10 reps    Other Supine Knee/Hip Exercises  ball for ad squeeze 5sec x 42min      Vasopneumatic   Number Minutes Vasopneumatic   20 minutes    Vasopnuematic Location   Knee    Vasopneumatic Pressure  Medium      Manual Therapy   Manual Therapy  Soft tissue mobilization    Soft tissue mobilization  Gentle STW/M  to patient's left medial knee (MPFL) and  Pes Anserine                PT Short Term Goals - 11/16/18 1455      PT SHORT TERM GOAL #1   Title  STG's=LTG's.        PT Long Term Goals - 12/02/18 1445      PT LONG TERM GOAL #1   Title  Independent with a HEP.    Time  6    Period  Weeks    Status  On-going      PT LONG TERM GOAL #2   Title  Active left knee flexion to 120 degrees+ so the patient can perform functional tasks and do so with pain not > 2-3/10.    Time  6    Period  Weeks    Status  On-going      PT LONG TERM GOAL #3   Title  Walk a community distance with left knee pain not > 2-3/10.    Time  6    Period  Weeks    Status  On-going            Plan - 12/02/18 1514    Clinical Impression Statement  Patient tolerated treatment fairly well. Patient reported more weakness than pain today with increased swelling. Patient able to progress low level strengtheing exercises followed by manual STW and VASO. Patient current goals ongoing.     Rehab Potential  Excellent    PT Frequency  2x / week    PT Duration  6 weeks    PT Treatment/Interventions  ADLs/Self Care Home Management;Cryotherapy;Moist Heat;Therapeutic exercise;Therapeutic activities;Patient/family education;Manual techniques;Passive range of motion;Vasopneumatic Device    PT Next Visit Plan  cont [place] for gentle ther ex/ STW/M to left affected knee knee region.  Vasopneumatic.    Consulted and Agree with Plan of Care  Patient       Patient will benefit from skilled therapeutic intervention in order to improve the following deficits and impairments:  Decreased activity tolerance, Pain, Decreased strength, Decreased range of motion  Visit Diagnosis: Acute pain of left knee  Stiffness of left knee, not elsewhere classified     Problem List Patient Active Problem List   Diagnosis Date Noted  . Near  syncope   . Hyperlipidemia 06/23/2016  . Colovesical fistula 06/12/2016  . Atrial fibrillation with RVR ([place]) 06/12/2016  . GERD  (gastroesophageal reflux disease) 06/12/2016  . Fibromyalgia 06/12/2016  . Peripheral neuropathy 06/12/2016  . Atrial fibrillation ([place]) 06/12/2016  . S/P gastric bypass 01/22/2016  . S/P splenectomy 01/22/2016  . S/P cholecystectomy 01/22/2016  . Hx of small bowel obstruction 01/22/2016  . Asthma 10/19/2015  . Generalized anxiety disorder 10/19/2015  . Depression 10/19/2015  . Insomnia 10/19/2015  . Iron deficiency anemia 10/19/2015  . Vitamin D deficiency 11/29/2014  . Eczema 11/27/2014  . Benign essential HTN 09/25/2014  . Obesity (BMI 30-39.9) 09/25/2014  . Chronic diastolic heart failure ([place]) 09/25/2014  . Allergic rhinitis 02/28/2013    Fong Mccarry P, PTA 12/02/2018, 3:41 PM  [facility] [place], [place], [address] Phone: [phone]   Fax:  [phone]  Name: KAILYNN SATTERLY MRN: [id number] Date of Birth: 23-Jun-1946

## 2018-12-03 ENCOUNTER — Encounter: Payer: Self-pay | Admitting: Cardiology

## 2018-12-03 NOTE — Progress Notes (Signed)
Remote pacemaker transmission.   

## 2018-12-07 ENCOUNTER — Other Ambulatory Visit: Payer: Self-pay

## 2018-12-07 ENCOUNTER — Ambulatory Visit: Payer: Medicare HMO | Admitting: Physical Therapy

## 2018-12-07 DIAGNOSIS — M25562 Pain in left knee: Secondary | ICD-10-CM | POA: Diagnosis not present

## 2018-12-07 DIAGNOSIS — M25662 Stiffness of left knee, not elsewhere classified: Secondary | ICD-10-CM | POA: Diagnosis not present

## 2018-12-07 NOTE — Therapy (Signed)
[facility] , [place], [address] Phone: [phone]   Fax:  [phone]  Physical Therapy Treatment  Patient Details  Name: Sabrina [name] MRN: [id number] Date of Birth: 21-Apr-1946 Referring Provider (PT): Arther Abbott MD   Encounter Date: 12/07/2018  PT End of Session - 12/07/18 1511    Visit Number  4    Number of Visits  12    Date for PT Re-Evaluation  02/14/19    Authorization Type  FOTO AT LEAST EVERY 5TH VISIT.  KX MODIFIER AFTER 15 VISITS.  PROGRESS NOTE AT 10TH VISIT.    PT Start Time  0211    PT Stop Time  0259    PT Time Calculation (min)  48 min    Activity Tolerance  Patient tolerated treatment well    Behavior During Therapy  [facility] for tasks assessed/performed       Past Medical History:  Diagnosis Date  . Anemia   . Ankle fracture, right    past hx. -"no surgery"  . Anxiety   . Asthma   . CHF (congestive heart failure) ([place]) 2009  . Chronic lower back pain   . Collagen vascular disease ([place])   . COPD (chronic obstructive pulmonary disease) ([place])   . Depression   . Fibromyalgia   . GERD (gastroesophageal reflux disease)   . Hyperlipidemia   . Hypertension   . Immature cataract of both eyes   . Myocardial infarction ([place])    "I've had a light one; don't know when it happened" (08/27/2017)  . Osteoarthritis   . Peripheral neuropathy    legs and feet  . Persistent atrial fibrillation   . Tubular adenoma of colon     Past Surgical History:  Procedure Laterality Date  . APPENDECTOMY    . AV NODE ABLATION N/A 08/27/2017   Procedure: AV NODE ABLATION;  Surgeon: Evans Lance, MD;  Location: [place] [facility];  Service: Cardiovascular;  Laterality: N/A;  . CHOLECYSTECTOMY OPEN  1978  . DILATION AND CURETTAGE OF UTERUS    . FEMUR FRACTURE SURGERY Left 2013   "put 7" rod in it"  . FRACTURE SURGERY    . LAPAROSCOPY  08/22/2016   Procedure: LAPAROSCOPY DIAGNOSTIC;  Surgeon: [name], MD;  [facility];  Service: General;;  . MEDIAL PARTIAL KNEE REPLACEMENT Right 2005   "@ [facility]"  . PACEMAKER IMPLANT N/A 08/27/2017   Procedure: PACEMAKER IMPLANT;  Surgeon: Evans Lance, MD;  Location: [place] [facility];  Service: Cardiovascular;  Laterality: N/A;  . ROUX-EN-Y GASTRIC BYPASS  2002   [place]  . SPLENECTOMY  2002  . TONSILLECTOMY  1944  . TUBAL LIGATION    . VAGINAL HYSTERECTOMY      There were no vitals filed for this visit.  Subjective Assessment - 12/07/18 1512    Subjective  My knee feels much better.    Pertinent History  Pacemaker.  Left femoral ORIF, MI, COPD, Fibromyalgia.    How long can you walk comfortably?  Short community distances.    Patient Stated Goals  Get out of pain.    Currently in Pain?  Yes    Pain Score  1     Pain Location  Knee    Pain Orientation  Left    Pain Descriptors / Indicators  Dull    Pain Type  Acute pain    Pain Onset  More than a month ago  [facility] Adult PT Treatment/Exercise - 12/07/18 0001      Exercises   Exercises  Knee/Hip      Knee/Hip Exercises: Aerobic   Nustep  Level 4 x 17 minutes.      Modalities   Modalities  Vasopneumatic      Vasopneumatic   Number Minutes Vasopneumatic   20 minutes    Vasopnuematic Location   --   Left knee.   Vasopneumatic Pressure  Medium      Manual Therapy   Manual Therapy  Soft tissue mobilization    Soft tissue mobilization  STW/M x 6 minutes to left medial knee region.               PT Short Term Goals - 11/16/18 1455      PT SHORT TERM GOAL #1   Title  STG's=LTG's.        PT Long Term Goals - 12/02/18 1445      PT LONG TERM GOAL #1   Title  Independent with a HEP.    Time  6    Period  Weeks    Status  On-going      PT LONG TERM GOAL #2   Title  Active left knee flexion to 120 degrees+ so the patient can perform functional tasks and do so with pain not > 2-3/10.    Time  6     Period  Weeks    Status  On-going      PT LONG TERM GOAL #3   Title  Walk a community distance with left knee pain not > 2-3/10.    Time  6    Period  Weeks    Status  On-going            Plan - 12/07/18 1515    Clinical Impression Statement  Patient doing very well and had no reported pain after treatment.  She was much less palpably tender over her left MPPL and Pes Anserine.    PT Treatment/Interventions  ADLs/Self Care Home Management;Cryotherapy;Moist Heat;Therapeutic exercise;Therapeutic activities;Patient/family education;Manual techniques;Passive range of motion;Vasopneumatic Device    PT Next Visit Plan  cont [place] for gentle ther ex/ STW/M to left affected knee knee region.  Vasopneumatic.    Consulted and Agree with Plan of Care  Patient       Patient will benefit from skilled therapeutic intervention in order to improve the following deficits and impairments:  Decreased activity tolerance, Pain, Decreased strength, Decreased range of motion  Visit Diagnosis: Acute pain of left knee  Stiffness of left knee, not elsewhere classified     Problem List Patient Active Problem List   Diagnosis Date Noted  . Near syncope   . Hyperlipidemia 06/23/2016  . Colovesical fistula 06/12/2016  . Atrial fibrillation with RVR ([place]) 06/12/2016  . GERD (gastroesophageal reflux disease) 06/12/2016  . Fibromyalgia 06/12/2016  . Peripheral neuropathy 06/12/2016  . Atrial fibrillation ([place]) 06/12/2016  . S/P gastric bypass 01/22/2016  . S/P splenectomy 01/22/2016  . S/P cholecystectomy 01/22/2016  . Hx of small bowel obstruction 01/22/2016  . Asthma 10/19/2015  . Generalized anxiety disorder 10/19/2015  . Depression 10/19/2015  . Insomnia 10/19/2015  . Iron deficiency anemia 10/19/2015  . Vitamin D deficiency 11/29/2014  . Eczema 11/27/2014  . Benign essential HTN 09/25/2014  . Obesity (BMI 30-39.9) 09/25/2014  . Chronic diastolic heart failure ([place]) 09/25/2014  .  Allergic rhinitis 02/28/2013    [name], [place] MPT 12/07/2018, 3:18 PM  [facility] [place], [place], [address] Phone: [phone]   Fax:  [phone]  Name: Sabrina [name] MRN: [id number] Date of Birth: [birth year]

## 2018-12-09 ENCOUNTER — Other Ambulatory Visit: Payer: Self-pay

## 2018-12-09 ENCOUNTER — Encounter: Payer: Self-pay | Admitting: Physical Therapy

## 2018-12-09 ENCOUNTER — Ambulatory Visit: Payer: Medicare HMO | Admitting: Physical Therapy

## 2018-12-09 DIAGNOSIS — M25662 Stiffness of left knee, not elsewhere classified: Secondary | ICD-10-CM

## 2018-12-09 DIAGNOSIS — M25562 Pain in left knee: Secondary | ICD-10-CM

## 2018-12-09 NOTE — Therapy (Signed)
[facility] [place], [place], [address] Phone: [phone]   Fax:  [phone]  Physical Therapy Treatment  Patient Details  Name: Sabrina [name] MRN: [id number] Date of Birth: 12-24-1945 Referring Provider (PT): Arther Abbott MD   Encounter Date: 12/09/2018  PT End of Session - 12/09/18 1547    Visit Number  5    Number of Visits  12    Date for PT Re-Evaluation  02/14/19    Authorization Type  FOTO AT LEAST EVERY 5TH VISIT.  KX MODIFIER AFTER 15 VISITS.  PROGRESS NOTE AT 10TH VISIT.    PT Start Time  0230    Activity Tolerance  Patient tolerated treatment well    Behavior During Therapy  [facility] for tasks assessed/performed       Past Medical History:  Diagnosis Date  . Anemia   . Ankle fracture, right    past hx. -"no surgery"  . Anxiety   . Asthma   . CHF (congestive heart failure) ([place]) 2009  . Chronic lower back pain   . Collagen vascular disease ([place])   . COPD (chronic obstructive pulmonary disease) ([place])   . Depression   . Fibromyalgia   . GERD (gastroesophageal reflux disease)   . Hyperlipidemia   . Hypertension   . Immature cataract of both eyes   . Myocardial infarction ([place])    "I've had a light one; don't know when it happened" (08/27/2017)  . Osteoarthritis   . Peripheral neuropathy    legs and feet  . Persistent atrial fibrillation   . Tubular adenoma of colon     Past Surgical History:  Procedure Laterality Date  . APPENDECTOMY    . AV NODE ABLATION N/A 08/27/2017   Procedure: AV NODE ABLATION;  Surgeon: [facility] Lance, MD;  Location: [facility];  Service: Cardiovascular;  Laterality: N/A;  . CHOLECYSTECTOMY OPEN  1978  . DILATION AND CURETTAGE OF UTERUS    . FEMUR FRACTURE SURGERY Left 2013   "put 7" rod in it"  . FRACTURE SURGERY    . LAPAROSCOPY  08/22/2016   Procedure: LAPAROSCOPY DIAGNOSTIC;  Surgeon: [name], MD;  Location: [facility];  Service: General;;  . MEDIAL  PARTIAL KNEE REPLACEMENT Right 2005   "@ [facility]"  . PACEMAKER IMPLANT N/A 08/27/2017   Procedure: PACEMAKER IMPLANT;  Surgeon: [facility] Lance, MD;  Location: [facility];  Service: Cardiovascular;  Laterality: N/A;  . ROUX-EN-Y GASTRIC BYPASS  2002   [place]  . SPLENECTOMY  2002  . TONSILLECTOMY  1944  . TUBAL LIGATION    . VAGINAL HYSTERECTOMY      There were no vitals filed for this visit.  Subjective Assessment - 12/09/18 1545    Subjective  Patient very happy with progress.    Pertinent History  Pacemaker.  Left femoral ORIF, MI, COPD, Fibromyalgia.    Currently in Pain?  Yes    Pain Score  2     Pain Location  Knee    Pain Orientation  Left    Pain Descriptors / Indicators  Dull    Pain Onset  More than a month ago                       [facility] Adult PT Treatment/Exercise - 12/09/18 0001      Exercises   Exercises  Knee/Hip      Knee/Hip Exercises: Aerobic   Nustep  Level 5 x 20 minutes.      Knee/Hip Exercises: Standing   Lateral Step Up Limitations  1 minute    Forward Step Up Limitations  4 inch x 2 minutes (left foot lead).    Other Standing Knee Exercises  Standing marches with 1# ankle weight on left in parallel bars x 1 minutes.      Knee/Hip Exercises: Supine   Other Supine Knee/Hip Exercises  SLR and sdly hip abduction(from right adly position)      Modalities   Modalities  Vasopneumatic      Vasopneumatic   Number Minutes Vasopneumatic   20 minutes    Vasopnuematic Location   --   Left knee.   Vasopneumatic Pressure  Medium               PT Short Term Goals - 11/16/18 1455      PT SHORT TERM GOAL #1   Title  STG's=LTG's.        PT Long Term Goals - 12/02/18 1445      PT LONG TERM GOAL #1   Title  Independent with a HEP.    Time  6    Period  Weeks    Status  On-going      PT LONG TERM GOAL #2   Title  Active left knee flexion to 120 degrees+ so the patient can perform functional tasks  and do so with pain not > 2-3/10.    Time  6    Period  Weeks    Status  On-going      PT LONG TERM GOAL #3   Title  Walk a community distance with left knee pain not > 2-3/10.    Time  6    Period  Weeks    Status  On-going            Plan - 12/09/18 1613    Clinical Impression Statement  The patient did great with the addition of ther ex today.  Her FOTO score has improved nicely.    PT Treatment/Interventions  ADLs/Self Care Home Management;Cryotherapy;Moist Heat;Therapeutic exercise;Therapeutic activities;Patient/family education;Manual techniques;Passive range of motion;Vasopneumatic Device    PT Next Visit Plan  cont [place] for gentle ther ex/ STW/M to left affected knee knee region.  Vasopneumatic.    Consulted and Agree with Plan of Care  Patient       Patient will benefit from skilled therapeutic intervention in order to improve the following deficits and impairments:  Decreased activity tolerance, Pain, Decreased strength, Decreased range of motion  Visit Diagnosis: Acute pain of left knee  Stiffness of left knee, not elsewhere classified     Problem List Patient Active Problem List   Diagnosis Date Noted  . Near syncope   . Hyperlipidemia 06/23/2016  . Colovesical fistula 06/12/2016  . Atrial fibrillation with RVR ([place]) 06/12/2016  . GERD (gastroesophageal reflux disease) 06/12/2016  . Fibromyalgia 06/12/2016  . Peripheral neuropathy 06/12/2016  . Atrial fibrillation ([place]) 06/12/2016  . S/P gastric bypass 01/22/2016  . S/P splenectomy 01/22/2016  . S/P cholecystectomy 01/22/2016  . Hx of small bowel obstruction 01/22/2016  . Asthma 10/19/2015  . Generalized anxiety disorder 10/19/2015  . Depression 10/19/2015  . Insomnia 10/19/2015  . Iron deficiency anemia 10/19/2015  . Vitamin D deficiency 11/29/2014  . Eczema 11/27/2014  . Benign essential HTN 09/25/2014  . Obesity (BMI 30-39.9) 09/25/2014  . Chronic diastolic heart failure ([place]) 09/25/2014   . Allergic rhinitis 02/28/2013  Dorris Pierre, [place] MPT 12/09/2018, 4:15 PM  [facility] [address] [place], [place], [address] Phone: [phone]   Fax:  [phone]  Name: Sabrina [name] MRN: [id number] Date of Birth: 1946-06-03

## 2018-12-14 ENCOUNTER — Ambulatory Visit: Payer: Medicare HMO | Admitting: Physical Therapy

## 2018-12-16 ENCOUNTER — Other Ambulatory Visit: Payer: Self-pay | Admitting: Family

## 2018-12-16 ENCOUNTER — Encounter: Payer: Medicare HMO | Admitting: Physical Therapy

## 2018-12-20 ENCOUNTER — Telehealth: Payer: Self-pay | Admitting: Orthopedic Surgery

## 2018-12-20 ENCOUNTER — Other Ambulatory Visit: Payer: Self-pay

## 2018-12-20 ENCOUNTER — Ambulatory Visit: Payer: Medicare HMO | Admitting: Orthopedic Surgery

## 2018-12-20 NOTE — Telephone Encounter (Signed)
Patient answered at this number after lunch [phone]), I explained that you had tried to contact her for the virtual office visit and it was the wrong number. Stated she didn't understand that because I just called her and it was the correct number. When do you want her to come in or try another virtual office visit. Please advise.

## 2018-12-22 ENCOUNTER — Telehealth (INDEPENDENT_AMBULATORY_CARE_PROVIDER_SITE_OTHER): Payer: Medicare HMO | Admitting: Orthopedic Surgery

## 2018-12-22 ENCOUNTER — Other Ambulatory Visit: Payer: Self-pay

## 2018-12-22 DIAGNOSIS — S8002XD Contusion of left knee, subsequent encounter: Secondary | ICD-10-CM | POA: Diagnosis not present

## 2018-12-22 DIAGNOSIS — M79672 Pain in left foot: Secondary | ICD-10-CM

## 2018-12-22 DIAGNOSIS — L84 Corns and callosities: Secondary | ICD-10-CM

## 2018-12-22 DIAGNOSIS — M76822 Posterior tibial tendinitis, left leg: Secondary | ICD-10-CM | POA: Diagnosis not present

## 2018-12-22 NOTE — Progress Notes (Signed)
I connected with Sabrina [name] on 12/22/18 at  1:30 PM EDT by telephone and verified that I am speaking with the correct person using two identifiers.   I discussed the limitations, risks, security and privacy concerns of performing an evaluation and management service by telephone and the availability of in person appointments. I also discussed with the patient that there may be a patient responsible charge related to this service. The patient expressed understanding and agreed to proceed.   Progress Note   Patient ID: Sabrina [name], female   DOB: 12/18/1945, 73 y.o.   MRN: [id number]   Chief complaint follow-up regarding multiple injuries and diagnoses noted last visit with today's chief complaint on difficulty with her balance  Last visit we gave her the following plan  PLAN: (Rx., injectx, surgery, frx, mri/ct) Patient will be seen by [name] and ankle specialist for the [name]   Start physical therapy for the contusion of the left knee   Follow-up in 6 weeks Encounter Diagnoses Name Primary? . Left [name] pain Yes . Contusion of left knee, initial encounter   . Posterior tibial tendon dysfunction (PTTD) of left lower extremity   . Callus of [name]  From the last visit the medical decision making was: [place] [facility]   My independent reading of xrays:  X-rays of the knee negative   She had x-rays of the [name] there is some osteopenia no fracture is seen   Last visit this was the history:  73 year-old female fell injured her left knee with a direct fall onto the knee complains of pain in the front of the knee and decreased flexion and swelling.  Date of injury was unclear fell sometime in January.  Also complains of pain on the bottom of her [name] which started after she injured the knee and had to walk on her tiptoes  She also has inferior medial [name] and ankle pain which radiates up the left  leg      Review of Systems  Constitutional: Negative for chills and fever.     Allergies  Allergen Reactions  . Codeine Nausea And Vomiting    Stomach pain also     There were no vitals taken for this visit.  Physical Exam    Encounter Diagnoses  Name Primary?  . Left [name] pain Yes  . Contusion of left knee, subsequent encounter   . Posterior tibial tendon dysfunction (PTTD) of left lower extremity   . Callus of [name]     PLAN:   Currently she is having trouble with her balance.  She was placed on azithromycin for presumed infection and improved in terms of the pain she was having in her [name].  The therapy helped her knee.  She is currently having to use a cane because she is not able to balance well      Arther Abbott, MD 12/22/2018 1:36 PM  Follow Up Instructions:   When therapy opens back up she will go back for balance training  I will put her on the list of patients to call after COVID-19 restrictions are lifted    I discussed the assessment and treatment plan with the patient. The patient was provided an opportunity to ask questions and all were answered. The patient agreed with the plan and demonstrated an understanding of the instructions.   The patient was advised to call back or seek an in-person evaluation if the symptoms worsen or  if the condition fails to improve as anticipated.  I provided 5 minutes of non-face-to-face time during this encounter.

## 2018-12-22 NOTE — Telephone Encounter (Signed)
TODAY 130 VIRTUAL

## 2018-12-27 ENCOUNTER — Other Ambulatory Visit: Payer: Self-pay | Admitting: Nurse Practitioner

## 2018-12-28 ENCOUNTER — Ambulatory Visit: Payer: Medicare HMO | Admitting: Physical Therapy

## 2018-12-30 ENCOUNTER — Encounter: Payer: Medicare HMO | Admitting: Physical Therapy

## 2019-01-11 ENCOUNTER — Ambulatory Visit: Payer: Medicare HMO | Attending: Orthopedic Surgery | Admitting: Physical Therapy

## 2019-01-11 ENCOUNTER — Other Ambulatory Visit: Payer: Self-pay

## 2019-01-11 ENCOUNTER — Encounter: Payer: Self-pay | Admitting: Physical Therapy

## 2019-01-11 DIAGNOSIS — M25562 Pain in left knee: Secondary | ICD-10-CM

## 2019-01-11 DIAGNOSIS — M25662 Stiffness of left knee, not elsewhere classified: Secondary | ICD-10-CM | POA: Diagnosis not present

## 2019-01-11 NOTE — Therapy (Signed)
[facility] [place], [place], [address] Phone: [phone]   Fax:  [phone]  Physical Therapy Treatment  Patient Details  Name: Sabrina [name] MRN: [id number] Date of Birth: Aug 30, 1946 Referring Provider (PT): Arther Abbott MD   Encounter Date: 01/11/2019  PT End of Session - 01/11/19 1418    Visit Number  6    Number of Visits  12    Date for PT Re-Evaluation  02/14/19    Authorization Type  FOTO AT LEAST EVERY 5TH VISIT.  KX MODIFIER AFTER 15 VISITS.  PROGRESS NOTE AT 10TH VISIT.    PT Start Time  1408    PT Stop Time  1506    PT Time Calculation (min)  58 min    Activity Tolerance  Patient tolerated treatment well    Behavior During Therapy  [facility] for tasks assessed/performed       Past Medical History:  Diagnosis Date  . Anemia   . Ankle fracture, right    past hx. -"no surgery"  . Anxiety   . Asthma   . CHF (congestive heart failure) ([place]) 2009  . Chronic lower back pain   . Collagen vascular disease ([place])   . COPD (chronic obstructive pulmonary disease) ([place])   . Depression   . Fibromyalgia   . GERD (gastroesophageal reflux disease)   . Hyperlipidemia   . Hypertension   . Immature cataract of both eyes   . Myocardial infarction ([place])    "I've had a light one; don't know when it happened" (08/27/2017)  . Osteoarthritis   . Peripheral neuropathy    legs and feet  . Persistent atrial fibrillation   . Tubular adenoma of colon     Past Surgical History:  Procedure Laterality Date  . APPENDECTOMY    . AV NODE ABLATION N/A 08/27/2017   Procedure: AV NODE ABLATION;  Surgeon: [facility] Lance, MD;  Location: [facility];  Service: Cardiovascular;  Laterality: N/A;  . CHOLECYSTECTOMY OPEN  1978  . DILATION AND CURETTAGE OF UTERUS    . FEMUR FRACTURE SURGERY Left 2013   "put 7" rod in it"  . FRACTURE SURGERY    . LAPAROSCOPY  08/22/2016   Procedure: LAPAROSCOPY DIAGNOSTIC;  Surgeon: [name], MD;  [facility];  Service: General;;  . MEDIAL PARTIAL KNEE REPLACEMENT Right 2005   "@ [facility]"  . PACEMAKER IMPLANT N/A 08/27/2017   Procedure: PACEMAKER IMPLANT;  Surgeon: [facility] Lance, MD;  Location: [facility];  Service: Cardiovascular;  Laterality: N/A;  . ROUX-EN-Y GASTRIC BYPASS  2002   [place]  . SPLENECTOMY  2002  . TONSILLECTOMY  1944  . TUBAL LIGATION    . VAGINAL HYSTERECTOMY      There were no vitals filed for this visit.  Subjective Assessment - 01/11/19 1417    Subjective  COVID-19 screening performed prior to patient entering the building. Patient reports pain is about 5/10 and states she does her HEP "most of the time."     Pertinent History  Pacemaker.  Left femoral ORIF, MI, COPD, Fibromyalgia.    How long can you walk comfortably?  Short community distances.    Patient Stated Goals  Get out of pain.    Currently in Pain?  Yes    Pain Score  5     Pain Location  Knee    Pain Orientation  Left    Pain Descriptors / Indicators  Dull  Pain Onset  More than a month ago    Pain Frequency  Constant         [facility] PT Assessment - 01/11/19 0001      Assessment   Medical Diagnosis  Contusion of left knee.    Referring Provider (PT)  Arther Abbott MD      Precautions   Precautions  Fall    Precaution Comments  PACEMAKER                   [facility] Adult PT Treatment/Exercise - 01/11/19 0001      Exercises   Exercises  Knee/Hip      Knee/Hip Exercises: Aerobic   Nustep  Level 4 x 15 minutes.      Knee/Hip Exercises: Standing   Heel Raises  Both;2 sets;10 reps    Hip Flexion  AROM;Both;2 sets;20 reps;Knee bent    Hip Abduction  AROM;Both;2 sets;10 reps;Knee straight      Knee/Hip Exercises: Supine   Hip Adduction Isometric  AROM;Both;2 sets;10 reps    Straight Leg Raises  Strengthening;Left;2 sets;10 reps      Modalities   Modalities  Vasopneumatic      Vasopneumatic   Number Minutes  Vasopneumatic   15 minutes    Vasopnuematic Location   Knee    Vasopneumatic Pressure  Low               PT Short Term Goals - 11/16/18 1455      PT SHORT TERM GOAL #1   Title  STG's=LTG's.        PT Long Term Goals - 01/11/19 1440      PT LONG TERM GOAL #1   Title  Independent with a HEP.    Time  6    Period  Weeks    Status  On-going      PT LONG TERM GOAL #2   Title  Active left knee flexion to 120 degrees+ so the patient can perform functional tasks and do so with pain not > 2-3/10.    Time  6    Period  Weeks    Status  On-going   110     PT LONG TERM GOAL #3   Title  Walk a community distance with left knee pain not > 2-3/10.    Time  6    Period  Weeks    Status  On-going   5.5/10 and reports of weakness           Plan - 01/11/19 1450    Clinical Impression Statement  Patient was able to tolerate treatment well but did note ongoing weakness in LEs with new TEs. Patient provided with an updated HEP consisting of standing and supine exercises. Patient instructed to perform all standing exercises at kitchen counter or sink for safety as well as to have someone home with her. Patient reported understanding. Patient inquired information regarding an electric bike and PT suggested against it due to balance deficits. Patient reported understanding. Normal response to modalities upon removal.     Stability/Clinical Decision Making  Evolving/Moderate complexity    Clinical Decision Making  Low    Rehab Potential  Excellent    PT Frequency  2x / week    PT Duration  6 weeks    PT Treatment/Interventions  ADLs/Self Care Home Management;Cryotherapy;Moist Heat;Therapeutic exercise;Therapeutic activities;Patient/family education;Manual techniques;Passive range of motion;Vasopneumatic Device    PT Next Visit Plan  cont [place] for gentle ther ex/ STW/M to  left affected knee knee region.  Vasopneumatic.    Consulted and Agree with Plan of Care  Patient        Patient will benefit from skilled therapeutic intervention in order to improve the following deficits and impairments:  Decreased activity tolerance, Pain, Decreased strength, Decreased range of motion  Visit Diagnosis: Acute pain of left knee  Stiffness of left knee, not elsewhere classified     Problem List Patient Active Problem List   Diagnosis Date Noted  . Near syncope   . Hyperlipidemia 06/23/2016  . Colovesical fistula 06/12/2016  . Atrial fibrillation with RVR ([place]) 06/12/2016  . GERD (gastroesophageal reflux disease) 06/12/2016  . Fibromyalgia 06/12/2016  . Peripheral neuropathy 06/12/2016  . Atrial fibrillation ([place]) 06/12/2016  . S/P gastric bypass 01/22/2016  . S/P splenectomy 01/22/2016  . S/P cholecystectomy 01/22/2016  . Hx of small bowel obstruction 01/22/2016  . Asthma 10/19/2015  . Generalized anxiety disorder 10/19/2015  . Depression 10/19/2015  . Insomnia 10/19/2015  . Iron deficiency anemia 10/19/2015  . Vitamin D deficiency 11/29/2014  . Eczema 11/27/2014  . Benign essential HTN 09/25/2014  . Obesity (BMI 30-39.9) 09/25/2014  . Chronic diastolic heart failure ([place]) 09/25/2014  . Allergic rhinitis 02/28/2013    Gabriela Eves, PT, DPT 01/11/2019, 3:16 PM  [facility] [address] [place], [place], [address] Phone: [phone]   Fax:  [phone]  Name: MAYAR WHITTIER MRN: [id number] Date of Birth: [birth year]

## 2019-01-13 ENCOUNTER — Encounter: Payer: Medicare HMO | Admitting: Physical Therapy

## 2019-01-18 ENCOUNTER — Encounter: Payer: Medicare HMO | Admitting: Physical Therapy

## 2019-01-25 ENCOUNTER — Telehealth (INDEPENDENT_AMBULATORY_CARE_PROVIDER_SITE_OTHER): Payer: Medicare HMO | Admitting: Neurology

## 2019-01-25 ENCOUNTER — Other Ambulatory Visit: Payer: Self-pay

## 2019-01-25 DIAGNOSIS — R55 Syncope and collapse: Secondary | ICD-10-CM

## 2019-01-25 DIAGNOSIS — G44309 Post-traumatic headache, unspecified, not intractable: Secondary | ICD-10-CM | POA: Diagnosis not present

## 2019-01-25 MED ORDER — AMITRIPTYLINE HCL 10 MG PO TABS: ORAL_TABLET | ORAL | 3 refills | Status: DC
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
## 2019-01-26 ENCOUNTER — Telehealth: Payer: Self-pay | Admitting: Cardiovascular Disease

## 2019-01-26 NOTE — Telephone Encounter (Signed)
Virtual Visit Pre-Appointment Phone Call  "(Name), I am calling you today to discuss your upcoming appointment. We are currently trying to limit exposure to the virus that causes COVID-19 by seeing patients at home rather than in the office."  1. "What is the BEST phone number to call the day of the visit?" -  [phone]  2. Do you have or have access to (through a family member/friend) a smartphone with video capability that we can use for your visit?" a. If yes - list this number in appt notes as cell (if different from BEST phone #) and list the appointment type as a VIDEO visit in appointment notes b. If no - list the appointment type as a PHONE visit in appointment notes  3. Confirm consent - "In the setting of the current Covid19 crisis, you are scheduled for a (phone or video) visit with your provider on (date) at (time).  Just as we do with many in-office visits, in order for you to participate in this visit, we must obtain consent.  If you'd like, I can send this to your mychart (if signed up) or email for you to review.  Otherwise, I can obtain your verbal consent now.  All virtual visits are billed to your insurance company just like a normal visit would be.  By agreeing to a virtual visit, we'd like you to understand that the technology does not allow for your provider to perform an examination, and thus may limit your provider's ability to fully assess your condition. If your provider identifies any concerns that need to be evaluated in person, we will make arrangements to do so.  Finally, though the technology is pretty good, we cannot assure that it will always work on either your or our end, and in the setting of a video visit, we may have to convert it to a phone-only visit.  In either situation, we cannot ensure that we have a secure connection.  Are you willing to proceed?" STAFF: Did the patient verbally acknowledge consent to telehealth visit? Document YES/NO here: yes    Advise patient to be prepared - "Two hours prior to your appointment, go ahead and check your blood pressure, pulse, oxygen saturation, and your weight (if you have the equipment to check those) and write them all down. When your visit starts, your provider will ask you for this information. If you have an Apple Watch or Kardia device, please plan to have heart rate information ready on the day of your appointment. Please have a pen and paper handy nearby the day of the visit as well."  4. Give patient instructions for MyChart download to smartphone OR Doximity/Doxy.me as below if video visit (depending on what platform provider is using)  5. Inform patient they will receive a phone call 15 minutes prior to their appointment time (may be from unknown caller ID) so they should be prepared to answer    TELEPHONE CALL NOTE  TAYLI BUCH has been deemed a candidate for a follow-up tele-health visit to limit community exposure during the Covid-19 pandemic. I spoke with the patient via phone to ensure availability of phone/video source, confirm preferred email & phone number, and discuss instructions and expectations.  I reminded Sabrina [name] to be prepared with any vital sign and/or heart rhythm information that could potentially be obtained via home monitoring, at the time of her visit. I reminded Sabrina [name] to expect a phone call prior to her visit.  Lynnda Child Slaughter 01/26/2019 2:40 PM   INSTRUCTIONS FOR DOWNLOADING THE MYCHART APP TO SMARTPHONE  - The patient must first make sure to have activated MyChart and know their login information - If Apple, go to [employer] and type in MyChart in the search bar and download the app. If Android, ask patient to go to [employer] and type in [place] in the search bar and download the app. The app is free but as with any other app downloads, their phone may require them to verify saved payment information or Apple/Android password.   - The patient will need to then log into the app with their MyChart username and password, and select [facility] as their healthcare provider to link the account. When it is time for your visit, go to the MyChart app, find appointments, and click Begin Video Visit. Be sure to Select Allow for your device to access the Microphone and Camera for your visit. You will then be connected, and your provider will be with you shortly.  **If they have any issues connecting, or need assistance please contact MyChart service desk [phone])**  **If using a computer, in order to ensure the best quality for their visit they will need to use either of the following Internet Browsers: [employer], or Google Chrome**  IF USING DOXIMITY or DOXY.ME - The patient will receive a link just prior to their visit by text.     FULL LENGTH CONSENT FOR TELE-HEALTH VISIT   I hereby voluntarily request, consent and authorize [facility] and its employed or contracted physicians, physician assistants, nurse practitioners or other licensed health care professionals (the Practitioner), to provide me with telemedicine health care services (the Services") as deemed necessary by the treating Practitioner. I acknowledge and consent to receive the Services by the Practitioner via telemedicine. I understand that the telemedicine visit will involve communicating with the Practitioner through live audiovisual communication technology and the disclosure of certain medical information by electronic transmission. I acknowledge that I have been given the opportunity to request an in-person assessment or other available alternative prior to the telemedicine visit and am voluntarily participating in the telemedicine visit.  I understand that I have the right to withhold or withdraw my consent to the use of telemedicine in the course of my care at any time, without affecting my right to future care or treatment, and that  the Practitioner or I may terminate the telemedicine visit at any time. I understand that I have the right to inspect all information obtained and/or recorded in the course of the telemedicine visit and may receive copies of available information for a reasonable fee.  I understand that some of the potential risks of receiving the Services via telemedicine include:   Delay or interruption in medical evaluation due to technological equipment failure or disruption;  Information transmitted may not be sufficient (e.g. poor resolution of images) to allow for appropriate medical decision making by the Practitioner; and/or   In rare instances, security protocols could fail, causing a breach of personal health information.  Furthermore, I acknowledge that it is my responsibility to provide information about my medical history, conditions and care that is complete and accurate to the best of my ability. I acknowledge that Practitioner's advice, recommendations, and/or decision may be based on factors not within their control, such as incomplete or inaccurate data provided by me or distortions of diagnostic images or specimens that may result from electronic transmissions. I understand that the  practice of medicine is not an Chief Strategy Officer and that Practitioner makes no warranties or guarantees regarding treatment outcomes. I acknowledge that I will receive a copy of this consent concurrently upon execution via email to the email address I last provided but may also request a printed copy by calling the office of [facility].    I understand that my insurance will be billed for this visit.   I have read or had this consent read to me.  I understand the contents of this consent, which adequately explains the benefits and risks of the Services being provided via telemedicine.   I have been provided ample opportunity to ask questions regarding this consent and the Services and have had my questions answered to  my satisfaction.  I give my informed consent for the services to be provided through the use of telemedicine in my medical care  By participating in this telemedicine visit I agree to the above.

## 2019-01-27 ENCOUNTER — Other Ambulatory Visit: Payer: Self-pay

## 2019-01-27 ENCOUNTER — Ambulatory Visit: Payer: Medicare HMO | Admitting: *Deleted

## 2019-01-28 ENCOUNTER — Telehealth: Payer: Medicare HMO | Admitting: Cardiovascular Disease

## 2019-01-31 NOTE — Progress Notes (Signed)
Error

## 2019-02-01 ENCOUNTER — Encounter: Payer: Self-pay | Admitting: Cardiology

## 2019-02-01 ENCOUNTER — Telehealth (INDEPENDENT_AMBULATORY_CARE_PROVIDER_SITE_OTHER): Payer: Medicare HMO | Admitting: Cardiology

## 2019-02-01 NOTE — Patient Instructions (Signed)
Medication Instructions:    Labwork:   Testing/Procedures:   Follow-Up: Your physician recommends that you schedule a follow-up appointment in:   Any Other Special Instructions Will Be Listed Below (If Applicable).  If you need a refill on your cardiac medications before your next appointment, please call your pharmacy. 

## 2019-02-02 ENCOUNTER — Ambulatory Visit: Payer: Medicare HMO | Admitting: Neurology

## 2019-02-09 ENCOUNTER — Ambulatory Visit (INDEPENDENT_AMBULATORY_CARE_PROVIDER_SITE_OTHER): Payer: Medicare HMO | Admitting: *Deleted

## 2019-02-09 ENCOUNTER — Other Ambulatory Visit: Payer: Self-pay

## 2019-02-09 DIAGNOSIS — Z0000 Encounter for general adult medical examination without abnormal findings: Secondary | ICD-10-CM | POA: Diagnosis not present

## 2019-02-09 NOTE — Progress Notes (Signed)
MEDICARE ANNUAL WELLNESS VISIT  02/09/2019  Telephone Visit Disclaimer This Medicare AWV was conducted by telephone due to national recommendations for restrictions regarding the COVID-19 Pandemic (e.g. social distancing).  I verified, using two identifiers, that I am speaking with Jenell Milliner or their authorized healthcare agent. I discussed the limitations, risks, security, and privacy concerns of performing an evaluation and management service by telephone and the potential availability of an in-person appointment in the future. The patient expressed understanding and agreed to proceed.   Subjective:  MIXTLI RENO is a 73 y.o. female patient of Hawks, Theador Hawthorne, FNP who had a Medicare Annual Wellness Visit today via telephone. Zenith is Retired and lives with their grandson. she has 3 children. she reports that she is socially active and does interact with friends/family regularly. she is minimally physically active and enjoys gardening.  Patient Care Team: Sharion Balloon, FNP as PCP - General (Family Medicine) Minus Breeding, MD as PCP - Cardiology (Cardiology) Cameron Sprang, MD as Consulting Physician (Neurology)  Advanced Directives 02/09/2019 01/24/2019 08/27/2017 08/22/2017 08/18/2016 06/12/2016 06/12/2016  Does Patient Have a Medical Advance Directive? No No Yes Yes Yes No No  Type of Advance Directive - - Healthcare Power of [place] - -  Does patient want to make changes to medical advance directive? - - No - Patient declined No - Patient declined No - Patient declined - -  Copy of [place] in Chart? - - No - copy requested No - copy requested No - copy requested - -  Would patient like information on creating a medical advance directive? No - Patient declined - - - - No - patient declined information -    Hospital Utilization Over the Past 12 Months: # of hospitalizations or ER visits:  0 # of surgeries: 0  Review of Systems    Patient reports that her overall health is unchanged compared to last year.  Patient Reported Readings (BP, Pulse, CBG, Weight, etc) none  Review of Systems: Musculoskeletal ROS: positive for - pain in knee - left  All other systems negative.  Pain Assessment Pain : 0-10 Pain Score: 4  Pain Type: Chronic pain Pain Location: Knee Pain Orientation: Left Pain Descriptors / Indicators: Constant Pain Onset: More than a month ago Pain Frequency: Constant Pain Relieving Factors: gabapentin helps Effect of Pain on Daily Activities: pt states she is able to still do activities but they take longer and she takes breaks in between  Pain Relieving Factors: gabapentin helps  Current Medications & Allergies (verified) Allergies as of 02/09/2019      Reactions   Codeine Nausea And Vomiting   Stomach pain also      Medication List       Accurate as of Feb 09, 2019  9:14 AM. If you have any questions, ask your nurse or doctor.        STOP taking these medications   diclofenac sodium 1 % Gel Commonly known as:  Voltaren     TAKE these medications   amitriptyline 10 MG tablet Commonly known as:  ELAVIL Take 2 tablets every night   atorvastatin 20 MG tablet Commonly known as:  LIPITOR Take 1 tablet (20 mg total) by mouth daily.   calcium carbonate 1500 (600 Ca) MG Tabs tablet Commonly known as:  OSCAL Take by mouth daily with breakfast.   DULoxetine 60 MG capsule Commonly known as:  CYMBALTA TAKE 1 CAPSULE BY MOUTH EVERY DAY   Eliquis 5 MG Tabs tablet Generic drug:  apixaban TAKE 1 TABLET BY MOUTH TWICE A DAY   FISH OIL PO Take 1 capsule by mouth daily.   fluticasone 50 MCG/ACT nasal spray Commonly known as:  FLONASE SPRAY 2 SPRAYS INTO EACH NOSTRIL EVERY DAY   furosemide 40 MG tablet Commonly known as:  LASIX Take 1 tablet (40 mg total) by mouth daily.   gabapentin 100 MG capsule Commonly known as:  NEURONTIN TAKE  1 CAPSULE BY MOUTH TWICE A DAY   mupirocin ointment 2 % Commonly known as:  BACTROBAN APPLY TO AFFECTED AREA TWICE A DAY   ONE-A-DAY 50 PLUS PO Take 1 tablet by mouth 2 (two) times daily.   potassium chloride SA 20 MEQ tablet Commonly known as:  K-DUR Take 1 tablet (20 mEq total) by mouth daily.   PRESCRIPTION MEDICATION Apply 1 application topically daily as needed (excema). HAL Ointment   triamcinolone ointment 0.5 % Commonly known as:  KENALOG Apply 1 application topically 2 (two) times daily.   Ventolin HFA 108 (90 Base) MCG/ACT inhaler Generic drug:  albuterol TAKE 2 PUFFS BY MOUTH EVERY 6 HOURS AS NEEDED FOR WHEEZE OR SHORTNESS OF BREATH   Vitamin D (Ergocalciferol) 1.25 MG (50000 UT) Caps capsule Commonly known as:  DRISDOL TAKE 1 CAPSULE (50,000 UNITS TOTAL) BY MOUTH EVERY 7 (SEVEN) DAYS.       History (reviewed): Past Medical History:  Diagnosis Date  . Anemia   . Ankle fracture, right    past hx. -"no surgery"  . Anxiety   . Asthma   . CHF (congestive heart failure) ([place]) 2009  . Chronic lower back pain   . Collagen vascular disease ([place])   . COPD (chronic obstructive pulmonary disease) ([place])   . Depression   . Fibromyalgia   . GERD (gastroesophageal reflux disease)   . Hyperlipidemia   . Hypertension   . Immature cataract of both eyes   . Myocardial infarction ([place])    "I've had a light one; don't know when it happened" (08/27/2017)  . Osteoarthritis   . Peripheral neuropathy    legs and feet  . Persistent atrial fibrillation   . Tubular adenoma of colon    Past Surgical History:  Procedure Laterality Date  . APPENDECTOMY    . AV NODE ABLATION N/A 08/27/2017   Procedure: AV NODE ABLATION;  Surgeon: Evans Lance, MD;  Location: [facility];  Service: Cardiovascular;  Laterality: N/A;  . CHOLECYSTECTOMY OPEN  1978  . DILATION AND CURETTAGE OF UTERUS    . FEMUR FRACTURE SURGERY Left 2013   "put 7" rod in it"  . FRACTURE SURGERY    .  LAPAROSCOPY  08/22/2016   Procedure: LAPAROSCOPY DIAGNOSTIC;  Surgeon: [name], MD;  Location: [facility];  Service: General;;  . MEDIAL PARTIAL KNEE REPLACEMENT Right 2005   "@ [facility]"  . PACEMAKER IMPLANT N/A 08/27/2017   Procedure: PACEMAKER IMPLANT;  Surgeon: Evans Lance, MD;  Location: [facility];  Service: Cardiovascular;  Laterality: N/A;  . ROUX-EN-Y GASTRIC BYPASS  2002   [place]  . SPLENECTOMY  2002  . TONSILLECTOMY  1944  . TUBAL LIGATION    . VAGINAL HYSTERECTOMY     Family History  Problem Relation Age of Onset  . Cancer Mother   . Alzheimer's disease Mother   . Heart disease Mother   . Arthritis Father   .  Colon cancer Paternal Uncle 67  . Asthma Daughter   . Arthritis Daughter   . Obesity Daughter   . Arthritis Son   . Hyperlipidemia Son   . Obesity Son   . Arthritis Son   . Obesity Son    Social History   Socioeconomic History  . Marital status: Married    Spouse name: Not on file  . Number of children: 3  . Years of education: 2 years of college  . Highest education level: Some college, no degree  Occupational History  . Occupation: Retired  Scientific laboratory technician  . Financial resource strain: Not very hard  . Food insecurity:    Worry: Never true    Inability: Never true  . Transportation needs:    Medical: No    Non-medical: No  Tobacco Use  . Smoking status: Former Smoker    Packs/day: 0.50    Years: 25.00    Pack years: 12.50    Types: Cigarettes    Last attempt to quit: 02/26/1993    Years since quitting: 25.9  . Smokeless tobacco: Never Used  Substance and Sexual Activity  . Alcohol use: Not Currently    Comment: 08/27/2017 "nothing since early 2000s"  . Drug use: Not Currently  . Sexual activity: Not Currently    Birth control/protection: Surgical  Lifestyle  . Physical activity:    Days per week: 2 days    Minutes per session: 20 min  . Stress: Only a little  Relationships  . Social connections:    Talks on  phone: More than three times a week    Gets together: Twice a week    Attends religious service: More than 4 times per year    Active member of club or organization: Yes    Attends meetings of clubs or organizations: More than 4 times per year    Relationship status: Married  Other Topics Concern  . Not on file  Social History Narrative   Pt is right handed   Lives in single story home with her grandson   Has 3 adult children   Associated degree    Retired Quarry manager    Activities of Daily Living In your present state of health, do you have any difficulty performing the following activities: 02/09/2019  Hearing? N  Vision? Y  Comment pt has cataracts which she is waiting for surgery   Difficulty concentrating or making decisions? N  Walking or climbing stairs? Y  Comment due to her knee pain  Dressing or bathing? N  Doing errands, shopping? N  Preparing Food and eating ? N  Using the Toilet? N  In the past six months, have you accidently leaked urine? Y  Comment daily if she can't get to a bathroom quickly  Do you have problems with loss of bowel control? N  Managing your Medications? N  Managing your Finances? N  Housekeeping or managing your Housekeeping? N  Some recent data might be hidden    Patient Literacy How often do you need to have someone help you when you read instructions, pamphlets, or other written materials from your doctor or pharmacy?: 1 - Never What is the last grade level you completed in school?: 2 years of college  Exercise Current Exercise Habits: Home exercise routine, Type of exercise: walking, Time (Minutes): 20, Frequency (Times/Week): 2, Weekly Exercise (Minutes/Week): 40, Intensity: Mild, Exercise limited by: orthopedic condition(s)  Diet Patient reports consuming 3 meals a day and 1 snack(s) a day Patient  reports that her primary diet is: Regular Patient reports that she does have regular access to food.   Depression Screen PHQ 2/9 Scores  02/09/2019 11/03/2018 09/27/2018 06/29/2018 06/10/2018 03/04/2018 09/03/2017  PHQ - 2 Score 0 0 4 3 4 4  0  PHQ- 9 Score - - 11 14 10 13  -     Fall Risk Fall Risk  02/09/2019 01/24/2019 11/03/2018 10/04/2018 09/27/2018  Falls in the past year? 1 1 1 1 1   Number falls in past yr: 1 1 0 1 0  Injury with Fall? 1 1 0 1 0  Risk for fall due to : Impaired balance/gait Impaired balance/gait - - -     Objective:  Jenell Milliner seemed alert and oriented and she participated appropriately during our telephone visit.  Blood Pressure Weight BMI  BP Readings from Last 3 Encounters:  02/01/19 130/82  01/24/19 (!) 130/94  11/08/18 103/72   Wt Readings from Last 3 Encounters:  02/01/19 190 lb (86.2 kg)  01/24/19 180 lb (81.6 kg)  11/15/18 190 lb (86.2 kg)   BMI Readings from Last 1 Encounters:  02/01/19 32.61 kg/m    *Unable to obtain current vital signs, weight, and BMI due to telephone visit type  Hearing/Vision  . Loralyn did not seem to have difficulty with hearing/understanding during the telephone conversation . Reports that she has had a formal eye exam by an eye care professional within the past year . Reports that she has not had a formal hearing evaluation within the past year *Unable to fully assess hearing and vision during telephone visit type  Cognitive Function: 6CIT Screen 02/09/2019  What Year? 0 points  What month? 0 points  What time? 0 points  Count back from 20 0 points  Months in reverse 0 points  Repeat phrase 0 points  Total Score 0    Normal Cognitive Function Screening: Yes (Normal:0-7, Significant for Dysfunction: >8)  Immunization & Health Maintenance Record Immunization History  Administered Date(s) Administered  . Influenza, High Dose Seasonal PF 07/14/2017, 06/29/2018  . Influenza,inj,Quad PF,6+ Mos 06/21/2014, 08/28/2015, 06/14/2016  . Pneumococcal Conjugate-13 10/19/2015  . Pneumococcal Polysaccharide-23 06/14/2016  . Tdap 05/19/2007, 06/29/2018  .  Zoster 05/19/2014    Health Maintenance  Topic Date Due  . COLON CANCER SCREENING ANNUAL FOBT  02/12/2017  . MAMMOGRAM  02/09/2020 (Originally 10/22/2017)  . INFLUENZA VACCINE  04/23/2019  . COLONOSCOPY  02/12/2021  . TETANUS/TDAP  06/29/2028  . DEXA SCAN  Completed  . Hepatitis C Screening  Completed  . PNA vac Low Risk Adult  Completed       Assessment  This is a routine wellness examination for Jenell Milliner.  Health Maintenance: Due or Overdue Health Maintenance Due  Topic Date Due  . COLON CANCER SCREENING ANNUAL FOBT  02/12/2017    Jenell Milliner does not need a referral for Community Assistance: Care Management:   no Social Work:    no Prescription Assistance:  no Nutrition/Diabetes Education:  no   Plan:  Personalized Goals Goals Addressed            This Visit's Progress   . DIET - INCREASE WATER INTAKE        Personalized Health Maintenance & Screening Recommendations  Colorectal cancer screening-annual FOBT, pt is due for Mammogram which she declines and says she is never doing.  Lung Cancer Screening Recommended: no (Low Dose CT Chest recommended if Age 70-80 years, 30 pack-year currently smoking OR have quit w/in  past 15 years) Hepatitis C Screening recommended: no HIV Screening recommended: no  Advanced Directives: Written information was not prepared per patient's request.  Referrals & Orders No orders of the defined types were placed in this encounter.   Follow-up Plan . Follow-up with Sharion Balloon, FNP as planned . Pt will do FOBT at next visit with PCP    I have personally reviewed and noted the following in the patient's chart:   . Medical and social history . Use of alcohol, tobacco or illicit drugs  . Current medications and supplements . Functional ability and status . Nutritional status . Physical activity . Advanced directives . List of other physicians . Hospitalizations, surgeries, and ER visits in previous  12 months . Vitals . Screenings to include cognitive, depression, and falls . Referrals and appointments  In addition, I have reviewed and discussed with Jenell Milliner certain preventive protocols, quality metrics, and best practice recommendations. A written personalized care plan for preventive services as well as general preventive health recommendations is available and can be mailed to the patient at her request.      signature  02/09/2019

## 2019-02-09 NOTE — Patient Instructions (Signed)
Preventive Care 42 Years and Older, Female Preventive care refers to lifestyle choices and visits with your health care provider that can promote health and wellness. What does preventive care include?  A yearly physical exam. This is also called an annual well check.  Dental exams once or twice a year.  Routine eye exams. Ask your health care provider how often you should have your eyes checked.  Personal lifestyle choices, including: ? Daily care of your teeth and gums. ? Regular physical activity. ? Eating a healthy diet. ? Avoiding tobacco and drug use. ? Limiting alcohol use. ? Practicing safe sex. ? Taking low-dose aspirin every day. ? Taking vitamin and mineral supplements as recommended by your health care provider. What happens during an annual well check? The services and screenings done by your health care provider during your annual well check will depend on your age, overall health, lifestyle risk factors, and family history of disease. Counseling Your health care provider may ask you questions about your:  Alcohol use.  Tobacco use.  Drug use.  Emotional well-being.  Home and relationship well-being.  Sexual activity.  Eating habits.  History of falls.  Memory and ability to understand (cognition).  Work and work Statistician.  Reproductive health.  Screening You may have the following tests or measurements:  Height, weight, and BMI.  Blood pressure.  Lipid and cholesterol levels. These may be checked every 5 years, or more frequently if you are over 30 years old.  Skin check.  Lung cancer screening. You may have this screening every year starting at age 27 if you have a 30-pack-year history of smoking and currently smoke or have quit within the past 15 years.  Colorectal cancer screening. All adults should have this screening starting at age 33 and continuing until age 46. You will have tests every 1-10 years, depending on your results and the  type of screening test. People at increased risk should start screening at an earlier age. Screening tests may include: ? Guaiac-based fecal occult blood testing. ? Fecal immunochemical test (FIT). ? Stool DNA test. ? Virtual colonoscopy. ? Sigmoidoscopy. During this test, a flexible tube with a tiny camera (sigmoidoscope) is used to examine your rectum and lower colon. The sigmoidoscope is inserted through your anus into your rectum and lower colon. ? Colonoscopy. During this test, a long, thin, flexible tube with a tiny camera (colonoscope) is used to examine your entire colon and rectum.  Hepatitis C blood test.  Hepatitis B blood test.  Sexually transmitted disease (STD) testing.  Diabetes screening. This is done by checking your blood sugar (glucose) after you have not eaten for a while (fasting). You may have this done every 1-3 years.  Bone density scan. This is done to screen for osteoporosis. You may have this done starting at age 37.  Mammogram. This may be done every 1-2 years. Talk to your health care provider about how often you should have regular mammograms. Talk with your health care provider about your test results, treatment options, and if necessary, the need for more tests. Vaccines Your health care provider may recommend certain vaccines, such as:  Influenza vaccine. This is recommended every year.  Tetanus, diphtheria, and acellular pertussis (Tdap, Td) vaccine. You may need a Td booster every 10 years.  Varicella vaccine. You may need this if you have not been vaccinated.  Zoster vaccine. You may need this after age 38.  Measles, mumps, and rubella (MMR) vaccine. You may need at least  one dose of MMR if you were born in 1957 or later. You may also need a second dose.  Pneumococcal 13-valent conjugate (PCV13) vaccine. One dose is recommended after age 24.  Pneumococcal polysaccharide (PPSV23) vaccine. One dose is recommended after age 24.  Meningococcal  vaccine. You may need this if you have certain conditions.  Hepatitis A vaccine. You may need this if you have certain conditions or if you travel or work in places where you may be exposed to hepatitis A.  Hepatitis B vaccine. You may need this if you have certain conditions or if you travel or work in places where you may be exposed to hepatitis B.  Haemophilus influenzae type b (Hib) vaccine. You may need this if you have certain conditions. Talk to your health care provider about which screenings and vaccines you need and how often you need them. This information is not intended to replace advice given to you by your health care provider. Make sure you discuss any questions you have with your health care provider. Document Released: 10/05/2015 Document Revised: 10/29/2017 Document Reviewed: 07/10/2015 Elsevier Interactive Patient Education  2019 [employer].

## 2019-02-10 ENCOUNTER — Other Ambulatory Visit: Payer: Self-pay | Admitting: Family

## 2019-02-16 ENCOUNTER — Other Ambulatory Visit: Payer: Self-pay | Admitting: Family

## 2019-02-16 DIAGNOSIS — M26609 Unspecified temporomandibular joint disorder, unspecified side: Secondary | ICD-10-CM

## 2019-02-24 ENCOUNTER — Encounter: Payer: Medicare HMO | Admitting: *Deleted

## 2019-02-25 ENCOUNTER — Telehealth: Payer: Self-pay

## 2019-02-25 NOTE — Telephone Encounter (Signed)
Left message for patient to remind of missed remote transmission.  

## 2019-03-09 ENCOUNTER — Ambulatory Visit (INDEPENDENT_AMBULATORY_CARE_PROVIDER_SITE_OTHER): Payer: Medicare HMO | Admitting: *Deleted

## 2019-03-09 DIAGNOSIS — I442 Atrioventricular block, complete: Secondary | ICD-10-CM

## 2019-03-10 LAB — CUP PACEART REMOTE DEVICE CHECK
Battery Remaining Longevity: 115 mo
Battery Voltage: 2.92 V
Brady Statistic AP VP Percent: 0.26 %
Brady Statistic AP VS Percent: 99.7 %
Brady Statistic AS VP Percent: 0 %
Brady Statistic AS VS Percent: 0.04 %
Brady Statistic RA Percent Paced: 100 %
Brady Statistic RV Percent Paced: 0.26 %
Date Time Interrogation Session: 20200617133304
Implantable Lead Implant Date: 20181206
Implantable Lead Implant Date: 20181206
Implantable Lead Location: 753860
Implantable Lead Location: 753860
Implantable Lead Model: 3830
Implantable Lead Model: 5076
Implantable Pulse Generator Implant Date: 20181206
Lead Channel Impedance Value: 304 Ohm
Lead Channel Impedance Value: 399 Ohm
Lead Channel Impedance Value: 418 Ohm
Lead Channel Impedance Value: 627 Ohm
Lead Channel Sensing Intrinsic Amplitude: 4.75 mV
Lead Channel Sensing Intrinsic Amplitude: 9.75 mV
Lead Channel Setting Pacing Amplitude: 2 V
Lead Channel Setting Pacing Amplitude: 2.5 V
Lead Channel Setting Pacing Pulse Width: 0.3 ms
Lead Channel Setting Sensing Sensitivity: 2 mV

## 2019-03-12 ENCOUNTER — Other Ambulatory Visit: Payer: Self-pay | Admitting: Family

## 2019-03-12 DIAGNOSIS — F411 Generalized anxiety disorder: Secondary | ICD-10-CM

## 2019-03-21 ENCOUNTER — Encounter: Payer: Self-pay | Admitting: Cardiology

## 2019-03-21 NOTE — Progress Notes (Signed)
Remote pacemaker transmission.   

## 2019-04-09 ENCOUNTER — Other Ambulatory Visit: Payer: Self-pay | Admitting: Family

## 2019-04-09 DIAGNOSIS — F411 Generalized anxiety disorder: Secondary | ICD-10-CM

## 2019-04-21 ENCOUNTER — Encounter: Payer: Self-pay | Admitting: Cardiovascular Disease

## 2019-04-21 ENCOUNTER — Telehealth (INDEPENDENT_AMBULATORY_CARE_PROVIDER_SITE_OTHER): Payer: Medicare HMO | Admitting: Cardiovascular Disease

## 2019-04-21 VITALS — BP 133/86 | HR 83 | Ht 63.0 in | Wt 180.0 lb

## 2019-04-21 DIAGNOSIS — Z950 Presence of cardiac pacemaker: Secondary | ICD-10-CM

## 2019-04-21 DIAGNOSIS — I4819 Other persistent atrial fibrillation: Secondary | ICD-10-CM | POA: Diagnosis not present

## 2019-04-21 DIAGNOSIS — I5032 Chronic diastolic (congestive) heart failure: Secondary | ICD-10-CM

## 2019-04-21 DIAGNOSIS — I10 Essential (primary) hypertension: Secondary | ICD-10-CM

## 2019-04-21 DIAGNOSIS — I442 Atrioventricular block, complete: Secondary | ICD-10-CM

## 2019-04-21 NOTE — Patient Instructions (Addendum)

## 2019-04-21 NOTE — Progress Notes (Signed)
Virtual Visit via Telephone Note   This visit type was conducted due to national recommendations for restrictions regarding the COVID-19 Pandemic (e.g. social distancing) in an effort to limit this patient's exposure and mitigate transmission in our community.  Due to her co-morbid illnesses, this patient is at least at moderate risk for complications without adequate follow up.  This format is felt to be most appropriate for this patient at this time.  The patient did not have access to video technology/had technical difficulties with video requiring transitioning to audio format only (telephone).  All issues noted in this document were discussed and addressed.  No physical exam could be performed with this format.  Please refer to the patient's chart for her  consent to telehealth for [facility].   Date:  04/21/2019   ID:  Sabrina, [name] 1946-05-15, MRN [id number]  Patient Location: Home Provider Location: Office  PCP:  Sharion Balloon, FNP  Cardiologist:  Kate Sable, MD  Electrophysiologist:  None   Evaluation Performed:  Follow-Up Visit  Chief Complaint: Persistent atrial fibrillation  History of Present Illness:    Sabrina [name] is a 73 y.o. female with a history of persistent atrial fibrillation and underwent AV node ablation and pacemaker implantation.  She is on Eliquis for anticoagulation.  She purchased an exercise bike after the pandemic started and exercises for 20 to 40 minutes every day.  She has gone down from 190 to 279 pounds on her scales.  She wants to go back to doing yoga in [place] and [place].  She has been having frequent falls and has been seeing an orthopedic doctor.  She has received physical therapy.  She was told to use a cane.  She has some chronic left knee swelling.  She has problems with balance.  She currently denies chest pain, shortness of breath, and palpitations.  She used to have palpitations in the past when she  got too anxious or was upset.  This has not occurred in some time.  She lives with her 72 year old grandson whom she adopted several years ago.  The patient does not have symptoms concerning for COVID-19 infection (fever, chills, cough, or new shortness of breath).    Past Medical History:  Diagnosis Date  . Anemia   . Ankle fracture, right    past hx. -"no surgery"  . Anxiety   . Asthma   . CHF (congestive heart failure) ([place]) 2009  . Chronic lower back pain   . Collagen vascular disease ([place])   . COPD (chronic obstructive pulmonary disease) ([place])   . Depression   . Fibromyalgia   . GERD (gastroesophageal reflux disease)   . Hyperlipidemia   . Hypertension   . Immature cataract of both eyes   . Myocardial infarction ([place])    "I've had a light one; don't know when it happened" (08/27/2017)  . Osteoarthritis   . Peripheral neuropathy    legs and feet  . Persistent atrial fibrillation   . Tubular adenoma of colon    Past Surgical History:  Procedure Laterality Date  . APPENDECTOMY    . AV NODE ABLATION N/A 08/27/2017   Procedure: AV NODE ABLATION;  Surgeon: Evans Lance, MD;  Location: [place] [facility];  Service: Cardiovascular;  Laterality: N/A;  . CHOLECYSTECTOMY OPEN  1978  . DILATION AND CURETTAGE OF UTERUS    . FEMUR FRACTURE SURGERY Left 2013   "put 7" rod in it"  . FRACTURE SURGERY    .  LAPAROSCOPY  08/22/2016   Procedure: LAPAROSCOPY DIAGNOSTIC;  Surgeon: [name], MD;  Location: [facility];  Service: General;;  . MEDIAL PARTIAL KNEE REPLACEMENT Right 2005   "@ [facility]"  . PACEMAKER IMPLANT N/A 08/27/2017   Procedure: PACEMAKER IMPLANT;  Surgeon: Evans Lance, MD;  Location: [place] [facility];  Service: Cardiovascular;  Laterality: N/A;  . ROUX-EN-Y GASTRIC BYPASS  2002   [place]  . SPLENECTOMY  2002  . TONSILLECTOMY  1944  . TUBAL LIGATION    . VAGINAL HYSTERECTOMY       Current Meds  Medication Sig  . amitriptyline (ELAVIL) 10  MG tablet Take 2 tablets every night  . atorvastatin (LIPITOR) 20 MG tablet Take 1 tablet (20 mg total) by mouth daily.  . calcium carbonate (OSCAL) 1500 (600 Ca) MG TABS tablet Take by mouth daily with breakfast.  . DULoxetine (CYMBALTA) 60 MG capsule TAKE 1 CAPSULE (60 MG TOTAL) BY MOUTH DAILY. (NEEDS TO BE SEEN BEFORE NEXT REFILL)  . ELIQUIS 5 MG TABS tablet TAKE 1 TABLET BY MOUTH TWICE A DAY  . fluticasone (FLONASE) 50 MCG/ACT nasal spray SPRAY 2 SPRAYS INTO EACH NOSTRIL EVERY DAY  . furosemide (LASIX) 40 MG tablet Take 1 tablet (40 mg total) by mouth daily.  Marland Kitchen gabapentin (NEURONTIN) 100 MG capsule Take 1 capsule (100 mg total) by mouth 2 (two) times daily. Needs to be seen  . Multiple Vitamins-Minerals (ONE-A-DAY 50 PLUS PO) Take 1 tablet by mouth 2 (two) times daily.  . mupirocin ointment (BACTROBAN) 2 % APPLY TO AFFECTED AREA TWICE A DAY  . Omega-3 Fatty Acids (FISH OIL PO) Take 1 capsule by mouth daily.  . potassium chloride SA (K-DUR,KLOR-CON) 20 MEQ tablet Take 1 tablet (20 mEq total) by mouth daily.  Marland Kitchen PRESCRIPTION MEDICATION Apply 1 application topically daily as needed (excema). HAL Ointment  . triamcinolone ointment (KENALOG) 0.5 % Apply 1 application topically 2 (two) times daily.  . VENTOLIN HFA 108 (90 Base) MCG/ACT inhaler TAKE 2 PUFFS BY MOUTH EVERY 6 HOURS AS NEEDED FOR WHEEZE OR SHORTNESS OF BREATH  . Vitamin D, Ergocalciferol, (DRISDOL) 1.25 MG (50000 UT) CAPS capsule TAKE 1 CAPSULE (50,000 UNITS TOTAL) BY MOUTH EVERY 7 (SEVEN) DAYS.     Allergies:   Codeine   Social History   Tobacco Use  . Smoking status: Former Smoker    Packs/day: 0.50    Years: 25.00    Pack years: 12.50    Types: Cigarettes    Quit date: 02/26/1993    Years since quitting: 26.1  . Smokeless tobacco: Never Used  Substance Use Topics  . Alcohol use: Not Currently    Comment: 08/27/2017 "nothing since early 2000s"  . Drug use: Not Currently     Family Hx: The patient's family history  includes Alzheimer's disease in her mother; Arthritis in her daughter, father, son, and son; Asthma in her daughter; Cancer in her mother; Colon cancer (age of onset: 55) in her paternal uncle; Heart disease in her mother; Hyperlipidemia in her son; Obesity in her daughter, son, and son.  ROS:   Please see the history of present illness.     All other systems reviewed and are negative.   Prior CV studies:   The following studies were reviewed today:  See above  Labs/Other Tests and Data Reviewed:    EKG:  No ECG reviewed.  Recent Labs: 06/29/2018: ALT 19; Hemoglobin 14.8; Platelets 376 09/27/2018: BUN 12; Creatinine, Ser 0.83; Potassium  4.4; Sodium 143   Recent Lipid Panel Lab Results  Component Value Date/Time   CHOL 216 (H) 03/04/2018 03:36 PM   TRIG 186 (H) 03/04/2018 03:36 PM   HDL 57 03/04/2018 03:36 PM   CHOLHDL 3.8 03/04/2018 03:36 PM   LDLCALC 122 (H) 03/04/2018 03:36 PM    Wt Readings from Last 3 Encounters:  04/21/19 180 lb (81.6 kg)  02/01/19 190 lb (86.2 kg)  01/24/19 180 lb (81.6 kg)     Objective:    Vital Signs:  BP 133/86   Pulse 83   Ht 5\' 3"  (1.6 m)   Wt 180 lb (81.6 kg)   BMI 31.89 kg/m    VITAL SIGNS:  reviewed  ASSESSMENT & PLAN:    1.  Persistent atrial fibrillation status post AV node ablation and pacemaker placement: Symptomatically stable.    Dr. Lovena Le stopped metoprolol in January 2020 due to complaints of dizziness.  Anticoagulated with Eliquis 5 mg twice daily.  Device function is normal. Had some nonsustained VT by interrogation in June 2020.  2.  Chronic diastolic heart failure: Symptomatically stable.  Continue Lasix 40 mg daily and supplemental potassium.  3.  Hypertension: Controlled on present therapy.  No changes.   COVID-19 Education: The signs and symptoms of COVID-19 were discussed with the patient and how to seek care for testing (follow up with PCP or arrange E-visit).  The importance of social distancing was discussed  today.  Time:   Today, I have spent 15 minutes with the patient with telehealth technology discussing the above problems.     Medication Adjustments/Labs and Tests Ordered: Current medicines are reviewed at length with the patient today.  Concerns regarding medicines are outlined above.   Tests Ordered: No orders of the defined types were placed in this encounter.   Medication Changes: No orders of the defined types were placed in this encounter.   Follow Up:  Virtual Visit or In Person in 6 month(s)  Signed, Kate Sable, MD  04/21/2019 11:54 AM    [place]

## 2019-04-29 ENCOUNTER — Other Ambulatory Visit: Payer: Self-pay | Admitting: Family

## 2019-05-18 ENCOUNTER — Other Ambulatory Visit: Payer: Self-pay | Admitting: Family

## 2019-05-18 DIAGNOSIS — M26609 Unspecified temporomandibular joint disorder, unspecified side: Secondary | ICD-10-CM

## 2019-05-18 NOTE — Telephone Encounter (Signed)
Attempted to contact patient - line busy 

## 2019-05-18 NOTE — Telephone Encounter (Signed)
[facility]. NTBS RF given 02/17/19. Last visit 11/03/18

## 2019-05-29 DIAGNOSIS — L02235 Carbuncle of perineum: Secondary | ICD-10-CM | POA: Diagnosis not present

## 2019-06-08 ENCOUNTER — Telehealth: Payer: Self-pay | Admitting: Emergency Medicine

## 2019-06-08 ENCOUNTER — Ambulatory Visit (INDEPENDENT_AMBULATORY_CARE_PROVIDER_SITE_OTHER): Payer: Medicare HMO | Admitting: *Deleted

## 2019-06-08 DIAGNOSIS — I442 Atrioventricular block, complete: Secondary | ICD-10-CM

## 2019-06-08 LAB — CUP PACEART REMOTE DEVICE CHECK
Battery Remaining Longevity: 112 mo
Battery Voltage: 2.92 V
Brady Statistic AP VP Percent: 0.16 %
Brady Statistic AP VS Percent: 99.83 %
Brady Statistic AS VP Percent: 0 %
Brady Statistic AS VS Percent: 0.01 %
Brady Statistic RA Percent Paced: 100 %
Brady Statistic RV Percent Paced: 0.16 %
Date Time Interrogation Session: 20200916060527
Implantable Lead Implant Date: 20181206
Implantable Lead Implant Date: 20181206
Implantable Lead Location: 753860
Implantable Lead Location: 753860
Implantable Lead Model: 3830
Implantable Lead Model: 5076
Implantable Pulse Generator Implant Date: 20181206
Lead Channel Impedance Value: 285 Ohm
Lead Channel Impedance Value: 361 Ohm
Lead Channel Impedance Value: 418 Ohm
Lead Channel Impedance Value: 513 Ohm
Lead Channel Sensing Intrinsic Amplitude: 4.75 mV
Lead Channel Sensing Intrinsic Amplitude: 4.75 mV
Lead Channel Sensing Intrinsic Amplitude: 9.75 mV
Lead Channel Sensing Intrinsic Amplitude: 9.75 mV
Lead Channel Setting Pacing Amplitude: 2 V
Lead Channel Setting Pacing Amplitude: 2.5 V
Lead Channel Setting Pacing Pulse Width: 0.3 ms
Lead Channel Setting Sensing Sensitivity: 2 mV

## 2019-06-08 NOTE — Telephone Encounter (Signed)
Patient asymptomatic with 19 cycle episode of NSVT that was recorded on 91 day transmission.

## 2019-06-13 ENCOUNTER — Other Ambulatory Visit: Payer: Self-pay | Admitting: Cardiovascular Disease

## 2019-06-14 ENCOUNTER — Encounter: Payer: Self-pay | Admitting: Cardiology

## 2019-06-14 NOTE — Progress Notes (Signed)
Remote pacemaker transmission.   

## 2019-07-14 ENCOUNTER — Other Ambulatory Visit: Payer: Self-pay | Admitting: Family

## 2019-07-14 DIAGNOSIS — F411 Generalized anxiety disorder: Secondary | ICD-10-CM

## 2019-07-18 ENCOUNTER — Other Ambulatory Visit: Payer: Self-pay | Admitting: Family

## 2019-07-25 ENCOUNTER — Other Ambulatory Visit: Payer: Self-pay | Admitting: Family

## 2019-07-25 DIAGNOSIS — M26609 Unspecified temporomandibular joint disorder, unspecified side: Secondary | ICD-10-CM

## 2019-07-25 NOTE — Telephone Encounter (Signed)
Left message needs to be seen.

## 2019-07-25 NOTE — Telephone Encounter (Signed)
[facility] NTBS 30 days given 02/17/19 Last OV 11/03/18

## 2019-07-28 ENCOUNTER — Other Ambulatory Visit: Payer: Self-pay | Admitting: Family

## 2019-07-28 DIAGNOSIS — F411 Generalized anxiety disorder: Secondary | ICD-10-CM

## 2019-08-26 ENCOUNTER — Other Ambulatory Visit: Payer: Self-pay | Admitting: Family

## 2019-08-26 DIAGNOSIS — F411 Generalized anxiety disorder: Secondary | ICD-10-CM

## 2019-09-06 ENCOUNTER — Encounter: Payer: Self-pay | Admitting: Neurology

## 2019-09-07 ENCOUNTER — Other Ambulatory Visit: Payer: Self-pay | Admitting: Family

## 2019-09-07 ENCOUNTER — Ambulatory Visit (INDEPENDENT_AMBULATORY_CARE_PROVIDER_SITE_OTHER): Payer: Medicare HMO | Admitting: *Deleted

## 2019-09-07 ENCOUNTER — Other Ambulatory Visit: Payer: Self-pay

## 2019-09-07 ENCOUNTER — Telehealth (INDEPENDENT_AMBULATORY_CARE_PROVIDER_SITE_OTHER): Payer: Medicare HMO | Admitting: Neurology

## 2019-09-07 VITALS — Ht 62.0 in | Wt 179.0 lb

## 2019-09-07 DIAGNOSIS — G44309 Post-traumatic headache, unspecified, not intractable: Secondary | ICD-10-CM

## 2019-09-07 DIAGNOSIS — F411 Generalized anxiety disorder: Secondary | ICD-10-CM

## 2019-09-07 DIAGNOSIS — Z950 Presence of cardiac pacemaker: Secondary | ICD-10-CM | POA: Diagnosis not present

## 2019-09-07 MED ORDER — AMITRIPTYLINE HCL 10 MG PO TABS: ORAL_TABLET | ORAL | 3 refills | Status: DC
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
## 2019-09-07 NOTE — Telephone Encounter (Signed)
[facility]. NTBS 30 days given 08/01/19

## 2019-09-08 LAB — CUP PACEART REMOTE DEVICE CHECK
Battery Remaining Longevity: 107 mo
Battery Voltage: 2.92 V
Brady Statistic AP VP Percent: 0.15 %
Brady Statistic AP VS Percent: 99.82 %
Brady Statistic AS VP Percent: 0 %
Brady Statistic AS VS Percent: 0.03 %
Brady Statistic RA Percent Paced: 100 %
Brady Statistic RV Percent Paced: 0.15 %
Date Time Interrogation Session: 20201216010732
Implantable Lead Implant Date: 20181206
Implantable Lead Implant Date: 20181206
Implantable Lead Location: 753860
Implantable Lead Location: 753860
Implantable Lead Model: 3830
Implantable Lead Model: 5076
Implantable Pulse Generator Implant Date: 20181206
Lead Channel Impedance Value: 304 Ohm
Lead Channel Impedance Value: 361 Ohm
Lead Channel Impedance Value: 418 Ohm
Lead Channel Impedance Value: 494 Ohm
Lead Channel Sensing Intrinsic Amplitude: 4.75 mV
Lead Channel Sensing Intrinsic Amplitude: 4.75 mV
Lead Channel Sensing Intrinsic Amplitude: 9.75 mV
Lead Channel Sensing Intrinsic Amplitude: 9.75 mV
Lead Channel Setting Pacing Amplitude: 2 V
Lead Channel Setting Pacing Amplitude: 2.5 V
Lead Channel Setting Pacing Pulse Width: 0.3 ms
Lead Channel Setting Sensing Sensitivity: 2 mV

## 2019-09-12 ENCOUNTER — Telehealth: Payer: Self-pay | Admitting: Family

## 2019-09-12 ENCOUNTER — Other Ambulatory Visit: Payer: Self-pay | Admitting: Family Medicine

## 2019-09-12 DIAGNOSIS — F411 Generalized anxiety disorder: Secondary | ICD-10-CM

## 2019-09-12 MED ORDER — DULOXETINE HCL 60 MG PO CPEP: 60.0000 mg | ORAL_CAPSULE | Freq: Every day | ORAL | 0 refills | Status: DC

## 2019-09-12 NOTE — Telephone Encounter (Signed)
1 mth sent in and appt made

## 2019-09-27 DIAGNOSIS — H2513 Age-related nuclear cataract, bilateral: Secondary | ICD-10-CM | POA: Diagnosis not present

## 2019-09-27 DIAGNOSIS — H43813 Vitreous degeneration, bilateral: Secondary | ICD-10-CM | POA: Diagnosis not present

## 2019-09-27 DIAGNOSIS — H52209 Unspecified astigmatism, unspecified eye: Secondary | ICD-10-CM | POA: Diagnosis not present

## 2019-09-27 DIAGNOSIS — Z0100 Encounter for examination of eyes and vision without abnormal findings: Secondary | ICD-10-CM | POA: Diagnosis not present

## 2019-09-29 ENCOUNTER — Other Ambulatory Visit: Payer: Self-pay

## 2019-09-30 ENCOUNTER — Ambulatory Visit (INDEPENDENT_AMBULATORY_CARE_PROVIDER_SITE_OTHER): Payer: Medicare HMO | Admitting: Family

## 2019-09-30 ENCOUNTER — Encounter: Payer: Self-pay | Admitting: Family

## 2019-09-30 VITALS — BP 128/89 | HR 75 | Temp 96.2°F | Ht 62.0 in | Wt 182.4 lb

## 2019-09-30 DIAGNOSIS — J4520 Mild intermittent asthma, uncomplicated: Secondary | ICD-10-CM

## 2019-09-30 DIAGNOSIS — E559 Vitamin D deficiency, unspecified: Secondary | ICD-10-CM

## 2019-09-30 DIAGNOSIS — G4700 Insomnia, unspecified: Secondary | ICD-10-CM

## 2019-09-30 DIAGNOSIS — E782 Mixed hyperlipidemia: Secondary | ICD-10-CM

## 2019-09-30 DIAGNOSIS — I4891 Unspecified atrial fibrillation: Secondary | ICD-10-CM | POA: Diagnosis not present

## 2019-09-30 DIAGNOSIS — F411 Generalized anxiety disorder: Secondary | ICD-10-CM

## 2019-09-30 DIAGNOSIS — I5032 Chronic diastolic (congestive) heart failure: Secondary | ICD-10-CM | POA: Diagnosis not present

## 2019-09-30 DIAGNOSIS — K219 Gastro-esophageal reflux disease without esophagitis: Secondary | ICD-10-CM | POA: Diagnosis not present

## 2019-09-30 DIAGNOSIS — F331 Major depressive disorder, recurrent, moderate: Secondary | ICD-10-CM

## 2019-09-30 DIAGNOSIS — E669 Obesity, unspecified: Secondary | ICD-10-CM | POA: Diagnosis not present

## 2019-09-30 DIAGNOSIS — G8929 Other chronic pain: Secondary | ICD-10-CM

## 2019-09-30 DIAGNOSIS — I10 Essential (primary) hypertension: Secondary | ICD-10-CM

## 2019-09-30 DIAGNOSIS — R531 Weakness: Secondary | ICD-10-CM

## 2019-09-30 DIAGNOSIS — G6289 Other specified polyneuropathies: Secondary | ICD-10-CM

## 2019-09-30 DIAGNOSIS — M25562 Pain in left knee: Secondary | ICD-10-CM

## 2019-09-30 DIAGNOSIS — D509 Iron deficiency anemia, unspecified: Secondary | ICD-10-CM

## 2019-09-30 NOTE — Progress Notes (Signed)
Subjective:    Patient ID: Sabrina [name], female    DOB: 19-Jul-1946, 74 y.o.   MRN: [id number]  Chief Complaint  Patient presents with  . Medical Management of Chronic Issues   Pt presents to the office today for chronic follow up. Pt is followed by Cardiologists every 4 months for A Fib and CHF. Has a pacemaker.   She is followed by Neurologists every 3 months for weakness and near syncope episodes.  She states she was working with PT before COVID that was helping with her leg weakness. She has intermittent aching pain of 5 out 10 in her left knee, ankle, and calf.  She reports she was doing well, but was stopped because of COVD. She feels like she would benefit from restarting.   Hypertension This is a chronic problem. The current episode started more than 1 year ago. The problem has been resolved since onset. The problem is controlled. Associated symptoms include anxiety, malaise/fatigue, peripheral edema ("by the end of the day") and shortness of breath ("at times"). Risk factors for coronary artery disease include dyslipidemia and sedentary lifestyle. The current treatment provides moderate improvement. Hypertensive end-organ damage includes CAD/MI. There is no history of kidney disease or heart failure.  Depression        This is a chronic problem.  The current episode started more than 1 year ago.   The onset quality is gradual.   The problem occurs intermittently.  Associated symptoms include decreased concentration, fatigue, insomnia, restlessness and sad.  Associated symptoms include no helplessness and no hopelessness.  Past medical history includes anxiety.   Anxiety Presents for follow-up visit. Symptoms include decreased concentration, excessive worry, insomnia, irritability, nervous/anxious behavior, panic, restlessness and shortness of breath ("at times"). The quality of sleep is good.   Her past medical history is significant for anemia and asthma.  Asthma She complains  of frequent throat clearing and shortness of breath ("at times"). There is no cough. This is a chronic problem. The current episode started more than 1 year ago. The problem occurs intermittently. The problem has been waxing and waning. Associated symptoms include malaise/fatigue. Pertinent negatives include no dyspnea on exertion or ear congestion. Her past medical history is significant for asthma.  Hyperlipidemia This is a chronic problem. The current episode started more than 1 year ago. Exacerbating diseases include obesity. Associated symptoms include shortness of breath ("at times"). Current antihyperlipidemic treatment includes statins. The current treatment provides moderate improvement of lipids. Risk factors for coronary artery disease include dyslipidemia, hypertension, a sedentary lifestyle and post-menopausal.  Insomnia Primary symptoms: difficulty falling asleep, frequent awakening, malaise/fatigue.  The current episode started more than one year. The onset quality is gradual. The problem occurs intermittently. PMH includes: depression.  Anemia Presents for follow-up visit. Symptoms include malaise/fatigue. There has been no bruising/bleeding easily, leg swelling or light-headedness. There is no history of heart failure.   Peripheral Neuropathy Pt taking amitriptyline 10 mg every night and and gabapentin 100 mg    Review of Systems  Constitutional: Positive for fatigue, irritability and malaise/fatigue.  Respiratory: Positive for shortness of breath ("at times"). Negative for cough.   Cardiovascular: Negative for dyspnea on exertion.  Neurological: Negative for light-headedness.  Hematological: Does not bruise/bleed easily.  Psychiatric/Behavioral: Positive for decreased concentration and depression. The patient is nervous/anxious and has insomnia.        Objective:   Physical Exam Vitals reviewed.  Constitutional:      General: She is not in acute  distress.    Appearance:  She is well-developed.  HENT:     Head: Normocephalic and atraumatic.     Right Ear: Tympanic membrane normal.     Left Ear: Tympanic membrane normal.  Eyes:     Pupils: Pupils are equal, round, and reactive to light.  Neck:     Thyroid: No thyromegaly.  Cardiovascular:     Rate and Rhythm: Normal rate and regular rhythm.     Heart sounds: Normal heart sounds. No murmur.  Pulmonary:     Effort: Pulmonary effort is normal. No respiratory distress.     Breath sounds: Normal breath sounds. No wheezing.  Abdominal:     General: Bowel sounds are normal. There is no distension.     Palpations: Abdomen is soft.     Tenderness: There is no abdominal tenderness.  Musculoskeletal:        General: No tenderness. Normal range of motion.     Cervical back: Normal range of motion and neck supple.     Left lower leg: Edema (trace) present.  Skin:    General: Skin is warm and dry.  Neurological:     Mental Status: She is alert and oriented to person, place, and time.     Cranial Nerves: No cranial nerve deficit.     Deep Tendon Reflexes: Reflexes are normal and symmetric.  Psychiatric:        Behavior: Behavior normal.        Thought Content: Thought content normal.        Judgment: Judgment normal.       BP 128/89   Pulse 75   Temp (!) 96.2 F (35.7 C) (Temporal)   Ht '5\' 2"'  (1.575 m)   Wt 182 lb 6.4 oz (82.7 kg)   SpO2 100%   BMI 33.36 kg/m      Assessment & Plan:  DALINDA HEIDT comes in today with chief complaint of Medical Management of Chronic Issues   Diagnosis and orders addressed:  1. Benign essential HTN - CMP14+EGFR - CBC with Differential/Platelet  2. Chronic diastolic heart failure (HCC) - CMP14+EGFR - CBC with Differential/Platelet  3. Mild intermittent asthma without complication - OBS96+GEZM - CBC with Differential/Platelet  4. Gastroesophageal reflux disease, unspecified whether esophagitis present - CMP14+EGFR - CBC with  Differential/Platelet  5. Generalized anxiety disorder - CMP14+EGFR - CBC with Differential/Platelet  6. Moderate episode of recurrent major depressive disorder (HCC) - CMP14+EGFR - CBC with Differential/Platelet  7. Vitamin D deficiency - CMP14+EGFR - CBC with Differential/Platelet - Vitamin D 25 hydroxy  8. Obesity (BMI 30-39.9) - CMP14+EGFR - CBC with Differential/Platelet  9. Insomnia, unspecified type - CMP14+EGFR - CBC with Differential/Platelet  10. Iron deficiency anemia, unspecified iron deficiency anemia type - CMP14+EGFR - CBC with Differential/Platelet  11. Mixed hyperlipidemia - CMP14+EGFR - CBC with Differential/Platelet - Lipid panel  12. Other polyneuropathy - CMP14+EGFR - CBC with Differential/Platelet  13. Atrial fibrillation, unspecified type (HCC)  - CMP14+EGFR - CBC with Differential/Platelet  14. Weakness - Ambulatory referral to Physical Therapy - CMP14+EGFR - CBC with Differential/Platelet  15. Chronic pain of left knee - Ambulatory referral to Physical Therapy - CMP14+EGFR - CBC with Differential/Platelet   Labs pending Health Maintenance reviewed Diet and exercise encouraged  Follow up plan: 3 months    Evelina Dun, FNP

## 2019-09-30 NOTE — Patient Instructions (Signed)
Health Maintenance After Age 73 After age 73, you are at a higher risk for certain long-term diseases and infections as well as injuries from falls. Falls are a major cause of broken bones and head injuries in people who are older than age 73. Getting regular preventive care can help to keep you healthy and well. Preventive care includes getting regular testing and making lifestyle changes as recommended by your health care provider. Talk with your health care provider about:  Which screenings and tests you should have. A screening is a test that checks for a disease when you have no symptoms.  A diet and exercise plan that is right for you. What should I know about screenings and tests to prevent falls? Screening and testing are the best ways to find a health problem early. Early diagnosis and treatment give you the best chance of managing medical conditions that are common after age 73. Certain conditions and lifestyle choices may make you more likely to have a fall. Your health care provider may recommend:  Regular vision checks. Poor vision and conditions such as cataracts can make you more likely to have a fall. If you wear glasses, make sure to get your prescription updated if your vision changes.  Medicine review. Work with your health care provider to regularly review all of the medicines you are taking, including over-the-counter medicines. Ask your health care provider about any side effects that may make you more likely to have a fall. Tell your health care provider if any medicines that you take make you feel dizzy or sleepy.  Osteoporosis screening. Osteoporosis is a condition that causes the bones to get weaker. This can make the bones weak and cause them to break more easily.  Blood pressure screening. Blood pressure changes and medicines to control blood pressure can make you feel dizzy.  Strength and balance checks. Your health care provider may recommend certain tests to check your  strength and balance while standing, walking, or changing positions.  Foot health exam. Foot pain and numbness, as well as not wearing proper footwear, can make you more likely to have a fall.  Depression screening. You may be more likely to have a fall if you have a fear of falling, feel emotionally low, or feel unable to do activities that you used to do.  Alcohol use screening. Using too much alcohol can affect your balance and may make you more likely to have a fall. What actions can I take to lower my risk of falls? General instructions  Talk with your health care provider about your risks for falling. Tell your health care provider if: ? You fall. Be sure to tell your health care provider about all falls, even ones that seem minor. ? You feel dizzy, sleepy, or off-balance.  Take over-the-counter and prescription medicines only as told by your health care provider. These include any supplements.  Eat a healthy diet and maintain a healthy weight. A healthy diet includes low-fat dairy products, low-fat (lean) meats, and fiber from whole grains, beans, and lots of fruits and vegetables. Home safety  Remove any tripping hazards, such as rugs, cords, and clutter.  Install safety equipment such as grab bars in bathrooms and safety rails on stairs.  Keep rooms and walkways well-lit. Activity   Follow a regular exercise program to stay fit. This will help you maintain your balance. Ask your health care provider what types of exercise are appropriate for you.  If you need a cane or   walker, use it as recommended by your health care provider.  Wear supportive shoes that have nonskid soles. Lifestyle  Do not drink alcohol if your health care provider tells you not to drink.  If you drink alcohol, limit how much you have: ? 0-1 drink a day for women. ? 0-2 drinks a day for men.  Be aware of how much alcohol is in your drink. In the [place] one drink equals one typical bottle of beer (12  oz), one-half glass of wine (5 oz), or one shot of hard liquor (1 oz).  Do not use any products that contain nicotine or tobacco, such as cigarettes and e-cigarettes. If you need help quitting, ask your health care provider. Summary  Having a healthy lifestyle and getting preventive care can help to protect your health and wellness after age 73.  Screening and testing are the best way to find a health problem early and help you avoid having a fall. Early diagnosis and treatment give you the best chance for managing medical conditions that are more common for people who are older than age 73.  Falls are a major cause of broken bones and head injuries in people who are older than age 73. Take precautions to prevent a fall at home.  Work with your health care provider to learn what changes you can make to improve your health and wellness and to prevent falls. This information is not intended to replace advice given to you by your health care provider. Make sure you discuss any questions you have with your health care provider. Document Revised: 12/30/2018 Document Reviewed: 07/22/2017 Elsevier Patient Education  2020 Elsevier Inc.  

## 2019-10-01 LAB — CMP14+EGFR
ALT: 8 IU/L (ref 0–32)
AST: 16 IU/L (ref 0–40)
Albumin/Globulin Ratio: 1.6 (ref 1.2–2.2)
Albumin: 3.6 g/dL — ABNORMAL LOW (ref 3.7–4.7)
Alkaline Phosphatase: 136 IU/L — ABNORMAL HIGH (ref 39–117)
BUN/Creatinine Ratio: 23 (ref 12–28)
BUN: 20 mg/dL (ref 8–27)
Bilirubin Total: 0.6 mg/dL (ref 0.0–1.2)
CO2: 24 mmol/L (ref 20–29)
Calcium: 9.3 mg/dL (ref 8.7–10.3)
Chloride: 103 mmol/L (ref 96–106)
Creatinine, Ser: 0.88 mg/dL (ref 0.57–1.00)
GFR calc Af Amer: 76 mL/min/{1.73_m2} (ref >59)
GFR calc non Af Amer: 66 mL/min/{1.73_m2} (ref >59)
Globulin, Total: 2.3 g/dL (ref 1.5–4.5)
Glucose: 96 mg/dL (ref 65–99)
Potassium: 3.7 mmol/L (ref 3.5–5.2)
Sodium: 141 mmol/L (ref 134–144)
Total Protein: 5.9 g/dL — ABNORMAL LOW (ref 6.0–8.5)

## 2019-10-01 LAB — CBC WITH DIFFERENTIAL/PLATELET
Basophils Absolute: 0.1 10*3/uL (ref 0.0–0.2)
Basos: 1 %
EOS (ABSOLUTE): 0.2 10*3/uL (ref 0.0–0.4)
Eos: 3 %
Hematocrit: 40.7 % (ref 34.0–46.6)
Hemoglobin: 13.7 g/dL (ref 11.1–15.9)
Immature Grans (Abs): 0 10*3/uL (ref 0.0–0.1)
Immature Granulocytes: 0 %
Lymphocytes Absolute: 1.8 10*3/uL (ref 0.7–3.1)
Lymphs: 26 %
MCH: 30.2 pg (ref 26.6–33.0)
MCHC: 33.7 g/dL (ref 31.5–35.7)
MCV: 90 fL (ref 79–97)
Monocytes Absolute: 0.5 10*3/uL (ref 0.1–0.9)
Monocytes: 7 %
Neutrophils Absolute: 4.4 10*3/uL (ref 1.4–7.0)
Neutrophils: 63 %
Platelets: 349 10*3/uL (ref 150–450)
RBC: 4.54 x10E6/uL (ref 3.77–5.28)
RDW: 12.1 % (ref 11.7–15.4)
WBC: 6.9 10*3/uL (ref 3.4–10.8)

## 2019-10-01 LAB — LIPID PANEL
Chol/HDL Ratio: 3.3 ratio (ref 0.0–4.4)
Cholesterol, Total: 199 mg/dL (ref 100–199)
HDL: 60 mg/dL (ref >39)
LDL Chol Calc (NIH): 115 mg/dL — ABNORMAL HIGH (ref 0–99)
Triglycerides: 137 mg/dL (ref 0–149)
VLDL Cholesterol Cal: 24 mg/dL (ref 5–40)

## 2019-10-01 LAB — VITAMIN D 25 HYDROXY (VIT D DEFICIENCY, FRACTURES): Vit D, 25-Hydroxy: 18.7 ng/mL — ABNORMAL LOW (ref 30.0–100.0)

## 2019-10-03 ENCOUNTER — Other Ambulatory Visit: Payer: Self-pay | Admitting: Family

## 2019-10-03 ENCOUNTER — Other Ambulatory Visit: Payer: Self-pay | Admitting: *Deleted

## 2019-10-03 DIAGNOSIS — Z780 Asymptomatic menopausal state: Secondary | ICD-10-CM

## 2019-10-03 DIAGNOSIS — R748 Abnormal levels of other serum enzymes: Secondary | ICD-10-CM

## 2019-10-03 MED ORDER — VITAMIN D (ERGOCALCIFEROL) 1.25 MG (50000 UNIT) PO CAPS: 50000.0000 [IU] | ORAL_CAPSULE | ORAL | 3 refills | Status: DC

## 2019-10-04 ENCOUNTER — Other Ambulatory Visit: Payer: Self-pay

## 2019-10-04 ENCOUNTER — Encounter: Payer: Self-pay | Admitting: Internal Medicine

## 2019-10-04 ENCOUNTER — Ambulatory Visit (INDEPENDENT_AMBULATORY_CARE_PROVIDER_SITE_OTHER): Payer: Medicare HMO | Admitting: Internal Medicine

## 2019-10-04 VITALS — BP 120/90 | HR 97 | Ht 64.0 in | Wt 184.0 lb

## 2019-10-04 DIAGNOSIS — I4821 Permanent atrial fibrillation: Secondary | ICD-10-CM | POA: Diagnosis not present

## 2019-10-04 DIAGNOSIS — I10 Essential (primary) hypertension: Secondary | ICD-10-CM

## 2019-10-04 DIAGNOSIS — Z950 Presence of cardiac pacemaker: Secondary | ICD-10-CM

## 2019-10-04 DIAGNOSIS — I5032 Chronic diastolic (congestive) heart failure: Secondary | ICD-10-CM | POA: Diagnosis not present

## 2019-10-04 LAB — GAMMA GT: GGT: 8 IU/L (ref 0–60)

## 2019-10-04 LAB — SPECIMEN STATUS REPORT

## 2019-10-04 NOTE — Progress Notes (Signed)
HPI [name] returns today for followup. She is a pleasant 74 yo woman with a h/o uncontrolled atrial fib, s/p PPM insertion and AV node ablation, diastolic heart failure, and HTN. She feels well in the short term. No chest pain or sob. No syncope.  Allergies  Allergen Reactions  . Codeine Nausea And Vomiting    Stomach pain also     Current Outpatient Medications  Medication Sig Dispense Refill  . amitriptyline (ELAVIL) 10 MG tablet Take 4 tablets every night 360 tablet 3  . atorvastatin (LIPITOR) 20 MG tablet Take 1 tablet (20 mg total) by mouth daily. 90 tablet 3  . calcium carbonate (OSCAL) 1500 (600 Ca) MG TABS tablet Take by mouth daily with breakfast.    . DULoxetine (CYMBALTA) 60 MG capsule Take 1 capsule (60 mg total) by mouth daily. (Needs to be seen before next refill) 30 capsule 0  . ELIQUIS 5 MG TABS tablet TAKE 1 TABLET BY MOUTH TWICE A DAY 60 tablet 11  . fluticasone (FLONASE) 50 MCG/ACT nasal spray SPRAY 2 SPRAYS INTO EACH NOSTRIL EVERY DAY 48 g 1  . furosemide (LASIX) 40 MG tablet TAKE 1 TABLET BY MOUTH EVERY DAY 90 tablet 2  . gabapentin (NEURONTIN) 100 MG capsule Take 1 capsule (100 mg total) by mouth 2 (two) times daily. Needs to be seen 180 capsule 0  . Multiple Vitamins-Minerals (ONE-A-DAY 50 PLUS PO) Take 1 tablet by mouth 2 (two) times daily.    . mupirocin ointment (BACTROBAN) 2 % APPLY TO AFFECTED AREA TWICE A DAY 22 g 2  . Omega-3 Fatty Acids (FISH OIL PO) Take 1 capsule by mouth daily.    . potassium chloride SA (K-DUR,KLOR-CON) 20 MEQ tablet Take 1 tablet (20 mEq total) by mouth daily. 90 tablet 2  . PRESCRIPTION MEDICATION Apply 1 application topically daily as needed (excema). HAL Ointment    . triamcinolone ointment (KENALOG) 0.5 % Apply 1 application topically 2 (two) times daily. 30 g 0  . VENTOLIN HFA 108 (90 Base) MCG/ACT inhaler TAKE 2 PUFFS BY MOUTH EVERY 6 HOURS AS NEEDED FOR WHEEZE OR SHORTNESS OF BREATH 18 Inhaler 2  . Vitamin D,  Ergocalciferol, (DRISDOL) 1.25 MG (50000 UNIT) CAPS capsule Take 1 capsule (50,000 Units total) by mouth every 7 (seven) days. 12 capsule 3   No current facility-administered medications for this visit.     Past Medical History:  Diagnosis Date  . Anemia   . Ankle fracture, right    past hx. -"no surgery"  . Anxiety   . Asthma   . CHF (congestive heart failure) ([place]) 2009  . Chronic lower back pain   . Collagen vascular disease ([place])   . COPD (chronic obstructive pulmonary disease) ([place])   . Depression   . Fibromyalgia   . GERD (gastroesophageal reflux disease)   . Hyperlipidemia   . Hypertension   . Immature cataract of both eyes   . Myocardial infarction ([place])    "I've had a light one; don't know when it happened" (08/27/2017)  . Osteoarthritis   . Peripheral neuropathy    legs and feet  . Persistent atrial fibrillation ([place])   . Tubular adenoma of colon     ROS:   All systems reviewed and negative except as noted in the HPI.   Past Surgical History:  Procedure Laterality Date  . APPENDECTOMY    . AV NODE ABLATION N/A 08/27/2017   Procedure: AV NODE ABLATION;  Surgeon: Lovena Le,  Champ Mungo, MD;  Location: [facility];  Service: Cardiovascular;  Laterality: N/A;  . CHOLECYSTECTOMY OPEN  1978  . DILATION AND CURETTAGE OF UTERUS    . FEMUR FRACTURE SURGERY Left 2013   "put 7" rod in it"  . FRACTURE SURGERY    . LAPAROSCOPY  08/22/2016   Procedure: LAPAROSCOPY DIAGNOSTIC;  Surgeon: [name], MD;  Location: [facility];  Service: General;;  . MEDIAL PARTIAL KNEE REPLACEMENT Right 2005   "@ [facility]"  . PACEMAKER IMPLANT N/A 08/27/2017   Procedure: PACEMAKER IMPLANT;  Surgeon: Evans Lance, MD;  Location: [facility];  Service: Cardiovascular;  Laterality: N/A;  . ROUX-EN-Y GASTRIC BYPASS  2002   [place]  . SPLENECTOMY  2002  . TONSILLECTOMY  1944  . TUBAL LIGATION    . VAGINAL HYSTERECTOMY       Family History  Problem Relation Age  of Onset  . Cancer Mother   . Alzheimer's disease Mother   . Heart disease Mother   . Arthritis Father   . Colon cancer Paternal Uncle 86  . Asthma Daughter   . Arthritis Daughter   . Obesity Daughter   . Arthritis Son   . Hyperlipidemia Son   . Obesity Son   . Arthritis Son   . Obesity Son      Social History   Socioeconomic History  . Marital status: Married    Spouse name: Not on file  . Number of children: 3  . Years of education: 2 years of college  . Highest education level: Some college, no degree  Occupational History  . Occupation: Retired  Tobacco Use  . Smoking status: Former Smoker    Packs/day: 0.50    Years: 25.00    Pack years: 12.50    Types: Cigarettes    Quit date: 02/26/1993    Years since quitting: 26.6  . Smokeless tobacco: Never Used  Substance and Sexual Activity  . Alcohol use: Not Currently    Comment: 08/27/2017 "nothing since early 2000s"  . Drug use: Not Currently  . Sexual activity: Not Currently    Birth control/protection: Surgical  Other Topics Concern  . Not on file  Social History Narrative   Pt is right handed   Lives in single story home with her grandson   Has 3 adult children   Associated degree    Retired Quarry manager   Social Determinants of Radio broadcast assistant Strain: [place]   . Difficulty of Paying Living Expenses: Not very hard  Food Insecurity: No Food Insecurity  . Worried About Charity fundraiser in the Last Year: Never true  . Ran Out of Food in the Last Year: Never true  Transportation Needs: No Transportation Needs  . Lack of Transportation (Medical): No  . Lack of Transportation (Non-Medical): No  Physical Activity: Insufficiently Active  . Days of Exercise per Week: 2 days  . Minutes of Exercise per Session: 20 min  Stress: No Stress Concern Present  . Feeling of Stress : Only a little  Social Connections: Not Isolated  . Frequency of Communication with Friends and Family: More than three times a week   . Frequency of Social Gatherings with Friends and Family: Twice a week  . Attends Religious Services: More than 4 times per year  . Active Member of Clubs or Organizations: Yes  . Attends Archivist Meetings: More than 4 times per year  . Marital Status:  Married  Intimate Partner Violence: Not At Risk  . Fear of Current or Ex-Partner: No  . Emotionally Abused: No  . Physically Abused: No  . Sexually Abused: No     BP 120/90   Pulse 97   Ht 5\' 4"  (1.626 m)   Wt 184 lb (83.5 kg)   SpO2 99%   BMI 31.58 kg/m   Physical Exam:  Well appearing NAD HEENT: Unremarkable Neck:  No JVD, no thyromegally Lymphatics:  No adenopathy Back:  No CVA tenderness Lungs:  Clear with no wheezes HEART:  Regular rate rhythm, no murmurs, no rubs, no clicks Abd:  soft, positive bowel sounds, no organomegally, no rebound, no guarding Ext:  2 plus pulses, no edema, no cyanosis, no clubbing Skin:  No rashes no nodules Neuro:  CN II through XII intact, motor grossly intact  EKG - atrial fib with ventricular pacing  DEVICE  Normal device function.  See PaceArt for details.   Assess/Plan: 1. Atrial fib - her VR is controlled. She is asymptomatic. 2. PPM - her Medtronic DDD PM is working normally with a h/o bundle/RV lead in the RV 3. HTN - her blood pressure is well controlled. No change in her meds.   Mikle Bosworth.D

## 2019-10-04 NOTE — Patient Instructions (Signed)
Medication Instructions:  Your physician recommends that you continue on your current medications as directed. Please refer to the Current Medication list given to you today.  Labwork: None ordered.  Testing/Procedures: None ordered.  Follow-Up: Your physician wants you to follow-up in: one year with Dr. Lovena Le.   You will receive a reminder letter in the mail two months in advance. If you don't receive a letter, please call our office to schedule the follow-up appointment.  Remote monitoring is used to monitor your Pacemaker from home. This monitoring reduces the number of office visits required to check your device to one time per year. It allows [place] to keep an eye on the functioning of your device to ensure it is working properly. You are scheduled for a device check from home on 12/07/2019. You may send your transmission at any time that day. If you have a wireless device, the transmission will be sent automatically. After your physician reviews your transmission, you will receive a postcard with your next transmission date.  Any Other Special Instructions Will Be Listed Below (If Applicable).  If you need a refill on your cardiac medications before your next appointment, please call your pharmacy.

## 2019-10-06 ENCOUNTER — Encounter: Payer: Self-pay | Admitting: Physical Therapy

## 2019-10-06 ENCOUNTER — Ambulatory Visit: Payer: Medicare HMO | Attending: Family | Admitting: Physical Therapy

## 2019-10-06 ENCOUNTER — Other Ambulatory Visit: Payer: Self-pay

## 2019-10-06 DIAGNOSIS — M25562 Pain in left knee: Secondary | ICD-10-CM | POA: Insufficient documentation

## 2019-10-06 DIAGNOSIS — M25662 Stiffness of left knee, not elsewhere classified: Secondary | ICD-10-CM | POA: Insufficient documentation

## 2019-10-06 DIAGNOSIS — M6281 Muscle weakness (generalized): Secondary | ICD-10-CM | POA: Diagnosis not present

## 2019-10-06 DIAGNOSIS — M25572 Pain in left ankle and joints of left foot: Secondary | ICD-10-CM | POA: Diagnosis not present
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
## 2019-10-07 ENCOUNTER — Other Ambulatory Visit: Payer: Self-pay | Admitting: Family

## 2019-10-07 ENCOUNTER — Telehealth: Payer: Self-pay | Admitting: Family

## 2019-10-07 DIAGNOSIS — F411 Generalized anxiety disorder: Secondary | ICD-10-CM

## 2019-10-07 DIAGNOSIS — M25572 Pain in left ankle and joints of left foot: Secondary | ICD-10-CM

## 2019-10-10 ENCOUNTER — Other Ambulatory Visit: Payer: Self-pay

## 2019-10-10 ENCOUNTER — Ambulatory Visit (INDEPENDENT_AMBULATORY_CARE_PROVIDER_SITE_OTHER): Payer: Medicare HMO

## 2019-10-10 ENCOUNTER — Other Ambulatory Visit: Payer: Self-pay | Admitting: Family

## 2019-10-10 DIAGNOSIS — R600 Localized edema: Secondary | ICD-10-CM | POA: Diagnosis not present

## 2019-10-10 DIAGNOSIS — M810 Age-related osteoporosis without current pathological fracture: Secondary | ICD-10-CM | POA: Diagnosis not present

## 2019-10-10 DIAGNOSIS — Z780 Asymptomatic menopausal state: Secondary | ICD-10-CM | POA: Diagnosis not present

## 2019-10-10 DIAGNOSIS — M25572 Pain in left ankle and joints of left foot: Secondary | ICD-10-CM | POA: Diagnosis not present

## 2019-10-10 DIAGNOSIS — M8589 Other specified disorders of bone density and structure, multiple sites: Secondary | ICD-10-CM | POA: Diagnosis not present

## 2019-10-10 NOTE — Telephone Encounter (Signed)
Aware, may come today for x-ray.

## 2019-10-10 NOTE — Telephone Encounter (Signed)
Left ankle x-ray ordered

## 2019-10-11 ENCOUNTER — Other Ambulatory Visit: Payer: Self-pay | Admitting: Family

## 2019-10-11 ENCOUNTER — Telehealth: Payer: Self-pay | Admitting: Family

## 2019-10-11 DIAGNOSIS — M810 Age-related osteoporosis without current pathological fracture: Secondary | ICD-10-CM | POA: Insufficient documentation

## 2019-10-11 MED ORDER — ALENDRONATE SODIUM 70 MG PO TABS: 70.0000 mg | ORAL_TABLET | ORAL | 11 refills | Status: DC

## 2019-10-11 NOTE — Telephone Encounter (Signed)
Reviewed XR with pt.

## 2019-10-12 ENCOUNTER — Other Ambulatory Visit: Payer: Self-pay

## 2019-10-12 ENCOUNTER — Other Ambulatory Visit: Payer: Self-pay | Admitting: Internal Medicine

## 2019-10-12 ENCOUNTER — Ambulatory Visit: Payer: Medicare HMO | Admitting: Physical Therapy

## 2019-10-12 ENCOUNTER — Encounter: Payer: Self-pay | Admitting: Physical Therapy

## 2019-10-12 DIAGNOSIS — M25662 Stiffness of left knee, not elsewhere classified: Secondary | ICD-10-CM

## 2019-10-12 DIAGNOSIS — M25572 Pain in left ankle and joints of left foot: Secondary | ICD-10-CM

## 2019-10-12 DIAGNOSIS — M25562 Pain in left knee: Secondary | ICD-10-CM | POA: Diagnosis not present

## 2019-10-12 DIAGNOSIS — M6281 Muscle weakness (generalized): Secondary | ICD-10-CM | POA: Diagnosis not present

## 2019-10-12 NOTE — Therapy (Signed)
[facility] [place], [place], [address] Phone: [phone]   Fax:  [phone]  Physical Therapy Treatment  Patient Details  Name: Sabrina [name] MRN: [id number] Date of Birth: Jul 05, 1946 Referring Provider (PT): Evelina Dun, FNP   Encounter Date: 10/12/2019  PT End of Session - 10/12/19 [id number]    Visit Number  2    Number of Visits  12    Date for PT Re-Evaluation  11/24/19    Authorization Type  KX MODIFIER AFTER 15 VISITS.  PROGRESS NOTE AT 10TH VISIT.    PT Start Time  1430    PT Stop Time  1516    PT Time Calculation (min)  46 min    Equipment Utilized During Treatment  Other (comment)   SPC   Activity Tolerance  Patient tolerated treatment well    Behavior During Therapy  WFL for tasks assessed/performed       Past Medical History:  Diagnosis Date  . Anemia   . Ankle fracture, right    past hx. -"no surgery"  . Anxiety   . Asthma   . CHF (congestive heart failure) ([place]) 2009  . Chronic lower back pain   . Collagen vascular disease ([place])   . COPD (chronic obstructive pulmonary disease) ([place])   . Depression   . Fibromyalgia   . GERD (gastroesophageal reflux disease)   . Hyperlipidemia   . Hypertension   . Immature cataract of both eyes   . Myocardial infarction ([place])    "I've had a light one; don't know when it happened" (08/27/2017)  . Osteoarthritis   . Peripheral neuropathy    legs and feet  . Persistent atrial fibrillation ([place])   . Tubular adenoma of colon     Past Surgical History:  Procedure Laterality Date  . APPENDECTOMY    . AV NODE ABLATION N/A 08/27/2017   Procedure: AV NODE ABLATION;  Surgeon: Evans Lance, MD;  Location: [facility];  Service: Cardiovascular;  Laterality: N/A;  . CHOLECYSTECTOMY OPEN  1978  . DILATION AND CURETTAGE OF UTERUS    . FEMUR FRACTURE SURGERY Left 2013   "put 7" rod in it"  . FRACTURE SURGERY    . LAPAROSCOPY  08/22/2016   Procedure:  LAPAROSCOPY DIAGNOSTIC;  Surgeon: [name], MD;  Location: [facility];  Service: General;;  . MEDIAL PARTIAL KNEE REPLACEMENT Right 2005   "@ [facility]"  . PACEMAKER IMPLANT N/A 08/27/2017   Procedure: PACEMAKER IMPLANT;  Surgeon: Evans Lance, MD;  Location: [facility];  Service: Cardiovascular;  Laterality: N/A;  . ROUX-EN-Y GASTRIC BYPASS  2002   [place]  . SPLENECTOMY  2002  . TONSILLECTOMY  1944  . TUBAL LIGATION    . VAGINAL HYSTERECTOMY      There were no vitals filed for this visit.  Subjective Assessment - 10/12/19 1436    Subjective  COVID 19 screening performed on patient upon arrival. No complaints upon arrival. Osteopenia has progressed and now taking more medication and vitamins.    Pertinent History  Pacemaker, chronic diastolic heart failure  Left femoral ORIF, MI, COPD, Fibromyalgia.    Limitations  Walking;House hold activities;Standing    How long can you stand comfortably?  short periods less than 5 minutes    How long can you walk comfortably?  short distances around the home    Diagnostic tests  none    Patient Stated Goals  decrease pain, improve movement,  improve balance    Currently in Pain?  No/denies         [facility] PT Assessment - 10/12/19 0001      Assessment   Medical Diagnosis  Weakness, Chronic pain of left knee    Referring Provider (PT)  Evelina Dun, FNP    Next MD Visit  3 months    Prior Therapy  yes      Precautions   Precautions  Fall;ICD/Pacemaker;Other (comment)      Restrictions   Weight Bearing Restrictions  No                   [facility] Adult PT Treatment/Exercise - 10/12/19 0001      Exercises   Exercises  Knee/Hip;Ankle      Knee/Hip Exercises: Aerobic   Nustep  L3 x18 min      Knee/Hip Exercises: Standing   Hip Flexion  AROM;Left;2 sets;10 reps;Knee bent    Hip Abduction  AROM;Left;2 sets;10 reps;Knee straight      Knee/Hip Exercises: Seated   Long Arc Quad  Strengthening;Left;2  sets;10 reps;Weights    Long Arc Quad Weight  3 lbs.    Clamshell with TheraBand  Red   x20 reps     Modalities   Modalities  Vasopneumatic      Vasopneumatic   Number Minutes Vasopneumatic   15 minutes    Vasopnuematic Location   Ankle    Vasopneumatic Pressure  Low    Vasopneumatic Temperature   34      Ankle Exercises: Seated   Heel Raises  Both;20 reps    Toe Raise  20 reps    Other Seated Ankle Exercises  Rockerboard x2 min seated                  PT Long Term Goals - 10/06/19 2116      PT LONG TERM GOAL #1   Title  Patient will be independent with HEP    Time  6    Period  Weeks    Status  New      PT LONG TERM GOAL #2   Title  Patient will demonstrate 115+ degrees of left knee flexion AROM to improve ability to perform functional tasks.    Time  6    Period  Weeks    Status  New      PT LONG TERM GOAL #3   Title  Patient will report ablity to perform ADLs and home activities independently with left knee pain less than or equal to 4/10.    Time  6    Period  Weeks    Status  New            Plan - 10/12/19 1519    Clinical Impression Statement  Patient presented in clinic with no complaints of any pain as she had taken prescribed medication prior to PT. Patient guided through low level LE strengthening and concluded secondary to patient reports of fatigue. Moderate edema notable in R ankle and vasopnuematic device utilized with normal response noted following removal of the modality. Patient educated to use ice with elevation at home to control edema.    Personal Factors and Comorbidities  Comorbidity 1    Examination-Activity Limitations  Locomotion Level;Transfers;Stand;Dressing;Hygiene/Grooming    Stability/Clinical Decision Making  Evolving/Moderate complexity    Rehab Potential  Fair    PT Frequency  2x / week    PT Duration  6 weeks    PT Treatment/Interventions  ADLs/Self Care Home Management;Cryotherapy;Moist Heat;Therapeutic  exercise;Therapeutic activities;Patient/family education;Manual techniques;Passive range of motion;Vasopneumatic Device;Balance training;Gait training;Stair training;Functional mobility training;Neuromuscular re-education    PT Next Visit Plan  Assess ankle pending signed certification, nustep, LE strengthening, vasopneumatic for pain relief and edema    Consulted and Agree with Plan of Care  Patient       Patient will benefit from skilled therapeutic intervention in order to improve the following deficits and impairments:  Decreased activity tolerance, Pain, Decreased strength, Decreased range of motion, Abnormal gait, Difficulty walking, Increased edema  Visit Diagnosis: Muscle weakness (generalized)  Acute pain of left knee  Acute left ankle pain  Stiffness of left knee, not elsewhere classified     Problem List Patient Active Problem List   Diagnosis Date Noted  . Osteoporosis 10/11/2019  . Pacemaker 10/04/2019  . Near syncope   . Hyperlipidemia 06/23/2016  . Colovesical fistula 06/12/2016  . Atrial fibrillation with RVR ([place]) 06/12/2016  . GERD (gastroesophageal reflux disease) 06/12/2016  . Fibromyalgia 06/12/2016  . Peripheral neuropathy 06/12/2016  . Atrial fibrillation ([place]) 06/12/2016  . S/P gastric bypass 01/22/2016  . S/P splenectomy 01/22/2016  . S/P cholecystectomy 01/22/2016  . Hx of small bowel obstruction 01/22/2016  . Asthma 10/19/2015  . Generalized anxiety disorder 10/19/2015  . Depression 10/19/2015  . Insomnia 10/19/2015  . Iron deficiency anemia 10/19/2015  . Vitamin D deficiency 11/29/2014  . Eczema 11/27/2014  . Benign essential HTN 09/25/2014  . Obesity (BMI 30-39.9) 09/25/2014  . Chronic diastolic heart failure ([place]) 09/25/2014  . Allergic rhinitis 02/28/2013    Standley Brooking, PTA 10/12/2019, 3:32 PM  [facility] [address] [place], [place], [address] Phone: [phone]   Fax:   [phone]  Name: Sabrina [name] MRN: [id number] Date of Birth: November 12, 1945

## 2019-10-12 NOTE — Telephone Encounter (Signed)
Pt last saw Dr Lovena Le 10/04/19, last labs 09/30/19 Creat 0.88, age 74, weight 83.5kg, based on specified criteria pt is on appropriate dosage of Eliquis 5mg  BID.  Will refill rx.

## 2019-10-13 ENCOUNTER — Other Ambulatory Visit: Payer: Self-pay

## 2019-10-13 ENCOUNTER — Ambulatory Visit: Payer: Medicare HMO | Admitting: Physical Therapy

## 2019-10-13 ENCOUNTER — Encounter: Payer: Self-pay | Admitting: Physical Therapy

## 2019-10-13 DIAGNOSIS — M25572 Pain in left ankle and joints of left foot: Secondary | ICD-10-CM

## 2019-10-13 DIAGNOSIS — M25662 Stiffness of left knee, not elsewhere classified: Secondary | ICD-10-CM | POA: Diagnosis not present

## 2019-10-13 DIAGNOSIS — M25562 Pain in left knee: Secondary | ICD-10-CM

## 2019-10-13 DIAGNOSIS — M6281 Muscle weakness (generalized): Secondary | ICD-10-CM | POA: Diagnosis not present

## 2019-10-13 NOTE — Therapy (Signed)
[facility] [place], [place], [address] Phone: [phone]   Fax:  [phone]  Physical Therapy Treatment  Patient Details  Name: Sabrina [name] MRN: [id number] Date of Birth: 13-Aug-1946 Referring Provider (PT): Evelina Dun, FNP   Encounter Date: 10/13/2019  PT End of Session - 10/13/19 1433    Visit Number  3    Number of Visits  12    Date for PT Re-Evaluation  11/24/19    Authorization Type  KX MODIFIER AFTER 15 VISITS.  PROGRESS NOTE AT 10TH VISIT.    PT Start Time  [place] During Treatment  Other (comment)   SPC   Activity Tolerance  Patient tolerated treatment well       Past Medical History:  Diagnosis Date  . Anemia   . Ankle fracture, right    past hx. -"no surgery"  . Anxiety   . Asthma   . CHF (congestive heart failure) ([place]) 2009  . Chronic lower back pain   . Collagen vascular disease ([place])   . COPD (chronic obstructive pulmonary disease) ([place])   . Depression   . Fibromyalgia   . GERD (gastroesophageal reflux disease)   . Hyperlipidemia   . Hypertension   . Immature cataract of both eyes   . Myocardial infarction ([place])    "I've had a light one; don't know when it happened" (08/27/2017)  . Osteoarthritis   . Peripheral neuropathy    legs and feet  . Persistent atrial fibrillation ([place])   . Tubular adenoma of colon     Past Surgical History:  Procedure Laterality Date  . APPENDECTOMY    . AV NODE ABLATION N/A 08/27/2017   Procedure: AV NODE ABLATION;  Surgeon: Evans Lance, MD;  Location: [place] [facility];  Service: Cardiovascular;  Laterality: N/A;  . CHOLECYSTECTOMY OPEN  1978  . DILATION AND CURETTAGE OF UTERUS    . FEMUR FRACTURE SURGERY Left 2013   "put 7" rod in it"  . FRACTURE SURGERY    . LAPAROSCOPY  08/22/2016   Procedure: LAPAROSCOPY DIAGNOSTIC;  Surgeon: [name], MD;  Location: [facility];  Service: General;;  . MEDIAL PARTIAL KNEE REPLACEMENT  Right 2005   "@ [facility]"  . PACEMAKER IMPLANT N/A 08/27/2017   Procedure: PACEMAKER IMPLANT;  Surgeon: Evans Lance, MD;  Location: [place] [facility];  Service: Cardiovascular;  Laterality: N/A;  . ROUX-EN-Y GASTRIC BYPASS  2002   [place]  . SPLENECTOMY  2002  . TONSILLECTOMY  1944  . TUBAL LIGATION    . VAGINAL HYSTERECTOMY      There were no vitals filed for this visit.  Subjective Assessment - 10/13/19 1435    Subjective  COVID 19 screening performed on patient upon arrival. Patient reports she took pain medicine before coming to therapy so she doesn't have pain right now.    Pertinent History  Pacemaker, chronic diastolic heart failure  Left femoral ORIF, MI, COPD, Fibromyalgia.    Limitations  Walking;House hold activities;Standing    How long can you stand comfortably?  short periods less than 5 minutes    How long can you walk comfortably?  short distances around the home    Diagnostic tests  none    Patient Stated Goals  decrease pain, improve movement, improve balance    Currently in Pain?  No/denies         [facility] PT Assessment - 10/13/19 0001  Assessment   Medical Diagnosis  Weakness, Chronic pain of left knee    Referring Provider (PT)  Evelina Dun, FNP    Next MD Visit  3 months    Prior Therapy  yes      Precautions   Precautions  Fall;ICD/Pacemaker;Other (comment)      Restrictions   Weight Bearing Restrictions  No      ROM / Strength   AROM / PROM / Strength  AROM      AROM   AROM Assessment Site  Ankle    Right/Left Knee  Left    Right/Left Ankle  Left    Left Ankle Dorsiflexion  -4    Left Ankle Plantar Flexion  10    Left Ankle Inversion  20    Left Ankle Eversion  8      Strength   Overall Strength Comments  pain in lateral aspect of left ankle with MMT    Strength Assessment Site  Ankle    Right/Left Knee  Left    Right/Left Ankle  Left    Left Ankle Dorsiflexion  4-/5    Left Ankle Inversion  4-/5    Left Ankle  Eversion  4-/5                   [facility] Adult PT Treatment/Exercise - 10/13/19 0001      Exercises   Exercises  Knee/Hip;Ankle      Knee/Hip Exercises: Aerobic   Nustep  L3 x16 min      Knee/Hip Exercises: Seated   Long Arc Quad  Strengthening;Left;2 sets;10 reps;Weights    Long Arc Quad Weight  3 lbs.    Clamshell with TheraBand  Red   x20   Hamstring Curl  Strengthening;Left;2 sets;10 reps    Sit to Sand  2 sets;10 reps      Modalities   Modalities  Vasopneumatic      Vasopneumatic   Number Minutes Vasopneumatic   15 minutes    Vasopnuematic Location   Ankle    Vasopneumatic Pressure  Low    Vasopneumatic Temperature   34      Ankle Exercises: Seated   Heel Raises  Both;20 reps    Toe Raise  20 reps                  PT Long Term Goals - 10/06/19 2116      PT LONG TERM GOAL #1   Title  Patient will be independent with HEP    Time  6    Period  Weeks    Status  New      PT LONG TERM GOAL #2   Title  Patient will demonstrate 115+ degrees of left knee flexion AROM to improve ability to perform functional tasks.    Time  6    Period  Weeks    Status  New      PT LONG TERM GOAL #3   Title  Patient will report ablity to perform ADLs and home activities independently with left knee pain less than or equal to 4/10.    Time  6    Period  Weeks    Status  New            Plan - 10/13/19 1434    Clinical Impression Statement  Left ankle was assessed and was added to plan of care per signed order. Patient responded well to therapy but with fatigue in left knee. Patient demonstrated  good form with all TEs with light increase of pain in left ankle but dissipated after rest. No adverse affects upon removal of modalities.    Personal Factors and Comorbidities  Comorbidity 1    Examination-Activity Limitations  Locomotion Level;Transfers;Stand;Dressing;Hygiene/Grooming    Stability/Clinical Decision Making  Evolving/Moderate complexity     Clinical Decision Making  Low    Rehab Potential  Fair    PT Frequency  2x / week    PT Duration  6 weeks    PT Treatment/Interventions  ADLs/Self Care Home Management;Cryotherapy;Moist Heat;Therapeutic exercise;Therapeutic activities;Patient/family education;Manual techniques;Passive range of motion;Vasopneumatic Device;Balance training;Gait training;Stair training;Functional mobility training;Neuromuscular re-education    PT Next Visit Plan  Assess ankle pending signed certification, nustep, LE strengthening, vasopneumatic for pain relief and edema    Consulted and Agree with Plan of Care  Patient       Patient will benefit from skilled therapeutic intervention in order to improve the following deficits and impairments:  Decreased activity tolerance, Pain, Decreased strength, Decreased range of motion, Abnormal gait, Difficulty walking, Increased edema  Visit Diagnosis: Muscle weakness (generalized)  Acute pain of left knee  Acute left ankle pain     Problem List Patient Active Problem List   Diagnosis Date Noted  . Osteoporosis 10/11/2019  . Pacemaker 10/04/2019  . Near syncope   . Hyperlipidemia 06/23/2016  . Colovesical fistula 06/12/2016  . Atrial fibrillation with RVR ([place]) 06/12/2016  . GERD (gastroesophageal reflux disease) 06/12/2016  . Fibromyalgia 06/12/2016  . Peripheral neuropathy 06/12/2016  . Atrial fibrillation ([place]) 06/12/2016  . S/P gastric bypass 01/22/2016  . S/P splenectomy 01/22/2016  . S/P cholecystectomy 01/22/2016  . Hx of small bowel obstruction 01/22/2016  . Asthma 10/19/2015  . Generalized anxiety disorder 10/19/2015  . Depression 10/19/2015  . Insomnia 10/19/2015  . Iron deficiency anemia 10/19/2015  . Vitamin D deficiency 11/29/2014  . Eczema 11/27/2014  . Benign essential HTN 09/25/2014  . Obesity (BMI 30-39.9) 09/25/2014  . Chronic diastolic heart failure ([place]) 09/25/2014  . Allergic rhinitis 02/28/2013    Gabriela Eves, PT,  DPT 10/13/2019, 5:36 PM  [facility] [address] [place], [place], [address] Phone: [phone]   Fax:  [phone]  Name: Sabrina [name] MRN: [id number] Date of Birth: 11-08-1945

## 2019-10-17 ENCOUNTER — Other Ambulatory Visit: Payer: Self-pay

## 2019-10-17 ENCOUNTER — Ambulatory Visit: Payer: Medicare HMO | Admitting: Physical Therapy

## 2019-10-17 ENCOUNTER — Encounter: Payer: Self-pay | Admitting: Physical Therapy

## 2019-10-17 DIAGNOSIS — M25572 Pain in left ankle and joints of left foot: Secondary | ICD-10-CM | POA: Diagnosis not present

## 2019-10-17 DIAGNOSIS — M25662 Stiffness of left knee, not elsewhere classified: Secondary | ICD-10-CM

## 2019-10-17 DIAGNOSIS — M25562 Pain in left knee: Secondary | ICD-10-CM | POA: Diagnosis not present

## 2019-10-17 DIAGNOSIS — M6281 Muscle weakness (generalized): Secondary | ICD-10-CM

## 2019-10-17 NOTE — Therapy (Signed)
[facility] [place], [place], [address] Phone: [phone]   Fax:  [phone]  Physical Therapy Treatment  Patient Details  Name: Sabrina [name] MRN: [id number] Date of Birth: 1946-02-01 Referring Provider (PT): Evelina Dun, FNP   Encounter Date: 10/17/2019  PT End of Session - 10/17/19 1459    Visit Number  4    Number of Visits  12    Date for PT Re-Evaluation  11/24/19    Authorization Type  KX MODIFIER AFTER 15 VISITS.  PROGRESS NOTE AT 10TH VISIT.    PT Start Time  1430    PT Stop Time  1518    PT Time Calculation (min)  48 min    Equipment Utilized During Treatment  Other (comment)    Activity Tolerance  Patient tolerated treatment well    Behavior During Therapy  WFL for tasks assessed/performed       Past Medical History:  Diagnosis Date  . Anemia   . Ankle fracture, right    past hx. -"no surgery"  . Anxiety   . Asthma   . CHF (congestive heart failure) ([place]) 2009  . Chronic lower back pain   . Collagen vascular disease ([place])   . COPD (chronic obstructive pulmonary disease) ([place])   . Depression   . Fibromyalgia   . GERD (gastroesophageal reflux disease)   . Hyperlipidemia   . Hypertension   . Immature cataract of both eyes   . Myocardial infarction ([place])    "I've had a light one; don't know when it happened" (08/27/2017)  . Osteoarthritis   . Peripheral neuropathy    legs and feet  . Persistent atrial fibrillation ([place])   . Tubular adenoma of colon     Past Surgical History:  Procedure Laterality Date  . APPENDECTOMY    . AV NODE ABLATION N/A 08/27/2017   Procedure: AV NODE ABLATION;  Surgeon: Evans Lance, MD;  Location: [place] [facility];  Service: Cardiovascular;  Laterality: N/A;  . CHOLECYSTECTOMY OPEN  1978  . DILATION AND CURETTAGE OF UTERUS    . FEMUR FRACTURE SURGERY Left 2013   "put 7" rod in it"  . FRACTURE SURGERY    . LAPAROSCOPY  08/22/2016   Procedure: LAPAROSCOPY  DIAGNOSTIC;  Surgeon: [name], MD;  [facility];  Service: General;;  . MEDIAL PARTIAL KNEE REPLACEMENT Right 2005   "@ [facility]"  . PACEMAKER IMPLANT N/A 08/27/2017   Procedure: PACEMAKER IMPLANT;  Surgeon: Evans Lance, MD;  Location: [place] [facility];  Service: Cardiovascular;  Laterality: N/A;  . ROUX-EN-Y GASTRIC BYPASS  2002   [place]  . SPLENECTOMY  2002  . TONSILLECTOMY  1944  . TUBAL LIGATION    . VAGINAL HYSTERECTOMY      There were no vitals filed for this visit.  Subjective Assessment - 10/17/19 1457    Subjective  COVID 19 screening performed on patient upon arrival. Patient reports ongoing pain in the morning time and at night but reports she took a pain pill so her pain "isnt' bad".    Pertinent History  Pacemaker, chronic diastolic heart failure  Left femoral ORIF, MI, COPD, Fibromyalgia.    Limitations  Walking;House hold activities;Standing    How long can you stand comfortably?  short periods less than 5 minutes    How long can you walk comfortably?  short distances around the home    Diagnostic tests  X-ray: normal, no fractures  Patient Stated Goals  decrease pain, improve movement, improve balance    Currently in Pain?  Yes   did not provide number on pain scale        [facility] PT Assessment - 10/17/19 0001      Assessment   Medical Diagnosis  Weakness, Chronic pain of left knee    Referring Provider (PT)  Evelina Dun, FNP    Next MD Visit  3 months    Prior Therapy  yes      Precautions   Precautions  Fall;ICD/Pacemaker;Other (comment)                   [place] Adult PT Treatment/Exercise - 10/17/19 0001      Exercises   Exercises  Knee/Hip;Ankle      Knee/Hip Exercises: Aerobic   Nustep  L3 x16 min      Knee/Hip Exercises: Seated   Long Arc Quad  Strengthening;Left;10 reps;Weights;3 sets    Long Arc Quad Weight  3 lbs.    Clamshell with TheraBand  Red   x30   Hamstring Curl  Strengthening;Left;10  reps;3 sets    Hamstring Limitations  red theraband      Modalities   Modalities  Vasopneumatic      Vasopneumatic   Number Minutes Vasopneumatic   15 minutes    Vasopnuematic Location   Ankle    Vasopneumatic Pressure  Low    Vasopneumatic Temperature   34      Ankle Exercises: Seated   ABC's  1 rep;Other (comment)   ankle isolator .5 pounds   Ankle Circles/Pumps  Strengthening   DF/PF x30 circles cw/ccw x30                 PT Long Term Goals - 10/06/19 2116      PT LONG TERM GOAL #1   Title  Patient will be independent with HEP    Time  6    Period  Weeks    Status  New      PT LONG TERM GOAL #2   Title  Patient will demonstrate 115+ degrees of left knee flexion AROM to improve ability to perform functional tasks.    Time  6    Period  Weeks    Status  New      PT LONG TERM GOAL #3   Title  Patient will report ablity to perform ADLs and home activities independently with left knee pain less than or equal to 4/10.    Time  6    Period  Weeks    Status  New            Plan - 10/17/19 1531    Clinical Impression Statement  Patient was able to tolerate treatment fairly well but with required verbal and tactile cuing for form and use of ankle joint rather than whole left LE. Patient noted with improved left ankle swelling but still ongoing particularly along left lateral malleolus. No adverse affects upon removal of modalities.    Personal Factors and Comorbidities  Comorbidity 1    Examination-Activity Limitations  Locomotion Level;Transfers;Stand;Dressing;Hygiene/Grooming    Stability/Clinical Decision Making  Evolving/Moderate complexity    Clinical Decision Making  Low    Rehab Potential  Fair    PT Frequency  2x / week    PT Duration  6 weeks    PT Treatment/Interventions  ADLs/Self Care Home Management;Cryotherapy;Moist Heat;Therapeutic exercise;Therapeutic activities;Patient/family education;Manual techniques;Passive range of motion;Vasopneumatic  Device;Balance training;Gait training;Stair training;Functional  mobility training;Neuromuscular re-education    PT Next Visit Plan  Nustep, LE strengthening, vasopneumatic for pain relief and edema    Consulted and Agree with Plan of Care  Patient       Patient will benefit from skilled therapeutic intervention in order to improve the following deficits and impairments:  Decreased activity tolerance, Pain, Decreased strength, Decreased range of motion, Abnormal gait, Difficulty walking, Increased edema  Visit Diagnosis: Muscle weakness (generalized)  Acute pain of left knee  Acute left ankle pain  Stiffness of left knee, not elsewhere classified     Problem List Patient Active Problem List   Diagnosis Date Noted  . Osteoporosis 10/11/2019  . Pacemaker 10/04/2019  . Near syncope   . Hyperlipidemia 06/23/2016  . Colovesical fistula 06/12/2016  . Atrial fibrillation with RVR ([place]) 06/12/2016  . GERD (gastroesophageal reflux disease) 06/12/2016  . Fibromyalgia 06/12/2016  . Peripheral neuropathy 06/12/2016  . Atrial fibrillation ([place]) 06/12/2016  . S/P gastric bypass 01/22/2016  . S/P splenectomy 01/22/2016  . S/P cholecystectomy 01/22/2016  . Hx of small bowel obstruction 01/22/2016  . Asthma 10/19/2015  . Generalized anxiety disorder 10/19/2015  . Depression 10/19/2015  . Insomnia 10/19/2015  . Iron deficiency anemia 10/19/2015  . Vitamin D deficiency 11/29/2014  . Eczema 11/27/2014  . Benign essential HTN 09/25/2014  . Obesity (BMI 30-39.9) 09/25/2014  . Chronic diastolic heart failure ([place]) 09/25/2014  . Allergic rhinitis 02/28/2013    Gabriela Eves, PT, DPT 10/17/2019, 3:43 PM  [facility] [address] [place], [place], [address] Phone: [phone]   Fax:  [phone]  Name: Sabrina [name] MRN: [id number] Date of Birth: February 26, 1946

## 2019-10-20 ENCOUNTER — Ambulatory Visit: Payer: Medicare HMO | Admitting: Physical Therapy

## 2019-10-20 ENCOUNTER — Encounter: Payer: Self-pay | Admitting: Physical Therapy

## 2019-10-20 ENCOUNTER — Other Ambulatory Visit: Payer: Self-pay

## 2019-10-20 DIAGNOSIS — M25562 Pain in left knee: Secondary | ICD-10-CM

## 2019-10-20 DIAGNOSIS — M25572 Pain in left ankle and joints of left foot: Secondary | ICD-10-CM | POA: Diagnosis not present

## 2019-10-20 DIAGNOSIS — M6281 Muscle weakness (generalized): Secondary | ICD-10-CM | POA: Diagnosis not present

## 2019-10-20 DIAGNOSIS — M25662 Stiffness of left knee, not elsewhere classified: Secondary | ICD-10-CM

## 2019-10-20 NOTE — Therapy (Signed)
[name], [place], [address] Phone: [phone]   Fax:  [phone]  Physical Therapy Treatment  Patient Details  Name: Sabrina [name] MRN: [id number] Date of Birth: 1946-07-07 Referring Provider (PT): Evelina Dun, FNP   Encounter Date: 10/20/2019  PT End of Session - 10/20/19 1508    Visit Number  5    Number of Visits  12    Date for PT Re-Evaluation  11/24/19    Authorization Type  KX MODIFIER AFTER 15 VISITS.  PROGRESS NOTE AT 10TH VISIT.    PT Start Time  0229    PT Stop Time  0323    PT Time Calculation (min)  54 min    Behavior During Therapy  [facility] for tasks assessed/performed       Past Medical History:  Diagnosis Date  . Anemia   . Ankle fracture, right    past hx. -"no surgery"  . Anxiety   . Asthma   . CHF (congestive heart failure) ([place]) 2009  . Chronic lower back pain   . Collagen vascular disease ([place])   . COPD (chronic obstructive pulmonary disease) ([place])   . Depression   . Fibromyalgia   . GERD (gastroesophageal reflux disease)   . Hyperlipidemia   . Hypertension   . Immature cataract of both eyes   . Myocardial infarction ([place])    "I've had a light one; don't know when it happened" (08/27/2017)  . Osteoarthritis   . Peripheral neuropathy    legs and feet  . Persistent atrial fibrillation ([place])   . Tubular adenoma of colon     Past Surgical History:  Procedure Laterality Date  . APPENDECTOMY    . AV NODE ABLATION N/A 08/27/2017   Procedure: AV NODE ABLATION;  Surgeon: Evans Lance, MD;  Location: [place] [facility];  Service: Cardiovascular;  Laterality: N/A;  . CHOLECYSTECTOMY OPEN  1978  . DILATION AND CURETTAGE OF UTERUS    . FEMUR FRACTURE SURGERY Left 2013   "put 7" rod in it"  . FRACTURE SURGERY    . LAPAROSCOPY  08/22/2016   Procedure: LAPAROSCOPY DIAGNOSTIC;  Surgeon: [name], MD;  [facility];  Service: General;;  . MEDIAL PARTIAL KNEE  REPLACEMENT Right 2005   "@ [facility]"  . PACEMAKER IMPLANT N/A 08/27/2017   Procedure: PACEMAKER IMPLANT;  Surgeon: Evans Lance, MD;  Location: [place] [facility];  Service: Cardiovascular;  Laterality: N/A;  . ROUX-EN-Y GASTRIC BYPASS  2002   [place]  . SPLENECTOMY  2002  . TONSILLECTOMY  1944  . TUBAL LIGATION    . VAGINAL HYSTERECTOMY      There were no vitals filed for this visit.  Subjective Assessment - 10/20/19 1434    Subjective  COVID 19 screening performed on patient upon arrival. Patient reports doing well after last treatment and good today    Pertinent History  Pacemaker, chronic diastolic heart failure  Left femoral ORIF, MI, COPD, Fibromyalgia.    Limitations  Walking;House hold activities;Standing    How long can you stand comfortably?  short periods less than 5 minutes    How long can you walk comfortably?  short distances around the home    Diagnostic tests  X-ray: normal, no fractures    Patient Stated Goals  decrease pain, improve movement, improve balance    Currently in Pain?  Yes    Pain Score  3     Pain Location  Knee   and ankle   Pain Orientation  Left    Pain Descriptors / Indicators  Aching;Discomfort;Sore    Pain Type  Acute pain    Pain Onset  More than a month ago    Pain Frequency  Constant    Aggravating Factors   prolong weight bearing    Pain Relieving Factors  rest and heat         [facility] PT Assessment - 10/20/19 0001      AROM   AROM Assessment Site  Ankle;Knee    Right/Left Knee  Left    Left Knee Flexion  90                   [facility] Adult PT Treatment/Exercise - 10/20/19 0001      Knee/Hip Exercises: Aerobic   Nustep  L3 x16 min UE/LE      Knee/Hip Exercises: Seated   Long Arc Quad  Strengthening;Left;10 reps;Weights;3 sets    [employer] Weight  4 lbs.    Clamshell with TheraBand  Red   x30   Hamstring Curl  Strengthening;Left;10 reps;3 sets    Hamstring Limitations  red theraband       Knee/Hip Exercises: Supine   Straight Leg Raises  Strengthening;Left;10 reps;2 sets      Vasopneumatic   Number Minutes Vasopneumatic   15 minutes    Vasopnuematic Location   Ankle    Vasopneumatic Pressure  Low    Vasopneumatic Temperature   34 for edema      Ankle Exercises: Supine   T-Band  4 way yellow t-band 2x10 each                  PT Long Term Goals - 10/20/19 1437      PT LONG TERM GOAL #1   Title  Patient will be independent with HEP    Time  6    Period  Weeks    Status  On-going      PT LONG TERM GOAL #2   Title  Patient will demonstrate 115+ degrees of left knee flexion AROM to improve ability to perform functional tasks.    Time  6    Period  Weeks    Status  On-going   AROM 90 degrees 10/20/19     PT LONG TERM GOAL #3   Title  Patient will report ablity to perform ADLs and home activities independently with left knee pain less than or equal to 4/10.    Time  6    Period  Weeks    Status  On-going   able to perform with greater ease yet steps are difficult 10/20/19           Plan - 10/20/19 1509    Clinical Impression Statement  Patient tolerated treatment well today and able to progress with all exercises for ankle and knee. Patient feels she is able to perform ADL's with greater ease yet ongoing difficulty with stairs. Patient has decreased ROM in left knee today due to pain with bening today. Current goals progressing at this time.    Personal Factors and Comorbidities  Comorbidity 1    Examination-Activity Limitations  Locomotion Level;Transfers;Stand;Dressing;Hygiene/Grooming    Stability/Clinical Decision Making  Evolving/Moderate complexity    Rehab Potential  Fair    PT Frequency  2x / week    PT Duration  6 weeks    PT Treatment/Interventions  ADLs/Self Care Home Management;Cryotherapy;Moist Heat;Therapeutic exercise;Therapeutic activities;Patient/family education;Manual techniques;Passive  range of motion;Vasopneumatic Device;Balance  training;Gait training;Stair training;Functional mobility training;Neuromuscular re-education    PT Next Visit Plan  Nustep, LE strengthening, vasopneumatic for pain relief and edema    Consulted and Agree with Plan of Care  Patient       Patient will benefit from skilled therapeutic intervention in order to improve the following deficits and impairments:  Decreased activity tolerance, Pain, Decreased strength, Decreased range of motion, Abnormal gait, Difficulty walking, Increased edema  Visit Diagnosis: Acute pain of left knee  Muscle weakness (generalized)  Acute left ankle pain  Stiffness of left knee, not elsewhere classified     Problem List Patient Active Problem List   Diagnosis Date Noted  . Osteoporosis 10/11/2019  . Pacemaker 10/04/2019  . Near syncope   . Hyperlipidemia 06/23/2016  . Colovesical fistula 06/12/2016  . Atrial fibrillation with RVR ([place]) 06/12/2016  . GERD (gastroesophageal reflux disease) 06/12/2016  . Fibromyalgia 06/12/2016  . Peripheral neuropathy 06/12/2016  . Atrial fibrillation ([place]) 06/12/2016  . S/P gastric bypass 01/22/2016  . S/P splenectomy 01/22/2016  . S/P cholecystectomy 01/22/2016  . Hx of small bowel obstruction 01/22/2016  . Asthma 10/19/2015  . Generalized anxiety disorder 10/19/2015  . Depression 10/19/2015  . Insomnia 10/19/2015  . Iron deficiency anemia 10/19/2015  . Vitamin D deficiency 11/29/2014  . Eczema 11/27/2014  . Benign essential HTN 09/25/2014  . Obesity (BMI 30-39.9) 09/25/2014  . Chronic diastolic heart failure ([place]) 09/25/2014  . Allergic rhinitis 02/28/2013    Glenroy Crossen P, PTA 10/20/2019, 3:27 PM  [facility] [place], [place], [address] Phone: [phone]   Fax:  [phone]  Name: JOSLIN BEATY MRN: [id number] Date of Birth: 08-17-1946

## 2019-10-25 ENCOUNTER — Ambulatory Visit: Payer: Medicare HMO | Attending: Family | Admitting: Physical Therapy

## 2019-10-25 ENCOUNTER — Other Ambulatory Visit: Payer: Self-pay

## 2019-10-25 ENCOUNTER — Encounter: Payer: Self-pay | Admitting: Physical Therapy

## 2019-10-25 DIAGNOSIS — M6281 Muscle weakness (generalized): Secondary | ICD-10-CM | POA: Diagnosis not present

## 2019-10-25 DIAGNOSIS — M25572 Pain in left ankle and joints of left foot: Secondary | ICD-10-CM | POA: Diagnosis not present

## 2019-10-25 DIAGNOSIS — M25662 Stiffness of left knee, not elsewhere classified: Secondary | ICD-10-CM | POA: Insufficient documentation

## 2019-10-25 DIAGNOSIS — M25562 Pain in left knee: Secondary | ICD-10-CM | POA: Insufficient documentation

## 2019-10-25 NOTE — Therapy (Signed)
[facility] [place], [place], [address] Phone: [phone]   Fax:  [phone]  Physical Therapy Treatment  Patient Details  Name: Sabrina [name] MRN: [id number] Date of Birth: 19-Aug-1946 Referring Provider (PT): Evelina Dun, FNP   Encounter Date: 10/25/2019  PT End of Session - 10/25/19 1541    Visit Number  6    Number of Visits  12    Date for PT Re-Evaluation  11/24/19    Authorization Type  KX MODIFIER AFTER 15 VISITS.  PROGRESS NOTE AT 10TH VISIT.    PT Start Time  1515    PT Stop Time  1607    PT Time Calculation (min)  52 min    Equipment Utilized During Treatment  Other (comment)   hurry cane   Activity Tolerance  Patient tolerated treatment well    Behavior During Therapy  WFL for tasks assessed/performed       Past Medical History:  Diagnosis Date  . Anemia   . Ankle fracture, right    past hx. -"no surgery"  . Anxiety   . Asthma   . CHF (congestive heart failure) ([place]) 2009  . Chronic lower back pain   . Collagen vascular disease ([place])   . COPD (chronic obstructive pulmonary disease) ([place])   . Depression   . Fibromyalgia   . GERD (gastroesophageal reflux disease)   . Hyperlipidemia   . Hypertension   . Immature cataract of both eyes   . Myocardial infarction ([place])    "I've had a light one; don't know when it happened" (08/27/2017)  . Osteoarthritis   . Peripheral neuropathy    legs and feet  . Persistent atrial fibrillation ([place])   . Tubular adenoma of colon     Past Surgical History:  Procedure Laterality Date  . APPENDECTOMY    . AV NODE ABLATION N/A 08/27/2017   Procedure: AV NODE ABLATION;  Surgeon: Evans Lance, MD;  Location: [facility];  Service: Cardiovascular;  Laterality: N/A;  . CHOLECYSTECTOMY OPEN  1978  . DILATION AND CURETTAGE OF UTERUS    . FEMUR FRACTURE SURGERY Left 2013   "put 7" rod in it"  . FRACTURE SURGERY    . LAPAROSCOPY  08/22/2016   Procedure:  LAPAROSCOPY DIAGNOSTIC;  Surgeon: [name], MD;  Location: [facility];  Service: General;;  . MEDIAL PARTIAL KNEE REPLACEMENT Right 2005   "@ [facility]"  . PACEMAKER IMPLANT N/A 08/27/2017   Procedure: PACEMAKER IMPLANT;  Surgeon: Evans Lance, MD;  Location: [facility];  Service: Cardiovascular;  Laterality: N/A;  . ROUX-EN-Y GASTRIC BYPASS  2002   [place]  . SPLENECTOMY  2002  . TONSILLECTOMY  1944  . TUBAL LIGATION    . VAGINAL HYSTERECTOMY      There were no vitals filed for this visit.  Subjective Assessment - 10/25/19 1540    Subjective  COVID 19 screening performed on patient upon arrival. Patient reports no falls since start of therapy.    Pertinent History  Pacemaker, chronic diastolic heart failure  Left femoral ORIF, MI, COPD, Fibromyalgia.    Limitations  Walking;House hold activities;Standing    How long can you stand comfortably?  short periods less than 5 minutes    How long can you walk comfortably?  short distances around the home    Diagnostic tests  X-ray: normal, no fractures    Patient Stated Goals  decrease pain, improve movement, improve balance  Currently in Pain?  Yes    Pain Score  4     Pain Location  Knee   and Ankle   Pain Orientation  Left    Pain Descriptors / Indicators  Aching;Sore    Pain Type  Acute pain    Pain Onset  More than a month ago         [facility] PT Assessment - 10/25/19 0001      Assessment   Medical Diagnosis  Weakness, Chronic pain of left knee    Referring Provider (PT)  Evelina Dun, FNP    Next MD Visit  3 months    Prior Therapy  yes      Precautions   Precautions  Fall;ICD/Pacemaker;Other (comment)                   [place] Adult PT Treatment/Exercise - 10/25/19 0001      Exercises   Exercises  Knee/Hip;Ankle      Knee/Hip Exercises: Aerobic   Nustep  L3 x16 min UE/LE      Knee/Hip Exercises: Standing   Hip Flexion  AROM;Left;2 sets;10 reps;Knee bent    Rocker Board  3  minutes    Other Standing Knee Exercises  lateral stepping in parallel bars x3 mins    Other Standing Knee Exercises  6" step tapping with UE support x20       Modalities   Modalities  Vasopneumatic      Vasopneumatic   Number Minutes Vasopneumatic   15 minutes    Vasopnuematic Location   Ankle    Vasopneumatic Pressure  Low    Vasopneumatic Temperature   34      Ankle Exercises: Standing   Heel Raises  Both;20 reps    Toe Raise  20 reps                  PT Long Term Goals - 10/20/19 1437      PT LONG TERM GOAL #1   Title  Patient will be independent with HEP    Time  6    Period  Weeks    Status  On-going      PT LONG TERM GOAL #2   Title  Patient will demonstrate 115+ degrees of left knee flexion AROM to improve ability to perform functional tasks.    Time  6    Period  Weeks    Status  On-going   AROM 90 degrees 10/20/19     PT LONG TERM GOAL #3   Title  Patient will report ablity to perform ADLs and home activities independently with left knee pain less than or equal to 4/10.    Time  6    Period  Weeks    Status  On-going   able to perform with greater ease yet steps are difficult 10/20/19           Plan - 10/25/19 1609    Clinical Impression Statement  Patient responded fairly well to standing TEs with no complaints of increased pain. Patient provided with verbal and tactile cuing for form and technique to prevent hip external rotation. No adverse affects upon removal of vasopneumatic device.    Personal Factors and Comorbidities  Comorbidity 1    Examination-Activity Limitations  Locomotion Level;Transfers;Stand;Dressing;Hygiene/Grooming    Stability/Clinical Decision Making  Evolving/Moderate complexity    Clinical Decision Making  Low    Rehab Potential  Fair    PT Frequency  2x / week  PT Duration  6 weeks    PT Treatment/Interventions  ADLs/Self Care Home Management;Cryotherapy;Moist Heat;Therapeutic exercise;Therapeutic  activities;Patient/family education;Manual techniques;Passive range of motion;Vasopneumatic Device;Balance training;Gait training;Stair training;Functional mobility training;Neuromuscular re-education    PT Next Visit Plan  Nustep, LE strengthening, vasopneumatic for pain relief and edema    Consulted and Agree with Plan of Care  Patient       Patient will benefit from skilled therapeutic intervention in order to improve the following deficits and impairments:  Decreased activity tolerance, Pain, Decreased strength, Decreased range of motion, Abnormal gait, Difficulty walking, Increased edema  Visit Diagnosis: Acute pain of left knee  Muscle weakness (generalized)  Acute left ankle pain  Stiffness of left knee, not elsewhere classified     Problem List Patient Active Problem List   Diagnosis Date Noted  . Osteoporosis 10/11/2019  . Pacemaker 10/04/2019  . Near syncope   . Hyperlipidemia 06/23/2016  . Colovesical fistula 06/12/2016  . Atrial fibrillation with RVR ([place]) 06/12/2016  . GERD (gastroesophageal reflux disease) 06/12/2016  . Fibromyalgia 06/12/2016  . Peripheral neuropathy 06/12/2016  . Atrial fibrillation ([place]) 06/12/2016  . S/P gastric bypass 01/22/2016  . S/P splenectomy 01/22/2016  . S/P cholecystectomy 01/22/2016  . Hx of small bowel obstruction 01/22/2016  . Asthma 10/19/2015  . Generalized anxiety disorder 10/19/2015  . Depression 10/19/2015  . Insomnia 10/19/2015  . Iron deficiency anemia 10/19/2015  . Vitamin D deficiency 11/29/2014  . Eczema 11/27/2014  . Benign essential HTN 09/25/2014  . Obesity (BMI 30-39.9) 09/25/2014  . Chronic diastolic heart failure ([place]) 09/25/2014  . Allergic rhinitis 02/28/2013    Gabriela Eves, PT, DPT 10/25/2019, 4:40 PM  [facility] [address] [place], [place], [address] Phone: [phone]   Fax:  [phone]  Name: Sabrina [name] MRN: [id number] Date of  Birth: 1946-08-02

## 2019-10-27 ENCOUNTER — Other Ambulatory Visit: Payer: Self-pay

## 2019-10-27 ENCOUNTER — Ambulatory Visit: Payer: Medicare HMO | Admitting: Physical Therapy

## 2019-10-27 DIAGNOSIS — M25562 Pain in left knee: Secondary | ICD-10-CM

## 2019-10-27 DIAGNOSIS — M25662 Stiffness of left knee, not elsewhere classified: Secondary | ICD-10-CM | POA: Diagnosis not present

## 2019-10-27 DIAGNOSIS — M25572 Pain in left ankle and joints of left foot: Secondary | ICD-10-CM | POA: Diagnosis not present

## 2019-10-27 DIAGNOSIS — M6281 Muscle weakness (generalized): Secondary | ICD-10-CM

## 2019-10-27 NOTE — Therapy (Signed)
[facility] [place], [place], [address] Phone: [phone]   Fax:  [phone]  Physical Therapy Treatment  Patient Details  Name: Sabrina [name] MRN: [id number] Date of Birth: 10-Feb-1946 Referring Provider (PT): Evelina Dun, FNP   Encounter Date: 10/27/2019  PT End of Session - 10/27/19 1449    Visit Number  7    Number of Visits  12    Date for PT Re-Evaluation  11/24/19    Authorization Type  KX MODIFIER AFTER 15 VISITS.  PROGRESS NOTE AT 10TH VISIT.    PT Start Time  1431    PT Stop Time  1521    PT Time Calculation (min)  50 min    Activity Tolerance  Patient tolerated treatment well    Behavior During Therapy  WFL for tasks assessed/performed       Past Medical History:  Diagnosis Date  . Anemia   . Ankle fracture, right    past hx. -"no surgery"  . Anxiety   . Asthma   . CHF (congestive heart failure) ([place]) 2009  . Chronic lower back pain   . Collagen vascular disease ([place])   . COPD (chronic obstructive pulmonary disease) ([place])   . Depression   . Fibromyalgia   . GERD (gastroesophageal reflux disease)   . Hyperlipidemia   . Hypertension   . Immature cataract of both eyes   . Myocardial infarction ([place])    "I've had a light one; don't know when it happened" (08/27/2017)  . Osteoarthritis   . Peripheral neuropathy    legs and feet  . Persistent atrial fibrillation ([place])   . Tubular adenoma of colon     Past Surgical History:  Procedure Laterality Date  . APPENDECTOMY    . AV NODE ABLATION N/A 08/27/2017   Procedure: AV NODE ABLATION;  Surgeon: Evans Lance, MD;  Location: [place] [facility] [facility];  Service: Cardiovascular;  Laterality: N/A;  . CHOLECYSTECTOMY OPEN  1978  . DILATION AND CURETTAGE OF UTERUS    . FEMUR FRACTURE SURGERY Left 2013   "put 7" rod in it"  . FRACTURE SURGERY    . LAPAROSCOPY  08/22/2016   Procedure: LAPAROSCOPY DIAGNOSTIC;  Surgeon: [name], MD;  [facility];  Service: General;;  . MEDIAL PARTIAL KNEE REPLACEMENT Right 2005   "@ [facility]"  . PACEMAKER IMPLANT N/A 08/27/2017   Procedure: PACEMAKER IMPLANT;  Surgeon: Evans Lance, MD;  Location: [facility];  Service: Cardiovascular;  Laterality: N/A;  . ROUX-EN-Y GASTRIC BYPASS  2002   [place]  . SPLENECTOMY  2002  . TONSILLECTOMY  1944  . TUBAL LIGATION    . VAGINAL HYSTERECTOMY      There were no vitals filed for this visit.  Subjective Assessment - 10/27/19 1445    Subjective  COVID 19 screening performed on patient upon arrival. Patient reports some medial left knee pain today but otherwise doing well    Pertinent History  Pacemaker, chronic diastolic heart failure  Left femoral ORIF, MI, COPD, Fibromyalgia.    Limitations  Walking;House hold activities;Standing    How long can you stand comfortably?  short periods less than 5 minutes    How long can you walk comfortably?  short distances around the home    Diagnostic tests  X-ray: normal, no fractures    Patient Stated Goals  decrease pain, improve movement, improve balance    Currently in Pain?  Yes  Pain Score  4     Pain Location  Knee    Pain Orientation  Left    Pain Type  Acute pain    Pain Onset  More than a month ago    Pain Frequency  Constant         [facility] PT Assessment - 10/27/19 0001      Assessment   Medical Diagnosis  Weakness, Chronic pain of left knee    Referring Provider (PT)  Evelina Dun, FNP    Next MD Visit  3 months    Prior Therapy  yes      Precautions   Precautions  Fall;ICD/Pacemaker;Other (comment)                   [place] Adult PT Treatment/Exercise - 10/27/19 0001      Exercises   Exercises  Knee/Hip;Ankle      Knee/Hip Exercises: Aerobic   Nustep  L4 x16 min UE/LE      Knee/Hip Exercises: Standing   Rocker Board  3 minutes      Knee/Hip Exercises: Seated   Long Arc Quad  Strengthening;Left;10 reps;Weights;3 sets    Long Arc Quad Weight   4 lbs.    Clamshell with TheraBand  Red   x20   Marching  Strengthening;Both;2 sets;10 reps    Hamstring Curl  Strengthening;Left;10 reps;3 sets    Hamstring Limitations  red theraband      Modalities   Modalities  Vasopneumatic      Vasopneumatic   Number Minutes Vasopneumatic   15 minutes    Vasopnuematic Location   Ankle    Vasopneumatic Pressure  Low    Vasopneumatic Temperature   34      Ankle Exercises: Standing   Heel Raises  Both;20 reps    Toe Raise  20 reps      Ankle Exercises: Supine   T-Band  4 way yellow t-band 2x10 each                  PT Long Term Goals - 10/20/19 1437      PT LONG TERM GOAL #1   Title  Patient will be independent with HEP    Time  6    Period  Weeks    Status  On-going      PT LONG TERM GOAL #2   Title  Patient will demonstrate 115+ degrees of left knee flexion AROM to improve ability to perform functional tasks.    Time  6    Period  Weeks    Status  On-going   AROM 90 degrees 10/20/19     PT LONG TERM GOAL #3   Title  Patient will report ablity to perform ADLs and home activities independently with left knee pain less than or equal to 4/10.    Time  6    Period  Weeks    Status  On-going   able to perform with greater ease yet steps are difficult 10/20/19           Plan - 10/27/19 1503    Clinical Impression Statement  Patient responded well to therapy session with no reports of pain. Patient noted with improved form with heel raises but still requires intermittent verbal cuing to prevent leaning backward with toe raises. Patient noted with visible improvements with edema of left ankle. No adverse affects upon removal of modalities.    Personal Factors and Comorbidities  Comorbidity 1    Examination-Activity  Limitations  Locomotion Level;Transfers;Stand;Dressing;Hygiene/Grooming    Stability/Clinical Decision Making  Evolving/Moderate complexity    Clinical Decision Making  Low    Rehab Potential  Fair    PT  Frequency  2x / week    PT Duration  6 weeks    PT Treatment/Interventions  ADLs/Self Care Home Management;Cryotherapy;Moist Heat;Therapeutic exercise;Therapeutic activities;Patient/family education;Manual techniques;Passive range of motion;Vasopneumatic Device;Balance training;Gait training;Stair training;Functional mobility training;Neuromuscular re-education    PT Next Visit Plan  Continue Nustep, LE strengthening, vasopneumatic for pain relief and edema    Consulted and Agree with Plan of Care  Patient       Patient will benefit from skilled therapeutic intervention in order to improve the following deficits and impairments:  Decreased activity tolerance, Pain, Decreased strength, Decreased range of motion, Abnormal gait, Difficulty walking, Increased edema  Visit Diagnosis: Acute pain of left knee  Muscle weakness (generalized)  Acute left ankle pain  Stiffness of left knee, not elsewhere classified     Problem List Patient Active Problem List   Diagnosis Date Noted  . Osteoporosis 10/11/2019  . Pacemaker 10/04/2019  . Near syncope   . Hyperlipidemia 06/23/2016  . Colovesical fistula 06/12/2016  . Atrial fibrillation with RVR ([place]) 06/12/2016  . GERD (gastroesophageal reflux disease) 06/12/2016  . Fibromyalgia 06/12/2016  . Peripheral neuropathy 06/12/2016  . Atrial fibrillation ([place]) 06/12/2016  . S/P gastric bypass 01/22/2016  . S/P splenectomy 01/22/2016  . S/P cholecystectomy 01/22/2016  . Hx of small bowel obstruction 01/22/2016  . Asthma 10/19/2015  . Generalized anxiety disorder 10/19/2015  . Depression 10/19/2015  . Insomnia 10/19/2015  . Iron deficiency anemia 10/19/2015  . Vitamin D deficiency 11/29/2014  . Eczema 11/27/2014  . Benign essential HTN 09/25/2014  . Obesity (BMI 30-39.9) 09/25/2014  . Chronic diastolic heart failure ([place]) 09/25/2014  . Allergic rhinitis 02/28/2013    Gabriela Eves, PT, DPT 10/27/2019, 3:31 PM  [facility] [address] [place], [place], [address] Phone: [phone]   Fax:  [phone]  Name: Sabrina [name] MRN: [id number] Date of Birth: April 07, 1946

## 2019-10-31 ENCOUNTER — Other Ambulatory Visit: Payer: Self-pay

## 2019-10-31 ENCOUNTER — Ambulatory Visit: Payer: Medicare HMO | Admitting: Physical Therapy

## 2019-10-31 ENCOUNTER — Encounter: Payer: Self-pay | Admitting: Physical Therapy

## 2019-10-31 DIAGNOSIS — M25562 Pain in left knee: Secondary | ICD-10-CM

## 2019-10-31 DIAGNOSIS — M25662 Stiffness of left knee, not elsewhere classified: Secondary | ICD-10-CM | POA: Diagnosis not present

## 2019-10-31 DIAGNOSIS — M25572 Pain in left ankle and joints of left foot: Secondary | ICD-10-CM

## 2019-10-31 DIAGNOSIS — M6281 Muscle weakness (generalized): Secondary | ICD-10-CM

## 2019-10-31 NOTE — Therapy (Signed)
[facility] [place], [place], [address] Phone: [phone]   Fax:  [phone]  Physical Therapy Treatment  Patient Details  Name: Sabrina [name] MRN: [id number] Date of Birth: [birth year] Referring Provider (PT): Evelina Dun, FNP   Encounter Date: 10/31/2019  PT End of Session - 10/31/19 1506    Visit Number  8    Number of Visits  12    Date for PT Re-Evaluation  11/24/19    Authorization Type  KX MODIFIER AFTER 15 VISITS.  PROGRESS NOTE AT 10TH VISIT.    PT Start Time  1433    PT Stop Time  1517    PT Time Calculation (min)  44 min    Equipment Utilized During Treatment  Other (comment)   SPC   Activity Tolerance  Patient tolerated treatment well    Behavior During Therapy  WFL for tasks assessed/performed       Past Medical History:  Diagnosis Date  . Anemia   . Ankle fracture, right    past hx. -"no surgery"  . Anxiety   . Asthma   . CHF (congestive heart failure) ([place]) 2009  . Chronic lower back pain   . Collagen vascular disease ([place])   . COPD (chronic obstructive pulmonary disease) ([place])   . Depression   . Fibromyalgia   . GERD (gastroesophageal reflux disease)   . Hyperlipidemia   . Hypertension   . Immature cataract of both eyes   . Myocardial infarction ([place])    "I've had a light one; don't know when it happened" (08/27/2017)  . Osteoarthritis   . Peripheral neuropathy    legs and feet  . Persistent atrial fibrillation ([place])   . Tubular adenoma of colon     Past Surgical History:  Procedure Laterality Date  . APPENDECTOMY    . AV NODE ABLATION N/A 08/27/2017   Procedure: AV NODE ABLATION;  Surgeon: Evans Lance, MD;  Location: [facility];  Service: Cardiovascular;  Laterality: N/A;  . CHOLECYSTECTOMY OPEN  1978  . DILATION AND CURETTAGE OF UTERUS    . FEMUR FRACTURE SURGERY Left 2013   "put 7" rod in it"  . FRACTURE SURGERY    . LAPAROSCOPY  08/22/2016   Procedure:  LAPAROSCOPY DIAGNOSTIC;  Surgeon: [name], MD;  [facility];  Service: General;;  . MEDIAL PARTIAL KNEE REPLACEMENT Right 2005   "@ [facility]"  . PACEMAKER IMPLANT N/A 08/27/2017   Procedure: PACEMAKER IMPLANT;  Surgeon: Evans Lance, MD;  Location: [facility];  Service: Cardiovascular;  Laterality: N/A;  . ROUX-EN-Y GASTRIC BYPASS  2002   [place]  . SPLENECTOMY  2002  . TONSILLECTOMY  1944  . TUBAL LIGATION    . VAGINAL HYSTERECTOMY      There were no vitals filed for this visit.  Subjective Assessment - 10/31/19 1434    Subjective  COVID 19 screening performed on patient upon arrival. Reports that cold weather is really affecting her.    Pertinent History  Pacemaker, chronic diastolic heart failure  Left femoral ORIF, MI, COPD, Fibromyalgia.    Limitations  Walking;House hold activities;Standing    How long can you stand comfortably?  short periods less than 5 minutes    How long can you walk comfortably?  short distances around the home    Diagnostic tests  X-ray: normal, no fractures    Patient Stated Goals  decrease pain, improve movement, improve balance  Currently in Pain?  Yes    Pain Score  3     Pain Location  Knee    Pain Orientation  Left    Pain Descriptors / Indicators  Discomfort    Pain Type  Acute pain    Pain Onset  More than a month ago    Pain Frequency  Constant    Multiple Pain Sites  Yes    Pain Score  4    Pain Location  Ankle    Pain Orientation  Left    Pain Descriptors / Indicators  Discomfort    Pain Type  Acute pain    Pain Onset  More than a month ago    Pain Frequency  Constant         [facility] PT Assessment - 10/31/19 0001      Assessment   Medical Diagnosis  Weakness, Chronic pain of left knee    Referring Provider (PT)  Evelina Dun, FNP    Next MD Visit  3 months    Prior Therapy  yes      Precautions   Precautions  Fall;ICD/Pacemaker;Other (comment)                   [facility] Adult  PT Treatment/Exercise - 10/31/19 0001      Knee/Hip Exercises: Aerobic   Nustep  L4 x16 min UE/LE      Knee/Hip Exercises: Seated   Long Arc Quad  Strengthening;Left;10 reps;Weights;3 sets    [employer] Weight  4 lbs.    Clamshell with TheraBand  Red   x20 reps   Marching  Strengthening;Left;2 sets;10 reps;Weights    Marching Limitations  4    Hamstring Curl  Strengthening;Left;3 sets;10 reps    Hamstring Limitations  red theraband      Modalities   Modalities  Vasopneumatic      Vasopneumatic   Number Minutes Vasopneumatic   10 minutes    Vasopnuematic Location   Ankle    Vasopneumatic Pressure  Low    Vasopneumatic Temperature   34      Ankle Exercises: Seated   Other Seated Ankle Exercises  4D L ankle strengthening yellow theraband x20 reps each                  PT Long Term Goals - 10/20/19 1437      PT LONG TERM GOAL #1   Title  Patient will be independent with HEP    Time  6    Period  Weeks    Status  On-going      PT LONG TERM GOAL #2   Title  Patient will demonstrate 115+ degrees of left knee flexion AROM to improve ability to perform functional tasks.    Time  6    Period  Weeks    Status  On-going   AROM 90 degrees 10/20/19     PT LONG TERM GOAL #3   Title  Patient will report ablity to perform ADLs and home activities independently with left knee pain less than or equal to 4/10.    Time  6    Period  Weeks    Status  On-going   able to perform with greater ease yet steps are difficult 10/20/19           Plan - 10/31/19 1532    Clinical Impression Statement  Patient presented in clinic with reports of discomfort in L knee and ankle which she relates to  cold weather. Patient guided through resisted knee and ankle strengthening but no complaints of increased pain. Multimodal cueing required throughout therex to promote proper technique especially with ankle inversion and eversion. Normal vasopnuematic response noted following removal of  the modality.    Personal Factors and Comorbidities  Comorbidity 1    Examination-Activity Limitations  Locomotion Level;Transfers;Stand;Dressing;Hygiene/Grooming    Stability/Clinical Decision Making  Evolving/Moderate complexity    Rehab Potential  Fair    PT Frequency  2x / week    PT Duration  6 weeks    PT Treatment/Interventions  ADLs/Self Care Home Management;Cryotherapy;Moist Heat;Therapeutic exercise;Therapeutic activities;Patient/family education;Manual techniques;Passive range of motion;Vasopneumatic Device;Balance training;Gait training;Stair training;Functional mobility training;Neuromuscular re-education    PT Next Visit Plan  Continue Nustep, LE strengthening, vasopneumatic for pain relief and edema    Consulted and Agree with Plan of Care  Patient       Patient will benefit from skilled therapeutic intervention in order to improve the following deficits and impairments:  Decreased activity tolerance, Pain, Decreased strength, Decreased range of motion, Abnormal gait, Difficulty walking, Increased edema  Visit Diagnosis: Acute pain of left knee  Muscle weakness (generalized)  Acute left ankle pain  Stiffness of left knee, not elsewhere classified     Problem List Patient Active Problem List   Diagnosis Date Noted  . Osteoporosis 10/11/2019  . Pacemaker 10/04/2019  . Near syncope   . Hyperlipidemia 06/23/2016  . Colovesical fistula 06/12/2016  . Atrial fibrillation with RVR ([place]) 06/12/2016  . GERD (gastroesophageal reflux disease) 06/12/2016  . Fibromyalgia 06/12/2016  . Peripheral neuropathy 06/12/2016  . Atrial fibrillation ([place]) 06/12/2016  . S/P gastric bypass 01/22/2016  . S/P splenectomy 01/22/2016  . S/P cholecystectomy 01/22/2016  . Hx of small bowel obstruction 01/22/2016  . Asthma 10/19/2015  . Generalized anxiety disorder 10/19/2015  . Depression 10/19/2015  . Insomnia 10/19/2015  . Iron deficiency anemia 10/19/2015  . Vitamin D deficiency  11/29/2014  . Eczema 11/27/2014  . Benign essential HTN 09/25/2014  . Obesity (BMI 30-39.9) 09/25/2014  . Chronic diastolic heart failure ([place]) 09/25/2014  . Allergic rhinitis 02/28/2013    Standley Brooking, PTA 10/31/2019, 3:38 PM  [facility] [address] [place], [place], [address] Phone: [phone]   Fax:  [phone]  Name: Sabrina [name] MRN: [id number] Date of Birth: [birth year]

## 2019-11-03 ENCOUNTER — Other Ambulatory Visit: Payer: Self-pay

## 2019-11-03 ENCOUNTER — Ambulatory Visit: Payer: Medicare HMO | Admitting: Physical Therapy

## 2019-11-03 DIAGNOSIS — M25572 Pain in left ankle and joints of left foot: Secondary | ICD-10-CM

## 2019-11-03 DIAGNOSIS — M25562 Pain in left knee: Secondary | ICD-10-CM | POA: Diagnosis not present

## 2019-11-03 DIAGNOSIS — M6281 Muscle weakness (generalized): Secondary | ICD-10-CM

## 2019-11-03 DIAGNOSIS — M25662 Stiffness of left knee, not elsewhere classified: Secondary | ICD-10-CM | POA: Diagnosis not present

## 2019-11-03 NOTE — Therapy (Signed)
[facility] [place], [place], [address] Phone: [phone]   Fax:  [phone]  Physical Therapy Treatment  Patient Details  Name: Sabrina [name] MRN: [id number] Date of Birth: 1946-02-19 Referring Provider (PT): Evelina Dun, FNP   Encounter Date: 11/03/2019  PT End of Session - 11/03/19 1523    Visit Number  9    Number of Visits  12    Date for PT Re-Evaluation  11/24/19    Authorization Type  KX MODIFIER AFTER 15 VISITS.  PROGRESS NOTE AT 10TH VISIT.    PT Start Time  1430    PT Stop Time  1517    PT Time Calculation (min)  47 min    Activity Tolerance  Patient tolerated treatment well    Behavior During Therapy  WFL for tasks assessed/performed       Past Medical History:  Diagnosis Date  . Anemia   . Ankle fracture, right    past hx. -"no surgery"  . Anxiety   . Asthma   . CHF (congestive heart failure) ([place]) 2009  . Chronic lower back pain   . Collagen vascular disease ([place])   . COPD (chronic obstructive pulmonary disease) ([place])   . Depression   . Fibromyalgia   . GERD (gastroesophageal reflux disease)   . Hyperlipidemia   . Hypertension   . Immature cataract of both eyes   . Myocardial infarction ([place])    "I've had a light one; don't know when it happened" (08/27/2017)  . Osteoarthritis   . Peripheral neuropathy    legs and feet  . Persistent atrial fibrillation ([place])   . Tubular adenoma of colon     Past Surgical History:  Procedure Laterality Date  . APPENDECTOMY    . AV NODE ABLATION N/A 08/27/2017   Procedure: AV NODE ABLATION;  Surgeon: Evans Lance, MD;  Location: [place] [facility];  Service: Cardiovascular;  Laterality: N/A;  . CHOLECYSTECTOMY OPEN  1978  . DILATION AND CURETTAGE OF UTERUS    . FEMUR FRACTURE SURGERY Left 2013   "put 7" rod in it"  . FRACTURE SURGERY    . LAPAROSCOPY  08/22/2016   Procedure: LAPAROSCOPY DIAGNOSTIC;  Surgeon: [name], MD;  [facility];  Service: General;;  . MEDIAL PARTIAL KNEE REPLACEMENT Right 2005   "@ [facility]"  . PACEMAKER IMPLANT N/A 08/27/2017   Procedure: PACEMAKER IMPLANT;  Surgeon: Evans Lance, MD;  Location: [place] [facility];  Service: Cardiovascular;  Laterality: N/A;  . ROUX-EN-Y GASTRIC BYPASS  2002   [place]  . SPLENECTOMY  2002  . TONSILLECTOMY  1944  . TUBAL LIGATION    . VAGINAL HYSTERECTOMY      There were no vitals filed for this visit.  Subjective Assessment - 11/03/19 1650    Subjective  COVID 19 screening performed on patient upon arrival. Reports swelling has increased today.    Pertinent History  Pacemaker, chronic diastolic heart failure  Left femoral ORIF, MI, COPD, Fibromyalgia.    Limitations  Walking;House hold activities;Standing    How long can you stand comfortably?  short periods less than 5 minutes    How long can you walk comfortably?  short distances around the home    Diagnostic tests  X-ray: normal, no fractures    Patient Stated Goals  decrease pain, improve movement, improve balance    Currently in Pain?  Yes   did not provide number on pain  scale        [facility] PT Assessment - 11/03/19 0001      Assessment   Medical Diagnosis  Weakness, Chronic pain of left knee    Referring Provider (PT)  Evelina Dun, FNP    Next MD Visit  3 months    Prior Therapy  yes      Precautions   Precautions  Fall;ICD/Pacemaker;Other (comment)                   [facility] Adult PT Treatment/Exercise - 11/03/19 0001      Exercises   Exercises  Knee/Hip;Ankle      Knee/Hip Exercises: Aerobic   Nustep  L5 x16 min UE/LE      Knee/Hip Exercises: Standing   Heel Raises  Both;2 sets;10 reps    Heel Raises Limitations  toe raises 2x10    Rocker Board  4 minutes    Other Standing Knee Exercises  narrow base of support 2x1 min on airex; narrow base of support reaching out of base of support x10 with 1 UE support      Modalities   Modalities   Vasopneumatic      Vasopneumatic   Number Minutes Vasopneumatic   10 minutes    Vasopnuematic Location   Ankle    Vasopneumatic Pressure  Low    Vasopneumatic Temperature   34                  PT Long Term Goals - 10/20/19 1437      PT LONG TERM GOAL #1   Title  Patient will be independent with HEP    Time  6    Period  Weeks    Status  On-going      PT LONG TERM GOAL #2   Title  Patient will demonstrate 115+ degrees of left knee flexion AROM to improve ability to perform functional tasks.    Time  6    Period  Weeks    Status  On-going   AROM 90 degrees 10/20/19     PT LONG TERM GOAL #3   Title  Patient will report ablity to perform ADLs and home activities independently with left knee pain less than or equal to 4/10.    Time  6    Period  Weeks    Status  On-going   able to perform with greater ease yet steps are difficult 10/20/19           Plan - 11/03/19 1638    Clinical Impression Statement  Patient responded fairly well to therapy and was able to tolerate balance activities with minimal complaints. Patient required intermittent UE support with balance activities. Patient discussed swelling can take up to 6 months to a year to fully resolve as she reported increased swelling in left ankle today. Normal vaspneumatic response upon removal.    Personal Factors and Comorbidities  Comorbidity 1    Examination-Activity Limitations  Locomotion Level;Transfers;Stand;Dressing;Hygiene/Grooming    Stability/Clinical Decision Making  Evolving/Moderate complexity    Clinical Decision Making  Low    Rehab Potential  Fair    PT Frequency  2x / week    PT Duration  6 weeks    PT Treatment/Interventions  ADLs/Self Care Home Management;Cryotherapy;Moist Heat;Therapeutic exercise;Therapeutic activities;Patient/family education;Manual techniques;Passive range of motion;Vasopneumatic Device;Balance training;Gait training;Stair training;Functional mobility  training;Neuromuscular re-education    PT Next Visit Plan  Continue Nustep, LE strengthening, vasopneumatic for pain relief and edema    Consulted and  Agree with Plan of Care  Patient       Patient will benefit from skilled therapeutic intervention in order to improve the following deficits and impairments:  Decreased activity tolerance, Pain, Decreased strength, Decreased range of motion, Abnormal gait, Difficulty walking, Increased edema  Visit Diagnosis: Acute pain of left knee  Muscle weakness (generalized)  Acute left ankle pain     Problem List Patient Active Problem List   Diagnosis Date Noted  . Osteoporosis 10/11/2019  . Pacemaker 10/04/2019  . Near syncope   . Hyperlipidemia 06/23/2016  . Colovesical fistula 06/12/2016  . Atrial fibrillation with RVR ([place]) 06/12/2016  . GERD (gastroesophageal reflux disease) 06/12/2016  . Fibromyalgia 06/12/2016  . Peripheral neuropathy 06/12/2016  . Atrial fibrillation ([place]) 06/12/2016  . S/P gastric bypass 01/22/2016  . S/P splenectomy 01/22/2016  . S/P cholecystectomy 01/22/2016  . Hx of small bowel obstruction 01/22/2016  . Asthma 10/19/2015  . Generalized anxiety disorder 10/19/2015  . Depression 10/19/2015  . Insomnia 10/19/2015  . Iron deficiency anemia 10/19/2015  . Vitamin D deficiency 11/29/2014  . Eczema 11/27/2014  . Benign essential HTN 09/25/2014  . Obesity (BMI 30-39.9) 09/25/2014  . Chronic diastolic heart failure ([place]) 09/25/2014  . Allergic rhinitis 02/28/2013    Gabriela Eves, PT, DPT 11/03/2019, 4:51 PM  [facility] [address] [place], [place], [address] Phone: [phone]   Fax:  [phone]  Name: Sabrina [name] MRN: [id number] Date of Birth: 02-21-1946

## 2019-11-07 ENCOUNTER — Other Ambulatory Visit: Payer: Self-pay | Admitting: Family

## 2019-11-08 ENCOUNTER — Other Ambulatory Visit: Payer: Self-pay

## 2019-11-08 ENCOUNTER — Ambulatory Visit: Payer: Medicare HMO | Admitting: Physical Therapy

## 2019-11-08 DIAGNOSIS — M25562 Pain in left knee: Secondary | ICD-10-CM

## 2019-11-08 DIAGNOSIS — M6281 Muscle weakness (generalized): Secondary | ICD-10-CM | POA: Diagnosis not present

## 2019-11-08 DIAGNOSIS — M25662 Stiffness of left knee, not elsewhere classified: Secondary | ICD-10-CM | POA: Diagnosis not present

## 2019-11-08 DIAGNOSIS — M25572 Pain in left ankle and joints of left foot: Secondary | ICD-10-CM

## 2019-11-08 NOTE — Therapy (Signed)
[facility] [place], [place], [address] Phone: [phone]   Fax:  [phone]  Physical Therapy Treatment  Patient Details  Name: Sabrina [name] MRN: [id number] Date of Birth: [birth year] Referring Provider (PT): Evelina Dun, FNP   Encounter Date: 11/08/2019  PT End of Session - 11/08/19 1554    Visit Number  10    Number of Visits  12    Date for PT Re-Evaluation  11/24/19    Authorization Type  KX MODIFIER AFTER 15 VISITS.  PROGRESS NOTE AT 10TH VISIT.    PT Start Time  0315    PT Stop Time  0407    PT Time Calculation (min)  52 min    Activity Tolerance  Patient tolerated treatment well    Behavior During Therapy  WFL for tasks assessed/performed       Past Medical History:  Diagnosis Date  . Anemia   . Ankle fracture, right    past hx. -"no surgery"  . Anxiety   . Asthma   . CHF (congestive heart failure) ([place]) 2009  . Chronic lower back pain   . Collagen vascular disease ([place])   . COPD (chronic obstructive pulmonary disease) ([place])   . Depression   . Fibromyalgia   . GERD (gastroesophageal reflux disease)   . Hyperlipidemia   . Hypertension   . Immature cataract of both eyes   . Myocardial infarction ([place])    "I've had a light one; don't know when it happened" (08/27/2017)  . Osteoarthritis   . Peripheral neuropathy    legs and feet  . Persistent atrial fibrillation ([place])   . Tubular adenoma of colon     Past Surgical History:  Procedure Laterality Date  . APPENDECTOMY    . AV NODE ABLATION N/A 08/27/2017   Procedure: AV NODE ABLATION;  Surgeon: Evans Lance, MD;  Location: [place] [facility];  Service: Cardiovascular;  Laterality: N/A;  . CHOLECYSTECTOMY OPEN  1978  . DILATION AND CURETTAGE OF UTERUS    . FEMUR FRACTURE SURGERY Left 2013   "put 7" rod in it"  . FRACTURE SURGERY    . LAPAROSCOPY  08/22/2016   Procedure: LAPAROSCOPY DIAGNOSTIC;  Surgeon: [name], MD;  [facility];  Service: General;;  . MEDIAL PARTIAL KNEE REPLACEMENT Right 2005   "@ [facility]"  . PACEMAKER IMPLANT N/A 08/27/2017   Procedure: PACEMAKER IMPLANT;  Surgeon: Evans Lance, MD;  Location: [facility] [facility];  Service: Cardiovascular;  Laterality: N/A;  . ROUX-EN-Y GASTRIC BYPASS  2002   [place]  . SPLENECTOMY  2002  . TONSILLECTOMY  1944  . TUBAL LIGATION    . VAGINAL HYSTERECTOMY      There were no vitals filed for this visit.  Subjective Assessment - 11/08/19 1550    Subjective  COVID-19 screen performed prior to patient entering clinic.  Therapy is helping me.    Pertinent History  Pacemaker, chronic diastolic heart failure  Left femoral ORIF, MI, COPD, Fibromyalgia.    Limitations  Walking;House hold activities;Standing    How long can you stand comfortably?  short periods less than 5 minutes    How long can you walk comfortably?  short distances around the home    Diagnostic tests  X-ray: normal, no fractures    Patient Stated Goals  decrease pain, improve movement, improve balance    Currently in Pain?  Yes    Pain Score  3  Pain Location  Knee    Pain Orientation  Left    Pain Descriptors / Indicators  Discomfort    Pain Type  Acute pain    Pain Onset  More than a month ago    Pain Score  4    Pain Location  Ankle    Pain Orientation  Left    Pain Onset  More than a month ago                       [facility] Adult PT Treatment/Exercise - 11/08/19 0001      Exercises   Exercises  Knee/Hip      Knee/Hip Exercises: Aerobic   Nustep  Level 6 x 17 minutes.      Knee/Hip Exercises: Standing   Other Standing Knee Exercises  Rockerboard in parallel bars x 3 minutes.and Airex balanace pad for neuro re-edu x 3 minutes.      Ankle Exercises: Supine   Other Supine Ankle Exercises  Red Theraband left ankle eversion to fatigue.                  PT Long Term Goals - 10/20/19 1437      PT LONG TERM GOAL #1   Title  Patient  will be independent with HEP    Time  6    Period  Weeks    Status  On-going      PT LONG TERM GOAL #2   Title  Patient will demonstrate 115+ degrees of left knee flexion AROM to improve ability to perform functional tasks.    Time  6    Period  Weeks    Status  On-going   AROM 90 degrees 10/20/19     PT LONG TERM GOAL #3   Title  Patient will report ablity to perform ADLs and home activities independently with left knee pain less than or equal to 4/10.    Time  6    Period  Weeks    Status  On-going   able to perform with greater ease yet steps are difficult 10/20/19             Patient will benefit from skilled therapeutic intervention in order to improve the following deficits and impairments:     Visit Diagnosis: Acute pain of left knee  Muscle weakness (generalized)  Acute left ankle pain  Stiffness of left knee, not elsewhere classified     Problem List Patient Active Problem List   Diagnosis Date Noted  . Osteoporosis 10/11/2019  . Pacemaker 10/04/2019  . Near syncope   . Hyperlipidemia 06/23/2016  . Colovesical fistula 06/12/2016  . Atrial fibrillation with RVR ([place]) 06/12/2016  . GERD (gastroesophageal reflux disease) 06/12/2016  . Fibromyalgia 06/12/2016  . Peripheral neuropathy 06/12/2016  . Atrial fibrillation ([place]) 06/12/2016  . S/P gastric bypass 01/22/2016  . S/P splenectomy 01/22/2016  . S/P cholecystectomy 01/22/2016  . Hx of small bowel obstruction 01/22/2016  . Asthma 10/19/2015  . Generalized anxiety disorder 10/19/2015  . Depression 10/19/2015  . Insomnia 10/19/2015  . Iron deficiency anemia 10/19/2015  . Vitamin D deficiency 11/29/2014  . Eczema 11/27/2014  . Benign essential HTN 09/25/2014  . Obesity (BMI 30-39.9) 09/25/2014  . Chronic diastolic heart failure ([place]) 09/25/2014  . Allergic rhinitis 02/28/2013    Kacen Mellinger, [place] MPT 11/08/2019, 4:09 PM  [facility] [address] [place], [place], [address] Phone: [phone]   Fax:  [phone]  Name: Sabrina [name] MRN: [id number] Date of Birth: 1946-08-16

## 2019-11-10 ENCOUNTER — Encounter: Payer: Medicare HMO | Admitting: Physical Therapy

## 2019-11-14 ENCOUNTER — Encounter: Payer: Self-pay | Admitting: Physical Therapy

## 2019-11-14 ENCOUNTER — Ambulatory Visit: Payer: Medicare HMO | Admitting: Physical Therapy

## 2019-11-14 ENCOUNTER — Other Ambulatory Visit: Payer: Self-pay

## 2019-11-14 DIAGNOSIS — M25562 Pain in left knee: Secondary | ICD-10-CM

## 2019-11-14 DIAGNOSIS — M6281 Muscle weakness (generalized): Secondary | ICD-10-CM | POA: Diagnosis not present

## 2019-11-14 DIAGNOSIS — M25662 Stiffness of left knee, not elsewhere classified: Secondary | ICD-10-CM

## 2019-11-14 DIAGNOSIS — M25572 Pain in left ankle and joints of left foot: Secondary | ICD-10-CM | POA: Diagnosis not present

## 2019-11-14 NOTE — Therapy (Signed)
[facility] [place], [place], [address] Phone: [phone]   Fax:  [phone]  Physical Therapy Treatment  Patient Details  Name: Sabrina [name] MRN: [id number] Date of Birth: 1946/04/07 Referring Provider (PT): Evelina Dun, FNP   Encounter Date: 11/14/2019  PT End of Session - 11/14/19 1614    Visit Number  11    Number of Visits  12    Date for PT Re-Evaluation  11/24/19    Authorization Type  KX MODIFIER AFTER 15 VISITS.  PROGRESS NOTE AT 10TH VISIT.    PT Start Time  1402    PT Stop Time  1509    PT Time Calculation (min)  67 min    Equipment Utilized During Treatment  Other (comment)   SPC   Activity Tolerance  Patient tolerated treatment well    Behavior During Therapy  WFL for tasks assessed/performed       Past Medical History:  Diagnosis Date  . Anemia   . Ankle fracture, right    past hx. -"no surgery"  . Anxiety   . Asthma   . CHF (congestive heart failure) ([place]) 2009  . Chronic lower back pain   . Collagen vascular disease ([place])   . COPD (chronic obstructive pulmonary disease) ([place])   . Depression   . Fibromyalgia   . GERD (gastroesophageal reflux disease)   . Hyperlipidemia   . Hypertension   . Immature cataract of both eyes   . Myocardial infarction ([place])    "I've had a light one; don't know when it happened" (08/27/2017)  . Osteoarthritis   . Peripheral neuropathy    legs and feet  . Persistent atrial fibrillation ([place])   . Tubular adenoma of colon     Past Surgical History:  Procedure Laterality Date  . APPENDECTOMY    . AV NODE ABLATION N/A 08/27/2017   Procedure: AV NODE ABLATION;  Surgeon: Evans Lance, MD;  Location: [place] [facility];  Service: Cardiovascular;  Laterality: N/A;  . CHOLECYSTECTOMY OPEN  1978  . DILATION AND CURETTAGE OF UTERUS    . FEMUR FRACTURE SURGERY Left 2013   "put 7" rod in it"  . FRACTURE SURGERY    . LAPAROSCOPY  08/22/2016   Procedure:  LAPAROSCOPY DIAGNOSTIC;  Surgeon: [name], MD;  [facility];  Service: General;;  . MEDIAL PARTIAL KNEE REPLACEMENT Right 2005   "@ [facility]"  . PACEMAKER IMPLANT N/A 08/27/2017   Procedure: PACEMAKER IMPLANT;  Surgeon: Evans Lance, MD;  Location: [place] [facility];  Service: Cardiovascular;  Laterality: N/A;  . ROUX-EN-Y GASTRIC BYPASS  2002   [place]  . SPLENECTOMY  2002  . TONSILLECTOMY  1944  . TUBAL LIGATION    . VAGINAL HYSTERECTOMY      There were no vitals filed for this visit.  Subjective Assessment - 11/14/19 1415    Subjective  COVID-19 screen performed prior to patient entering clinic.  Reports more stiffness than anything.    Pertinent History  Pacemaker, chronic diastolic heart failure  Left femoral ORIF, MI, COPD, Fibromyalgia.    Limitations  Walking;House hold activities;Standing    How long can you stand comfortably?  short periods less than 5 minutes    How long can you walk comfortably?  short distances around the home    Diagnostic tests  X-ray: normal, no fractures    Patient Stated Goals  decrease pain, improve movement, improve balance  Currently in Pain?  Yes    Pain Score  5     Pain Location  Ankle    Pain Orientation  Left    Pain Descriptors / Indicators  Discomfort    Pain Type  Acute pain    Pain Onset  More than a month ago    Pain Frequency  Constant         [facility] PT Assessment - 11/14/19 0001      Assessment   Medical Diagnosis  Weakness, Chronic pain of left knee    Referring Provider (PT)  Evelina Dun, FNP    Next MD Visit  3 months    Prior Therapy  yes      Precautions   Precautions  Fall;ICD/Pacemaker;Other (comment)                   [facility] Adult PT Treatment/Exercise - 11/14/19 0001      Knee/Hip Exercises: Aerobic   Nustep  L5 x22 min (1.2 mi)    per patient request     Knee/Hip Exercises: Standing   Lateral Step Up  Left;2 sets;10 reps;Hand Hold: 2;Step Height: 6"    Forward  Step Up  Left;2 sets;10 reps;Hand Hold: 2;Step Height: 6"      Knee/Hip Exercises: Seated   Long Arc Quad  Strengthening;Left;10 reps;Weights;3 sets    [employer] Weight  4 lbs.    Clamshell with TheraBand  Red   x20 reps     Knee/Hip Exercises: Supine   Straight Leg Raises  Strengthening;Left;10 reps;2 sets      Modalities   Modalities  Vasopneumatic      Vasopneumatic   Number Minutes Vasopneumatic   15 minutes    Vasopnuematic Location   Ankle    Vasopneumatic Pressure  Low    Vasopneumatic Temperature   34      Ankle Exercises: Standing   Rocker Board  3 minutes    Heel Raises  Both;20 reps    Toe Raise  20 reps      Ankle Exercises: Seated   ABC's  1 rep   1# ankle isolator                 PT Long Term Goals - 10/20/19 1437      PT LONG TERM GOAL #1   Title  Patient will be independent with HEP    Time  6    Period  Weeks    Status  On-going      PT LONG TERM GOAL #2   Title  Patient will demonstrate 115+ degrees of left knee flexion AROM to improve ability to perform functional tasks.    Time  6    Period  Weeks    Status  On-going   AROM 90 degrees 10/20/19     PT LONG TERM GOAL #3   Title  Patient will report ablity to perform ADLs and home activities independently with left knee pain less than or equal to 4/10.    Time  6    Period  Weeks    Status  On-going   able to perform with greater ease yet steps are difficult 10/20/19           Plan - 11/14/19 1619    Clinical Impression Statement  Patient presented in clinic with only reports of stiffness in L knee and ankle. Patient using [facility] for ambulation. Patient able to complete all therex well although with lateral step  ups discomfort reported in L knee. Difficulty also reported with ankle circles due to limitation of range. Normal modalities response noted following removal of the modalities.    Personal Factors and Comorbidities  Comorbidity 1    Examination-Activity Limitations   Locomotion Level;Transfers;Stand;Dressing;Hygiene/Grooming    Stability/Clinical Decision Making  Evolving/Moderate complexity    Rehab Potential  Fair    PT Frequency  2x / week    PT Duration  6 weeks    PT Treatment/Interventions  ADLs/Self Care Home Management;Cryotherapy;Moist Heat;Therapeutic exercise;Therapeutic activities;Patient/family education;Manual techniques;Passive range of motion;Vasopneumatic Device;Balance training;Gait training;Stair training;Functional mobility training;Neuromuscular re-education    PT Next Visit Plan  Continue Nustep, LE strengthening, vasopneumatic for pain relief and edema    Consulted and Agree with Plan of Care  Patient       Patient will benefit from skilled therapeutic intervention in order to improve the following deficits and impairments:  Decreased activity tolerance, Pain, Decreased strength, Decreased range of motion, Abnormal gait, Difficulty walking, Increased edema  Visit Diagnosis: Acute pain of left knee  Muscle weakness (generalized)  Acute left ankle pain  Stiffness of left knee, not elsewhere classified     Problem List Patient Active Problem List   Diagnosis Date Noted  . Osteoporosis 10/11/2019  . Pacemaker 10/04/2019  . Near syncope   . Hyperlipidemia 06/23/2016  . Colovesical fistula 06/12/2016  . Atrial fibrillation with RVR ([place]) 06/12/2016  . GERD (gastroesophageal reflux disease) 06/12/2016  . Fibromyalgia 06/12/2016  . Peripheral neuropathy 06/12/2016  . Atrial fibrillation ([place]) 06/12/2016  . S/P gastric bypass 01/22/2016  . S/P splenectomy 01/22/2016  . S/P cholecystectomy 01/22/2016  . Hx of small bowel obstruction 01/22/2016  . Asthma 10/19/2015  . Generalized anxiety disorder 10/19/2015  . Depression 10/19/2015  . Insomnia 10/19/2015  . Iron deficiency anemia 10/19/2015  . Vitamin D deficiency 11/29/2014  . Eczema 11/27/2014  . Benign essential HTN 09/25/2014  . Obesity (BMI 30-39.9) 09/25/2014   . Chronic diastolic heart failure ([place]) 09/25/2014  . Allergic rhinitis 02/28/2013    Standley Brooking, PTA 11/14/2019, 4:21 PM  [facility] [place], [place], [address] Phone: [phone]   Fax:  [phone]  Name: Sabrina [name] MRN: [id number] Date of Birth: [birth year]

## 2019-11-17 ENCOUNTER — Other Ambulatory Visit: Payer: Self-pay

## 2019-11-17 ENCOUNTER — Ambulatory Visit: Payer: Medicare HMO | Admitting: Physical Therapy

## 2019-11-17 DIAGNOSIS — M25662 Stiffness of left knee, not elsewhere classified: Secondary | ICD-10-CM

## 2019-11-17 DIAGNOSIS — M6281 Muscle weakness (generalized): Secondary | ICD-10-CM | POA: Diagnosis not present

## 2019-11-17 DIAGNOSIS — M25572 Pain in left ankle and joints of left foot: Secondary | ICD-10-CM | POA: Diagnosis not present

## 2019-11-17 DIAGNOSIS — J449 Chronic obstructive pulmonary disease, unspecified: Secondary | ICD-10-CM | POA: Diagnosis not present

## 2019-11-17 DIAGNOSIS — M25562 Pain in left knee: Secondary | ICD-10-CM

## 2019-11-17 NOTE — Therapy (Signed)
[facility] [place], [place], [address] Phone: [phone]   Fax:  [phone]  Physical Therapy Treatment PHYSICAL THERAPY DISCHARGE SUMMARY  Visits from Start of Care: 12  Current functional level related to goals / functional outcomes: See below   Remaining deficits: See goals  Education / Equipment: HEP Plan: Patient agrees to discharge.  Patient goals were partially met. Patient is being discharged due to meeting the stated rehab goals.  ?????     Patient Details  Name: Sabrina [name] MRN: [id number] Date of Birth: 12/26/1945 Referring Provider (PT): Evelina Dun, FNP   Encounter Date: 11/17/2019  PT End of Session - 11/17/19 1459    Visit Number  12    Number of Visits  12    Date for PT Re-Evaluation  11/24/19    Authorization Type  KX MODIFIER AFTER 15 VISITS.  PROGRESS NOTE AT 10TH VISIT.    PT Start Time  1430    PT Stop Time  1520    PT Time Calculation (min)  50 min    Activity Tolerance  Patient tolerated treatment well    Behavior During Therapy  WFL for tasks assessed/performed       Past Medical History:  Diagnosis Date  . Anemia   . Ankle fracture, right    past hx. -"no surgery"  . Anxiety   . Asthma   . CHF (congestive heart failure) ([place]) 2009  . Chronic lower back pain   . Collagen vascular disease ([place])   . COPD (chronic obstructive pulmonary disease) ([place])   . Depression   . Fibromyalgia   . GERD (gastroesophageal reflux disease)   . Hyperlipidemia   . Hypertension   . Immature cataract of both eyes   . Myocardial infarction ([place])    "I've had a light one; don't know when it happened" (08/27/2017)  . Osteoarthritis   . Peripheral neuropathy    legs and feet  . Persistent atrial fibrillation ([place])   . Tubular adenoma of colon     Past Surgical History:  Procedure Laterality Date  . APPENDECTOMY    . AV NODE ABLATION N/A 08/27/2017   Procedure: AV NODE ABLATION;   Surgeon: Evans Lance, MD;  Location: [place] [facility];  Service: Cardiovascular;  Laterality: N/A;  . CHOLECYSTECTOMY OPEN  1978  . DILATION AND CURETTAGE OF UTERUS    . FEMUR FRACTURE SURGERY Left 2013   "put 7" rod in it"  . FRACTURE SURGERY    . LAPAROSCOPY  08/22/2016   Procedure: LAPAROSCOPY DIAGNOSTIC;  Surgeon: [name], MD;  Location: [facility];  Service: General;;  . MEDIAL PARTIAL KNEE REPLACEMENT Right 2005   "@ [facility]"  . PACEMAKER IMPLANT N/A 08/27/2017   Procedure: PACEMAKER IMPLANT;  Surgeon: Evans Lance, MD;  Location: [place] [facility];  Service: Cardiovascular;  Laterality: N/A;  . ROUX-EN-Y GASTRIC BYPASS  2002   [place]  . SPLENECTOMY  2002  . TONSILLECTOMY  1944  . TUBAL LIGATION    . VAGINAL HYSTERECTOMY      There were no vitals filed for this visit.  Subjective Assessment - 11/17/19 1441    Subjective  COVID-19 screen performed prior to patient entering clinic. Patient reports feeling good.    Pertinent History  Pacemaker, chronic diastolic heart failure  Left femoral ORIF, MI, COPD, Fibromyalgia.    Limitations  Walking;House hold activities;Standing    How long can you stand comfortably?  short periods less than 5 minutes    How long can you walk comfortably?  short distances around the home    Diagnostic tests  X-ray: normal, no fractures    Patient Stated Goals  decrease pain, improve movement, improve balance         [facility] PT Assessment - 11/17/19 0001      Assessment   Medical Diagnosis  Weakness, Chronic pain of left knee    Referring Provider (PT)  Evelina Dun, FNP    Next MD Visit  3 months    Prior Therapy  yes      Precautions   Precautions  Fall;ICD/Pacemaker;Other (comment)                   [place] Adult PT Treatment/Exercise - 11/17/19 0001      Exercises   Exercises  Knee/Hip      Knee/Hip Exercises: Aerobic   Nustep  L5 x21 min (1526 steps)   per patient request     Knee/Hip  Exercises: Standing   Lateral Step Up  Left;2 sets;10 reps;Hand Hold: 2;Step Height: 6"    Forward Step Up  Left;2 sets;10 reps;Hand Hold: 2;Step Height: 6"    Other Standing Knee Exercises  lateral stepping in parallel bars x3 mins      Knee/Hip Exercises: Seated   Sit to Sand  2 sets;10 reps      Modalities   Modalities  Vasopneumatic      Vasopneumatic   Number Minutes Vasopneumatic   15 minutes    Vasopnuematic Location   Ankle    Vasopneumatic Pressure  Low    Vasopneumatic Temperature   34      Ankle Exercises: Standing   Rocker Board  3 minutes                  PT Long Term Goals - 11/17/19 1507      PT LONG TERM GOAL #1   Title  Patient will be independent with HEP    Time  6    Period  Weeks    Status  Achieved      PT LONG TERM GOAL #2   Title  Patient will demonstrate 115+ degrees of left knee flexion AROM to improve ability to perform functional tasks.    Time  6    Period  Weeks    Status  Achieved   115 degrees AROM flexion     PT LONG TERM GOAL #3   Title  Patient will report ablity to perform ADLs and home activities independently with left knee pain less than or equal to 4/10.    Time  6    Period  Weeks    Status  Partially Met            Plan - 11/17/19 1458    Clinical Impression Statement  Patient responded well to therapy session with no reports of increased discomfort or pain in the left knee or left ankle. Patient was able to complete TEs with only verbal cuing for upright posture. Patient and PT discussed continuing HEP to maintain gains made in physical therapy. Patient's goal is to maintain exercise at the [facility] once she is fully vaccinated for COVID-19. Patient to be discharged today with goals partially met.    Personal Factors and Comorbidities  Comorbidity 1    Examination-Activity Limitations  Locomotion Level;Transfers;Stand;Dressing;Hygiene/Grooming    Stability/Clinical Decision Making  Evolving/Moderate complexity     Clinical Decision Making  Low    Rehab Potential  Fair    PT Frequency  2x / week    PT Duration  6 weeks    PT Treatment/Interventions  ADLs/Self Care Home Management;Cryotherapy;Moist Heat;Therapeutic exercise;Therapeutic activities;Patient/family education;Manual techniques;Passive range of motion;Vasopneumatic Device;Balance training;Gait training;Stair training;Functional mobility training;Neuromuscular re-education    PT Next Visit Plan  DC    Consulted and Agree with Plan of Care  Patient       Patient will benefit from skilled therapeutic intervention in order to improve the following deficits and impairments:  Decreased activity tolerance, Pain, Decreased strength, Decreased range of motion, Abnormal gait, Difficulty walking, Increased edema  Visit Diagnosis: Acute pain of left knee  Muscle weakness (generalized)  Acute left ankle pain  Stiffness of left knee, not elsewhere classified     Problem List Patient Active Problem List   Diagnosis Date Noted  . Osteoporosis 10/11/2019  . Pacemaker 10/04/2019  . Near syncope   . Hyperlipidemia 06/23/2016  . Colovesical fistula 06/12/2016  . Atrial fibrillation with RVR ([place]) 06/12/2016  . GERD (gastroesophageal reflux disease) 06/12/2016  . Fibromyalgia 06/12/2016  . Peripheral neuropathy 06/12/2016  . Atrial fibrillation ([place]) 06/12/2016  . S/P gastric bypass 01/22/2016  . S/P splenectomy 01/22/2016  . S/P cholecystectomy 01/22/2016  . Hx of small bowel obstruction 01/22/2016  . Asthma 10/19/2015  . Generalized anxiety disorder 10/19/2015  . Depression 10/19/2015  . Insomnia 10/19/2015  . Iron deficiency anemia 10/19/2015  . Vitamin D deficiency 11/29/2014  . Eczema 11/27/2014  . Benign essential HTN 09/25/2014  . Obesity (BMI 30-39.9) 09/25/2014  . Chronic diastolic heart failure ([place]) 09/25/2014  . Allergic rhinitis 02/28/2013    Gabriela Eves, PT, DPT 11/17/2019, 5:49 PM  [facility] [address] [place], [place], [address] Phone: [phone]   Fax:  [phone]  Name: YVETTE LOVELESS MRN: [id number] Date of Birth: [birth year]

## 2019-11-23 ENCOUNTER — Other Ambulatory Visit: Payer: Self-pay | Admitting: Family

## 2019-11-23 DIAGNOSIS — F411 Generalized anxiety disorder: Secondary | ICD-10-CM

## 2019-11-23 DIAGNOSIS — M26609 Unspecified temporomandibular joint disorder, unspecified side: Secondary | ICD-10-CM

## 2019-12-16 ENCOUNTER — Encounter: Payer: Self-pay | Admitting: Cardiovascular Disease

## 2019-12-16 ENCOUNTER — Telehealth: Payer: Self-pay | Admitting: Cardiovascular Disease

## 2019-12-16 ENCOUNTER — Telehealth (INDEPENDENT_AMBULATORY_CARE_PROVIDER_SITE_OTHER): Payer: Medicare HMO | Admitting: Cardiovascular Disease

## 2019-12-16 VITALS — BP 122/72 | HR 80 | Ht 64.0 in | Wt 178.0 lb

## 2019-12-16 DIAGNOSIS — M79672 Pain in left foot: Secondary | ICD-10-CM | POA: Diagnosis not present

## 2019-12-16 DIAGNOSIS — M79605 Pain in left leg: Secondary | ICD-10-CM

## 2019-12-16 DIAGNOSIS — I4821 Permanent atrial fibrillation: Secondary | ICD-10-CM

## 2019-12-16 DIAGNOSIS — Z950 Presence of cardiac pacemaker: Secondary | ICD-10-CM

## 2019-12-16 DIAGNOSIS — M79671 Pain in right foot: Secondary | ICD-10-CM | POA: Diagnosis not present

## 2019-12-16 DIAGNOSIS — I10 Essential (primary) hypertension: Secondary | ICD-10-CM

## 2019-12-16 DIAGNOSIS — M79604 Pain in right leg: Secondary | ICD-10-CM

## 2019-12-16 DIAGNOSIS — I5032 Chronic diastolic (congestive) heart failure: Secondary | ICD-10-CM

## 2019-12-16 NOTE — Progress Notes (Signed)
Virtual Visit via Telephone Note   This visit type was conducted due to national recommendations for restrictions regarding the COVID-19 Pandemic (e.g. social distancing) in an effort to limit this patient's exposure and mitigate transmission in our community.  Due to her co-morbid illnesses, this patient is at least at moderate risk for complications without adequate follow up.  This format is felt to be most appropriate for this patient at this time.  The patient did not have access to video technology/had technical difficulties with video requiring transitioning to audio format only (telephone).  All issues noted in this document were discussed and addressed.  No physical exam could be performed with this format.  Please refer to the patient's chart for her  consent to telehealth for [facility].   The patient was identified using 2 identifiers.  Date:  12/16/2019   ID:  Sabrina [name], Sabrina [name] 1945-12-04, MRN [id number]  Patient Location: Home Provider Location: Home  PCP:  Sharion Balloon, FNP  Cardiologist:  Kate Sable, MD  Electrophysiologist:  None   Evaluation Performed:  Follow-Up Visit  Chief Complaint:  Persistent atrial fibrillation  History of Present Illness:    [name] is a 74 y.o. female with a history of persistent atrial fibrillation and underwent AV node ablation and pacemaker implantation. She is on Eliquis for anticoagulation.  She enjoys doing yoga but is waiting until her second dose of the COVID vaccine.  She lives with her grandson who is in his 83s whom she adopted several years ago.  She's had some left ankle swelling where she fractured it. She's getting PT.  She's been going to the [facility] and using the recumbent bicycle.  She's been having issues with her feet. She says they're blue. She has pain in her legs when she walks. She also has bilateral swelling.  She uses compression stockings.  She smoked for 20 yrs and quit 30  years ago.  She is going through a divorce. Her grandson works at [employer]. She's been married 7 times. She is a retired Quarry manager.   Past Medical History:  Diagnosis Date  . Anemia   . Ankle fracture, right    past hx. -"no surgery"  . Anxiety   . Asthma   . CHF (congestive heart failure) ([place]) 2009  . Chronic lower back pain   . Collagen vascular disease ([place])   . COPD (chronic obstructive pulmonary disease) ([place])   . Depression   . Fibromyalgia   . GERD (gastroesophageal reflux disease)   . Hyperlipidemia   . Hypertension   . Immature cataract of both eyes   . Myocardial infarction ([place])    "I've had a light one; don't know when it happened" (08/27/2017)  . Osteoarthritis   . Peripheral neuropathy    legs and feet  . Persistent atrial fibrillation ([place])   . Tubular adenoma of colon    Past Surgical History:  Procedure Laterality Date  . APPENDECTOMY    . AV NODE ABLATION N/A 08/27/2017   Procedure: AV NODE ABLATION;  Surgeon: Evans Lance, MD;  Location: [facility];  Service: Cardiovascular;  Laterality: N/A;  . CHOLECYSTECTOMY OPEN  1978  . DILATION AND CURETTAGE OF UTERUS    . FEMUR FRACTURE SURGERY Left 2013   "put 7" rod in it"  . FRACTURE SURGERY    . LAPAROSCOPY  08/22/2016   Procedure: LAPAROSCOPY DIAGNOSTIC;  Surgeon: [name], MD;  Location: [facility];  Service: General;;  .  MEDIAL PARTIAL KNEE REPLACEMENT Right 2005   "@ [facility]"  . PACEMAKER IMPLANT N/A 08/27/2017   Procedure: PACEMAKER IMPLANT;  Surgeon: Evans Lance, MD;  Location: [facility];  Service: Cardiovascular;  Laterality: N/A;  . ROUX-EN-Y GASTRIC BYPASS  2002   [place]  . SPLENECTOMY  2002  . TONSILLECTOMY  1944  . TUBAL LIGATION    . VAGINAL HYSTERECTOMY       Current Meds  Medication Sig  . alendronate (FOSAMAX) 70 MG tablet Take 1 tablet (70 mg total) by mouth every 7 (seven) days. Take with a full glass of water on an empty stomach.  Marland Kitchen amitriptyline  (ELAVIL) 10 MG tablet Take 4 tablets every night  . atorvastatin (LIPITOR) 20 MG tablet Take 1 tablet (20 mg total) by mouth daily.  . calcium carbonate (OSCAL) 1500 (600 Ca) MG TABS tablet Take by mouth daily with breakfast.  . DULoxetine (CYMBALTA) 60 MG capsule TAKE 1 CAPSULE BY MOUTH EVERY DAY  . ELIQUIS 5 MG TABS tablet TAKE 1 TABLET BY MOUTH TWICE A DAY  . fluticasone (FLONASE) 50 MCG/ACT nasal spray SPRAY 2 SPRAYS INTO EACH NOSTRIL EVERY DAY  . furosemide (LASIX) 40 MG tablet TAKE 1 TABLET BY MOUTH EVERY DAY  . gabapentin (NEURONTIN) 100 MG capsule TAKE 1 CAPSULE (100 MG TOTAL) BY MOUTH 2 (TWO) TIMES DAILY. NEEDS TO BE SEEN  . Multiple Vitamins-Minerals (ONE-A-DAY 50 PLUS PO) Take 1 tablet by mouth 2 (two) times daily.  . mupirocin ointment (BACTROBAN) 2 % APPLY TO AFFECTED AREA TWICE A DAY  . Omega-3 Fatty Acids (FISH OIL PO) Take 1 capsule by mouth daily.  . potassium chloride SA (K-DUR,KLOR-CON) 20 MEQ tablet Take 1 tablet (20 mEq total) by mouth daily.  Marland Kitchen PRESCRIPTION MEDICATION Apply 1 application topically daily as needed (excema). HAL Ointment  . triamcinolone ointment (KENALOG) 0.5 % Apply 1 application topically 2 (two) times daily.  . VENTOLIN HFA 108 (90 Base) MCG/ACT inhaler TAKE 2 PUFFS BY MOUTH EVERY 6 HOURS AS NEEDED FOR WHEEZE OR SHORTNESS OF BREATH  . Vitamin D, Ergocalciferol, (DRISDOL) 1.25 MG (50000 UNIT) CAPS capsule Take 1 capsule (50,000 Units total) by mouth every 7 (seven) days.     Allergies:   Codeine   Social History   Tobacco Use  . Smoking status: Former Smoker    Packs/day: 0.50    Years: 25.00    Pack years: 12.50    Types: Cigarettes    Quit date: 02/26/1993    Years since quitting: 26.8  . Smokeless tobacco: Never Used  Substance Use Topics  . Alcohol use: Not Currently    Comment: 08/27/2017 "nothing since early 2000s"  . Drug use: Not Currently     Family Hx: The patient's family history includes Alzheimer's disease in her mother;  Arthritis in her daughter, father, son, and son; Asthma in her daughter; Cancer in her mother; Colon cancer (age of onset: 4) in her paternal uncle; Heart disease in her mother; Hyperlipidemia in her son; Obesity in her daughter, son, and son.  ROS:   Please see the history of present illness.     All other systems reviewed and are negative.   Prior CV studies:   The following studies were reviewed today:  NA  Labs/Other Tests and Data Reviewed:    EKG:  No ECG reviewed.  Recent Labs: 09/30/2019: ALT 8; BUN 20; Creatinine, Ser 0.88; Hemoglobin 13.7; Platelets 349; Potassium 3.7; Sodium 141  Recent Lipid Panel Lab Results  Component Value Date/Time   CHOL 199 09/30/2019 10:18 AM   TRIG 137 09/30/2019 10:18 AM   HDL 60 09/30/2019 10:18 AM   CHOLHDL 3.3 09/30/2019 10:18 AM   LDLCALC 115 (H) 09/30/2019 10:18 AM    Wt Readings from Last 3 Encounters:  12/16/19 178 lb (80.7 kg)  10/04/19 184 lb (83.5 kg)  09/30/19 182 lb 6.4 oz (82.7 kg)     Objective:    Vital Signs:  BP 122/72   Pulse 80   Ht 5\' 4"  (1.626 m)   Wt 178 lb (80.7 kg)   BMI 30.55 kg/m    VITAL SIGNS:  reviewed  ASSESSMENT & PLAN:    1. Permanent atrial fibrillation status post AV node ablation and pacemaker placement: Symptomatically stable. Dr. Lovena Le stopped metoprolol in January 2020 due to complaints of dizziness. Anticoagulated with Eliquis 5 mg twice daily.  Device function is normal.  2. Chronic diastolic heart failure: Symptomatically stable. Continue Lasix 40 mg daily and supplemental potassium.  3. Hypertension: Controlled on present therapy. No changes.  4. Bilateral leg and feet pain and swelling: She may have venous insufficiency. She is wearing compression stockings. I explained their proper use. She smoked cigarettes for 20 yrs but quit 30 yrs ago. I will obtain ABI's.     COVID-19 Education: The signs and symptoms of COVID-19 were discussed with the patient and how to seek  care for testing (follow up with PCP or arrange E-visit).  The importance of social distancing was discussed today.  Time:   Today, I have spent 25 minutes with the patient with telehealth technology discussing the above problems.     Medication Adjustments/Labs and Tests Ordered: Current medicines are reviewed at length with the patient today.  Concerns regarding medicines are outlined above.   Tests Ordered: No orders of the defined types were placed in this encounter.   Medication Changes: No orders of the defined types were placed in this encounter.   Follow Up:  In Person in 3 month(s)  Signed, Kate Sable, MD  12/16/2019 11:19 AM    [place]

## 2019-12-16 NOTE — Patient Instructions (Signed)
Medication Instructions:  Continue all current medications.  Labwork: none  Testing/Procedures:  Your physician has requested that you have an ankle brachial index (ABI). During this test an ultrasound and blood pressure cuff are used to evaluate the arteries that supply the arms and legs with blood. Allow thirty minutes for this exam. There are no restrictions or special instructions.  Office will contact with results via phone or letter.    Follow-Up: 3 months   Any Other Special Instructions Will Be Listed Below (If Applicable).  If you need a refill on your cardiac medications before your next appointment, please call your pharmacy.

## 2019-12-16 NOTE — Addendum Note (Signed)
Addended by: Laurine Blazer on: 12/16/2019 12:18 PM   Modules accepted: Orders

## 2019-12-16 NOTE — Telephone Encounter (Signed)
  Precert needed for: ABI bilat leg and feet pain   Location: [facility]    Date: January 12, 2020

## 2019-12-19 ENCOUNTER — Other Ambulatory Visit: Payer: Self-pay

## 2019-12-19 MED ORDER — AMITRIPTYLINE HCL 10 MG PO TABS: ORAL_TABLET | ORAL | 2 refills | Status: DC

## 2019-12-20 ENCOUNTER — Other Ambulatory Visit: Payer: Self-pay | Admitting: Pharmacist

## 2019-12-20 MED ORDER — APIXABAN 5 MG PO TABS: 5.0000 mg | ORAL_TABLET | Freq: Two times a day (BID) | ORAL | 1 refills | Status: DC

## 2019-12-20 NOTE — Progress Notes (Signed)
Age 74, weight 81kg, SCr 0.88 on 09/30/19, afib indication, last OV March 2021

## 2019-12-21 ENCOUNTER — Other Ambulatory Visit: Payer: Self-pay

## 2019-12-21 ENCOUNTER — Other Ambulatory Visit: Payer: Self-pay | Admitting: Cardiovascular Disease

## 2019-12-21 ENCOUNTER — Other Ambulatory Visit: Payer: Self-pay | Admitting: *Deleted

## 2019-12-21 DIAGNOSIS — F411 Generalized anxiety disorder: Secondary | ICD-10-CM

## 2019-12-21 DIAGNOSIS — I739 Peripheral vascular disease, unspecified: Secondary | ICD-10-CM

## 2019-12-21 MED ORDER — FUROSEMIDE 40 MG PO TABS: 40.0000 mg | ORAL_TABLET | Freq: Every day | ORAL | 3 refills | Status: DC

## 2019-12-21 MED ORDER — DULOXETINE HCL 60 MG PO CPEP: ORAL_CAPSULE | ORAL | 0 refills | Status: DC

## 2019-12-21 MED ORDER — ALENDRONATE SODIUM 70 MG PO TABS: 70.0000 mg | ORAL_TABLET | ORAL | 0 refills | Status: DC

## 2019-12-21 MED ORDER — ALBUTEROL SULFATE HFA 108 (90 BASE) MCG/ACT IN AERS: INHALATION_SPRAY | RESPIRATORY_TRACT | 0 refills | Status: DC

## 2019-12-27 ENCOUNTER — Other Ambulatory Visit: Payer: Self-pay

## 2019-12-27 ENCOUNTER — Other Ambulatory Visit: Payer: Self-pay | Admitting: *Deleted

## 2019-12-27 DIAGNOSIS — M26609 Unspecified temporomandibular joint disorder, unspecified side: Secondary | ICD-10-CM

## 2019-12-27 MED ORDER — AMITRIPTYLINE HCL 10 MG PO TABS: ORAL_TABLET | ORAL | 0 refills | Status: DC

## 2019-12-27 MED ORDER — GABAPENTIN 100 MG PO CAPS: 100.0000 mg | ORAL_CAPSULE | Freq: Two times a day (BID) | ORAL | 0 refills | Status: DC

## 2019-12-30 ENCOUNTER — Ambulatory Visit (INDEPENDENT_AMBULATORY_CARE_PROVIDER_SITE_OTHER): Payer: Medicare HMO | Admitting: *Deleted

## 2019-12-30 DIAGNOSIS — Z950 Presence of cardiac pacemaker: Secondary | ICD-10-CM | POA: Diagnosis not present

## 2019-12-30 LAB — CUP PACEART REMOTE DEVICE CHECK
Battery Remaining Longevity: 99 mo
Battery Voltage: 2.91 V
Brady Statistic AP VP Percent: 0.26 %
Brady Statistic AP VS Percent: 99.69 %
Brady Statistic AS VP Percent: 0 %
Brady Statistic AS VS Percent: 0.05 %
Brady Statistic RA Percent Paced: 99.99 %
Brady Statistic RV Percent Paced: 0.26 %
Date Time Interrogation Session: 20210408154910
Implantable Lead Implant Date: 20181206
Implantable Lead Implant Date: 20181206
Implantable Lead Location: 753860
Implantable Lead Location: 753860
Implantable Lead Model: 3830
Implantable Lead Model: 5076
Implantable Pulse Generator Implant Date: 20181206
Lead Channel Impedance Value: 285 Ohm
Lead Channel Impedance Value: 342 Ohm
Lead Channel Impedance Value: 399 Ohm
Lead Channel Impedance Value: 494 Ohm
Lead Channel Sensing Intrinsic Amplitude: 4.75 mV
Lead Channel Sensing Intrinsic Amplitude: 4.75 mV
Lead Channel Sensing Intrinsic Amplitude: 5.375 mV
Lead Channel Sensing Intrinsic Amplitude: 9.75 mV
Lead Channel Setting Pacing Amplitude: 2 V
Lead Channel Setting Pacing Amplitude: 2.5 V
Lead Channel Setting Pacing Pulse Width: 0.3 ms
Lead Channel Setting Sensing Sensitivity: 2 mV

## 2019-12-30 NOTE — Progress Notes (Signed)
PPM Remote  

## 2020-01-02 ENCOUNTER — Telehealth: Payer: Self-pay | Admitting: Cardiovascular Disease

## 2020-01-02 NOTE — Telephone Encounter (Signed)
Stated a company called her and told her that Dr Willeen Cass ordered her a Garment/textile technologist

## 2020-01-02 NOTE — Telephone Encounter (Signed)
Left message to return call 

## 2020-01-02 NOTE — Telephone Encounter (Signed)
Informed patient that we would not give any orders like that here.  Patient stated that she did do some physical therapy that her pcp ordered recently.  Suggested that she call her pcp / therapy to see if anything like this was ordered on there end.  She verbalized understanding.

## 2020-01-05 ENCOUNTER — Telehealth: Payer: Self-pay | Admitting: Cardiovascular Disease

## 2020-01-05 NOTE — Telephone Encounter (Signed)
Patient called stating that she continues to get telephone calls from a company requesting her personal information about a Garment/textile technologist. They are telling the patient that Dr. Bronson Ing has ordered a foot massager.

## 2020-01-05 NOTE — Telephone Encounter (Signed)
Patient advised that we did not order a foot massager. Advised patient to have the company that is calling her take her name of their call list. Verbalized understanding.

## 2020-01-06 ENCOUNTER — Other Ambulatory Visit: Payer: Self-pay | Admitting: Family

## 2020-01-12 ENCOUNTER — Ambulatory Visit (INDEPENDENT_AMBULATORY_CARE_PROVIDER_SITE_OTHER): Payer: Medicare HMO

## 2020-01-12 ENCOUNTER — Other Ambulatory Visit: Payer: Self-pay

## 2020-01-12 DIAGNOSIS — I739 Peripheral vascular disease, unspecified: Secondary | ICD-10-CM | POA: Diagnosis not present

## 2020-01-18 ENCOUNTER — Telehealth: Payer: Self-pay | Admitting: *Deleted

## 2020-01-18 NOTE — Telephone Encounter (Signed)
Laurine Blazer, [place]  [phone] PM EDT    Patient notified. Copy to pcp.

## 2020-01-18 NOTE — Telephone Encounter (Signed)
-----   Message from Herminio Commons, MD sent at 01/12/2020 11:07 AM EDT ----- No significant blockages

## 2020-01-31 ENCOUNTER — Ambulatory Visit (INDEPENDENT_AMBULATORY_CARE_PROVIDER_SITE_OTHER): Payer: Medicare HMO | Admitting: *Deleted

## 2020-01-31 DIAGNOSIS — Z0000 Encounter for general adult medical examination without abnormal findings: Secondary | ICD-10-CM | POA: Diagnosis not present

## 2020-01-31 NOTE — Patient Instructions (Signed)
Preventive Care 38 Years and Older, Female Preventive care refers to lifestyle choices and visits with your health care provider that can promote health and wellness. This includes:  A yearly physical exam. This is also called an annual well check.  Regular dental and eye exams.  Immunizations.  Screening for certain conditions.  Healthy lifestyle choices, such as diet and exercise. What can I expect for my preventive care visit? Physical exam Your health care provider will check:  Height and weight. These may be used to calculate body mass index (BMI), which is a measurement that tells if you are at a healthy weight.  Heart rate and blood pressure.  Your skin for abnormal spots. Counseling Your health care provider may ask you questions about:  Alcohol, tobacco, and drug use.  Emotional well-being.  Home and relationship well-being.  Sexual activity.  Eating habits.  History of falls.  Memory and ability to understand (cognition).  Work and work Statistician.  Pregnancy and menstrual history. What immunizations do I need?  Influenza (flu) vaccine  This is recommended every year. Tetanus, diphtheria, and pertussis (Tdap) vaccine  You may need a Td booster every 10 years. Varicella (chickenpox) vaccine  You may need this vaccine if you have not already been vaccinated. Zoster (shingles) vaccine  You may need this after age 73. Pneumococcal conjugate (PCV13) vaccine  One dose is recommended after age 73. Pneumococcal polysaccharide (PPSV23) vaccine  One dose is recommended after age 73. Measles, mumps, and rubella (MMR) vaccine  You may need at least one dose of MMR if you were born in 1957 or later. You may also need a second dose. Meningococcal conjugate (MenACWY) vaccine  You may need this if you have certain conditions. Hepatitis A vaccine  You may need this if you have certain conditions or if you travel or work in places where you may be exposed  to hepatitis A. Hepatitis B vaccine  You may need this if you have certain conditions or if you travel or work in places where you may be exposed to hepatitis B. Haemophilus influenzae type b (Hib) vaccine  You may need this if you have certain conditions. You may receive vaccines as individual doses or as more than one vaccine together in one shot (combination vaccines). Talk with your health care provider about the risks and benefits of combination vaccines. What tests do I need? Blood tests  Lipid and cholesterol levels. These may be checked every 5 years, or more frequently depending on your overall health.  Hepatitis C test.  Hepatitis B test. Screening  Lung cancer screening. You may have this screening every year starting at age 73 if you have a 30-pack-year history of smoking and currently smoke or have quit within the past 15 years.  Colorectal cancer screening. All adults should have this screening starting at age 73 and continuing until age 15. Your health care provider may recommend screening at age 23 if you are at increased risk. You will have tests every 1-10 years, depending on your results and the type of screening test.  Diabetes screening. This is done by checking your blood sugar (glucose) after you have not eaten for a while (fasting). You may have this done every 1-3 years.  Mammogram. This may be done every 1-2 years. Talk with your health care provider about how often you should have regular mammograms.  BRCA-related cancer screening. This may be done if you have a family history of breast, ovarian, tubal, or peritoneal cancers.  Other tests  Sexually transmitted disease (STD) testing.  Bone density scan. This is done to screen for osteoporosis. You may have this done starting at age 73. Follow these instructions at home: Eating and drinking  Eat a diet that includes fresh fruits and vegetables, whole grains, lean protein, and low-fat dairy products. Limit  your intake of foods with high amounts of sugar, saturated fats, and salt.  Take vitamin and mineral supplements as recommended by your health care provider.  Do not drink alcohol if your health care provider tells you not to drink.  If you drink alcohol: ? Limit how much you have to 0-1 drink a day. ? Be aware of how much alcohol is in your drink. In the [place] one drink equals one 12 oz bottle of beer (355 mL), one 5 oz glass of wine (148 mL), or one 1 oz glass of hard liquor (44 mL). Lifestyle  Take daily care of your teeth and gums.  Stay active. Exercise for at least 30 minutes on 5 or more days each week.  Do not use any products that contain nicotine or tobacco, such as cigarettes, e-cigarettes, and chewing tobacco. If you need help quitting, ask your health care provider.  If you are sexually active, practice safe sex. Use a condom or other form of protection in order to prevent STIs (sexually transmitted infections).  Talk with your health care provider about taking a low-dose aspirin or statin. What's next?  Go to your health care provider once a year for a well check visit.  Ask your health care provider how often you should have your eyes and teeth checked.  Stay up to date on all vaccines. This information is not intended to replace advice given to you by your health care provider. Make sure you discuss any questions you have with your health care provider. Document Revised: 09/02/2018 Document Reviewed: 09/02/2018 Elsevier Patient Education  2020 [employer].

## 2020-01-31 NOTE — Progress Notes (Addendum)
MEDICARE ANNUAL WELLNESS VISIT  01/31/2020  Telephone Visit Disclaimer This Medicare AWV was conducted by telephone due to national recommendations for restrictions regarding the COVID-19 Pandemic (e.g. social distancing).  I verified, using two identifiers, that I am speaking with Sabrina [name] or their authorized healthcare agent. I discussed the limitations, risks, security, and privacy concerns of performing an evaluation and management service by telephone and the potential availability of an in-person appointment in the future. The patient expressed understanding and agreed to proceed.   Subjective:  Sabrina [name] is a 74 y.o. female patient of Hawks, Theador Hawthorne, FNP who had a Medicare Annual Wellness Visit today via telephone. Yarel is Retired and lives with their family. she has 3 children. she reports that she is socially active and does interact with friends/family regularly. she is moderately physically active and enjoys gardening and doing volunteer work with [employer].  Patient Care Team: Sharion Balloon, FNP as PCP - General (Family Medicine) Herminio Commons, MD as PCP - Cardiology (Cardiology) Cameron Sprang, MD as Consulting Physician (Neurology)  Advanced Directives 01/31/2020 10/06/2019 02/09/2019 01/24/2019 08/27/2017 08/22/2017 08/18/2016  Does Patient Have a Medical Advance Directive? No No No No Yes Yes Yes  Type of Advance Directive - - - - Public house manager  Does patient want to make changes to medical advance directive? - - - - No - Patient declined No - Patient declined No - Patient declined  Copy of [place] in Chart? - - - - No - copy requested No - copy requested No - copy requested  Would patient like information on creating a medical advance directive? No - Patient declined - No - Patient declined - - - [facility] Utilization Over the Past 12 Months: #  of hospitalizations or ER visits: 0 # of surgeries: 0  Review of Systems    Patient reports that her overall health is unchanged compared to last year.  History obtained from chart review  Patient Reported Readings (BP, Pulse, CBG, Weight, etc) none  Pain Assessment Pain : 0-10 Pain Score: 4  Pain Type: Chronic pain Pain Location: Back Pain Descriptors / Indicators: Aching, Burning Pain Onset: More than a month ago Pain Frequency: Intermittent Pain Relieving Factors: gabapentin, rest Effect of Pain on Daily Activities: mild  Pain Relieving Factors: gabapentin, rest  Current Medications & Allergies (verified) Allergies as of 01/31/2020       Reactions   Codeine Nausea And Vomiting   Stomach pain also        Medication List        Accurate as of Jan 31, 2020 11:39 AM. If you have any questions, ask your nurse or doctor.          albuterol 108 (90 Base) MCG/ACT inhaler Commonly known as: Ventolin HFA TAKE 2 PUFFS BY MOUTH EVERY 6 HOURS AS NEEDED FOR WHEEZE OR SHORTNESS OF BREATH   alendronate 70 MG tablet Commonly known as: Fosamax Take 1 tablet (70 mg total) by mouth every 7 (seven) days. Take with a full glass of water on an empty stomach.   amitriptyline 10 MG tablet Commonly known as: ELAVIL Take 4 tablets every night   apixaban 5 MG Tabs tablet Commonly known as: Eliquis Take 1 tablet (5 mg total) by mouth 2 (two) times daily.   atorvastatin 20 MG tablet Commonly known as: LIPITOR Take 1 tablet (  20 mg total) by mouth daily.   calcium carbonate 1500 (600 Ca) MG Tabs tablet Commonly known as: OSCAL Take by mouth daily with breakfast.   DULoxetine 60 MG capsule Commonly known as: CYMBALTA TAKE 1 CAPSULE BY MOUTH EVERY DAY   FISH OIL PO Take 1 capsule by mouth daily.   fluticasone 50 MCG/ACT nasal spray Commonly known as: FLONASE SPRAY 2 SPRAYS INTO EACH NOSTRIL EVERY DAY   furosemide 40 MG tablet Commonly known as: LASIX Take 1 tablet (40  mg total) by mouth daily.   gabapentin 100 MG capsule Commonly known as: NEURONTIN Take 1 capsule (100 mg total) by mouth 2 (two) times daily. Needs to be seen   mupirocin ointment 2 % Commonly known as: BACTROBAN APPLY TO AFFECTED AREA TWICE A DAY   ONE-A-DAY 50 PLUS PO Take 1 tablet by mouth 2 (two) times daily.   potassium chloride SA 20 MEQ tablet Commonly known as: KLOR-CON Take 1 tablet (20 mEq total) by mouth daily.   PRESCRIPTION MEDICATION Apply 1 application topically daily as needed (excema). HAL Ointment   triamcinolone ointment 0.5 % Commonly known as: KENALOG Apply 1 application topically 2 (two) times daily.   Vitamin D (Ergocalciferol) 1.25 MG (50000 UNIT) Caps capsule Commonly known as: DRISDOL Take 1 capsule (50,000 Units total) by mouth every 7 (seven) days.        History (reviewed): Past Medical History:  Diagnosis Date   Anemia    Ankle fracture, right    past hx. -"no surgery"   Anxiety    Asthma    CHF (congestive heart failure) ([place]) 2009   Chronic lower back pain    Collagen vascular disease (HCC)    COPD (chronic obstructive pulmonary disease) (HCC)    Depression    Fibromyalgia    GERD (gastroesophageal reflux disease)    Hyperlipidemia    Hypertension    Immature cataract of both eyes    Myocardial infarction ([employer])    "I've had a light one; don't know when it happened" (08/27/2017)   Osteoarthritis    Peripheral neuropathy    legs and feet   Persistent atrial fibrillation (HCC)    Tubular adenoma of colon    Past Surgical History:  Procedure Laterality Date   APPENDECTOMY     AV NODE ABLATION N/A 08/27/2017   Procedure: AV NODE ABLATION;  Surgeon: Evans Lance, MD;  Location: [place] [facility];  Service: Cardiovascular;  Laterality: N/A;   CHOLECYSTECTOMY OPEN  1978   DILATION AND CURETTAGE OF UTERUS     FEMUR FRACTURE SURGERY Left 2013   "put 7" rod in it"   FRACTURE SURGERY     LAPAROSCOPY  08/22/2016   Procedure:  LAPAROSCOPY DIAGNOSTIC;  Surgeon: [name], MD;  Location: [facility];  Service: General;;   MEDIAL PARTIAL KNEE REPLACEMENT Right 2005   "@ [facility]"   PACEMAKER IMPLANT N/A 08/27/2017   Procedure: PACEMAKER IMPLANT;  Surgeon: Evans Lance, MD;  Location: [place];  Service: Cardiovascular;  Laterality: N/A;   ROUX-EN-Y GASTRIC BYPASS  2002   [place]  2002   TONSILLECTOMY  1944   TUBAL LIGATION     VAGINAL HYSTERECTOMY     Family History  Problem Relation Age of Onset   Cancer Mother    Alzheimer's disease Mother    Heart disease Mother    Arthritis Father    Colon cancer Paternal Uncle 2   Asthma  Daughter    Arthritis Daughter    Obesity Daughter    Arthritis Son    Hyperlipidemia Son    Obesity Son    Arthritis Son    Obesity Son    Social History   Socioeconomic History   Marital status: Divorced    Spouse name: Not on file   Number of children: 3   Years of education: 2 years of college   Highest education level: Some college, no degree  Occupational History   Occupation: Retired  Tobacco Use   Smoking status: Former Smoker    Packs/day: 0.50    Years: 25.00    Pack years: 12.50    Types: Cigarettes    Quit date: 02/26/1993    Years since quitting: 26.9   Smokeless tobacco: Never Used  Substance and Sexual Activity   Alcohol use: Not Currently    Comment: 08/27/2017 "nothing since early 2000s"   Drug use: Not Currently   Sexual activity: Not Currently    Birth control/protection: Surgical  Other Topics Concern   Not on file  Social History Narrative   Pt is right handed   Lives in single story home with her grandson   Has 3 adult children   Associated degree    Retired Quarry manager   Social Determinants of Radio broadcast assistant Strain: Medium Risk   Difficulty of Paying Living Expenses: Somewhat hard  Food Insecurity: No Food Insecurity   Worried About Charity fundraiser in the Last Year: Never true   Arts development officer in the Last Year: Never true  Transportation Needs: No Transportation Needs   Lack of Transportation (Medical): No   Lack of Transportation (Non-Medical): No  Physical Activity: Sufficiently Active   Days of Exercise per Week: 3 days   Minutes of Exercise per Session: 50 min  Stress: No Stress Concern Present   Feeling of Stress : Only a little  Social Connections: Slightly Isolated   Frequency of Communication with Friends and Family: More than three times a week   Frequency of Social Gatherings with Friends and Family: More than three times a week   Attends Religious Services: More than 4 times per year   Active Member of [employer] or Organizations: Yes   Attends Archivist Meetings: More than 4 times per year   Marital Status: Divorced    Activities of Daily Living In your present state of health, do you have any difficulty performing the following activities: 01/31/2020 02/09/2019  Hearing? N N  Vision? N Y  Comment wears glasses-gets yearly eye exam pt has cataracts which she is waiting for surgery   Difficulty concentrating or making decisions? N N  Walking or climbing stairs? Y Y  Comment uses a cane while ambulating due to her knee pain  Dressing or bathing? N N  Doing errands, shopping? N N  Preparing Food and eating ? N N  Using the Toilet? N N  In the past six months, have you accidently leaked urine? Tempie Donning  Comment wears depends when she is running errands and doesn't know where the bathrooms are daily if she can't get to a bathroom quickly  Do you have problems with loss of bowel control? N N  Managing your Medications? N N  Managing your Finances? N N  Housekeeping or managing your Housekeeping? N N  Some recent data might be hidden    Patient Education/ Literacy How often do you need to have someone  help you when you read instructions, pamphlets, or other written materials from your doctor or pharmacy?: 1 - Never What is the last grade level you  completed in school?: 2 years of college-no degree  Exercise Current Exercise Habits: Home exercise routine, Type of exercise: walking, Time (Minutes): 45, Frequency (Times/Week): 3, Weekly Exercise (Minutes/Week): 135, Intensity: Mild, Exercise limited by: cardiac condition(s);respiratory conditions(s)  Diet Patient reports consuming 3 meals a day and 1 snack(s) a day Patient reports that her primary diet is: Regular Patient reports that she does have regular access to food.   Depression Screen PHQ 2/9 Scores 01/31/2020 09/30/2019 02/09/2019 11/03/2018 09/27/2018 06/29/2018 06/10/2018  PHQ - 2 Score 5 0 0 0 4 3 4   PHQ- 9 Score 11 - - - 11 14 10    Offered to schedule an appt with PCP to discuss medications and depression but pt states she will set up appt with the [facility] therapist to discuss and then will contact us for an appt if needed.  Fall Risk Fall Risk  01/31/2020 09/30/2019 09/06/2019 02/09/2019 01/24/2019  Falls in the past year? 0 1 1 1 1   Number falls in past yr: - 0 1 1 1   Injury with Fall? - 0 0 1 1  Risk for fall due to : - History of fall(s);Other (Comment) Impaired balance/gait Impaired balance/gait Impaired balance/gait  Risk for fall due to: Comment - Vision problems. - - -  Follow up - Falls prevention discussed Falls evaluation completed - -     Objective:  Sabrina [name] seemed alert and oriented and she participated appropriately during our telephone visit.  Blood Pressure Weight BMI  BP Readings from Last 3 Encounters:  12/16/19 122/72  10/04/19 120/90  09/30/19 128/89   Wt Readings from Last 3 Encounters:  12/16/19 178 lb (80.7 kg)  10/04/19 184 lb (83.5 kg)  09/30/19 182 lb 6.4 oz (82.7 kg)   BMI Readings from Last 1 Encounters:  12/16/19 30.55 kg/m    *Unable to obtain current vital signs, weight, and BMI due to telephone visit type  Hearing/Vision  Keairra did not seem to have difficulty with hearing/understanding during the telephone conversation Reports  that she has had a formal eye exam by an eye care professional within the past year Reports that she has not had a formal hearing evaluation within the past year *Unable to fully assess hearing and vision during telephone visit type  Cognitive Function: 6CIT Screen 01/31/2020 02/09/2019  What Year? 0 points 0 points  What month? 0 points 0 points  What time? 0 points 0 points  Count back from 20 0 points 0 points  Months in reverse 0 points 0 points  Repeat phrase 2 points 0 points  Total Score 2 0   (Normal:0-7, Significant for Dysfunction: >8)  Normal Cognitive Function Screening: Yes   Immunization & Health Maintenance Record Immunization History  Administered Date(s) Administered   Influenza, High Dose Seasonal PF 07/14/2017, 06/29/2018, 06/19/2019   Influenza,inj,Quad PF,6+ Mos 06/21/2014, 08/28/2015, 06/14/2016   Moderna SARS-COVID-2 Vaccination 12/21/2019   Pneumococcal Conjugate-13 10/19/2015   Pneumococcal Polysaccharide-23 06/14/2016, 06/19/2019   Tdap 05/19/2007, 06/29/2018   Zoster 05/19/2014    Health Maintenance  Topic Date Due   COVID-19 Vaccine (2 - Moderna 2-dose series) 01/18/2020   MAMMOGRAM  02/09/2020 (Originally 10/22/2017)   INFLUENZA VACCINE  04/22/2020   COLONOSCOPY  02/12/2021   TETANUS/TDAP  06/29/2028   DEXA SCAN  Completed   Hepatitis C Screening  Completed  PNA vac Low Risk Adult  Completed       Assessment  This is a routine wellness examination for Sabrina [name].  Health Maintenance: Due or Overdue Health Maintenance Due  Topic Date Due   COVID-19 Vaccine (2 - Moderna 2-dose series) 01/18/2020    Sabrina [name] does not need a referral for Community Assistance: Care Management:   no Social Work:    no Prescription Assistance:  no Nutrition/Diabetes Education:  no   Plan:  Personalized Goals Goals Addressed             This Visit's Progress    Increase physical activity       Pt has started going back to the  local [employer] 3 days a week and would like to go at least 5 days       Personalized Health Maintenance & Screening Recommendations  Health Maintenance up to date  Lung Cancer Screening Recommended: no (Low Dose CT Chest recommended if Age 73-80 years, 30 pack-year currently smoking OR have quit w/in past 15 years) Hepatitis C Screening recommended: no HIV Screening recommended: no  Advanced Directives: Written information was not prepared per patient's request.  Referrals & Orders No orders of the defined types were placed in this encounter.   Follow-up Plan Follow-up with Sharion Balloon, FNP as planned Consider scheduling an appt with Evelina Dun to discuss Depression    I have personally reviewed and noted the following in the patient's chart:   Medical and social history Use of alcohol, tobacco or illicit drugs  Current medications and supplements Functional ability and status Nutritional status Physical activity Advanced directives List of other physicians Hospitalizations, surgeries, and ER visits in previous 12 months Vitals Screenings to include cognitive, depression, and falls Referrals and appointments  In addition, I have reviewed and discussed with Sabrina [name] certain preventive protocols, quality metrics, and best practice recommendations. A written personalized care plan for preventive services as well as general preventive health recommendations is available and can be mailed to the patient at her request.      Milas Hock, LPN  [id number]  I have reviewed and agree with the above AWV documentation.   Evelina Dun, FNP

## 2020-02-24 ENCOUNTER — Telehealth: Payer: Self-pay | Admitting: Cardiovascular Disease

## 2020-02-24 NOTE — Telephone Encounter (Signed)
Busy

## 2020-02-24 NOTE — Telephone Encounter (Signed)
Patient called stating that on 6/1 she started having Lightheadedness with severe indigestion with chest pains.

## 2020-02-27 NOTE — Telephone Encounter (Signed)
Follow up   Patient is returning call per the previous message and also states that her b/p is low 96/58 71 hr. Please call.

## 2020-03-05 NOTE — Telephone Encounter (Signed)
No c/o chest pain, sob, or dizziness at present time.  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  Thinks it was muscular now after thinking about that.  Stated that she rested and drank lots of water.  No complaints at present time.

## 2020-03-06 ENCOUNTER — Telehealth: Payer: Self-pay | Admitting: *Deleted

## 2020-03-06 ENCOUNTER — Encounter: Payer: Medicare HMO | Admitting: *Deleted

## 2020-03-06 NOTE — Telephone Encounter (Signed)
Please schedule patient an appointment to be seen to discuss this.

## 2020-03-06 NOTE — Telephone Encounter (Signed)
Appointment made

## 2020-03-06 NOTE — Progress Notes (Signed)
PATIENT HAD AWV LAST MONTH

## 2020-03-08 ENCOUNTER — Encounter: Payer: Self-pay | Admitting: Family

## 2020-03-08 ENCOUNTER — Ambulatory Visit (INDEPENDENT_AMBULATORY_CARE_PROVIDER_SITE_OTHER): Payer: Medicare HMO | Admitting: Family

## 2020-03-08 DIAGNOSIS — F331 Major depressive disorder, recurrent, moderate: Secondary | ICD-10-CM | POA: Diagnosis not present

## 2020-03-08 DIAGNOSIS — F411 Generalized anxiety disorder: Secondary | ICD-10-CM

## 2020-03-08 MED ORDER — AMITRIPTYLINE HCL 75 MG PO TABS: 75.0000 mg | ORAL_TABLET | Freq: Every day | ORAL | 1 refills | Status: DC

## 2020-03-08 NOTE — Progress Notes (Signed)
   Virtual Visit via telephone Note Due to COVID-19 pandemic this visit was conducted virtually. This visit type was conducted due to national recommendations for restrictions regarding the COVID-19 Pandemic (e.g. social distancing, sheltering in place) in an effort to limit this patient's exposure and mitigate transmission in our community. All issues noted in this document were discussed and addressed.  A physical exam was not performed with this format.  I connected with Sabrina [name] on 03/08/20 at 11:35 AM  by telephone and verified that I am speaking with the correct person using two identifiers. Sabrina [name] is currently located at home and no one is currently with her during visit. The provider, Evelina Dun, FNP is located in their office at time of visit.  I discussed the limitations, risks, security and privacy concerns of performing an evaluation and management service by telephone and the availability of in person appointments. I also discussed with the patient that there may be a patient responsible charge related to this service. The patient expressed understanding and agreed to proceed.   History and Present Illness:  Pt calls the office today to discuss worsening depression and anxiety. Depression        This is a chronic problem.  The current episode started more than 1 year ago.   The onset quality is gradual.   The problem occurs intermittently.  The problem has been gradually worsening since onset.  Associated symptoms include helplessness, hopelessness, irritable, restlessness, decreased interest and sad.  Past treatments include SNRIs - Serotonin and norepinephrine reuptake inhibitors.  Past medical history includes anxiety.   Anxiety Presents for follow-up visit. Symptoms include depressed mood, excessive worry, nervous/anxious behavior and restlessness.         Review of Systems  Psychiatric/Behavioral: Positive for depression. The patient is  nervous/anxious.      Observations/Objective: No SOB or distress noted   Assessment and Plan: 1. Moderate episode of recurrent major depressive disorder (HCC) - amitriptyline (ELAVIL) 75 MG tablet; Take 1 tablet (75 mg total) by mouth at bedtime.  Dispense: 90 tablet; Refill: 1  2. Generalized anxiety disorder - amitriptyline (ELAVIL) 75 MG tablet; Take 1 tablet (75 mg total) by mouth at bedtime.  Dispense: 90 tablet; Refill: 1   Will increase Amitriptyline to 75 mg from 40 mg Continue Cymbalta 60 mg  Stress management discussed Possible adverse discused RTO in 4 weeks to recheck    I discussed the assessment and treatment plan with the patient. The patient was provided an opportunity to ask questions and all were answered. The patient agreed with the plan and demonstrated an understanding of the instructions.   The patient was advised to call back or seek an in-person evaluation if the symptoms worsen or if the condition fails to improve as anticipated.  The above assessment and management plan was discussed with the patient. The patient verbalized understanding of and has agreed to the management plan. Patient is aware to call the clinic if symptoms persist or worsen. Patient is aware when to return to the clinic for a follow-up visit. Patient educated on when it is appropriate to go to the emergency department.   Time call ended:  11:42 AM   I provided 7 minutes of non-face-to-face time during this encounter.    Evelina Dun, FNP

## 2020-03-23 ENCOUNTER — Ambulatory Visit: Payer: Medicare HMO | Admitting: Cardiovascular Disease

## 2020-03-28 NOTE — Progress Notes (Signed)
THIS WAS A TELEMEDICINE VISIT   Patient location: Home Provider location: Office  Cardiology Office Note  Date: 03/29/2020   ID: Sabrina [name], Sabrina [name] 05-Aug-1946, MRN [id number]  PCP:  Sharion Balloon, FNP  Cardiologist:  Kate Sable, MD Electrophysiologist:  None   Chief Complaint: F/U Atrial fibrillation, Pacemaker  History of Present Illness: Sabrina [name] is a 74 y.o. female with a history of Atrial fibrillation, pacemaker placement, COPD, HLD, HTN, CHF.  Last visit with Dr Bronson Ing via telemedicine 12/16/2019. Her Atrial fibrillation was symptomatically stable. EP Dr Lovena Le stopped Metoprolol 2/2 dizziness. Pacemaker was functioning normally. She continued anticoagulation with eliquis 5 mg po bid. Diastolic HF symptomatically stable and she was continuing Lasix 40 mg daily.  Patient denies any recent issues since last visit via telemedicine other than she had some chest pain/tightness when she was working out at [employer] and called regarding the pain.  There were several conversations during that time frame around June 1.  She called on February 24, 2020 stating she began having lightheadedness with severe indigestion with chest pains on on February 21, 2020.  Nursing staff called later on that same day at 1525 and phone was busy.  Patient called on June 7 at 2:33 PM and spoke with [name] stating her blood pressure was low at 96/58 with a heart rate of 71 and asked for clinical staff to call.  LPN Orson Slick called the patient on 6 4 at 11:48 AM.  Patient complained of no chest pain, shortness of breath, or dizziness at the times.  She had been working out at [employer] and did not realize it had 10 pounds of weight.  At that time she attributed the pain after thinking about it.  She states she rested and drink lots of water and had no complaints at the time.  She denies any anginal or exertional symptoms, palpitations or arrhythmias, orthostatic symptoms, CVA or TIA-like  symptoms.  Discussed her lower arterial duplex and ABI results and relayed the information that results were normal.   Past Medical History:  Diagnosis Date  . Anemia   . Ankle fracture, right    past hx. -"no surgery"  . Anxiety   . Asthma   . CHF (congestive heart failure) ([place]) 2009  . Chronic lower back pain   . Collagen vascular disease ([place])   . COPD (chronic obstructive pulmonary disease) ([place])   . Depression   . Fibromyalgia   . GERD (gastroesophageal reflux disease)   . Hyperlipidemia   . Hypertension   . Immature cataract of both eyes   . Myocardial infarction ([place])    "I've had a light one; don't know when it happened" (08/27/2017)  . Osteoarthritis   . Peripheral neuropathy    legs and feet  . Persistent atrial fibrillation ([place])   . Tubular adenoma of colon     Past Surgical History:  Procedure Laterality Date  . APPENDECTOMY    . AV NODE ABLATION N/A 08/27/2017   Procedure: AV NODE ABLATION;  Surgeon: Evans Lance, MD;  Location: [facility];  Service: Cardiovascular;  Laterality: N/A;  . CHOLECYSTECTOMY OPEN  1978  . DILATION AND CURETTAGE OF UTERUS    . FEMUR FRACTURE SURGERY Left 2013   "put 7" rod in it"  . FRACTURE SURGERY    . LAPAROSCOPY  08/22/2016   Procedure: LAPAROSCOPY DIAGNOSTIC;  Surgeon: [name], MD;  Location: [facility];  Service: General;;  .  MEDIAL PARTIAL KNEE REPLACEMENT Right 2005   "@ [facility]"  . PACEMAKER IMPLANT N/A 08/27/2017   Procedure: PACEMAKER IMPLANT;  Surgeon: Evans Lance, MD;  Location: [facility];  Service: Cardiovascular;  Laterality: N/A;  . ROUX-EN-Y GASTRIC BYPASS  2002   [place]  . SPLENECTOMY  2002  . TONSILLECTOMY  1944  . TUBAL LIGATION    . VAGINAL HYSTERECTOMY      Current Outpatient Medications  Medication Sig Dispense Refill  . albuterol (VENTOLIN HFA) 108 (90 Base) MCG/ACT inhaler TAKE 2 PUFFS BY MOUTH EVERY 6 HOURS AS NEEDED FOR WHEEZE OR SHORTNESS OF BREATH 18 g  0  . alendronate (FOSAMAX) 70 MG tablet Take 1 tablet (70 mg total) by mouth every 7 (seven) days. Take with a full glass of water on an empty stomach. 12 tablet 0  . amitriptyline (ELAVIL) 75 MG tablet Take 1 tablet (75 mg total) by mouth at bedtime. 90 tablet 1  . apixaban (ELIQUIS) 5 MG TABS tablet Take 1 tablet (5 mg total) by mouth 2 (two) times daily. 180 tablet 1  . atorvastatin (LIPITOR) 20 MG tablet Take 1 tablet (20 mg total) by mouth daily. 90 tablet 3  . calcium carbonate (OSCAL) 1500 (600 Ca) MG TABS tablet Take by mouth daily with breakfast.    . DULoxetine (CYMBALTA) 60 MG capsule TAKE 1 CAPSULE BY MOUTH EVERY DAY 90 capsule 0  . fluticasone (FLONASE) 50 MCG/ACT nasal spray SPRAY 2 SPRAYS INTO EACH NOSTRIL EVERY DAY 48 g 1  . furosemide (LASIX) 40 MG tablet Take 1 tablet (40 mg total) by mouth daily. 90 tablet 3  . gabapentin (NEURONTIN) 100 MG capsule Take 1 capsule (100 mg total) by mouth 2 (two) times daily. Needs to be seen 180 capsule 0  . Multiple Vitamins-Minerals (ONE-A-DAY 50 PLUS PO) Take 1 tablet by mouth 2 (two) times daily.    . Omega-3 Fatty Acids (FISH OIL PO) Take 1 capsule by mouth daily.    . potassium chloride SA (K-DUR,KLOR-CON) 20 MEQ tablet Take 1 tablet (20 mEq total) by mouth daily. 90 tablet 2  . PRESCRIPTION MEDICATION Apply 1 application topically daily as needed (excema). HAL Ointment    . Vitamin D, Ergocalciferol, (DRISDOL) 1.25 MG (50000 UNIT) CAPS capsule Take 1 capsule (50,000 Units total) by mouth every 7 (seven) days. 12 capsule 3  . triamcinolone ointment (KENALOG) 0.5 % Apply 1 application topically 2 (two) times daily. (Patient not taking: Reported on 03/29/2020) 30 g 0   No current facility-administered medications for this visit.   Allergies:  Codeine   Social History: The patient  reports that she quit smoking about 27 years ago. Her smoking use included cigarettes. She has a 12.50 pack-year smoking history. She has never used smokeless  tobacco. She reports previous alcohol use. She reports previous drug use.   Family History: The patient's family history includes Alzheimer's disease in her mother; Arthritis in her daughter, father, son, and son; Asthma in her daughter; Cancer in her mother; Colon cancer (age of onset: 40) in her paternal uncle; Heart disease in her mother; Hyperlipidemia in her son; Obesity in her daughter, son, and son.   ROS:  Please see the history of present illness. Otherwise, complete review of systems is positive for none.  All other systems are reviewed and negative.   Physical Exam: VS:  BP 126/82   Pulse 77   Ht 5\' 4"  (1.626 m)   Wt 170 lb (  77.1 kg)   BMI 29.18 kg/m , BMI Body mass index is 29.18 kg/m.  Wt Readings from Last 3 Encounters:  03/29/20 170 lb (77.1 kg)  12/16/19 178 lb (80.7 kg)  10/04/19 184 lb (83.5 kg)   Patient speaking in a normal speech pattern.  No audible evidence of cough, wheezing, or dyspnea noted.  ECG:    Recent Labwork: 09/30/2019: ALT 8; AST 16; BUN 20; Creatinine, Ser 0.88; Hemoglobin 13.7; Platelets 349; Potassium 3.7; Sodium 141     Component Value Date/Time   CHOL 199 09/30/2019 1018   TRIG 137 09/30/2019 1018   HDL 60 09/30/2019 1018   CHOLHDL 3.3 09/30/2019 1018   LDLCALC 115 (H) 09/30/2019 1018    Other Studies Reviewed Today:  Lower lower extremity arterial duplex and ABI result Summary: Right: Resting right ankle-brachial index is within normal range. No evidence of significant right lower extremity arterial disease. The right toe-brachial index is abnormal. Left: Resting left ankle-brachial index is within normal range. No evidence of significant left lower extremity arterial disease. The left toe-brachial index is normal.  Assessment and Plan:  1. Persistent atrial fibrillation ([place])   2. Bilateral leg pain   3. Chronic diastolic heart failure ([place])   4. Benign essential HTN    1. Persistent atrial fibrillation (HCC) No complaints of  palpitations or arrhythmias.  Heart rate 77 today.  Continue Eliquis 5 mg p.o. twice daily.  2. Bilateral leg pain Recent lower extremity arterial duplex showed normal ABIs bilaterally  3. Chronic diastolic heart failure (HCC) No recent weight gain or heart failure symptoms.  Continue Lasix 40 mg daily.  4. Benign essential HTN BP 126/82 today.  Medication Adjustments/Labs and Tests Ordered: Current medicines are reviewed at length with the patient today.  Concerns regarding medicines are outlined above.    Time spent with patient on the phone 15 minutes  Disposition: Follow-up with Dr. Bronson Ing or APP  Signed, Levell July, NP 03/29/2020 5:03 PM    [facility] [address], [place], [place] [address] Phone: [phone]; Fax: [phone]

## 2020-03-29 ENCOUNTER — Telehealth (INDEPENDENT_AMBULATORY_CARE_PROVIDER_SITE_OTHER): Payer: Medicare HMO | Admitting: Family Medicine

## 2020-03-29 ENCOUNTER — Encounter: Payer: Self-pay | Admitting: Family Medicine

## 2020-03-29 VITALS — BP 126/82 | HR 77 | Ht 64.0 in | Wt 170.0 lb

## 2020-03-29 DIAGNOSIS — I4819 Other persistent atrial fibrillation: Secondary | ICD-10-CM

## 2020-03-29 DIAGNOSIS — M79604 Pain in right leg: Secondary | ICD-10-CM

## 2020-03-29 DIAGNOSIS — M79605 Pain in left leg: Secondary | ICD-10-CM | POA: Diagnosis not present

## 2020-03-29 DIAGNOSIS — I5032 Chronic diastolic (congestive) heart failure: Secondary | ICD-10-CM

## 2020-03-29 DIAGNOSIS — I10 Essential (primary) hypertension: Secondary | ICD-10-CM | POA: Diagnosis not present

## 2020-03-29 NOTE — Patient Instructions (Addendum)

## 2020-03-30 ENCOUNTER — Ambulatory Visit (INDEPENDENT_AMBULATORY_CARE_PROVIDER_SITE_OTHER): Payer: Medicare HMO | Admitting: *Deleted

## 2020-03-30 DIAGNOSIS — I4819 Other persistent atrial fibrillation: Secondary | ICD-10-CM

## 2020-03-30 LAB — CUP PACEART REMOTE DEVICE CHECK
Battery Remaining Longevity: 96 mo
Battery Voltage: 2.91 V
Brady Statistic AP VP Percent: 0.42 %
Brady Statistic AP VS Percent: 99.53 %
Brady Statistic AS VP Percent: 0 %
Brady Statistic AS VS Percent: 0.04 %
Brady Statistic RA Percent Paced: 100 %
Brady Statistic RV Percent Paced: 0.42 %
Date Time Interrogation Session: 20210709020826
Implantable Lead Implant Date: 20181206
Implantable Lead Implant Date: 20181206
Implantable Lead Location: 753860
Implantable Lead Location: 753860
Implantable Lead Model: 3830
Implantable Lead Model: 5076
Implantable Pulse Generator Implant Date: 20181206
Lead Channel Impedance Value: 285 Ohm
Lead Channel Impedance Value: 380 Ohm
Lead Channel Impedance Value: 437 Ohm
Lead Channel Impedance Value: 532 Ohm
Lead Channel Sensing Intrinsic Amplitude: 11.625 mV
Lead Channel Sensing Intrinsic Amplitude: 11.625 mV
Lead Channel Sensing Intrinsic Amplitude: 4.5 mV
Lead Channel Sensing Intrinsic Amplitude: 4.5 mV
Lead Channel Setting Pacing Amplitude: 2 V
Lead Channel Setting Pacing Amplitude: 2.5 V
Lead Channel Setting Pacing Pulse Width: 0.3 ms
Lead Channel Setting Sensing Sensitivity: 2 mV

## 2020-04-03 NOTE — Progress Notes (Signed)
Remote pacemaker transmission.   

## 2020-04-04 ENCOUNTER — Other Ambulatory Visit: Payer: Self-pay | Admitting: Family

## 2020-04-04 DIAGNOSIS — Z1231 Encounter for screening mammogram for malignant neoplasm of breast: Secondary | ICD-10-CM

## 2020-04-09 ENCOUNTER — Telehealth (INDEPENDENT_AMBULATORY_CARE_PROVIDER_SITE_OTHER): Payer: Medicare HMO | Admitting: Neurology

## 2020-04-09 ENCOUNTER — Encounter: Payer: Self-pay | Admitting: Neurology

## 2020-04-09 ENCOUNTER — Other Ambulatory Visit: Payer: Self-pay

## 2020-04-09 VITALS — Ht 63.5 in | Wt 170.0 lb

## 2020-04-09 DIAGNOSIS — R55 Syncope and collapse: Secondary | ICD-10-CM | POA: Diagnosis not present

## 2020-04-09 DIAGNOSIS — G44309 Post-traumatic headache, unspecified, not intractable: Secondary | ICD-10-CM
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
## 2020-04-18 ENCOUNTER — Ambulatory Visit
Admission: RE | Admit: 2020-04-18 | Discharge: 2020-04-18 | Disposition: A | Discharge status: Home / Self Care | Payer: Medicare HMO | Source: Ambulatory Visit | Attending: Family | Admitting: Family

## 2020-04-18 ENCOUNTER — Other Ambulatory Visit: Payer: Self-pay

## 2020-04-18 DIAGNOSIS — Z1231 Encounter for screening mammogram for malignant neoplasm of breast: Secondary | ICD-10-CM | POA: Diagnosis not present

## 2020-05-17 ENCOUNTER — Ambulatory Visit: Payer: Self-pay

## 2020-05-24 ENCOUNTER — Ambulatory Visit: Payer: Medicare HMO | Attending: Internal Medicine

## 2020-05-24 DIAGNOSIS — Z23 Encounter for immunization: Secondary | ICD-10-CM

## 2020-05-24 NOTE — Progress Notes (Signed)
   Covid-19 Vaccination Clinic  Name:  Sabrina [name]    MRN: [id number] DOB: Jan 12, 1946  05/24/2020  Sabrina [name] was observed post Covid-19 immunization for 15 minutes without incident. She was provided with Vaccine Information Sheet and instruction to access the V-Safe system.   Sabrina [name] was instructed to call 911 with any severe reactions post vaccine: Marland Kitchen Difficulty breathing  . Swelling of face and throat  . A fast heartbeat  . A bad rash all over body  . Dizziness and weakness

## 2020-06-26 ENCOUNTER — Other Ambulatory Visit: Payer: Self-pay | Admitting: Family

## 2020-06-26 DIAGNOSIS — F411 Generalized anxiety disorder: Secondary | ICD-10-CM

## 2020-06-26 DIAGNOSIS — M26609 Unspecified temporomandibular joint disorder, unspecified side: Secondary | ICD-10-CM

## 2020-06-26 NOTE — Telephone Encounter (Signed)
[facility] NTBS OV 02/27/20 televisit RTO 6 wks mail order not sent

## 2020-06-29 ENCOUNTER — Ambulatory Visit (INDEPENDENT_AMBULATORY_CARE_PROVIDER_SITE_OTHER): Payer: Medicare HMO

## 2020-06-29 DIAGNOSIS — I442 Atrioventricular block, complete: Secondary | ICD-10-CM | POA: Diagnosis not present

## 2020-06-29 LAB — CUP PACEART REMOTE DEVICE CHECK
Battery Remaining Longevity: 92 mo
Battery Voltage: 2.91 V
Brady Statistic AP VP Percent: 0.22 %
Brady Statistic AP VS Percent: 99.66 %
Brady Statistic AS VP Percent: 0 %
Brady Statistic AS VS Percent: 0.12 %
Brady Statistic RA Percent Paced: 100 %
Brady Statistic RV Percent Paced: 0.22 %
Date Time Interrogation Session: 20211008015051
Implantable Lead Implant Date: 20181206
Implantable Lead Implant Date: 20181206
Implantable Lead Location: 753860
Implantable Lead Location: 753860
Implantable Lead Model: 3830
Implantable Lead Model: 5076
Implantable Pulse Generator Implant Date: 20181206
Lead Channel Impedance Value: 304 Ohm
Lead Channel Impedance Value: 361 Ohm
Lead Channel Impedance Value: 418 Ohm
Lead Channel Impedance Value: 513 Ohm
Lead Channel Sensing Intrinsic Amplitude: 11.625 mV
Lead Channel Sensing Intrinsic Amplitude: 11.625 mV
Lead Channel Sensing Intrinsic Amplitude: 4.5 mV
Lead Channel Sensing Intrinsic Amplitude: 4.5 mV
Lead Channel Setting Pacing Amplitude: 2 V
Lead Channel Setting Pacing Amplitude: 2.5 V
Lead Channel Setting Pacing Pulse Width: 0.3 ms
Lead Channel Setting Sensing Sensitivity: 2 mV

## 2020-07-03 NOTE — Progress Notes (Signed)
Remote pacemaker transmission.   

## 2020-07-27 ENCOUNTER — Ambulatory Visit: Payer: Medicare HMO | Admitting: Family

## 2020-08-08 ENCOUNTER — Ambulatory Visit (INDEPENDENT_AMBULATORY_CARE_PROVIDER_SITE_OTHER): Payer: Medicare HMO | Admitting: Family

## 2020-08-08 ENCOUNTER — Other Ambulatory Visit: Payer: Self-pay

## 2020-08-08 ENCOUNTER — Encounter: Payer: Self-pay | Admitting: Family

## 2020-08-08 VITALS — BP 112/74 | HR 72 | Temp 96.7°F | Ht 63.0 in | Wt 172.8 lb

## 2020-08-08 DIAGNOSIS — I4821 Permanent atrial fibrillation: Secondary | ICD-10-CM | POA: Diagnosis not present

## 2020-08-08 DIAGNOSIS — J4520 Mild intermittent asthma, uncomplicated: Secondary | ICD-10-CM | POA: Diagnosis not present

## 2020-08-08 DIAGNOSIS — E559 Vitamin D deficiency, unspecified: Secondary | ICD-10-CM

## 2020-08-08 DIAGNOSIS — I10 Essential (primary) hypertension: Secondary | ICD-10-CM

## 2020-08-08 DIAGNOSIS — Z950 Presence of cardiac pacemaker: Secondary | ICD-10-CM

## 2020-08-08 DIAGNOSIS — I5032 Chronic diastolic (congestive) heart failure: Secondary | ICD-10-CM | POA: Diagnosis not present

## 2020-08-08 DIAGNOSIS — M26609 Unspecified temporomandibular joint disorder, unspecified side: Secondary | ICD-10-CM

## 2020-08-08 DIAGNOSIS — E782 Mixed hyperlipidemia: Secondary | ICD-10-CM

## 2020-08-08 DIAGNOSIS — F331 Major depressive disorder, recurrent, moderate: Secondary | ICD-10-CM

## 2020-08-08 DIAGNOSIS — F411 Generalized anxiety disorder: Secondary | ICD-10-CM

## 2020-08-08 DIAGNOSIS — K219 Gastro-esophageal reflux disease without esophagitis: Secondary | ICD-10-CM

## 2020-08-08 DIAGNOSIS — J069 Acute upper respiratory infection, unspecified: Secondary | ICD-10-CM | POA: Diagnosis not present

## 2020-08-08 DIAGNOSIS — G4700 Insomnia, unspecified: Secondary | ICD-10-CM

## 2020-08-08 DIAGNOSIS — D509 Iron deficiency anemia, unspecified: Secondary | ICD-10-CM

## 2020-08-08 DIAGNOSIS — E669 Obesity, unspecified: Secondary | ICD-10-CM

## 2020-08-08 LAB — CMP14+EGFR
ALT: 13 IU/L (ref 0–32)
AST: 19 IU/L (ref 0–40)
Albumin/Globulin Ratio: 1.5 (ref 1.2–2.2)
Albumin: 3.9 g/dL (ref 3.7–4.7)
Alkaline Phosphatase: 125 IU/L — ABNORMAL HIGH (ref 44–121)
BUN/Creatinine Ratio: 13 (ref 12–28)
BUN: 14 mg/dL (ref 8–27)
Bilirubin Total: 0.5 mg/dL (ref 0.0–1.2)
CO2: 25 mmol/L (ref 20–29)
Calcium: 9.5 mg/dL (ref 8.7–10.3)
Chloride: 101 mmol/L (ref 96–106)
Creatinine, Ser: 1.05 mg/dL — ABNORMAL HIGH (ref 0.57–1.00)
GFR calc Af Amer: 61 mL/min/{1.73_m2} (ref >59)
GFR calc non Af Amer: 53 mL/min/{1.73_m2} — ABNORMAL LOW (ref >59)
Globulin, Total: 2.6 g/dL (ref 1.5–4.5)
Glucose: 89 mg/dL (ref 65–99)
Potassium: 4.5 mmol/L (ref 3.5–5.2)
Sodium: 143 mmol/L (ref 134–144)
Total Protein: 6.5 g/dL (ref 6.0–8.5)

## 2020-08-08 LAB — CBC WITH DIFFERENTIAL/PLATELET
Basophils Absolute: 0.1 10*3/uL (ref 0.0–0.2)
Basos: 1 %
EOS (ABSOLUTE): 0.2 10*3/uL (ref 0.0–0.4)
Eos: 3 %
Hematocrit: 42 % (ref 34.0–46.6)
Hemoglobin: 13.7 g/dL (ref 11.1–15.9)
Immature Grans (Abs): 0 10*3/uL (ref 0.0–0.1)
Immature Granulocytes: 0 %
Lymphocytes Absolute: 2.2 10*3/uL (ref 0.7–3.1)
Lymphs: 30 %
MCH: 29.7 pg (ref 26.6–33.0)
MCHC: 32.6 g/dL (ref 31.5–35.7)
MCV: 91 fL (ref 79–97)
Monocytes Absolute: 0.6 10*3/uL (ref 0.1–0.9)
Monocytes: 7 %
Neutrophils Absolute: 4.3 10*3/uL (ref 1.4–7.0)
Neutrophils: 59 %
Platelets: 377 10*3/uL (ref 150–450)
RBC: 4.62 x10E6/uL (ref 3.77–5.28)
RDW: 13.1 % (ref 11.7–15.4)
WBC: 7.5 10*3/uL (ref 3.4–10.8)

## 2020-08-08 MED ORDER — AMITRIPTYLINE HCL 75 MG PO TABS: 75.0000 mg | ORAL_TABLET | Freq: Every day | ORAL | 1 refills | Status: DC

## 2020-08-08 MED ORDER — ALENDRONATE SODIUM 70 MG PO TABS: 70.0000 mg | ORAL_TABLET | ORAL | 0 refills | Status: DC

## 2020-08-08 MED ORDER — FUROSEMIDE 40 MG PO TABS: 40.0000 mg | ORAL_TABLET | Freq: Every day | ORAL | 3 refills | Status: DC

## 2020-08-08 MED ORDER — VITAMIN D (ERGOCALCIFEROL) 1.25 MG (50000 UNIT) PO CAPS: 50000.0000 [IU] | ORAL_CAPSULE | ORAL | 3 refills | Status: DC

## 2020-08-08 MED ORDER — ATORVASTATIN CALCIUM 20 MG PO TABS: 20.0000 mg | ORAL_TABLET | Freq: Every day | ORAL | 3 refills | Status: DC

## 2020-08-08 MED ORDER — DULOXETINE HCL 60 MG PO CPEP: ORAL_CAPSULE | ORAL | 0 refills | Status: DC

## 2020-08-08 MED ORDER — GABAPENTIN 100 MG PO CAPS: 100.0000 mg | ORAL_CAPSULE | Freq: Two times a day (BID) | ORAL | 0 refills | Status: DC

## 2020-08-08 MED ORDER — POTASSIUM CHLORIDE CRYS ER 20 MEQ PO TBCR: 20.0000 meq | EXTENDED_RELEASE_TABLET | Freq: Every day | ORAL | 2 refills | Status: DC

## 2020-08-08 MED ORDER — ALBUTEROL SULFATE HFA 108 (90 BASE) MCG/ACT IN AERS: INHALATION_SPRAY | RESPIRATORY_TRACT | 0 refills | Status: DC

## 2020-08-08 MED ORDER — FLUTICASONE PROPIONATE 50 MCG/ACT NA SUSP: NASAL | 1 refills | Status: DC

## 2020-08-08 MED ORDER — APIXABAN 5 MG PO TABS: 5.0000 mg | ORAL_TABLET | Freq: Two times a day (BID) | ORAL | 1 refills | Status: DC

## 2020-08-08 NOTE — Patient Instructions (Signed)

## 2020-08-08 NOTE — Progress Notes (Signed)
Subjective:    Patient ID: Sabrina [name], female    DOB: Jun 22, 1946, 74 y.o.   MRN: [id number]  Chief Complaint  Patient presents with  . Medical Management of Chronic Issues   Pt presents to the office today for chronic follow up. Pt is followed by Cardiologists every 4 months for A Fib and CHF. Has a pacemaker.   She is followed by Neurologists every 3 months for weakness and near syncope episodes. Hypertension This is a chronic problem. The current episode started more than 1 year ago. The problem has been resolved since onset. The problem is controlled. Associated symptoms include anxiety, malaise/fatigue, peripheral edema and shortness of breath. Risk factors for coronary artery disease include dyslipidemia, diabetes mellitus and obesity. The current treatment provides moderate improvement. Hypertensive end-organ damage includes CAD/MI and heart failure. There is no history of kidney disease.  Congestive Heart Failure Presents for follow-up visit. Associated symptoms include edema, fatigue and shortness of breath.  Asthma She complains of shortness of breath. There is no cough or wheezing. This is a chronic problem. The current episode started more than 1 year ago. The problem occurs intermittently. The problem has been waxing and waning. Associated symptoms include heartburn and malaise/fatigue. Her past medical history is significant for asthma.  Gastroesophageal Reflux She complains of belching and heartburn. She reports no coughing or no wheezing. This is a chronic problem. The current episode started more than 1 year ago. The problem occurs occasionally. The problem has been waxing and waning. Associated symptoms include fatigue. She has tried a PPI for the symptoms. The treatment provided moderate relief.  Anemia Presents for follow-up visit. Symptoms include bruises/bleeds easily and malaise/fatigue. Past medical history includes heart failure.  Hyperlipidemia This is a  chronic problem. The current episode started more than 1 year ago. Associated symptoms include shortness of breath. Current antihyperlipidemic treatment includes statins. The current treatment provides moderate improvement of lipids. Risk factors for coronary artery disease include dyslipidemia, hypertension, a sedentary lifestyle and post-menopausal.  Anxiety Presents for follow-up visit. Symptoms include depressed mood, insomnia, irritability, nervous/anxious behavior, restlessness and shortness of breath. Symptoms occur most days. The severity of symptoms is moderate.   Her past medical history is significant for anemia and asthma.  Depression        This is a chronic problem.  The current episode started more than 1 year ago.   The onset quality is gradual.   The problem occurs intermittently.  Associated symptoms include fatigue, insomnia, irritable, restlessness and sad.  Past treatments include SNRIs - Serotonin and norepinephrine reuptake inhibitors.  Compliance with treatment is good.  Past medical history includes anxiety.   Insomnia Primary symptoms: difficulty falling asleep, frequent awakening, malaise/fatigue.  The current episode started more than one year. The onset quality is gradual. The problem occurs intermittently. PMH includes: depression.      Review of Systems  Constitutional: Positive for fatigue, irritability and malaise/fatigue.  Respiratory: Positive for shortness of breath. Negative for cough and wheezing.   Gastrointestinal: Positive for heartburn.  Hematological: Bruises/bleeds easily.  Psychiatric/Behavioral: Positive for depression. The patient is nervous/anxious and has insomnia.   All other systems reviewed and are negative.      Objective:   Physical Exam Vitals reviewed.  Constitutional:      General: She is irritable. She is not in acute distress.    Appearance: She is well-developed.  HENT:     Head: Normocephalic and atraumatic.     Right Ear:  Tympanic membrane normal.     Left Ear: Tympanic membrane normal.  Eyes:     Pupils: Pupils are equal, round, and reactive to light.  Neck:     Thyroid: No thyromegaly.  Cardiovascular:     Rate and Rhythm: Normal rate and regular rhythm.     Heart sounds: Normal heart sounds. No murmur heard.   Pulmonary:     Effort: Pulmonary effort is normal. No respiratory distress.     Breath sounds: Normal breath sounds. No wheezing.  Abdominal:     General: Bowel sounds are normal. There is no distension.     Palpations: Abdomen is soft.     Tenderness: There is no abdominal tenderness.  Musculoskeletal:        General: No tenderness. Normal range of motion.     Cervical back: Normal range of motion and neck supple.  Skin:    General: Skin is warm and dry.  Neurological:     Mental Status: She is alert and oriented to person, place, and time.     Cranial Nerves: No cranial nerve deficit.     Deep Tendon Reflexes: Reflexes are normal and symmetric.  Psychiatric:        Behavior: Behavior normal.        Thought Content: Thought content normal.        Judgment: Judgment normal.          BP 112/74   Pulse 72   Temp (!) 96.7 F (35.9 C) (Temporal)   Ht '5\' 3"'  (1.6 m)   Wt 172 lb 12.8 oz (78.4 kg)   SpO2 99%   BMI 30.61 kg/m   Assessment & Plan:  Sabrina [name] comes in today with chief complaint of Medical Management of Chronic Issues   Diagnosis and orders addressed:  1. GAD (generalized anxiety disorder) - DULoxetine (CYMBALTA) 60 MG capsule; TAKE 1 CAPSULE BY MOUTH EVERY DAY  Dispense: 90 capsule; Refill: 0 - CMP14+EGFR - CBC with Differential/Platelet  2. TMJ (temporomandibular joint disorder) - gabapentin (NEURONTIN) 100 MG capsule; Take 1 capsule (100 mg total) by mouth 2 (two) times daily. Needs to be seen  Dispense: 180 capsule; Refill: 0 - CMP14+EGFR - CBC with Differential/Platelet  3. Moderate episode of recurrent major depressive disorder (HCC) -  amitriptyline (ELAVIL) 75 MG tablet; Take 1 tablet (75 mg total) by mouth at bedtime.  Dispense: 90 tablet; Refill: 1 - CMP14+EGFR - CBC with Differential/Platelet  4. Generalized anxiety disorder - amitriptyline (ELAVIL) 75 MG tablet; Take 1 tablet (75 mg total) by mouth at bedtime.  Dispense: 90 tablet; Refill: 1 - CMP14+EGFR - CBC with Differential/Platelet   6. Permanent atrial fibrillation (HCC) - CMP14+EGFR - CBC with Differential/Platelet  7. Benign essential HTN - CMP14+EGFR - CBC with Differential/Platelet  8. Chronic diastolic heart failure (HCC) - CMP14+EGFR - CBC with Differential/Platelet  9. Mild intermittent asthma without complication - UDJ49+FWYO - CBC with Differential/Platelet  10. Gastroesophageal reflux disease, unspecified whether esophagitis present - CMP14+EGFR - CBC with Differential/Platelet  11. Mixed hyperlipidemia - CMP14+EGFR - CBC with Differential/Platelet  12. Insomnia, unspecified type - CMP14+EGFR - CBC with Differential/Platelet  13. Iron deficiency anemia, unspecified iron deficiency anemia type - CMP14+EGFR - CBC with Differential/Platelet  14. Obesity (BMI 30-39.9) - CMP14+EGFR - CBC with Differential/Platelet  15. Pacemaker - CMP14+EGFR - CBC with Differential/Platelet  16. Vitamin D deficiency - CMP14+EGFR - CBC with Differential/Platelet   Labs pending Health Maintenance reviewed Diet and exercise encouraged  Follow  up plan: 6 months   Evelina Dun, FNP

## 2020-09-19 DIAGNOSIS — E1122 Type 2 diabetes mellitus with diabetic chronic kidney disease: Secondary | ICD-10-CM | POA: Diagnosis not present

## 2020-09-19 DIAGNOSIS — Z6841 Body Mass Index (BMI) 40.0 and over, adult: Secondary | ICD-10-CM | POA: Diagnosis not present

## 2020-09-28 ENCOUNTER — Ambulatory Visit (INDEPENDENT_AMBULATORY_CARE_PROVIDER_SITE_OTHER): Payer: Medicare HMO

## 2020-09-28 DIAGNOSIS — I442 Atrioventricular block, complete: Secondary | ICD-10-CM | POA: Diagnosis not present

## 2020-09-28 LAB — CUP PACEART REMOTE DEVICE CHECK
Battery Remaining Longevity: 84 mo
Battery Voltage: 2.9 V
Brady Statistic AP VP Percent: 0.31 %
Brady Statistic AP VS Percent: 99.63 %
Brady Statistic AS VP Percent: 0 %
Brady Statistic AS VS Percent: 0.06 %
Brady Statistic RA Percent Paced: 100 %
Brady Statistic RV Percent Paced: 0.31 %
Date Time Interrogation Session: 20220107004833
Implantable Lead Implant Date: 20181206
Implantable Lead Implant Date: 20181206
Implantable Lead Location: 753860
Implantable Lead Location: 753860
Implantable Lead Model: 3830
Implantable Lead Model: 5076
Implantable Pulse Generator Implant Date: 20181206
Lead Channel Impedance Value: 285 Ohm
Lead Channel Impedance Value: 342 Ohm
Lead Channel Impedance Value: 399 Ohm
Lead Channel Impedance Value: 513 Ohm
Lead Channel Sensing Intrinsic Amplitude: 11.625 mV
Lead Channel Sensing Intrinsic Amplitude: 11.625 mV
Lead Channel Sensing Intrinsic Amplitude: 4.5 mV
Lead Channel Sensing Intrinsic Amplitude: 4.5 mV
Lead Channel Setting Pacing Amplitude: 2 V
Lead Channel Setting Pacing Amplitude: 2.5 V
Lead Channel Setting Pacing Pulse Width: 0.3 ms
Lead Channel Setting Sensing Sensitivity: 2 mV

## 2020-10-12 NOTE — Progress Notes (Signed)
Remote pacemaker transmission.   

## 2020-11-02 ENCOUNTER — Other Ambulatory Visit: Payer: Self-pay | Admitting: Family

## 2020-11-02 DIAGNOSIS — F411 Generalized anxiety disorder: Secondary | ICD-10-CM

## 2020-11-09 ENCOUNTER — Other Ambulatory Visit: Payer: Self-pay

## 2020-11-09 ENCOUNTER — Encounter: Payer: Self-pay | Admitting: Internal Medicine

## 2020-11-09 ENCOUNTER — Ambulatory Visit (INDEPENDENT_AMBULATORY_CARE_PROVIDER_SITE_OTHER): Payer: Medicare HMO | Admitting: Internal Medicine

## 2020-11-09 VITALS — BP 112/66 | HR 77 | Ht 64.0 in | Wt 169.0 lb

## 2020-11-09 DIAGNOSIS — I4821 Permanent atrial fibrillation: Secondary | ICD-10-CM | POA: Diagnosis not present

## 2020-11-09 DIAGNOSIS — I442 Atrioventricular block, complete: Secondary | ICD-10-CM | POA: Diagnosis not present

## 2020-11-09 DIAGNOSIS — Z950 Presence of cardiac pacemaker: Secondary | ICD-10-CM

## 2020-11-09 DIAGNOSIS — I10 Essential (primary) hypertension: Secondary | ICD-10-CM | POA: Diagnosis not present

## 2020-11-09 NOTE — Progress Notes (Signed)
HPI Ms. Bowie returns today for followup. She is a pleasant 75yo woman with a h/o sinus node dysfunction, s/p PPM insertion. She has a h/o morbid obesity and has started diet and exercise therapy. She has lost 30 lbs. She feels much better. She cut out all sugar and most of her starches.  Allergies  Allergen Reactions  . Codeine Nausea And Vomiting    Stomach pain also     Current Outpatient Medications  Medication Sig Dispense Refill  . albuterol (VENTOLIN HFA) 108 (90 Base) MCG/ACT inhaler TAKE 2 PUFFS BY MOUTH EVERY 6 HOURS AS NEEDED FOR WHEEZE OR SHORTNESS OF BREATH 18 g 0  . alendronate (FOSAMAX) 70 MG tablet Take 1 tablet (70 mg total) by mouth every 7 (seven) days. Take with a full glass of water on an empty stomach. 12 tablet 0  . amitriptyline (ELAVIL) 75 MG tablet Take 1 tablet (75 mg total) by mouth at bedtime. 90 tablet 1  . apixaban (ELIQUIS) 5 MG TABS tablet Take 1 tablet (5 mg total) by mouth 2 (two) times daily. 180 tablet 1  . atorvastatin (LIPITOR) 20 MG tablet Take 1 tablet (20 mg total) by mouth daily. 90 tablet 3  . calcium carbonate (OSCAL) 1500 (600 Ca) MG TABS tablet Take by mouth daily with breakfast.    . DULoxetine (CYMBALTA) 60 MG capsule TAKE 1 CAPSULE EVERY DAY 90 capsule 0  . fluticasone (FLONASE) 50 MCG/ACT nasal spray SPRAY 2 SPRAYS INTO EACH NOSTRIL EVERY DAY 48 g 1  . furosemide (LASIX) 40 MG tablet Take 1 tablet (40 mg total) by mouth daily. 90 tablet 3  . gabapentin (NEURONTIN) 100 MG capsule Take 1 capsule (100 mg total) by mouth 2 (two) times daily. Needs to be seen 180 capsule 0  . Multiple Vitamins-Minerals (ONE-A-DAY 50 PLUS PO) Take 1 tablet by mouth 2 (two) times daily.    . Omega-3 Fatty Acids (FISH OIL PO) Take 1 capsule by mouth daily.    . potassium chloride SA (KLOR-CON) 20 MEQ tablet Take 1 tablet (20 mEq total) by mouth daily. 90 tablet 2  . PRESCRIPTION MEDICATION Apply 1 application topically daily as needed (excema). HAL  Ointment    . triamcinolone ointment (KENALOG) 0.5 % Apply 1 application topically 2 (two) times daily. 30 g 0  . Vitamin D, Ergocalciferol, (DRISDOL) 1.25 MG (50000 UNIT) CAPS capsule Take 1 capsule (50,000 Units total) by mouth every 7 (seven) days. 12 capsule 3   No current facility-administered medications for this visit.     Past Medical History:  Diagnosis Date  . Anemia   . Ankle fracture, right    past hx. -"no surgery"  . Anxiety   . Asthma   . CHF (congestive heart failure) ([place]) 2009  . Chronic lower back pain   . Collagen vascular disease ([place])   . COPD (chronic obstructive pulmonary disease) ([place])   . Depression   . Fibromyalgia   . GERD (gastroesophageal reflux disease)   . Hyperlipidemia   . Hypertension   . Immature cataract of both eyes   . Myocardial infarction ([place])    "I've had a light one; don't know when it happened" (08/27/2017)  . Osteoarthritis   . Peripheral neuropathy    legs and feet  . Persistent atrial fibrillation ([place])   . Tubular adenoma of colon     ROS:   All systems reviewed and negative except as noted in the HPI.   Past Surgical  History:  Procedure Laterality Date  . APPENDECTOMY    . AV NODE ABLATION N/A 08/27/2017   Procedure: AV NODE ABLATION;  Surgeon: Evans Lance, MD;  Location: [facility];  Service: Cardiovascular;  Laterality: N/A;  . CHOLECYSTECTOMY OPEN  1978  . DILATION AND CURETTAGE OF UTERUS    . FEMUR FRACTURE SURGERY Left 2013   "put 7" rod in it"  . FRACTURE SURGERY    . LAPAROSCOPY  08/22/2016   Procedure: LAPAROSCOPY DIAGNOSTIC;  Surgeon: [name], MD;  Location: [facility];  Service: General;;  . MEDIAL PARTIAL KNEE REPLACEMENT Right 2005   "@ [facility]"  . PACEMAKER IMPLANT N/A 08/27/2017   Procedure: PACEMAKER IMPLANT;  Surgeon: Evans Lance, MD;  Location: [facility];  Service: Cardiovascular;  Laterality: N/A;  . ROUX-EN-Y GASTRIC BYPASS  2002   [place]  .  SPLENECTOMY  2002  . TONSILLECTOMY  1944  . TUBAL LIGATION    . VAGINAL HYSTERECTOMY       Family History  Problem Relation Age of Onset  . Cancer Mother   . Alzheimer's disease Mother   . Heart disease Mother   . Arthritis Father   . Colon cancer Paternal Uncle 50  . Asthma Daughter   . Arthritis Daughter   . Obesity Daughter   . Arthritis Son   . Hyperlipidemia Son   . Obesity Son   . Arthritis Son   . Obesity Son      Social History   Socioeconomic History  . Marital status: Divorced    Spouse name: Not on file  . Number of children: 3  . Years of education: 2 years of college  . Highest education level: Some college, no degree  Occupational History  . Occupation: Retired  Tobacco Use  . Smoking status: Former Smoker    Packs/day: 0.50    Years: 25.00    Pack years: 12.50    Types: Cigarettes    Quit date: 02/26/1993    Years since quitting: 27.7  . Smokeless tobacco: Never Used  Vaping Use  . Vaping Use: Never used  Substance and Sexual Activity  . Alcohol use: Not Currently    Comment: 08/27/2017 "nothing since early 2000s"  . Drug use: Not Currently  . Sexual activity: Not Currently    Birth control/protection: Surgical  Other Topics Concern  . Not on file  Social History Narrative   Pt is right handed   Lives in single story home with her grandson   Has 3 adult children   Associated degree    Retired Quarry manager   Social Determinants of Radio broadcast assistant Strain: Medium Risk  . Difficulty of Paying Living Expenses: Somewhat hard  Food Insecurity: No Food Insecurity  . Worried About Charity fundraiser in the Last Year: Never true  . Ran Out of Food in the Last Year: Never true  Transportation Needs: No Transportation Needs  . Lack of Transportation (Medical): No  . Lack of Transportation (Non-Medical): No  Physical Activity: Sufficiently Active  . Days of Exercise per Week: 3 days  . Minutes of Exercise per Session: 50 min  Stress: No  Stress Concern Present  . Feeling of Stress : Only a little  Social Connections: Moderately Integrated  . Frequency of Communication with Friends and Family: More than three times a week  . Frequency of Social Gatherings with Friends and Family: More than three times a week  .  Attends Religious Services: More than 4 times per year  . Active Member of Clubs or Organizations: Yes  . Attends Archivist Meetings: More than 4 times per year  . Marital Status: Divorced  Human resources officer Violence: Not At Risk  . Fear of Current or Ex-Partner: No  . Emotionally Abused: No  . Physically Abused: No  . Sexually Abused: No     BP 112/66   Pulse 77   Ht 5\' 4"  (1.626 m)   Wt 169 lb (76.7 kg)   SpO2 98%   BMI 29.01 kg/m   Physical Exam:  Well appearing NAD HEENT: Unremarkable Neck:  No JVD, no thyromegally Lymphatics:  No adenopathy Back:  No CVA tenderness Lungs:  Clear with no wheezes HEART:  Regular rate rhythm, no murmurs, no rubs, no clicks Abd:  soft, positive bowel sounds, no organomegally, no rebound, no guarding Ext:  2 plus pulses, no edema, no cyanosis, no clubbing Skin:  No rashes no nodules Neuro:  CN II through XII intact, motor grossly intact  EKG - nsr with av pacing  DEVICE  Normal device function.  See PaceArt for details.   Assess/Plan: 1. Sinus node dysfunction - she is maintaining NSR.  2. PPM -her medtronic DDD PM is working normally. We will recheck in several months. 3. HTN - her bp is well controlled.  4. Obesity - she has lost 30 lbs and she plans to continue her weight loss program.  Carleene Overlie Casmer Yepiz,MD

## 2020-11-09 NOTE — Patient Instructions (Signed)
Medication Instructions:  Your physician recommends that you continue on your current medications as directed. Please refer to the Current Medication list given to you today.  Labwork: None ordered.  Testing/Procedures: None ordered.  Follow-Up: Your physician wants you to follow-up in: one year with [name], MD or one of the following Advanced Practice Providers on your designated Care Team:    Chanetta Marshall, NP  Tommye Standard, PA-C  Legrand Como "Jonni Sanger" [place], [place]  Remote monitoring is used to monitor your Pacemaker from home. This monitoring reduces the number of office visits required to check your device to one time per year. It allows [place] to keep an eye on the functioning of your device to ensure it is working properly. You are scheduled for a device check from home on 12/28/2020. You may send your transmission at any time that day. If you have a wireless device, the transmission will be sent automatically. After your physician reviews your transmission, you will receive a postcard with your next transmission date.  Any Other Special Instructions Will Be Listed Below (If Applicable).  If you need a refill on your cardiac medications before your next appointment, please call your pharmacy.

## 2020-12-03 DIAGNOSIS — H2513 Age-related nuclear cataract, bilateral: Secondary | ICD-10-CM | POA: Diagnosis not present

## 2020-12-03 DIAGNOSIS — H52209 Unspecified astigmatism, unspecified eye: Secondary | ICD-10-CM | POA: Diagnosis not present

## 2020-12-03 DIAGNOSIS — Z0100 Encounter for examination of eyes and vision without abnormal findings: Secondary | ICD-10-CM | POA: Diagnosis not present

## 2020-12-03 DIAGNOSIS — H43813 Vitreous degeneration, bilateral: Secondary | ICD-10-CM | POA: Diagnosis not present

## 2020-12-08 DIAGNOSIS — Z885 Allergy status to narcotic agent status: Secondary | ICD-10-CM | POA: Diagnosis not present

## 2020-12-08 DIAGNOSIS — Z980 Intestinal bypass and anastomosis status: Secondary | ICD-10-CM | POA: Diagnosis not present

## 2020-12-08 DIAGNOSIS — M7750 Other enthesopathy of unspecified foot: Secondary | ICD-10-CM | POA: Diagnosis not present

## 2020-12-08 DIAGNOSIS — R197 Diarrhea, unspecified: Secondary | ICD-10-CM | POA: Diagnosis not present

## 2020-12-08 DIAGNOSIS — K449 Diaphragmatic hernia without obstruction or gangrene: Secondary | ICD-10-CM | POA: Diagnosis not present

## 2020-12-08 DIAGNOSIS — J80 Acute respiratory distress syndrome: Secondary | ICD-10-CM | POA: Diagnosis not present

## 2020-12-08 DIAGNOSIS — K922 Gastrointestinal hemorrhage, unspecified: Secondary | ICD-10-CM | POA: Insufficient documentation

## 2020-12-08 DIAGNOSIS — Z7901 Long term (current) use of anticoagulants: Secondary | ICD-10-CM | POA: Diagnosis not present

## 2020-12-08 DIAGNOSIS — R600 Localized edema: Secondary | ICD-10-CM | POA: Diagnosis not present

## 2020-12-08 DIAGNOSIS — K91840 Postprocedural hemorrhage and hematoma of a digestive system organ or structure following a digestive system procedure: Secondary | ICD-10-CM | POA: Diagnosis not present

## 2020-12-08 DIAGNOSIS — J9811 Atelectasis: Secondary | ICD-10-CM | POA: Diagnosis not present

## 2020-12-08 DIAGNOSIS — K5730 Diverticulosis of large intestine without perforation or abscess without bleeding: Secondary | ICD-10-CM | POA: Diagnosis not present

## 2020-12-08 DIAGNOSIS — K5731 Diverticulosis of large intestine without perforation or abscess with bleeding: Secondary | ICD-10-CM | POA: Diagnosis not present

## 2020-12-08 DIAGNOSIS — R109 Unspecified abdominal pain: Secondary | ICD-10-CM | POA: Diagnosis not present

## 2020-12-08 DIAGNOSIS — D62 Acute posthemorrhagic anemia: Secondary | ICD-10-CM | POA: Diagnosis not present

## 2020-12-08 DIAGNOSIS — Z95810 Presence of automatic (implantable) cardiac defibrillator: Secondary | ICD-10-CM | POA: Diagnosis not present

## 2020-12-08 DIAGNOSIS — R55 Syncope and collapse: Secondary | ICD-10-CM | POA: Diagnosis not present

## 2020-12-08 DIAGNOSIS — D649 Anemia, unspecified: Secondary | ICD-10-CM | POA: Diagnosis not present

## 2020-12-08 DIAGNOSIS — T68XXXA Hypothermia, initial encounter: Secondary | ICD-10-CM | POA: Diagnosis not present

## 2020-12-08 DIAGNOSIS — K660 Peritoneal adhesions (postprocedural) (postinfection): Secondary | ICD-10-CM | POA: Diagnosis not present

## 2020-12-08 DIAGNOSIS — N179 Acute kidney failure, unspecified: Secondary | ICD-10-CM | POA: Diagnosis not present

## 2020-12-08 DIAGNOSIS — I82411 Acute embolism and thrombosis of right femoral vein: Secondary | ICD-10-CM | POA: Diagnosis not present

## 2020-12-08 DIAGNOSIS — D131 Benign neoplasm of stomach: Secondary | ICD-10-CM | POA: Diagnosis not present

## 2020-12-08 DIAGNOSIS — R571 Hypovolemic shock: Secondary | ICD-10-CM | POA: Diagnosis not present

## 2020-12-08 DIAGNOSIS — R578 Other shock: Secondary | ICD-10-CM | POA: Diagnosis not present

## 2020-12-08 DIAGNOSIS — R1110 Vomiting, unspecified: Secondary | ICD-10-CM | POA: Diagnosis not present

## 2020-12-08 DIAGNOSIS — R569 Unspecified convulsions: Secondary | ICD-10-CM | POA: Diagnosis not present

## 2020-12-08 DIAGNOSIS — K921 Melena: Secondary | ICD-10-CM | POA: Diagnosis not present

## 2020-12-08 DIAGNOSIS — Z95828 Presence of other vascular implants and grafts: Secondary | ICD-10-CM | POA: Diagnosis not present

## 2020-12-08 DIAGNOSIS — I2510 Atherosclerotic heart disease of native coronary artery without angina pectoris: Secondary | ICD-10-CM | POA: Diagnosis not present

## 2020-12-08 DIAGNOSIS — Z903 Acquired absence of stomach [part of]: Secondary | ICD-10-CM | POA: Diagnosis not present

## 2020-12-08 DIAGNOSIS — D72829 Elevated white blood cell count, unspecified: Secondary | ICD-10-CM | POA: Diagnosis not present

## 2020-12-08 DIAGNOSIS — K3189 Other diseases of stomach and duodenum: Secondary | ICD-10-CM | POA: Diagnosis not present

## 2020-12-08 DIAGNOSIS — I82401 Acute embolism and thrombosis of unspecified deep veins of right lower extremity: Secondary | ICD-10-CM | POA: Diagnosis not present

## 2020-12-08 DIAGNOSIS — R000 Tachycardia, unspecified: Secondary | ICD-10-CM | POA: Diagnosis not present

## 2020-12-08 DIAGNOSIS — Z48815 Encounter for surgical aftercare following surgery on the digestive system: Secondary | ICD-10-CM | POA: Diagnosis not present

## 2020-12-08 DIAGNOSIS — I959 Hypotension, unspecified: Secondary | ICD-10-CM | POA: Diagnosis not present

## 2020-12-08 DIAGNOSIS — R0602 Shortness of breath: Secondary | ICD-10-CM | POA: Diagnosis not present

## 2020-12-08 DIAGNOSIS — I509 Heart failure, unspecified: Secondary | ICD-10-CM | POA: Diagnosis not present

## 2020-12-08 DIAGNOSIS — Z452 Encounter for adjustment and management of vascular access device: Secondary | ICD-10-CM | POA: Diagnosis not present

## 2020-12-08 DIAGNOSIS — J9600 Acute respiratory failure, unspecified whether with hypoxia or hypercapnia: Secondary | ICD-10-CM | POA: Diagnosis not present

## 2020-12-08 DIAGNOSIS — E669 Obesity, unspecified: Secondary | ICD-10-CM | POA: Diagnosis not present

## 2020-12-08 DIAGNOSIS — I824Y1 Acute embolism and thrombosis of unspecified deep veins of right proximal lower extremity: Secondary | ICD-10-CM | POA: Diagnosis not present

## 2020-12-08 DIAGNOSIS — R0902 Hypoxemia: Secondary | ICD-10-CM | POA: Diagnosis not present

## 2020-12-08 DIAGNOSIS — I252 Old myocardial infarction: Secondary | ICD-10-CM | POA: Diagnosis not present

## 2020-12-08 DIAGNOSIS — J9601 Acute respiratory failure with hypoxia: Secondary | ICD-10-CM | POA: Diagnosis not present

## 2020-12-08 DIAGNOSIS — I4811 Longstanding persistent atrial fibrillation: Secondary | ICD-10-CM | POA: Diagnosis not present

## 2020-12-08 DIAGNOSIS — R531 Weakness: Secondary | ICD-10-CM | POA: Diagnosis not present

## 2020-12-08 DIAGNOSIS — J90 Pleural effusion, not elsewhere classified: Secondary | ICD-10-CM | POA: Diagnosis not present

## 2020-12-08 DIAGNOSIS — M79672 Pain in left foot: Secondary | ICD-10-CM | POA: Diagnosis not present

## 2020-12-08 DIAGNOSIS — K9589 Other complications of other bariatric procedure: Secondary | ICD-10-CM | POA: Diagnosis not present

## 2020-12-08 DIAGNOSIS — N390 Urinary tract infection, site not specified: Secondary | ICD-10-CM | POA: Diagnosis not present

## 2020-12-08 DIAGNOSIS — E872 Acidosis: Secondary | ICD-10-CM | POA: Diagnosis not present

## 2020-12-08 DIAGNOSIS — R6889 Other general symptoms and signs: Secondary | ICD-10-CM | POA: Diagnosis not present

## 2020-12-08 DIAGNOSIS — G9341 Metabolic encephalopathy: Secondary | ICD-10-CM | POA: Diagnosis not present

## 2020-12-08 DIAGNOSIS — J449 Chronic obstructive pulmonary disease, unspecified: Secondary | ICD-10-CM | POA: Diagnosis not present

## 2020-12-08 DIAGNOSIS — Z9884 Bariatric surgery status: Secondary | ICD-10-CM | POA: Diagnosis not present

## 2020-12-08 DIAGNOSIS — Z20822 Contact with and (suspected) exposure to covid-19: Secondary | ICD-10-CM | POA: Diagnosis not present

## 2020-12-08 DIAGNOSIS — I482 Chronic atrial fibrillation, unspecified: Secondary | ICD-10-CM | POA: Diagnosis not present

## 2020-12-08 DIAGNOSIS — R4020 Unspecified coma: Secondary | ICD-10-CM | POA: Diagnosis not present

## 2020-12-08 DIAGNOSIS — Z6831 Body mass index (BMI) 31.0-31.9, adult: Secondary | ICD-10-CM | POA: Diagnosis not present

## 2020-12-08 DIAGNOSIS — K5791 Diverticulosis of intestine, part unspecified, without perforation or abscess with bleeding: Secondary | ICD-10-CM | POA: Diagnosis not present

## 2020-12-08 DIAGNOSIS — I4891 Unspecified atrial fibrillation: Secondary | ICD-10-CM | POA: Diagnosis not present

## 2020-12-08 DIAGNOSIS — J45909 Unspecified asthma, uncomplicated: Secondary | ICD-10-CM | POA: Diagnosis not present

## 2020-12-08 DIAGNOSIS — M6281 Muscle weakness (generalized): Secondary | ICD-10-CM | POA: Diagnosis not present

## 2020-12-08 DIAGNOSIS — M79671 Pain in right foot: Secondary | ICD-10-CM | POA: Diagnosis not present

## 2020-12-12 DIAGNOSIS — M7750 Other enthesopathy of unspecified foot: Secondary | ICD-10-CM | POA: Insufficient documentation

## 2020-12-14 DIAGNOSIS — D62 Acute posthemorrhagic anemia: Secondary | ICD-10-CM | POA: Insufficient documentation

## 2020-12-14 DIAGNOSIS — I82409 Acute embolism and thrombosis of unspecified deep veins of unspecified lower extremity: Secondary | ICD-10-CM | POA: Insufficient documentation

## 2020-12-14 DIAGNOSIS — Z7901 Long term (current) use of anticoagulants: Secondary | ICD-10-CM | POA: Insufficient documentation

## 2020-12-29 DIAGNOSIS — K922 Gastrointestinal hemorrhage, unspecified: Secondary | ICD-10-CM | POA: Insufficient documentation

## 2020-12-29 DIAGNOSIS — J45909 Unspecified asthma, uncomplicated: Secondary | ICD-10-CM | POA: Diagnosis not present

## 2020-12-29 DIAGNOSIS — Z9884 Bariatric surgery status: Secondary | ICD-10-CM | POA: Diagnosis not present

## 2020-12-29 DIAGNOSIS — I482 Chronic atrial fibrillation, unspecified: Secondary | ICD-10-CM | POA: Diagnosis not present

## 2020-12-29 DIAGNOSIS — Z48815 Encounter for surgical aftercare following surgery on the digestive system: Secondary | ICD-10-CM | POA: Diagnosis not present

## 2020-12-29 DIAGNOSIS — D62 Acute posthemorrhagic anemia: Secondary | ICD-10-CM | POA: Diagnosis not present

## 2020-12-29 DIAGNOSIS — Z95828 Presence of other vascular implants and grafts: Secondary | ICD-10-CM | POA: Insufficient documentation

## 2020-12-29 DIAGNOSIS — I2510 Atherosclerotic heart disease of native coronary artery without angina pectoris: Secondary | ICD-10-CM | POA: Diagnosis not present

## 2020-12-29 DIAGNOSIS — Z903 Acquired absence of stomach [part of]: Secondary | ICD-10-CM | POA: Diagnosis not present

## 2020-12-29 DIAGNOSIS — R6889 Other general symptoms and signs: Secondary | ICD-10-CM | POA: Diagnosis not present

## 2020-12-29 DIAGNOSIS — M7750 Other enthesopathy of unspecified foot: Secondary | ICD-10-CM | POA: Insufficient documentation

## 2020-12-29 DIAGNOSIS — Z7901 Long term (current) use of anticoagulants: Secondary | ICD-10-CM | POA: Diagnosis not present

## 2020-12-29 DIAGNOSIS — M6281 Muscle weakness (generalized): Secondary | ICD-10-CM | POA: Insufficient documentation

## 2020-12-29 DIAGNOSIS — R188 Other ascites: Secondary | ICD-10-CM | POA: Diagnosis not present

## 2020-12-29 DIAGNOSIS — Z980 Intestinal bypass and anastomosis status: Secondary | ICD-10-CM | POA: Diagnosis not present

## 2020-12-30 DIAGNOSIS — D62 Acute posthemorrhagic anemia: Secondary | ICD-10-CM | POA: Diagnosis not present

## 2020-12-30 DIAGNOSIS — R188 Other ascites: Secondary | ICD-10-CM | POA: Insufficient documentation

## 2020-12-30 DIAGNOSIS — Z86718 Personal history of other venous thrombosis and embolism: Secondary | ICD-10-CM | POA: Insufficient documentation

## 2020-12-30 DIAGNOSIS — K922 Gastrointestinal hemorrhage, unspecified: Secondary | ICD-10-CM | POA: Diagnosis not present

## 2020-12-30 DIAGNOSIS — I82401 Acute embolism and thrombosis of unspecified deep veins of right lower extremity: Secondary | ICD-10-CM | POA: Insufficient documentation

## 2020-12-30 DIAGNOSIS — I482 Chronic atrial fibrillation, unspecified: Secondary | ICD-10-CM | POA: Diagnosis not present

## 2021-01-04 DIAGNOSIS — D62 Acute posthemorrhagic anemia: Secondary | ICD-10-CM | POA: Diagnosis not present

## 2021-01-04 DIAGNOSIS — I2510 Atherosclerotic heart disease of native coronary artery without angina pectoris: Secondary | ICD-10-CM | POA: Diagnosis not present

## 2021-01-04 DIAGNOSIS — J45909 Unspecified asthma, uncomplicated: Secondary | ICD-10-CM | POA: Diagnosis not present

## 2021-01-04 DIAGNOSIS — K922 Gastrointestinal hemorrhage, unspecified: Secondary | ICD-10-CM | POA: Diagnosis not present

## 2021-01-04 DIAGNOSIS — Z7901 Long term (current) use of anticoagulants: Secondary | ICD-10-CM | POA: Diagnosis not present

## 2021-01-04 DIAGNOSIS — I482 Chronic atrial fibrillation, unspecified: Secondary | ICD-10-CM | POA: Diagnosis not present

## 2021-01-04 DIAGNOSIS — Z9884 Bariatric surgery status: Secondary | ICD-10-CM | POA: Diagnosis not present

## 2021-01-08 ENCOUNTER — Other Ambulatory Visit: Payer: Self-pay

## 2021-01-08 ENCOUNTER — Encounter: Payer: Self-pay | Admitting: Internal Medicine

## 2021-01-08 NOTE — Patient Outreach (Signed)
[facility]) Care Management  01/08/2021  Sabrina [name] [birth year] [id number]   Referral Date: 01/08/21 Referral Source: [employer] Report Referral Reason: d/c [place] 01-07-21   Telephone call to patient for follow up discharge.  Patient reports she just got home yesterday from the facility. Patient states she is doing good. She states she was able to bathe and dress herself and change her dressing.  Discussed signs of infection and notifying physician for any problems. Asked patient about home health. She states she had the option but will talk with her MD this week at her appointment if she feels she needs someone.    She lives in the home with her grandson Tamera Punt who assists as needed.  She states she has all her medication but declined to review today.    Patient with recent GI bleed for which she needed surgical intervention and now has dressing to her stomach per patient.  Patient also has history of HTN, Heart failure, A. Fibrillation, and GERD.    Discussed [facility] services and follow up. Patient declined at this time and will discuss her medical condition with her physician. Advised patient that CM did send letter for future reference if she needs to contact CM.  She verbalized understanding.  Plan: RN CM will close case.    Jone Baseman, RN, MSN [place] Management Care Management Coordinator Direct Line [phone] Cell [phone] Toll Free: [phone]  Fax: [phone]

## 2021-01-08 NOTE — Patient Outreach (Signed)
[facility]) Care Management  01/08/2021  JAMYA STARRY 1945-11-07 [id number]   Referral Date: 01/08/21 Referral Source: [employer] Report Referral Reason: d/c [place] 01-07-21   Outreach Attempt: No answer. Unable to leave a message.     Plan: RN CM will attempt again within 4 business days and send letter.   Jone Baseman, RN, MSN [facility] Care Management Care Management Coordinator Direct Line [phone] Toll Free: [phone]  Fax: [phone]

## 2021-01-10 ENCOUNTER — Ambulatory Visit (INDEPENDENT_AMBULATORY_CARE_PROVIDER_SITE_OTHER): Payer: Medicare HMO | Admitting: Family

## 2021-01-10 ENCOUNTER — Encounter: Payer: Self-pay | Admitting: Family

## 2021-01-10 ENCOUNTER — Other Ambulatory Visit: Payer: Self-pay

## 2021-01-10 VITALS — BP 121/82 | HR 73 | Temp 97.5°F | Ht 64.0 in | Wt 166.4 lb

## 2021-01-10 DIAGNOSIS — Z09 Encounter for follow-up examination after completed treatment for conditions other than malignant neoplasm: Secondary | ICD-10-CM

## 2021-01-10 DIAGNOSIS — Z5189 Encounter for other specified aftercare: Secondary | ICD-10-CM

## 2021-01-10 DIAGNOSIS — Z9884 Bariatric surgery status: Secondary | ICD-10-CM

## 2021-01-10 DIAGNOSIS — Z903 Acquired absence of stomach [part of]: Secondary | ICD-10-CM | POA: Diagnosis not present

## 2021-01-10 DIAGNOSIS — Z8719 Personal history of other diseases of the digestive system: Secondary | ICD-10-CM | POA: Diagnosis not present

## 2021-01-10 NOTE — Progress Notes (Signed)
Subjective:    Patient ID: Sabrina [name], female    DOB: May 30, 1946, 75 y.o.   MRN: [id number] ' Chief Complaint  Patient presents with  . Hospitalization Follow-up    Rehab    Pt presents to the office today for hospital follow up. She went to the ED on 12/08/20 with an Upper GI bleed. She was taking Eliquis for A Fib,  But was unable to tolerate. She had a gastrectomy. She has a IVC filter placed.  She was then discharged from the hospital on 12/29/20 to a Rehab facility and discharged home on 01/07/21.   Unfortunately, I can not see the notes from one of the hospitals. We have requested those records.   She is changing her abdominal wound twice a day and has abdominal binder in placed. She reports a clear blood discharge. Denies any pain or fever. She has a follow up with her surgeon next week.   She refused home health at the time of her discharge.  Wound Check She was originally treated more than 14 days ago. Her temperature was unmeasured prior to arrival. There is no redness present. There is no swelling present.      Review of Systems  All other systems reviewed and are negative.  Family History  Problem Relation Age of Onset  . Cancer Mother   . Alzheimer's disease Mother   . Heart disease Mother   . Arthritis Father   . Colon cancer Paternal Uncle 57  . Asthma Daughter   . Arthritis Daughter   . Obesity Daughter   . Arthritis Son   . Hyperlipidemia Son   . Obesity Son   . Arthritis Son   . Obesity Son    Social History   Socioeconomic History  . Marital status: Divorced    Spouse name: Not on file  . Number of children: 3  . Years of education: 2 years of college  . Highest education level: Some college, no degree  Occupational History  . Occupation: Retired  Tobacco Use  . Smoking status: Former Smoker    Packs/day: 0.50    Years: 25.00    Pack years: 12.50    Types: Cigarettes    Quit date: 02/26/1993    Years since quitting: 27.8  .  Smokeless tobacco: Never Used  Vaping Use  . Vaping Use: Never used  Substance and Sexual Activity  . Alcohol use: Not Currently    Comment: 08/27/2017 "nothing since early 2000s"  . Drug use: Not Currently  . Sexual activity: Not Currently    Birth control/protection: Surgical  Other Topics Concern  . Not on file  Social History Narrative   ** Merged History Encounter **       Pt is right handed Lives in single story home with her grandson Has 3 adult children Associated degree  Retired Quarry manager   Social Determinants of Radio broadcast assistant Strain: Medium Risk  . Difficulty of Paying Living Expenses: Somewhat hard  Food Insecurity: No Food Insecurity  . Worried About Charity fundraiser in the Last Year: Never true  . Ran Out of Food in the Last Year: Never true  Transportation Needs: No Transportation Needs  . Lack of Transportation (Medical): No  . Lack of Transportation (Non-Medical): No  Physical Activity: Sufficiently Active  . Days of Exercise per Week: 3 days  . Minutes of Exercise per Session: 50 min  Stress: No Stress Concern Present  . Feeling of Stress :  Only a little  Social Connections: Moderately Integrated  . Frequency of Communication with Friends and Family: More than three times a week  . Frequency of Social Gatherings with Friends and Family: More than three times a week  . Attends Religious Services: More than 4 times per year  . Active Member of Clubs or Organizations: Yes  . Attends Archivist Meetings: More than 4 times per year  . Marital Status: Divorced       Objective:   Physical Exam Vitals reviewed.  Constitutional:      General: She is not in acute distress.    Appearance: She is well-developed.  HENT:     Head: Normocephalic and atraumatic.     Right Ear: Tympanic membrane normal.     Left Ear: Tympanic membrane normal.  Eyes:     Pupils: Pupils are equal, round, and reactive to light.  Neck:     Thyroid: No  thyromegaly.  Cardiovascular:     Rate and Rhythm: Normal rate and regular rhythm.     Heart sounds: Normal heart sounds. No murmur heard.   Pulmonary:     Effort: Pulmonary effort is normal. No respiratory distress.     Breath sounds: Normal breath sounds. No wheezing.  Abdominal:     General: Bowel sounds are normal. There is no distension.     Palpations: Abdomen is soft.     Tenderness: There is no abdominal tenderness.  Musculoskeletal:        General: No tenderness. Normal range of motion.     Cervical back: Normal range of motion and neck supple.  Skin:    General: Skin is warm and dry.       Neurological:     Mental Status: She is alert and oriented to person, place, and time.     Cranial Nerves: No cranial nerve deficit.     Motor: Weakness present.     Gait: Gait abnormal.     Deep Tendon Reflexes: Reflexes are normal and symmetric.  Psychiatric:        Behavior: Behavior normal.        Thought Content: Thought content normal.        Judgment: Judgment normal.     Abdominal wound- packing applied to two area on lower abdominal area. 1. Was approx 5 cm deep and packed with wet to dry dressing. 2. Second area was approx 2 cm deep and wet to dry dressing applied       BP 121/82   Pulse 73   Temp (!) 97.5 F (36.4 C) (Temporal)   Ht '5\' 4"'  (1.626 m)   Wt 166 lb 6.4 oz (75.5 kg)   BMI 28.56 kg/m   Assessment & Plan:  Sabrina [name] comes in today with chief complaint of Hospitalization Follow-up (Rehab )   Diagnosis and orders addressed:  1. Hospital discharge follow-up - Ambulatory referral to [facility] with Differential/Platelet  2. S/P gastric bypass - Ambulatory referral to [facility] with Differential/Platelet  3. History of GI blee - Ambulatory referral to [place] with Differential/Platelet  4. History of gastrectom - Ambulatory referral to [facility]  with Differential/Platelet  5. Visit for wound check  Labs pending Continue medications  Keep follow up with with Surgeon  Home health and PT ordered today Continue to do home dressing BID Report any Redness, fever, increase discharge, or  pain  RTO in 2 weeks  Spent approx 40 mins with patient discussing wounds, wound care       Evelina Dun, FNP

## 2021-01-10 NOTE — Patient Instructions (Signed)
Wound Care, Adult Taking care of your wound properly can help to prevent pain, infection, and scarring. It can also help your wound heal more quickly. Follow instructions from your health care provider about how to care for your wound. Supplies needed:  Soap and water.  Wound cleanser.  Gauze.  If needed, a clean bandage (dressing) or other type of wound dressing material to cover or place in the wound. Follow your health care provider's instructions about what dressing supplies to use.  Cream or ointment to apply to the wound, if told by your health care provider. How to care for your wound Cleaning the wound Ask your health care provider how to clean the wound. This may include:  Using mild soap and water or a wound cleanser.  Using a clean gauze to pat the wound dry after cleaning it. Do not rub or scrub the wound. Dressing care  Wash your hands with soap and water for at least 20 seconds before and after you change the dressing. If soap and water are not available, use hand sanitizer.  Change your dressing as told by your health care provider. This may include: ? Cleaning or rinsing out (irrigating) the wound. ? Placing a dressing over the wound or in the wound (packing). ? Covering the wound with an outer dressing.  Leave any stitches (sutures), skin glue, or adhesive strips in place. These skin closures may need to stay in place for 2 weeks or longer. If adhesive strip edges start to loosen and curl up, you may trim the loose edges. Do not remove adhesive strips completely unless your health care provider tells you to do that.  Ask your health care provider when you can leave the wound uncovered. Checking for infection Check your wound area every day for signs of infection. Check for:  More redness, swelling, or pain.  Fluid or blood.  Warmth.  Pus or a bad smell.   Follow these instructions at home Medicines  If you were prescribed an antibiotic medicine, cream, or  ointment, take or apply it as told by your health care provider. Do not stop using the antibiotic even if your condition improves.  If you were prescribed pain medicine, take it 30 minutes before you do any wound care or as told by your health care provider.  Take over-the-counter and prescription medicines only as told by your health care provider. Eating and drinking  Eat a diet that includes protein, vitamin A, vitamin C, and other nutrient-rich foods to help the wound heal. ? Foods rich in protein include meat, fish, eggs, dairy, beans, and nuts. ? Foods rich in vitamin A include carrots and dark green, leafy vegetables. ? Foods rich in vitamin C include citrus fruits, tomatoes, broccoli, and peppers.  Drink enough fluid to keep your urine pale yellow. General instructions  Do not take baths, swim, use a hot tub, or do anything that would put the wound underwater until your health care provider approves. Ask your health care provider if you may take showers. You may only be allowed to take sponge baths.  Do not scratch or pick at the wound. Keep it covered as told by your health care provider.  Return to your normal activities as told by your health care provider. Ask your health care provider what activities are safe for you.  Protect your wound from the sun when you are outside for the first 6 months, or for as long as told by your health care provider. Cover   up the scar area or apply sunscreen that has an SPF of at least 30.  Do not use any products that contain nicotine or tobacco, such as cigarettes, e-cigarettes, and chewing tobacco. These may delay wound healing. If you need help quitting, ask your health care provider.  Keep all follow-up visits as told by your health care provider. This is important. Contact a health care provider if:  You received a tetanus shot and you have swelling, severe pain, redness, or bleeding at the injection site.  Your pain is not controlled  with medicine.  You have any of these signs of infection: ? More redness, swelling, or pain around the wound. ? Fluid or blood coming from the wound. ? Warmth coming from the wound. ? Pus or a bad smell coming from the wound. ? A fever or chills.  You are nauseous or you vomit.  You are dizzy. Get help right away if:  You have a red streak of skin near the area around your wound.  Your wound has been closed with staples, sutures, skin glue, or adhesive strips and it begins to open up and separate.  Your wound is bleeding, and the bleeding does not stop with gentle pressure.  You have a rash.  You faint.  You have trouble breathing. These symptoms may represent a serious problem that is an emergency. Do not wait to see if the symptoms will go away. Get medical help right away. Call your local emergency services (911 in the [place] Do not drive yourself to the hospital. Summary  Always wash your hands with soap and water for at least 20 seconds before and after changing your dressing.  Change your dressing as told by your health care provider.  To help with healing, eat foods that are rich in protein, vitamin A, vitamin C, and other nutrients.  Check your wound every day for signs of infection. Contact your health care provider if you suspect that your wound is infected. This information is not intended to replace advice given to you by your health care provider. Make sure you discuss any questions you have with your health care provider. Document Revised: 06/24/2019 Document Reviewed: 06/24/2019 Elsevier Patient Education  2021 Elsevier Inc.  

## 2021-01-11 ENCOUNTER — Ambulatory Visit (INDEPENDENT_AMBULATORY_CARE_PROVIDER_SITE_OTHER): Payer: Medicare HMO | Admitting: Family Medicine

## 2021-01-11 ENCOUNTER — Encounter: Payer: Self-pay | Admitting: Family Medicine

## 2021-01-11 VITALS — BP 108/68 | HR 72 | Temp 98.0°F | Ht 64.0 in | Wt 166.4 lb

## 2021-01-11 DIAGNOSIS — S31109A Unspecified open wound of abdominal wall, unspecified quadrant without penetration into peritoneal cavity, initial encounter: Secondary | ICD-10-CM | POA: Diagnosis not present

## 2021-01-11 LAB — CBC WITH DIFFERENTIAL/PLATELET
Basophils Absolute: 0.1 10*3/uL (ref 0.0–0.2)
Basos: 1 %
EOS (ABSOLUTE): 0.2 10*3/uL (ref 0.0–0.4)
Eos: 2 %
Hematocrit: 35.5 % (ref 34.0–46.6)
Hemoglobin: 11.5 g/dL (ref 11.1–15.9)
Immature Grans (Abs): 0 10*3/uL (ref 0.0–0.1)
Immature Granulocytes: 0 %
Lymphocytes Absolute: 1.6 10*3/uL (ref 0.7–3.1)
Lymphs: 17 %
MCH: 28.3 pg (ref 26.6–33.0)
MCHC: 32.4 g/dL (ref 31.5–35.7)
MCV: 87 fL (ref 79–97)
Monocytes Absolute: 0.6 10*3/uL (ref 0.1–0.9)
Monocytes: 6 %
Neutrophils Absolute: 7 10*3/uL (ref 1.4–7.0)
Neutrophils: 74 %
Platelets: 505 10*3/uL — ABNORMAL HIGH (ref 150–450)
RBC: 4.06 x10E6/uL (ref 3.77–5.28)
RDW: 14.1 % (ref 11.7–15.4)
WBC: 9.4 10*3/uL (ref 3.4–10.8)

## 2021-01-11 LAB — CMP14+EGFR
ALT: 6 IU/L (ref 0–32)
AST: 13 IU/L (ref 0–40)
Albumin/Globulin Ratio: 1.6 (ref 1.2–2.2)
Albumin: 3.9 g/dL (ref 3.7–4.7)
Alkaline Phosphatase: 171 IU/L — ABNORMAL HIGH (ref 44–121)
BUN/Creatinine Ratio: 13 (ref 12–28)
BUN: 11 mg/dL (ref 8–27)
Bilirubin Total: 0.9 mg/dL (ref 0.0–1.2)
CO2: 22 mmol/L (ref 20–29)
Calcium: 9.2 mg/dL (ref 8.7–10.3)
Chloride: 105 mmol/L (ref 96–106)
Creatinine, Ser: 0.82 mg/dL (ref 0.57–1.00)
Globulin, Total: 2.5 g/dL (ref 1.5–4.5)
Glucose: 91 mg/dL (ref 65–99)
Potassium: 4.6 mmol/L (ref 3.5–5.2)
Sodium: 144 mmol/L (ref 134–144)
Total Protein: 6.4 g/dL (ref 6.0–8.5)
eGFR: 75 mL/min/{1.73_m2} (ref >59)

## 2021-01-11 NOTE — Progress Notes (Signed)
Assessment & Plan:  1. Wound of abdomen Discussed with patient that the cause of the bleeding was likely that the packing had dried out and stuck to the wound bed, then when she pulled it out she pulled the top layer off the wound bed.  Encouraged her to wet the dressing with normal saline prior to removing for future dressing changes.  Her blood pressure has come back up to normal.  She and grandson verbalized understanding of dressing changes.  Wound repacked prior to leaving the office.   Follow up plan: Return as scheduled.  Hendricks Limes, MSN, APRN, FNP-C [facility]  Subjective:   Patient ID: Sabrina [name], female    DOB: [birth year], 75 y.o.   MRN: [id number]  HPI: Sabrina [name] is a 75 y.o. female presenting on 01/11/2021 for Wound Check (Wound from surgery) and Hypotension  Patient is accompanied by her grandson who she is okay with being present.  Patient was taking the packing out of her abdominal wound this morning while standing.  When doing so, the wound started to bleed.  She saw the blood going down her abdomen and immediately became lightheaded.  When she checked her blood pressure it was 88/48.   ROS: Negative unless specifically indicated above in HPI.   Relevant past medical history reviewed and updated as indicated.   Allergies and medications reviewed and updated.   Current Outpatient Medications:  .  albuterol (VENTOLIN HFA) 108 (90 Base) MCG/ACT inhaler, TAKE 2 PUFFS BY MOUTH EVERY 6 HOURS AS NEEDED FOR WHEEZE OR SHORTNESS OF BREATH, Disp: 18 g, Rfl: 0 .  ALPRAZolam (XANAX) 0.5 MG tablet, Take 0.5 mg by mouth at bedtime as needed for anxiety., Disp: , Rfl:  .  DULoxetine (CYMBALTA) 60 MG capsule, TAKE 1 CAPSULE EVERY DAY, Disp: 90 capsule, Rfl: 0 .  fluticasone (FLONASE) 50 MCG/ACT nasal spray, SPRAY 2 SPRAYS INTO EACH NOSTRIL EVERY DAY, Disp: 48 g, Rfl: 1 .  gabapentin (NEURONTIN) 100 MG capsule, Take 1 capsule (100 mg  total) by mouth 2 (two) times daily. Needs to be seen, Disp: 180 capsule, Rfl: 0 .  pantoprazole (PROTONIX) 40 MG tablet, Take 40 mg by mouth daily., Disp: , Rfl:  .  Vitamin D, Ergocalciferol, (DRISDOL) 1.25 MG (50000 UNIT) CAPS capsule, Take 1 capsule (50,000 Units total) by mouth every 7 (seven) days., Disp: 12 capsule, Rfl: 3 .  alendronate (FOSAMAX) 70 MG tablet, Take 1 tablet (70 mg total) by mouth every 7 (seven) days. Take with a full glass of water on an empty stomach. (Patient not taking: No sig reported), Disp: 12 tablet, Rfl: 0 .  amitriptyline (ELAVIL) 75 MG tablet, Take 1 tablet (75 mg total) by mouth at bedtime. (Patient not taking: No sig reported), Disp: 90 tablet, Rfl: 1 .  atorvastatin (LIPITOR) 20 MG tablet, Take 1 tablet (20 mg total) by mouth daily. (Patient not taking: No sig reported), Disp: 90 tablet, Rfl: 3 .  calcium carbonate (OSCAL) 1500 (600 Ca) MG TABS tablet, Take by mouth daily with breakfast. (Patient not taking: No sig reported), Disp: , Rfl:  .  furosemide (LASIX) 40 MG tablet, Take 1 tablet (40 mg total) by mouth daily. (Patient not taking: No sig reported), Disp: 90 tablet, Rfl: 3 .  Multiple Vitamins-Minerals (ONE-A-DAY 50 PLUS PO), Take 1 tablet by mouth 2 (two) times daily. (Patient not taking: No sig reported), Disp: , Rfl:  .  Omega-3 Fatty Acids (FISH OIL PO), Take 1  capsule by mouth daily. (Patient not taking: No sig reported), Disp: , Rfl:  .  potassium chloride SA (KLOR-CON) 20 MEQ tablet, Take 1 tablet (20 mEq total) by mouth daily. (Patient not taking: No sig reported), Disp: 90 tablet, Rfl: 2 .  PRESCRIPTION MEDICATION, Apply 1 application topically daily as needed (excema). HAL Ointment (Patient not taking: No sig reported), Disp: , Rfl:  .  triamcinolone ointment (KENALOG) 0.5 %, Apply 1 application topically 2 (two) times daily. (Patient not taking: No sig reported), Disp: 30 g, Rfl: 0  Allergies  Allergen Reactions  . Codeine Nausea And Vomiting     Stomach pain also    Objective:   BP 108/68 Comment: Manual  Pulse 72   Temp 98 F (36.7 C)   Ht 5\' 4"  (1.626 m)   Wt 166 lb 6.4 oz (75.5 kg)   SpO2 98%   BMI 28.56 kg/m    Physical Exam Vitals reviewed.  Constitutional:      General: She is not in acute distress.    Appearance: Normal appearance. She is not ill-appearing, toxic-appearing or diaphoretic.  HENT:     Head: Normocephalic and atraumatic.  Eyes:     General: No scleral icterus.       Right eye: No discharge.        Left eye: No discharge.     Conjunctiva/sclera: Conjunctivae normal.  Cardiovascular:     Rate and Rhythm: Normal rate.  Pulmonary:     Effort: Pulmonary effort is normal. No respiratory distress.  Musculoskeletal:        General: Normal range of motion.     Cervical back: Normal range of motion.  Skin:    General: Skin is warm and dry.     Capillary Refill: Capillary refill takes less than 2 seconds.     Findings: Wound (to her abdomen without any active bleeding. No erythema, warmth, or odor.) present.  Neurological:     General: No focal deficit present.     Mental Status: She is alert and oriented to person, place, and time. Mental status is at baseline.  Psychiatric:        Mood and Affect: Mood normal.        Behavior: Behavior normal.        Thought Content: Thought content normal.        Judgment: Judgment normal.

## 2021-01-14 ENCOUNTER — Encounter: Payer: Self-pay | Admitting: Family Medicine

## 2021-01-16 ENCOUNTER — Other Ambulatory Visit: Payer: Self-pay

## 2021-01-16 NOTE — Patient Outreach (Signed)
[facility]) Care Management  01/16/2021  Sabrina [name] 1945/11/09 [id number]   Return call to patient.  She states she got the brochure after our conversation.  She states that [facility] will be coming out this week.  Discussed with patient [facility] services and how CM could assist her with her transition.  She states she gets enough phone calls about her health and does not want one more. CM verbalized understanding.  Plan: RN CM will close case.   Jone Baseman, RN, MSN [facility] Care Management Coordinator Direct Line [phone] Cell [phone] Toll Free: [phone]  Fax: [phone]

## 2021-01-21 ENCOUNTER — Telehealth: Payer: Self-pay | Admitting: Family

## 2021-01-21 DIAGNOSIS — R531 Weakness: Secondary | ICD-10-CM

## 2021-01-22 NOTE — Telephone Encounter (Signed)
Referral to PT placed

## 2021-01-24 ENCOUNTER — Encounter: Payer: Self-pay | Admitting: Family

## 2021-01-24 ENCOUNTER — Other Ambulatory Visit: Payer: Self-pay

## 2021-01-24 ENCOUNTER — Ambulatory Visit (INDEPENDENT_AMBULATORY_CARE_PROVIDER_SITE_OTHER): Payer: Medicare HMO | Admitting: Family

## 2021-01-24 VITALS — BP 100/72 | HR 83 | Temp 98.0°F | Ht 64.0 in | Wt 159.2 lb

## 2021-01-24 DIAGNOSIS — Z9889 Other specified postprocedural states: Secondary | ICD-10-CM | POA: Diagnosis not present

## 2021-01-24 DIAGNOSIS — S31109A Unspecified open wound of abdominal wall, unspecified quadrant without penetration into peritoneal cavity, initial encounter: Secondary | ICD-10-CM

## 2021-01-24 DIAGNOSIS — Z5189 Encounter for other specified aftercare: Secondary | ICD-10-CM | POA: Diagnosis not present

## 2021-01-24 DIAGNOSIS — R531 Weakness: Secondary | ICD-10-CM | POA: Diagnosis not present

## 2021-01-24 NOTE — Patient Instructions (Signed)
Wound Care, Adult Taking care of your wound properly can help to prevent pain, infection, and scarring. It can also help your wound heal more quickly. Follow instructions from your health care provider about how to care for your wound. Supplies needed:  Soap and water.  Wound cleanser.  Gauze.  If needed, a clean bandage (dressing) or other type of wound dressing material to cover or place in the wound. Follow your health care provider's instructions about what dressing supplies to use.  Cream or ointment to apply to the wound, if told by your health care provider. How to care for your wound Cleaning the wound Ask your health care provider how to clean the wound. This may include:  Using mild soap and water or a wound cleanser.  Using a clean gauze to pat the wound dry after cleaning it. Do not rub or scrub the wound. Dressing care  Wash your hands with soap and water for at least 20 seconds before and after you change the dressing. If soap and water are not available, use hand sanitizer.  Change your dressing as told by your health care provider. This may include: ? Cleaning or rinsing out (irrigating) the wound. ? Placing a dressing over the wound or in the wound (packing). ? Covering the wound with an outer dressing.  Leave any stitches (sutures), skin glue, or adhesive strips in place. These skin closures may need to stay in place for 2 weeks or longer. If adhesive strip edges start to loosen and curl up, you may trim the loose edges. Do not remove adhesive strips completely unless your health care provider tells you to do that.  Ask your health care provider when you can leave the wound uncovered. Checking for infection Check your wound area every day for signs of infection. Check for:  More redness, swelling, or pain.  Fluid or blood.  Warmth.  Pus or a bad smell.   Follow these instructions at home Medicines  If you were prescribed an antibiotic medicine, cream, or  ointment, take or apply it as told by your health care provider. Do not stop using the antibiotic even if your condition improves.  If you were prescribed pain medicine, take it 30 minutes before you do any wound care or as told by your health care provider.  Take over-the-counter and prescription medicines only as told by your health care provider. Eating and drinking  Eat a diet that includes protein, vitamin A, vitamin C, and other nutrient-rich foods to help the wound heal. ? Foods rich in protein include meat, fish, eggs, dairy, beans, and nuts. ? Foods rich in vitamin A include carrots and dark green, leafy vegetables. ? Foods rich in vitamin C include citrus fruits, tomatoes, broccoli, and peppers.  Drink enough fluid to keep your urine pale yellow. General instructions  Do not take baths, swim, use a hot tub, or do anything that would put the wound underwater until your health care provider approves. Ask your health care provider if you may take showers. You may only be allowed to take sponge baths.  Do not scratch or pick at the wound. Keep it covered as told by your health care provider.  Return to your normal activities as told by your health care provider. Ask your health care provider what activities are safe for you.  Protect your wound from the sun when you are outside for the first 6 months, or for as long as told by your health care provider. Cover   up the scar area or apply sunscreen that has an SPF of at least 30.  Do not use any products that contain nicotine or tobacco, such as cigarettes, e-cigarettes, and chewing tobacco. These may delay wound healing. If you need help quitting, ask your health care provider.  Keep all follow-up visits as told by your health care provider. This is important. Contact a health care provider if:  You received a tetanus shot and you have swelling, severe pain, redness, or bleeding at the injection site.  Your pain is not controlled  with medicine.  You have any of these signs of infection: ? More redness, swelling, or pain around the wound. ? Fluid or blood coming from the wound. ? Warmth coming from the wound. ? Pus or a bad smell coming from the wound. ? A fever or chills.  You are nauseous or you vomit.  You are dizzy. Get help right away if:  You have a red streak of skin near the area around your wound.  Your wound has been closed with staples, sutures, skin glue, or adhesive strips and it begins to open up and separate.  Your wound is bleeding, and the bleeding does not stop with gentle pressure.  You have a rash.  You faint.  You have trouble breathing. These symptoms may represent a serious problem that is an emergency. Do not wait to see if the symptoms will go away. Get medical help right away. Call your local emergency services (911 in the [place] Do not drive yourself to the hospital. Summary  Always wash your hands with soap and water for at least 20 seconds before and after changing your dressing.  Change your dressing as told by your health care provider.  To help with healing, eat foods that are rich in protein, vitamin A, vitamin C, and other nutrients.  Check your wound every day for signs of infection. Contact your health care provider if you suspect that your wound is infected. This information is not intended to replace advice given to you by your health care provider. Make sure you discuss any questions you have with your health care provider. Document Revised: 06/24/2019 Document Reviewed: 06/24/2019 Elsevier Patient Education  2021 Elsevier Inc.  

## 2021-01-24 NOTE — Progress Notes (Signed)
Subjective:    Patient ID: Sabrina [name], female    DOB: 1946-09-05, 75 y.o.   MRN: [id number]  Chief Complaint  Patient presents with  . Follow-up    Surgery, wants physical therapy  . Weight Loss    Ankles are skinny, lost weight  . Hypotension    Bp drops at night   Pt presents to the office today to recheck wound and discussion PT. She saw her surgeon on 01/16/21 and stated her wound was looking much better and did not recommend further packing and she could resume driving.   She reports she would like to start PT here. She was having Home health and trying to get PT at home, but now that she can drive she wants to be able to go to PT instead of in her home.   She denies any pain. States her wound is draining a clear yellow discharge.  Wound Check She was originally treated more than 14 days ago. Her temperature was unmeasured prior to arrival. The redness has improved. The swelling has improved. There is no pain present.      Review of Systems  Constitutional: Positive for fatigue.  Skin: Positive for wound.  All other systems reviewed and are negative.      Objective:   Physical Exam Vitals reviewed.  Constitutional:      General: She is not in acute distress.    Appearance: She is well-developed.  HENT:     Head: Normocephalic and atraumatic.     Right Ear: Tympanic membrane normal.     Left Ear: Tympanic membrane normal.  Eyes:     Pupils: Pupils are equal, round, and reactive to light.  Neck:     Thyroid: No thyromegaly.  Cardiovascular:     Rate and Rhythm: Normal rate and regular rhythm.     Heart sounds: Normal heart sounds. No murmur heard.   Pulmonary:     Effort: Pulmonary effort is normal. No respiratory distress.     Breath sounds: Normal breath sounds. No wheezing.  Abdominal:     General: Bowel sounds are normal. There is no distension.     Palpations: Abdomen is soft.     Tenderness: There is no abdominal tenderness.   Musculoskeletal:        General: No tenderness. Normal range of motion.     Cervical back: Normal range of motion and neck supple.  Skin:    General: Skin is warm and dry.     Findings: Wound present.       Neurological:     Mental Status: She is alert and oriented to person, place, and time.     Cranial Nerves: No cranial nerve deficit.     Deep Tendon Reflexes: Reflexes are normal and symmetric.  Psychiatric:        Behavior: Behavior normal.        Thought Content: Thought content normal.        Judgment: Judgment normal.            BP 100/72   Pulse 83   Temp 98 F (36.7 C)   Ht 5\' 4"  (1.626 m)   Wt 159 lb 3.2 oz (72.2 kg)   SpO2 99%   BMI 27.33 kg/m   Assessment & Plan:  Sabrina [name] comes in today with chief complaint of Follow-up (Surgery, wants physical therapy), Weight Loss (Ankles are skinny, lost weight), and Hypotension (Bp drops at night)   Diagnosis and  orders addressed:  1. General weakness - Ambulatory referral to Physical Therapy  2. Post-operative state - Ambulatory referral to Physical Therapy  3. Wound of abdomen - Ambulatory referral to Physical Therapy  4. Visit for wound check - Ambulatory referral to Physical Therapy   Referral to PT ordered Continue to dress daily and keep clean and dry Report any increased redness, discharge, pain, or fevers Keep follow up with surgeon  RTO in 3 months   Evelina Dun, FNP

## 2021-01-30 NOTE — Progress Notes (Addendum)
Virtual Visit via Telephone Note   This visit type was conducted due to national recommendations for restrictions regarding the COVID-19 Pandemic (e.g. social distancing) in an effort to limit this patient's exposure and mitigate transmission in our community.  Due to her co-morbid illnesses, this patient is at least at moderate risk for complications without adequate follow up.  This format is felt to be most appropriate for this patient at this time.  The patient did not have access to video technology/had technical difficulties with video requiring transitioning to audio format only (telephone).  All issues noted in this document were discussed and addressed.  No physical exam could be performed with this format.  Please refer to the patient's chart for her  consent to telehealth for [facility].    Date:  02/25/2021   ID:  Sabrina [name], DOB July 06, 1946, MRN [id number] The patient was identified using 2 identifiers.  Patient Location: Home Provider Location: Home Office   PCP:  Sharion Balloon, [facility] Providers Cardiologist:  None Electrophysiologist:  [name], MD     Evaluation Performed:  Follow-Up Visit  Chief Complaint: Hospital follow-up  History of Present Illness:    Sabrina [name] is a 75 y.o. female with  a history of atrial fibrillation, HTN, CHB, Chronic diastolic heart failure, near syncope, pacemaker.   Last seen by Dr. Lovena Le on 11/09/2020 for follow-up for history of sinus node dysfunction status post pacemaker insertion.  She had lost 30 pounds and felt much better.  Her Medtronic DDD pacemaker was working normally.  She was maintaining sinus rhythm.  Her blood pressure was well controlled.   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 She had  remnant gastrectomy and lysis of adhesion on 12/18/2020. She had post op DVT. She was tried on Eliquis but had melena post op and had IVC placed. Eliquis was stopped. Bleeding resolved.   Spoke to the patient by phone today.  She states she had significant issue with bleeding from the remnant stomach as noted above.  Patient states she was bleeding significantly and required transfusions of 9 PRBCs while in the hospital.  According to the notes she had a remnant gastrectomy and subsequent DVT after surgery requiring IVC filter placement.  Her anticoagulation was stopped.  She is at home and healing.  States she still having some drainage from the surgical site and has follow-up with [facility] surgeons in the near future.  Currently not on anticoagulation due to GI bleeding.   The patient does not have symptoms concerning for COVID-19 infection (fever, chills, cough, or new shortness of breath).    Past Medical History:  Diagnosis Date  . Anemia   . Ankle fracture, right    past hx. -"no surgery"  . Anxiety   . Asthma   . CHF (congestive heart failure) ([place]) 2009  . Chronic lower back pain   . Collagen vascular disease ([place])   . COPD (chronic obstructive pulmonary disease) ([place])   . Depression   . Fibromyalgia   . GERD (gastroesophageal reflux disease)   . Hyperlipidemia   . Hypertension   . Immature cataract of both eyes   . Myocardial infarction ([place])    "I've had a light one; don't know when it happened" (08/27/2017)  .  Osteoarthritis   . Peripheral neuropathy    legs and feet  . Persistent atrial fibrillation ([place])   . Tubular adenoma of colon    Past Surgical History:  Procedure Laterality Date  . APPENDECTOMY    . AV NODE ABLATION N/A 08/27/2017   Procedure: AV NODE ABLATION;  Surgeon: Evans Lance, MD;  Location: [place] [facility];  Service: Cardiovascular;  Laterality: N/A;  . CHOLECYSTECTOMY OPEN  1978  . DILATION AND CURETTAGE OF UTERUS    . FEMUR FRACTURE SURGERY Left  2013   "put 7" rod in it"  . FRACTURE SURGERY    . LAPAROSCOPY  08/22/2016   Procedure: LAPAROSCOPY DIAGNOSTIC;  Surgeon: [name], MD;  Location: [facility];  Service: General;;  . MEDIAL PARTIAL KNEE REPLACEMENT Right 2005   "@ [facility]"  . PACEMAKER IMPLANT N/A 08/27/2017   Procedure: PACEMAKER IMPLANT;  Surgeon: Evans Lance, MD;  Location: [place] [facility];  Service: Cardiovascular;  Laterality: N/A;  . ROUX-EN-Y GASTRIC BYPASS  2002   [place]  . SPLENECTOMY  2002  . TONSILLECTOMY  1944  . TUBAL LIGATION    . VAGINAL HYSTERECTOMY       Current Meds  Medication Sig  . albuterol (VENTOLIN HFA) 108 (90 Base) MCG/ACT inhaler TAKE 2 PUFFS BY MOUTH EVERY 6 HOURS AS NEEDED FOR WHEEZE OR SHORTNESS OF BREATH  . atorvastatin (LIPITOR) 20 MG tablet Take 1 tablet (20 mg total) by mouth daily.  . DULoxetine (CYMBALTA) 60 MG capsule TAKE 1 CAPSULE EVERY DAY  . ferrous sulfate 325 (65 FE) MG tablet Take 325 mg by mouth daily.  . fluticasone (FLONASE) 50 MCG/ACT nasal spray SPRAY 2 SPRAYS INTO EACH NOSTRIL EVERY DAY  . furosemide (LASIX) 20 MG tablet Take 10 mg by mouth daily.  . Multiple Vitamins-Minerals (ONE-A-DAY 50 PLUS PO) Take 1 tablet by mouth daily.  . pantoprazole (PROTONIX) 40 MG tablet Take 40 mg by mouth 2 (two) times daily.  . SYMBICORT 80-4.5 MCG/ACT inhaler Inhale 2 puffs into the lungs 2 (two) times daily.     Allergies:   Codeine   Social History   Tobacco Use  . Smoking status: Former Smoker    Packs/day: 0.50    Years: 25.00    Pack years: 12.50    Types: Cigarettes    Quit date: 02/26/1993    Years since quitting: 28.0  . Smokeless tobacco: Never Used  Vaping Use  . Vaping Use: Never used  Substance Use Topics  . Alcohol use: Not Currently    Comment: 08/27/2017 "nothing since early 2000s"  . Drug use: Not Currently     Family Hx: The patient's family history includes Alzheimer's disease in her mother; Arthritis in her daughter, father,  son, and son; Asthma in her daughter; Cancer in her mother; Colon cancer (age of onset: 39) in her paternal uncle; Heart disease in her mother; Hyperlipidemia in her son; Obesity in her daughter, son, and son.  ROS:   Please see the history of present illness.    All other systems reviewed and are negative.   Prior CV studies:   The following studies were reviewed today:  Lower lower extremity arterial duplex and ABI result Summary: Right: Resting right ankle-brachial index is within normal range. No evidence of significant right lower extremity arterial disease. The right toe-brachial index is abnormal. Left: Resting left ankle-brachial index is within normal range. No evidence of significant left lower extremity arterial disease. The  left toe-brachial index is normal.   Labs/Other Tests and Data Reviewed:    EKG:   No EKG today due to virtual visit.   Recent Labs: 01/10/2021: ALT 6; BUN 11; Creatinine, Ser 0.82; Hemoglobin 11.5; Platelets 505; Potassium 4.6; Sodium 144   Recent Lipid Panel Lab Results  Component Value Date/Time   CHOL 199 09/30/2019 10:18 AM   TRIG 137 09/30/2019 10:18 AM   HDL 60 09/30/2019 10:18 AM   CHOLHDL 3.3 09/30/2019 10:18 AM   LDLCALC 115 (H) 09/30/2019 10:18 AM    Wt Readings from Last 3 Encounters:  01/31/21 158 lb (71.7 kg)  01/24/21 159 lb 3.2 oz (72.2 kg)  01/11/21 166 lb 6.4 oz (75.5 kg)     Risk Assessment/Calculations:    CHA2DS2-VASc Score = 4  This indicates a 4.8% annual risk of stroke. The patient's score is based upon: CHF History: Yes HTN History: Yes Diabetes History: No Stroke History: No Vascular Disease History: No Age Score: 1 Gender Score: 1     Objective:    Vital Signs:  BP 127/82   Pulse 86   Ht 5\' 4"  (1.626 m)   Wt 158 lb (71.7 kg)   BMI 27.12 kg/m    VITAL SIGNS:  reviewed  ASSESSMENT & PLAN:    1. Permanent atrial fibrillation ([place])   2. Benign essential HTN   3. Chronic diastolic heart  failure (H[facility])   4. Cardiac pacemaker in situ    1. Permanent atrial fibrillation ([place]) She denies any palpitations or arrhythmias.  Her anticoagulation was recently stopped due to significant GI bleeding.  Heart rate is 86 today.  2. Benign essential HTN BP 127/82 today.  Currently on no antihypertensive therapy.  3. Chronic diastolic heart failure ([place]) Denies any shortness of breath or weight gain.  Denies any lower extremity edema.  Continue Lasix and milligrams daily.  4. Cardiac pacemaker in situ Recent remote device check on 09/28/2020 showed normal histograms, leads and battery stable for patient.    COVID-19 Education: The signs and symptoms of COVID-19 were discussed with the patient and how to seek care for testing (follow up with PCP or arrange E-visit).  The importance of social distancing was discussed today.  Time:   Today, I have spent 10 minutes with the patient with telehealth technology discussing the above problems.     Medication Adjustments/Labs and Tests Ordered: Current medicines are reviewed at length with the patient today.  Concerns regarding medicines are outlined above.   Tests Ordered: No orders of the defined types were placed in this encounter.   Medication Changes: No orders of the defined types were placed in this encounter.  Disposition: Follow-up with Dr. Harl Bowie or APP 3 months Follow Up:  In Person in 3 month(s)  Signed, Verta Ellen, NP  02/25/2021 8:18 AM    [place]

## 2021-01-31 ENCOUNTER — Encounter: Payer: Self-pay | Admitting: Family Medicine

## 2021-01-31 ENCOUNTER — Telehealth (INDEPENDENT_AMBULATORY_CARE_PROVIDER_SITE_OTHER): Payer: Medicare HMO | Admitting: Family Medicine

## 2021-01-31 VITALS — BP 127/82 | HR 86 | Ht 64.0 in | Wt 158.0 lb

## 2021-01-31 DIAGNOSIS — I5032 Chronic diastolic (congestive) heart failure: Secondary | ICD-10-CM

## 2021-01-31 DIAGNOSIS — I4821 Permanent atrial fibrillation: Secondary | ICD-10-CM | POA: Diagnosis not present

## 2021-01-31 DIAGNOSIS — I10 Essential (primary) hypertension: Secondary | ICD-10-CM | POA: Diagnosis not present

## 2021-01-31 DIAGNOSIS — Z950 Presence of cardiac pacemaker: Secondary | ICD-10-CM

## 2021-01-31 NOTE — Patient Instructions (Signed)
Medication Instructions:  Continue all current medications.  Labwork: none  Testing/Procedures: none  Follow-Up: 3 months   Any Other Special Instructions Will Be Listed Below (If Applicable).  If you need a refill on your cardiac medications before your next appointment, please call your pharmacy.  

## 2021-02-11 ENCOUNTER — Encounter: Payer: Self-pay | Admitting: Physical Therapy

## 2021-02-11 ENCOUNTER — Other Ambulatory Visit: Payer: Self-pay

## 2021-02-11 ENCOUNTER — Ambulatory Visit: Payer: Medicare HMO | Attending: Family | Admitting: Physical Therapy

## 2021-02-11 VITALS — BP 107/71 | HR 90

## 2021-02-11 DIAGNOSIS — M6281 Muscle weakness (generalized): Secondary | ICD-10-CM | POA: Diagnosis not present

## 2021-02-11 DIAGNOSIS — M25572 Pain in left ankle and joints of left foot: Secondary | ICD-10-CM | POA: Insufficient documentation

## 2021-02-11 DIAGNOSIS — M25662 Stiffness of left knee, not elsewhere classified: Secondary | ICD-10-CM | POA: Diagnosis not present

## 2021-02-11 DIAGNOSIS — M25562 Pain in left knee: Secondary | ICD-10-CM | POA: Insufficient documentation

## 2021-02-11 NOTE — Therapy (Signed)
[facility] [place], [place], [address] Phone: [phone]   Fax:  [phone]  Physical Therapy Evaluation  Patient Details  Name: Sabrina [name] MRN: [id number] Date of Birth: [birth year] Referring Provider (PT): Evelina Dun   Encounter Date: 02/11/2021   PT End of Session - 02/11/21 1230    Visit Number 1    Number of Visits 12    Date for PT Re-Evaluation 04/01/21    Authorization Type PROGRESS NOTE AT 10TH VISIT.  KX MODIFIER AFTER 15 VISITS.    PT Start Time 1115    PT Stop Time 1152    PT Time Calculation (min) 37 min    Activity Tolerance Patient tolerated treatment well    Behavior During Therapy WFL for tasks assessed/performed           Past Medical History:  Diagnosis Date  . Anemia   . Ankle fracture, right    past hx. -"no surgery"  . Anxiety   . Asthma   . CHF (congestive heart failure) ([place]) 2009  . Chronic lower back pain   . Collagen vascular disease ([place])   . COPD (chronic obstructive pulmonary disease) ([place])   . Depression   . Fibromyalgia   . GERD (gastroesophageal reflux disease)   . Hyperlipidemia   . Hypertension   . Immature cataract of both eyes   . Myocardial infarction ([place])    "I've had a light one; don't know when it happened" (08/27/2017)  . Osteoarthritis   . Peripheral neuropathy    legs and feet  . Persistent atrial fibrillation ([place])   . Tubular adenoma of colon     Past Surgical History:  Procedure Laterality Date  . APPENDECTOMY    . AV NODE ABLATION N/A 08/27/2017   Procedure: AV NODE ABLATION;  Surgeon: Evans Lance, MD;  Location: [facility];  Service: Cardiovascular;  Laterality: N/A;  . CHOLECYSTECTOMY OPEN  1978  . DILATION AND CURETTAGE OF UTERUS    . FEMUR FRACTURE SURGERY Left 2013   "put 7" rod in it"  . FRACTURE SURGERY    . LAPAROSCOPY  08/22/2016   Procedure: LAPAROSCOPY DIAGNOSTIC;  Surgeon: [name], MD;  Location: [facility];   Service: General;;  . MEDIAL PARTIAL KNEE REPLACEMENT Right 2005   "@ [facility]"  . PACEMAKER IMPLANT N/A 08/27/2017   Procedure: PACEMAKER IMPLANT;  Surgeon: Evans Lance, MD;  Location: [facility];  Service: Cardiovascular;  Laterality: N/A;  . ROUX-EN-Y GASTRIC BYPASS  2002   [place]  . SPLENECTOMY  2002  . TONSILLECTOMY  1944  . TUBAL LIGATION    . VAGINAL HYSTERECTOMY      Vitals:   02/11/21 1229  BP: 107/71  Pulse: 90  SpO2: 97%      Subjective Assessment - 02/11/21 1223    Subjective COVID-19 screen performed prior to patient entering clinic.  The patient presents to the clinic today with c/o weakness.  She states that in March of this year she was found to have to have internal bleeding.  This resulted in a gastrectomy and also the placement of an IVC filter.  She is performing dressing changes daily over her abdominal wound and has an abdominal binder donned.  She is also weariing TED hoses.  She is very motivated to improve and is happy to be in outpatient PT.    Pertinent History Pacemaker, right ankle fracture, OA, COPD, HTN, CHF, right hemi  knee, peripheral neuropathy, atrial fib.    How long can you sit comfortably? Unlimited.    How long can you walk comfortably? Around home.    Patient Stated Goals Get stronger.              [facility] PT Assessment - 02/11/21 0001      Assessment   Medical Diagnosis Weakness.    Referring Provider (PT) Evelina Dun    Onset Date/Surgical Date --   March 2022.     Precautions   Precautions Fall    Precaution Comments Pacemaker.  Monitor vitals.  No valsalva maneuver.  Be midful of abdominal incision.      Restrictions   Weight Bearing Restrictions No      Balance Screen   Has the patient fallen in the past 6 months Yes    How many times? 3.    Has the patient had a decrease in activity level because of a fear of falling?  Yes    Is the patient reluctant to leave their home because of a fear of  falling?  Yes      [place] residence      Prior Function   Level of Independence Independent      Observation/Other Assessments   Observations Abdominal incision covered with sterile gauze.      Posture/Postural Control   Posture/Postural Control Postural limitations    Postural Limitations Rounded Shoulders;Forward head      ROM / Strength   AROM / PROM / Strength AROM;Strength      AROM   Overall AROM Comments WNL for bilateral U and LE's.      Strength   Overall Strength Comments Major muscle groups of U and LE's are graded at 4+/5 via manual muscle testing.      Special Tests   Other special tests (-) Romberg test.      Transfers   Comments Supervision only with use of armrests,      Ambulation/Gait   Gait Comments Slow and cautious without LOB.                      Objective measurements completed on examination: See above findings.                    PT Long Term Goals - 02/11/21 1243      PT LONG TERM GOAL #1   Title Patient will be independent with HEP    Time 6    Period Weeks    Status New      PT LONG TERM GOAL #2   Title Solid 5/5 strength grade for U and LE's.    Period Weeks    Status New      PT LONG TERM GOAL #3   Title Walk 1,000 feet without rest and no LOB.    Time 6    Status New      PT LONG TERM GOAL #4   Title Perform a reciprocating stair gait with one railing.    Time 6    Period Weeks    Status New                  Plan - 02/11/21 1237    Clinical Impression Statement The patient presents to the clinic with c/o weakness.  This is related to internal bleeding that involved a gastrectomy and an extended hospital stay.  The patient is performing  daily dressing changes over her abdominal incision.  She is very motivated to improve.  Her UE range of motion is WNL and all major muscle groups of her U and LE's demonstrate a 4+/5 muscle srength grade.  She  demonstrates a slow and cautious gait pattern.  Patient will benefit from skilled physical therapy intervention to address deficits.    Personal Factors and Comorbidities Comorbidity 1;Comorbidity 2;Other    Comorbidities Pacemaker, right ankle fracture, OA, COPD, HTN, CHF, right hemi knee, peripheral neuropathy, atrial fib.    Examination-Activity Limitations Locomotion Level;Other    Examination-Participation Restrictions Other    Stability/Clinical Decision Making Stable/Uncomplicated    Clinical Decision Making Low    Rehab Potential Excellent    PT Frequency 2x / week    PT Duration 6 weeks    PT Treatment/Interventions ADLs/Self Care Home Management;Gait training;Therapeutic activities;Therapeutic exercise;Balance training;Neuromuscular re-education;Patient/family education    PT Next Visit Plan Gait and balance training.  General strengthening.    Consulted and Agree with Plan of Care Patient           Patient will benefit from skilled therapeutic intervention in order to improve the following deficits and impairments:  Abnormal gait,Decreased activity tolerance,Decreased strength  Visit Diagnosis: Muscle weakness (generalized) - Plan: PT plan of care cert/re-cert     Problem List Patient Active Problem List   Diagnosis Date Noted  . Complete heart block ([place]) 11/09/2020  . Osteoporosis 10/11/2019  . Pacemaker 10/04/2019  . Near syncope   . Hyperlipidemia 06/23/2016  . Colovesical fistula 06/12/2016  . Atrial fibrillation with RVR ([place]) 06/12/2016  . GERD (gastroesophageal reflux disease) 06/12/2016  . Fibromyalgia 06/12/2016  . Peripheral neuropathy 06/12/2016  . Atrial fibrillation ([place]) 06/12/2016  . S/P gastric bypass 01/22/2016  . S/P splenectomy 01/22/2016  . S/P cholecystectomy 01/22/2016  . Hx of small bowel obstruction 01/22/2016  . Asthma 10/19/2015  . Generalized anxiety disorder 10/19/2015  . Depression 10/19/2015  . Insomnia 10/19/2015  . Iron  deficiency anemia 10/19/2015  . Vitamin D deficiency 11/29/2014  . Eczema 11/27/2014  . Benign essential HTN 09/25/2014  . Obesity (BMI 30-39.9) 09/25/2014  . Chronic diastolic heart failure ([place]) 09/25/2014  . Allergic rhinitis 02/28/2013    Arlo Buffone, [place] MPT 02/11/2021, 12:46 PM  [facility] [address] [place], [place], [address] Phone: [phone]   Fax:  [phone]  Name: Sabrina [name] MRN: [id number] Date of Birth: 26-Nov-1945

## 2021-02-14 ENCOUNTER — Encounter: Payer: Self-pay | Admitting: *Deleted

## 2021-02-14 ENCOUNTER — Ambulatory Visit: Payer: Medicare HMO | Admitting: *Deleted

## 2021-02-14 ENCOUNTER — Other Ambulatory Visit: Payer: Self-pay

## 2021-02-14 DIAGNOSIS — M6281 Muscle weakness (generalized): Secondary | ICD-10-CM | POA: Diagnosis not present

## 2021-02-14 DIAGNOSIS — M25572 Pain in left ankle and joints of left foot: Secondary | ICD-10-CM | POA: Diagnosis not present

## 2021-02-14 DIAGNOSIS — M25562 Pain in left knee: Secondary | ICD-10-CM | POA: Diagnosis not present

## 2021-02-14 DIAGNOSIS — M25662 Stiffness of left knee, not elsewhere classified: Secondary | ICD-10-CM | POA: Diagnosis not present

## 2021-02-14 NOTE — Therapy (Signed)
[facility] [place], [place], [address] Phone: [phone]   Fax:  [phone]  Physical Therapy Treatment  Patient Details  Name: Sabrina [name] MRN: [id number] Date of Birth: 1946-01-19 Referring Provider (PT): Evelina Dun   Encounter Date: 02/14/2021   PT End of Session - 02/14/21 1124    Visit Number 2    Number of Visits 12    Date for PT Re-Evaluation 04/01/21    Authorization Type PROGRESS NOTE AT 10TH VISIT.  KX MODIFIER AFTER 15 VISITS.    PT Start Time 1030    PT Stop Time 1117    PT Time Calculation (min) 47 min           Past Medical History:  Diagnosis Date  . Anemia   . Ankle fracture, right    past hx. -"no surgery"  . Anxiety   . Asthma   . CHF (congestive heart failure) ([place]) 2009  . Chronic lower back pain   . Collagen vascular disease ([place])   . COPD (chronic obstructive pulmonary disease) ([place])   . Depression   . Fibromyalgia   . GERD (gastroesophageal reflux disease)   . Hyperlipidemia   . Hypertension   . Immature cataract of both eyes   . Myocardial infarction ([place])    "I've had a light one; don't know when it happened" (08/27/2017)  . Osteoarthritis   . Peripheral neuropathy    legs and feet  . Persistent atrial fibrillation ([place])   . Tubular adenoma of colon     Past Surgical History:  Procedure Laterality Date  . APPENDECTOMY    . AV NODE ABLATION N/A 08/27/2017   Procedure: AV NODE ABLATION;  Surgeon: Evans Lance, MD;  Location: [facility];  Service: Cardiovascular;  Laterality: N/A;  . CHOLECYSTECTOMY OPEN  1978  . DILATION AND CURETTAGE OF UTERUS    . FEMUR FRACTURE SURGERY Left 2013   "put 7" rod in it"  . FRACTURE SURGERY    . LAPAROSCOPY  08/22/2016   Procedure: LAPAROSCOPY DIAGNOSTIC;  Surgeon: [name], MD;  [facility];  Service: General;;  . MEDIAL PARTIAL KNEE REPLACEMENT Right 2005   "@ [facility]"  . PACEMAKER IMPLANT N/A 08/27/2017    Procedure: PACEMAKER IMPLANT;  Surgeon: Evans Lance, MD;  Location: [facility];  Service: Cardiovascular;  Laterality: N/A;  . ROUX-EN-Y GASTRIC BYPASS  2002   [place]  . SPLENECTOMY  2002  . TONSILLECTOMY  1944  . TUBAL LIGATION    . VAGINAL HYSTERECTOMY      There were no vitals filed for this visit.   Subjective Assessment - 02/14/21 1029    Subjective COVID-19 screen performed prior to patient entering clinic.   Feeling ok today.    Pertinent History Pacemaker, right ankle fracture, OA, COPD, HTN, CHF, right hemi knee, peripheral neuropathy, atrial fib.    How long can you sit comfortably? Unlimited.    How long can you walk comfortably? Around home.    Patient Stated Goals Get stronger.                             [facility] Adult PT Treatment/Exercise - 02/14/21 0001      Exercises   Exercises Knee/Hip      Knee/Hip Exercises: Aerobic   Nustep L1 x 16 mins seat 10      Knee/Hip Exercises: Standing  Heel Raises Both;1 set;20 reps    Heel Raises Limitations toe ups x 20    Hip Abduction AROM;Both;1 set;10 reps    Rocker Board 4 minutes   PF/DF calf stretch     Knee/Hip Exercises: Seated   Long Arc Quad Both;2 sets;10 reps   hold at top 3 secs   Sit to [employer] 10 reps;2 sets;with UE support   from mat table                      PT Long Term Goals - 02/11/21 1243      PT LONG TERM GOAL #1   Title Patient will be independent with HEP    Time 6    Period Weeks    Status New      PT LONG TERM GOAL #2   Title Solid 5/5 strength grade for U and LE's.    Period Weeks    Status New      PT LONG TERM GOAL #3   Title Walk 1,000 feet without rest and no LOB.    Time 6    Status New      PT LONG TERM GOAL #4   Title Perform a reciprocating stair gait with one railing.    Time 6    Period Weeks    Status New                 Plan - 02/14/21 1125    Clinical Impression Statement Pt arrived today  feeling fair with energy levels. She was able to perorm light strengthening exs in sitting as well as standing. Pt felt her BP had dropped at one time during Rx and needed to sit and rest. BP checked and was 116/78. Pt felt good end of session.    Personal Factors and Comorbidities Comorbidity 1;Comorbidity 2;Other    Comorbidities Pacemaker, right ankle fracture, OA, COPD, HTN, CHF, right hemi knee, peripheral neuropathy, atrial fib.    Examination-Activity Limitations Locomotion Level;Other    Stability/Clinical Decision Making Stable/Uncomplicated    Rehab Potential Excellent    PT Frequency 2x / week    PT Duration 6 weeks    PT Treatment/Interventions ADLs/Self Care Home Management;Gait training;Therapeutic activities;Therapeutic exercise;Balance training;Neuromuscular re-education;Patient/family education    PT Next Visit Plan Gait and balance training.  General strengthening.           Patient will benefit from skilled therapeutic intervention in order to improve the following deficits and impairments:  Abnormal gait,Decreased activity tolerance,Decreased strength  Visit Diagnosis: Muscle weakness (generalized)  Acute pain of left knee  Acute left ankle pain  Stiffness of left knee, not elsewhere classified     Problem List Patient Active Problem List   Diagnosis Date Noted  . Complete heart block ([place]) 11/09/2020  . Osteoporosis 10/11/2019  . Pacemaker 10/04/2019  . Near syncope   . Hyperlipidemia 06/23/2016  . Colovesical fistula 06/12/2016  . Atrial fibrillation with RVR ([place]) 06/12/2016  . GERD (gastroesophageal reflux disease) 06/12/2016  . Fibromyalgia 06/12/2016  . Peripheral neuropathy 06/12/2016  . Atrial fibrillation ([place]) 06/12/2016  . S/P gastric bypass 01/22/2016  . S/P splenectomy 01/22/2016  . S/P cholecystectomy 01/22/2016  . Hx of small bowel obstruction 01/22/2016  . Asthma 10/19/2015  . Generalized anxiety disorder 10/19/2015  . Depression  10/19/2015  . Insomnia 10/19/2015  . Iron deficiency anemia 10/19/2015  . Vitamin D deficiency 11/29/2014  . Eczema 11/27/2014  . Benign essential HTN 09/25/2014  .  Obesity (BMI 30-39.9) 09/25/2014  . Chronic diastolic heart failure ([place]) 09/25/2014  . Allergic rhinitis 02/28/2013    Marelyn Rouser,CHRIS , PTA 02/14/2021, 11:32 AM  [facility] [address] [place], [place], [address] Phone: [phone]   Fax:  [phone]  Name: Sabrina [name] MRN: [id number] Date of Birth: 1946/08/01

## 2021-02-20 ENCOUNTER — Ambulatory Visit: Payer: Medicare HMO | Attending: Family | Admitting: Physical Therapy

## 2021-02-20 ENCOUNTER — Other Ambulatory Visit: Payer: Self-pay

## 2021-02-20 DIAGNOSIS — M25572 Pain in left ankle and joints of left foot: Secondary | ICD-10-CM | POA: Diagnosis not present

## 2021-02-20 DIAGNOSIS — M6281 Muscle weakness (generalized): Secondary | ICD-10-CM | POA: Insufficient documentation

## 2021-02-20 DIAGNOSIS — M25662 Stiffness of left knee, not elsewhere classified: Secondary | ICD-10-CM | POA: Insufficient documentation

## 2021-02-20 DIAGNOSIS — M25562 Pain in left knee: Secondary | ICD-10-CM | POA: Diagnosis not present

## 2021-02-20 NOTE — Therapy (Signed)
[facility] [place], [place], [address] Phone: [phone]   Fax:  [phone]  Physical Therapy Treatment  Patient Details  Name: Sabrina [name] MRN: [id number] Date of Birth: 08/18/1946 Referring Provider (PT): Evelina Dun   Encounter Date: 02/20/2021   PT End of Session - 02/20/21 1036    Visit Number 3    Number of Visits 12    Date for PT Re-Evaluation 04/01/21    Authorization Type PROGRESS NOTE AT 10TH VISIT.  KX MODIFIER AFTER 15 VISITS.    PT Start Time 1030    PT Stop Time 1113    PT Time Calculation (min) 43 min    Activity Tolerance Patient tolerated treatment well    Behavior During Therapy WFL for tasks assessed/performed           Past Medical History:  Diagnosis Date  . Anemia   . Ankle fracture, right    past hx. -"no surgery"  . Anxiety   . Asthma   . CHF (congestive heart failure) ([place]) 2009  . Chronic lower back pain   . Collagen vascular disease ([place])   . COPD (chronic obstructive pulmonary disease) ([place])   . Depression   . Fibromyalgia   . GERD (gastroesophageal reflux disease)   . Hyperlipidemia   . Hypertension   . Immature cataract of both eyes   . Myocardial infarction ([place])    "I've had a light one; don't know when it happened" (08/27/2017)  . Osteoarthritis   . Peripheral neuropathy    legs and feet  . Persistent atrial fibrillation ([place])   . Tubular adenoma of colon     Past Surgical History:  Procedure Laterality Date  . APPENDECTOMY    . AV NODE ABLATION N/A 08/27/2017   Procedure: AV NODE ABLATION;  Surgeon: Evans Lance, MD;  Location: [facility];  Service: Cardiovascular;  Laterality: N/A;  . CHOLECYSTECTOMY OPEN  1978  . DILATION AND CURETTAGE OF UTERUS    . FEMUR FRACTURE SURGERY Left 2013   "put 7" rod in it"  . FRACTURE SURGERY    . LAPAROSCOPY  08/22/2016   Procedure: LAPAROSCOPY DIAGNOSTIC;  Surgeon: [name], MD;  [facility];   Service: General;;  . MEDIAL PARTIAL KNEE REPLACEMENT Right 2005   "@ [facility]"  . PACEMAKER IMPLANT N/A 08/27/2017   Procedure: PACEMAKER IMPLANT;  Surgeon: Evans Lance, MD;  Location: [facility];  Service: Cardiovascular;  Laterality: N/A;  . ROUX-EN-Y GASTRIC BYPASS  2002   [place]  . SPLENECTOMY  2002  . TONSILLECTOMY  1944  . TUBAL LIGATION    . VAGINAL HYSTERECTOMY      There were no vitals filed for this visit.   Subjective Assessment - 02/20/21 1035    Subjective COVID-19 screen performed prior to patient entering clinic.   Patient arrived and doing well, didi good after last treatment, with some fatigue during.    Pertinent History Pacemaker, right ankle fracture, OA, COPD, HTN, CHF, right hemi knee, peripheral neuropathy, atrial fib.    How long can you sit comfortably? Unlimited.    How long can you walk comfortably? Around home.    Patient Stated Goals Get stronger.    Currently in Pain? No/denies                             [facility] Adult PT Treatment/Exercise - 02/20/21  0001      Exercises   Exercises Knee/Hip;Shoulder      Knee/Hip Exercises: Aerobic   Nustep L1 x78min UE/LE      Knee/Hip Exercises: Standing   Heel Raises Both;2 sets;10 reps    Other Standing Knee Exercises small side stepping with yellow band bil directions 2x fatigue      Knee/Hip Exercises: Seated   Long Arc Quad Both;2 sets;10 reps    Long Arc Quad Weight 1 lbs.      Shoulder Exercises: Seated   Horizontal ABduction Strengthening;Both;Theraband   3x10   Theraband Level (Shoulder Horizontal ABduction) Level 1 (Yellow)    Diagonals Strengthening;Both;10 reps   2@ ball     Shoulder Exercises: ROM/Strengthening   UBE (Upper Arm Bike) 120RPM x53min 2/2                       PT Long Term Goals - 02/20/21 1037      PT LONG TERM GOAL #1   Title Patient will be independent with HEP    Time 6    Period Weeks    Status On-going       PT LONG TERM GOAL #2   Title Solid 5/5 strength grade for U and LE's.    Time 6    Period Weeks    Status On-going      PT LONG TERM GOAL #3   Title Walk 1,000 feet without rest and no LOB.    Time 6    Period Weeks    Status On-going      PT LONG TERM GOAL #4   Title Perform a reciprocating stair gait with one railing.    Time 6    Period Weeks    Status On-going                 Plan - 02/20/21 1106    Clinical Impression Statement Patient tolerated treatment well today. Patient able to progress with overall strengthening. Slow and controlled movements today, monitored throuhout. Patient continues to have some limitations with prolong activity. Today no BP drop. Patient goals progressing this week.    Personal Factors and Comorbidities Comorbidity 1;Comorbidity 2;Other    Comorbidities Pacemaker, right ankle fracture, OA, COPD, HTN, CHF, right hemi knee, peripheral neuropathy, atrial fib.    Examination-Activity Limitations Locomotion Level;Other    Examination-Participation Restrictions Other    Stability/Clinical Decision Making Stable/Uncomplicated    Rehab Potential Excellent    PT Frequency 2x / week    PT Duration 6 weeks    PT Treatment/Interventions ADLs/Self Care Home Management;Gait training;Therapeutic activities;Therapeutic exercise;Balance training;Neuromuscular re-education;Patient/family education    PT Next Visit Plan Gait and balance training.  General strengthening.    Consulted and Agree with Plan of Care Patient           Patient will benefit from skilled therapeutic intervention in order to improve the following deficits and impairments:  Abnormal gait,Decreased activity tolerance,Decreased strength  Visit Diagnosis: Muscle weakness (generalized)     Problem List Patient Active Problem List   Diagnosis Date Noted  . Complete heart block ([place]) 11/09/2020  . Osteoporosis 10/11/2019  . Pacemaker 10/04/2019  . Near syncope   .  Hyperlipidemia 06/23/2016  . Colovesical fistula 06/12/2016  . Atrial fibrillation with RVR ([place]) 06/12/2016  . GERD (gastroesophageal reflux disease) 06/12/2016  . Fibromyalgia 06/12/2016  . Peripheral neuropathy 06/12/2016  . Atrial fibrillation ([place]) 06/12/2016  . S/P gastric bypass 01/22/2016  .  S/P splenectomy 01/22/2016  . S/P cholecystectomy 01/22/2016  . Hx of small bowel obstruction 01/22/2016  . Asthma 10/19/2015  . Generalized anxiety disorder 10/19/2015  . Depression 10/19/2015  . Insomnia 10/19/2015  . Iron deficiency anemia 10/19/2015  . Vitamin D deficiency 11/29/2014  . Eczema 11/27/2014  . Benign essential HTN 09/25/2014  . Obesity (BMI 30-39.9) 09/25/2014  . Chronic diastolic heart failure ([place]) 09/25/2014  . Allergic rhinitis 02/28/2013    Jameir Ake P, PTA 02/20/2021, 11:14 AM  [facility] [place], [place], [address] Phone: [phone]   Fax:  [phone]  Name: Sabrina [name] MRN: [id number] Date of Birth: October 06, 1945

## 2021-02-25 ENCOUNTER — Ambulatory Visit (INDEPENDENT_AMBULATORY_CARE_PROVIDER_SITE_OTHER): Payer: Medicare HMO

## 2021-02-25 VITALS — Wt 153.0 lb

## 2021-02-25 DIAGNOSIS — Z0000 Encounter for general adult medical examination without abnormal findings: Secondary | ICD-10-CM | POA: Diagnosis not present

## 2021-02-25 NOTE — Progress Notes (Signed)
Subjective:   Sabrina [name] is a 75 y.o. female who presents for Medicare Annual (Subsequent) preventive examination.  Virtual Visit via Telephone Note  I connected with  Sabrina [name] on 02/25/21 at 10:30 AM EDT by telephone and verified that I am speaking with the correct person using two identifiers.  Location: Patient: Home Provider: [facility] Persons participating in the virtual visit: patient/Nurse Health Advisor   I discussed the limitations, risks, security and privacy concerns of performing an evaluation and management service by telephone and the availability of in person appointments. The patient expressed understanding and agreed to proceed.  Interactive audio and video telecommunications were attempted between this nurse and patient, however failed, due to patient having technical difficulties OR patient did not have access to video capability.  We continued and completed visit with audio only.  Some vital signs may be absent or patient reported.   Hasani Diemer E Levorn Oleski, LPN   Review of Systems     Cardiac Risk Factors include: advanced age (>57men, >8 women);dyslipidemia;hypertension     Objective:    Today's Vitals   02/25/21 1030  Weight: 153 lb (69.4 kg)   Body mass index is 26.26 kg/m.  Advanced Directives 02/25/2021 02/11/2021 01/08/2021 04/09/2020 01/31/2020 10/06/2019 02/09/2019  Does Patient Have a Medical Advance Directive? No No No No No No No  Type of Advance Directive - - - - - - -  Does patient want to make changes to medical advance directive? - - - - - - -  Copy of [place] in Chart? - - - - - - -  Would patient like information on creating a medical advance directive? Yes (MAU/Ambulatory/Procedural Areas - Information given) - No - Patient declined - No - Patient declined - No - Patient declined    Current Medications (verified) Outpatient Encounter Medications as of 02/25/2021  Medication Sig  . albuterol (VENTOLIN HFA) 108 (90  Base) MCG/ACT inhaler TAKE 2 PUFFS BY MOUTH EVERY 6 HOURS AS NEEDED FOR WHEEZE OR SHORTNESS OF BREATH  . atorvastatin (LIPITOR) 20 MG tablet Take 1 tablet (20 mg total) by mouth daily.  . DULoxetine (CYMBALTA) 60 MG capsule TAKE 1 CAPSULE EVERY DAY  . ferrous sulfate 325 (65 FE) MG tablet Take 325 mg by mouth daily.  . fluticasone (FLONASE) 50 MCG/ACT nasal spray SPRAY 2 SPRAYS INTO EACH NOSTRIL EVERY DAY  . furosemide (LASIX) 20 MG tablet Take 10 mg by mouth daily.  Marland Kitchen gabapentin (NEURONTIN) 100 MG capsule Take 1 capsule (100 mg total) by mouth 2 (two) times daily. Needs to be seen  . pantoprazole (PROTONIX) 40 MG tablet Take 40 mg by mouth 2 (two) times daily.  . potassium chloride SA (KLOR-CON) 20 MEQ tablet Take 1 tablet (20 mEq total) by mouth daily.  . SYMBICORT 80-4.5 MCG/ACT inhaler Inhale 2 puffs into the lungs 2 (two) times daily.  Marland Kitchen alendronate (FOSAMAX) 70 MG tablet Take 1 tablet (70 mg total) by mouth every 7 (seven) days. Take with a full glass of water on an empty stomach. (Patient not taking: No sig reported)  . ALPRAZolam (XANAX) 0.5 MG tablet Take 0.5 mg by mouth at bedtime as needed for anxiety. (Patient not taking: No sig reported)  . amitriptyline (ELAVIL) 75 MG tablet Take 1 tablet (75 mg total) by mouth at bedtime. (Patient not taking: No sig reported)  . calcium carbonate (OSCAL) 1500 (600 Ca) MG TABS tablet Take by mouth daily with breakfast. (Patient not taking: No  sig reported)  . Multiple Vitamins-Minerals (ONE-A-DAY 50 PLUS PO) Take 1 tablet by mouth daily. (Patient not taking: Reported on 02/25/2021)  . Omega-3 Fatty Acids (FISH OIL PO) Take 1 capsule by mouth daily. (Patient not taking: No sig reported)  . PRESCRIPTION MEDICATION Apply 1 application topically daily as needed (excema). HAL Ointment (Patient not taking: No sig reported)  . triamcinolone ointment (KENALOG) 0.5 % Apply 1 application topically 2 (two) times daily. (Patient not taking: No sig reported)  .  Vitamin D, Ergocalciferol, (DRISDOL) 1.25 MG (50000 UNIT) CAPS capsule Take 1 capsule (50,000 Units total) by mouth every 7 (seven) days. (Patient not taking: No sig reported)   No facility-administered encounter medications on file as of 02/25/2021.    Allergies (verified) Codeine   History: Past Medical History:  Diagnosis Date  . Anemia   . Ankle fracture, right    past hx. -"no surgery"  . Anxiety   . Asthma   . CHF (congestive heart failure) ([place]) 2009  . Chronic lower back pain   . Collagen vascular disease ([place])   . COPD (chronic obstructive pulmonary disease) ([place])   . Depression   . Fibromyalgia   . GERD (gastroesophageal reflux disease)   . Hyperlipidemia   . Hypertension   . Immature cataract of both eyes   . Myocardial infarction ([place])    "I've had a light one; don't know when it happened" (08/27/2017)  . Osteoarthritis   . Peripheral neuropathy    legs and feet  . Persistent atrial fibrillation ([place])   . Tubular adenoma of colon    Past Surgical History:  Procedure Laterality Date  . APPENDECTOMY    . AV NODE ABLATION N/A 08/27/2017   Procedure: AV NODE ABLATION;  Surgeon: Evans Lance, MD;  Location: [place] [facility];  Service: Cardiovascular;  Laterality: N/A;  . CHOLECYSTECTOMY OPEN  1978  . DILATION AND CURETTAGE OF UTERUS    . FEMUR FRACTURE SURGERY Left 2013   "put 7" rod in it"  . FRACTURE SURGERY    . LAPAROSCOPY  08/22/2016   Procedure: LAPAROSCOPY DIAGNOSTIC;  Surgeon: [name], MD;  Location: [facility];  Service: General;;  . MEDIAL PARTIAL KNEE REPLACEMENT Right 2005   "@ [facility]"  . PACEMAKER IMPLANT N/A 08/27/2017   Procedure: PACEMAKER IMPLANT;  Surgeon: Evans Lance, MD;  Location: [place] [facility];  Service: Cardiovascular;  Laterality: N/A;  . ROUX-EN-Y GASTRIC BYPASS  2002   [place]  . SPLENECTOMY  2002  . TONSILLECTOMY  1944  . TUBAL LIGATION    . VAGINAL HYSTERECTOMY     Family History  Problem Relation  Age of Onset  . Cancer Mother   . Alzheimer's disease Mother   . Heart disease Mother   . Arthritis Father   . Colon cancer Paternal Uncle 16  . Asthma Daughter   . Arthritis Daughter   . Obesity Daughter   . Arthritis Son   . Hyperlipidemia Son   . Obesity Son   . Arthritis Son   . Obesity Son    Social History   Socioeconomic History  . Marital status: Divorced    Spouse name: Not on file  . Number of children: 3  . Years of education: 2 years of college  . Highest education level: Some college, no degree  Occupational History  . Occupation: Retired  Tobacco Use  . Smoking status: Former Smoker    Packs/day: 0.50  Years: 25.00    Pack years: 12.50    Types: Cigarettes    Quit date: 02/26/1993    Years since quitting: 28.0  . Smokeless tobacco: Never Used  Vaping Use  . Vaping Use: Never used  Substance and Sexual Activity  . Alcohol use: Not Currently    Comment: 08/27/2017 "nothing since early 2000s"  . Drug use: Not Currently  . Sexual activity: Not Currently    Birth control/protection: Surgical  Other Topics Concern  . Not on file  Social History Narrative   ** Merged History Encounter **       Pt is right handed Lives in single story home with her grandson Has 3 adult children Associated degree  Retired Quarry manager   Social Determinants of Radio broadcast assistant Strain: [place]   . Difficulty of Paying Living Expenses: Not very hard  Food Insecurity: No Food Insecurity  . Worried About Charity fundraiser in the Last Year: Never true  . Ran Out of Food in the Last Year: Never true  Transportation Needs: No Transportation Needs  . Lack of Transportation (Medical): No  . Lack of Transportation (Non-Medical): No  Physical Activity: Sufficiently Active  . Days of Exercise per Week: 3 days  . Minutes of Exercise per Session: 60 min  Stress: Stress Concern Present  . Feeling of Stress : To some extent  Social Connections: Moderately Integrated  .  Frequency of Communication with Friends and Family: More than three times a week  . Frequency of Social Gatherings with Friends and Family: Twice a week  . Attends Religious Services: 1 to 4 times per year  . Active Member of Clubs or Organizations: Yes  . Attends Archivist Meetings: 1 to 4 times per year  . Marital Status: Divorced    Tobacco Counseling Counseling given: Not Answered   Clinical Intake:  Pre-visit preparation completed: Yes  Pain : No/denies pain     BMI - recorded: 26.26 Nutritional Status: BMI 25 -29 Overweight Nutritional Risks: None Diabetes: No  How often do you need to have someone help you when you read instructions, pamphlets, or other written materials from your doctor or pharmacy?: 1 - Never  Diabetic? No  Interpreter Needed?: No  Information entered by :: Lamyiah Crawshaw, LPN   Activities of Daily Living In your present state of health, do you have any difficulty performing the following activities: 02/25/2021  Hearing? N  Vision? N  Difficulty concentrating or making decisions? N  Walking or climbing stairs? Y  Dressing or bathing? N  Doing errands, shopping? N  Preparing Food and eating ? N  Using the Toilet? N  In the past six months, have you accidently leaked urine? Y  Do you have problems with loss of bowel control? N  Managing your Medications? N  Managing your Finances? N  Housekeeping or managing your Housekeeping? N  Some recent data might be hidden    Patient Care Team: Sharion Balloon, FNP as PCP - General (Family Medicine) Evans Lance, MD as PCP - Electrophysiology (Cardiology) Cameron Sprang, MD as Consulting Physician (Neurology)  Indicate any recent Medical Services you may have received from other than [facility] providers in the past year (date may be approximate).     Assessment:   This is a routine wellness examination for Delisa.  Hearing/Vision screen  Hearing Screening   125Hz  250Hz  500Hz  1000Hz   2000Hz  3000Hz  4000Hz  6000Hz  8000Hz   Right ear:  Left ear:           Comments: Denies hearing difficulties   Vision Screening Comments: Up to date with annual eye exams at [facility] in [place] - wears eyeglasses  Dietary issues and exercise activities discussed: Current Exercise Habits: Home exercise routine, Type of exercise: walking;stretching;strength training/weights, Time (Minutes): 60, Frequency (Times/Week): 3, Weekly Exercise (Minutes/Week): 180, Intensity: Moderate, Exercise limited by: orthopedic condition(s)  Goals Addressed            This Visit's Progress   . Increase physical activity   On track    Pt has started going back to the [facility] [employer] 3 days a week and would like to go at least 5 days      Depression Screen PHQ 2/9 Scores 02/25/2021 01/24/2021 01/11/2021 01/10/2021 08/08/2020 01/31/2020 09/30/2019  PHQ - 2 Score 1 6 6 6 2 5  0  PHQ- 9 Score 3 10 10 10 9 11  -    Fall Risk Fall Risk  02/25/2021 01/24/2021 01/11/2021 01/10/2021 08/08/2020  Falls in the past year? 1 1 1 1  0  Number falls in past yr: 1 1 1 1  -  Injury with Fall? 1 1 1 1  -  Risk for fall due to : History of fall(s);Impaired balance/gait;Impaired vision;Medication side effect;Orthopedic patient History of fall(s) History of fall(s) History of fall(s) -  Risk for fall due to: Comment - - - - -  Follow up Education provided;Falls prevention discussed Education provided Education provided Education provided -    FALL RISK PREVENTION PERTAINING TO THE HOME:  Any stairs in or around the home? Yes  If so, are there any without handrails? No  Home free of loose throw rugs in walkways, pet beds, electrical cords, etc? Yes  Adequate lighting in your home to reduce risk of falls? Yes   ASSISTIVE DEVICES UTILIZED TO PREVENT FALLS:  Life alert? Yes  Use of a cane, walker or w/c? Yes  Grab bars in the bathroom? No  Shower chair or bench in shower? No  Elevated toilet seat or a handicapped toilet? No   TIMED  UP AND GO:  Was the test performed? No . Telephonic visit.  Cognitive Function: Normal cognitive status assessed by direct observation by this Nurse Health Advisor. No abnormalities found.      6CIT Screen 02/25/2021 01/31/2020 02/09/2019  What Year? 0 points 0 points 0 points  What month? 0 points 0 points 0 points  What time? 0 points 0 points 0 points  Count back from 20 0 points 0 points 0 points  Months in reverse 2 points 0 points 0 points  Repeat phrase 2 points 2 points 0 points  Total Score 4 2 0    Immunizations Immunization History  Administered Date(s) Administered  . Influenza, High Dose Seasonal PF 07/14/2017, 06/29/2018, 06/19/2019, 08/01/2020  . Influenza,inj,Quad PF,6+ Mos 06/21/2014, 08/28/2015, 06/14/2016  . Moderna Sars-Covid-2 Vaccination 11/23/2019, 12/21/2019, 05/24/2020  . Pneumococcal Conjugate-13 10/19/2015  . Pneumococcal Polysaccharide-23 06/14/2016, 06/19/2019  . Tdap 05/19/2007, 06/29/2018  . Zoster Recombinat (Shingrix) 08/01/2020  . Zoster, Live 05/19/2014    TDAP status: Up to date  Flu Vaccine status: Up to date  Pneumococcal vaccine status: Up to date  Covid-19 vaccine status: Completed vaccines  Qualifies for Shingles Vaccine? Yes   Zostavax completed Yes   Shingrix Completed?: No.    Education has been provided regarding the importance of this vaccine. Patient has been advised to call insurance company to determine out of pocket expense if  they have not yet received this vaccine. Advised may also receive vaccine at local pharmacy or Health Dept. Verbalized acceptance and understanding.  Screening Tests Health Maintenance  Topic Date Due  . Pneumococcal Vaccine 40-63 Years old (1 of 2 - PPSV23) Never done  . Zoster Vaccines- Shingrix (2 of 2) 09/26/2020  . COLONOSCOPY (Pts 45-34yrs Insurance coverage will need to be confirmed)  02/12/2021  . INFLUENZA VACCINE  04/22/2021  . DEXA SCAN  10/09/2021  . MAMMOGRAM  04/18/2022  . TETANUS/TDAP   06/29/2028  . COVID-19 Vaccine  Completed  . Hepatitis C Screening  Completed  . PNA vac Low Risk Adult  Completed  . HPV VACCINES  Aged Out    Health Maintenance  Health Maintenance Due  Topic Date Due  . Pneumococcal Vaccine 34-33 Years old (1 of 2 - PPSV23) Never done  . Zoster Vaccines- Shingrix (2 of 2) 09/26/2020  . COLONOSCOPY (Pts 45-20yrs Insurance coverage will need to be confirmed)  02/12/2021    Colorectal cancer screening: Type of screening: Colonoscopy. Completed 02/13/2016. Repeat every 5 years - postponing for now - recent hospital stay for anemia  Mammogram status: Completed 04/18/20. Repeat every year  Bone Density status: Completed 10/10/2019. Results reflect: Bone density results: OSTEOPOROSIS. Repeat every 2 years.  Lung Cancer Screening: (Low Dose CT Chest recommended if Age 16-80 years, 30 pack-year currently smoking OR have quit w/in 15years.) does not qualify.   Additional Screening:  Hepatitis C Screening: does qualify; Completed 10/19/2015  Vision Screening: Recommended annual ophthalmology exams for early detection of glaucoma and other disorders of the eye. Is the patient up to date with their annual eye exam?  Yes  Who is the provider or what is the name of the office in which the patient attends annual eye exams? [facility] in [place] If pt is not established with a provider, would they like to be referred to a provider to establish care? No .   Dental Screening: Recommended annual dental exams for proper oral hygiene  Community Resource Referral / Chronic Care Management: CRR required this visit?  No   CCM required this visit?  No      Plan:     I have personally reviewed and noted the following in the patient's chart:   . Medical and social history . Use of alcohol, tobacco or illicit drugs  . Current medications and supplements including opioid prescriptions.  . Functional ability and status . Nutritional status . Physical  activity . Advanced directives . List of other physicians . Hospitalizations, surgeries, and ER visits in previous 12 months . Vitals . Screenings to include cognitive, depression, and falls . Referrals and appointments  In addition, I have reviewed and discussed with patient certain preventive protocols, quality metrics, and best practice recommendations. A written personalized care plan for preventive services as well as general preventive health recommendations were provided to patient.     Sandrea Hammond, LPN   01/21/8840   Nurse Notes: None

## 2021-02-25 NOTE — Patient Instructions (Signed)
Marland KitchenMs. [name] , Thank you for taking time to come for your Medicare Wellness Visit. I appreciate your ongoing commitment to your health goals. Please review the following plan we discussed and let me know if I can assist you in the future.   Screening recommendations/referrals: Colonoscopy: Done 02/13/2016 - Repeat in 5 years (Due now - make appointment when able) Mammogram: Done 04/18/20 - Repeat annually Bone Density: Done 10/10/2019 - Repeat every 2 years Recommended yearly ophthalmology/optometry visit for glaucoma screening and checkup Recommended yearly dental visit for hygiene and checkup  Vaccinations: Influenza vaccine: Done 08/01/2020 - Repeat annually Pneumococcal vaccine: Done 10/19/2015 & 06/19/2019 Tdap vaccine: Done 06/29/2018 - Repeat in 10 years Shingles vaccine: Done 11/10/221, due for second dose Covid-19: Done 11/23/2019, 12/21/2019, & 05/24/2020  Advanced directives: Advance directive discussed with you today. I have provided a copy for you to complete at home and have notarized. Once this is complete please bring a copy in to our office so we can scan it into your chart.  Conditions/risks identified: Aim for 30 minutes of exercise or brisk walking each day, drink 6-8 glasses of water and eat lots of fruits and vegetables.  Next appointment: Follow up in one year for your annual wellness visit    Preventive Care 74 Years and Older, Female Preventive care refers to lifestyle choices and visits with your health care provider that can promote health and wellness. What does preventive care include?  A yearly physical exam. This is also called an annual well check.  Dental exams once or twice a year.  Routine eye exams. Ask your health care provider how often you should have your eyes checked.  Personal lifestyle choices, including:  Daily care of your teeth and gums.  Regular physical activity.  Eating a healthy diet.  Avoiding tobacco and drug use.  Limiting alcohol  use.  Practicing safe sex.  Taking low-dose aspirin every day.  Taking vitamin and mineral supplements as recommended by your health care provider. What happens during an annual well check? The services and screenings done by your health care provider during your annual well check will depend on your age, overall health, lifestyle risk factors, and family history of disease. Counseling  Your health care provider may ask you questions about your:  Alcohol use.  Tobacco use.  Drug use.  Emotional well-being.  Home and relationship well-being.  Sexual activity.  Eating habits.  History of falls.  Memory and ability to understand (cognition).  Work and work Statistician.  Reproductive health. Screening  You may have the following tests or measurements:  Height, weight, and BMI.  Blood pressure.  Lipid and cholesterol levels. These may be checked every 5 years, or more frequently if you are over 36 years old.  Skin check.  Lung cancer screening. You may have this screening every year starting at age 74 if you have a 30-pack-year history of smoking and currently smoke or have quit within the past 15 years.  Fecal occult blood test (FOBT) of the stool. You may have this test every year starting at age 74.  Flexible sigmoidoscopy or colonoscopy. You may have a sigmoidoscopy every 5 years or a colonoscopy every 10 years starting at age 74.  Hepatitis C blood test.  Hepatitis B blood test.  Sexually transmitted disease (STD) testing.  Diabetes screening. This is done by checking your blood sugar (glucose) after you have not eaten for a while (fasting). You may have this done every 1-3 years.  Bone  density scan. This is done to screen for osteoporosis. You may have this done starting at age 74.  Mammogram. This may be done every 1-2 years. Talk to your health care provider about how often you should have regular mammograms. Talk with your health care provider about  your test results, treatment options, and if necessary, the need for more tests. Vaccines  Your health care provider may recommend certain vaccines, such as:  Influenza vaccine. This is recommended every year.  Tetanus, diphtheria, and acellular pertussis (Tdap, Td) vaccine. You may need a Td booster every 10 years.  Zoster vaccine. You may need this after age 74.  Pneumococcal 13-valent conjugate (PCV13) vaccine. One dose is recommended after age 74.  Pneumococcal polysaccharide (PPSV23) vaccine. One dose is recommended after age 74. Talk to your health care provider about which screenings and vaccines you need and how often you need them. This information is not intended to replace advice given to you by your health care provider. Make sure you discuss any questions you have with your health care provider. Document Released: 10/05/2015 Document Revised: 05/28/2016 Document Reviewed: 07/10/2015 Elsevier Interactive Patient Education  2017 [employer] Prevention in the Home Falls can cause injuries. They can happen to people of all ages. There are many things you can do to make your home safe and to help prevent falls. What can I do on the outside of my home?  Regularly fix the edges of walkways and driveways and fix any cracks.  Remove anything that might make you trip as you walk through a door, such as a raised step or threshold.  Trim any bushes or trees on the path to your home.  Use bright outdoor lighting.  Clear any walking paths of anything that might make someone trip, such as rocks or tools.  Regularly check to see if handrails are loose or broken. Make sure that both sides of any steps have handrails.  Any raised decks and porches should have guardrails on the edges.  Have any leaves, snow, or ice cleared regularly.  Use sand or salt on walking paths during winter.  Clean up any spills in your garage right away. This includes oil or grease spills. What  can I do in the bathroom?  Use night lights.  Install grab bars by the toilet and in the tub and shower. Do not use towel bars as grab bars.  Use non-skid mats or decals in the tub or shower.  If you need to sit down in the shower, use a plastic, non-slip stool.  Keep the floor dry. Clean up any water that spills on the floor as soon as it happens.  Remove soap buildup in the tub or shower regularly.  Attach bath mats securely with double-sided non-slip rug tape.  Do not have throw rugs and other things on the floor that can make you trip. What can I do in the bedroom?  Use night lights.  Make sure that you have a light by your bed that is easy to reach.  Do not use any sheets or blankets that are too big for your bed. They should not hang down onto the floor.  Have a firm chair that has side arms. You can use this for support while you get dressed.  Do not have throw rugs and other things on the floor that can make you trip. What can I do in the kitchen?  Clean up any spills right away.  Avoid walking on  wet floors.  Keep items that you use a lot in easy-to-reach places.  If you need to reach something above you, use a strong step stool that has a grab bar.  Keep electrical cords out of the way.  Do not use floor polish or wax that makes floors slippery. If you must use wax, use non-skid floor wax.  Do not have throw rugs and other things on the floor that can make you trip. What can I do with my stairs?  Do not leave any items on the stairs.  Make sure that there are handrails on both sides of the stairs and use them. Fix handrails that are broken or loose. Make sure that handrails are as long as the stairways.  Check any carpeting to make sure that it is firmly attached to the stairs. Fix any carpet that is loose or worn.  Avoid having throw rugs at the top or bottom of the stairs. If you do have throw rugs, attach them to the floor with carpet tape.  Make sure  that you have a light switch at the top of the stairs and the bottom of the stairs. If you do not have them, ask someone to add them for you. What else can I do to help prevent falls?  Wear shoes that:  Do not have high heels.  Have rubber bottoms.  Are comfortable and fit you well.  Are closed at the toe. Do not wear sandals.  If you use a stepladder:  Make sure that it is fully opened. Do not climb a closed stepladder.  Make sure that both sides of the stepladder are locked into place.  Ask someone to hold it for you, if possible.  Clearly mark and make sure that you can see:  Any grab bars or handrails.  First and last steps.  Where the edge of each step is.  Use tools that help you move around (mobility aids) if they are needed. These include:  Canes.  Walkers.  Scooters.  Crutches.  Turn on the lights when you go into a dark area. Replace any light bulbs as soon as they burn out.  Set up your furniture so you have a clear path. Avoid moving your furniture around.  If any of your floors are uneven, fix them.  If there are any pets around you, be aware of where they are.  Review your medicines with your doctor. Some medicines can make you feel dizzy. This can increase your chance of falling. Ask your doctor what other things that you can do to help prevent falls. This information is not intended to replace advice given to you by your health care provider. Make sure you discuss any questions you have with your health care provider. Document Released: 07/05/2009 Document Revised: 02/14/2016 Document Reviewed: 10/13/2014 Elsevier Interactive Patient Education  2017 [employer].

## 2021-02-25 NOTE — Progress Notes (Signed)
This chart has been fixed. Thank You

## 2021-02-26 ENCOUNTER — Ambulatory Visit: Payer: Medicare HMO | Admitting: Physical Therapy

## 2021-02-26 ENCOUNTER — Encounter: Payer: Self-pay | Admitting: Physical Therapy

## 2021-02-26 ENCOUNTER — Other Ambulatory Visit: Payer: Self-pay

## 2021-02-26 VITALS — BP 103/72 | HR 89

## 2021-02-26 DIAGNOSIS — M6281 Muscle weakness (generalized): Secondary | ICD-10-CM

## 2021-02-26 DIAGNOSIS — M25662 Stiffness of left knee, not elsewhere classified: Secondary | ICD-10-CM | POA: Diagnosis not present

## 2021-02-26 DIAGNOSIS — M25572 Pain in left ankle and joints of left foot: Secondary | ICD-10-CM | POA: Diagnosis not present

## 2021-02-26 DIAGNOSIS — M25562 Pain in left knee: Secondary | ICD-10-CM | POA: Diagnosis not present

## 2021-02-26 NOTE — Therapy (Signed)
[facility] [place], [place], [address] Phone: [phone]   Fax:  [phone]  Physical Therapy Treatment  Patient Details  Name: Sabrina [name] MRN: [id number] Date of Birth: 11-21-1945 Referring Provider (PT): Evelina Dun   Encounter Date: 02/26/2021   PT End of Session - 02/26/21 1041    Visit Number 4    Number of Visits 12    Date for PT Re-Evaluation 04/01/21    Authorization Type PROGRESS NOTE AT 10TH VISIT.  KX MODIFIER AFTER 15 VISITS.    PT Start Time 1034    PT Stop Time 1113    PT Time Calculation (min) 39 min    Activity Tolerance Patient tolerated treatment well    Behavior During Therapy WFL for tasks assessed/performed           Past Medical History:  Diagnosis Date  . Anemia   . Ankle fracture, right    past hx. -"no surgery"  . Anxiety   . Asthma   . CHF (congestive heart failure) ([place]) 2009  . Chronic lower back pain   . Collagen vascular disease ([place])   . COPD (chronic obstructive pulmonary disease) ([place])   . Depression   . Fibromyalgia   . GERD (gastroesophageal reflux disease)   . Hyperlipidemia   . Hypertension   . Immature cataract of both eyes   . Myocardial infarction ([place])    "I've had a light one; don't know when it happened" (08/27/2017)  . Osteoarthritis   . Peripheral neuropathy    legs and feet  . Persistent atrial fibrillation ([place])   . Tubular adenoma of colon     Past Surgical History:  Procedure Laterality Date  . APPENDECTOMY    . AV NODE ABLATION N/A 08/27/2017   Procedure: AV NODE ABLATION;  Surgeon: Evans Lance, MD;  Location: [facility];  Service: Cardiovascular;  Laterality: N/A;  . CHOLECYSTECTOMY OPEN  1978  . DILATION AND CURETTAGE OF UTERUS    . FEMUR FRACTURE SURGERY Left 2013   "put 7" rod in it"  . FRACTURE SURGERY    . LAPAROSCOPY  08/22/2016   Procedure: LAPAROSCOPY DIAGNOSTIC;  Surgeon: [name], MD;  Location: [facility];   Service: General;;  . MEDIAL PARTIAL KNEE REPLACEMENT Right 2005   "@ [facility]"  . PACEMAKER IMPLANT N/A 08/27/2017   Procedure: PACEMAKER IMPLANT;  Surgeon: Evans Lance, MD;  Location: [facility];  Service: Cardiovascular;  Laterality: N/A;  . ROUX-EN-Y GASTRIC BYPASS  2002   [place]  . SPLENECTOMY  2002  . TONSILLECTOMY  1944  . TUBAL LIGATION    . VAGINAL HYSTERECTOMY      Vitals:   02/26/21 1040  BP: 103/72  Pulse: 89  SpO2: 100%     Subjective Assessment - 02/26/21 1039    Subjective COVID-19 screen performed prior to patient entering clinic. Reports feeling good today and can breathe better as the temperature is lower.    Pertinent History Pacemaker, right ankle fracture, OA, COPD, HTN, CHF, right hemi knee, peripheral neuropathy, atrial fib.    How long can you sit comfortably? Unlimited.    How long can you walk comfortably? Around home.    Patient Stated Goals Get stronger.    Currently in Pain? No/denies              [facility] PT Assessment - 02/26/21 0001      Assessment   Medical Diagnosis  Weakness.    Referring Provider (PT) Evelina Dun    Next MD Visit 02/28/2021      Precautions   Precautions Fall    Precaution Comments Pacemaker.  Monitor vitals.  No valsalva maneuver.  Be midful of abdominal incision.      Restrictions   Weight Bearing Restrictions No                         [facility] Adult PT Treatment/Exercise - 02/26/21 0001      Knee/Hip Exercises: Aerobic   Nustep L4 x18 min      Knee/Hip Exercises: Standing   Heel Raises Both;2 sets;10 reps    Hip Abduction AROM;Both;2 sets;10 reps;Knee straight      Knee/Hip Exercises: Seated   Long Arc Quad Strengthening;Both;3 sets;10 reps;Weights    Long Arc Quad Weight 1 lbs.    Marching Strengthening;Both;2 sets;10 reps;Weights    Marching Weights 1 lbs.    Hamstring Curl Strengthening;Both;3 sets;10 reps;Limitations    Hamstring Limitations red theraband                        PT Long Term Goals - 02/20/21 1037      PT LONG TERM GOAL #1   Title Patient will be independent with HEP    Time 6    Period Weeks    Status On-going      PT LONG TERM GOAL #2   Title Solid 5/5 strength grade for U and LE's.    Time 6    Period Weeks    Status On-going      PT LONG TERM GOAL #3   Title Walk 1,000 feet without rest and no LOB.    Time 6    Period Weeks    Status On-going      PT LONG TERM GOAL #4   Title Perform a reciprocating stair gait with one railing.    Time 6    Period Weeks    Status On-going                 Plan - 02/26/21 1219    Clinical Impression Statement Patient presented in clinic with no complaints of pain. Patient able to tolerate all therex sessions without complaint of pain and focus of posture. Patient also cautioned to avoid extreme tension on abdominal incision. Vitals taken following end of treatment as 100/66, 100%, 71 bpm.    Personal Factors and Comorbidities Comorbidity 1;Comorbidity 2;Other    Comorbidities Pacemaker, right ankle fracture, OA, COPD, HTN, CHF, right hemi knee, peripheral neuropathy, atrial fib.    Examination-Activity Limitations Locomotion Level;Other    Examination-Participation Restrictions Other    Stability/Clinical Decision Making Stable/Uncomplicated    Rehab Potential Excellent    PT Frequency 2x / week    PT Duration 6 weeks    PT Treatment/Interventions ADLs/Self Care Home Management;Gait training;Therapeutic activities;Therapeutic exercise;Balance training;Neuromuscular re-education;Patient/family education    PT Next Visit Plan Gait and balance training.  General strengthening.    Consulted and Agree with Plan of Care Patient           Patient will benefit from skilled therapeutic intervention in order to improve the following deficits and impairments:  Abnormal gait,Decreased activity tolerance,Decreased strength  Visit Diagnosis: Muscle weakness  (generalized)     Problem List Patient Active Problem List   Diagnosis Date Noted  . ABLA (acute blood loss anemia) 12/14/2020  . Acute deep vein  thrombosis (DVT) of lower extremity ([place]) 12/14/2020  . Chronic anticoagulation 12/14/2020  . Calcaneal bursitis 12/12/2020  . Acute upper GI bleed 12/08/2020  . Complete heart block ([place]) 11/09/2020  . Osteoporosis 10/11/2019  . Pacemaker 10/04/2019  . Near syncope   . Hyperlipidemia 06/23/2016  . Colovesical fistula 06/12/2016  . Atrial fibrillation with RVR ([place]) 06/12/2016  . GERD (gastroesophageal reflux disease) 06/12/2016  . Fibromyalgia 06/12/2016  . Peripheral neuropathy 06/12/2016  . Atrial fibrillation ([place]) 06/12/2016  . S/P gastric bypass 01/22/2016  . S/P splenectomy 01/22/2016  . S/P cholecystectomy 01/22/2016  . Hx of small bowel obstruction 01/22/2016  . Asthma 10/19/2015  . Generalized anxiety disorder 10/19/2015  . Depression 10/19/2015  . Insomnia 10/19/2015  . Iron deficiency anemia 10/19/2015  . Vitamin D deficiency 11/29/2014  . Eczema 11/27/2014  . Benign essential HTN 09/25/2014  . Obesity (BMI 30-39.9) 09/25/2014  . Chronic diastolic heart failure ([place]) 09/25/2014  . Allergic rhinitis 02/28/2013    Standley Brooking, PTA 02/26/2021, 12:28 PM  [facility] [address] [place], [place], [address] Phone: [phone]   Fax:  [phone]  Name: MAHOGANY TORRANCE MRN: [id number] Date of Birth: 1945-09-27

## 2021-02-28 ENCOUNTER — Ambulatory Visit: Payer: Medicare HMO | Admitting: Physical Therapy

## 2021-02-28 ENCOUNTER — Other Ambulatory Visit: Payer: Self-pay

## 2021-02-28 DIAGNOSIS — M25572 Pain in left ankle and joints of left foot: Secondary | ICD-10-CM | POA: Diagnosis not present

## 2021-02-28 DIAGNOSIS — M6281 Muscle weakness (generalized): Secondary | ICD-10-CM

## 2021-02-28 DIAGNOSIS — M25662 Stiffness of left knee, not elsewhere classified: Secondary | ICD-10-CM | POA: Diagnosis not present

## 2021-02-28 DIAGNOSIS — M25562 Pain in left knee: Secondary | ICD-10-CM | POA: Diagnosis not present

## 2021-02-28 NOTE — Therapy (Signed)
[facility] [place], [place], [address] Phone: [phone]   Fax:  [phone]  Physical Therapy Treatment  Patient Details  Name: Sabrina [name] MRN: [id number] Date of Birth: 1946/08/02 Referring Provider (PT): Evelina Dun   Encounter Date: 02/28/2021   PT End of Session - 02/28/21 1039     Visit Number 5    Number of Visits 12    Date for PT Re-Evaluation 04/01/21    Authorization Type PROGRESS NOTE AT 10TH VISIT.  KX MODIFIER AFTER 15 VISITS.    PT Start Time 1030    PT Stop Time 1110    PT Time Calculation (min) 40 min    Activity Tolerance Patient tolerated treatment well    Behavior During Therapy WFL for tasks assessed/performed             Past Medical History:  Diagnosis Date   Anemia    Ankle fracture, right    past hx. -"no surgery"   Anxiety    Asthma    CHF (congestive heart failure) ([place]) 2009   Chronic lower back pain    Collagen vascular disease ([facility])    COPD (chronic obstructive pulmonary disease) (H[facility])    Depression    Fibromyalgia    GERD (gastroesophageal reflux disease)    Hyperlipidemia    Hypertension    Immature cataract of both eyes    Myocardial infarction ([place])    "I've had a light one; don't know when it happened" (08/27/2017)   Osteoarthritis    Peripheral neuropathy    legs and feet   Persistent atrial fibrillation ([facility])    Tubular adenoma of colon     Past Surgical History:  Procedure Laterality Date   APPENDECTOMY     AV NODE ABLATION N/A 08/27/2017   Procedure: AV NODE ABLATION;  Surgeon: Evans Lance, MD;  Location: [facility];  Service: Cardiovascular;  Laterality: N/A;   CHOLECYSTECTOMY OPEN  1978   DILATION AND CURETTAGE OF UTERUS     FEMUR FRACTURE SURGERY Left 2013   "put 7" rod in it"   FRACTURE SURGERY     LAPAROSCOPY  08/22/2016   Procedure: LAPAROSCOPY DIAGNOSTIC;  Surgeon: [name], MD;  [facility];  Service: General;;    MEDIAL PARTIAL KNEE REPLACEMENT Right 2005   "@ [facility]"   PACEMAKER IMPLANT N/A 08/27/2017   Procedure: PACEMAKER IMPLANT;  Surgeon: Evans Lance, MD;  Location: [facility];  Service: Cardiovascular;  Laterality: N/A;   ROUX-EN-Y GASTRIC BYPASS  2002   [place]  2002   TONSILLECTOMY  1944   TUBAL LIGATION     VAGINAL HYSTERECTOMY      There were no vitals filed for this visit.   Subjective Assessment - 02/28/21 1037     Subjective COVID-19 screen performed prior to patient entering clinic. Patient arrived doing well and feels improvement.    Pertinent History Pacemaker, right ankle fracture, OA, COPD, HTN, CHF, right hemi knee, peripheral neuropathy, atrial fib.    How long can you sit comfortably? Unlimited.    How long can you walk comfortably? Around home.    Patient Stated Goals Get stronger.    Currently in Pain? No/denies                               [facility] Adult PT Treatment/Exercise - 02/28/21 0001  Knee/Hip Exercises: Aerobic   Nustep L4 x62mn 75-80 SPM to improve endurance      Knee/Hip Exercises: Seated   Long Arc Quad Strengthening;Both;2 sets;10 reps    Long Arc Quad Limitations red band    Clamshell with TheraBand Red   x30   Marching Strengthening;Both;2 sets;10 reps   with red band   Hamstring Curl Strengthening;Both;2 sets;10 reps    Hamstring Limitations red theraband      Shoulder Exercises: ROM/Strengthening   UBE (Upper Arm Bike) 120RPM x680m 3/3                    PT Education - 02/28/21 1104     Education Details HEP    Person(s) Educated Patient    Methods Explanation;Demonstration;Handout    Comprehension Verbalized understanding;Returned demonstration                 PT Long Term Goals - 02/28/21 1040       PT LONG TERM GOAL #1   Title Patient will be independent with HEP    Baseline Met 02/28/21    Time 6    Period Weeks    Status Achieved      PT  LONG TERM GOAL #2   Title Solid 5/5 strength grade for U and LE's.    Time 6    Period Weeks    Status On-going      PT LONG TERM GOAL #3   Title Walk 1,000 feet without rest and no LOB.    Time 6    Period Weeks    Status On-going      PT LONG TERM GOAL #4   Title Perform a reciprocating stair gait with one railing.    Time 6    Period Weeks    Status On-going                   Plan - 02/28/21 1055     Clinical Impression Statement Patient has progressed today with endurance and PRE's. Patient has reported no episodes of LOB or weakness. Patient able to walk 4 blocks and perform home activities with greater ease. Patient issued HEP for progression and met goal with other current goals progressing. Vitals WNL today.    Personal Factors and Comorbidities Comorbidity 1;Comorbidity 2;Other    Comorbidities Pacemaker, right ankle fracture, OA, COPD, HTN, CHF, right hemi knee, peripheral neuropathy, atrial fib.    Examination-Activity Limitations Locomotion Level;Other    Stability/Clinical Decision Making Stable/Uncomplicated    Rehab Potential Excellent    PT Frequency 2x / week    PT Duration 6 weeks    PT Treatment/Interventions ADLs/Self Care Home Management;Gait training;Therapeutic activities;Therapeutic exercise;Balance training;Neuromuscular re-education;Patient/family education    PT Next Visit Plan Gait and balance training.  General strengthening.    Consulted and Agree with Plan of Care Patient             Patient will benefit from skilled therapeutic intervention in order to improve the following deficits and impairments:  Abnormal gait, Decreased activity tolerance, Decreased strength  Visit Diagnosis: Muscle weakness (generalized)     Problem List Patient Active Problem List   Diagnosis Date Noted   ABLA (acute blood loss anemia) 12/14/2020   Acute deep vein thrombosis (DVT) of lower extremity ([facility]) 12/14/2020   Chronic anticoagulation  12/14/2020   Calcaneal bursitis 12/12/2020   Acute upper GI bleed 12/08/2020   [place]/18[place]20[place]   Osteoporosis 10/11/2019   Pacemaker  10/04/2019   Near syncope    Hyperlipidemia 06/23/2016   Colovesical fistula 06/12/2016   Atrial fibrillation with RVR ([facility]) 06/12/2016   GERD (gastroesophageal reflux disease) 06/12/2016   Fibromyalgia 06/12/2016   Peripheral neuropathy 06/12/2016   Atrial fibrillation ([place]) 06/12/2016   S/P gastric bypass 01/22/2016   S/P splenectomy 01/22/2016   S/P cholecystectomy 01/22/2016   Hx of small bowel obstruction 01/22/2016   Asthma 10/19/2015   Generalized anxiety disorder 10/19/2015   Depression 10/19/2015   Insomnia 10/19/2015   Iron deficiency anemia 10/19/2015   Vitamin D deficiency 11/29/2014   Eczema 11/27/2014   Benign essential HTN 09/25/2014   Obesity (BMI 30-39.9) 09/25/2014   Chronic diastolic heart failure ([place]) 09/25/2014   Allergic rhinitis 02/28/2013    Konstance Happel P, PTA 02/28/2021, 11:11 AM  [facility] [address] [place], [place], [address] Phone: [phone]   Fax:  [phone]  Name: SHONNIE POUDRIER MRN: [id number] Date of Birth: 06/18/1946

## 2021-02-28 NOTE — Patient Instructions (Signed)
  Lower Body: Toe Rise   Standing, place feet apart. Hold arms out for balance or use support. Rise up on toes. Hold _3___ seconds, then lower. Repeat immediately. Repeat __10__ times. Do __2__ sessions per day.   Half Squat to Chair   Stand with feet shoulder width apart. Push buttocks backward and lower slowly, sitting in chair lightly and returning to standing position. Complete _2_ sets of 10_ repetitions. Perform __2-3_ sessions per day.     Hip abduction   While sitting with good posture, tie theraband around knees and pull apart. Slowly resume starting position. x30 1-2 x day  Knee Extension: Resisted (Sitting)    With band looped around right ankle and under other foot, straighten leg with ankle loop. Keep other leg bent to increase resistance. Repeat __20__ times per set. Do __1-2__ sets per session. Do __1-2__ sessions per day.   Knee Flexion: Resisted (Sitting)    Sit with band under left foot and looped around ankle of supported leg. Pull unsupported leg back. Repeat _20___ times per set. Do __1-2__ sets per session. Do __1-2__ sessions per day.    SEATED MARCHING - ELASTIC BAND  Start by sitting in a chair with an elastic band wrapped around your lower thighs.  Next, move a knee upward, set it back down and then alternate to the other side. Perform 10-20 1-2x daily

## 2021-03-05 ENCOUNTER — Ambulatory Visit: Payer: Medicare HMO

## 2021-03-05 ENCOUNTER — Other Ambulatory Visit: Payer: Self-pay

## 2021-03-05 VITALS — BP 126/87

## 2021-03-05 DIAGNOSIS — M6281 Muscle weakness (generalized): Secondary | ICD-10-CM | POA: Diagnosis not present

## 2021-03-05 DIAGNOSIS — M25572 Pain in left ankle and joints of left foot: Secondary | ICD-10-CM

## 2021-03-05 DIAGNOSIS — M25562 Pain in left knee: Secondary | ICD-10-CM

## 2021-03-05 DIAGNOSIS — M25662 Stiffness of left knee, not elsewhere classified: Secondary | ICD-10-CM | POA: Diagnosis not present

## 2021-03-05 NOTE — Therapy (Signed)
[facility] [place], [place], [address] Phone: [phone]   Fax:  [phone]  Physical Therapy Treatment  Patient Details  Name: Sabrina [name] MRN: [id number] Date of Birth: [birth year] Referring Provider (PT): Evelina Dun   Encounter Date: 03/05/2021   PT End of Session - 03/05/21 1447     Visit Number 6    Number of Visits 12    Date for PT Re-Evaluation 04/01/21    Authorization Type PROGRESS NOTE AT 10TH VISIT.  KX MODIFIER AFTER 15 VISITS.    PT Start Time 3335    PT Stop Time 1430    PT Time Calculation (min) 45 min    Activity Tolerance Patient tolerated treatment well    Behavior During Therapy WFL for tasks assessed/performed             Past Medical History:  Diagnosis Date   Anemia    Ankle fracture, right    past hx. -"no surgery"   Anxiety    Asthma    CHF (congestive heart failure) ([place]) 2009   Chronic lower back pain    Collagen vascular disease (HCC)    COPD (chronic obstructive pulmonary disease) (HCC)    Depression    Fibromyalgia    GERD (gastroesophageal reflux disease)    Hyperlipidemia    Hypertension    Immature cataract of both eyes    Myocardial infarction ([place])    "I've had a light one; don't know when it happened" (08/27/2017)   Osteoarthritis    Peripheral neuropathy    legs and feet   Persistent atrial fibrillation (HCC)    Tubular adenoma of colon     Past Surgical History:  Procedure Laterality Date   APPENDECTOMY     AV NODE ABLATION N/A 08/27/2017   Procedure: AV NODE ABLATION;  Surgeon: Evans Lance, MD;  Location: [facility];  Service: Cardiovascular;  Laterality: N/A;   CHOLECYSTECTOMY OPEN  1978   DILATION AND CURETTAGE OF UTERUS     FEMUR FRACTURE SURGERY Left 2013   "put 7" rod in it"   FRACTURE SURGERY     LAPAROSCOPY  08/22/2016   Procedure: LAPAROSCOPY DIAGNOSTIC;  Surgeon: [name], MD;  Location: [facility];  Service: General;;    MEDIAL PARTIAL KNEE REPLACEMENT Right 2005   "@ [facility]"   PACEMAKER IMPLANT N/A 08/27/2017   Procedure: PACEMAKER IMPLANT;  Surgeon: Evans Lance, MD;  Location: [facility];  Service: Cardiovascular;  Laterality: N/A;   ROUX-EN-Y GASTRIC BYPASS  2002   [place]  2002   TONSILLECTOMY  1944   TUBAL LIGATION     VAGINAL HYSTERECTOMY      Vitals:   03/05/21 1407  BP: 126/87     Subjective Assessment - 03/05/21 1422     Subjective COVID-19 screen performed prior to patient entering clinic. Patient states that her balance is the most challenged but other than that she is doing well.    Pertinent History Pacemaker, right ankle fracture, OA, COPD, HTN, CHF, right hemi knee, peripheral neuropathy, atrial fib.                               [facility] Adult PT Treatment/Exercise - 03/05/21 1345       Transfers   Comments Supervision with gait      Ambulation/Gait   Gait Comments Slow and cautious  without LOB.      High Level Balance   High Level Balance Activities Backward walking;Tandem walking;Other (comment)    High Level Balance Comments on foam airex; standing narrow stance on foam pad wit hball raises,      Exercises   Exercises Knee/Hip;Shoulder      Knee/Hip Exercises: Stretches   Active Hamstring Stretch Both;3 reps;20 seconds    Active Hamstring Stretch Limitations foot on 6" step    Gastroc Stretch Both;20 seconds    Gastroc Stretch Limitations on rocker board      Knee/Hip Exercises: Aerobic   Nustep L4 x 90mn 75-80 SPM to improve endurance      Knee/Hip Exercises: Seated   Long Arc Quad Strengthening;Both;2 sets;10 reps    Long Arc Quad Weight 3 lbs.    Other Seated Knee/Hip Exercises SLR seated with 3# at ankle and hip ER with min A    Marching Strengthening;Both;2 sets;10 reps                         PT Long Term Goals - 02/28/21 1040       PT LONG TERM GOAL #1   Title Patient will  be independent with HEP    Baseline Met 02/28/21    Time 6    Period Weeks    Status Achieved      PT LONG TERM GOAL #2   Title Solid 5/5 strength grade for U and LE's.    Time 6    Period Weeks    Status On-going      PT LONG TERM GOAL #3   Title Walk 1,000 feet without rest and no LOB.    Time 6    Period Weeks    Status On-going      PT LONG TERM GOAL #4   Title Perform a reciprocating stair gait with one railing.    Time 6    Period Weeks    Status On-going                   Plan - 03/05/21 1447     Clinical Impression Statement Patient was progressed with standing balance and endurance on foam pads. She demonstrates excellent strength and endurance with todays therapy intervention. She maintained normal vitals and efforts during session.  Goals progressing well with minimal limitation.    Personal Factors and Comorbidities Comorbidity 1;Comorbidity 2;Other    Comorbidities Pacemaker, right ankle fracture, OA, COPD, HTN, CHF, right hemi knee, peripheral neuropathy, atrial fib.    Examination-Activity Limitations Locomotion Level;Other    Examination-Participation Restrictions Other    Stability/Clinical Decision Making Stable/Uncomplicated    Clinical Decision Making Low             Patient will benefit from skilled therapeutic intervention in order to improve the following deficits and impairments:  Abnormal gait, Decreased activity tolerance, Decreased strength  Visit Diagnosis: Muscle weakness (generalized)  Acute pain of left knee  Acute left ankle pain  Stiffness of left knee, not elsewhere classified     Problem List Patient Active Problem List   Diagnosis Date Noted   ABLA (acute blood loss anemia) 12/14/2020   Acute deep vein thrombosis (DVT) of lower extremity ([facility] 12/14/2020   Chronic anticoagulation 12/14/2020   Calcaneal bursitis 12/12/2020   Acute upper GI bleed 12/08/2020   Complete heart block ([place] 11/09/2020   Osteoporosis  10/11/2019   Pacemaker 10/04/2019   Near syncope  Hyperlipidemia 06/23/2016   Colovesical fistula 06/12/2016   Atrial fibrillation with RVR (HCC) 06/12/2016   GERD (gastroesophageal reflux disease) 06/12/2016   Fibromyalgia 06/12/2016   Peripheral neuropathy 06/12/2016   Atrial fibrillation ([place]) 06/12/2016   S/P gastric bypass 01/22/2016   S/P splenectomy 01/22/2016   S/P cholecystectomy 01/22/2016   Hx of small bowel obstruction 01/22/2016   Asthma 10/19/2015   Generalized anxiety disorder 10/19/2015   Depression 10/19/2015   Insomnia 10/19/2015   Iron deficiency anemia 10/19/2015   Vitamin D deficiency 11/29/2014   Eczema 11/27/2014   Benign essential HTN 09/25/2014   Obesity (BMI 30-39.9) 09/25/2014   Chronic diastolic heart failure ([place]) 09/25/2014   Allergic rhinitis 02/28/2013    Marylou Mccoy PT, DPT 03/05/2021, 4:14 PM  [facility] [address] [place], [place], [address] Phone: [phone]   Fax:  [phone]  Name: MALISA RUGGIERO MRN: [id number] Date of Birth: [birth year]

## 2021-03-07 ENCOUNTER — Other Ambulatory Visit: Payer: Self-pay

## 2021-03-07 ENCOUNTER — Ambulatory Visit: Payer: Medicare HMO | Admitting: Physical Therapy

## 2021-03-07 DIAGNOSIS — M25562 Pain in left knee: Secondary | ICD-10-CM | POA: Diagnosis not present

## 2021-03-07 DIAGNOSIS — M25662 Stiffness of left knee, not elsewhere classified: Secondary | ICD-10-CM | POA: Diagnosis not present

## 2021-03-07 DIAGNOSIS — M6281 Muscle weakness (generalized): Secondary | ICD-10-CM | POA: Diagnosis not present

## 2021-03-07 DIAGNOSIS — M25572 Pain in left ankle and joints of left foot: Secondary | ICD-10-CM | POA: Diagnosis not present

## 2021-03-07 NOTE — Therapy (Signed)
[facility] [place], [place], [address] Phone: [phone]   Fax:  [phone]  Physical Therapy Treatment  Patient Details  Name: Sabrina [name] MRN: [id number] Date of Birth: [birth year] Referring Provider (PT): Evelina Dun   Encounter Date: 03/07/2021   PT End of Session - 03/07/21 1155     Visit Number 7    Number of Visits 12    Date for PT Re-Evaluation 04/01/21    Authorization Type PROGRESS NOTE AT 10TH VISIT.  KX MODIFIER AFTER 15 VISITS.    PT Start Time 1115    PT Stop Time 1158    PT Time Calculation (min) 43 min    Activity Tolerance Patient tolerated treatment well    Behavior During Therapy WFL for tasks assessed/performed             Past Medical History:  Diagnosis Date   Anemia    Ankle fracture, right    past hx. -"no surgery"   Anxiety    Asthma    CHF (congestive heart failure) ([place]) 2009   Chronic lower back pain    Collagen vascular disease (HCC)    COPD (chronic obstructive pulmonary disease) (HCC)    Depression    Fibromyalgia    GERD (gastroesophageal reflux disease)    Hyperlipidemia    Hypertension    Immature cataract of both eyes    Myocardial infarction ([place])    "I've had a light one; don't know when it happened" (08/27/2017)   Osteoarthritis    Peripheral neuropathy    legs and feet   Persistent atrial fibrillation (HCC)    Tubular adenoma of colon     Past Surgical History:  Procedure Laterality Date   APPENDECTOMY     AV NODE ABLATION N/A 08/27/2017   Procedure: AV NODE ABLATION;  Surgeon: Evans Lance, MD;  Location: [facility];  Service: Cardiovascular;  Laterality: N/A;   CHOLECYSTECTOMY OPEN  1978   DILATION AND CURETTAGE OF UTERUS     FEMUR FRACTURE SURGERY Left 2013   "put 7" rod in it"   FRACTURE SURGERY     LAPAROSCOPY  08/22/2016   Procedure: LAPAROSCOPY DIAGNOSTIC;  Surgeon: [name], MD;  [facility];  Service: General;;    MEDIAL PARTIAL KNEE REPLACEMENT Right 2005   "@ [facility]"   PACEMAKER IMPLANT N/A 08/27/2017   Procedure: PACEMAKER IMPLANT;  Surgeon: Evans Lance, MD;  Location: [facility];  Service: Cardiovascular;  Laterality: N/A;   ROUX-EN-Y GASTRIC BYPASS  2002   [place]  2002   TONSILLECTOMY  1944   TUBAL LIGATION     VAGINAL HYSTERECTOMY      There were no vitals filed for this visit.   Subjective Assessment - 03/07/21 1139     Subjective COVID-19 screen performed prior to patient entering clinic. Patient states reported doing very well today and would like to continue to increase activity tolerance    Pertinent History Pacemaker, right ankle fracture, OA, COPD, HTN, CHF, right hemi knee, peripheral neuropathy, atrial fib.    How long can you sit comfortably? Unlimited.    How long can you walk comfortably? Around home.    Patient Stated Goals Get stronger.    Currently in Pain? No/denies  [facility] Adult PT Treatment/Exercise - 03/07/21 0001       Knee/Hip Exercises: Aerobic   Nustep L4 x23mn UE/LE to increase activity tolerance   70-80 SPM vitals WNL     Knee/Hip Exercises: Standing   Rocker Board 1 minute   balance   Other Standing Knee Exercises standing on airex x148m      Knee/Hip Exercises: Seated   Long Arc Quad Strengthening;Both;2 sets;10 reps    Long Arc Quad Weight 3 lbs.    Marching Strengthening;Both;2 sets;10 reps    Marching Weights 3 lbs.                         PT Long Term Goals - 03/07/21 1155       PT LONG TERM GOAL #1   Title Patient will be independent with HEP    Baseline Met 02/28/21    Time 6    Period Weeks    Status Achieved      PT LONG TERM GOAL #2   Title Solid 5/5 strength grade for U and LE's.    Time 6    Period Weeks    Status On-going      PT LONG TERM GOAL #3   Title Walk 1,000 feet without rest and no LOB.    Baseline ongoing  LOB at times with prolong distance 03/07/21    Time 6    Period Weeks    Status On-going      PT LONG TERM GOAL #4   Title Perform a reciprocating stair gait with one railing.    Time 6    Period Weeks    Status On-going                   Plan - 03/07/21 1156     Clinical Impression Statement Patient tolerated treatment well today. Patient requested 3064mon nustep to increase activity tolerance. monitored patient throughout and vitals WNL after. patient continues to report some LOB with prolong walking. Patient is able to perform ADL's with greater ease. goals progressing this week.    Personal Factors and Comorbidities Comorbidity 1;Comorbidity 2;Other    Comorbidities Pacemaker, right ankle fracture, OA, COPD, HTN, CHF, right hemi knee, peripheral neuropathy, atrial fib.    Examination-Activity Limitations Locomotion Level;Other    Examination-Participation Restrictions Other    Stability/Clinical Decision Making Stable/Uncomplicated    Rehab Potential Excellent    PT Frequency 2x / week    PT Duration 6 weeks    PT Treatment/Interventions ADLs/Self Care Home Management;Gait training;Therapeutic activities;Therapeutic exercise;Balance training;Neuromuscular re-education;Patient/family education    PT Next Visit Plan Gait and balance training.  General strengthening.    Consulted and Agree with Plan of Care Patient             Patient will benefit from skilled therapeutic intervention in order to improve the following deficits and impairments:  Abnormal gait, Decreased activity tolerance, Decreased strength  Visit Diagnosis: Muscle weakness (generalized)     Problem List Patient Active Problem List   Diagnosis Date Noted   ABLA (acute blood loss anemia) 12/14/2020   Acute deep vein thrombosis (DVT) of lower extremity ([facility]3/25/2022   Chronic anticoagulation 12/14/2020   Calcaneal bursitis 12/12/2020   Acute upper GI bleed 12/08/2020   Complete heart block  ([facility]2/18/2022   Osteoporosis 10/11/2019   Pacemaker 10/04/2019   Near syncope    Hyperlipidemia 06/23/2016   Colovesical fistula 06/12/2016   Atrial fibrillation  with RVR ([place]) 06/12/2016   GERD (gastroesophageal reflux disease) 06/12/2016   Fibromyalgia 06/12/2016   Peripheral neuropathy 06/12/2016   Atrial fibrillation ([place]) 06/12/2016   S/P gastric bypass 01/22/2016   S/P splenectomy 01/22/2016   S/P cholecystectomy 01/22/2016   Hx of small bowel obstruction 01/22/2016   Asthma 10/19/2015   Generalized anxiety disorder 10/19/2015   Depression 10/19/2015   Insomnia 10/19/2015   Iron deficiency anemia 10/19/2015   Vitamin D deficiency 11/29/2014   Eczema 11/27/2014   Benign essential HTN 09/25/2014   Obesity (BMI 30-39.9) 09/25/2014   Chronic diastolic heart failure ([place]) 09/25/2014   Allergic rhinitis 02/28/2013    ,  P, PTA 03/07/2021, 12:01 PM  [facility] [address] [place], [place], [address] Phone: [phone]   Fax:  [phone]  Name: Sabrina [name] MRN: [id number] Date of Birth: 09-15-1946

## 2021-03-12 ENCOUNTER — Ambulatory Visit: Payer: Medicare HMO | Admitting: Physical Therapy

## 2021-03-12 ENCOUNTER — Other Ambulatory Visit: Payer: Self-pay

## 2021-03-12 ENCOUNTER — Encounter: Payer: Self-pay | Admitting: Physical Therapy

## 2021-03-12 DIAGNOSIS — M6281 Muscle weakness (generalized): Secondary | ICD-10-CM | POA: Diagnosis not present

## 2021-03-12 DIAGNOSIS — M25562 Pain in left knee: Secondary | ICD-10-CM

## 2021-03-12 DIAGNOSIS — M25662 Stiffness of left knee, not elsewhere classified: Secondary | ICD-10-CM | POA: Diagnosis not present

## 2021-03-12 DIAGNOSIS — M25572 Pain in left ankle and joints of left foot: Secondary | ICD-10-CM | POA: Diagnosis not present

## 2021-03-12 NOTE — Therapy (Signed)
[facility] [place], [place], [address] Phone: [phone]   Fax:  [phone]  Physical Therapy Treatment  Patient Details  Name: Sabrina [name] MRN: [id number] Date of Birth: 1946-06-15 Referring Provider (PT): Evelina Dun   Encounter Date: 03/12/2021   PT End of Session - 03/12/21 1523     Visit Number 8    Number of Visits 12    Date for PT Re-Evaluation 04/01/21    Authorization Type PROGRESS NOTE AT 10TH VISIT.  KX MODIFIER AFTER 15 VISITS.    PT Start Time 6237    PT Stop Time 1559    PT Time Calculation (min) 44 min    Activity Tolerance Patient tolerated treatment well    Behavior During Therapy WFL for tasks assessed/performed             Past Medical History:  Diagnosis Date   Anemia    Ankle fracture, right    past hx. -"no surgery"   Anxiety    Asthma    CHF (congestive heart failure) ([place]) 2009   Chronic lower back pain    Collagen vascular disease (HCC)    COPD (chronic obstructive pulmonary disease) (HCC)    Depression    Fibromyalgia    GERD (gastroesophageal reflux disease)    Hyperlipidemia    Hypertension    Immature cataract of both eyes    Myocardial infarction ([place])    "I've had a light one; don't know when it happened" (08/27/2017)   Osteoarthritis    Peripheral neuropathy    legs and feet   Persistent atrial fibrillation (HCC)    Tubular adenoma of colon     Past Surgical History:  Procedure Laterality Date   APPENDECTOMY     AV NODE ABLATION N/A 08/27/2017   Procedure: AV NODE ABLATION;  Surgeon: Evans Lance, MD;  Location: [facility];  Service: Cardiovascular;  Laterality: N/A;   CHOLECYSTECTOMY OPEN  1978   DILATION AND CURETTAGE OF UTERUS     FEMUR FRACTURE SURGERY Left 2013   "put 7" rod in it"   FRACTURE SURGERY     LAPAROSCOPY  08/22/2016   Procedure: LAPAROSCOPY DIAGNOSTIC;  Surgeon: [name], MD;  [facility];  Service: General;;    MEDIAL PARTIAL KNEE REPLACEMENT Right 2005   "@ [facility]"   PACEMAKER IMPLANT N/A 08/27/2017   Procedure: PACEMAKER IMPLANT;  Surgeon: Evans Lance, MD;  Location: [facility];  Service: Cardiovascular;  Laterality: N/A;   ROUX-EN-Y GASTRIC BYPASS  2002   [place]  2002   TONSILLECTOMY  1944   TUBAL LIGATION     VAGINAL HYSTERECTOMY      There were no vitals filed for this visit.   Subjective Assessment - 03/12/21 1519     Subjective COVID-19 screen performed prior to patient entering clinic. Reports she feels good, BP has been good at home. Reports that she has already walked close to one mile today.    Pertinent History Pacemaker, right ankle fracture, OA, COPD, HTN, CHF, right hemi knee, peripheral neuropathy, atrial fib.    How long can you sit comfortably? Unlimited.    How long can you walk comfortably? Around home.    Patient Stated Goals Get stronger.    Currently in Pain? No/denies                [facility] PT Assessment - 03/12/21 0001  Assessment   Medical Diagnosis Weakness.    Referring Provider (PT) Evelina Dun    Next MD Visit 04/2021      Precautions   Precautions Fall    Precaution Comments Pacemaker.  Monitor vitals.  No valsalva maneuver.  Be midful of abdominal incision.      Restrictions   Weight Bearing Restrictions No                           [facility] Adult PT Treatment/Exercise - 03/12/21 0001       Knee/Hip Exercises: Aerobic   Nustep L4, seat 10 x16 min, SPM 86      Knee/Hip Exercises: Machines for Strengthening   Cybex Knee Extension 10# 3x10 reps    Cybex Knee Flexion 20# 3x10 reps      Knee/Hip Exercises: Standing   Heel Raises Both;2 sets;10 reps    Heel Raises Limitations B toe raise x20 reps    Hip Abduction Stengthening;Both;2 sets;10 reps;Knee straight      Knee/Hip Exercises: Seated   Sit to Sand 15 reps;without UE support                         PT  Long Term Goals - 03/07/21 1155       PT LONG TERM GOAL #1   Title Patient will be independent with HEP    Baseline Met 02/28/21    Time 6    Period Weeks    Status Achieved      PT LONG TERM GOAL #2   Title Solid 5/5 strength grade for U and LE's.    Time 6    Period Weeks    Status On-going      PT LONG TERM GOAL #3   Title Walk 1,000 feet without rest and no LOB.    Baseline ongoing LOB at times with prolong distance 03/07/21    Time 6    Period Weeks    Status On-going      PT LONG TERM GOAL #4   Title Perform a reciprocating stair gait with one railing.    Time 6    Period Weeks    Status On-going                   Plan - 03/12/21 1601     Clinical Impression Statement Patient presented in clinic with no complaints. Patient progressed to light machine strengthening with no complaints of pain. Patient did report some muscle fatigue throughout therex. Patient very happy with results from PT as she is much stronger than she was one month ago. Patient does use SPC for outdoor ambulation.    Personal Factors and Comorbidities Comorbidity 1;Comorbidity 2;Other    Comorbidities Pacemaker, right ankle fracture, OA, COPD, HTN, CHF, right hemi knee, peripheral neuropathy, atrial fib.    Examination-Activity Limitations Locomotion Level;Other    Examination-Participation Restrictions Other    Stability/Clinical Decision Making Stable/Uncomplicated    Rehab Potential Excellent    PT Frequency 2x / week    PT Duration 6 weeks    PT Treatment/Interventions ADLs/Self Care Home Management;Gait training;Therapeutic activities;Therapeutic exercise;Balance training;Neuromuscular re-education;Patient/family education    PT Next Visit Plan Gait and balance training.  General strengthening.    Consulted and Agree with Plan of Care Patient             Patient will benefit from skilled therapeutic intervention in order to improve the  following deficits and impairments:   Abnormal gait, Decreased activity tolerance, Decreased strength  Visit Diagnosis: Muscle weakness (generalized)  Acute pain of left knee     Problem List Patient Active Problem List   Diagnosis Date Noted   ABLA (acute blood loss anemia) 12/14/2020   Acute deep vein thrombosis (DVT) of lower extremity (HCC) 12/14/2020   Chronic anticoagulation 12/14/2020   Calcaneal bursitis 12/12/2020   Acute upper GI bleed 12/08/2020   Complete heart block ([place]) 11/09/2020   Osteoporosis 10/11/2019   Pacemaker 10/04/2019   Near syncope    Hyperlipidemia 06/23/2016   Colovesical fistula 06/12/2016   Atrial fibrillation with RVR ([place]) 06/12/2016   GERD (gastroesophageal reflux disease) 06/12/2016   Fibromyalgia 06/12/2016   Peripheral neuropathy 06/12/2016   Atrial fibrillation ([place]) 06/12/2016   S/P gastric bypass 01/22/2016   S/P splenectomy 01/22/2016   S/P cholecystectomy 01/22/2016   Hx of small bowel obstruction 01/22/2016   Asthma 10/19/2015   Generalized anxiety disorder 10/19/2015   Depression 10/19/2015   Insomnia 10/19/2015   Iron deficiency anemia 10/19/2015   Vitamin D deficiency 11/29/2014   Eczema 11/27/2014   Benign essential HTN 09/25/2014   Obesity (BMI 30-39.9) 09/25/2014   Chronic diastolic heart failure ([place]) 09/25/2014   Allergic rhinitis 02/28/2013    Standley Brooking, PTA 03/12/2021, 4:03 PM  [facility] [address] [place], [place], [address] Phone: [phone]   Fax:  [phone]  Name: Sabrina [name] MRN: [id number] Date of Birth: 29-Jul-1946

## 2021-03-14 ENCOUNTER — Other Ambulatory Visit: Payer: Self-pay

## 2021-03-14 ENCOUNTER — Ambulatory Visit: Payer: Medicare HMO | Admitting: Physical Therapy

## 2021-03-14 DIAGNOSIS — M25572 Pain in left ankle and joints of left foot: Secondary | ICD-10-CM | POA: Diagnosis not present

## 2021-03-14 DIAGNOSIS — M6281 Muscle weakness (generalized): Secondary | ICD-10-CM | POA: Diagnosis not present

## 2021-03-14 DIAGNOSIS — M25562 Pain in left knee: Secondary | ICD-10-CM | POA: Diagnosis not present

## 2021-03-14 DIAGNOSIS — M25662 Stiffness of left knee, not elsewhere classified: Secondary | ICD-10-CM | POA: Diagnosis not present

## 2021-03-14 NOTE — Therapy (Signed)
[facility] [place], [place], [address] Phone: [phone]   Fax:  [phone]  Physical Therapy Treatment  Patient Details  Name: Sabrina [name] MRN: [id number] Date of Birth: 08-19-1946 Referring Provider (PT): Evelina Dun   Encounter Date: 03/14/2021   PT End of Session - 03/14/21 1425     Visit Number 9    Number of Visits 12    Date for PT Re-Evaluation 04/01/21    Authorization Type PROGRESS NOTE AT 10TH VISIT.  KX MODIFIER AFTER 15 VISITS.    PT Start Time 0230    PT Stop Time 0312    PT Time Calculation (min) 42 min    Activity Tolerance Patient tolerated treatment well    Behavior During Therapy WFL for tasks assessed/performed             Past Medical History:  Diagnosis Date   Anemia    Ankle fracture, right    past hx. -"no surgery"   Anxiety    Asthma    CHF (congestive heart failure) ([place]) 2009   Chronic lower back pain    Collagen vascular disease (HCC)    COPD (chronic obstructive pulmonary disease) (HCC)    Depression    Fibromyalgia    GERD (gastroesophageal reflux disease)    Hyperlipidemia    Hypertension    Immature cataract of both eyes    Myocardial infarction ([place])    "I've had a light one; don't know when it happened" (08/27/2017)   Osteoarthritis    Peripheral neuropathy    legs and feet   Persistent atrial fibrillation (HCC)    Tubular adenoma of colon     Past Surgical History:  Procedure Laterality Date   APPENDECTOMY     AV NODE ABLATION N/A 08/27/2017   Procedure: AV NODE ABLATION;  Surgeon: Evans Lance, MD;  Location: [place] [facility];  Service: Cardiovascular;  Laterality: N/A;   CHOLECYSTECTOMY OPEN  1978   DILATION AND CURETTAGE OF UTERUS     FEMUR FRACTURE SURGERY Left 2013   "put 7" rod in it"   FRACTURE SURGERY     LAPAROSCOPY  08/22/2016   Procedure: LAPAROSCOPY DIAGNOSTIC;  Surgeon: [name], MD;  Location: [facility];  Service: General;;    MEDIAL PARTIAL KNEE REPLACEMENT Right 2005   "@ [facility]"   PACEMAKER IMPLANT N/A 08/27/2017   Procedure: PACEMAKER IMPLANT;  Surgeon: Evans Lance, MD;  Location: [place] [facility];  Service: Cardiovascular;  Laterality: N/A;   ROUX-EN-Y GASTRIC BYPASS  2002   [place]  2002   TONSILLECTOMY  1944   TUBAL LIGATION     VAGINAL HYSTERECTOMY      There were no vitals filed for this visit.   Subjective Assessment - 03/14/21 1425     Subjective COVID-19 screen performed prior to patient entering clinic. Patient arrived with no complaints, doing well with treatments and progressing with activity tolerance and vitals WNL    Pertinent History Pacemaker, right ankle fracture, OA, COPD, HTN, CHF, right hemi knee, peripheral neuropathy, atrial fib.    How long can you sit comfortably? Unlimited.    How long can you walk comfortably? Around home.    Patient Stated Goals Get stronger.    Currently in Pain? No/denies  [facility] Adult PT Treatment/Exercise - 03/14/21 0001       Knee/Hip Exercises: Aerobic   Nustep L4 x45mn UE/LE activity      Knee/Hip Exercises: Machines for Strengthening   Cybex Knee Extension 10# 3x10 reps    Cybex Knee Flexion 20# 3x10 reps      Knee/Hip Exercises: Standing   Hip Abduction Stengthening;Both;Knee straight   side stepping with yellow band bil sides for fatigue   Abduction Limitations .    Other Standing Knee Exercises standing step outs bil UE/LE 2x10    Other Standing Knee Exercises going up and down steps x2 reps with reciprocating gait      Shoulder Exercises: Standing   Row Strengthening;Both;Theraband;20 reps    Theraband Level (Shoulder Row) Other (comment)    Row Limitations green XTS      Shoulder Exercises: ROM/Strengthening   UBE (Upper Arm Bike) 120RPM x623m 3/3    Wall Pushups 20 reps                         PT Long Term Goals - 03/14/21 1445        PT LONG TERM GOAL #1   Title Patient will be independent with HEP    Baseline Met 02/28/21    Time 6    Period Weeks    Status Achieved      PT LONG TERM GOAL #2   Title Solid 5/5 strength grade for U and LE's.    Time 6    Period Weeks    Status On-going      PT LONG TERM GOAL #3   Title Walk 1,000 feet without rest and no LOB.    Baseline able to walk 1 block approx 200 feet and requires rest 03/14/21    Time 6    Period Weeks    Status On-going      PT LONG TERM GOAL #4   Title Perform a reciprocating stair gait with one railing.    Baseline able to perform today with ease 03/14/21    Time 6    Period Weeks    Status Achieved                   Plan - 03/14/21 1505     Clinical Impression Statement Patient tolerated treatment well today. Patient continues to improve with activity tolerance and is able to walk several blocks yet only approx 200 feet and requires rest. Today focused on UE and LE progression for strength and balance. Patient able to perfrom steps with reciprocating gait today. Patient met LTG #4 with othrs progressing.    Personal Factors and Comorbidities Comorbidity 1;Comorbidity 2;Other    Comorbidities Pacemaker, right ankle fracture, OA, COPD, HTN, CHF, right hemi knee, peripheral neuropathy, atrial fib.    Examination-Activity Limitations Locomotion Level;Other    Examination-Participation Restrictions Other    Stability/Clinical Decision Making Stable/Uncomplicated    Rehab Potential Excellent    PT Frequency 2x / week    PT Duration 6 weeks    PT Treatment/Interventions ADLs/Self Care Home Management;Gait training;Therapeutic activities;Therapeutic exercise;Balance training;Neuromuscular re-education;Patient/family education    PT Next Visit Plan Gait and balance training.  General strengthening.    Consulted and Agree with Plan of Care Patient             Patient will benefit from skilled therapeutic intervention in order to  improve the following deficits and impairments:  Abnormal gait, Decreased activity tolerance, Decreased  strength  Visit Diagnosis: Muscle weakness (generalized)     Problem List Patient Active Problem List   Diagnosis Date Noted   ABLA (acute blood loss anemia) 12/14/2020   Acute deep vein thrombosis (DVT) of lower extremity (HCC) 12/14/2020   Chronic anticoagulation 12/14/2020   Calcaneal bursitis 12/12/2020   Acute upper GI bleed 12/08/2020   Complete heart block ([place]) 11/09/2020   Osteoporosis 10/11/2019   Pacemaker 10/04/2019   Near syncope    Hyperlipidemia 06/23/2016   Colovesical fistula 06/12/2016   Atrial fibrillation with RVR ([place]) 06/12/2016   GERD (gastroesophageal reflux disease) 06/12/2016   Fibromyalgia 06/12/2016   Peripheral neuropathy 06/12/2016   Atrial fibrillation ([place]) 06/12/2016   S/P gastric bypass 01/22/2016   S/P splenectomy 01/22/2016   S/P cholecystectomy 01/22/2016   Hx of small bowel obstruction 01/22/2016   Asthma 10/19/2015   Generalized anxiety disorder 10/19/2015   Depression 10/19/2015   Insomnia 10/19/2015   Iron deficiency anemia 10/19/2015   Vitamin D deficiency 11/29/2014   Eczema 11/27/2014   Benign essential HTN 09/25/2014   Obesity (BMI 30-39.9) 09/25/2014   Chronic diastolic heart failure ([place]) 09/25/2014   Allergic rhinitis 02/28/2013    Evoleth Nordmeyer P, PTA 03/14/2021, 3:14 PM  [facility] [address] [place], [place], [address] Phone: [phone]   Fax:  [phone]  Name: Sabrina [name] MRN: [id number] Date of Birth: [birth year]

## 2021-03-18 ENCOUNTER — Ambulatory Visit: Payer: Medicare HMO | Admitting: Physical Therapy

## 2021-03-18 ENCOUNTER — Other Ambulatory Visit: Payer: Self-pay

## 2021-03-18 VITALS — BP 106/76 | HR 81

## 2021-03-18 DIAGNOSIS — M6281 Muscle weakness (generalized): Secondary | ICD-10-CM

## 2021-03-18 DIAGNOSIS — M25662 Stiffness of left knee, not elsewhere classified: Secondary | ICD-10-CM | POA: Diagnosis not present

## 2021-03-18 DIAGNOSIS — M25562 Pain in left knee: Secondary | ICD-10-CM | POA: Diagnosis not present

## 2021-03-18 DIAGNOSIS — M25572 Pain in left ankle and joints of left foot: Secondary | ICD-10-CM | POA: Diagnosis not present

## 2021-03-18 NOTE — Therapy (Signed)
[facility] [place], [place], [address] Phone: [phone]   Fax:  [phone]  Physical Therapy Treatment  Patient Details  Name: Sabrina [name] MRN: [id number] Date of Birth: 30-Oct-1945 Referring Provider (PT): Evelina Dun   Encounter Date: 03/18/2021   PT End of Session - 03/18/21 1416     Visit Number 10    Number of Visits 12    Date for PT Re-Evaluation 04/01/21    Authorization Type PROGRESS NOTE AT 10TH VISIT.  KX MODIFIER AFTER 15 VISITS.    PT Start Time 1425    PT Stop Time 1510    PT Time Calculation (min) 45 min    Activity Tolerance Patient tolerated treatment well    Behavior During Therapy WFL for tasks assessed/performed             Past Medical History:  Diagnosis Date   Anemia    Ankle fracture, right    past hx. -"no surgery"   Anxiety    Asthma    CHF (congestive heart failure) ([place]) 2009   Chronic lower back pain    Collagen vascular disease (HCC)    COPD (chronic obstructive pulmonary disease) (HCC)    Depression    Fibromyalgia    GERD (gastroesophageal reflux disease)    Hyperlipidemia    Hypertension    Immature cataract of both eyes    Myocardial infarction ([place])    "I've had a light one; don't know when it happened" (08/27/2017)   Osteoarthritis    Peripheral neuropathy    legs and feet   Persistent atrial fibrillation (HCC)    Tubular adenoma of colon     Past Surgical History:  Procedure Laterality Date   APPENDECTOMY     AV NODE ABLATION N/A 08/27/2017   Procedure: AV NODE ABLATION;  Surgeon: Evans Lance, MD;  Location: [facility] [facility];  Service: Cardiovascular;  Laterality: N/A;   CHOLECYSTECTOMY OPEN  1978   DILATION AND CURETTAGE OF UTERUS     FEMUR FRACTURE SURGERY Left 2013   "put 7" rod in it"   FRACTURE SURGERY     LAPAROSCOPY  08/22/2016   Procedure: LAPAROSCOPY DIAGNOSTIC;  Surgeon: [name], MD;  [facility];  Service: General;;    MEDIAL PARTIAL KNEE REPLACEMENT Right 2005   "@ [facility]"   PACEMAKER IMPLANT N/A 08/27/2017   Procedure: PACEMAKER IMPLANT;  Surgeon: Evans Lance, MD;  Location: [facility];  Service: Cardiovascular;  Laterality: N/A;   ROUX-EN-Y GASTRIC BYPASS  2002   [place]   TONSILLECTOMY  1944   TUBAL LIGATION     VAGINAL HYSTERECTOMY      Vitals:   03/18/21 1426  BP: 106/76  Pulse: 81  SpO2: 96%     Subjective Assessment - 03/18/21 1427     Subjective COVID-19 screen performed prior to patient entering clinic. Sees MD tomorrow regarding blood thinner and Watchman procedure. States that she did 4 blocks on Saturday.    Pertinent History Pacemaker, right ankle fracture, OA, COPD, HTN, CHF, right hemi knee, peripheral neuropathy, atrial fib.    How long can you sit comfortably? Unlimited.    How long can you walk comfortably? Around home.    Patient Stated Goals Get stronger.    Currently in Pain? No/denies                [facility] PT Assessment - 03/18/21 0001  Assessment   Medical Diagnosis Weakness.    Referring Provider (PT) Evelina Dun    Next MD Visit 04/2021      Precautions   Precautions Fall    Precaution Comments Pacemaker.  Monitor vitals.  No valsalva maneuver.  Be midful of abdominal incision.      Restrictions   Weight Bearing Restrictions No                           [facility] Adult PT Treatment/Exercise - 03/18/21 0001       Knee/Hip Exercises: Aerobic   Nustep L4 x20 min UE/LE activity      Knee/Hip Exercises: Machines for Strengthening   Cybex Knee Extension 10# 3x10 reps    Cybex Knee Flexion 20# 3x10 reps      Knee/Hip Exercises: Standing   Heel Raises Both;10 reps;3 sets    Heel Raises Limitations B toe raise x30 reps    Hip Flexion Stengthening;Both;15 reps;Knee bent;Limitations    Hip Flexion Limitations 3#    Hip Abduction Stengthening;Both;15 reps;Knee straight;Limitations     Abduction Limitations 3#      Knee/Hip Exercises: Seated   Sit to Sand 20 reps;without UE support                         PT Long Term Goals - 03/14/21 1445       PT LONG TERM GOAL #1   Title Patient will be independent with HEP    Baseline Met 02/28/21    Time 6    Period Weeks    Status Achieved      PT LONG TERM GOAL #2   Title Solid 5/5 strength grade for U and LE's.    Time 6    Period Weeks    Status On-going      PT LONG TERM GOAL #3   Title Walk 1,000 feet without rest and no LOB.    Baseline able to walk 1 block approx 200 feet and requires rest 03/14/21    Time 6    Period Weeks    Status On-going      PT LONG TERM GOAL #4   Title Perform a reciprocating stair gait with one railing.    Baseline able to perform today with ease 03/14/21    Time 6    Period Weeks    Status Achieved                   Plan - 03/18/21 1517     Clinical Impression Statement Patient presented in clinic with no complaints. Patient progressed through more long duration and reps to improve strength and activity endurance. Patient demonstrated more weakness of hip flexors or abductors with ankle weights. Patient very pleased with her new ability to sit <> stand without use of UE.    Personal Factors and Comorbidities Comorbidity 1;Comorbidity 2;Other    Comorbidities Pacemaker, right ankle fracture, OA, COPD, HTN, CHF, right hemi knee, peripheral neuropathy, atrial fib.    Examination-Activity Limitations Locomotion Level;Other    Examination-Participation Restrictions Other    Stability/Clinical Decision Making Stable/Uncomplicated    Rehab Potential Excellent    PT Frequency 2x / week    PT Duration 6 weeks    PT Treatment/Interventions ADLs/Self Care Home Management;Gait training;Therapeutic activities;Therapeutic exercise;Balance training;Neuromuscular re-education;Patient/family education    PT Next Visit Plan Gait and balance training.  General strengthening.     Consulted  and Agree with Plan of Care Patient             Patient will benefit from skilled therapeutic intervention in order to improve the following deficits and impairments:  Abnormal gait, Decreased activity tolerance, Decreased strength  Visit Diagnosis: Muscle weakness (generalized)     Problem List Patient Active Problem List   Diagnosis Date Noted   ABLA (acute blood loss anemia) 12/14/2020   Acute deep vein thrombosis (DVT) of lower extremity (HCC) 12/14/2020   Chronic anticoagulation 12/14/2020   Calcaneal bursitis 12/12/2020   Acute upper GI bleed 12/08/2020   Complete heart block ([place]) 11/09/2020   Osteoporosis 10/11/2019   Pacemaker 10/04/2019   Near syncope    Hyperlipidemia 06/23/2016   Colovesical fistula 06/12/2016   Atrial fibrillation with RVR ([place]) 06/12/2016   GERD (gastroesophageal reflux disease) 06/12/2016   Fibromyalgia 06/12/2016   Peripheral neuropathy 06/12/2016   Atrial fibrillation ([place]) 06/12/2016   S/P gastric bypass 01/22/2016   S/P splenectomy 01/22/2016   S/P cholecystectomy 01/22/2016   Hx of small bowel obstruction 01/22/2016   Asthma 10/19/2015   Generalized anxiety disorder 10/19/2015   Depression 10/19/2015   Insomnia 10/19/2015   Iron deficiency anemia 10/19/2015   Vitamin D deficiency 11/29/2014   Eczema 11/27/2014   Benign essential HTN 09/25/2014   Obesity (BMI 30-39.9) 09/25/2014   Chronic diastolic heart failure ([place]) 09/25/2014   Allergic rhinitis 02/28/2013    Standley Brooking, PTA 03/18/2021, 3:22 PM  [facility] [address] [place], [place], [address] Phone: [phone]   Fax:  [phone]  Name: ATIRA BORELLO MRN: [id number] Date of Birth: [birth year]  Progress Note Reporting Period 02/11/21 to 03/18/21.  See note below for Objective Data and Assessment of Progress/Goals.  Good progress with LTG's #1 and #4 met at this time.     Mali Applegate  MPT

## 2021-03-19 ENCOUNTER — Encounter: Payer: Self-pay | Admitting: Internal Medicine

## 2021-03-19 ENCOUNTER — Ambulatory Visit (INDEPENDENT_AMBULATORY_CARE_PROVIDER_SITE_OTHER): Payer: Medicare HMO | Admitting: Internal Medicine

## 2021-03-19 VITALS — BP 114/68 | HR 76 | Ht 64.0 in | Wt 157.0 lb

## 2021-03-19 DIAGNOSIS — I4821 Permanent atrial fibrillation: Secondary | ICD-10-CM | POA: Diagnosis not present

## 2021-03-19 DIAGNOSIS — Z950 Presence of cardiac pacemaker: Secondary | ICD-10-CM

## 2021-03-19 DIAGNOSIS — I442 Atrioventricular block, complete: Secondary | ICD-10-CM | POA: Diagnosis not present

## 2021-03-19 NOTE — Progress Notes (Addendum)
HPI Sabrina [name] returns today for followup. She is a pleasant 75yo woman with a h/o sinus node dysfunction, s/p PPM insertion. She has a h/o morbid obesity and has started diet and exercise therapy. She has lost 42 lbs in 3 years. She has had an episode of GI bleeding followed by a DVT requiring a filter placement. She also now has chronic atrial fib. Her [place] has been stopped due to life threatening bleeding. Allergies  Allergen Reactions   Codeine Nausea And Vomiting    Stomach pain also     Current Outpatient Medications  Medication Sig Dispense Refill   albuterol (VENTOLIN HFA) 108 (90 Base) MCG/ACT inhaler TAKE 2 PUFFS BY MOUTH EVERY 6 HOURS AS NEEDED FOR WHEEZE OR SHORTNESS OF BREATH 18 g 0   alendronate (FOSAMAX) 70 MG tablet Take 1 tablet (70 mg total) by mouth every 7 (seven) days. Take with a full glass of water on an empty stomach. 12 tablet 0   ALPRAZolam (XANAX) 0.5 MG tablet Take 0.5 mg by mouth at bedtime as needed for anxiety.     amitriptyline (ELAVIL) 75 MG tablet Take 1 tablet (75 mg total) by mouth at bedtime. 90 tablet 1   atorvastatin (LIPITOR) 20 MG tablet Take 1 tablet (20 mg total) by mouth daily. 90 tablet 3   calcium carbonate (OSCAL) 1500 (600 Ca) MG TABS tablet Take by mouth daily with breakfast.     DULoxetine (CYMBALTA) 60 MG capsule TAKE 1 CAPSULE EVERY DAY 90 capsule 0   ferrous sulfate 325 (65 FE) MG tablet Take 325 mg by mouth daily.     fluticasone (FLONASE) 50 MCG/ACT nasal spray SPRAY 2 SPRAYS INTO EACH NOSTRIL EVERY DAY 48 g 1   furosemide (LASIX) 20 MG tablet Take 10 mg by mouth daily.     gabapentin (NEURONTIN) 100 MG capsule Take 1 capsule (100 mg total) by mouth 2 (two) times daily. Needs to be seen 180 capsule 0   Multiple Vitamins-Minerals (ONE-A-DAY 50 PLUS PO) Take 1 tablet by mouth daily.     Omega-3 Fatty Acids (FISH OIL PO) Take 1 capsule by mouth daily.     pantoprazole (PROTONIX) 40 MG tablet Take 40 mg by mouth 2 (two) times  daily.     potassium chloride SA (KLOR-CON) 20 MEQ tablet Take 1 tablet (20 mEq total) by mouth daily. 90 tablet 2   PRESCRIPTION MEDICATION Apply 1 application topically daily as needed (excema). HAL Ointment     SYMBICORT 80-4.5 MCG/ACT inhaler Inhale 2 puffs into the lungs 2 (two) times daily.     triamcinolone ointment (KENALOG) 0.5 % Apply 1 application topically 2 (two) times daily. 30 g 0   Vitamin D, Ergocalciferol, (DRISDOL) 1.25 MG (50000 UNIT) CAPS capsule Take 1 capsule (50,000 Units total) by mouth every 7 (seven) days. 12 capsule 3   No current facility-administered medications for this visit.     Past Medical History:  Diagnosis Date   Anemia    Ankle fracture, right    past hx. -"no surgery"   Anxiety    Asthma    CHF (congestive heart failure) ([place]) 2009   Chronic lower back pain    Collagen vascular disease (HCC)    COPD (chronic obstructive pulmonary disease) (HCC)    Depression    Fibromyalgia    GERD (gastroesophageal reflux disease)    Hyperlipidemia    Hypertension    Immature cataract of both eyes    Myocardial infarction (  [place])    "I've had a light one; don't know when it happened" (08/27/2017)   Osteoarthritis    Peripheral neuropathy    legs and feet   Persistent atrial fibrillation (HCC)    Tubular adenoma of colon     ROS:   All systems reviewed and negative except as noted in the HPI.   Past Surgical History:  Procedure Laterality Date   APPENDECTOMY     AV NODE ABLATION N/A 08/27/2017   Procedure: AV NODE ABLATION;  Surgeon: Evans Lance, MD;  Location: [place] [facility];  Service: Cardiovascular;  Laterality: N/A;   CHOLECYSTECTOMY OPEN  1978   DILATION AND CURETTAGE OF UTERUS     FEMUR FRACTURE SURGERY Left 2013   "put 7" rod in it"   FRACTURE SURGERY     LAPAROSCOPY  08/22/2016   Procedure: LAPAROSCOPY DIAGNOSTIC;  Surgeon: [name], MD;  [facility];  Service: General;;   MEDIAL PARTIAL KNEE REPLACEMENT Right 2005    "@ [facility]"   PACEMAKER IMPLANT N/A 08/27/2017   Procedure: PACEMAKER IMPLANT;  Surgeon: Evans Lance, MD;  Location: [place] [facility];  Service: Cardiovascular;  Laterality: N/A;   ROUX-EN-Y GASTRIC BYPASS  2002   [place]  2002   TONSILLECTOMY  1944   TUBAL LIGATION     VAGINAL HYSTERECTOMY       Family History  Problem Relation Age of Onset   Cancer Mother    Alzheimer's disease Mother    Heart disease Mother    Arthritis Father    Colon cancer Paternal Uncle 70   Asthma Daughter    Arthritis Daughter    Obesity Daughter    Arthritis Son    Hyperlipidemia Son    Obesity Son    Arthritis Son    Obesity Son      Social History   Socioeconomic History   Marital status: Divorced    Spouse name: Not on file   Number of children: 3   Years of education: 2 years of college   Highest education level: Some college, no degree  Occupational History   Occupation: Retired  Tobacco Use   Smoking status: Former    Packs/day: 0.50    Years: 25.00    Pack years: 12.50    Types: Cigarettes    Quit date: 02/26/1993    Years since quitting: 28.0   Smokeless tobacco: Never  Vaping Use   Vaping Use: Never used  Substance and Sexual Activity   Alcohol use: Not Currently    Comment: 08/27/2017 "nothing since early 2000s"   Drug use: Not Currently   Sexual activity: Not Currently    Birth control/protection: Surgical  Other Topics Concern   Not on file  Social History Narrative   ** Merged History Encounter **       Pt is right handed Lives in single story home with her grandson Has 3 adult children Associated degree  Retired Quarry manager   Social Determinants of Radio broadcast assistant Strain: Low Risk    Difficulty of Paying Living Expenses: Not very hard  Food Insecurity: No Food Insecurity   Worried About Charity fundraiser in the Last Year: Never true   Arboriculturist in the Last Year: Never true  Transportation Needs: No  Transportation Needs   Lack of Transportation (Medical): No   Lack of Transportation (Non-Medical): No  Physical Activity: Sufficiently Active   Days  of Exercise per Week: 3 days   Minutes of Exercise per Session: 60 min  Stress: Stress Concern Present   Feeling of Stress : To some extent  Social Connections: Moderately Integrated   Frequency of Communication with Friends and Family: More than three times a week   Frequency of Social Gatherings with Friends and Family: Twice a week   Attends Religious Services: 1 to 4 times per year   Active Member of [employer] or Organizations: Yes   Attends Archivist Meetings: 1 to 4 times per year   Marital Status: Divorced  Human resources officer Violence: Not At Risk   Fear of Current or Ex-Partner: No   Emotionally Abused: No   Physically Abused: No   Sexually Abused: No     BP 114/68   Pulse 76   Ht 5\' 4"  (1.626 m)   Wt 157 lb (71.2 kg)   SpO2 99%   BMI 26.95 kg/m   Physical Exam:  Well appearing NAD HEENT: Unremarkable Neck:  No JVD, no thyromegally Lymphatics:  No adenopathy Back:  No CVA tenderness Lungs:  Clear with no wheezes HEART:  Regular rate rhythm, no murmurs, no rubs, no clicks Abd:  soft, positive bowel sounds, no organomegally, no rebound, no guarding Ext:  2 plus pulses, no edema, no cyanosis, no clubbing Skin:  No rashes no nodules Neuro:  CN II through XII intact, motor grossly intact  EKG - atrial fib with ventricular pacing.  DEVICE  Normal device function.  See PaceArt for details.   Assess/Plan:  Atrial fib - her rates are well controlled. She is concerned about having a stroke. I have recommended she undergo eval for a Watchman. She will see Dr. Quentin Ore. I explained that she might have to take a blood thinner for several weeks prior to watchman insertion. PPM - her medtronic DDD PM is working normally. She is pacing in her His bundle. HTN - her bp is well controlled. We will follow. Obesity - she has  lost 42 lbs. No indication for more weight loss.  Sabrina Overlie Shivon Hackel,MD  Addendum: I have seen Sabrina [name] is a 75 y.o. female in the office today. The patient is felt to be a poor candidate for long-term anticoagulation because of lifethreatening bleeding.  Their CHADS-2-Vasc Score and unadjusted Ischemic Stroke Rate (% per year) is equal to 4.8 % stroke rate/year from a score of 4, necessitating a strategy of stroke prevention with either long-term oral anticoagulation or left atrial appendage occlusion therapy. We have discussed their bleeding risk in the context of their comorbid medical problems, as well as the rationale for referral for evaluation of Watchman left atrial appendage occlusion therapy. While the patient is at high long-term bleeding risk, they may be appropriate for short-term anticoagulation. Based on this individual patient's stroke and bleeding risk, a shared decision has been made to refer the patient for consideration of Watchman left atrial appendage closure utilizing the [employer] of Cardiology shared decision tool.

## 2021-03-19 NOTE — Patient Instructions (Addendum)
Medication Instructions:  Your physician recommends that you continue on your current medications as directed. Please refer to the Current Medication list given to you today.  Labwork: You will get lab work today:  CBC  Testing/Procedures: None ordered.  Follow-Up:  You are being referred to Dr. Quentin Ore to consider the Watchman procedure.  Your physician wants you to follow-up in: one year with [name], MD or one of the following Advanced Practice Providers on your designated Care Team:   Tommye Standard, [place] Legrand Como "Jonni Sanger" Chalmers Cater, [place]  Remote monitoring is used to monitor your Pacemaker from home. This monitoring reduces the number of office visits required to check your device to one time per year. It allows [place] to keep an eye on the functioning of your device to ensure it is working properly. You are scheduled for a device check from home on 03/29/2021. You may send your transmission at any time that day. If you have a wireless device, the transmission will be sent automatically. After your physician reviews your transmission, you will receive a postcard with your next transmission date.  Any Other Special Instructions Will Be Listed Below (If Applicable).  If you need a refill on your cardiac medications before your next appointment, please call your pharmacy.

## 2021-03-20 LAB — CBC WITH DIFFERENTIAL/PLATELET
Basophils Absolute: 0.1 10*3/uL (ref 0.0–0.2)
Basos: 1 %
EOS (ABSOLUTE): 0.3 10*3/uL (ref 0.0–0.4)
Eos: 3 %
Hematocrit: 37.2 % (ref 34.0–46.6)
Hemoglobin: 11.8 g/dL (ref 11.1–15.9)
Immature Grans (Abs): 0 10*3/uL (ref 0.0–0.1)
Immature Granulocytes: 0 %
Lymphocytes Absolute: 2.3 10*3/uL (ref 0.7–3.1)
Lymphs: 26 %
MCH: 26.6 pg (ref 26.6–33.0)
MCHC: 31.7 g/dL (ref 31.5–35.7)
MCV: 84 fL (ref 79–97)
Monocytes Absolute: 0.7 10*3/uL (ref 0.1–0.9)
Monocytes: 8 %
Neutrophils Absolute: 5.5 10*3/uL (ref 1.4–7.0)
Neutrophils: 62 %
Platelets: 350 10*3/uL (ref 150–450)
RBC: 4.43 x10E6/uL (ref 3.77–5.28)
RDW: 14.7 % (ref 11.7–15.4)
WBC: 8.9 10*3/uL (ref 3.4–10.8)

## 2021-03-21 ENCOUNTER — Encounter: Payer: Self-pay | Admitting: Physical Therapy

## 2021-03-21 ENCOUNTER — Other Ambulatory Visit: Payer: Self-pay

## 2021-03-21 ENCOUNTER — Ambulatory Visit: Payer: Medicare HMO | Admitting: Physical Therapy

## 2021-03-21 DIAGNOSIS — M25662 Stiffness of left knee, not elsewhere classified: Secondary | ICD-10-CM | POA: Diagnosis not present

## 2021-03-21 DIAGNOSIS — M6281 Muscle weakness (generalized): Secondary | ICD-10-CM

## 2021-03-21 DIAGNOSIS — M25572 Pain in left ankle and joints of left foot: Secondary | ICD-10-CM | POA: Diagnosis not present

## 2021-03-21 DIAGNOSIS — M25562 Pain in left knee: Secondary | ICD-10-CM | POA: Diagnosis not present

## 2021-03-21 NOTE — Therapy (Signed)
[facility] [place], [place], [address] Phone: [phone]   Fax:  [phone]  Physical Therapy Treatment  Patient Details  Name: Sabrina [name] MRN: [id number] Date of Birth: 05/18/1946 Referring Provider (PT): Evelina Dun   Encounter Date: 03/21/2021   PT End of Session - 03/21/21 1447     Visit Number 11    Number of Visits 12    Date for PT Re-Evaluation 04/01/21    Authorization Type PROGRESS NOTE AT 10TH VISIT.  KX MODIFIER AFTER 15 VISITS.    PT Start Time 7628    PT Stop Time 1515    PT Time Calculation (min) 44 min    Activity Tolerance Patient tolerated treatment well    Behavior During Therapy WFL for tasks assessed/performed             Past Medical History:  Diagnosis Date   Anemia    Ankle fracture, right    past hx. -"no surgery"   Anxiety    Asthma    CHF (congestive heart failure) ([place]) 2009   Chronic lower back pain    Collagen vascular disease (HCC)    COPD (chronic obstructive pulmonary disease) (HCC)    Depression    Fibromyalgia    GERD (gastroesophageal reflux disease)    Hyperlipidemia    Hypertension    Immature cataract of both eyes    Myocardial infarction ([place])    "I've had a light one; don't know when it happened" (08/27/2017)   Osteoarthritis    Peripheral neuropathy    legs and feet   Persistent atrial fibrillation (HCC)    Tubular adenoma of colon     Past Surgical History:  Procedure Laterality Date   APPENDECTOMY     AV NODE ABLATION N/A 08/27/2017   Procedure: AV NODE ABLATION;  Surgeon: Evans Lance, MD;  Location: [facility];  Service: Cardiovascular;  Laterality: N/A;   CHOLECYSTECTOMY OPEN  1978   DILATION AND CURETTAGE OF UTERUS     FEMUR FRACTURE SURGERY Left 2013   "put 7" rod in it"   FRACTURE SURGERY     LAPAROSCOPY  08/22/2016   Procedure: LAPAROSCOPY DIAGNOSTIC;  Surgeon: [name], MD;  Location: [facility];  Service: General;;    MEDIAL PARTIAL KNEE REPLACEMENT Right 2005   "@ [facility]"   PACEMAKER IMPLANT N/A 08/27/2017   Procedure: PACEMAKER IMPLANT;  Surgeon: Evans Lance, MD;  Location: [facility];  Service: Cardiovascular;  Laterality: N/A;   ROUX-EN-Y GASTRIC BYPASS  2002   [place]  2002   TONSILLECTOMY  1944   TUBAL LIGATION     VAGINAL HYSTERECTOMY      There were no vitals filed for this visit.   Subjective Assessment - 03/21/21 1435     Subjective COVID-19 screen performed prior to patient entering clinic. To have Watchman implanted in August 2022 but MD was very pleased with her progress.    Pertinent History Pacemaker, right ankle fracture, OA, COPD, HTN, CHF, right hemi knee, peripheral neuropathy, atrial fib.    How long can you sit comfortably? Unlimited.    How long can you walk comfortably? Around home.    Patient Stated Goals Get stronger.    Currently in Pain? No/denies                [facility] PT Assessment - 03/21/21 0001       Assessment   Medical  Diagnosis Weakness.    Referring Provider (PT) Evelina Dun    Next MD Visit 04/2021      Precautions   Precautions Fall    Precaution Comments Pacemaker.  Monitor vitals.  No valsalva maneuver.  Be midful of abdominal incision.                           [facility] Adult PT Treatment/Exercise - 03/21/21 0001       Knee/Hip Exercises: Aerobic   Nustep L4 x20 min UE/LE activity      Knee/Hip Exercises: Machines for Strengthening   Cybex Knee Extension 10# 3x10 reps    Cybex Knee Flexion 30# 3x10 reps      Knee/Hip Exercises: Standing   Heel Raises Both;10 reps;2 sets    Heel Raises Limitations B toe raise x20 reps    Hip Abduction Stengthening;Both;15 reps;Knee straight;Limitations    Functional Squat 15 reps;3 seconds      Knee/Hip Exercises: Seated   Clamshell with TheraBand Blue   x30 reps                        PT Long Term Goals - 03/14/21 1445        PT LONG TERM GOAL #1   Title Patient will be independent with HEP    Baseline Met 02/28/21    Time 6    Period Weeks    Status Achieved      PT LONG TERM GOAL #2   Title Solid 5/5 strength grade for U and LE's.    Time 6    Period Weeks    Status On-going      PT LONG TERM GOAL #3   Title Walk 1,000 feet without rest and no LOB.    Baseline able to walk 1 block approx 200 feet and requires rest 03/14/21    Time 6    Period Weeks    Status On-going      PT LONG TERM GOAL #4   Title Perform a reciprocating stair gait with one railing.    Baseline able to perform today with ease 03/14/21    Time 6    Period Weeks    Status Achieved                   Plan - 03/21/21 1531     Clinical Impression Statement Patient presented in clinic with reports of MD approval of the improvement of her overall strength. Will have Watchman procedure completed in August with an anticipated return to PT afterwards. Patient was, as precaution, given a fall necklace. Patient progressed through more strengthening exercises which were slightly limited due to LBP which she stated had flared within the last few days.    Personal Factors and Comorbidities Comorbidity 1;Comorbidity 2;Other    Comorbidities Pacemaker, right ankle fracture, OA, COPD, HTN, CHF, right hemi knee, peripheral neuropathy, atrial fib.    Examination-Activity Limitations Locomotion Level;Other    Examination-Participation Restrictions Other    Stability/Clinical Decision Making Stable/Uncomplicated    Rehab Potential Excellent    PT Frequency 2x / week    PT Duration 6 weeks    PT Treatment/Interventions ADLs/Self Care Home Management;Gait training;Therapeutic activities;Therapeutic exercise;Balance training;Neuromuscular re-education;Patient/family education    PT Next Visit Plan D/C    Consulted and Agree with Plan of Care Patient             Patient will benefit from  skilled therapeutic intervention in order  to improve the following deficits and impairments:  Abnormal gait, Decreased activity tolerance, Decreased strength  Visit Diagnosis: Muscle weakness (generalized)     Problem List Patient Active Problem List   Diagnosis Date Noted   ABLA (acute blood loss anemia) 12/14/2020   Acute deep vein thrombosis (DVT) of lower extremity (HCC) 12/14/2020   Chronic anticoagulation 12/14/2020   Calcaneal bursitis 12/12/2020   Acute upper GI bleed 12/08/2020   Complete heart block ([place]) 11/09/2020   Osteoporosis 10/11/2019   Pacemaker 10/04/2019   Near syncope    Hyperlipidemia 06/23/2016   Colovesical fistula 06/12/2016   Atrial fibrillation with RVR ([place]) 06/12/2016   GERD (gastroesophageal reflux disease) 06/12/2016   Fibromyalgia 06/12/2016   Peripheral neuropathy 06/12/2016   Atrial fibrillation ([place]) 06/12/2016   S/P gastric bypass 01/22/2016   S/P splenectomy 01/22/2016   S/P cholecystectomy 01/22/2016   Hx of small bowel obstruction 01/22/2016   Asthma 10/19/2015   Generalized anxiety disorder 10/19/2015   Depression 10/19/2015   Insomnia 10/19/2015   Iron deficiency anemia 10/19/2015   Vitamin D deficiency 11/29/2014   Eczema 11/27/2014   Benign essential HTN 09/25/2014   Obesity (BMI 30-39.9) 09/25/2014   Chronic diastolic heart failure ([place]) 09/25/2014   Allergic rhinitis 02/28/2013    Standley Brooking, PTA 03/21/2021, 3:34 PM  [facility] [address] [place], [place], [address] Phone: [phone]   Fax:  [phone]  Name: Sabrina [name] MRN: [id number] Date of Birth: 30-Apr-1946

## 2021-03-28 ENCOUNTER — Other Ambulatory Visit: Payer: Self-pay

## 2021-03-28 ENCOUNTER — Ambulatory Visit: Payer: Medicare HMO | Attending: Family | Admitting: Physical Therapy

## 2021-03-28 DIAGNOSIS — M6281 Muscle weakness (generalized): Secondary | ICD-10-CM | POA: Insufficient documentation

## 2021-03-28 NOTE — Therapy (Signed)
[facility] [place], [place], [address] Phone: [phone]   Fax:  [phone]  Physical Therapy Treatment  Patient Details  Name: Sabrina [name] MRN: [id number] Date of Birth: 07/23/1946 Referring Provider (PT): Evelina Dun   Encounter Date: 03/28/2021   PT End of Session - 03/28/21 1433     Visit Number 12    Number of Visits 12    Date for PT Re-Evaluation 04/01/21    Authorization Type PROGRESS NOTE AT 10TH VISIT.  KX MODIFIER AFTER 15 VISITS.    PT Start Time 0230    PT Stop Time 0312    PT Time Calculation (min) 42 min    Activity Tolerance Patient tolerated treatment well    Behavior During Therapy WFL for tasks assessed/performed             Past Medical History:  Diagnosis Date   Anemia    Ankle fracture, right    past hx. -"no surgery"   Anxiety    Asthma    CHF (congestive heart failure) ([place]) 2009   Chronic lower back pain    Collagen vascular disease (HCC)    COPD (chronic obstructive pulmonary disease) (HCC)    Depression    Fibromyalgia    GERD (gastroesophageal reflux disease)    Hyperlipidemia    Hypertension    Immature cataract of both eyes    Myocardial infarction ([place])    "I've had a light one; don't know when it happened" (08/27/2017)   Osteoarthritis    Peripheral neuropathy    legs and feet   Persistent atrial fibrillation (HCC)    Tubular adenoma of colon     Past Surgical History:  Procedure Laterality Date   APPENDECTOMY     AV NODE ABLATION N/A 08/27/2017   Procedure: AV NODE ABLATION;  Surgeon: Evans Lance, MD;  Location: [facility];  Service: Cardiovascular;  Laterality: N/A;   CHOLECYSTECTOMY OPEN  1978   DILATION AND CURETTAGE OF UTERUS     FEMUR FRACTURE SURGERY Left 2013   "put 7" rod in it"   FRACTURE SURGERY     LAPAROSCOPY  08/22/2016   Procedure: LAPAROSCOPY DIAGNOSTIC;  Surgeon: [name], MD;  [facility]: [facility];  Service: General;;    MEDIAL PARTIAL KNEE REPLACEMENT Right 2005   "@ [facility]"   PACEMAKER IMPLANT N/A 08/27/2017   Procedure: PACEMAKER IMPLANT;  Surgeon: Evans Lance, MD;  Location: [facility];  Service: Cardiovascular;  Laterality: N/A;   ROUX-EN-Y GASTRIC BYPASS  2002   [place]  2002   TONSILLECTOMY  1944   TUBAL LIGATION     VAGINAL HYSTERECTOMY      There were no vitals filed for this visit.   Subjective Assessment - 03/28/21 1433     Subjective COVID-19 screen performed prior to patient entering clinic. Patient arrived doing well today.    Pertinent History Pacemaker, right ankle fracture, OA, COPD, HTN, CHF, right hemi knee, peripheral neuropathy, atrial fib.    How long can you sit comfortably? Unlimited.    How long can you walk comfortably? Around home.    Patient Stated Goals Get stronger.    Currently in Pain? No/denies                [facility] PT Assessment - 03/28/21 0001       Strength   Strength Assessment Site Shoulder;Knee;Hip    Right/Left Shoulder Left;Right  Right Shoulder Flexion 5/5    Right Shoulder Internal Rotation 5/5    Right Shoulder External Rotation 4+/5    Left Shoulder Flexion 5/5    Left Shoulder Internal Rotation 5/5    Left Shoulder External Rotation 4+/5    Right/Left Hip Right;Left    Right Hip ABduction 4+/5    Left Hip Flexion 5/5    Left Hip ABduction 4+/5    Right/Left Knee Right;Left    Right Knee Flexion 4/5    Right Knee Extension 5/5    Left Knee Flexion 4+/5    Left Knee Extension 5/5                           [facility] Adult PT Treatment/Exercise - 03/28/21 0001       Knee/Hip Exercises: Aerobic   Nustep L4 x20 min UE/LE activity      Knee/Hip Exercises: Machines for Strengthening   Cybex Knee Extension 10# 3x10 reps    Cybex Knee Flexion 30# 3x10 reps      Knee/Hip Exercises: Standing   Heel Raises Both;10 reps;2 sets    Heel Raises Limitations B toe raise x20 reps    Hip  Extension Stengthening;Both;15 reps;Knee straight      Shoulder Exercises: ROM/Strengthening   UBE (Upper Arm Bike) 120RPM x16mn 3/3                         PT Long Term Goals - 03/28/21 1434       PT LONG TERM GOAL #1   Title Patient will be independent with HEP    Baseline Met 02/28/21    Time 6    Period Weeks    Status Achieved      PT LONG TERM GOAL #2   Title Solid 5/5 strength grade for U and LE's.    Baseline see assessment    Time 6    Period Weeks    Status Partially Met      PT LONG TERM GOAL #3   Title Walk 1,000 feet without rest and no LOB.    Baseline walking 1 block approx 200 feet and requires rest 03/28/21    Time 6    Period Weeks    Status Not Met      PT LONG TERM GOAL #4   Title Perform a reciprocating stair gait with one railing.    Baseline able to perform today with ease 03/14/21    Time 6    Period Weeks    Status Achieved                   Plan - 03/28/21 1506     Clinical Impression Statement Patient tolerated treatment well today. Patient improved strength in UE and LE's overall with minimal deficts. Patient has improved activity tolerance and able to walk more yet limited to a few blocks. Met some goals with others improved. DC today per patient and PT as she is having a medical procedure in a few weeks. Patient to join [employer] to maintain activity and exercise.    Personal Factors and Comorbidities Comorbidity 1;Comorbidity 2;Other    Comorbidities Pacemaker, right ankle fracture, OA, COPD, HTN, CHF, right hemi knee, peripheral neuropathy, atrial fib.    Examination-Activity Limitations Locomotion Level;Other    Examination-Participation Restrictions Other    Stability/Clinical Decision Making Stable/Uncomplicated    Rehab Potential Excellent    PT Frequency 2x /  week    PT Duration 6 weeks    PT Treatment/Interventions ADLs/Self Care Home Management;Gait training;Therapeutic activities;Therapeutic exercise;Balance  training;Neuromuscular re-education;Patient/family education    PT Next Visit Plan D/C    Consulted and Agree with Plan of Care Patient             Patient will benefit from skilled therapeutic intervention in order to improve the following deficits and impairments:  Abnormal gait, Decreased activity tolerance, Decreased strength  Visit Diagnosis: Muscle weakness (generalized)     Problem List Patient Active Problem List   Diagnosis Date Noted   ABLA (acute blood loss anemia) 12/14/2020   Acute deep vein thrombosis (DVT) of lower extremity (HCC) 12/14/2020   Chronic anticoagulation 12/14/2020   Calcaneal bursitis 12/12/2020   Acute upper GI bleed 12/08/2020   Complete heart block ([place]) 11/09/2020   Osteoporosis 10/11/2019   Pacemaker 10/04/2019   Near syncope    Hyperlipidemia 06/23/2016   Colovesical fistula 06/12/2016   Atrial fibrillation with RVR ([place]) 06/12/2016   GERD (gastroesophageal reflux disease) 06/12/2016   Fibromyalgia 06/12/2016   Peripheral neuropathy 06/12/2016   Atrial fibrillation ([place]) 06/12/2016   S/P gastric bypass 01/22/2016   S/P splenectomy 01/22/2016   S/P cholecystectomy 01/22/2016   Hx of small bowel obstruction 01/22/2016   Asthma 10/19/2015   Generalized anxiety disorder 10/19/2015   Depression 10/19/2015   Insomnia 10/19/2015   Iron deficiency anemia 10/19/2015   Vitamin D deficiency 11/29/2014   Eczema 11/27/2014   Benign essential HTN 09/25/2014   Obesity (BMI 30-39.9) 09/25/2014   Chronic diastolic heart failure ([place]) 09/25/2014   Allergic rhinitis 02/28/2013   Ladean Raya, PTA 03/28/21 3:12 PM   [facility] [place], [place], [address] Phone: [phone]   Fax:  [phone]  Name: MEGHANA TULLO MRN: [id number] Date of Birth: [birth year]   PHYSICAL THERAPY DISCHARGE SUMMARY  Visits from Start of Care: 12  Current functional level related to goals /  functional outcomes: See above.    Remaining deficits: See goal section.   Education / Equipment: HEP.   Patient agrees to discharge. Patient goals were partially met. Patient is being discharged due to being pleased with the current functional level.    Mali Applegate MPT

## 2021-03-29 ENCOUNTER — Ambulatory Visit (INDEPENDENT_AMBULATORY_CARE_PROVIDER_SITE_OTHER): Payer: Medicare HMO

## 2021-03-29 DIAGNOSIS — I442 Atrioventricular block, complete: Secondary | ICD-10-CM | POA: Diagnosis not present

## 2021-03-31 LAB — CUP PACEART REMOTE DEVICE CHECK
Battery Remaining Longevity: 70 mo
Battery Voltage: 2.9 V
Brady Statistic AP VP Percent: 0.15 %
Brady Statistic AP VS Percent: 99.64 %
Brady Statistic AS VP Percent: 0 %
Brady Statistic AS VS Percent: 0.22 %
Brady Statistic RA Percent Paced: 100 %
Brady Statistic RV Percent Paced: 0.15 %
Date Time Interrogation Session: 20220708020922
Implantable Lead Implant Date: 20181206
Implantable Lead Implant Date: 20181206
Implantable Lead Location: 753860
Implantable Lead Location: 753860
Implantable Lead Model: 3830
Implantable Lead Model: 5076
Implantable Pulse Generator Implant Date: 20181206
Lead Channel Impedance Value: 266 Ohm
Lead Channel Impedance Value: 342 Ohm
Lead Channel Impedance Value: 361 Ohm
Lead Channel Impedance Value: 494 Ohm
Lead Channel Sensing Intrinsic Amplitude: 4.375 mV
Lead Channel Sensing Intrinsic Amplitude: 4.5 mV
Lead Channel Sensing Intrinsic Amplitude: 4.5 mV
Lead Channel Sensing Intrinsic Amplitude: 7.5 mV
Lead Channel Setting Pacing Amplitude: 2 V
Lead Channel Setting Pacing Amplitude: 2.5 V
Lead Channel Setting Pacing Pulse Width: 0.3 ms
Lead Channel Setting Sensing Sensitivity: 2 mV

## 2021-04-19 NOTE — Progress Notes (Signed)
Remote pacemaker transmission.   

## 2021-05-02 ENCOUNTER — Other Ambulatory Visit: Payer: Self-pay | Admitting: Family

## 2021-05-02 DIAGNOSIS — F411 Generalized anxiety disorder: Secondary | ICD-10-CM

## 2021-05-06 ENCOUNTER — Other Ambulatory Visit: Payer: Self-pay

## 2021-05-06 ENCOUNTER — Encounter: Payer: Self-pay | Admitting: Cardiology

## 2021-05-06 ENCOUNTER — Ambulatory Visit (INDEPENDENT_AMBULATORY_CARE_PROVIDER_SITE_OTHER): Payer: Medicare HMO | Admitting: Cardiology

## 2021-05-06 VITALS — BP 124/82 | HR 82 | Ht 64.0 in | Wt 157.0 lb

## 2021-05-06 DIAGNOSIS — I4891 Unspecified atrial fibrillation: Secondary | ICD-10-CM

## 2021-05-06 DIAGNOSIS — R600 Localized edema: Secondary | ICD-10-CM | POA: Diagnosis not present

## 2021-05-06 MED ORDER — FUROSEMIDE 20 MG PO TABS: 10.0000 mg | ORAL_TABLET | Freq: Every day | ORAL | 3 refills | Status: DC

## 2021-05-06 NOTE — Patient Instructions (Addendum)
Medication Instructions:  Change your Lasix to '10mg'$  daily - may take an extra 1/2 tab ('10mg'$ ) as needed for swelling or 3lb weight gain. Continue all other medications.     Labwork: none  Testing/Procedures: none  Follow-Up: 3 months   Any Other Special Instructions Will Be Listed Below (If Applicable).    If you need a refill on your cardiac medications before your next appointment, please call your pharmacy.

## 2021-05-06 NOTE — Progress Notes (Signed)
Clinical Summary Sabrina [name] is a 75 y.o.female former patient of Dr Bronson Ing, this is our first visit together.  1.Persistent afib - s/p av nodal ablation and pacemaker - has been on eliquis for stroke prevention - off anticoag due to GI bleeding, from last EP note referred for Watchman consideration  03/2021 normal device    2. LE edema - some recent issues with leg edema.  - taking lasix '10mg'$  daily  05/2016 60-65%, no WMAs, mild MR, normal RV    Doing silver sneakers at the gym Past Medical History:  Diagnosis Date   Anemia    Ankle fracture, right    past hx. -"no surgery"   Anxiety    Asthma    CHF (congestive heart failure) ([place]) 2009   Chronic lower back pain    Collagen vascular disease (HCC)    COPD (chronic obstructive pulmonary disease) (HCC)    Depression    Fibromyalgia    GERD (gastroesophageal reflux disease)    Hyperlipidemia    Hypertension    Immature cataract of both eyes    Myocardial infarction ([place])    "I've had a light one; don't know when it happened" (08/27/2017)   Osteoarthritis    Peripheral neuropathy    legs and feet   Persistent atrial fibrillation (HCC)    Tubular adenoma of colon      Allergies  Allergen Reactions   Codeine Nausea And Vomiting    Stomach pain also     Current Outpatient Medications  Medication Sig Dispense Refill   albuterol (VENTOLIN HFA) 108 (90 Base) MCG/ACT inhaler TAKE 2 PUFFS BY MOUTH EVERY 6 HOURS AS NEEDED FOR WHEEZE OR SHORTNESS OF BREATH 18 g 0   alendronate (FOSAMAX) 70 MG tablet Take 1 tablet (70 mg total) by mouth every 7 (seven) days. Take with a full glass of water on an empty stomach. 12 tablet 0   ALPRAZolam (XANAX) 0.5 MG tablet Take 0.5 mg by mouth at bedtime as needed for anxiety.     amitriptyline (ELAVIL) 75 MG tablet Take 1 tablet (75 mg total) by mouth at bedtime. 90 tablet 1   atorvastatin (LIPITOR) 20 MG tablet Take 1 tablet (20 mg total) by mouth daily. 90 tablet 3    calcium carbonate (OSCAL) 1500 (600 Ca) MG TABS tablet Take by mouth daily with breakfast.     DULoxetine (CYMBALTA) 60 MG capsule TAKE 1 CAPSULE EVERY DAY 30 capsule 0   ferrous sulfate 325 (65 FE) MG tablet Take 325 mg by mouth daily.     fluticasone (FLONASE) 50 MCG/ACT nasal spray SPRAY 2 SPRAYS INTO EACH NOSTRIL EVERY DAY 48 g 1   furosemide (LASIX) 20 MG tablet Take 10 mg by mouth daily.     gabapentin (NEURONTIN) 100 MG capsule Take 1 capsule (100 mg total) by mouth 2 (two) times daily. Needs to be seen 180 capsule 0   Multiple Vitamins-Minerals (ONE-A-DAY 50 PLUS PO) Take 1 tablet by mouth daily.     Omega-3 Fatty Acids (FISH OIL PO) Take 1 capsule by mouth daily.     pantoprazole (PROTONIX) 40 MG tablet Take 40 mg by mouth 2 (two) times daily.     potassium chloride SA (KLOR-CON) 20 MEQ tablet Take 1 tablet (20 mEq total) by mouth daily. 90 tablet 2   PRESCRIPTION MEDICATION Apply 1 application topically daily as needed (excema). HAL Ointment     SYMBICORT 80-4.5 MCG/ACT inhaler Inhale 2 puffs into the lungs 2 (  two) times daily.     triamcinolone ointment (KENALOG) 0.5 % Apply 1 application topically 2 (two) times daily. 30 g 0   Vitamin D, Ergocalciferol, (DRISDOL) 1.25 MG (50000 UNIT) CAPS capsule Take 1 capsule (50,000 Units total) by mouth every 7 (seven) days. 12 capsule 3   No current facility-administered medications for this visit.     Past Surgical History:  Procedure Laterality Date   APPENDECTOMY     AV NODE ABLATION N/A 08/27/2017   Procedure: AV NODE ABLATION;  Surgeon: Evans Lance, MD;  Location: [place] [facility];  Service: Cardiovascular;  Laterality: N/A;   CHOLECYSTECTOMY OPEN  1978   DILATION AND CURETTAGE OF UTERUS     FEMUR FRACTURE SURGERY Left 2013   "put 7" rod in it"   FRACTURE SURGERY     LAPAROSCOPY  08/22/2016   Procedure: LAPAROSCOPY DIAGNOSTIC;  Surgeon: [name], MD;  Location: [facility];  Service: General;;   MEDIAL PARTIAL KNEE REPLACEMENT  Right 2005   "@ [facility]"   PACEMAKER IMPLANT N/A 08/27/2017   Procedure: PACEMAKER IMPLANT;  Surgeon: Evans Lance, MD;  Location: [place] [facility];  Service: Cardiovascular;  Laterality: N/A;   ROUX-EN-Y GASTRIC BYPASS  2002   [place]  2002   TONSILLECTOMY  1944   TUBAL LIGATION     VAGINAL HYSTERECTOMY       Allergies  Allergen Reactions   Codeine Nausea And Vomiting    Stomach pain also      Family History  Problem Relation Age of Onset   Cancer Mother    Alzheimer's disease Mother    Heart disease Mother    Arthritis Father    Colon cancer Paternal Uncle 54   Asthma Daughter    Arthritis Daughter    Obesity Daughter    Arthritis Son    Hyperlipidemia Son    Obesity Son    Arthritis Son    Obesity Son      Social History Sabrina [name] reports that she quit smoking about 28 years ago. Her smoking use included cigarettes. She has a 12.50 pack-year smoking history. She has never used smokeless tobacco. Sabrina [name] reports that she does not currently use alcohol.   Review of Systems CONSTITUTIONAL: +fatigue HEENT: Eyes: No visual loss, blurred vision, double vision or yellow sclerae.No hearing loss, sneezing, congestion, runny nose or sore throat.  SKIN: No rash or itching.  CARDIOVASCULAR: per hpi RESPIRATORY: No shortness of breath, cough or sputum.  GASTROINTESTINAL: No anorexia, nausea, vomiting or diarrhea. No abdominal pain or blood.  GENITOURINARY: No burning on urination, no polyuria NEUROLOGICAL: No headache, dizziness, syncope, paralysis, ataxia, numbness or tingling in the extremities. No change in bowel or bladder control.  MUSCULOSKELETAL: No muscle, back pain, joint pain or stiffness.  LYMPHATICS: No enlarged nodes. No history of splenectomy.  PSYCHIATRIC: No history of depression or anxiety.  ENDOCRINOLOGIC: No reports of sweating, cold or heat intolerance. No polyuria or polydipsia.  Marland Kitchen   Physical  Examination Today's Vitals   05/06/21 1055  BP: 124/82  Pulse: 82  Weight: 157 lb (71.2 kg)  Height: '5\' 4"'$  (1.626 m)   Body mass index is 26.95 kg/m.  Gen: resting comfortably, no acute distress HEENT: no scleral icterus, pupils equal round and reactive, no palptable cervical adenopathy,  CV: RRR, no mr/g, no jvd Resp: Clear to auscultation bilaterally GI: abdomen is soft, non-tender, non-distended, normal bowel sounds, no hepatosplenomegaly MSK: extremities are  warm, 1+ bilateral edema Skin: warm, no rash Neuro:  no focal deficits Psych: appropriate affect   Diagnostic Studies  05/2016 echo Study Conclusions   - Left ventricle: The cavity size was normal. There was mild    concentric hypertrophy. Systolic function was normal. The    estimated ejection fraction was in the range of 60% to 65%. Wall    motion was normal; there were no regional wall motion    abnormalities. There was no evidence of elevated ventricular    filling pressure by Doppler parameters.  - Aortic valve: Trileaflet; normal thickness leaflets. There was no    regurgitation.  - Mitral valve: Structurally normal valve. There was mild    regurgitation.  - Left atrium: The atrium was severely dilated.  - Right ventricle: The cavity size was normal. Wall thickness was    normal. Systolic function was normal.  - Right atrium: The atrium was normal in size.  - Tricuspid valve: There was mild regurgitation.  - Pulmonic valve: There was no regurgitation.  - Pulmonary arteries: Systolic pressure was within the normal    range.  - Inferior vena cava: The vessel was normal in size.  - Pericardium, extracardiac: There was no pericardial effusion.     05/2016 nuclear stress There was no ST segment deviation noted during stress. The study is normal. This is a low risk study. The left ventricular ejection fraction is hyperdynamic (>65%).   Normal pharmacologic stress test with no evidence of scar or ischemia.     Assessment and Plan  1.Afib - no recent symptoms, she is s/p av nodal ablation with pacemaker with recent normal device check - not on anticoag due to prior GI bleed, has appt to discuss Watchman  2. LE edema - increase lasix to '10mg'$  daily may take additional 77mas needed for weight gain or edema - if significant prgression over time would repeat echo  F/u 3 months      JArnoldo Lenis M.D.

## 2021-05-07 ENCOUNTER — Encounter: Payer: Self-pay | Admitting: *Deleted

## 2021-05-07 ENCOUNTER — Encounter: Payer: Self-pay | Admitting: Family

## 2021-05-07 ENCOUNTER — Ambulatory Visit (INDEPENDENT_AMBULATORY_CARE_PROVIDER_SITE_OTHER): Payer: Medicare HMO | Admitting: Family

## 2021-05-07 ENCOUNTER — Ambulatory Visit (INDEPENDENT_AMBULATORY_CARE_PROVIDER_SITE_OTHER): Payer: Medicare HMO | Admitting: Cardiology

## 2021-05-07 ENCOUNTER — Encounter: Payer: Self-pay | Admitting: Cardiology

## 2021-05-07 ENCOUNTER — Other Ambulatory Visit: Payer: Self-pay | Admitting: Family Medicine

## 2021-05-07 VITALS — BP 111/77 | HR 71 | Temp 97.0°F | Ht 64.0 in | Wt 157.2 lb

## 2021-05-07 VITALS — BP 94/62 | HR 80 | Ht 64.0 in | Wt 158.2 lb

## 2021-05-07 DIAGNOSIS — Z95828 Presence of other vascular implants and grafts: Secondary | ICD-10-CM

## 2021-05-07 DIAGNOSIS — K219 Gastro-esophageal reflux disease without esophagitis: Secondary | ICD-10-CM

## 2021-05-07 DIAGNOSIS — D509 Iron deficiency anemia, unspecified: Secondary | ICD-10-CM

## 2021-05-07 DIAGNOSIS — E663 Overweight: Secondary | ICD-10-CM

## 2021-05-07 DIAGNOSIS — F411 Generalized anxiety disorder: Secondary | ICD-10-CM | POA: Diagnosis not present

## 2021-05-07 DIAGNOSIS — Z950 Presence of cardiac pacemaker: Secondary | ICD-10-CM

## 2021-05-07 DIAGNOSIS — I4821 Permanent atrial fibrillation: Secondary | ICD-10-CM

## 2021-05-07 DIAGNOSIS — F331 Major depressive disorder, recurrent, moderate: Secondary | ICD-10-CM

## 2021-05-07 DIAGNOSIS — Z7901 Long term (current) use of anticoagulants: Secondary | ICD-10-CM

## 2021-05-07 DIAGNOSIS — I10 Essential (primary) hypertension: Secondary | ICD-10-CM

## 2021-05-07 DIAGNOSIS — G4700 Insomnia, unspecified: Secondary | ICD-10-CM

## 2021-05-07 DIAGNOSIS — Z86718 Personal history of other venous thrombosis and embolism: Secondary | ICD-10-CM

## 2021-05-07 DIAGNOSIS — E538 Deficiency of other specified B group vitamins: Secondary | ICD-10-CM

## 2021-05-07 DIAGNOSIS — K922 Gastrointestinal hemorrhage, unspecified: Secondary | ICD-10-CM | POA: Diagnosis not present

## 2021-05-07 DIAGNOSIS — I4891 Unspecified atrial fibrillation: Secondary | ICD-10-CM

## 2021-05-07 DIAGNOSIS — Z23 Encounter for immunization: Secondary | ICD-10-CM

## 2021-05-07 DIAGNOSIS — E559 Vitamin D deficiency, unspecified: Secondary | ICD-10-CM

## 2021-05-07 MED ORDER — PANTOPRAZOLE SODIUM 40 MG PO TBEC: 40.0000 mg | DELAYED_RELEASE_TABLET | Freq: Two times a day (BID) | ORAL | 3 refills | Status: DC

## 2021-05-07 NOTE — Progress Notes (Signed)
Electrophysiology Office Note:    Date:  05/07/2021   ID:  Sabrina, [name] October 20, 1945, MRN [id number]  PCP:  Sharion Balloon, [place] Cardiologist:  None  [facility] Electrophysiologist:  [name], MD   Referring MD: Evans Lance, MD   Chief Complaint: AF  History of Present Illness:    Sabrina [name] is a 75 y.o. female who presents for an evaluation of AF at the request of Dr Lovena Le. Their medical history includes COPD, GERD, HTN, DVT, GI bleed, DVT with an IVC filter placed in [facility]. She also has sinus node dysfunction with a PPM implanted by Dr Lovena Le.  The patient is currently off anticoagulation due to history of significant GI bleed episode. This occurred after she was started on [facility] after the DVT diagnosis. Because of the bleeding, her anticoagulant was stopped and a IVC filter was placed.  This was to be removed but she would like this to be done in [place].  Given her concern about having a stroke off anticoagulation, the patient presents to discuss watchman.      Past Medical History:  Diagnosis Date   Anemia    Ankle fracture, right    past hx. -"no surgery"   Anxiety    Asthma    CHF (congestive heart failure) ([place]) 2009   Chronic lower back pain    Collagen vascular disease (HCC)    COPD (chronic obstructive pulmonary disease) (HCC)    Depression    Fibromyalgia    GERD (gastroesophageal reflux disease)    Hyperlipidemia    Hypertension    Immature cataract of both eyes    Myocardial infarction ([place])    "I've had a light one; don't know when it happened" (08/27/2017)   Osteoarthritis    Peripheral neuropathy    legs and feet   Persistent atrial fibrillation (HCC)    Tubular adenoma of colon     Past Surgical History:  Procedure Laterality Date   APPENDECTOMY     AV NODE ABLATION N/A 08/27/2017   Procedure: AV NODE ABLATION;  Surgeon: Evans Lance, MD;  Location: [place] [facility];  Service:  Cardiovascular;  Laterality: N/A;   CHOLECYSTECTOMY OPEN  1978   DILATION AND CURETTAGE OF UTERUS     FEMUR FRACTURE SURGERY Left 2013   "put 7" rod in it"   FRACTURE SURGERY     LAPAROSCOPY  08/22/2016   Procedure: LAPAROSCOPY DIAGNOSTIC;  Surgeon: [name], MD;  [facility];  Service: General;;   MEDIAL PARTIAL KNEE REPLACEMENT Right 2005   "@ [facility]"   PACEMAKER IMPLANT N/A 08/27/2017   Procedure: PACEMAKER IMPLANT;  Surgeon: Evans Lance, MD;  Location: [place] [facility];  Service: Cardiovascular;  Laterality: N/A;   ROUX-EN-Y GASTRIC BYPASS  2002   [place]   TONSILLECTOMY  1944   TUBAL LIGATION     VAGINAL HYSTERECTOMY      Current Medications: Current Meds  Medication Sig   albuterol (VENTOLIN HFA) 108 (90 Base) MCG/ACT inhaler TAKE 2 PUFFS BY MOUTH EVERY 6 HOURS AS NEEDED FOR WHEEZE OR SHORTNESS OF BREATH   alendronate (FOSAMAX) 70 MG tablet Take 1 tablet (70 mg total) by mouth every 7 (seven) days. Take with a full glass of water on an empty stomach.   ALPRAZolam (XANAX) 0.5 MG tablet Take 0.5 mg by mouth at bedtime as needed for anxiety.   atorvastatin (LIPITOR)  20 MG tablet Take 1 tablet (20 mg total) by mouth daily.   calcium carbonate (OSCAL) 1500 (600 Ca) MG TABS tablet Take by mouth daily with breakfast.   DULoxetine (CYMBALTA) 60 MG capsule TAKE 1 CAPSULE EVERY DAY   ferrous sulfate 325 (65 FE) MG tablet Take 325 mg by mouth daily.   fluticasone (FLONASE) 50 MCG/ACT nasal spray SPRAY 2 SPRAYS INTO EACH NOSTRIL EVERY DAY   furosemide (LASIX) 20 MG tablet Take 0.5 tablets (10 mg total) by mouth daily. (MAY TAKE EXTRA 1/2 TAB - '10MG'$  AS NEEDED FOR SWELLING OR 3LB WEIGHT GAIN)   gabapentin (NEURONTIN) 100 MG capsule Take 1 capsule (100 mg total) by mouth 2 (two) times daily. Needs to be seen   Multiple Vitamins-Minerals (ONE-A-DAY 50 PLUS PO) Take 1 tablet by mouth daily.   Omega-3 Fatty Acids (FISH OIL PO) Take 1 capsule  by mouth daily.   pantoprazole (PROTONIX) 40 MG tablet Take 1 tablet (40 mg total) by mouth 2 (two) times daily.   potassium chloride SA (KLOR-CON) 20 MEQ tablet Take 1 tablet (20 mEq total) by mouth daily.   PRESCRIPTION MEDICATION Apply 1 application topically daily as needed (excema). HAL Ointment   SYMBICORT 80-4.5 MCG/ACT inhaler Inhale 2 puffs into the lungs 2 (two) times daily.   triamcinolone ointment (KENALOG) 0.5 % Apply 1 application topically 2 (two) times daily.   Vitamin D, Ergocalciferol, (DRISDOL) 1.25 MG (50000 UNIT) CAPS capsule Take 1 capsule (50,000 Units total) by mouth every 7 (seven) days.     Allergies:   Codeine   Social History   Socioeconomic History   Marital status: Divorced    Spouse name: Not on file   Number of children: 3   Years of education: 2 years of college   Highest education level: Some college, no degree  Occupational History   Occupation: Retired  Tobacco Use   Smoking status: Former    Packs/day: 0.50    Years: 25.00    Pack years: 12.50    Types: Cigarettes    Quit date: 02/26/1993    Years since quitting: 28.2   Smokeless tobacco: Never  Vaping Use   Vaping Use: Never used  Substance and Sexual Activity   Alcohol use: Not Currently    Comment: 08/27/2017 "nothing since early 2000s"   Drug use: Not Currently   Sexual activity: Not Currently    Birth control/protection: Surgical  Other Topics Concern   Not on file  Social History Narrative   ** Merged History Encounter **       Pt is right handed Lives in single story home with her grandson Has 3 adult children Associated degree  Retired Quarry manager   Social Determinants of Radio broadcast assistant Strain: Low Risk    Difficulty of Paying Living Expenses: Not very hard  Food Insecurity: No Food Insecurity   Worried About Charity fundraiser in the Last Year: Never true   Arboriculturist in the Last Year: Never true  Transportation Needs: No Transportation Needs   Lack of  Transportation (Medical): No   Lack of Transportation (Non-Medical): No  Physical Activity: Sufficiently Active   Days of Exercise per Week: 3 days   Minutes of Exercise per Session: 60 min  Stress: Stress Concern Present   Feeling of Stress : To some extent  Social Connections: Moderately Integrated   Frequency of Communication with Friends and Family: More than three times a week   Frequency of  Social Gatherings with Friends and Family: Twice a week   Attends Religious Services: 1 to 4 times per year   Active Member of [employer] or Organizations: Yes   Attends Music therapist: 1 to 4 times per year   Marital Status: Divorced     Family History: The patient's family history includes Alzheimer's disease in her mother; Arthritis in her daughter, father, son, and son; Asthma in her daughter; Cancer in her mother; Colon cancer (age of onset: 41) in her paternal uncle; Heart disease in her mother; Hyperlipidemia in her son; Obesity in her daughter, son, and son.  ROS:   Please see the history of present illness.    All other systems reviewed and are negative.  EKGs/Labs/Other Studies Reviewed:    The following studies were reviewed today:    EKG:  The ekg ordered today demonstrates atrial fibrillation with ventricular pacing.  Recent Labs: 01/10/2021: ALT 6; BUN 11; Creatinine, Ser 0.82; Potassium 4.6; Sodium 144 03/19/2021: Hemoglobin 11.8; Platelets 350  Recent Lipid Panel    Component Value Date/Time   CHOL 199 09/30/2019 1018   TRIG 137 09/30/2019 1018   HDL 60 09/30/2019 1018   CHOLHDL 3.3 09/30/2019 1018   LDLCALC 115 (H) 09/30/2019 1018    Physical Exam:    VS:  BP 94/62   Pulse 80   Ht '5\' 4"'$  (1.626 m)   Wt 158 lb 3.2 oz (71.8 kg)   SpO2 96%   BMI 27.15 kg/m     Wt Readings from Last 3 Encounters:  05/07/21 158 lb 3.2 oz (71.8 kg)  05/07/21 157 lb 3.2 oz (71.3 kg)  05/06/21 157 lb (71.2 kg)     GEN:  Well nourished, well developed in no acute  distress HEENT: Normal NECK: No JVD; No carotid bruits LYMPHATICS: No lymphadenopathy CARDIAC: RRR, no murmurs, rubs, gallops RESPIRATORY:  Clear to auscultation without rales, wheezing or rhonchi  ABDOMEN: Soft, non-tender, non-distended MUSCULOSKELETAL:  No edema; No deformity  SKIN: Warm and dry NEUROLOGIC:  Alert and oriented x 3 PSYCHIATRIC:  Normal affect   ASSESSMENT:    1. Permanent atrial fibrillation (HCC)   2. Cardiac pacemaker in situ   3. Gastrointestinal hemorrhage, unspecified gastrointestinal hemorrhage type   4. Presence of IVC filter    PLAN:    In order of problems listed above:  1. Permanent atrial fibrillation (HCC) Off anticoagulation due to history of significant GI bleeding. She is interested in the watchman device as part of a strategy to avoid long term exposure to anticoagulants. I think this is a very reasonable strategy.  I have discussed the watchman procedure in detail with the patient during today's clinic appointment including the risks, recovery and need for short term anticoagulation. Given her history of significant GI bleeding, will use apixaban 2.'5mg'$  PO BID for 4 weeks prior to the implant. We discussed this regimen in detail during today's clinic appointment.   -------------------------------------  I have seen Jenell Milliner in the office today who is being considered for a Watchman left atrial appendage closure device. I believe they will benefit from this procedure given their history of atrial fibrillation, CHA2DS2-VASc score of 4 and unadjusted ischemic stroke rate of 6.7% per year. Unfortunately, the patient is not felt to be a long term anticoagulation candidate secondary to history of GI bleeding. The patient's chart has been reviewed and I feel that they would be a candidate for short term oral anticoagulation after Watchman implant.   It  is my belief that after undergoing a LAA closure procedure, Jenell Milliner will not need  long term anticoagulation which eliminates anticoagulation side effects and major bleeding risk.   Procedural risks for the Watchman implant have been reviewed with the patient including a 0.5% risk of stroke, <1% risk of perforation and <1% risk of device embolization.    The published clinical data on the safety and effectiveness of WATCHMAN include but are not limited to the following: - Holmes DR, Mechele Claude, Sick P et al. for the PROTECT AF Investigators. Percutaneous closure of the left atrial appendage versus warfarin therapy for prevention of stroke in patients with atrial fibrillation: a randomised non-inferiority trial. Lancet 2009; 374: 534-42. Mechele Claude, Doshi SK, Abelardo Diesel D et al. on behalf of the PROTECT AF Investigators. Percutaneous Left Atrial Appendage Closure for Stroke Prophylaxis in Patients With Atrial Fibrillation 2.3-Year Follow-up of the PROTECT AF (Watchman Left Atrial Appendage System for Embolic Protection in Patients With Atrial Fibrillation) Trial. Circulation 2013; 127:720-729. - Alli O, Doshi S,  Kar S, Reddy VY, Sievert H et al. Quality of Life Assessment in the Randomized PROTECT AF (Percutaneous Closure of the Left Atrial Appendage Versus Warfarin Therapy for Prevention of Stroke in Patients With Atrial Fibrillation) Trial of Patients at Risk for Stroke With Nonvalvular Atrial Fibrillation. J Am Coll Cardiol 2013; N8865744. Vertell Limber DR, Tarri Abernethy, Price M, [place], Sievert H, Doshi S, Huber K, Reddy V. Prospective randomized evaluation of the Watchman left atrial appendage Device in patients with atrial fibrillation versus long-term warfarin therapy; the PREVAIL trial. Journal of the [employer] of Cardiology, Vol. 4, No. 1, 2014, 1-11. - Kar S, Doshi SK, Sadhu A, Horton R, Osorio J et al. Primary outcome evaluation of a next-generation left atrial appendage closure device: results from the PINNACLE FLX trial. Circulation 2021;143(18)1754-1762.    After  today's visit with the patient which was dedicated solely for shared decision making visit regarding LAA closure device, the patient decided to proceed with the LAA appendage closure procedure scheduled to be done in the near future at [facility]. Prior to the procedure, I would like to obtain a gated CT scan of the chest with contrast timed for PV/LA visualization.   Additionally, the patient will need the IVC filter removed (referral placed to Dr Trula Slade).   2. Cardiac pacemaker in situ Functioning appropriately.  3. Gastrointestinal hemorrhage, unspecified gastrointestinal hemorrhage type Off anticoagulants for now. Watchman as above.  4. Presence of IVC filter Will need to be removed. Was implanted originally in [place] but she would like the filter removed in [place].    Total time spent with patient today 45 minutes. This includes reviewing records, evaluating the patient and coordinating care.  Medication Adjustments/Labs and Tests Ordered: Current medicines are reviewed at length with the patient today.  Concerns regarding medicines are outlined above.  Orders Placed This Encounter  Procedures   CT CARDIAC MORPH/PULM VEIN W/CM&W/O CA SCORE   Basic Metabolic Panel (BMET)   Ambulatory referral to Vascular Surgery   EKG 12-Lead   ECHOCARDIOGRAM COMPLETE   No orders of the defined types were placed in this encounter.    Signed, Hilton Cork. Quentin Ore, MD, [facility], [facility] 05/07/2021 11:06 PM    Electrophysiology [facility]

## 2021-05-07 NOTE — Progress Notes (Signed)
Subjective:    Patient ID: Sabrina [name], female    DOB: Oct 12, 1945, 75 y.o.   MRN: [id number]  Chief Complaint  Patient presents with   Medical Management of Chronic Issues   Pt presents to the office today for chronic follow up. Pt is followed by Cardiologists every 4 months for A Fib and CHF. Has a pacemaker.    She is followed by Neurologists every 3 months for weakness and near syncope episodes. Hypertension This is a chronic problem. The current episode started more than 1 year ago. The problem has been resolved since onset. The problem is controlled. Associated symptoms include anxiety, malaise/fatigue, peripheral edema and shortness of breath. Risk factors for coronary artery disease include dyslipidemia, obesity and sedentary lifestyle. The current treatment provides moderate improvement.  Asthma She complains of shortness of breath and wheezing. There is no cough. The current episode started more than 1 year ago. Associated symptoms include malaise/fatigue. She reports moderate improvement on treatment. Her past medical history is significant for asthma.  Anemia Presents for follow-up visit. Symptoms include leg swelling and malaise/fatigue. There has been no bruising/bleeding easily.  Depression        This is a chronic problem.  The current episode started more than 1 year ago.   Associated symptoms include insomnia, decreased interest and sad.  Associated symptoms include no helplessness and no hopelessness.  Past medical history includes anxiety.   Anxiety Presents for follow-up visit. Symptoms include excessive worry, insomnia, nervous/anxious behavior and shortness of breath. Symptoms occur occasionally. The severity of symptoms is moderate.   Her past medical history is significant for anemia and asthma.  Insomnia Primary symptoms: difficulty falling asleep, frequent awakening, malaise/fatigue.   The current episode started more than one year. The onset quality is  gradual. The problem occurs intermittently. PMH includes: depression.      Review of Systems  Constitutional:  Positive for malaise/fatigue.  Respiratory:  Positive for shortness of breath and wheezing. Negative for cough.   Hematological:  Does not bruise/bleed easily.  Psychiatric/Behavioral:  Positive for depression. The patient is nervous/anxious and has insomnia.   All other systems reviewed and are negative.     Objective:   Physical Exam Vitals reviewed.  Constitutional:      General: She is not in acute distress.    Appearance: She is well-developed.  HENT:     Head: Normocephalic and atraumatic.     Right Ear: Tympanic membrane normal.     Left Ear: Tympanic membrane normal.  Eyes:     Pupils: Pupils are equal, round, and reactive to light.  Neck:     Thyroid: No thyromegaly.  Cardiovascular:     Rate and Rhythm: Normal rate and regular rhythm.     Heart sounds: Normal heart sounds. No murmur heard. Pulmonary:     Effort: Pulmonary effort is normal. No respiratory distress.     Breath sounds: Normal breath sounds. No wheezing.  Abdominal:     General: Bowel sounds are normal. There is no distension.     Palpations: Abdomen is soft.     Tenderness: There is no abdominal tenderness.  Musculoskeletal:        General: No tenderness. Normal range of motion.     Cervical back: Normal range of motion and neck supple.     Right lower leg: Edema (2+ ankle) present.     Left lower leg: Edema (2+ ankle) present.  Skin:    General: Skin is warm  and dry.  Neurological:     Mental Status: She is alert and oriented to person, place, and time.     Cranial Nerves: No cranial nerve deficit.     Deep Tendon Reflexes: Reflexes are normal and symmetric.  Psychiatric:        Behavior: Behavior normal.        Thought Content: Thought content normal.        Judgment: Judgment normal.      BP 111/77   Pulse 71   Temp (!) 97 F (36.1 C) (Temporal)   Ht '5\' 4"'  (1.626 m)   Wt  157 lb 3.2 oz (71.3 kg)   BMI 26.98 kg/m      Assessment & Plan:  Sabrina [name] comes in today with chief complaint of Medical Management of Chronic Issues   Diagnosis and orders addressed:  1. Atrial fibrillation with RVR (HCC) - CMP14+EGFR  2. Benign essential HTN - CMP14+EGFR  3. History of DVT (deep vein thrombosis) - CMP14+EGFR  4. Chronic anticoagulation - CMP14+EGFR  5. Overweight (BMI 25.0-29.9) - CMP14+EGFR  6. Pacemaker - CMP14+EGFR  7. Vitamin D deficiency - CMP14+EGFR  8. Generalized anxiety disorder - CMP14+EGFR  9. Moderate episode of recurrent major depressive disorder (HCC) - CMP14+EGFR  10. Insomnia, unspecified type - CMP14+EGFR  11. Iron deficiency anemia, unspecified iron deficiency anemia type - CMP14+EGFR  12. Need for shingles vaccine - Varicella-zoster vaccine IM (Shingrix) - CMP14+EGFR  13. Gastroesophageal reflux disease, unspecified whether esophagitis present - pantoprazole (PROTONIX) 40 MG tablet; Take 1 tablet (40 mg total) by mouth 2 (two) times daily.  Dispense: 90 tablet; Refill: 3 - CMP14+EGFR   Labs pending Health Maintenance reviewed Diet and exercise encouraged  Follow up plan: 3 months    Sabrina Dun, FNP

## 2021-05-07 NOTE — Patient Instructions (Signed)
Peripheral Edema Peripheral edema is swelling that is caused by a buildup of fluid. Peripheral edema most often affects the lower legs, ankles, and feet. It can also develop in the arms, hands, and face. The area of the body that has peripheral edema will look swollen. It may also feel heavy or warm. Your clothes may start to feel tight. Pressing on the area may make a temporary dent in your skin. You may not be able to move your swollen arm or leg as much as usual. There are many causes of peripheral edema. It can happen because of a complication of other conditions such as congestive heart failure, kidney disease, or a problem with your blood circulation. It also can be a side effect of certain medicines or because of an infection. It often happens to women during pregnancy. Sometimes, the cause is not known. Follow these instructions at home: Managing pain, stiffness, and swelling  Raise (elevate) your legs while you are sitting or lying down. Move around often to prevent stiffness and to lessen swelling. Do not sit or stand for long periods of time. Wear support stockings as told by your health care provider. Medicines Take over-the-counter and prescription medicines only as told by your health care provider. Your health care provider may prescribe medicine to help your body get rid of excess water (diuretic). General instructions Pay attention to any changes in your symptoms. Follow instructions from your health care provider about limiting salt (sodium) in your diet. Sometimes, eating less salt may reduce swelling. Moisturize skin daily to help prevent skin from cracking and draining. Keep all follow-up visits as told by your health care provider. This is important. Contact a health care provider if you have: A fever. Edema that starts suddenly or is getting worse, especially if you are pregnant or have a medical condition. Swelling in only one leg. Increased swelling, redness, or pain in  one or both of your legs. Drainage or sores at the area where you have edema. Get help right away if you: Develop shortness of breath, especially when you are lying down. Have pain in your chest or abdomen. Feel weak. Feel faint. Summary Peripheral edema is swelling that is caused by a buildup of fluid. Peripheral edema most often affects the lower legs, ankles, and feet. Move around often to prevent stiffness and to lessen swelling. Do not sit or stand for long periods of time. Pay attention to any changes in your symptoms. Contact a health care provider if you have edema that starts suddenly or is getting worse, especially if you are pregnant or have a medical condition. Get help right away if you develop shortness of breath, especially when lying down. This information is not intended to replace advice given to you by your health care provider. Make sure you discuss any questions you have with your health care provider. Document Revised: 06/02/2018 Document Reviewed: 06/02/2018 Elsevier Patient Education  2022 Elsevier Inc.  

## 2021-05-07 NOTE — Patient Instructions (Addendum)
Medication Instructions:  Your physician recommends that you continue on your current medications as directed. Please refer to the Current Medication list given to you today. *If you need a refill on your cardiac medications before your next appointment, please call your pharmacy*   Lab Work: You will get lab work today:  BMP   If you have labs (blood work) drawn today and your tests are completely normal, you will receive your results only by: [place] (if you have MyChart) OR A paper copy in the mail If you have any lab test that is abnormal or we need to change your treatment, we will call you to review the results.   Testing/Procedures: Your physician has requested that you have an echocardiogram. Echocardiography is a painless test that uses sound waves to create images of your heart. It provides your doctor with information about the size and shape of your heart and how well your heart's chambers and valves are working. This procedure takes approximately one hour. There are no restrictions for this procedure.   Please schedule ECHO   Your physician has requested that you have cardiac CT. Cardiac computed tomography (CT) is a painless test that uses an x-ray machine to take clear, detailed pictures of your heart.   You will get a call with date/time for this test when it is approved by your insurance.   Follow-Up: At [facility], you and your health needs are our priority.  As part of our continuing mission to provide you with exceptional heart care, we have created designated Provider Care Teams.  These Care Teams include your primary Cardiologist (physician) and Advanced Practice Providers (APPs -  Physician Assistants and Nurse Practitioners) who all work together to provide you with the care you need, when you need it.   Your next appointment:     Your next appointment will be at out office after your current tests have been evaluated.       Nurse navigator:  Lenice Llamas [phone]       Left Atrial Appendage Closure Device Implantation    Left atrial appendage (LAA) closure device implantation is a procedure to put a small device in the LAA of the heart. The LAA is a small sac in the wall of the heart's left upper chamber. Blood clots can form in the LAA in people with atrial fibrillation (AFib). The device closes the LAA to help prevent a blood clot and stroke.

## 2021-05-08 LAB — CMP14+EGFR
ALT: 15 IU/L (ref 0–32)
AST: 19 IU/L (ref 0–40)
Albumin/Globulin Ratio: 1.7 (ref 1.2–2.2)
Albumin: 4.2 g/dL (ref 3.7–4.7)
Alkaline Phosphatase: 144 IU/L — ABNORMAL HIGH (ref 44–121)
BUN/Creatinine Ratio: 20 (ref 12–28)
BUN: 20 mg/dL (ref 8–27)
Bilirubin Total: 0.7 mg/dL (ref 0.0–1.2)
CO2: 25 mmol/L (ref 20–29)
Calcium: 9.5 mg/dL (ref 8.7–10.3)
Chloride: 100 mmol/L (ref 96–106)
Creatinine, Ser: 0.98 mg/dL (ref 0.57–1.00)
Globulin, Total: 2.5 g/dL (ref 1.5–4.5)
Glucose: 97 mg/dL (ref 65–99)
Potassium: 5.2 mmol/L (ref 3.5–5.2)
Sodium: 139 mmol/L (ref 134–144)
Total Protein: 6.7 g/dL (ref 6.0–8.5)
eGFR: 61 mL/min/{1.73_m2} (ref >59)

## 2021-05-08 LAB — VITAMIN B12: Vitamin B-12: 301 pg/mL (ref 232–1245)

## 2021-05-08 NOTE — Addendum Note (Signed)
Addended by: Darrell Jewel on: 05/08/2021 01:47 PM   Modules accepted: Orders

## 2021-05-09 ENCOUNTER — Other Ambulatory Visit: Payer: Self-pay

## 2021-05-09 ENCOUNTER — Telehealth: Payer: Self-pay | Admitting: Family

## 2021-05-09 ENCOUNTER — Ambulatory Visit: Payer: Medicare HMO

## 2021-05-09 NOTE — Telephone Encounter (Signed)
See result note.  

## 2021-05-13 ENCOUNTER — Ambulatory Visit (INDEPENDENT_AMBULATORY_CARE_PROVIDER_SITE_OTHER): Payer: Medicare HMO

## 2021-05-13 ENCOUNTER — Other Ambulatory Visit: Payer: Self-pay

## 2021-05-13 ENCOUNTER — Encounter (HOSPITAL_COMMUNITY): Payer: Self-pay | Admitting: Emergency Medicine

## 2021-05-13 ENCOUNTER — Telehealth (HOSPITAL_COMMUNITY): Payer: Self-pay | Admitting: Emergency Medicine

## 2021-05-13 DIAGNOSIS — E538 Deficiency of other specified B group vitamins: Secondary | ICD-10-CM | POA: Diagnosis not present

## 2021-05-13 MED ORDER — CYANOCOBALAMIN 1000 MCG/ML IJ SOLN
1000.0000 ug | Freq: Once | INTRAMUSCULAR | Status: AC
  Administered 2021-05-13: 1000 ug via INTRAMUSCULAR

## 2021-05-13 NOTE — Progress Notes (Signed)
Cyanocobalamin injection given to left deltoid.  Patient tolerated well. 

## 2021-05-13 NOTE — Telephone Encounter (Signed)
Reaching out to patient to offer assistance regarding upcoming cardiac imaging study; pt verbalizes understanding of appt date/time, parking situation and where to check in, pre-test NPO status and medications ordered, and verified current allergies; name and call back number provided for further questions should they arise Marchia Bond RN Navigator Cardiac Imaging [facility] [phone] office [phone] cell   Denies claustro Denies iv issues BP soft, may possibly need IV BB on scanner if HR > 80bpm and BP can tolerate dose Clarise Cruz

## 2021-05-14 ENCOUNTER — Ambulatory Visit (INDEPENDENT_AMBULATORY_CARE_PROVIDER_SITE_OTHER): Payer: Medicare HMO | Admitting: *Deleted

## 2021-05-14 DIAGNOSIS — E538 Deficiency of other specified B group vitamins: Secondary | ICD-10-CM

## 2021-05-14 MED ORDER — CYANOCOBALAMIN 1000 MCG/ML IJ SOLN
1000.0000 ug | Freq: Every day | INTRAMUSCULAR | Status: AC
  Administered 2021-05-14 – 2021-05-17 (×4): 1000 ug via INTRAMUSCULAR

## 2021-05-14 NOTE — Progress Notes (Signed)
Vitamin b12 injection given and patient tolerated well.  

## 2021-05-15 ENCOUNTER — Ambulatory Visit (HOSPITAL_COMMUNITY)
Admission: RE | Admit: 2021-05-15 | Discharge: 2021-05-15 | Disposition: A | Discharge status: Home / Self Care | Payer: Medicare HMO | Source: Ambulatory Visit | Attending: Cardiology | Admitting: Cardiology

## 2021-05-15 ENCOUNTER — Ambulatory Visit (INDEPENDENT_AMBULATORY_CARE_PROVIDER_SITE_OTHER): Payer: Medicare HMO

## 2021-05-15 ENCOUNTER — Other Ambulatory Visit: Payer: Self-pay

## 2021-05-15 DIAGNOSIS — I4821 Permanent atrial fibrillation: Secondary | ICD-10-CM | POA: Diagnosis not present

## 2021-05-15 DIAGNOSIS — E538 Deficiency of other specified B group vitamins: Secondary | ICD-10-CM

## 2021-05-15 MED ORDER — IOHEXOL 350 MG/ML SOLN
95.0000 mL | Freq: Once | INTRAVENOUS | Status: AC | PRN
  Administered 2021-05-15: 95 mL via INTRAVENOUS

## 2021-05-15 MED ORDER — NITROGLYCERIN 0.4 MG SL SUBL: 0.8000 mg | SUBLINGUAL_TABLET | Freq: Once | SUBLINGUAL | Status: DC

## 2021-05-15 NOTE — Progress Notes (Signed)
Cyanocobalamin injection given to left deltoid.  Patient tolerated well. 

## 2021-05-16 ENCOUNTER — Ambulatory Visit (INDEPENDENT_AMBULATORY_CARE_PROVIDER_SITE_OTHER): Payer: Medicare HMO | Admitting: *Deleted

## 2021-05-16 DIAGNOSIS — E538 Deficiency of other specified B group vitamins: Secondary | ICD-10-CM | POA: Diagnosis not present

## 2021-05-17 ENCOUNTER — Ambulatory Visit (INDEPENDENT_AMBULATORY_CARE_PROVIDER_SITE_OTHER): Payer: Medicare HMO | Admitting: *Deleted

## 2021-05-17 ENCOUNTER — Other Ambulatory Visit: Payer: Self-pay

## 2021-05-17 DIAGNOSIS — E538 Deficiency of other specified B group vitamins: Secondary | ICD-10-CM

## 2021-05-24 ENCOUNTER — Ambulatory Visit (INDEPENDENT_AMBULATORY_CARE_PROVIDER_SITE_OTHER): Payer: Medicare HMO | Admitting: *Deleted

## 2021-05-24 ENCOUNTER — Other Ambulatory Visit: Payer: Self-pay

## 2021-05-24 DIAGNOSIS — E538 Deficiency of other specified B group vitamins: Secondary | ICD-10-CM | POA: Diagnosis not present

## 2021-05-24 MED ORDER — CYANOCOBALAMIN 1000 MCG/ML IJ SOLN
1000.0000 ug | Freq: Once | INTRAMUSCULAR | Status: AC
  Administered 2021-05-24: 1000 ug via INTRAMUSCULAR

## 2021-05-30 ENCOUNTER — Other Ambulatory Visit: Payer: Self-pay

## 2021-05-30 ENCOUNTER — Ambulatory Visit (INDEPENDENT_AMBULATORY_CARE_PROVIDER_SITE_OTHER): Payer: Medicare HMO

## 2021-05-30 DIAGNOSIS — E538 Deficiency of other specified B group vitamins: Secondary | ICD-10-CM | POA: Diagnosis not present

## 2021-05-30 MED ORDER — CYANOCOBALAMIN 1000 MCG/ML IJ SOLN
1000.0000 ug | Freq: Once | INTRAMUSCULAR | Status: AC
  Administered 2021-05-30: 1000 ug via INTRAMUSCULAR

## 2021-05-30 NOTE — Progress Notes (Signed)
Cyanocobalamin injection given to left deltoid.  Patient tolerated well. 

## 2021-05-30 NOTE — Progress Notes (Deleted)
Patient here to have TB skin test that was placed on 05/28/21 read.  Results are negative, no induration.

## 2021-05-31 ENCOUNTER — Ambulatory Visit (HOSPITAL_COMMUNITY)
Admission: RE | Admit: 2021-05-31 | Discharge: 2021-05-31 | Disposition: A | Discharge status: Home / Self Care | Payer: Medicare HMO | Source: Ambulatory Visit | Attending: Cardiology | Admitting: Cardiology

## 2021-05-31 DIAGNOSIS — I4821 Permanent atrial fibrillation: Secondary | ICD-10-CM | POA: Insufficient documentation

## 2021-06-01 LAB — ECHOCARDIOGRAM COMPLETE
Area-P 1/2: 3.95 cm2
MV M vel: 4.91 m/s
MV Peak grad: 96.4 mmHg
P 1/2 time: 623 msec
S' Lateral: 2.3 cm

## 2021-06-05 ENCOUNTER — Encounter: Payer: Self-pay | Admitting: Vascular Surgery

## 2021-06-05 ENCOUNTER — Other Ambulatory Visit: Payer: Self-pay

## 2021-06-05 ENCOUNTER — Ambulatory Visit (INDEPENDENT_AMBULATORY_CARE_PROVIDER_SITE_OTHER): Payer: Medicare HMO | Admitting: Vascular Surgery

## 2021-06-05 ENCOUNTER — Encounter: Payer: Medicare HMO | Admitting: Vascular Surgery

## 2021-06-05 VITALS — BP 115/78 | HR 73 | Temp 97.0°F | Ht 64.0 in | Wt 157.4 lb

## 2021-06-05 DIAGNOSIS — Z95828 Presence of other vascular implants and grafts: Secondary | ICD-10-CM

## 2021-06-05 NOTE — Progress Notes (Signed)
[facility] [place]  Patient name: Sabrina [name] MRN: [id number] DOB: 03/15/1946 Sex: female  REASON FOR CONSULT: Evaluate for vena cava filter removal  HPI: Sabrina [name] is a 75 y.o. female, who is here today for discussion of potential vena cava filter removal.  She has a very complicated past history.  She has a long-term history of atrial fibrillation.  She does have a pacemaker.  She had DVT diagnosed in March 2022 and was started on anticoagulation.  She had significant GI bleeding and this was discontinued and she had a vena cava filter placed.  This was at [facility].  She was told that she should have the filter removed in 6 to 9 months.  She request that this be taken care of of in [place].  She also is being evaluated for possible watchman device.  She is on being to avoid chronic anticoagulation.  She currently is not on anticoagulation.  She was recently seen by Dr. Quentin Ore for evaluation of a watchman and was told that she would be on anticoagulation for approximately 4 weeks around the time of the procedure.  She has multiple complaints.  She has swelling in her lower extremities and also aching and has frequent falls related to balance issues.  Past Medical History:  Diagnosis Date   Anemia    Ankle fracture, right    past hx. -"no surgery"   Anxiety    Asthma    CHF (congestive heart failure) ([place]) 2009   Chronic lower back pain    Collagen vascular disease (HCC)    COPD (chronic obstructive pulmonary disease) (HCC)    Depression    Fibromyalgia    GERD (gastroesophageal reflux disease)    Hyperlipidemia    Hypertension    Immature cataract of both eyes    Myocardial infarction ([place])    "I've had a light one; don't know when it happened" (08/27/2017)   Osteoarthritis    Peripheral neuropathy    legs and feet   Persistent atrial fibrillation (HCC)    Tubular adenoma of colon      Family History  Problem Relation Age of Onset   Cancer Mother    Alzheimer's disease Mother    Heart disease Mother    Arthritis Father    Colon cancer Paternal Uncle 81   Asthma Daughter    Arthritis Daughter    Obesity Daughter    Arthritis Son    Hyperlipidemia Son    Obesity Son    Arthritis Son    Obesity Son     SOCIAL HISTORY: Social History   Socioeconomic History   Marital status: Divorced    Spouse name: Not on file   Number of children: 3   Years of education: 2 years of college   Highest education level: Some college, no degree  Occupational History   Occupation: Retired  Tobacco Use   Smoking status: Former    Packs/day: 0.50    Years: 25.00    Pack years: 12.50    Types: Cigarettes    Quit date: 02/26/1993    Years since quitting: 28.2   Smokeless tobacco: Never  Vaping Use   Vaping Use: Never used  Substance and Sexual Activity   Alcohol use: Not Currently    Comment: 08/27/2017 "nothing since Jenna Ardoin 2000s"   Drug use: Not Currently   Sexual activity: Not Currently    Birth control/protection: Surgical  Other Topics Concern   Not  on file  Social History Narrative   ** Merged History Encounter **       Pt is right handed Lives in single story home with her grandson Has 3 adult children Associated degree  Retired Quarry manager   Social Determinants of Radio broadcast assistant Strain: Low Risk    Difficulty of Paying Living Expenses: Not very hard  Food Insecurity: No Food Insecurity   Worried About Charity fundraiser in the Last Year: Never true   Arboriculturist in the Last Year: Never true  Transportation Needs: No Transportation Needs   Lack of Transportation (Medical): No   Lack of Transportation (Non-Medical): No  Physical Activity: Sufficiently Active   Days of Exercise per Week: 3 days   Minutes of Exercise per Session: 60 min  Stress: Stress Concern Present   Feeling of Stress : To some extent  Social Connections: Moderately  Integrated   Frequency of Communication with Friends and Family: More than three times a week   Frequency of Social Gatherings with Friends and Family: Twice a week   Attends Religious Services: 1 to 4 times per year   Active Member of [employer] or Organizations: Yes   Attends Archivist Meetings: 1 to 4 times per year   Marital Status: Divorced  Human resources officer Violence: Not At Risk   Fear of Current or Ex-Partner: No   Emotionally Abused: No   Physically Abused: No   Sexually Abused: No    Allergies  Allergen Reactions   Codeine Nausea And Vomiting    Stomach pain also    Current Outpatient Medications  Medication Sig Dispense Refill   albuterol (VENTOLIN HFA) 108 (90 Base) MCG/ACT inhaler TAKE 2 PUFFS BY MOUTH EVERY 6 HOURS AS NEEDED FOR WHEEZE OR SHORTNESS OF BREATH 18 g 0   alendronate (FOSAMAX) 70 MG tablet Take 1 tablet (70 mg total) by mouth every 7 (seven) days. Take with a full glass of water on an empty stomach. 12 tablet 0   ALPRAZolam (XANAX) 0.5 MG tablet Take 0.5 mg by mouth at bedtime as needed for anxiety.     atorvastatin (LIPITOR) 20 MG tablet Take 1 tablet (20 mg total) by mouth daily. 90 tablet 3   calcium carbonate (OSCAL) 1500 (600 Ca) MG TABS tablet Take by mouth daily with breakfast.     DULoxetine (CYMBALTA) 60 MG capsule TAKE 1 CAPSULE EVERY DAY 30 capsule 0   ferrous sulfate 325 (65 FE) MG tablet Take 325 mg by mouth daily.     fluticasone (FLONASE) 50 MCG/ACT nasal spray SPRAY 2 SPRAYS INTO EACH NOSTRIL EVERY DAY 48 g 1   furosemide (LASIX) 20 MG tablet Take 0.5 tablets (10 mg total) by mouth daily. (MAY TAKE EXTRA 1/2 TAB - '10MG'$  AS NEEDED FOR SWELLING OR 3LB WEIGHT GAIN) 60 tablet 3   gabapentin (NEURONTIN) 100 MG capsule Take 1 capsule (100 mg total) by mouth 2 (two) times daily. Needs to be seen 180 capsule 0   Multiple Vitamins-Minerals (ONE-A-DAY 50 PLUS PO) Take 1 tablet by mouth daily.     Omega-3 Fatty Acids (FISH OIL PO) Take 1 capsule by  mouth daily.     pantoprazole (PROTONIX) 40 MG tablet Take 1 tablet (40 mg total) by mouth 2 (two) times daily. 90 tablet 3   potassium chloride SA (KLOR-CON) 20 MEQ tablet Take 1 tablet (20 mEq total) by mouth daily. 90 tablet 2   PRESCRIPTION MEDICATION Apply 1 application topically  daily as needed (excema). HAL Ointment     SYMBICORT 80-4.5 MCG/ACT inhaler Inhale 2 puffs into the lungs 2 (two) times daily.     triamcinolone ointment (KENALOG) 0.5 % Apply 1 application topically 2 (two) times daily. 30 g 0   Vitamin D, Ergocalciferol, (DRISDOL) 1.25 MG (50000 UNIT) CAPS capsule Take 1 capsule (50,000 Units total) by mouth every 7 (seven) days. 12 capsule 3   No current facility-administered medications for this visit.    REVIEW OF SYSTEMS:  '[X]'$  denotes positive finding, '[ ]'$  denotes negative finding Cardiac  Comments:  Irregular heart rhythm, shortness of breath with activity x                                           Pulmonary    Oxygen at home:    Productive cough:     Wheezing:         Neurologic    Sudden weakness in arms or legs:  x   Sudden numbness in arms or legs:  x   Sudden onset of difficulty speaking or slurred speech:    Temporary loss of vision in one eye:     Problems with dizziness:  x       Gastrointestinal    Blood in stool:  x   Vomited blood:         Genitourinary    Burning when urinating:     Blood in urine:        Psychiatric    Major depression:  x       Hematologic    Bleeding problems: x   Problems with blood clotting too easily:        Skin    Rashes or ulcers: x       Constitutional    Fever or chills:      PHYSICAL EXAM: Vitals:   06/05/21 1432  BP: 115/78  Pulse: 73  Temp: (!) 97 F (36.1 C)  TempSrc: Temporal  SpO2: 98%  Weight: 157 lb 6.4 oz (71.4 kg)  Height: '5\' 4"'$  (1.626 m)    GENERAL: The patient is a well-nourished female, in no acute distress. The vital signs are documented above. CARDIOVASCULAR: 2+  dorsalis pedis pulses bilaterally.  Moderate pitting edema bilaterally. PULMONARY: There is good air exchange  MUSCULOSKELETAL: There are no major deformities or cyanosis. NEUROLOGIC: No focal weakness or paresthesias are detected. SKIN: There are no ulcers or rashes noted. PSYCHIATRIC: The patient has a normal affect.  DATA:  None  MEDICAL ISSUES: By records the patient had a [place] IVC filter placed in March 2022.  I discussed the procedure for removal of this.  I do not feel that she has any need for ongoing filter and she is now 6 months out from her DVT diagnosis.  I explained that this would be done in [place] at [facility] by one of my partners.  I did discuss this with Dr. Monica Martinez today and we will schedule this intervention with him.  We will communicate with Dr. Quentin Ore to coordinate this around the planned watchman procedure   Rosetta Posner, MD [facility] [phone] Pager [phone]  Note: Portions of this report may have been transcribed using voice recognition software.  Every effort has been made to ensure accuracy; however, inadvertent computerized transcription errors  may still be present.

## 2021-06-06 ENCOUNTER — Other Ambulatory Visit: Payer: Self-pay

## 2021-06-07 ENCOUNTER — Other Ambulatory Visit: Payer: Self-pay

## 2021-06-07 ENCOUNTER — Ambulatory Visit (INDEPENDENT_AMBULATORY_CARE_PROVIDER_SITE_OTHER): Payer: Medicare HMO

## 2021-06-07 DIAGNOSIS — E538 Deficiency of other specified B group vitamins: Secondary | ICD-10-CM

## 2021-06-07 MED ORDER — CYANOCOBALAMIN 1000 MCG/ML IJ SOLN
1000.0000 ug | Freq: Once | INTRAMUSCULAR | Status: AC
  Administered 2021-06-07: 1000 ug via INTRAMUSCULAR

## 2021-06-14 ENCOUNTER — Other Ambulatory Visit: Payer: Self-pay

## 2021-06-14 ENCOUNTER — Other Ambulatory Visit: Payer: Self-pay | Admitting: Family

## 2021-06-14 ENCOUNTER — Ambulatory Visit (INDEPENDENT_AMBULATORY_CARE_PROVIDER_SITE_OTHER): Payer: Medicare HMO | Admitting: *Deleted

## 2021-06-14 DIAGNOSIS — E538 Deficiency of other specified B group vitamins: Secondary | ICD-10-CM

## 2021-06-14 DIAGNOSIS — F411 Generalized anxiety disorder: Secondary | ICD-10-CM

## 2021-06-14 DIAGNOSIS — Z23 Encounter for immunization: Secondary | ICD-10-CM | POA: Diagnosis not present

## 2021-06-14 MED ORDER — CYANOCOBALAMIN 1000 MCG/ML IJ SOLN
1000.0000 ug | Freq: Once | INTRAMUSCULAR | Status: AC
  Administered 2021-06-14: 1000 ug via INTRAMUSCULAR

## 2021-06-20 ENCOUNTER — Other Ambulatory Visit: Payer: Self-pay

## 2021-06-20 ENCOUNTER — Ambulatory Visit (HOSPITAL_COMMUNITY)
Admission: RE | Admit: 2021-06-20 | Discharge: 2021-06-20 | Disposition: A | Discharge status: Home / Self Care | Payer: Medicare HMO | Attending: Vascular Surgery | Admitting: Vascular Surgery

## 2021-06-20 ENCOUNTER — Encounter (HOSPITAL_COMMUNITY)
Admission: RE | Disposition: A | Discharge status: Home / Self Care | Payer: Self-pay | Source: Home / Self Care | Attending: Vascular Surgery

## 2021-06-20 ENCOUNTER — Encounter (HOSPITAL_COMMUNITY): Payer: Self-pay | Admitting: Vascular Surgery

## 2021-06-20 DIAGNOSIS — Z8719 Personal history of other diseases of the digestive system: Secondary | ICD-10-CM | POA: Diagnosis not present

## 2021-06-20 DIAGNOSIS — Z86718 Personal history of other venous thrombosis and embolism: Secondary | ICD-10-CM | POA: Insufficient documentation

## 2021-06-20 DIAGNOSIS — Z87891 Personal history of nicotine dependence: Secondary | ICD-10-CM | POA: Diagnosis not present

## 2021-06-20 DIAGNOSIS — Z950 Presence of cardiac pacemaker: Secondary | ICD-10-CM | POA: Insufficient documentation

## 2021-06-20 DIAGNOSIS — Z885 Allergy status to narcotic agent status: Secondary | ICD-10-CM | POA: Insufficient documentation

## 2021-06-20 DIAGNOSIS — Z79899 Other long term (current) drug therapy: Secondary | ICD-10-CM | POA: Insufficient documentation

## 2021-06-20 DIAGNOSIS — Z4589 Encounter for adjustment and management of other implanted devices: Secondary | ICD-10-CM | POA: Diagnosis not present

## 2021-06-20 DIAGNOSIS — Z95828 Presence of other vascular implants and grafts: Secondary | ICD-10-CM | POA: Diagnosis not present

## 2021-06-20 DIAGNOSIS — I4891 Unspecified atrial fibrillation: Secondary | ICD-10-CM | POA: Insufficient documentation

## 2021-06-20 DIAGNOSIS — Z7951 Long term (current) use of inhaled steroids: Secondary | ICD-10-CM | POA: Diagnosis not present

## 2021-06-20 HISTORY — PX: IVC FILTER REMOVAL: CATH118246

## 2021-06-20 LAB — POCT I-STAT, CHEM 8
BUN: 23 mg/dL (ref 8–23)
Calcium, Ion: 1.11 mmol/L — ABNORMAL LOW (ref 1.15–1.40)
Chloride: 101 mmol/L (ref 98–111)
Creatinine, Ser: 0.8 mg/dL (ref 0.44–1.00)
Glucose, Bld: 90 mg/dL (ref 70–99)
HCT: 41 % (ref 36.0–46.0)
Hemoglobin: 13.9 g/dL (ref 12.0–15.0)
Potassium: 3.7 mmol/L (ref 3.5–5.1)
Sodium: 142 mmol/L (ref 135–145)
TCO2: 33 mmol/L — ABNORMAL HIGH (ref 22–32)

## 2021-06-20 SURGERY — IVC FILTER REMOVAL
Anesthesia: LOCAL

## 2021-06-20 MED ORDER — MIDAZOLAM HCL 2 MG/2ML IJ SOLN
INTRAMUSCULAR | Status: AC
  Filled 2021-06-20: qty 2

## 2021-06-20 MED ORDER — HEPARIN (PORCINE) IN NACL 1000-0.9 UT/500ML-% IV SOLN
INTRAVENOUS | Status: DC | PRN
  Administered 2021-06-20: 500 mL

## 2021-06-20 MED ORDER — LIDOCAINE HCL (PF) 1 % IJ SOLN
INTRAMUSCULAR | Status: DC | PRN
  Administered 2021-06-20: 5 mL via INTRADERMAL

## 2021-06-20 MED ORDER — HEPARIN (PORCINE) IN NACL 1000-0.9 UT/500ML-% IV SOLN
INTRAVENOUS | Status: AC
  Filled 2021-06-20: qty 500

## 2021-06-20 MED ORDER — SODIUM CHLORIDE 0.9 % IV SOLN: INTRAVENOUS | Status: DC

## 2021-06-20 MED ORDER — MIDAZOLAM HCL 2 MG/2ML IJ SOLN
INTRAMUSCULAR | Status: DC | PRN
  Administered 2021-06-20: 1 mg via INTRAVENOUS

## 2021-06-20 MED ORDER — IODIXANOL 320 MG/ML IV SOLN
INTRAVENOUS | Status: DC | PRN
  Administered 2021-06-20: 15 mL

## 2021-06-20 MED ORDER — LIDOCAINE HCL (PF) 1 % IJ SOLN
INTRAMUSCULAR | Status: AC
  Filled 2021-06-20: qty 30

## 2021-06-20 MED ORDER — FENTANYL CITRATE (PF) 100 MCG/2ML IJ SOLN
INTRAMUSCULAR | Status: AC
  Filled 2021-06-20: qty 2

## 2021-06-20 MED ORDER — FENTANYL CITRATE (PF) 100 MCG/2ML IJ SOLN
INTRAMUSCULAR | Status: DC | PRN
  Administered 2021-06-20: 25 ug via INTRAVENOUS

## 2021-06-20 SURGICAL SUPPLY — 12 items
CATH ANGIO 5F BER2 65CM (CATHETERS) ×2 IMPLANT
DEVICE ONE SNARE 10MM (MISCELLANEOUS) ×2 IMPLANT
DEVICE ONE SNARE 25MM (VASCULAR PRODUCTS) ×2 IMPLANT
KIT MICROPUNCTURE NIT STIFF (SHEATH) ×2 IMPLANT
KIT PV (KITS) ×2 IMPLANT
PROTECTION STATION PRESSURIZED (MISCELLANEOUS) ×2
SHEATH DESTINATION 8F 45CM (SHEATH) ×2 IMPLANT
SHEATH PINNACLE 5F 10CM (SHEATH) ×2 IMPLANT
SHEATH PROBE COVER 6X72 (BAG) ×2 IMPLANT
STATION PROTECTION PRESSURIZED (MISCELLANEOUS) ×1 IMPLANT
TRAY PV CATH (CUSTOM PROCEDURE TRAY) ×2 IMPLANT
WIRE STARTER BENTSON 035X150 (WIRE) ×2 IMPLANT

## 2021-06-20 NOTE — H&P (Signed)
History and Physical Interval Note:  06/20/2021 9:25 AM  Jenell Milliner  has presented today for surgery, with the diagnosis of existing IVC filter.  The various methods of treatment have been discussed with the patient and family. After consideration of risks, benefits and other options for treatment, the patient has consented to  Procedure(s): IVC FILTER REMOVAL (N/A) as a surgical intervention.  The patient's history has been reviewed, patient examined, no change in status, stable for surgery.  I have reviewed the patient's chart and labs.  Questions were answered to the patient's satisfaction.    IVC filter removal  Marty Heck  [facility] [place]   Patient name: Sabrina [name]   MRN: [id number]        DOB: Nov 19, 1945        Sex: female   REASON FOR CONSULT: Evaluate for vena cava filter removal   HPI: JULIETTA BATTERMAN is a 75 y.o. female, who is here today for discussion of potential vena cava filter removal.  She has a very complicated past history.  She has a long-term history of atrial fibrillation.  She does have a pacemaker.  She had DVT diagnosed in March 2022 and was started on anticoagulation.  She had significant GI bleeding and this was discontinued and she had a vena cava filter placed.  This was at [facility].  She was told that she should have the filter removed in 6 to 9 months.  She request that this be taken care of of in [place].  She also is being evaluated for possible watchman device.  She is on being to avoid chronic anticoagulation.  She currently is not on anticoagulation.  She was recently seen by Dr. Quentin Ore for evaluation of a watchman and was told that she would be on anticoagulation for approximately 4 weeks around the time of the procedure.  She has multiple complaints.  She has swelling in her lower extremities and also aching and has frequent falls related to balance issues.       Past Medical History:   Diagnosis Date   Anemia     Ankle fracture, right      past hx. -"no surgery"   Anxiety     Asthma     CHF (congestive heart failure) ([place]) 2009   Chronic lower back pain     Collagen vascular disease (HCC)     COPD (chronic obstructive pulmonary disease) (HCC)     Depression     Fibromyalgia     GERD (gastroesophageal reflux disease)     Hyperlipidemia     Hypertension     Immature cataract of both eyes     Myocardial infarction ([place])      "I've had a light one; don't know when it happened" (08/27/2017)   Osteoarthritis     Peripheral neuropathy      legs and feet   Persistent atrial fibrillation (HCC)     Tubular adenoma of colon             Family History  Problem Relation Age of Onset   Cancer Mother     Alzheimer's disease Mother     Heart disease Mother     Arthritis Father     Colon cancer Paternal Uncle 33   Asthma Daughter     Arthritis Daughter     Obesity Daughter     Arthritis Son     Hyperlipidemia Son     Obesity  Son     Arthritis Son     Obesity Son        SOCIAL HISTORY: Social History         Socioeconomic History   Marital status: Divorced      Spouse name: Not on file   Number of children: 3   Years of education: 2 years of college   Highest education level: Some college, no degree  Occupational History   Occupation: Retired  Tobacco Use   Smoking status: Former      Packs/day: 0.50      Years: 25.00      Pack years: 12.50      Types: Cigarettes      Quit date: 02/26/1993      Years since quitting: 28.2   Smokeless tobacco: Never  Vaping Use   Vaping Use: Never used  Substance and Sexual Activity   Alcohol use: Not Currently      Comment: 08/27/2017 "nothing since early 2000s"   Drug use: Not Currently   Sexual activity: Not Currently      Birth control/protection: Surgical  Other Topics Concern   Not on file  Social History Narrative    ** Merged History Encounter **         Pt is right handed Lives in single story home  with her grandson Has 3 adult children Associated degree  Retired Quarry manager    Social Determinants of Scientist, research (life sciences) Strain: Low Risk    Difficulty of Paying Living Expenses: Not very hard  Food Insecurity: No Food Insecurity   Worried About Charity fundraiser in the Last Year: Never true   Arboriculturist in the Last Year: Never true  Transportation Needs: No Transportation Needs   Lack of Transportation (Medical): No   Lack of Transportation (Non-Medical): No  Physical Activity: Sufficiently Active   Days of Exercise per Week: 3 days   Minutes of Exercise per Session: 60 min  Stress: Stress Concern Present   Feeling of Stress : To some extent  Social Connections: Moderately Integrated   Frequency of Communication with Friends and Family: More than three times a week   Frequency of Social Gatherings with Friends and Family: Twice a week   Attends Religious Services: 1 to 4 times per year   Active Member of [employer] or Organizations: Yes   Attends Archivist Meetings: 1 to 4 times per year   Marital Status: Divorced  Human resources officer Violence: Not At Risk   Fear of Current or Ex-Partner: No   Emotionally Abused: No   Physically Abused: No   Sexually Abused: No           Allergies  Allergen Reactions   Codeine Nausea And Vomiting      Stomach pain also            Current Outpatient Medications  Medication Sig Dispense Refill   albuterol (VENTOLIN HFA) 108 (90 Base) MCG/ACT inhaler TAKE 2 PUFFS BY MOUTH EVERY 6 HOURS AS NEEDED FOR WHEEZE OR SHORTNESS OF BREATH 18 g 0   alendronate (FOSAMAX) 70 MG tablet Take 1 tablet (70 mg total) by mouth every 7 (seven) days. Take with a full glass of water on an empty stomach. 12 tablet 0   ALPRAZolam (XANAX) 0.5 MG tablet Take 0.5 mg by mouth at bedtime as needed for anxiety.       atorvastatin (LIPITOR) 20 MG tablet Take 1  tablet (20 mg total) by mouth daily. 90 tablet 3   calcium carbonate (OSCAL) 1500 (600 Ca)  MG TABS tablet Take by mouth daily with breakfast.       DULoxetine (CYMBALTA) 60 MG capsule TAKE 1 CAPSULE EVERY DAY 30 capsule 0   ferrous sulfate 325 (65 FE) MG tablet Take 325 mg by mouth daily.       fluticasone (FLONASE) 50 MCG/ACT nasal spray SPRAY 2 SPRAYS INTO EACH NOSTRIL EVERY DAY 48 g 1   furosemide (LASIX) 20 MG tablet Take 0.5 tablets (10 mg total) by mouth daily. (MAY TAKE EXTRA 1/2 TAB - 10MG  AS NEEDED FOR SWELLING OR 3LB WEIGHT GAIN) 60 tablet 3   gabapentin (NEURONTIN) 100 MG capsule Take 1 capsule (100 mg total) by mouth 2 (two) times daily. Needs to be seen 180 capsule 0   Multiple Vitamins-Minerals (ONE-A-DAY 50 PLUS PO) Take 1 tablet by mouth daily.       Omega-3 Fatty Acids (FISH OIL PO) Take 1 capsule by mouth daily.       pantoprazole (PROTONIX) 40 MG tablet Take 1 tablet (40 mg total) by mouth 2 (two) times daily. 90 tablet 3   potassium chloride SA (KLOR-CON) 20 MEQ tablet Take 1 tablet (20 mEq total) by mouth daily. 90 tablet 2   PRESCRIPTION MEDICATION Apply 1 application topically daily as needed (excema). HAL Ointment       SYMBICORT 80-4.5 MCG/ACT inhaler Inhale 2 puffs into the lungs 2 (two) times daily.       triamcinolone ointment (KENALOG) 0.5 % Apply 1 application topically 2 (two) times daily. 30 g 0   Vitamin D, Ergocalciferol, (DRISDOL) 1.25 MG (50000 UNIT) CAPS capsule Take 1 capsule (50,000 Units total) by mouth every 7 (seven) days. 12 capsule 3    No current facility-administered medications for this visit.      REVIEW OF SYSTEMS:  [X]  denotes positive finding, [ ]  denotes negative finding Cardiac   Comments:  Irregular heart rhythm, shortness of breath with activity x                                                                          Pulmonary      Oxygen at home:      Productive cough:       Wheezing:              Neurologic      Sudden weakness in arms or legs:  x    Sudden numbness in arms or legs:  x    Sudden onset of  difficulty speaking or slurred speech:      Temporary loss of vision in one eye:       Problems with dizziness:  x           Gastrointestinal      Blood in stool:  x    Vomited blood:              Genitourinary      Burning when urinating:       Blood in urine:             Psychiatric      Major depression:  x  Hematologic      Bleeding problems: x    Problems with blood clotting too easily:             Skin      Rashes or ulcers: x           Constitutional      Fever or chills:          PHYSICAL EXAM:    Vitals:    06/05/21 1432  BP: 115/78  Pulse: 73  Temp: (!) 97 F (36.1 C)  TempSrc: Temporal  SpO2: 98%  Weight: 157 lb 6.4 oz (71.4 kg)  Height: 5\' 4"  (1.626 m)      GENERAL: The patient is a well-nourished female, in no acute distress. The vital signs are documented above. CARDIOVASCULAR: 2+ dorsalis pedis pulses bilaterally.  Moderate pitting edema bilaterally. PULMONARY: There is good air exchange  MUSCULOSKELETAL: There are no major deformities or cyanosis. NEUROLOGIC: No focal weakness or paresthesias are detected. SKIN: There are no ulcers or rashes noted. PSYCHIATRIC: The patient has a normal affect.   DATA:  None   MEDICAL ISSUES: By records the patient had a [place] IVC filter placed in March 2022.  I discussed the procedure for removal of this.  I do not feel that she has any need for ongoing filter and she is now 6 months out from her DVT diagnosis.  I explained that this would be done in [place] at [facility] by one of my partners.  I did discuss this with Dr. Monica Martinez today and we will schedule this intervention with him.  We will communicate with Dr. Quentin Ore to coordinate this around the planned watchman procedure     Rosetta Posner, MD [facility] Tel [phone] Pager [phone]

## 2021-06-20 NOTE — Progress Notes (Signed)
Discharge instructions reviewed with pt and her grandson. Both voice understanding.

## 2021-06-20 NOTE — Op Note (Addendum)
    Patient name: Sabrina [name] MRN: [id number] DOB: Nov 22, 1945 Sex: female  06/20/2021 Pre-operative Diagnosis: IVC filter Post-operative diagnosis:  Same Surgeon:  Marty Heck, MD Procedure Performed: 1.  Ultrasound-guided access right internal jugular vein 2.  Inferior venacavogram 3.  Removal of IVC filter 4.  26 minutes of monitored moderate conscious sedation time  Indications: Patient is a 74 year old female who was evaluated by Dr. Donnetta Hutching for removal of IVC filter.  She had an IVC filter placed in at [facility] and was told to have this removed in 6 to 9 months following a GI bleed.  She presented to Dr. Donnetta Hutching to discuss IVC filter removal.  She is here today after risk benefits discussed with plans for removal.  Findings:  Ultrasound-guided access right internal jugular vein.  Ultimately long 8 French sheath was advanced into the inferior vena cava.  Venacavogram showed no evidence of thrombus.  The IVC filter was snared with a 25 snare one and retrieved in the 8 French sheath.  The entire filter was removed without complication.  She remained stable during entire case.   Procedure:  The patient was identified in the holding area and taken to room 8.  The patient was then placed supine on the table and prepped and draped in the usual sterile fashion.  A time out was called.  Ultrasound was used to evaluate the right internal jugular vein.  This was patent and image was saved.  The right internal jugular vein was accessed under ultrasound guidance with a micro access needle, placed a microwire, and then a micro sheath.  I tried to get a Bentson wire into the inferior vena cava but this went into the right side of the heart.  I then placed a short 5 [place] sheath.  I used a directional catheter to get my wire into the IVC.  I then placed a long 8 [place] destination sheath in the right internal jugular vein.  Inferior venacavogram was obtained.  I initially used a small  10 round one snare and had to upsize to a 25 round one snare with angled catheter and I was able to snare the hook of the IVC filter under fluoroscopy and the filter was retrieved in its entirety and removed in the 8 [place] sheath.  She remained stable during the case.  Taken holding with no issues.  Dry sterile dressings were applied to her neck.   Complication: None  Condition: Stable  Marty Heck, MD [facility] [place] Office: [place]

## 2021-06-27 ENCOUNTER — Telehealth: Payer: Self-pay

## 2021-06-27 NOTE — Telephone Encounter (Signed)
Discussed with Dr. Quentin Ore this AM. The patient had her IVC filter removed last week. Will schedule her with Structural APP for evaluation, to start A/C, and arrange Watchman implant.  Attempted to call patient. The phone rang several times and no one or VM picked up to leave message.  Will try again later.

## 2021-06-28 ENCOUNTER — Ambulatory Visit (INDEPENDENT_AMBULATORY_CARE_PROVIDER_SITE_OTHER): Payer: Medicare HMO

## 2021-06-28 DIAGNOSIS — I442 Atrioventricular block, complete: Secondary | ICD-10-CM

## 2021-06-28 NOTE — Telephone Encounter (Signed)
Scheduled the patient for pre-Watchman visit with Kathyrn Drown 10/12. She understands she will start Eliquis at that time and agrees to 11/10 procedure date. She was grateful for call and agrees with plan.

## 2021-07-01 ENCOUNTER — Other Ambulatory Visit: Payer: Self-pay

## 2021-07-01 DIAGNOSIS — I4821 Permanent atrial fibrillation: Secondary | ICD-10-CM

## 2021-07-01 DIAGNOSIS — K922 Gastrointestinal hemorrhage, unspecified: Secondary | ICD-10-CM

## 2021-07-02 LAB — CUP PACEART REMOTE DEVICE CHECK
Battery Remaining Longevity: 64 mo
Battery Voltage: 2.89 V
Brady Statistic AP VP Percent: 0.23 %
Brady Statistic AP VS Percent: 99.67 %
Brady Statistic AS VP Percent: 0 %
Brady Statistic AS VS Percent: 0.09 %
Brady Statistic RA Percent Paced: 100 %
Brady Statistic RV Percent Paced: 0.23 %
Date Time Interrogation Session: 20221007125216
Implantable Lead Implant Date: 20181206
Implantable Lead Implant Date: 20181206
Implantable Lead Location: 753860
Implantable Lead Location: 753860
Implantable Lead Model: 3830
Implantable Lead Model: 5076
Implantable Pulse Generator Implant Date: 20181206
Lead Channel Impedance Value: 304 Ohm
Lead Channel Impedance Value: 361 Ohm
Lead Channel Impedance Value: 380 Ohm
Lead Channel Impedance Value: 513 Ohm
Lead Channel Sensing Intrinsic Amplitude: 4.375 mV
Lead Channel Sensing Intrinsic Amplitude: 4.5 mV
Lead Channel Sensing Intrinsic Amplitude: 7 mV
Lead Channel Sensing Intrinsic Amplitude: 7 mV
Lead Channel Setting Pacing Amplitude: 2 V
Lead Channel Setting Pacing Amplitude: 2.5 V
Lead Channel Setting Pacing Pulse Width: 0.3 ms
Lead Channel Setting Sensing Sensitivity: 2 mV

## 2021-07-02 NOTE — Progress Notes (Signed)
[facility] [place]                                     Cardiology Office Note:    Date:  07/03/2021   ID:  Sabrina [name], DOB 12-19-1945, MRN [id number]  PCP:  Sharion Balloon, FNP  [facility] Cardiologist:  None  [facility] Electrophysiologist:  [name], MD   Referring MD: Sharion Balloon, FNP   Chief Complaint  Patient presents with   Follow-up    Pre-Watchman evaluation    History of Present Illness:    Sabrina [name] is a 75 y.o. female with a hx of atrial fibrillation not on anticoagulation secondary to GI bleed, COPD, GERD, HTN, DVT, DVT with IVC filter placed in [facility], sinus node dysfunction s/p PPM placement followed by Dr. Lovena Le.   She has a hx of GI bleed while on anticoagulation which was initially started after the diagnosis of DVT. Due to her bleeding, AC was discontinued and an IVC filter was placed. The plan was to have this removed. This was performed 06/20/21 by Dr. Carlis Abbott with [facility]. Due to concern of stroke off anticoagulation, she was referred to Dr. Quentin Ore to discuss Watchman implantation, now scheduled for 08/01/21.   She underwent pre-implant cardiac CT 05/15/21 which showed windsock LAA with landing zone 21.3 by 29 mm suitable for a 27 mm FLX Watchman device. Plan is for low dose Eliquis 2.5mg  BID 4 weeks prior to implant.   Today she is here alone and is doing well from a CV standpoint. She is asking about resuming activity and driving post implant which we reviewed. As above, she will be started on low dose Eliquis. We discussed watching for signs of bleeding in her stool or urine. She understands. She denies chest pain, palpitations, LE edema, orthopnea, dizziness, presyncopal or syncope.    Past Medical History:  Diagnosis Date   Anemia    Ankle fracture, right    past hx. -"no surgery"   Anxiety    Asthma    CHF (congestive heart failure) ([place]) 2009   Chronic lower back pain     Collagen vascular disease (HCC)    COPD (chronic obstructive pulmonary disease) (HCC)    Depression    Fibromyalgia    GERD (gastroesophageal reflux disease)    Hyperlipidemia    Hypertension    Immature cataract of both eyes    Myocardial infarction ([place])    "I've had a light one; don't know when it happened" (08/27/2017)   Osteoarthritis    Peripheral neuropathy    legs and feet   Persistent atrial fibrillation (HCC)    Tubular adenoma of colon     Past Surgical History:  Procedure Laterality Date   APPENDECTOMY     AV NODE ABLATION N/A 08/27/2017   Procedure: AV NODE ABLATION;  Surgeon: Evans Lance, MD;  Location: [place] [facility];  Service: Cardiovascular;  Laterality: N/A;   CHOLECYSTECTOMY OPEN  1978   DILATION AND CURETTAGE OF UTERUS     FEMUR FRACTURE SURGERY Left 2013   "put 7" rod in it"   FRACTURE SURGERY     IVC FILTER REMOVAL N/A 06/20/2021   Procedure: IVC FILTER REMOVAL;  Surgeon: Marty Heck, MD;  Location: [place] [facility];  Service: Cardiovascular;  Laterality: N/A;   LAPAROSCOPY  08/22/2016  Procedure: LAPAROSCOPY DIAGNOSTIC;  Surgeon: [name], MD;  Location: [facility];  Service: General;;   MEDIAL PARTIAL KNEE REPLACEMENT Right 2005   "@ [facility]"   PACEMAKER IMPLANT N/A 08/27/2017   Procedure: PACEMAKER IMPLANT;  Surgeon: Evans Lance, MD;  Location: [place] [facility];  Service: Cardiovascular;  Laterality: N/A;   ROUX-EN-Y GASTRIC BYPASS  2002   [place]   TONSILLECTOMY  1944   TUBAL LIGATION     VAGINAL HYSTERECTOMY     Current Medications: Current Meds  Medication Sig   apixaban (ELIQUIS) 2.5 MG TABS tablet Take 1 tablet (2.5 mg total) by mouth 2 (two) times daily.     Allergies:   Codeine   Social History   Socioeconomic History   Marital status: Divorced    Spouse name: Not on file   Number of children: 3   Years of education: 2 years of college   Highest education level: Some  college, no degree  Occupational History   Occupation: Retired  Tobacco Use   Smoking status: Former    Packs/day: 0.50    Years: 25.00    Pack years: 12.50    Types: Cigarettes    Quit date: 02/26/1993    Years since quitting: 28.3   Smokeless tobacco: Never  Vaping Use   Vaping Use: Never used  Substance and Sexual Activity   Alcohol use: Not Currently    Comment: 08/27/2017 "nothing since early 2000s"   Drug use: Not Currently   Sexual activity: Not Currently    Birth control/protection: Surgical  Other Topics Concern   Not on file  Social History Narrative   ** Merged History Encounter **       Pt is right handed Lives in single story home with her grandson Has 3 adult children Associated degree  Retired Quarry manager   Social Determinants of Radio broadcast assistant Strain: Low Risk    Difficulty of Paying Living Expenses: Not very hard  Food Insecurity: No Food Insecurity   Worried About Charity fundraiser in the Last Year: Never true   Arboriculturist in the Last Year: Never true  Transportation Needs: No Transportation Needs   Lack of Transportation (Medical): No   Lack of Transportation (Non-Medical): No  Physical Activity: Sufficiently Active   Days of Exercise per Week: 3 days   Minutes of Exercise per Session: 60 min  Stress: Stress Concern Present   Feeling of Stress : To some extent  Social Connections: Moderately Integrated   Frequency of Communication with Friends and Family: More than three times a week   Frequency of Social Gatherings with Friends and Family: Twice a week   Attends Religious Services: 1 to 4 times per year   Active Member of [employer] or Organizations: Yes   Attends Archivist Meetings: 1 to 4 times per year   Marital Status: Divorced     Family History: The patient's family history includes Alzheimer's disease in her mother; Arthritis in her daughter, father, son, and son; Asthma in her daughter; Cancer in her mother; Colon  cancer (age of onset: 62) in her paternal uncle; Heart disease in her mother; Hyperlipidemia in her son; Obesity in her daughter, son, and son.  ROS:   Please see the history of present illness.    All other systems reviewed and are negative.  EKGs/Labs/Other Studies Reviewed:    The following studies were reviewed today:  CCTA 05/15/21:  IMPRESSION: 1. Windsock LAA with landing zone 21.3 by 29 mm suitable for a 27 mm FLX Watchman device   2. Mid posterior trans septal would appear to be most co-axial with ostium of LAA   3.  Normal PV anatomy   4.  Severe LAE, moderate RAE   IMPRESSION: No acute or significant extracardiac findings.   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 Apparent resolution of dilation of the excluded stomach below the diaphragm. Correlate with any GI symptoms.  IVC filter explant:  06/20/2021 Pre-operative Diagnosis: IVC filter Post-operative diagnosis:  Same Surgeon:  Marty Heck, MD Procedure Performed: 1.  Ultrasound-guided access right internal jugular vein 2.  Inferior venacavogram 3.  Removal of IVC filter 4.  26 minutes of monitored moderate conscious sedation time  Echocardiogram 05/31/21:  Left ventricular ejection fraction, by estimation, is 60 to 65%. The left ventricle has normal function. The left ventricle has no regional wall motion abnormalities. Left ventricular diastolic parameters are indeterminate. 1. 2. Right ventricular systolic function is normal. The right ventricular size is normal. 3. Left atrial size was mildly dilated. 4. Right atrial size was mildly dilated. The mitral valve is abnormal. Mild mitral valve regurgitation. No evidence of mitral stenosis. 5. The aortic valve is tricuspid. There is mild calcification of the aortic valve. There is mild thickening of the aortic valve. Aortic valve regurgitation is mild.  No aortic stenosis is present. 6. The inferior vena cava is normal in size with greater than 50% respiratory variability, suggesting right atrial pressure of 3 mmHg.  EKG:  EKG is ordered today.  The ekg ordered today demonstrates atrial fibrillation with IVCD, HR 74bpm   Recent Labs: 03/19/2021: Platelets 350 05/07/2021: ALT 15 06/20/2021: BUN 23; Creatinine, Ser 0.80; Hemoglobin 13.9; Potassium 3.7; Sodium 142  Recent Lipid Panel    Component Value Date/Time   CHOL 199 09/30/2019 1018   TRIG 137 09/30/2019 1018   HDL 60 09/30/2019 1018   CHOLHDL 3.3 09/30/2019 1018   LDLCALC 115 (H) 09/30/2019 1018   Physical Exam:    VS:  BP 100/70   Pulse 74   Ht 5\' 4"  (1.626 m)   Wt 154 lb 6.4 oz (70 kg)   SpO2 99%   BMI 26.50 kg/m     Wt Readings from Last 3 Encounters:  07/03/21 154 lb 6.4 oz (70 kg)  06/20/21 157 lb (71.2 kg)  06/05/21 157 lb 6.4 oz (71.4 kg)    General: Well developed, well nourished, NAD Neck: Negative for carotid bruits. No JVD Lungs:Clear to ausculation bilaterally. Breathing is unlabored. Cardiovascular: Irregularly irregular. No murmurs Extremities: No edema. Neuro: Alert and oriented. No focal deficits. No facial asymmetry. MAE spontaneously. Psych: Responds to questions appropriately with normal affect.    ASSESSMENT/PLAN:    1. Permanent atrial fibrillation: -Has a known hx of permanent atrial fibrillation not on anticoagulation due to hx of GI bleed. She was felt to be a good candidate for [facility] implantation and was referred to D.r Quentin Ore. She is scheduled for 08/01/21. We will start low dose Eliquis 2.5mg  PO BID today for 4 weeks of therapy prior to implantation. She understands that she will need to remain on this until the 45 day TEE to assess for adequate seal. Discussed the need to watch for bleeding in stool or urine and to notify our team for any concerns.   -CBC, BMET today  -  CHA2DS2-VASc score of 4 and unadjusted ischemic stroke rate of  6.7% per year.   2. PPM: -Last interrogation 06/28/21 with normal device function, 2 episodes of NSVT, brief 1:1.  -Continue follow up with EP   3. GI bleeding: -Understand there is a risk of recurrence with low dose Eliquis>>discussed how to monitor  -CBC, BMET today   4. Prior IVC filter: -Removed 06/20/21 per Dr. Carlis Abbott  -Site looks great   5. 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 Apparent resolution of dilation of the excluded stomach below the diaphragm. Correlate with any GI symptoms. -No GI complaints today    Medication Adjustments/Labs and Tests Ordered: Current medicines are reviewed at length with the patient today.  Concerns regarding medicines are outlined above.  Orders Placed This Encounter  Procedures   Basic metabolic panel   CBC   EKG 12-Lead   Meds ordered this encounter  Medications   furosemide (LASIX) 40 MG tablet    Sig: Take 1-1.5 tablets (40-60 mg total) by mouth See admin instructions. Take 1.5 tablets (60 mg) by mouth once daily in the morning & may take an additional tablet (40 mg) in the evening if needed for fluid retention.    Dispense:  180 tablet    Refill:  3   apixaban (ELIQUIS) 2.5 MG TABS tablet    Sig: Take 1 tablet (2.5 mg total) by mouth 2 (two) times daily.    Dispense:  60 tablet    Refill:  11    Patient Instructions  Medication Instructions:  Start Eliquis 2.5 mg twice a day   *If you need a refill on your cardiac medications before your next appointment, please call your pharmacy*   Lab Work: Bmp, Cbc- Today   If you have labs (blood work) drawn today and your tests are completely normal, you will receive your results only by: [place] (if you have MyChart) OR A paper copy in the mail If you have any lab test that is abnormal or we need to change your treatment, we will call you to review the  results.   Testing/Procedures: None ordered    Follow-Up: Follow up as scheduled    Other Instructions None     Signed, Kathyrn Drown, NP  07/03/2021 4:09 PM    [facility]

## 2021-07-03 ENCOUNTER — Ambulatory Visit (INDEPENDENT_AMBULATORY_CARE_PROVIDER_SITE_OTHER): Payer: Medicare HMO | Admitting: Cardiology

## 2021-07-03 ENCOUNTER — Encounter: Payer: Self-pay | Admitting: Cardiology

## 2021-07-03 ENCOUNTER — Other Ambulatory Visit: Payer: Self-pay

## 2021-07-03 VITALS — BP 100/70 | HR 74 | Ht 64.0 in | Wt 154.4 lb

## 2021-07-03 DIAGNOSIS — Z950 Presence of cardiac pacemaker: Secondary | ICD-10-CM

## 2021-07-03 DIAGNOSIS — Z79899 Other long term (current) drug therapy: Secondary | ICD-10-CM

## 2021-07-03 DIAGNOSIS — Z01818 Encounter for other preprocedural examination: Secondary | ICD-10-CM

## 2021-07-03 DIAGNOSIS — I4821 Permanent atrial fibrillation: Secondary | ICD-10-CM | POA: Diagnosis not present

## 2021-07-03 DIAGNOSIS — K922 Gastrointestinal hemorrhage, unspecified: Secondary | ICD-10-CM

## 2021-07-03 MED ORDER — APIXABAN 2.5 MG PO TABS: 2.5000 mg | ORAL_TABLET | Freq: Two times a day (BID) | ORAL | 11 refills | Status: DC

## 2021-07-03 MED ORDER — FUROSEMIDE 40 MG PO TABS: 40.0000 mg | ORAL_TABLET | ORAL | 3 refills | Status: DC

## 2021-07-03 NOTE — Patient Instructions (Signed)
Medication Instructions:  Start Eliquis 2.5 mg twice a day   *If you need a refill on your cardiac medications before your next appointment, please call your pharmacy*   Lab Work: Bmp, Cbc- Today   If you have labs (blood work) drawn today and your tests are completely normal, you will receive your results only by: [facility] [place] (if you have MyChart) OR A paper copy in the mail If you have any lab test that is abnormal or we need to change your treatment, we will call you to review the results.   Testing/Procedures: None ordered    Follow-Up: Follow up as scheduled    Other Instructions None

## 2021-07-04 ENCOUNTER — Telehealth: Payer: Self-pay

## 2021-07-04 LAB — BASIC METABOLIC PANEL
BUN/Creatinine Ratio: 17 (ref 12–28)
BUN: 17 mg/dL (ref 8–27)
CO2: 28 mmol/L (ref 20–29)
Calcium: 9.6 mg/dL (ref 8.7–10.3)
Chloride: 100 mmol/L (ref 96–106)
Creatinine, Ser: 0.99 mg/dL (ref 0.57–1.00)
Glucose: 92 mg/dL (ref 70–99)
Potassium: 3.5 mmol/L (ref 3.5–5.2)
Sodium: 143 mmol/L (ref 134–144)
eGFR: 60 mL/min/{1.73_m2} (ref >59)

## 2021-07-04 LAB — CBC
Hematocrit: 40 % (ref 34.0–46.6)
Hemoglobin: 13 g/dL (ref 11.1–15.9)
MCH: 27.7 pg (ref 26.6–33.0)
MCHC: 32.5 g/dL (ref 31.5–35.7)
MCV: 85 fL (ref 79–97)
Platelets: 387 10*3/uL (ref 150–450)
RBC: 4.7 x10E6/uL (ref 3.77–5.28)
RDW: 14.8 % (ref 11.7–15.4)
WBC: 7.9 10*3/uL (ref 3.4–10.8)

## 2021-07-04 MED ORDER — APIXABAN 2.5 MG PO TABS: 2.5000 mg | ORAL_TABLET | Freq: Two times a day (BID) | ORAL | 0 refills | Status: DC

## 2021-07-04 NOTE — Telephone Encounter (Signed)
The patient's Eliquis was called in to mail order and will not arrive in time to start for a month prior to [place]. Cancelled the order and called in Eliquis 2.5 mg BID to [facility]. The patient will pick up the prescription today and start. She was grateful for call and agrees with plan.

## 2021-07-09 NOTE — Progress Notes (Signed)
Remote pacemaker transmission.   

## 2021-07-15 ENCOUNTER — Other Ambulatory Visit: Payer: Self-pay

## 2021-07-15 ENCOUNTER — Ambulatory Visit (INDEPENDENT_AMBULATORY_CARE_PROVIDER_SITE_OTHER): Payer: Medicare HMO

## 2021-07-15 DIAGNOSIS — E538 Deficiency of other specified B group vitamins: Secondary | ICD-10-CM | POA: Diagnosis not present

## 2021-07-15 MED ORDER — CYANOCOBALAMIN 1000 MCG/ML IJ SOLN
1000.0000 ug | INTRAMUSCULAR | Status: AC
  Administered 2021-07-15 – 2021-11-21 (×5): 1000 ug via INTRAMUSCULAR

## 2021-07-15 NOTE — Progress Notes (Signed)
Cyanocobalamin injection given to left deltoid.  Patient tolerated well. 

## 2021-07-30 ENCOUNTER — Other Ambulatory Visit (HOSPITAL_COMMUNITY)
Admission: RE | Admit: 2021-07-30 | Discharge: 2021-07-30 | Disposition: A | Discharge status: Home / Self Care | Payer: Medicare HMO | Source: Ambulatory Visit | Attending: Cardiology | Admitting: Cardiology

## 2021-07-30 DIAGNOSIS — Z01818 Encounter for other preprocedural examination: Secondary | ICD-10-CM

## 2021-07-30 DIAGNOSIS — Z01812 Encounter for preprocedural laboratory examination: Secondary | ICD-10-CM | POA: Insufficient documentation

## 2021-07-30 DIAGNOSIS — Z20822 Contact with and (suspected) exposure to covid-19: Secondary | ICD-10-CM | POA: Insufficient documentation

## 2021-07-31 ENCOUNTER — Telehealth: Payer: Self-pay

## 2021-07-31 LAB — SARS CORONAVIRUS 2 (TAT 6-24 HRS): SARS Coronavirus 2: NEGATIVE

## 2021-07-31 NOTE — Telephone Encounter (Signed)
Confirmed negative Covid test with patient. Confirmed instructions again. She understands to arrive at [facility] at [place] in the morning. She understands to take medications as directed today and to hold all medications tomorrow. She was grateful for call and agrees with plan.

## 2021-08-01 ENCOUNTER — Inpatient Hospital Stay (HOSPITAL_COMMUNITY): Payer: Medicare HMO

## 2021-08-01 ENCOUNTER — Encounter (HOSPITAL_COMMUNITY): Payer: Self-pay | Admitting: Cardiology

## 2021-08-01 ENCOUNTER — Encounter (HOSPITAL_COMMUNITY)
Admission: RE | Disposition: A | Discharge status: Home / Self Care | Payer: Self-pay | Source: Home / Self Care | Attending: Cardiology

## 2021-08-01 ENCOUNTER — Inpatient Hospital Stay (HOSPITAL_COMMUNITY): Payer: Medicare HMO | Admitting: Certified Registered Nurse Anesthetist

## 2021-08-01 ENCOUNTER — Inpatient Hospital Stay (HOSPITAL_COMMUNITY)
Admission: RE | Admit: 2021-08-01 | Discharge: 2021-08-02 | DRG: 274 | Disposition: A | Discharge status: Home / Self Care | Payer: Medicare HMO | Attending: Cardiology | Admitting: Cardiology

## 2021-08-01 ENCOUNTER — Other Ambulatory Visit: Payer: Self-pay

## 2021-08-01 DIAGNOSIS — I252 Old myocardial infarction: Secondary | ICD-10-CM | POA: Diagnosis not present

## 2021-08-01 DIAGNOSIS — I4821 Permanent atrial fibrillation: Principal | ICD-10-CM | POA: Diagnosis present

## 2021-08-01 DIAGNOSIS — Z9049 Acquired absence of other specified parts of digestive tract: Secondary | ICD-10-CM

## 2021-08-01 DIAGNOSIS — M797 Fibromyalgia: Secondary | ICD-10-CM | POA: Diagnosis present

## 2021-08-01 DIAGNOSIS — Z95828 Presence of other vascular implants and grafts: Secondary | ICD-10-CM | POA: Diagnosis not present

## 2021-08-01 DIAGNOSIS — I083 Combined rheumatic disorders of mitral, aortic and tricuspid valves: Secondary | ICD-10-CM | POA: Diagnosis not present

## 2021-08-01 DIAGNOSIS — Z79899 Other long term (current) drug therapy: Secondary | ICD-10-CM

## 2021-08-01 DIAGNOSIS — I509 Heart failure, unspecified: Secondary | ICD-10-CM | POA: Diagnosis not present

## 2021-08-01 DIAGNOSIS — Z9884 Bariatric surgery status: Secondary | ICD-10-CM

## 2021-08-01 DIAGNOSIS — F32A Depression, unspecified: Secondary | ICD-10-CM | POA: Diagnosis present

## 2021-08-01 DIAGNOSIS — Z950 Presence of cardiac pacemaker: Secondary | ICD-10-CM

## 2021-08-01 DIAGNOSIS — K922 Gastrointestinal hemorrhage, unspecified: Secondary | ICD-10-CM

## 2021-08-01 DIAGNOSIS — M545 Low back pain, unspecified: Secondary | ICD-10-CM | POA: Diagnosis present

## 2021-08-01 DIAGNOSIS — Z7983 Long term (current) use of bisphosphonates: Secondary | ICD-10-CM | POA: Diagnosis not present

## 2021-08-01 DIAGNOSIS — Z7951 Long term (current) use of inhaled steroids: Secondary | ICD-10-CM

## 2021-08-01 DIAGNOSIS — M1990 Unspecified osteoarthritis, unspecified site: Secondary | ICD-10-CM | POA: Diagnosis present

## 2021-08-01 DIAGNOSIS — Z8249 Family history of ischemic heart disease and other diseases of the circulatory system: Secondary | ICD-10-CM

## 2021-08-01 DIAGNOSIS — E785 Hyperlipidemia, unspecified: Secondary | ICD-10-CM | POA: Diagnosis present

## 2021-08-01 DIAGNOSIS — Z885 Allergy status to narcotic agent status: Secondary | ICD-10-CM

## 2021-08-01 DIAGNOSIS — G629 Polyneuropathy, unspecified: Secondary | ICD-10-CM | POA: Diagnosis present

## 2021-08-01 DIAGNOSIS — K219 Gastro-esophageal reflux disease without esophagitis: Secondary | ICD-10-CM | POA: Diagnosis present

## 2021-08-01 DIAGNOSIS — I5032 Chronic diastolic (congestive) heart failure: Secondary | ICD-10-CM | POA: Diagnosis not present

## 2021-08-01 DIAGNOSIS — Z20822 Contact with and (suspected) exposure to covid-19: Secondary | ICD-10-CM | POA: Diagnosis present

## 2021-08-01 DIAGNOSIS — I495 Sick sinus syndrome: Secondary | ICD-10-CM | POA: Diagnosis present

## 2021-08-01 DIAGNOSIS — Z006 Encounter for examination for normal comparison and control in clinical research program: Secondary | ICD-10-CM

## 2021-08-01 DIAGNOSIS — Z01818 Encounter for other preprocedural examination: Secondary | ICD-10-CM | POA: Diagnosis not present

## 2021-08-01 DIAGNOSIS — J449 Chronic obstructive pulmonary disease, unspecified: Secondary | ICD-10-CM | POA: Diagnosis present

## 2021-08-01 DIAGNOSIS — I110 Hypertensive heart disease with heart failure: Secondary | ICD-10-CM | POA: Diagnosis not present

## 2021-08-01 DIAGNOSIS — I4819 Other persistent atrial fibrillation: Secondary | ICD-10-CM | POA: Diagnosis not present

## 2021-08-01 DIAGNOSIS — Z9851 Tubal ligation status: Secondary | ICD-10-CM | POA: Diagnosis not present

## 2021-08-01 DIAGNOSIS — K08109 Complete loss of teeth, unspecified cause, unspecified class: Secondary | ICD-10-CM | POA: Diagnosis present

## 2021-08-01 DIAGNOSIS — D509 Iron deficiency anemia, unspecified: Secondary | ICD-10-CM | POA: Diagnosis present

## 2021-08-01 DIAGNOSIS — Z86718 Personal history of other venous thrombosis and embolism: Secondary | ICD-10-CM

## 2021-08-01 DIAGNOSIS — Z9081 Acquired absence of spleen: Secondary | ICD-10-CM

## 2021-08-01 DIAGNOSIS — Z95818 Presence of other cardiac implants and grafts: Secondary | ICD-10-CM

## 2021-08-01 DIAGNOSIS — I4891 Unspecified atrial fibrillation: Secondary | ICD-10-CM | POA: Diagnosis present

## 2021-08-01 DIAGNOSIS — I10 Essential (primary) hypertension: Secondary | ICD-10-CM | POA: Diagnosis present

## 2021-08-01 DIAGNOSIS — Z90710 Acquired absence of both cervix and uterus: Secondary | ICD-10-CM

## 2021-08-01 DIAGNOSIS — G8929 Other chronic pain: Secondary | ICD-10-CM | POA: Diagnosis present

## 2021-08-01 HISTORY — DX: Presence of other cardiac implants and grafts: Z95.818

## 2021-08-01 HISTORY — PX: TEE WITHOUT CARDIOVERSION: SHX5443

## 2021-08-01 HISTORY — PX: LEFT ATRIAL APPENDAGE OCCLUSION: EP1229

## 2021-08-01 LAB — CBC
HCT: 36.3 % (ref 36.0–46.0)
Hemoglobin: 11.4 g/dL — ABNORMAL LOW (ref 12.0–15.0)
MCH: 28.6 pg (ref 26.0–34.0)
MCHC: 31.4 g/dL (ref 30.0–36.0)
MCV: 91 fL (ref 80.0–100.0)
Platelets: 312 10*3/uL (ref 150–400)
RBC: 3.99 MIL/uL (ref 3.87–5.11)
RDW: 14.4 % (ref 11.5–15.5)
WBC: 6.3 10*3/uL (ref 4.0–10.5)
nRBC: 0 % (ref 0.0–0.2)

## 2021-08-01 LAB — BASIC METABOLIC PANEL
Anion gap: 8 (ref 5–15)
BUN: 20 mg/dL (ref 8–23)
CO2: 30 mmol/L (ref 22–32)
Calcium: 8.6 mg/dL — ABNORMAL LOW (ref 8.9–10.3)
Chloride: 101 mmol/L (ref 98–111)
Creatinine, Ser: 0.88 mg/dL (ref 0.44–1.00)
GFR, Estimated: >60 mL/min (ref >60)
Glucose, Bld: 88 mg/dL (ref 70–99)
Potassium: 2.9 mmol/L — ABNORMAL LOW (ref 3.5–5.1)
Sodium: 139 mmol/L (ref 135–145)

## 2021-08-01 LAB — SURGICAL PCR SCREEN
MRSA, PCR: NEGATIVE
Staphylococcus aureus: NEGATIVE

## 2021-08-01 LAB — TYPE AND SCREEN
ABO/RH(D): A POS
Antibody Screen: NEGATIVE

## 2021-08-01 LAB — POCT ACTIVATED CLOTTING TIME: Activated Clotting Time: 312 seconds

## 2021-08-01 LAB — ABO/RH: ABO/RH(D): A POS

## 2021-08-01 SURGERY — LEFT ATRIAL APPENDAGE OCCLUSION
Anesthesia: General

## 2021-08-01 MED ORDER — ONDANSETRON HCL 4 MG/2ML IJ SOLN
INTRAMUSCULAR | Status: DC | PRN
  Administered 2021-08-01: 4 mg via INTRAVENOUS

## 2021-08-01 MED ORDER — HEPARIN (PORCINE) IN NACL 2000-0.9 UNIT/L-% IV SOLN
INTRAVENOUS | Status: AC
  Filled 2021-08-01: qty 1000

## 2021-08-01 MED ORDER — LACTATED RINGERS IV SOLN: INTRAVENOUS | Status: DC | PRN

## 2021-08-01 MED ORDER — ATORVASTATIN CALCIUM 10 MG PO TABS
20.0000 mg | ORAL_TABLET | Freq: Every day | ORAL | Status: DC
  Administered 2021-08-01 – 2021-08-02 (×2): 20 mg via ORAL
  Filled 2021-08-01 (×2): qty 2

## 2021-08-01 MED ORDER — LACTATED RINGERS IV SOLN: INTRAVENOUS | Status: DC

## 2021-08-01 MED ORDER — DULOXETINE HCL 60 MG PO CPEP
60.0000 mg | ORAL_CAPSULE | Freq: Every day | ORAL | Status: DC
  Administered 2021-08-01 – 2021-08-02 (×2): 60 mg via ORAL
  Filled 2021-08-01 (×2): qty 1

## 2021-08-01 MED ORDER — ONDANSETRON HCL 4 MG/2ML IJ SOLN: 4.0000 mg | Freq: Four times a day (QID) | INTRAMUSCULAR | Status: DC | PRN

## 2021-08-01 MED ORDER — APIXABAN 2.5 MG PO TABS
2.5000 mg | ORAL_TABLET | Freq: Two times a day (BID) | ORAL | Status: DC
  Administered 2021-08-01 – 2021-08-02 (×3): 2.5 mg via ORAL
  Filled 2021-08-01 (×3): qty 1

## 2021-08-01 MED ORDER — SODIUM CHLORIDE 0.9% FLUSH
3.0000 mL | Freq: Two times a day (BID) | INTRAVENOUS | Status: DC
  Administered 2021-08-01 – 2021-08-02 (×3): 3 mL via INTRAVENOUS

## 2021-08-01 MED ORDER — PROPOFOL 10 MG/ML IV BOLUS
INTRAVENOUS | Status: DC | PRN
  Administered 2021-08-01: 100 mg via INTRAVENOUS

## 2021-08-01 MED ORDER — PANTOPRAZOLE SODIUM 40 MG PO TBEC
40.0000 mg | DELAYED_RELEASE_TABLET | Freq: Two times a day (BID) | ORAL | Status: DC
  Administered 2021-08-01 – 2021-08-02 (×3): 40 mg via ORAL
  Filled 2021-08-01 (×3): qty 1

## 2021-08-01 MED ORDER — PROTAMINE SULFATE 10 MG/ML IV SOLN
INTRAVENOUS | Status: DC | PRN
  Administered 2021-08-01: 5 mg via INTRAVENOUS
  Administered 2021-08-01: 15 mg via INTRAVENOUS
  Administered 2021-08-01: 10 mg via INTRAVENOUS

## 2021-08-01 MED ORDER — IOHEXOL 350 MG/ML SOLN
INTRAVENOUS | Status: DC | PRN
  Administered 2021-08-01: 20 mL

## 2021-08-01 MED ORDER — MOMETASONE FURO-FORMOTEROL FUM 100-5 MCG/ACT IN AERO
2.0000 | INHALATION_SPRAY | Freq: Two times a day (BID) | RESPIRATORY_TRACT | Status: DC
  Administered 2021-08-01 – 2021-08-02 (×2): 2 via RESPIRATORY_TRACT
  Filled 2021-08-01: qty 8.8

## 2021-08-01 MED ORDER — HEPARIN SODIUM (PORCINE) 1000 UNIT/ML IJ SOLN
INTRAMUSCULAR | Status: DC | PRN
  Administered 2021-08-01: 11000 [IU] via INTRAVENOUS

## 2021-08-01 MED ORDER — GABAPENTIN 100 MG PO CAPS
100.0000 mg | ORAL_CAPSULE | Freq: Two times a day (BID) | ORAL | Status: DC
  Administered 2021-08-01 – 2021-08-02 (×3): 100 mg via ORAL
  Filled 2021-08-01 (×3): qty 1

## 2021-08-01 MED ORDER — PHENYLEPHRINE HCL-NACL 20-0.9 MG/250ML-% IV SOLN
INTRAVENOUS | Status: DC | PRN
  Administered 2021-08-01: 30 ug/min via INTRAVENOUS

## 2021-08-01 MED ORDER — ROCURONIUM BROMIDE 10 MG/ML (PF) SYRINGE
PREFILLED_SYRINGE | INTRAVENOUS | Status: DC | PRN
  Administered 2021-08-01: 70 mg via INTRAVENOUS

## 2021-08-01 MED ORDER — DEXAMETHASONE SODIUM PHOSPHATE 10 MG/ML IJ SOLN
INTRAMUSCULAR | Status: DC | PRN
  Administered 2021-08-01: 5 mg via INTRAVENOUS

## 2021-08-01 MED ORDER — CEFAZOLIN SODIUM-DEXTROSE 2-4 GM/100ML-% IV SOLN
INTRAVENOUS | Status: AC
  Filled 2021-08-01: qty 100

## 2021-08-01 MED ORDER — ACETAMINOPHEN 325 MG PO TABS: 650.0000 mg | ORAL_TABLET | ORAL | Status: DC | PRN

## 2021-08-01 MED ORDER — SODIUM CHLORIDE 0.9 % IV SOLN: 250.0000 mL | INTRAVENOUS | Status: DC | PRN

## 2021-08-01 MED ORDER — ASPIRIN EC 81 MG PO TBEC
81.0000 mg | DELAYED_RELEASE_TABLET | Freq: Every day | ORAL | Status: DC
  Administered 2021-08-01 – 2021-08-02 (×2): 81 mg via ORAL
  Filled 2021-08-01 (×2): qty 1

## 2021-08-01 MED ORDER — SODIUM CHLORIDE 0.9 % IV SOLN: INTRAVENOUS | Status: DC

## 2021-08-01 MED ORDER — SUGAMMADEX SODIUM 200 MG/2ML IV SOLN
INTRAVENOUS | Status: DC | PRN
  Administered 2021-08-01: 200 mg via INTRAVENOUS

## 2021-08-01 MED ORDER — LIDOCAINE 2% (20 MG/ML) 5 ML SYRINGE
INTRAMUSCULAR | Status: DC | PRN
  Administered 2021-08-01: 60 mg via INTRAVENOUS

## 2021-08-01 MED ORDER — SODIUM CHLORIDE 0.9% FLUSH: 3.0000 mL | INTRAVENOUS | Status: DC | PRN

## 2021-08-01 MED ORDER — HEPARIN (PORCINE) IN NACL 2000-0.9 UNIT/L-% IV SOLN
INTRAVENOUS | Status: DC | PRN
  Administered 2021-08-01: 1000 mL

## 2021-08-01 MED ORDER — CEFAZOLIN SODIUM-DEXTROSE 2-4 GM/100ML-% IV SOLN
2.0000 g | INTRAVENOUS | Status: AC
  Administered 2021-08-01: 2 g via INTRAVENOUS

## 2021-08-01 MED ORDER — HEPARIN (PORCINE) IN NACL 1000-0.9 UT/500ML-% IV SOLN
INTRAVENOUS | Status: DC | PRN
  Administered 2021-08-01: 500 mL

## 2021-08-01 MED ORDER — CHLORHEXIDINE GLUCONATE 0.12 % MT SOLN
OROMUCOSAL | Status: AC
  Administered 2021-08-01: 15 mL
  Filled 2021-08-01: qty 15

## 2021-08-01 MED ORDER — HEPARIN (PORCINE) IN NACL 1000-0.9 UT/500ML-% IV SOLN
INTRAVENOUS | Status: AC
  Filled 2021-08-01: qty 500

## 2021-08-01 SURGICAL SUPPLY — 20 items
CATH INFINITI 5FR ANG PIGTAIL (CATHETERS) ×2 IMPLANT
CLOSURE PERCLOSE PROSTYLE (VASCULAR PRODUCTS) ×4 IMPLANT
DILATOR VESSEL 38 20CM 12FR (INTRODUCER) ×2 IMPLANT
DILATOR VESSEL 38 20CM 16FR (INTRODUCER) ×2 IMPLANT
KIT HEART LEFT (KITS) ×2 IMPLANT
KIT SHEA VERSACROSS LAAC CONNE (KITS) ×2 IMPLANT
MAT PREVALON FULL STRYKER (MISCELLANEOUS) ×2 IMPLANT
PACK CARDIAC CATHETERIZATION (CUSTOM PROCEDURE TRAY) ×2 IMPLANT
PAD PRO RADIOLUCENT 2001M-C (PAD) ×2 IMPLANT
SHEATH PERFORMER 16FR 30 (SHEATH) ×2 IMPLANT
SHEATH PINNACLE 8F 10CM (SHEATH) ×2 IMPLANT
SHEATH PROBE COVER 6X72 (BAG) ×2 IMPLANT
SHIELD RADPAD SCOOP 12X17 (MISCELLANEOUS) ×2 IMPLANT
SYS WATCHMAN FXD DBL (SHEATH) ×2
SYSTEM WATCHMAN FXD DBL (SHEATH) ×1 IMPLANT
TRANSDUCER W/STOPCOCK (MISCELLANEOUS) ×2 IMPLANT
TUBING CIL FLEX 10 FLL-RA (TUBING) ×2 IMPLANT
WATCHMAN FLX 24 (Prosthesis & Implant Heart) ×2 IMPLANT
WATCHMAN FLX PROCEDURE DEVICE (KITS) ×2 IMPLANT
WATCHMAN PROCED TRUSEAL ACCESS (SHEATH) ×2 IMPLANT

## 2021-08-01 NOTE — Progress Notes (Signed)
Echocardiogram Echocardiogram Transesophageal has been performed.  Oneal Deputy Kaylia Winborne RDCS 08/01/2021, 8:59 AM

## 2021-08-01 NOTE — H&P (Signed)
Electrophysiology Office Note:     Date:  05/07/2021    ID:  Sabrina, [name] 22-Nov-1945, MRN [id number]   PCP:  Sharion Balloon, [place] Cardiologist:  None  [facility] Electrophysiologist:  [name], MD    Referring MD: Evans Lance, MD    Chief Complaint: AF   History of Present Illness:     Sabrina [name] is a 75 y.o. female who presents for an evaluation of AF at the request of Dr Lovena Le. Their medical history includes COPD, GERD, HTN, DVT, GI bleed, DVT with an IVC filter placed in [facility]. She also has sinus node dysfunction with a PPM implanted by Dr Lovena Le.   The patient is currently off anticoagulation due to history of significant GI bleed episode. This occurred after she was started on [facility] after the DVT diagnosis. Because of the bleeding, her anticoagulant was stopped and a IVC filter was placed.  This was to be removed but she would like this to be done in [place].   Given her concern about having a stroke off anticoagulation, the patient presents to discuss watchman.              Past Medical History:  Diagnosis Date   Anemia     Ankle fracture, right      past hx. -"no surgery"   Anxiety     Asthma     CHF (congestive heart failure) ([place]) 2009   Chronic lower back pain     Collagen vascular disease (HCC)     COPD (chronic obstructive pulmonary disease) (HCC)     Depression     Fibromyalgia     GERD (gastroesophageal reflux disease)     Hyperlipidemia     Hypertension     Immature cataract of both eyes     Myocardial infarction ([place])      "I've had a light one; don't know when it happened" (08/27/2017)   Osteoarthritis     Peripheral neuropathy      legs and feet   Persistent atrial fibrillation (HCC)     Tubular adenoma of colon             Past Surgical History:  Procedure Laterality Date   APPENDECTOMY       AV NODE ABLATION N/A 08/27/2017    Procedure: AV NODE ABLATION;  Surgeon: Evans Lance,  MD;  Location: [place] [facility];  Service: Cardiovascular;  Laterality: N/A;   CHOLECYSTECTOMY OPEN   1978   DILATION AND CURETTAGE OF UTERUS       FEMUR FRACTURE SURGERY Left 2013    "put 7" rod in it"   FRACTURE SURGERY       LAPAROSCOPY   08/22/2016    Procedure: LAPAROSCOPY DIAGNOSTIC;  Surgeon: [name], MD;  Location: [facility];  Service: General;;   MEDIAL PARTIAL KNEE REPLACEMENT Right 2005    "@ [facility]"   PACEMAKER IMPLANT N/A 08/27/2017    Procedure: PACEMAKER IMPLANT;  Surgeon: Evans Lance, MD;  Location: [place] [facility];  Service: Cardiovascular;  Laterality: N/A;   ROUX-EN-Y GASTRIC BYPASS   2002    [place]   2002   TONSILLECTOMY   1944   TUBAL LIGATION       VAGINAL HYSTERECTOMY          Current Medications: Active Medications      Current Meds  Medication Sig   albuterol (VENTOLIN HFA) 108 (90 Base) MCG/ACT inhaler TAKE 2 PUFFS BY MOUTH EVERY 6 HOURS AS NEEDED FOR WHEEZE OR SHORTNESS OF BREATH   alendronate (FOSAMAX) 70 MG tablet Take 1 tablet (70 mg total) by mouth every 7 (seven) days. Take with a full glass of water on an empty stomach.   ALPRAZolam (XANAX) 0.5 MG tablet Take 0.5 mg by mouth at bedtime as needed for anxiety.   atorvastatin (LIPITOR) 20 MG tablet Take 1 tablet (20 mg total) by mouth daily.   calcium carbonate (OSCAL) 1500 (600 Ca) MG TABS tablet Take by mouth daily with breakfast.   DULoxetine (CYMBALTA) 60 MG capsule TAKE 1 CAPSULE EVERY DAY   ferrous sulfate 325 (65 FE) MG tablet Take 325 mg by mouth daily.   fluticasone (FLONASE) 50 MCG/ACT nasal spray SPRAY 2 SPRAYS INTO EACH NOSTRIL EVERY DAY   furosemide (LASIX) 20 MG tablet Take 0.5 tablets (10 mg total) by mouth daily. (MAY TAKE EXTRA 1/2 TAB - 10MG  AS NEEDED FOR SWELLING OR 3LB WEIGHT GAIN)   gabapentin (NEURONTIN) 100 MG capsule Take 1 capsule (100 mg total) by mouth 2 (two) times daily. Needs to be seen   Multiple Vitamins-Minerals (ONE-A-DAY 50  PLUS PO) Take 1 tablet by mouth daily.   Omega-3 Fatty Acids (FISH OIL PO) Take 1 capsule by mouth daily.   pantoprazole (PROTONIX) 40 MG tablet Take 1 tablet (40 mg total) by mouth 2 (two) times daily.   potassium chloride SA (KLOR-CON) 20 MEQ tablet Take 1 tablet (20 mEq total) by mouth daily.   PRESCRIPTION MEDICATION Apply 1 application topically daily as needed (excema). HAL Ointment   SYMBICORT 80-4.5 MCG/ACT inhaler Inhale 2 puffs into the lungs 2 (two) times daily.   triamcinolone ointment (KENALOG) 0.5 % Apply 1 application topically 2 (two) times daily.   Vitamin D, Ergocalciferol, (DRISDOL) 1.25 MG (50000 UNIT) CAPS capsule Take 1 capsule (50,000 Units total) by mouth every 7 (seven) days.        Allergies:   Codeine    Social History         Socioeconomic History   Marital status: Divorced      Spouse name: Not on file   Number of children: 3   Years of education: 2 years of college   Highest education level: Some college, no degree  Occupational History   Occupation: Retired  Tobacco Use   Smoking status: Former      Packs/day: 0.50      Years: 25.00      Pack years: 12.50      Types: Cigarettes      Quit date: 02/26/1993      Years since quitting: 28.2   Smokeless tobacco: Never  Vaping Use   Vaping Use: Never used  Substance and Sexual Activity   Alcohol use: Not Currently      Comment: 08/27/2017 "nothing since early 2000s"   Drug use: Not Currently   Sexual activity: Not Currently      Birth control/protection: Surgical  Other Topics Concern   Not on file  Social History Narrative    ** Merged History Encounter **         Pt is right handed Lives in single story home with her grandson Has 3 adult children Associated degree  Retired Quarry manager    Social Determinants of Scientist, research (life sciences) Strain: Low Risk    Difficulty of Paying Living Expenses: Not  very hard  Food Insecurity: No Food Insecurity   Worried About Charity fundraiser in the  Last Year: Never true   Ran Out of Food in the Last Year: Never true  Transportation Needs: No Transportation Needs   Lack of Transportation (Medical): No   Lack of Transportation (Non-Medical): No  Physical Activity: Sufficiently Active   Days of Exercise per Week: 3 days   Minutes of Exercise per Session: 60 min  Stress: Stress Concern Present   Feeling of Stress : To some extent  Social Connections: Moderately Integrated   Frequency of Communication with Friends and Family: More than three times a week   Frequency of Social Gatherings with Friends and Family: Twice a week   Attends Religious Services: 1 to 4 times per year   Active Member of [employer] or Organizations: Yes   Attends Archivist Meetings: 1 to 4 times per year   Marital Status: Divorced      Family History: The patient's family history includes Alzheimer's disease in her mother; Arthritis in her daughter, father, son, and son; Asthma in her daughter; Cancer in her mother; Colon cancer (age of onset: 35) in her paternal uncle; Heart disease in her mother; Hyperlipidemia in her son; Obesity in her daughter, son, and son.   ROS:   Please see the history of present illness.    All other systems reviewed and are negative.   EKGs/Labs/Other Studies Reviewed:     The following studies were reviewed today:       EKG:  The ekg ordered today demonstrates atrial fibrillation with ventricular pacing.   Recent Labs: 01/10/2021: ALT 6; BUN 11; Creatinine, Ser 0.82; Potassium 4.6; Sodium 144 03/19/2021: Hemoglobin 11.8; Platelets 350  Recent Lipid Panel Labs (Brief)          Component Value Date/Time    CHOL 199 09/30/2019 1018    TRIG 137 09/30/2019 1018    HDL 60 09/30/2019 1018    CHOLHDL 3.3 09/30/2019 1018    LDLCALC 115 (H) 09/30/2019 1018        Physical Exam:     VS:  BP 94/62   Pulse 80   Ht 5\' 4"  (1.626 m)   Wt 158 lb 3.2 oz (71.8 kg)   SpO2 96%   BMI 27.15 kg/m         Wt Readings from  Last 3 Encounters:  05/07/21 158 lb 3.2 oz (71.8 kg)  05/07/21 157 lb 3.2 oz (71.3 kg)  05/06/21 157 lb (71.2 kg)      GEN:  Well nourished, well developed in no acute distress HEENT: Normal NECK: No JVD; No carotid bruits LYMPHATICS: No lymphadenopathy CARDIAC: RRR, no murmurs, rubs, gallops RESPIRATORY:  Clear to auscultation without rales, wheezing or rhonchi  ABDOMEN: Soft, non-tender, non-distended MUSCULOSKELETAL:  No edema; No deformity  SKIN: Warm and dry NEUROLOGIC:  Alert and oriented x 3 PSYCHIATRIC:  Normal affect    ASSESSMENT:     1. Permanent atrial fibrillation (HCC)   2. Cardiac pacemaker in situ   3. Gastrointestinal hemorrhage, unspecified gastrointestinal hemorrhage type   4. Presence of IVC filter     PLAN:     In order of problems listed above:   1. Permanent atrial fibrillation (HCC) Off anticoagulation due to history of significant GI bleeding. She is interested in the watchman device as part of a strategy to avoid long term exposure to anticoagulants. I think this is a very reasonable strategy.  I have discussed the watchman procedure in detail with the patient during today's clinic appointment including the risks, recovery and need for short term anticoagulation. Given her history of significant GI bleeding, will use apixaban 2.5mg  PO BID for 4 weeks prior to the implant. We discussed this regimen in detail during today's clinic appointment.    -------------------------------------   I have seen Jenell Milliner in the office today who is being considered for a Watchman left atrial appendage closure device. I believe they will benefit from this procedure given their history of atrial fibrillation, CHA2DS2-VASc score of 4 and unadjusted ischemic stroke rate of 6.7% per year. Unfortunately, the patient is not felt to be a long term anticoagulation candidate secondary to history of GI bleeding. The patient's chart has been reviewed and I feel that they  would be a candidate for short term oral anticoagulation after Watchman implant.    It is my belief that after undergoing a LAA closure procedure, Jenell Milliner will not need long term anticoagulation which eliminates anticoagulation side effects and major bleeding risk.    Procedural risks for the Watchman implant have been reviewed with the patient including a 0.5% risk of stroke, <1% risk of perforation and <1% risk of device embolization.      The published clinical data on the safety and effectiveness of WATCHMAN include but are not limited to the following: - Holmes DR, Mechele Claude, Sick P et al. for the PROTECT AF Investigators. Percutaneous closure of the left atrial appendage versus warfarin therapy for prevention of stroke in patients with atrial fibrillation: a randomised non-inferiority trial. Lancet 2009; 374: 534-42. Mechele Claude, Doshi SK, Abelardo Diesel D et al. on behalf of the PROTECT AF Investigators. Percutaneous Left Atrial Appendage Closure for Stroke Prophylaxis in Patients With Atrial Fibrillation 2.3-Year Follow-up of the PROTECT AF (Watchman Left Atrial Appendage System for Embolic Protection in Patients With Atrial Fibrillation) Trial. Circulation 2013; 127:720-729. - Alli O, Doshi S,  Kar S, Reddy VY, Sievert H et al. Quality of Life Assessment in the Randomized PROTECT AF (Percutaneous Closure of the Left Atrial Appendage Versus Warfarin Therapy for Prevention of Stroke in Patients With Atrial Fibrillation) Trial of Patients at Risk for Stroke With Nonvalvular Atrial Fibrillation. J Am Coll Cardiol 2013; 27:5170-0. Vertell Limber DR, Tarri Abernethy, Price M, Savage, Sievert H, Doshi S, Huber K, Reddy V. Prospective randomized evaluation of the Watchman left atrial appendage Device in patients with atrial fibrillation versus long-term warfarin therapy; the PREVAIL trial. Journal of the [employer] of Cardiology, Vol. 4, No. 1, 2014, 1-11. - Kar S, Doshi SK, Sadhu A, Horton R,  Osorio J et al. Primary outcome evaluation of a next-generation left atrial appendage closure device: results from the PINNACLE FLX trial. Circulation 2021;143(18)1754-1762.      After today's visit with the patient which was dedicated solely for shared decision making visit regarding LAA closure device, the patient decided to proceed with the LAA appendage closure procedure scheduled to be done in the near future at [facility]. Prior to the procedure, I would like to obtain a gated CT scan of the chest with contrast timed for PV/LA visualization.    Additionally, the patient will need the IVC filter removed (referral placed to Dr Trula Slade).     2. Cardiac pacemaker in situ Functioning appropriately.   3. Gastrointestinal hemorrhage, unspecified gastrointestinal hemorrhage type Off anticoagulants for now. Watchman as above.       -------------------------------------------------- I  have seen, examined the patient, and reviewed the above assessment and plan.    Now post IVC filter removal. Plan for Watchman implant today. Procedure reviewed.   Vickie Epley, MD 08/01/2021 7:20 AM

## 2021-08-01 NOTE — Progress Notes (Signed)
  [facility]    Patient doing well s/p Watchman implant. She is hemodynamically stable. Groin site is stable. Plan for early ambulation after bedrest completed and hopeful discharge over the next 24-48 hours.   Kathyrn Drown NP-C Structural Heart Team  Pager: [phone]

## 2021-08-01 NOTE — Plan of Care (Signed)
  Problem: Cardiovascular: Goal: Ability to achieve and maintain adequate cardiovascular perfusion will improve Outcome: Progressing Goal: Vascular access site(s) Level 0-1 will be maintained Outcome: Progressing   Problem: Health Behavior/Discharge Planning: Goal: Ability to safely manage health-related needs after discharge will improve Outcome: Progressing   Problem: Education: Goal: Knowledge of General Education information will improve Description: Including pain rating scale, medication(s)/side effects and non-pharmacologic comfort measures Outcome: Progressing   Problem: Health Behavior/Discharge Planning: Goal: Ability to manage health-related needs will improve Outcome: Progressing   Problem: Clinical Measurements: Goal: Ability to maintain clinical measurements within normal limits will improve Outcome: Progressing Goal: Will remain free from infection Outcome: Progressing Goal: Diagnostic test results will improve Outcome: Progressing Goal: Respiratory complications will improve Outcome: Progressing Goal: Cardiovascular complication will be avoided Outcome: Progressing   Problem: Activity: Goal: Risk for activity intolerance will decrease Outcome: Progressing   Problem: Nutrition: Goal: Adequate nutrition will be maintained Outcome: Progressing   Problem: Coping: Goal: Level of anxiety will decrease Outcome: Progressing   Problem: Elimination: Goal: Will not experience complications related to bowel motility Outcome: Progressing Goal: Will not experience complications related to urinary retention Outcome: Progressing   Problem: Pain Managment: Goal: General experience of comfort will improve Outcome: Progressing   Problem: Safety: Goal: Ability to remain free from injury will improve Outcome: Progressing   Problem: Skin Integrity: Goal: Risk for impaired skin integrity will decrease Outcome: Progressing

## 2021-08-01 NOTE — Anesthesia Preprocedure Evaluation (Signed)
Anesthesia Evaluation  Patient identified by MRN, date of birth, ID band Patient awake    Reviewed: Allergy & Precautions, NPO status , Patient's Chart, lab work & pertinent test results  History of Anesthesia Complications Negative for: history of anesthetic complications  Airway Mallampati: II  TM Distance: >3 FB Neck ROM: Full    Dental  (+) Edentulous Upper, Edentulous Lower   Pulmonary neg shortness of breath, neg sleep apnea, COPD,  COPD inhaler, neg recent URI, former smoker,    breath sounds clear to auscultation       Cardiovascular hypertension, (-) angina+CHF  + dysrhythmias Atrial Fibrillation + pacemaker  Rhythm:Irregular  1. Left ventricular ejection fraction, by estimation, is 60 to 65%. The  left ventricle has normal function. The left ventricle has no regional  wall motion abnormalities. Left ventricular diastolic parameters are  indeterminate.  2. Right ventricular systolic function is normal. The right ventricular  size is normal.  3. Left atrial size was mildly dilated.  4. Right atrial size was mildly dilated.  5. The mitral valve is abnormal. Mild mitral valve regurgitation. No  evidence of mitral stenosis.  6. The aortic valve is tricuspid. There is mild calcification of the  aortic valve. There is mild thickening of the aortic valve. Aortic valve  regurgitation is mild. No aortic stenosis is present.  7. The inferior vena cava is normal in size with greater than 50%  respiratory variability, suggesting right atrial pressure of 3 mmHg.    Neuro/Psych PSYCHIATRIC DISORDERS Anxiety Depression  Neuromuscular disease    GI/Hepatic Neg liver ROS, GERD  Medicated and Controlled,  Endo/Other  negative endocrine ROS  Renal/GU Lab Results      Component                Value               Date                      CREATININE               0.99                07/03/2021                 Musculoskeletal  (+) Arthritis , Fibromyalgia -  Abdominal   Peds  Hematology Lab Results      Component                Value               Date                      WBC                      7.9                 07/03/2021                HGB                      13.0                07/03/2021                HCT                      40.0  07/03/2021                MCV                      85                  07/03/2021                PLT                      387                 07/03/2021            eliquis   Anesthesia Other Findings   Reproductive/Obstetrics                             Anesthesia Physical Anesthesia Plan  ASA: 3  Anesthesia Plan: General   Post-op Pain Management:    Induction: Intravenous  PONV Risk Score and Plan: 3 and Ondansetron and Dexamethasone  Airway Management Planned: Oral ETT  Additional Equipment:   Intra-op Plan:   Post-operative Plan: Extubation in OR  Informed Consent: I have reviewed the patients History and Physical, chart, labs and discussed the procedure including the risks, benefits and alternatives for the proposed anesthesia with the patient or authorized representative who has indicated his/her understanding and acceptance.     Dental advisory given  Plan Discussed with: Anesthesiologist and CRNA  Anesthesia Plan Comments: (clearsight)        Anesthesia Quick Evaluation

## 2021-08-01 NOTE — Progress Notes (Signed)
Mobility Specialist Progress Note    08/01/21 1513  Mobility  Activity Transferred to/from [facility]  Level of Assistance Standby assist, set-up cues, supervision of patient - no hands on  Assistive Device Front wheel walker  Distance Ambulated (ft) 2 ft  Mobility Out of bed for toileting  Mobility Response Tolerated well  Mobility performed by Mobility specialist  Bed Position Chair  $Mobility charge 1 Mobility   [facility] Mobility Specialist  Mobility Specialist Phone: [phone]

## 2021-08-01 NOTE — Progress Notes (Signed)
Pt states grandson has been having flu-like symptoms the past few days. He lives with her but in a separate area of the house. She states she has stayed away from him since he's been sick. Pt not experiencing any symptoms. Dr. Daiva Huge, anesthesia aware and OK to proceed at this time.

## 2021-08-01 NOTE — Progress Notes (Signed)
Pt states she parked in the valet area this morning, but valet was not present yet. Pt states she will be staying overnight and driving herself home tomorrow. Pt's keys were taken to Valet attendant by this nurse so they are aware of the vehicle. Slip for Valet put in top zipper pocket of pt's gray bag. Note left on bag to inform pt.

## 2021-08-01 NOTE — Discharge Summary (Signed)
[facility]    Patient ID: Sabrina [name],  MRN: [id number], DOB/AGE: 74-Feb-1947 74 y.o.  Admit date: 08/01/2021 Discharge date: 08/02/2021  Primary Care Physician: Sharion Balloon, FNP  Electrophysiologist: [name], MD/ Dr. Quentin Ore, MD [place])  Primary Discharge Diagnosis:  Permanent Atrial Fibrillation Poor candidacy for long term anticoagulation due to h/o GI bleeding  Secondary Discharge Diagnosis:  COPD, GERD, HTN, DVT, DVT with IVC filter>>now removed, sinus node dysfunction s/p PPM   Procedures This Admission:  Transeptal Puncture Intra-procedural TEE which showed no LAA thrombus Left atrial appendage occlusive device placement on 08/01/21 by Dr. Quentin Ore.   Device Implant: 24 mm Watchman FLXDevice LOT # [id number]  Conclusion: Successful implantation of a 24 mm Watchman left atrial appendage occlusion device using fluoroscopic and transesophageal echo guidance  Post Implant Anticoagulation Strategy: Aspirin 81mg  PO daily and Apixaban 2.5mg  PO BID x 45 days. If follow up TEE demonstrates well-seated device, transition to Aspirin 81mg  PO daily and Plavix 75mg  PO daily to complete 6 months total therapy post implant. After 6 months, transition to Aspirin 81mg  PO daily.  Brief HPI: Sabrina [name] is a 75 y.o. female with a history of GI bleed while on anticoagulation which was initially started after the diagnosis of DVT. Due to her bleeding, AC was discontinued and an IVC filter was placed. The plan was to have this removed. This was performed 06/20/21 by Dr. Carlis Abbott with [facility]. Due to concern of stroke off anticoagulation, she was referred to Dr. Quentin Ore to discuss Watchman implantation.  The risks, benefits, and alternatives to left atrial appendage occlusive device placement device were reviewed with the patient and the patient wished to proceed. The patient was underwent pre procedure CT imaging on 05/15/21 which showed a windsock LAA with  landing zone 21.3 by 29 mm suitable for a 27 mm FLX Watchman device. Given this, the patient was felt to have adequate anatomy for LAAO closure device which was scheduled on 08/01/21.    Hospital Course:  The patient was admitted and underwent left atrial appendage occlusive device placement with 24 mm Watchman FLX Device by Dr. Quentin Ore.  She was monitored on telemetry overnight which demonstrated AF with V-pacing. Groin site was without complication on the day of discharge. The patient was examined and considered to be stable for discharge. Wound care and restrictions were reviewed with the patient. The patient has been scheduled for post procedure follow up with Sabrina Drown, NP in approximately 1 month. Medication plan will be Aspirin 81mg  PO daily and Elquis 2.5mg  PO BID x 45 days. If follow up TEE demonstrates well-seated device, transition to Aspirin 81mg  PO daily and Plavix 75mg  PO daily to complete 6 months total therapy post implant. After 6 months, transition to Aspirin 81mg  PO daily. SBE prophylaxis for 6 months will be further discussed at follow up. She will need this for 6 months post implant. Will give Rx for Amoxicillin 2g 1 hour prior to dental procedures or cleanings.   Physical Exam: Vitals:   08/01/21 2324 08/02/21 0340 08/02/21 0357 08/02/21 0722  BP: 95/64 (!) 88/61 100/70 101/67  Pulse: 74 70 73 70  Resp: 18 15 16 17   Temp: 98 F (36.7 C) 98.1 F (36.7 C)  98.2 F (36.8 C)  TempSrc: Oral Oral  Oral  SpO2: 96% 97% 97% 96%  Weight:      Height:       General: Well developed, well nourished, NAD Lungs:Clear to ausculation  bilaterally. No wheezes, rales, or rhonchi. Breathing is unlabored. Cardiovascular: Irregularly irregular. No murmurs Extremities: No edema. Neuro: Alert and oriented. No focal deficits. No facial asymmetry. MAE spontaneously. Psych: Responds to questions appropriately with normal affect.    Labs:   Lab Results  Component Value Date   WBC 6.3  08/01/2021   HGB 11.4 (L) 08/01/2021   HCT 36.3 08/01/2021   MCV 91.0 08/01/2021   PLT 312 08/01/2021    Recent Labs  Lab 08/02/21 0118  NA 139  K 3.5  CL 101  CO2 31  BUN 20  CREATININE 1.06*  CALCIUM 8.9  GLUCOSE 117*   Discharge Medications:  Allergies as of 08/02/2021       Reactions   Codeine Nausea And Vomiting   Stomach pain also        Medication List     TAKE these medications    albuterol 108 (90 Base) MCG/ACT inhaler Commonly known as: Ventolin HFA TAKE 2 PUFFS BY MOUTH EVERY 6 HOURS AS NEEDED FOR WHEEZE OR SHORTNESS OF BREATH What changed:  how much to take how to take this when to take this additional instructions   amoxicillin 500 MG capsule Commonly known as: AMOXIL Take 4 capsules (2,000 mg total) by mouth as directed. Take 4 tablets 1 hour prior to dental work, including cleanings.   apixaban 2.5 MG Tabs tablet Commonly known as: ELIQUIS Take 1 tablet (2.5 mg total) by mouth 2 (two) times daily. What changed: how much to take   aspirin 81 MG EC tablet Take 1 tablet (81 mg total) by mouth daily. Swallow whole.   atorvastatin 20 MG tablet Commonly known as: LIPITOR Take 1 tablet (20 mg total) by mouth daily. What changed: when to take this   cromolyn 5.2 MG/ACT nasal spray Commonly known as: NASALCROM Place 1 spray into both nostrils in the morning and at bedtime.   DULoxetine 60 MG capsule Commonly known as: CYMBALTA TAKE 1 CAPSULE EVERY DAY What changed:  when to take this additional instructions   ferrous sulfate 325 (65 FE) MG tablet Take 325 mg by mouth 3 (three) times a week.   FISH OIL PO Take 1 capsule by mouth daily.   furosemide 40 MG tablet Commonly known as: LASIX Take 1-1.5 tablets (40-60 mg total) by mouth See admin instructions. Take 1.5 tablets (60 mg) by mouth once daily in the morning & may take an additional tablet (40 mg) in the evening if needed for fluid retention. What changed:  how much to  take when to take this additional instructions   gabapentin 100 MG capsule Commonly known as: NEURONTIN Take 1 capsule (100 mg total) by mouth 2 (two) times daily. Needs to be seen   multivitamin with minerals Tabs tablet Take 1 tablet by mouth in the morning.   PA Vitamin D-3 50 MCG (2000 UT) Caps Generic drug: Cholecalciferol Take 2,000 Units by mouth in the morning.   pantoprazole 40 MG tablet Commonly known as: PROTONIX Take 1 tablet (40 mg total) by mouth 2 (two) times daily.   potassium chloride SA 20 MEQ tablet Commonly known as: KLOR-CON Take 1 tablet (20 mEq total) by mouth daily. What changed: when to take this   Symbicort 80-4.5 MCG/ACT inhaler Generic drug: budesonide-formoterol Inhale 2 puffs into the lungs in the morning.   triamcinolone ointment 0.5 % Commonly known as: KENALOG Apply 1 application topically 2 (two) times daily. What changed:  when to take this reasons to take this  Vitamin B-12 5000 MCG Subl Place 5,000 mcg under the tongue in the morning.   cyanocobalamin 1000 MCG/ML injection Commonly known as: (VITAMIN B-12) Inject 1,000 mcg into the muscle every 30 (thirty) days.   vitamin C 500 MG tablet Commonly known as: ASCORBIC ACID Take 500 mg by mouth daily.   Vitamin D (Ergocalciferol) 1.25 MG (50000 UNIT) Caps capsule Commonly known as: DRISDOL Take 1 capsule (50,000 Units total) by mouth every 7 (seven) days. What changed: when to take this        Disposition:  Home  Discharge Instructions     Call MD for:  difficulty breathing, headache or visual disturbances   Complete by: As directed    Call MD for:  extreme fatigue   Complete by: As directed    Call MD for:  hives   Complete by: As directed    Call MD for:  persistant dizziness or light-headedness   Complete by: As directed    Call MD for:  persistant nausea and vomiting   Complete by: As directed    Call MD for:  redness, tenderness, or signs of infection (pain,  swelling, redness, odor or green/yellow discharge around incision site)   Complete by: As directed    Call MD for:  severe uncontrolled pain   Complete by: As directed    Call MD for:  temperature >100.4   Complete by: As directed    Diet - low sodium heart healthy   Complete by: As directed    Discharge instructions   Complete by: As directed    [facility] Procedure, Care After  Procedure MD: Dr. Benson Norway Clinical Coordinator: Lenice Llamas, RN   This sheet gives you information about how to care for yourself after your procedure. Your health care provider may also give you more specific instructions. If you have problems or questions, contact your health care provider.  What can I expect after the procedure? After the procedure, it is common to have: Bruising around your puncture site. Tenderness around your puncture site. Tiredness (fatigue).  Medication instructions It is very important to continue to take your blood thinner as directed by your doctor after the Watchman procedure. Call your procedure doctor's office with question or concerns. If you are on Coumadin (warfarin), you will have your INR checked the week after your procedure, with a goal INR of 2.0 - 3.0. Please follow your medication instructions on your discharge summary. Only take the medications listed on your discharge paperwork.  Follow up You will be seen in 1 month after your procedure in the [facility] [place] will have another TEE (Transesophageal Echocardiogram) approximately 6 weeks after your procedure mark to check your device You will follow up the MD/APP who performed your procedure 6 months after your procedure The Watchman Clinical Coordinator will check in with you from time to time, including 1 and 2 years after your procedure.    Follow these instructions at home: Puncture site care  Follow instructions from your health care provider about how to take care of your puncture site.  Make sure you: If present, leave stitches (sutures), skin glue, or adhesive strips in place.  If a large square bandage is present, this may be removed 24 hours after surgery.  Check your puncture site every day for signs of infection. Check for: Redness, swelling, or pain. Fluid or blood. If your puncture site starts to bleed, lie down on your back, apply firm pressure to the area, and contact  your health care provider. Warmth. Pus or a bad smell. Driving Do not drive yourself home if you received sedation Do not drive for at least 4 days after your procedure or however long your health care provider recommends. (Do not resume driving if you have previously been instructed not to drive for other health reasons.) Do not spend greater than 1 hour at a time in a car for the first 3 days. Stop and take a break with a 5 minute walk at least every hour.  Do not drive or use heavy machinery while taking prescription pain medicine.  Activity Avoid activities that take a lot of effort, including exercise, for at least 7 days after your procedure. For the first 3 days, avoid sitting for longer than one hour at a time.  Avoid alcoholic beverages, signing paperwork, or participating in legal proceedings for 24 hours after receiving sedation Do not lift anything that is heavier than 10 lb (4.5 kg) for one week.  No sexual activity for 1 week.  Return to your normal activities as told by your health care provider. Ask your health care provider what activities are safe for you. General instructions Take over-the-counter and prescription medicines only as told by your health care provider. Do not use any products that contain nicotine or tobacco, such as cigarettes and e-cigarettes. If you need help quitting, ask your health care provider. You may shower after 24 hours, but Do not take baths, swim, or use a hot tub for 1 week.  Do not drink alcohol for 24 hours after your procedure. Keep all follow-up  visits as told by your health care provider. This is important. Dental Work: You will require antibiotics prior to any dental work, including cleanings, for 6 months after your Watchman implantation to help protect you from infection. After 6 months, antibiotics are no longer required. Contact a health care provider if: You have redness, mild swelling, or pain around your puncture site. You have soreness in your throat or at your puncture site that does not improve after several days You have fluid or blood coming from your puncture site that stops after applying firm pressure to the area. Your puncture site feels warm to the touch. You have pus or a bad smell coming from your puncture site. You have a fever. You have chest pain or discomfort that spreads to your neck, jaw, or arm. You are sweating a lot. You feel nauseous. You have a fast or irregular heartbeat. You have shortness of breath. You are dizzy or light-headed and feel the need to lie down. You have pain or numbness in the arm or leg closest to your puncture site. Get help right away if: Your puncture site suddenly swells. Your puncture site is bleeding and the bleeding does not stop after applying firm pressure to the area. These symptoms may represent a serious problem that is an emergency. Do not wait to see if the symptoms will go away. Get medical help right away. Call your local emergency services (911 in the [place] Do not drive yourself to the hospital. Summary After the procedure, it is normal to have bruising and tenderness at the puncture site in your groin, neck, or forearm. Check your puncture site every day for signs of infection. Get help right away if your puncture site is bleeding and the bleeding does not stop after applying firm pressure to the area. This is a medical emergency.  This information is not intended to replace advice given to  you by your health care provider. Make sure you discuss any questions you  have with your health care provider.   Increase activity slowly   Complete by: As directed        Follow-up Information     Tommie Raymond, NP Follow up on 08/19/2021.   Specialty: Cardiology Why: at 11:30am. Please arrive to your appointment at 11:15am. Contact information: [place] [address] [phone]                Duration of Discharge Encounter: Greater than 30 minutes including physician time.  Signed, Sabrina Drown, NP  08/02/2021 9:30 AM

## 2021-08-01 NOTE — Anesthesia Procedure Notes (Signed)
Procedure Name: Intubation Date/Time: 08/01/2021 7:42 AM Performed by: Georgia Duff, CRNA Pre-anesthesia Checklist: Patient identified, Emergency Drugs available, Suction available and Patient being monitored Patient Re-evaluated:Patient Re-evaluated prior to induction Oxygen Delivery Method: Circle System Utilized Preoxygenation: Pre-oxygenation with 100% oxygen Induction Type: IV induction Ventilation: Mask ventilation without difficulty Laryngoscope Size: Miller and 2 Grade View: Grade I Tube type: Oral Tube size: 7.0 mm Number of attempts: 1 Airway Equipment and Method: Stylet and Oral airway Placement Confirmation: ETT inserted through vocal cords under direct vision, positive ETCO2 and breath sounds checked- equal and bilateral Secured at: 21 cm Tube secured with: Tape Dental Injury: Teeth and Oropharynx as per pre-operative assessment

## 2021-08-01 NOTE — Transfer of Care (Signed)
Immediate Anesthesia Transfer of Care Note  Patient: Sabrina [name]  Procedure(s) Performed: LEFT ATRIAL APPENDAGE OCCLUSION TRANSESOPHAGEAL ECHOCARDIOGRAM (TEE)  Patient Location: PACU  Anesthesia Type:General  Level of Consciousness: awake and patient cooperative  Airway & Oxygen Therapy: Patient Spontanous Breathing  Post-op Assessment: Report given to RN and Post -op Vital signs reviewed and stable  Post vital signs: Reviewed and stable  Last Vitals:  Vitals Value Taken Time  BP 150/64 08/01/21 0857  Temp    Pulse 71 08/01/21 0858  Resp 24 08/01/21 0858  SpO2 100 % 08/01/21 0858  Vitals shown include unvalidated device data.  Last Pain:  Vitals:   08/01/21 0550  TempSrc: Oral         Complications: No notable events documented.

## 2021-08-01 NOTE — Op Note (Signed)
  [place] Procedure   Surgeon: Lars Mage, MD Co-Surgeon: Sherren Mocha, MD Anesthesia: Laurie Panda, MD Imager: Rudean Haskell, MD and Jenkins Rouge, MD   Procedure: 1) Transseptal Puncture 2) Watchman left atrial appendage occlusion 3) Perclose right femoral vein   Device Implant: 24 mm Watchman FLXDevice LOT # [id number]   Background and Indication: 75 yo with permanent atrial fibrillation, prior GI bleeding, felt to be a poor candidate for long term anticoagulation. She is referred for the above procedure   Procedure description: US guidance is used for right femoral venous access. [place] images are stored digitally in the patient's chart. Double Preclose is performed at 10" and 2" positions using normal technique and a after progressively dilating the femoral vein over an Amplatz Superstiff wire, a 16 Fr sheath is inserted. Transseptal puncture is then performed after fully anticoagulating the patient with unfractionated heparin. A therapeutic ACT greater than 250 seconds is achieved. A Versacross Connect system is used with RF energy to cross the interatrial septum under fluoroscopic and TEE guidance. Please see Dr Mardene Speak detailed report for further details of Watchman souble curve access sheath insertion.   Once the 14 Fr access sheath is in appropriate position in the left atrial appendage, a 24 mm Watchman Flx device is inserted and deployed, demonstrating appropriate position and compression in the left atrial appendage. Thorough TEE imaging is performed to evaluate all PASS criteria prior to device release. After device release, the device remains in stable position with complete occlusion of the appendage. The sheath is removed from the body and the previously deployed perclose sutures are tightened for complete hemostasis.   Conclusion: Successful implantation of a 24 mm Watchman left atrial appendage  occlusion device using fluoroscopic and transesophageal echo guidance

## 2021-08-02 LAB — BASIC METABOLIC PANEL
Anion gap: 7 (ref 5–15)
BUN: 20 mg/dL (ref 8–23)
CO2: 31 mmol/L (ref 22–32)
Calcium: 8.9 mg/dL (ref 8.9–10.3)
Chloride: 101 mmol/L (ref 98–111)
Creatinine, Ser: 1.06 mg/dL — ABNORMAL HIGH (ref 0.44–1.00)
GFR, Estimated: 55 mL/min — ABNORMAL LOW (ref >60)
Glucose, Bld: 117 mg/dL — ABNORMAL HIGH (ref 70–99)
Potassium: 3.5 mmol/L (ref 3.5–5.1)
Sodium: 139 mmol/L (ref 135–145)

## 2021-08-02 MED ORDER — AMOXICILLIN 500 MG PO CAPS: 2000.0000 mg | ORAL_CAPSULE | ORAL | 12 refills | Status: DC

## 2021-08-02 MED ORDER — ASPIRIN 81 MG PO TBEC: 81.0000 mg | DELAYED_RELEASE_TABLET | Freq: Every day | ORAL | 11 refills | Status: AC

## 2021-08-02 NOTE — Discharge Instructions (Signed)
Information on my medicine - ELIQUIS (apixaban)  This medication education was reviewed with me or my healthcare representative as part of my discharge preparation.    Why was Eliquis prescribed for you? Eliquis was prescribed for you to reduce the risk of forming blood clots that can cause a stroke.  What do You need to know about Eliquis ? Take your Eliquis TWICE DAILY - one tablet in the morning and one tablet in the evening with or without food.  It would be best to take the doses about the same time each day.  You will take this for 4 weeks (through July 30, 2021).  If you have difficulty swallowing the tablet whole please discuss with your pharmacist how to take the medication safely.  Take Eliquis exactly as prescribed by your doctor and DO NOT stop taking Eliquis without talking to the doctor who prescribed the medication.  Stopping may increase your risk of developing a new clot or stroke.  Refill your prescription before you run out.  After discharge, you should have regular check-up appointments with your healthcare provider that is prescribing your Eliquis.  In the future your dose may need to be changed if your kidney function or weight changes by a significant amount or as you get older.  What do you do if you miss a dose? If you miss a dose, take it as soon as you remember on the same day and resume taking twice daily.  Do not take more than one dose of ELIQUIS at the same time.  Important Safety Information A possible side effect of Eliquis is bleeding. You should call your healthcare provider right away if you experience any of the following: Bleeding from an injury or your nose that does not stop. Unusual colored urine (red or dark brown) or unusual colored stools (red or black). Unusual bruising for unknown reasons. A serious fall or if you hit your head (even if there is no bleeding).  Some medicines may interact with Eliquis and might increase your risk of  bleeding or clotting while on Eliquis. To help avoid this, consult your healthcare provider or pharmacist prior to using any new prescription or non-prescription medications, including herbals, vitamins, non-steroidal anti-inflammatory drugs (NSAIDs) and supplements.  This website has more information on Eliquis (apixaban): [email or web address].no.

## 2021-08-03 NOTE — Anesthesia Postprocedure Evaluation (Signed)
Anesthesia Post Note  Patient: Sabrina [name]  Procedure(s) Performed: LEFT ATRIAL APPENDAGE OCCLUSION TRANSESOPHAGEAL ECHOCARDIOGRAM (TEE)     Patient location during evaluation: Cath Lab Anesthesia Type: General Level of consciousness: awake and alert Pain management: pain level controlled Vital Signs Assessment: post-procedure vital signs reviewed and stable Respiratory status: spontaneous breathing, nonlabored ventilation, respiratory function stable and patient connected to nasal cannula oxygen Cardiovascular status: blood pressure returned to baseline and stable Postop Assessment: no apparent nausea or vomiting Anesthetic complications: no   No notable events documented.  Last Vitals:  Vitals:   08/02/21 0357 08/02/21 0722  BP: 100/70 101/67  Pulse: 73 70  Resp: 16 17  Temp:  36.8 C  SpO2: 97% 96%    Last Pain:  Vitals:   08/02/21 0814  TempSrc:   PainSc: (P) 0-No pain                 Tonika Eden

## 2021-08-05 ENCOUNTER — Telehealth: Payer: Self-pay

## 2021-08-05 NOTE — Telephone Encounter (Signed)
Transition Care Management Unsuccessful Follow-up Telephone Call  Date of discharge and from where:  08/02/2021 / [facility]  Attempts:  1st Attempt  Reason for unsuccessful TCM follow-up call:  HIPAA compliant.  Left voice message  Quinn Plowman RN,BSN,CCM RN Case Manager [place] [phone]

## 2021-08-05 NOTE — Telephone Encounter (Signed)
  [place] Team  Contacted the patient regarding discharge from [facility] on 08/02/2021  The patient understands to follow up with Kathyrn Drown on 08/19/2021 in preparation for TEE on 09/13/2021.  The patient understands discharge instructions? Yes  The patient understands medications and regimen? Yes -she is taking Eliquis and ASA as directed  The patient reports groin sites look healthy with no bleeding/ oozing or S/S of infection.   The patient understands to call with any questions or concerns prior to scheduled visit.

## 2021-08-06 ENCOUNTER — Ambulatory Visit: Payer: Medicare HMO | Admitting: Cardiology

## 2021-08-07 ENCOUNTER — Telehealth: Payer: Self-pay

## 2021-08-07 NOTE — Telephone Encounter (Signed)
Transition Care Management Unsuccessful Follow-up Telephone Call  Date of discharge and from where:  08/02/2021   [facility]  Attempts:  2nd Attempt  Reason for unsuccessful TCM follow-up call:  No answer/busy  Tomasa Rand, RN, BSN, CEN [place] Coordinator [phone]

## 2021-08-08 ENCOUNTER — Ambulatory Visit (INDEPENDENT_AMBULATORY_CARE_PROVIDER_SITE_OTHER): Payer: Medicare HMO | Admitting: Family

## 2021-08-08 ENCOUNTER — Encounter: Payer: Self-pay | Admitting: Family

## 2021-08-08 ENCOUNTER — Other Ambulatory Visit: Payer: Self-pay

## 2021-08-08 VITALS — BP 120/80 | HR 75 | Temp 96.0°F | Ht 64.0 in | Wt 154.2 lb

## 2021-08-08 DIAGNOSIS — E559 Vitamin D deficiency, unspecified: Secondary | ICD-10-CM

## 2021-08-08 DIAGNOSIS — E663 Overweight: Secondary | ICD-10-CM | POA: Diagnosis not present

## 2021-08-08 DIAGNOSIS — F411 Generalized anxiety disorder: Secondary | ICD-10-CM | POA: Diagnosis not present

## 2021-08-08 DIAGNOSIS — F331 Major depressive disorder, recurrent, moderate: Secondary | ICD-10-CM | POA: Diagnosis not present

## 2021-08-08 DIAGNOSIS — J4520 Mild intermittent asthma, uncomplicated: Secondary | ICD-10-CM

## 2021-08-08 DIAGNOSIS — I5032 Chronic diastolic (congestive) heart failure: Secondary | ICD-10-CM | POA: Diagnosis not present

## 2021-08-08 DIAGNOSIS — Z950 Presence of cardiac pacemaker: Secondary | ICD-10-CM | POA: Diagnosis not present

## 2021-08-08 DIAGNOSIS — D509 Iron deficiency anemia, unspecified: Secondary | ICD-10-CM

## 2021-08-08 DIAGNOSIS — G4700 Insomnia, unspecified: Secondary | ICD-10-CM

## 2021-08-08 DIAGNOSIS — Z86718 Personal history of other venous thrombosis and embolism: Secondary | ICD-10-CM

## 2021-08-08 DIAGNOSIS — I4891 Unspecified atrial fibrillation: Secondary | ICD-10-CM | POA: Diagnosis not present

## 2021-08-08 DIAGNOSIS — K219 Gastro-esophageal reflux disease without esophagitis: Secondary | ICD-10-CM | POA: Diagnosis not present

## 2021-08-08 NOTE — Patient Instructions (Signed)
Hypokalemia Hypokalemia means that the amount of potassium in the blood is lower than normal. Potassium is a chemical (electrolyte) that helps regulate the amount of fluid in the body. It also stimulates muscle tightening (contraction) and helps nerves work properly. Normally, most of the body's potassium is inside cells, and only a very small amount is in the blood. Because the amount in the blood is so small, minor changes to potassium levels in the blood can be life-threatening. What are the causes? This condition may be caused by: Antibiotic medicine. Diarrhea or vomiting. Taking too much of a medicine that helps you have a bowel movement (laxative) can cause diarrhea and lead to hypokalemia. Chronic kidney disease (CKD). Medicines that help the body get rid of excess fluid (diuretics). Eating disorders, such as bulimia. Low magnesium levels in the body. Sweating a lot. What are the signs or symptoms? Symptoms of this condition include: Weakness. Constipation. Fatigue. Muscle cramps. Mental confusion. Skipped heartbeats or irregular heartbeat (palpitations). Tingling or numbness. How is this diagnosed? This condition is diagnosed with a blood test. How is this treated? This condition may be treated by: Taking potassium supplements by mouth. Adjusting the medicines that you take. Eating more foods that contain a lot of potassium. If your potassium level is very low, you may need to get potassium through an IV and be monitored in the hospital. Follow these instructions at home:  Take over-the-counter and prescription medicines only as told by your health care provider. This includes vitamins and supplements. Eat a healthy diet. A healthy diet includes fresh fruits and vegetables, whole grains, healthy fats, and lean proteins. If instructed, eat more foods that contain a lot of potassium. This includes: Nuts, such as peanuts and pistachios. Seeds, such as sunflower seeds and  pumpkin seeds. Peas, lentils, and lima beans. Whole grain and bran cereals and breads. Fresh fruits and vegetables, such as apricots, avocado, bananas, cantaloupe, kiwi, oranges, tomatoes, asparagus, and potatoes. Orange juice. Tomato juice. Red meats. Yogurt. Keep all follow-up visits as told by your health care provider. This is important. Contact a health care provider if you: Have weakness that gets worse. Feel your heart pounding or racing. Vomit. Have diarrhea. Have diabetes (diabetes mellitus) and you have trouble keeping your blood sugar (glucose) in your target range. Get help right away if you: Have chest pain. Have shortness of breath. Have vomiting or diarrhea that lasts for more than 2 days. Faint. Summary Hypokalemia means that the amount of potassium in the blood is lower than normal. This condition is diagnosed with a blood test. Hypokalemia may be treated by taking potassium supplements, adjusting the medicines that you take, or eating more foods that are high in potassium. If your potassium level is very low, you may need to get potassium through an IV and be monitored in the hospital. This information is not intended to replace advice given to you by your health care provider. Make sure you discuss any questions you have with your health care provider. Document Revised: 04/20/2018 Document Reviewed: 04/21/2018 Elsevier Patient Education  2022 [employer]  

## 2021-08-08 NOTE — Progress Notes (Signed)
Subjective:    Patient ID: Sabrina [name], female    DOB: 1945/11/16, 75 y.o.   MRN: [id number]  Chief Complaint  Patient presents with   Medical Management of Chronic Issues   Pt presents to the office today for chronic follow up. Pt is followed by Cardiologists every 4 months for A Fib and CHF. Has a pacemaker.    She is followed by Neurologists every 3 months for weakness and near syncope episodes. Congestive Heart Failure Presents for follow-up visit. Associated symptoms include fatigue. Pertinent negatives include no edema or shortness of breath. The symptoms have been stable.  Hypertension This is a chronic problem. The current episode started more than 1 year ago. The problem has been resolved since onset. The problem is controlled. Associated symptoms include anxiety, malaise/fatigue and peripheral edema (improving). Pertinent negatives include no shortness of breath or sweats. Risk factors for coronary artery disease include dyslipidemia and sedentary lifestyle. The current treatment provides moderate improvement.  Asthma There is no cough, shortness of breath or wheezing. The current episode started more than 1 year ago. The problem occurs intermittently. Associated symptoms include heartburn and malaise/fatigue. Pertinent negatives include no dyspnea on exertion, ear congestion, nasal congestion, sneezing or sweats. She reports moderate improvement on treatment. Her past medical history is significant for asthma.  Gastroesophageal Reflux She complains of belching and heartburn. She reports no coughing or no wheezing. This is a chronic problem. The current episode started more than 1 year ago. The problem occurs occasionally. Associated symptoms include fatigue. She has tried a PPI for the symptoms. The treatment provided moderate relief.  Anemia Presents for follow-up visit. Symptoms include malaise/fatigue.  Insomnia Primary symptoms: difficulty falling asleep, frequent  awakening, malaise/fatigue.   The current episode started more than one year. The onset quality is gradual. The problem occurs intermittently. PMH includes: depression.   Hyperlipidemia This is a chronic problem. The current episode started more than 1 year ago. Pertinent negatives include no shortness of breath. Current antihyperlipidemic treatment includes statins. The current treatment provides moderate improvement of lipids. Risk factors for coronary artery disease include dyslipidemia, hypertension, a sedentary lifestyle and post-menopausal.  Anxiety Presents for follow-up visit. Symptoms include excessive worry, insomnia, irritability, nervous/anxious behavior and restlessness. Patient reports no shortness of breath. The severity of symptoms is moderate.   Her past medical history is significant for anemia and asthma.  Depression        This is a chronic problem.  The current episode started more than 1 year ago.   Associated symptoms include fatigue, insomnia and restlessness.  Associated symptoms include no helplessness, no hopelessness and not sad.  Past treatments include SNRIs - Serotonin and norepinephrine reuptake inhibitors.  Past medical history includes anxiety.      Review of Systems  Constitutional:  Positive for fatigue, irritability and malaise/fatigue.  HENT:  Negative for sneezing.   Respiratory:  Negative for cough, shortness of breath and wheezing.   Cardiovascular:  Negative for dyspnea on exertion.  Gastrointestinal:  Positive for heartburn.  Psychiatric/Behavioral:  Positive for depression. The patient is nervous/anxious and has insomnia.   All other systems reviewed and are negative.     Objective:   Physical Exam Vitals reviewed.  Constitutional:      General: She is not in acute distress.    Appearance: She is well-developed.  HENT:     Head: Normocephalic and atraumatic.     Right Ear: Tympanic membrane normal.     Left Ear:  Tympanic membrane normal.   Eyes:     Pupils: Pupils are equal, round, and reactive to light.  Neck:     Thyroid: No thyromegaly.  Cardiovascular:     Rate and Rhythm: Normal rate and regular rhythm.     Heart sounds: Normal heart sounds. No murmur heard. Pulmonary:     Effort: Pulmonary effort is normal. No respiratory distress.     Breath sounds: Normal breath sounds. No wheezing.  Abdominal:     General: Bowel sounds are normal. There is no distension.     Palpations: Abdomen is soft.     Tenderness: There is no abdominal tenderness.  Musculoskeletal:        General: No tenderness. Normal range of motion.     Cervical back: Normal range of motion and neck supple.  Skin:    General: Skin is warm and dry.  Neurological:     Mental Status: She is alert and oriented to person, place, and time.     Cranial Nerves: No cranial nerve deficit.     Motor: Weakness present.     Deep Tendon Reflexes: Reflexes are normal and symmetric.  Psychiatric:        Behavior: Behavior normal.        Thought Content: Thought content normal.        Judgment: Judgment normal.      BP 120/80   Pulse 75   Temp (!) 96 F (35.6 C) (Temporal)   Ht 5\' 4"  (1.626 m)   Wt 154 lb 3.2 oz (69.9 kg)   BMI 26.47 kg/m      Assessment & Plan:  Sabrina [name] comes in today with chief complaint of Medical Management of Chronic Issues   Diagnosis and orders addressed:  1. Chronic diastolic heart failure ([place])  2. Atrial fibrillation with RVR ([place])  3. Mild intermittent asthma without complication  4. Gastroesophageal reflux disease, unspecified whether esophagitis present  5. Pacemaker  6. History of DVT (deep vein thrombosis)  7. Overweight (BMI 25.0-29.9)  8. Generalized anxiety disorder  9. Moderate episode of recurrent major depressive disorder ([place])  10. Iron deficiency anemia, unspecified iron deficiency anemia type  11. Insomnia, unspecified type  12. Vitamin D deficiency   Labs reviewed from  08/01/21 Health Maintenance reviewed Diet and exercise encouraged  Follow up plan: 6 months    Evelina Dun, FNP

## 2021-08-16 NOTE — Progress Notes (Signed)
/ [facility]                                     Cardiology Office Note:    Date:  08/19/2021   ID:  Sabrina [name], DOB 1946/07/24, MRN [id number]  PCP:  Sharion Balloon, FNP  [facility] Cardiologist:  None  [facility] Electrophysiologist:  [name], MD / Dr. Quentin Ore, MD (Watchman)/   Referring MD: Sharion Balloon, FNP   Chief Complaint  Patient presents with   Follow-up    Follow up Watchman    History of Present Illness:    Sabrina [name] is a 75 y.o. female with a hx of GI bleed while on anticoagulation, permanent atrial fibrillation, COPD, GERD, HTN, DVT, DVT with IVC filter>>now removed, and sinus node dysfunction s/p PPM who presents today after Watchman implant.   Sabrina [name] was referred to Dr. Quentin Ore due to her intolerance for long term anticoagulation. She initially had an IVC filter in place however this was removed 06/20/21 by Dr. Carlis Abbott. The risks, benefits, and alternatives to left atrial appendage occlusive device placement device were reviewed with the patient and the patient wished to proceed. She was underwent pre procedure CT imaging on 05/15/21 which showed a windsock LAA with landing zone 21.3 by 29 mm suitable for a 27 mm FLX Watchman device. Given this, the patient was felt to have adequate anatomy for LAAO closure device which was scheduled on 08/01/21.   She was admitted and underwent left atrial appendage occlusive device placement with 24 mm Watchman FLX device by Dr. Quentin Ore. She tolerated the procedure well without complication. She was monitored on telemetry overnight which demonstrated AF with V-pacing. Groin site was without complication on the day of discharge. Medication plan is to continue Aspirin 81mg  PO daily and Elquis 2.5mg  PO BID x 45 days. If follow up TEE demonstrates well-seated device, transition to Aspirin 81mg  PO daily and Plavix 75mg  PO daily to complete 6 months total therapy post implant. After 6 months,  transition to Aspirin 81mg  PO daily. SBE prophylaxis for 6 months will be further discussed at follow up. She will need this for 6 months post implant. Will give Rx for Amoxicillin 2g 1 hour prior to dental procedures   Today she presents and reports she has been doing well and tolerating Eliquis with no issues. She had one brief episode of chest pain described as a brief, sharp pain that lasted 1-3 seconds then subsided on its own. She reports that she had just finished eating. She is very active at baseline and has no chest pain while exercising at the [facility]. She does state that she was using a new machine and the pain may be related to muscle tension. Denies SOB, diaphoresis, recurrent symptoms, LE edema, palpitations, dizziness or syncope.  Past Medical History:  Diagnosis Date   Anemia    Ankle fracture, right    past hx. -"no surgery"   Anxiety    Asthma    CHF (congestive heart failure) ([place]) 2009   Chronic lower back pain    Collagen vascular disease (HCC)    COPD (chronic obstructive pulmonary disease) (HCC)    Depression    Fibromyalgia    GERD (gastroesophageal reflux disease)    Hyperlipidemia    Hypertension    Immature cataract of both eyes    Myocardial infarction ([place])    "  I've had a light one; don't know when it happened" (08/27/2017)   Osteoarthritis    Peripheral neuropathy    legs and feet   Persistent atrial fibrillation ([place])    Presence of Watchman left atrial appendage closure device 08/01/2021   24 mm Watchman FLXDevice LOT # [id number] by Dr. Quentin Ore   Tubular adenoma of colon     Past Surgical History:  Procedure Laterality Date   APPENDECTOMY     AV NODE ABLATION N/A 08/27/2017   Procedure: AV NODE ABLATION;  Surgeon: Evans Lance, MD;  Location: [place] [facility];  Service: Cardiovascular;  Laterality: N/A;   CHOLECYSTECTOMY OPEN  1978   DILATION AND CURETTAGE OF UTERUS     FEMUR FRACTURE SURGERY Left 2013   "put 7" rod in it"   FRACTURE SURGERY      IVC FILTER REMOVAL N/A 06/20/2021   Procedure: IVC FILTER REMOVAL;  Surgeon: Marty Heck, MD;  Location: [place] [facility];  Service: Cardiovascular;  Laterality: N/A;   LAPAROSCOPY  08/22/2016   Procedure: LAPAROSCOPY DIAGNOSTIC;  Surgeon: [name], MD;  Location: [facility];  Service: General;;   LEFT ATRIAL APPENDAGE OCCLUSION N/A 08/01/2021   Procedure: LEFT ATRIAL APPENDAGE OCCLUSION;  Surgeon: Vickie Epley, MD;  Location: [place] [facility];  Service: Cardiovascular;  Laterality: N/A;   MEDIAL PARTIAL KNEE REPLACEMENT Right 2005   "@ [facility]"   PACEMAKER IMPLANT N/A 08/27/2017   Procedure: PACEMAKER IMPLANT;  Surgeon: Evans Lance, MD;  Location: [place] [facility];  Service: Cardiovascular;  Laterality: N/A;   ROUX-EN-Y GASTRIC BYPASS  2002   [place]  2002   TEE WITHOUT CARDIOVERSION N/A 08/01/2021   Procedure: TRANSESOPHAGEAL ECHOCARDIOGRAM (TEE);  Surgeon: Vickie Epley, MD;  Location: [place] [facility];  Service: Cardiovascular;  Laterality: N/A;   TONSILLECTOMY  1944   TUBAL LIGATION     VAGINAL HYSTERECTOMY      Current Medications: Current Meds  Medication Sig   albuterol (VENTOLIN HFA) 108 (90 Base) MCG/ACT inhaler TAKE 2 PUFFS BY MOUTH EVERY 6 HOURS AS NEEDED FOR WHEEZE OR SHORTNESS OF BREATH (Patient taking differently: Inhale 2 puffs into the lungs in the morning and at bedtime.)   apixaban (ELIQUIS) 2.5 MG TABS tablet Take 1 tablet (2.5 mg total) by mouth 2 (two) times daily. (Patient taking differently: Take 5 mg by mouth 2 (two) times daily.)   aspirin EC 81 MG EC tablet Take 1 tablet (81 mg total) by mouth daily. Swallow whole.   atorvastatin (LIPITOR) 20 MG tablet Take 1 tablet (20 mg total) by mouth daily. (Patient taking differently: Take 20 mg by mouth at bedtime.)   Cholecalciferol (PA VITAMIN D-3) 50 MCG (2000 UT) CAPS Take 2,000 Units by mouth in the morning.   cromolyn (NASALCROM) 5.2 MG/ACT nasal spray  Place 1 spray into both nostrils in the morning and at bedtime.   cyanocobalamin (,VITAMIN B-12,) 1000 MCG/ML injection Inject 1,000 mcg into the muscle every 30 (thirty) days.   Cyanocobalamin (VITAMIN B-12) 5000 MCG SUBL Place 5,000 mcg under the tongue in the morning.   DULoxetine (CYMBALTA) 60 MG capsule TAKE 1 CAPSULE EVERY DAY (Patient taking differently: Take 60 mg by mouth at bedtime. TAKE 1 CAPSULE EVERY DAY)   ferrous sulfate 325 (65 FE) MG tablet Take 325 mg by mouth 3 (three) times a week.   furosemide (LASIX) 40 MG tablet Take 1-1.5 tablets (40-60 mg total) by  mouth See admin instructions. Take 1.5 tablets (60 mg) by mouth once daily in the morning & may take an additional tablet (40 mg) in the evening if needed for fluid retention. (Patient taking differently: Take 40 mg by mouth 2 (two) times daily.)   gabapentin (NEURONTIN) 100 MG capsule Take 1 capsule (100 mg total) by mouth 2 (two) times daily. Needs to be seen   Multiple Vitamin (MULTIVITAMIN WITH MINERALS) TABS tablet Take 1 tablet by mouth in the morning.   Omega-3 Fatty Acids (FISH OIL PO) Take 1 capsule by mouth daily.   pantoprazole (PROTONIX) 40 MG tablet Take 1 tablet (40 mg total) by mouth 2 (two) times daily.   potassium chloride SA (KLOR-CON) 20 MEQ tablet Take 1 tablet (20 mEq total) by mouth daily. (Patient taking differently: Take 20 mEq by mouth 3 (three) times a week.)   SYMBICORT 80-4.5 MCG/ACT inhaler Inhale 2 puffs into the lungs in the morning.   triamcinolone ointment (KENALOG) 0.5 % Apply 1 application topically 2 (two) times daily. (Patient taking differently: Apply 1 application topically 2 (two) times daily as needed (irritation).)   vitamin C (ASCORBIC ACID) 500 MG tablet Take 500 mg by mouth daily.   Vitamin D, Ergocalciferol, (DRISDOL) 1.25 MG (50000 UNIT) CAPS capsule Take 1 capsule (50,000 Units total) by mouth every 7 (seven) days. (Patient taking differently: Take 50,000 Units by mouth every  Wednesday.)   Current Facility-Administered Medications for the 08/19/21 encounter (Office Visit) with Tommie Raymond, NP  Medication   cyanocobalamin ((VITAMIN B-12)) injection 1,000 mcg     Allergies:   Codeine   Social History   Socioeconomic History   Marital status: Divorced    Spouse name: Not on file   Number of children: 3   Years of education: 2 years of college   Highest education level: Some college, no degree  Occupational History   Occupation: Retired  Tobacco Use   Smoking status: Former    Packs/day: 0.50    Years: 25.00    Pack years: 12.50    Types: Cigarettes    Quit date: 02/26/1993    Years since quitting: 28.4   Smokeless tobacco: Never  Vaping Use   Vaping Use: Never used  Substance and Sexual Activity   Alcohol use: Not Currently    Comment: 08/27/2017 "nothing since early 2000s"   Drug use: Not Currently   Sexual activity: Not Currently    Birth control/protection: Surgical  Other Topics Concern   Not on file  Social History Narrative   ** Merged History Encounter **       Pt is right handed Lives in single story home with her grandson Has 3 adult children Associated degree  Retired Quarry manager   Social Determinants of Radio broadcast assistant Strain: Low Risk    Difficulty of Paying Living Expenses: Not very hard  Food Insecurity: No Food Insecurity   Worried About Charity fundraiser in the Last Year: Never true   Arboriculturist in the Last Year: Never true  Transportation Needs: No Transportation Needs   Lack of Transportation (Medical): No   Lack of Transportation (Non-Medical): No  Physical Activity: Sufficiently Active   Days of Exercise per Week: 3 days   Minutes of Exercise per Session: 60 min  Stress: Stress Concern Present   Feeling of Stress : To some extent  Social Connections: Moderately Integrated   Frequency of Communication with Friends and Family: More than three times  a week   Frequency of Social Gatherings with  Friends and Family: Twice a week   Attends Religious Services: 1 to 4 times per year   Active Member of [employer] or Organizations: Yes   Attends Archivist Meetings: 1 to 4 times per year   Marital Status: Divorced     Family History: The patient's family history includes Alzheimer's disease in her mother; Arthritis in her daughter, father, son, and son; Asthma in her daughter; Cancer in her mother; Colon cancer (age of onset: 9) in her paternal uncle; Heart disease in her mother; Hyperlipidemia in her son; Obesity in her daughter, son, and son.  ROS:   Please see the history of present illness.    All other systems reviewed and are negative.  EKGs/Labs/Other Studies Reviewed:    The following studies were reviewed today:  LAAO Watchman implant 08/01/21:  Procedures This Admission:  Transeptal Puncture Intra-procedural TEE which showed no LAA thrombus Left atrial appendage occlusive device placement on 08/01/21 by Dr. Quentin Ore.    Device Implant: 24 mm Watchman FLXDevice LOT # [id number]   Conclusion: Successful implantation of a 24 mm Watchman left atrial appendage occlusion device using fluoroscopic and transesophageal echo guidance   Post Implant Anticoagulation Strategy: Aspirin 81mg  PO daily and Apixaban 2.5mg  PO BID x 45 days. If follow up TEE demonstrates well-seated device, transition to Aspirin 81mg  PO daily and Plavix 75mg  PO daily to complete 6 months total therapy post implant. After 6 months, transition to Aspirin 81mg  PO daily.  IVC filter explant:   06/20/2021 Pre-operative Diagnosis: IVC filter Post-operative diagnosis:  Same Surgeon:  Marty Heck, MD Procedure Performed: 1.  Ultrasound-guided access right internal jugular vein 2.  Inferior venacavogram 3.  Removal of IVC filter 4.  26 minutes of monitored moderate conscious sedation time   Echocardiogram 05/31/21:   Left ventricular ejection fraction, by estimation, is 60 to 65%. The left  ventricle has normal function. The left ventricle has no regional wall motion abnormalities. Left ventricular diastolic parameters are indeterminate. 1. 2. Right ventricular systolic function is normal. The right ventricular size is normal. 3. Left atrial size was mildly dilated. 4. Right atrial size was mildly dilated. The mitral valve is abnormal. Mild mitral valve regurgitation. No evidence of mitral stenosis. 5. The aortic valve is tricuspid. There is mild calcification of the aortic valve. There is mild thickening of the aortic valve. Aortic valve regurgitation is mild. No aortic stenosis is present. 6. The inferior vena cava is normal in size with greater than 50% respiratory variability, suggesting right atrial pressure of 3 mmHg.  EKG:  EKG is not ordered today.    Recent Labs: 05/07/2021: ALT 15 08/01/2021: Hemoglobin 11.4; Platelets 312 08/02/2021: BUN 20; Creatinine, Ser 1.06; Potassium 3.5; Sodium 139  Recent Lipid Panel    Component Value Date/Time   CHOL 199 09/30/2019 1018   TRIG 137 09/30/2019 1018   HDL 60 09/30/2019 1018   CHOLHDL 3.3 09/30/2019 1018   LDLCALC 115 (H) 09/30/2019 1018   Risk Assessment/Calculations:    CHA2DS2-VASc Score = 4  CHF History: 1, HTN History: 1, Diabetes History: 0, Stroke History: 0, Vascular Disease History: 0, Age Score: 1, Gender Score: 1].  Therefore, the patient's annual risk of stroke is 4.8 %.       HAS-BLED score 2 Hypertension (Uncontrolled in 30 days)  No  Abnormal renal and liver function (Dialysis, transplant, Cr >2.26 mg/dL /Cirrhosis or Bilirubin >2x Normal or AST/ALT/AP >3x Normal)  No  Stroke No  Bleeding Yes  Labile INR (Unstable/high INR) No  Elderly (>65) Yes  Drugs or alcohol (? 8 drinks/week, anti-plt or NSAID) No   Physical Exam:    VS:  BP 112/70   Pulse 88   Ht 5\' 4"  (1.626 m)   Wt 155 lb 12.8 oz (70.7 kg)   SpO2 98%   BMI 26.74 kg/m     Wt Readings from Last 3 Encounters:  08/19/21 155 lb  12.8 oz (70.7 kg)  08/08/21 154 lb 3.2 oz (69.9 kg)  08/01/21 155 lb (70.3 kg)    General: Well developed, well nourished, NAD Lungs:Clear to ausculation bilaterally. No wheezes, rales, or rhonchi. Breathing is unlabored. Cardiovascular: Irregularly irregular. No murmurs Extremities: No edema. Neuro: Alert and oriented. No focal deficits. No facial asymmetry. MAE spontaneously. Psych: Responds to questions appropriately with normal affect.    ASSESSMENT/PLAN:    Permanent atrial fibrillation: Has a known hx of permanent atrial fibrillation not on anticoagulation due to hx of GI bleed. She was felt to be a good candidate for [place] implantation performed 08/01/21. Tolerating Eliquis 2.5mg  PO BID. If follow up TEE demonstrates well-seated device, transition to Aspirin 81mg  PO daily and Plavix 75mg  PO daily to complete 6 months total therapy post implant. After 6 months, transition to Aspirin 81mg  PO daily. The risks and benefits of transesophageal echocardiogram have been explained including risks of esophageal damage, perforation (1:10,000 risk), bleeding, pharyngeal hematoma as well as other potential complications associated with conscious sedation including aspiration, arrhythmia, respiratory failure and death. Alternatives to treatment were discussed, questions were answered. Patient is willing to proceed.   PPM: Last interrogation 06/28/21 with normal device function, 2 episodes of NSVT, brief 1:1. Continue follow up with EP    GI bleeding: No recurrent bleeding with the addition of reduced dose Eliquis. Obtain CBC today.    Prior IVC filter: Removed 06/20/21 per Dr. Carlis Abbott    Medication Adjustments/Labs and Tests Ordered: Current medicines are reviewed at length with the patient today.  Concerns regarding medicines are outlined above.  Orders Placed This Encounter  Procedures   Basic metabolic panel   CBC   EKG 12-Lead   No orders of the defined types were placed in this  encounter.   Patient Instructions  Medication Instructions:  Your physician recommends that you continue on your current medications as directed. Please refer to the Current Medication list given to you today.  *If you need a refill on your cardiac medications before your next appointment, please call your pharmacy*   Lab Work: Cbc, Bmp- Today   If you have labs (blood work) drawn today and your tests are completely normal, you will receive your results only by: [place] (if you have MyChart) OR A paper copy in the mail If you have any lab test that is abnormal or we need to change your treatment, we will call you to review the results.   Testing/Procedures: TEE scheduled for 09/13/21   Follow-Up: Follow up as scheduled }    Other Instructions You are scheduled for a TEE Cardioversion on 09/13/21 with Dr. Werner Lean, MD. Please arrive at the [facility] (Main Entrance A) at [facility]: [place], [place] [address] at 6:30 AM.   DIET: Nothing to eat or drink after midnight except a sip of water with medications (see medication instructions below)  FYI: For your safety, and to allow [place] to monitor your vital signs accurately during  the surgery/procedure we request that   if you have artificial nails, gel coating, SNS etc. Please have those removed prior to your surgery/procedure. Not having the nail coverings /polish removed may result in cancellation or delay of your surgery/procedure.   Medication Instructions: Hold Lasix the AM of your procedure   Continue your anticoagulant: Eliquis  You will need to continue your anticoagulant after your procedure until you are told by your provider that it is safe to stop   You must have a responsible person to drive you home and stay in the waiting area during your procedure. Failure to do so could result in cancellation.  Bring your insurance cards.  *Special Note: Every effort is made to  have your procedure done on time. Occasionally there are emergencies that occur at the hospital that may cause delays. Please be patient if a delay does occur.       Signed, Kathyrn Drown, NP  08/19/2021 12:24 PM    [facility]

## 2021-08-19 ENCOUNTER — Ambulatory Visit (INDEPENDENT_AMBULATORY_CARE_PROVIDER_SITE_OTHER): Payer: Medicare HMO

## 2021-08-19 ENCOUNTER — Ambulatory Visit (INDEPENDENT_AMBULATORY_CARE_PROVIDER_SITE_OTHER): Payer: Medicare HMO | Admitting: Cardiology

## 2021-08-19 ENCOUNTER — Other Ambulatory Visit: Payer: Self-pay

## 2021-08-19 ENCOUNTER — Encounter: Payer: Self-pay | Admitting: Cardiology

## 2021-08-19 VITALS — BP 112/70 | HR 88 | Ht 64.0 in | Wt 155.8 lb

## 2021-08-19 DIAGNOSIS — Z950 Presence of cardiac pacemaker: Secondary | ICD-10-CM

## 2021-08-19 DIAGNOSIS — I10 Essential (primary) hypertension: Secondary | ICD-10-CM

## 2021-08-19 DIAGNOSIS — K922 Gastrointestinal hemorrhage, unspecified: Secondary | ICD-10-CM | POA: Diagnosis not present

## 2021-08-19 DIAGNOSIS — Z01818 Encounter for other preprocedural examination: Secondary | ICD-10-CM | POA: Diagnosis not present

## 2021-08-19 DIAGNOSIS — Z95818 Presence of other cardiac implants and grafts: Secondary | ICD-10-CM | POA: Diagnosis not present

## 2021-08-19 DIAGNOSIS — E538 Deficiency of other specified B group vitamins: Secondary | ICD-10-CM | POA: Diagnosis not present

## 2021-08-19 DIAGNOSIS — I4821 Permanent atrial fibrillation: Secondary | ICD-10-CM

## 2021-08-19 LAB — BASIC METABOLIC PANEL
BUN/Creatinine Ratio: 21 (ref 12–28)
BUN: 19 mg/dL (ref 8–27)
CO2: 30 mmol/L — ABNORMAL HIGH (ref 20–29)
Calcium: 9 mg/dL (ref 8.7–10.3)
Chloride: 100 mmol/L (ref 96–106)
Creatinine, Ser: 0.91 mg/dL (ref 0.57–1.00)
Glucose: 94 mg/dL (ref 70–99)
Potassium: 3.7 mmol/L (ref 3.5–5.2)
Sodium: 144 mmol/L (ref 134–144)
eGFR: 66 mL/min/{1.73_m2} (ref >59)

## 2021-08-19 LAB — CBC
Hematocrit: 38.1 % (ref 34.0–46.6)
Hemoglobin: 12.4 g/dL (ref 11.1–15.9)
MCH: 28.1 pg (ref 26.6–33.0)
MCHC: 32.5 g/dL (ref 31.5–35.7)
MCV: 86 fL (ref 79–97)
Platelets: 370 10*3/uL (ref 150–450)
RBC: 4.41 x10E6/uL (ref 3.77–5.28)
RDW: 13.2 % (ref 11.7–15.4)
WBC: 7.7 10*3/uL (ref 3.4–10.8)

## 2021-08-19 NOTE — Progress Notes (Signed)
Cyanocobalamin injection given to right deltoid.  Patient tolerated well. 

## 2021-08-19 NOTE — Patient Instructions (Signed)
Medication Instructions:  Your physician recommends that you continue on your current medications as directed. Please refer to the Current Medication list given to you today.  *If you need a refill on your cardiac medications before your next appointment, please call your pharmacy*   Lab Work: Cbc, Bmp- Today   If you have labs (blood work) drawn today and your tests are completely normal, you will receive your results only by: [place] (if you have MyChart) OR A paper copy in the mail If you have any lab test that is abnormal or we need to change your treatment, we will call you to review the results.   Testing/Procedures: TEE scheduled for 09/13/21   Follow-Up: Follow up as scheduled }    Other Instructions You are scheduled for a TEE Cardioversion on 09/13/21 with Dr. Werner Lean, MD. Please arrive at the [facility] (Main Entrance A) at [facility]: [place], [place] [address] at 6:30 AM.   DIET: Nothing to eat or drink after midnight except a sip of water with medications (see medication instructions below)  FYI: For your safety, and to allow [place] to monitor your vital signs accurately during the surgery/procedure we request that   if you have artificial nails, gel coating, SNS etc. Please have those removed prior to your surgery/procedure. Not having the nail coverings /polish removed may result in cancellation or delay of your surgery/procedure.   Medication Instructions: Hold Lasix the AM of your procedure   Continue your anticoagulant: Eliquis  You will need to continue your anticoagulant after your procedure until you are told by your provider that it is safe to stop   You must have a responsible person to drive you home and stay in the waiting area during your procedure. Failure to do so could result in cancellation.  Bring your insurance cards.  *Special Note: Every effort is made to have your procedure done on time.  Occasionally there are emergencies that occur at the hospital that may cause delays. Please be patient if a delay does occur.

## 2021-08-21 ENCOUNTER — Other Ambulatory Visit: Payer: Self-pay | Admitting: Family

## 2021-08-23 ENCOUNTER — Other Ambulatory Visit: Payer: Self-pay | Admitting: Family

## 2021-08-23 DIAGNOSIS — M26609 Unspecified temporomandibular joint disorder, unspecified side: Secondary | ICD-10-CM

## 2021-08-23 DIAGNOSIS — Z23 Encounter for immunization: Secondary | ICD-10-CM | POA: Diagnosis not present

## 2021-08-27 ENCOUNTER — Other Ambulatory Visit: Payer: Self-pay | Admitting: Family

## 2021-08-27 DIAGNOSIS — J4520 Mild intermittent asthma, uncomplicated: Secondary | ICD-10-CM

## 2021-09-02 ENCOUNTER — Encounter (HOSPITAL_COMMUNITY): Payer: Self-pay | Admitting: Internal Medicine

## 2021-09-03 NOTE — Progress Notes (Signed)
Attempted to obtain medical history via telephone, unable to reach at this time. I left a voicemail to return pre surgical testing department's phone call.  

## 2021-09-12 ENCOUNTER — Encounter (HOSPITAL_COMMUNITY): Payer: Self-pay | Admitting: Certified Registered Nurse Anesthetist

## 2021-09-13 ENCOUNTER — Encounter (HOSPITAL_COMMUNITY): Admission: RE | Payer: Medicare HMO | Source: Home / Self Care

## 2021-09-13 ENCOUNTER — Telehealth: Payer: Self-pay

## 2021-09-13 ENCOUNTER — Inpatient Hospital Stay (HOSPITAL_COMMUNITY): Admission: RE | Admit: 2021-09-13 | Payer: Medicare HMO | Source: Ambulatory Visit

## 2021-09-13 ENCOUNTER — Ambulatory Visit (HOSPITAL_COMMUNITY): Admission: RE | Admit: 2021-09-13 | Payer: Medicare HMO | Source: Home / Self Care | Admitting: Internal Medicine

## 2021-09-13 SURGERY — ECHOCARDIOGRAM, TRANSESOPHAGEAL
Anesthesia: Monitor Anesthesia Care

## 2021-09-13 NOTE — Progress Notes (Signed)
Called patient morning of procedure when she hadn't shown up. Pt stated she had cancelled with the office earlier and was going to reschedule with the office.

## 2021-09-13 NOTE — Telephone Encounter (Signed)
The patient no-showed to her 45-day LAAO TEE today. Attempted to call the patient but it seems her phone is cut off. It is documented that Ms. Enterline called the office and cancelled but there is no phone note of this occurring.   There is no TEE availability in the window. Will route to Dr. Quentin Ore to see if CT may be completed instead (GFR=66 3 weeks ago).

## 2021-09-18 ENCOUNTER — Ambulatory Visit: Payer: Medicare HMO

## 2021-09-18 ENCOUNTER — Other Ambulatory Visit: Payer: Self-pay

## 2021-09-18 ENCOUNTER — Telehealth (HOSPITAL_COMMUNITY): Payer: Self-pay | Admitting: Emergency Medicine

## 2021-09-18 ENCOUNTER — Ambulatory Visit (INDEPENDENT_AMBULATORY_CARE_PROVIDER_SITE_OTHER): Payer: Medicare HMO

## 2021-09-18 ENCOUNTER — Other Ambulatory Visit: Payer: Self-pay | Admitting: Cardiology

## 2021-09-18 DIAGNOSIS — Z95818 Presence of other cardiac implants and grafts: Secondary | ICD-10-CM

## 2021-09-18 DIAGNOSIS — E538 Deficiency of other specified B group vitamins: Secondary | ICD-10-CM | POA: Diagnosis not present

## 2021-09-18 DIAGNOSIS — I4891 Unspecified atrial fibrillation: Secondary | ICD-10-CM

## 2021-09-18 MED ORDER — METOPROLOL TARTRATE 25 MG PO TABS: ORAL_TABLET | ORAL | 0 refills | Status: DC

## 2021-09-18 NOTE — Telephone Encounter (Signed)
Reaching out to patient to offer assistance regarding upcoming cardiac imaging study; pt verbalizes understanding of appt date/time, parking situation and where to check in, pre-test NPO status and medications ordered, and verified current allergies; name and call back number provided for further questions should they arise Marchia Bond RN Navigator Cardiac Imaging [place] [facility] [phone] office [phone] cell  Denies iv issues Reminded to pick up metoprolol from [facility] and to take 2 hr prior to scan Arrival 10:00a for 1030 appt

## 2021-09-18 NOTE — Progress Notes (Signed)
Cyanocobalamin injection given to left deltoid.  Patient tolerated well. 

## 2021-09-20 ENCOUNTER — Other Ambulatory Visit: Payer: Self-pay

## 2021-09-20 ENCOUNTER — Ambulatory Visit (HOSPITAL_COMMUNITY)
Admission: RE | Admit: 2021-09-20 | Discharge: 2021-09-20 | Disposition: A | Discharge status: Home / Self Care | Payer: Medicare HMO | Source: Ambulatory Visit | Attending: Cardiology | Admitting: Cardiology

## 2021-09-20 DIAGNOSIS — Z95818 Presence of other cardiac implants and grafts: Secondary | ICD-10-CM | POA: Diagnosis not present

## 2021-09-20 DIAGNOSIS — I4891 Unspecified atrial fibrillation: Secondary | ICD-10-CM

## 2021-09-20 MED ORDER — IOHEXOL 350 MG/ML SOLN
75.0000 mL | Freq: Once | INTRAVENOUS | Status: AC | PRN
  Administered 2021-09-20: 15:00:00 75 mL via INTRAVENOUS

## 2021-09-24 ENCOUNTER — Telehealth: Payer: Self-pay

## 2021-09-24 MED ORDER — CLOPIDOGREL BISULFATE 75 MG PO TABS: 75.0000 mg | ORAL_TABLET | Freq: Every day | ORAL | 1 refills | Status: DC

## 2021-09-24 NOTE — Telephone Encounter (Signed)
Per Dr. Kathalene Frames CT report, "Left atrial appendage: A 24 mm Watchman FLX is present (Implant 08/01/2021). The device is well seated. There is no device related thrombus or device leak present. There is appropriate thrombus in the device and the appendage is successfully closed." Per Sharee Pimple McDaniel's recent office note, "Medication plan is to continue Aspirin 81mg  PO daily and Elquis 2.5mg  PO BID x 45 days. If follow up TEE demonstrates well-seated device, transition to Aspirin 81mg  PO daily and Plavix 75mg  PO daily to complete 6 months total therapy post implant."  Per Watchman protocol, instructed patient to STOP ELIQUIS and START PLAVIX 75 mg daily.  Confirmed 6 month visit with Kathyrn Drown in April 2023.  She was grateful for call and agreed with plan.

## 2021-09-27 ENCOUNTER — Ambulatory Visit (INDEPENDENT_AMBULATORY_CARE_PROVIDER_SITE_OTHER): Payer: Medicare HMO

## 2021-09-27 DIAGNOSIS — I442 Atrioventricular block, complete: Secondary | ICD-10-CM

## 2021-09-29 LAB — CUP PACEART REMOTE DEVICE CHECK
Battery Remaining Longevity: 60 mo
Battery Voltage: 2.89 V
Brady Statistic AP VP Percent: 0.15 %
Brady Statistic AP VS Percent: 99.8 %
Brady Statistic AS VP Percent: 0 %
Brady Statistic AS VS Percent: 0.05 %
Brady Statistic RA Percent Paced: 100 %
Brady Statistic RV Percent Paced: 0.15 %
Date Time Interrogation Session: 20230106011154
Implantable Lead Implant Date: 20181206
Implantable Lead Implant Date: 20181206
Implantable Lead Location: 753860
Implantable Lead Location: 753860
Implantable Lead Model: 3830
Implantable Lead Model: 5076
Implantable Pulse Generator Implant Date: 20181206
Lead Channel Impedance Value: 266 Ohm
Lead Channel Impedance Value: 342 Ohm
Lead Channel Impedance Value: 380 Ohm
Lead Channel Impedance Value: 437 Ohm
Lead Channel Sensing Intrinsic Amplitude: 4.375 mV
Lead Channel Sensing Intrinsic Amplitude: 4.5 mV
Lead Channel Sensing Intrinsic Amplitude: 7 mV
Lead Channel Sensing Intrinsic Amplitude: 7 mV
Lead Channel Setting Pacing Amplitude: 2 V
Lead Channel Setting Pacing Amplitude: 2.5 V
Lead Channel Setting Pacing Pulse Width: 0.3 ms
Lead Channel Setting Sensing Sensitivity: 2 mV

## 2021-10-08 NOTE — Progress Notes (Signed)
Remote pacemaker transmission.   

## 2021-10-21 ENCOUNTER — Ambulatory Visit (INDEPENDENT_AMBULATORY_CARE_PROVIDER_SITE_OTHER): Payer: Medicare HMO | Admitting: *Deleted

## 2021-10-21 DIAGNOSIS — E538 Deficiency of other specified B group vitamins: Secondary | ICD-10-CM

## 2021-10-21 NOTE — Progress Notes (Signed)
Pt given B12 injection IM right deltoid and tolerated well. 

## 2021-11-21 ENCOUNTER — Ambulatory Visit (INDEPENDENT_AMBULATORY_CARE_PROVIDER_SITE_OTHER): Payer: Medicare HMO

## 2021-11-21 DIAGNOSIS — E538 Deficiency of other specified B group vitamins: Secondary | ICD-10-CM | POA: Diagnosis not present

## 2021-11-21 NOTE — Progress Notes (Signed)
Cyanocobalamin injection given to left deltoid.  Patient tolerated well. 

## 2021-11-25 ENCOUNTER — Other Ambulatory Visit: Payer: Self-pay | Admitting: Family

## 2021-11-25 DIAGNOSIS — K219 Gastro-esophageal reflux disease without esophagitis: Secondary | ICD-10-CM

## 2021-12-04 ENCOUNTER — Other Ambulatory Visit: Payer: Self-pay

## 2021-12-04 ENCOUNTER — Telehealth: Payer: Self-pay

## 2021-12-04 DIAGNOSIS — Z1231 Encounter for screening mammogram for malignant neoplasm of breast: Secondary | ICD-10-CM

## 2021-12-04 NOTE — Telephone Encounter (Signed)
Appt for mm and dxa made  ?

## 2021-12-23 ENCOUNTER — Ambulatory Visit: Payer: Medicare HMO

## 2021-12-25 ENCOUNTER — Ambulatory Visit: Payer: Medicare HMO | Admitting: *Deleted

## 2021-12-25 ENCOUNTER — Other Ambulatory Visit: Payer: Self-pay

## 2021-12-25 ENCOUNTER — Ambulatory Visit
Admission: RE | Admit: 2021-12-25 | Discharge: 2021-12-25 | Disposition: A | Discharge status: Home / Self Care | Payer: Medicare HMO | Source: Ambulatory Visit | Attending: Family | Admitting: Family

## 2021-12-25 ENCOUNTER — Ambulatory Visit (INDEPENDENT_AMBULATORY_CARE_PROVIDER_SITE_OTHER): Payer: Medicare HMO | Admitting: Family

## 2021-12-25 DIAGNOSIS — Z780 Asymptomatic menopausal state: Secondary | ICD-10-CM | POA: Diagnosis not present

## 2021-12-25 DIAGNOSIS — E538 Deficiency of other specified B group vitamins: Secondary | ICD-10-CM

## 2021-12-25 DIAGNOSIS — M810 Age-related osteoporosis without current pathological fracture: Secondary | ICD-10-CM | POA: Diagnosis not present

## 2021-12-25 DIAGNOSIS — Z1231 Encounter for screening mammogram for malignant neoplasm of breast: Secondary | ICD-10-CM

## 2021-12-25 DIAGNOSIS — M8588 Other specified disorders of bone density and structure, other site: Secondary | ICD-10-CM | POA: Diagnosis not present

## 2021-12-25 MED ORDER — CYANOCOBALAMIN 1000 MCG/ML IJ SOLN
1000.0000 ug | Freq: Once | INTRAMUSCULAR | Status: AC
  Administered 2021-12-25: 1000 ug via INTRAMUSCULAR

## 2021-12-25 NOTE — Progress Notes (Unsigned)
B12 injection 60m IM left deltoid pt tolerated well  ?

## 2021-12-25 NOTE — Progress Notes (Signed)
Vitamin b12 injection given today by Melissa. ?

## 2021-12-27 ENCOUNTER — Ambulatory Visit (INDEPENDENT_AMBULATORY_CARE_PROVIDER_SITE_OTHER): Payer: Medicare HMO

## 2021-12-27 DIAGNOSIS — I442 Atrioventricular block, complete: Secondary | ICD-10-CM | POA: Diagnosis not present

## 2021-12-30 LAB — CUP PACEART REMOTE DEVICE CHECK
Battery Remaining Longevity: 54 mo
Battery Voltage: 2.89 V
Brady Statistic AP VP Percent: 0.25 %
Brady Statistic AP VS Percent: 99.68 %
Brady Statistic AS VP Percent: 0 %
Brady Statistic AS VS Percent: 0.07 %
Brady Statistic RA Percent Paced: 100 %
Brady Statistic RV Percent Paced: 0.25 %
Date Time Interrogation Session: 20230407021204
Implantable Lead Implant Date: 20181206
Implantable Lead Implant Date: 20181206
Implantable Lead Location: 753860
Implantable Lead Location: 753860
Implantable Lead Model: 3830
Implantable Lead Model: 5076
Implantable Pulse Generator Implant Date: 20181206
Lead Channel Impedance Value: 285 Ohm
Lead Channel Impedance Value: 342 Ohm
Lead Channel Impedance Value: 399 Ohm
Lead Channel Impedance Value: 399 Ohm
Lead Channel Sensing Intrinsic Amplitude: 11.5 mV
Lead Channel Sensing Intrinsic Amplitude: 11.5 mV
Lead Channel Sensing Intrinsic Amplitude: 4.375 mV
Lead Channel Sensing Intrinsic Amplitude: 4.5 mV
Lead Channel Setting Pacing Amplitude: 2 V
Lead Channel Setting Pacing Amplitude: 2.5 V
Lead Channel Setting Pacing Pulse Width: 0.3 ms
Lead Channel Setting Sensing Sensitivity: 2 mV

## 2021-12-31 ENCOUNTER — Other Ambulatory Visit: Payer: Self-pay

## 2022-01-10 NOTE — Progress Notes (Signed)
?[facility]   ?                                  ?Cardiology Office Note:   ? ?Date:  01/13/2022  ? ?ID:  Sabrina [name], DOB [birth year], MRN [id number] ? ?PCP:  Sharion Balloon, FNP  ?[place] [facility] Cardiologist:  None  ?[place] [facility] Electrophysiologist:  [name], MD  / Dr. Quentin Ore, MD [facility]) ? ?Referring MD: Sharion Balloon, FNP  ? ?Chief Complaint  ?Patient presents with  ? Follow-up  ?  6 month s/p Watchman   ? ?History of Present Illness:   ? ?Sabrina [name] is a 76 y.o. female with a hx of  GI bleed while on anticoagulation, permanent atrial fibrillation, COPD, GERD, HTN, DVT, DVT with IVC filter>>now removed, and sinus node dysfunction s/p PPM who presents today for 6 month follow up s/p Watchman implant.  ?  ?Sabrina [name] was referred to Dr. Quentin Ore due to her intolerance for long term anticoagulation. She initially had an IVC filter in place however this was removed 06/20/21 by Dr. Carlis Abbott. The risks, benefits, and alternatives to left atrial appendage occlusive device placement device were reviewed with the patient and the patient wished to proceed. She was underwent pre procedure CT imaging on 05/15/21 which showed a windsock LAA with landing zone 21.3 by 29 mm suitable for a 27 mm FLX Watchman device. Given this, the patient was felt to have adequate anatomy for LAAO closure device which was scheduled on 08/01/21.  ?  ?She was admitted and underwent LAAO closure with a 24 mm Watchman FLX device by Dr. Quentin Ore. Medication plan is to continue Aspirin '81mg'$  PO daily and Elquis 2.'5mg'$  PO BID x 45 days. She was a no show to her TEE therefore she was set up for follow up CT due to no TEE availability in the registry window. CT performed 09/20/21 with no device leak. She was then transitioned off Eliquis and was started on ASA and Plavix to complete 6 months post implant.  ? ?Today she is here alone and states that she has been doing well since transition. She is having some mild  SOB however feels this is related to her asthma and allergies. She has been taking her inhalers. She denies chest pain, palpitations, LE edema, orthopnea, dizziness, or syncope.  ? ?Past Medical History:  ?Diagnosis Date  ? Anemia   ? Ankle fracture, right   ? past hx. -"no surgery"  ? Anxiety   ? Asthma   ? CHF (congestive heart failure) ([place]) 2009  ? Chronic lower back pain   ? Collagen vascular disease ([place])   ? COPD (chronic obstructive pulmonary disease) ([place])   ? Depression   ? Fibromyalgia   ? GERD (gastroesophageal reflux disease)   ? Hyperlipidemia   ? Hypertension   ? Immature cataract of both eyes   ? Myocardial infarction [facility])   ? "I've had a light one; don't know when it happened" (08/27/2017)  ? Osteoarthritis   ? Peripheral neuropathy   ? legs and feet  ? Persistent atrial fibrillation ([place])   ? Presence of Watchman left atrial appendage closure device 08/01/2021  ? 24 mm Watchman FLXDevice LOT # [id number] by Dr. Quentin Ore  ? Tubular adenoma of colon   ? ? ?Past Surgical History:  ?Procedure Laterality Date  ? APPENDECTOMY    ? AV NODE ABLATION N/A  08/27/2017  ? Procedure: AV NODE ABLATION;  Surgeon: Evans Lance, MD;  Location: [place] [facility];  Service: Cardiovascular;  Laterality: N/A;  ? CHOLECYSTECTOMY OPEN  1978  ? DILATION AND CURETTAGE OF UTERUS    ? FEMUR FRACTURE SURGERY Left 2013  ? "put 7" rod in it"  ? FRACTURE SURGERY    ? IVC FILTER REMOVAL N/A 06/20/2021  ? Procedure: IVC FILTER REMOVAL;  Surgeon: Marty Heck, MD;  Location: [place] [facility];  Service: Cardiovascular;  Laterality: N/A;  ? LAPAROSCOPY  08/22/2016  ? Procedure: LAPAROSCOPY DIAGNOSTIC;  Surgeon: [name], MD;  Location: [facility];  Service: General;;  ? LEFT ATRIAL APPENDAGE OCCLUSION N/A 08/01/2021  ? Procedure: LEFT ATRIAL APPENDAGE OCCLUSION;  Surgeon: Vickie Epley, MD;  Location: [place] [facility];  Service: Cardiovascular;  Laterality: N/A;  ? MEDIAL PARTIAL KNEE REPLACEMENT Right 2005  ? "@ [facility]"   ? PACEMAKER IMPLANT N/A 08/27/2017  ? Procedure: PACEMAKER IMPLANT;  Surgeon: Evans Lance, MD;  Location: [place] [facility];  Service: Cardiovascular;  Laterality: N/A;  ? ROUX-EN-Y GASTRIC BYPASS  2002  ? [place]  ? SPLENECTOMY  2002  ? TEE WITHOUT CARDIOVERSION N/A 08/01/2021  ? Procedure: TRANSESOPHAGEAL ECHOCARDIOGRAM (TEE);  Surgeon: Vickie Epley, MD;  Location: [place] [facility];  Service: Cardiovascular;  Laterality: N/A;  ? TONSILLECTOMY  1944  ? TUBAL LIGATION    ? VAGINAL HYSTERECTOMY    ? ? ?Current Medications: ?Current Meds  ?Medication Sig  ? albuterol (VENTOLIN HFA) 108 (90 Base) MCG/ACT inhaler INHALE 2 PUFFS BY MOUTH EVERY 6 HOURS AS NEEDED FOR WHEEZE OR SHORTNESS OF BREATH  ? alendronate (FOSAMAX) 70 MG tablet TAKE 1 TABLET (70 MG TOTAL) BY MOUTH EVERY 7 (SEVEN) DAYS. TAKE WITH A FULL GLASS OF WATER ON AN EMPTY STOMACH.  ? aspirin EC 81 MG EC tablet Take 1 tablet (81 mg total) by mouth daily. Swallow whole.  ? atorvastatin (LIPITOR) 20 MG tablet Take 1 tablet (20 mg total) by mouth at bedtime.  ? Cholecalciferol (PA VITAMIN D-3) 50 MCG (2000 UT) CAPS Take 2,000 Units by mouth in the morning.  ? clopidogrel (PLAVIX) 75 MG tablet Take 1 tablet (75 mg total) by mouth daily.  ? cromolyn (NASALCROM) 5.2 MG/ACT nasal spray Place 1 spray into both nostrils in the morning and at bedtime.  ? cyanocobalamin (,VITAMIN B-12,) 1000 MCG/ML injection Inject 1,000 mcg into the muscle every 30 (thirty) days.  ? Cyanocobalamin (VITAMIN B-12) 5000 MCG SUBL Place 5,000 mcg under the tongue in the morning.  ? DULoxetine (CYMBALTA) 60 MG capsule TAKE 1 CAPSULE EVERY DAY  ? ferrous sulfate 325 (65 FE) MG tablet Take 325 mg by mouth 3 (three) times a week.  ? fluticasone (FLONASE) 50 MCG/ACT nasal spray SPRAY 2 SPRAYS INTO EACH NOSTRIL EVERY DAY  ? furosemide (LASIX) 40 MG tablet Take 1-1.5 tablets (40-60 mg total) by mouth See admin instructions. Take 1.5 tablets (60 mg) by mouth once  daily in the morning & may take an additional tablet (40 mg) in the evening if needed for fluid retention. (Patient taking differently: Take 80-120 mg by mouth 3 (three) times daily as needed for fluid or edema.)  ? gabapentin (NEURONTIN) 100 MG capsule TAKE 1 CAPSULE TWICE DAILY (NEED MD APPOINTMENT)  ? Multiple Vitamin (MULTIVITAMIN WITH MINERALS) TABS tablet Take 1 tablet by mouth in the morning.  ? Omega-3 Fatty Acids (FISH OIL PO) Take 1  capsule by mouth daily.  ? pantoprazole (PROTONIX) 40 MG tablet TAKE 1 TABLET TWICE DAILY  ? potassium chloride SA (KLOR-CON) 20 MEQ tablet Take 1 tablet (20 mEq total) by mouth daily.  ? SYMBICORT 80-4.5 MCG/ACT inhaler Inhale 2 puffs into the lungs in the morning.  ? triamcinolone ointment (KENALOG) 0.5 % Apply 1 application topically 2 (two) times daily.  ? vitamin C (ASCORBIC ACID) 500 MG tablet Take 500 mg by mouth daily.  ? Vitamin D, Ergocalciferol, (DRISDOL) 1.25 MG (50000 UNIT) CAPS capsule TAKE 1 CAPSULE (50,000 UNITS TOTAL) BY MOUTH EVERY 7 (SEVEN) DAYS.  ?  ? ?Allergies:   Codeine  ? ?Social History  ? ?Socioeconomic History  ? Marital status: Divorced  ?  Spouse name: Not on file  ? Number of children: 3  ? Years of education: 2 years of college  ? Highest education level: Some college, no degree  ?Occupational History  ? Occupation: Retired  ?Tobacco Use  ? Smoking status: Former  ?  Packs/day: 0.50  ?  Years: 25.00  ?  Pack years: 12.50  ?  Types: Cigarettes  ?  Quit date: 02/26/1993  ?  Years since quitting: 28.8  ? Smokeless tobacco: Never  ?Vaping Use  ? Vaping Use: Never used  ?Substance and Sexual Activity  ? Alcohol use: Not Currently  ?  Comment: 08/27/2017 "nothing since early 2000s"  ? Drug use: Not Currently  ? Sexual activity: Not Currently  ?  Birth control/protection: Surgical  ?Other Topics Concern  ? Not on file  ?Social History Narrative  ? ** Merged History Encounter **  ?    ? Pt is right handed ?Lives in single story home with her grandson ?Has 3  adult children ?Associated degree  ?Retired [employer] manager  ? ?Social Determinants of Health  ? ?Financial Resource Strain: Low Risk   ? Difficulty of Paying Living Expenses: Not very hard  ?Food Insecurity: No Food

## 2022-01-13 ENCOUNTER — Ambulatory Visit (INDEPENDENT_AMBULATORY_CARE_PROVIDER_SITE_OTHER): Payer: Medicare HMO | Admitting: Cardiology

## 2022-01-13 ENCOUNTER — Encounter: Payer: Self-pay | Admitting: Emergency Medicine

## 2022-01-13 VITALS — BP 100/70 | HR 86 | Ht 64.0 in | Wt 152.0 lb

## 2022-01-13 DIAGNOSIS — Z95818 Presence of other cardiac implants and grafts: Secondary | ICD-10-CM

## 2022-01-13 DIAGNOSIS — K922 Gastrointestinal hemorrhage, unspecified: Secondary | ICD-10-CM

## 2022-01-13 DIAGNOSIS — I4821 Permanent atrial fibrillation: Secondary | ICD-10-CM

## 2022-01-13 NOTE — Patient Instructions (Addendum)
Medication Instructions:  ?Please discontinue your Plavix Jan 29, 2022. ?Continue all other medications as listed. ? ?*If you need a refill on your cardiac medications before your next appointment, please call your pharmacy* ? ?Lab Work: ?None today ?If you have labs (blood work) drawn today and your tests are completely normal, you will receive your results only by: ?MyChart Message (if you have MyChart) OR ?A paper copy in the mail ?If you have any lab test that is abnormal or we need to change your treatment, we will call you to review the results. ? ?Testing/Procedures: ?None today ? ?Follow-Up: ?At [facility], you and your health needs are our priority.  As part of our continuing mission to provide you with exceptional heart care, we have created designated Provider Care Teams.  These Care Teams include your primary Cardiologist (physician) and Advanced Practice Providers (APPs -  Physician Assistants and Nurse Practitioners) who all work together to provide you with the care you need, when you need it. ? ?We recommend signing up for the patient portal called "MyChart".  Sign up information is provided on this After Visit Summary.  MyChart is used to connect with patients for Virtual Visits (Telemedicine).  Patients are able to view lab/test results, encounter notes, upcoming appointments, etc.  Non-urgent messages can be sent to your provider as well.   ?To learn more about what you can do with MyChart, go to [email or web address].   ? ?Your next appointment:   ?Next available follow up ? ?The format for your next appointment:   ?In Person ? ?Provider:   ?You may see Dr Domenic Polite or the following Advanced Practice Provider on your designated Care Team:   ?Katina Dung, NP  In the [place] office. ? ? ?Thank you for choosing [facility]!! ? ? ? ? ?Important Information About Sugar ? ? ? ? ?  ?

## 2022-01-14 NOTE — Progress Notes (Signed)
Remote pacemaker transmission.   

## 2022-01-24 ENCOUNTER — Ambulatory Visit (INDEPENDENT_AMBULATORY_CARE_PROVIDER_SITE_OTHER): Payer: Medicare HMO | Admitting: Emergency Medicine

## 2022-01-24 DIAGNOSIS — E538 Deficiency of other specified B group vitamins: Secondary | ICD-10-CM

## 2022-01-24 MED ORDER — CYANOCOBALAMIN 1000 MCG/ML IJ SOLN
1000.0000 ug | Freq: Once | INTRAMUSCULAR | Status: AC
  Administered 2022-01-24: 1000 ug via INTRAMUSCULAR

## 2022-01-31 ENCOUNTER — Other Ambulatory Visit: Payer: Self-pay | Admitting: Family

## 2022-01-31 NOTE — Telephone Encounter (Signed)
Is it ok for patient to resume alendronate. Note on rx states to hold.  ?

## 2022-02-03 ENCOUNTER — Other Ambulatory Visit: Payer: Self-pay | Admitting: Family

## 2022-02-03 MED ORDER — ALENDRONATE SODIUM 70 MG PO TABS: 70.0000 mg | ORAL_TABLET | ORAL | 11 refills | Status: AC

## 2022-02-04 DIAGNOSIS — R600 Localized edema: Secondary | ICD-10-CM | POA: Diagnosis not present

## 2022-02-04 DIAGNOSIS — R0789 Other chest pain: Secondary | ICD-10-CM | POA: Diagnosis not present

## 2022-02-04 DIAGNOSIS — I10 Essential (primary) hypertension: Secondary | ICD-10-CM | POA: Diagnosis not present

## 2022-02-04 DIAGNOSIS — K5730 Diverticulosis of large intestine without perforation or abscess without bleeding: Secondary | ICD-10-CM | POA: Diagnosis not present

## 2022-02-04 DIAGNOSIS — K859 Acute pancreatitis without necrosis or infection, unspecified: Secondary | ICD-10-CM | POA: Diagnosis not present

## 2022-02-04 DIAGNOSIS — Z9884 Bariatric surgery status: Secondary | ICD-10-CM | POA: Diagnosis not present

## 2022-02-04 DIAGNOSIS — K6389 Other specified diseases of intestine: Secondary | ICD-10-CM | POA: Diagnosis not present

## 2022-02-04 DIAGNOSIS — K8689 Other specified diseases of pancreas: Secondary | ICD-10-CM | POA: Diagnosis not present

## 2022-02-04 DIAGNOSIS — Z950 Presence of cardiac pacemaker: Secondary | ICD-10-CM | POA: Diagnosis not present

## 2022-02-04 DIAGNOSIS — Z79899 Other long term (current) drug therapy: Secondary | ICD-10-CM | POA: Diagnosis not present

## 2022-02-04 DIAGNOSIS — K3189 Other diseases of stomach and duodenum: Secondary | ICD-10-CM | POA: Diagnosis not present

## 2022-02-04 DIAGNOSIS — R079 Chest pain, unspecified: Secondary | ICD-10-CM | POA: Diagnosis not present

## 2022-02-04 DIAGNOSIS — R1032 Left lower quadrant pain: Secondary | ICD-10-CM | POA: Diagnosis not present

## 2022-02-04 DIAGNOSIS — Z885 Allergy status to narcotic agent status: Secondary | ICD-10-CM | POA: Diagnosis not present

## 2022-02-05 ENCOUNTER — Inpatient Hospital Stay (HOSPITAL_COMMUNITY): Payer: Medicare HMO

## 2022-02-05 ENCOUNTER — Inpatient Hospital Stay (HOSPITAL_COMMUNITY)
Admission: AD | Admit: 2022-02-05 | Discharge: 2022-02-07 | DRG: 287 | Disposition: A | Discharge status: Home / Self Care | Payer: Medicare HMO | Source: Other Acute Inpatient Hospital | Attending: Cardiology | Admitting: Cardiology

## 2022-02-05 DIAGNOSIS — I495 Sick sinus syndrome: Secondary | ICD-10-CM | POA: Diagnosis present

## 2022-02-05 DIAGNOSIS — I2510 Atherosclerotic heart disease of native coronary artery without angina pectoris: Secondary | ICD-10-CM | POA: Diagnosis not present

## 2022-02-05 DIAGNOSIS — K219 Gastro-esophageal reflux disease without esophagitis: Secondary | ICD-10-CM | POA: Diagnosis present

## 2022-02-05 DIAGNOSIS — F32A Depression, unspecified: Secondary | ICD-10-CM | POA: Diagnosis not present

## 2022-02-05 DIAGNOSIS — F419 Anxiety disorder, unspecified: Secondary | ICD-10-CM | POA: Diagnosis present

## 2022-02-05 DIAGNOSIS — Z820 Family history of epilepsy and other diseases of the nervous system: Secondary | ICD-10-CM

## 2022-02-05 DIAGNOSIS — J449 Chronic obstructive pulmonary disease, unspecified: Secondary | ICD-10-CM | POA: Diagnosis present

## 2022-02-05 DIAGNOSIS — I214 Non-ST elevation (NSTEMI) myocardial infarction: Secondary | ICD-10-CM | POA: Diagnosis present

## 2022-02-05 DIAGNOSIS — E785 Hyperlipidemia, unspecified: Secondary | ICD-10-CM | POA: Diagnosis not present

## 2022-02-05 DIAGNOSIS — Z7902 Long term (current) use of antithrombotics/antiplatelets: Secondary | ICD-10-CM

## 2022-02-05 DIAGNOSIS — Z9049 Acquired absence of other specified parts of digestive tract: Secondary | ICD-10-CM

## 2022-02-05 DIAGNOSIS — Z8249 Family history of ischemic heart disease and other diseases of the circulatory system: Secondary | ICD-10-CM

## 2022-02-05 DIAGNOSIS — Z79899 Other long term (current) drug therapy: Secondary | ICD-10-CM

## 2022-02-05 DIAGNOSIS — M545 Low back pain, unspecified: Secondary | ICD-10-CM | POA: Diagnosis present

## 2022-02-05 DIAGNOSIS — R600 Localized edema: Secondary | ICD-10-CM | POA: Diagnosis not present

## 2022-02-05 DIAGNOSIS — G629 Polyneuropathy, unspecified: Secondary | ICD-10-CM | POA: Diagnosis present

## 2022-02-05 DIAGNOSIS — Z90710 Acquired absence of both cervix and uterus: Secondary | ICD-10-CM

## 2022-02-05 DIAGNOSIS — I358 Other nonrheumatic aortic valve disorders: Secondary | ICD-10-CM | POA: Diagnosis present

## 2022-02-05 DIAGNOSIS — I252 Old myocardial infarction: Secondary | ICD-10-CM

## 2022-02-05 DIAGNOSIS — I10 Essential (primary) hypertension: Secondary | ICD-10-CM | POA: Diagnosis not present

## 2022-02-05 DIAGNOSIS — G8929 Other chronic pain: Secondary | ICD-10-CM | POA: Diagnosis present

## 2022-02-05 DIAGNOSIS — I2584 Coronary atherosclerosis due to calcified coronary lesion: Secondary | ICD-10-CM | POA: Diagnosis not present

## 2022-02-05 DIAGNOSIS — I5032 Chronic diastolic (congestive) heart failure: Secondary | ICD-10-CM | POA: Diagnosis present

## 2022-02-05 DIAGNOSIS — R079 Chest pain, unspecified: Secondary | ICD-10-CM | POA: Diagnosis present

## 2022-02-05 DIAGNOSIS — K859 Acute pancreatitis without necrosis or infection, unspecified: Secondary | ICD-10-CM | POA: Diagnosis not present

## 2022-02-05 DIAGNOSIS — Z95818 Presence of other cardiac implants and grafts: Secondary | ICD-10-CM | POA: Diagnosis not present

## 2022-02-05 DIAGNOSIS — I351 Nonrheumatic aortic (valve) insufficiency: Secondary | ICD-10-CM | POA: Diagnosis present

## 2022-02-05 DIAGNOSIS — N179 Acute kidney failure, unspecified: Secondary | ICD-10-CM | POA: Diagnosis present

## 2022-02-05 DIAGNOSIS — I480 Paroxysmal atrial fibrillation: Secondary | ICD-10-CM | POA: Diagnosis not present

## 2022-02-05 DIAGNOSIS — R778 Other specified abnormalities of plasma proteins: Secondary | ICD-10-CM | POA: Diagnosis present

## 2022-02-05 DIAGNOSIS — R072 Precordial pain: Secondary | ICD-10-CM

## 2022-02-05 DIAGNOSIS — Z9081 Acquired absence of spleen: Secondary | ICD-10-CM

## 2022-02-05 DIAGNOSIS — R54 Age-related physical debility: Secondary | ICD-10-CM | POA: Diagnosis present

## 2022-02-05 DIAGNOSIS — I110 Hypertensive heart disease with heart failure: Secondary | ICD-10-CM | POA: Diagnosis present

## 2022-02-05 DIAGNOSIS — Z7983 Long term (current) use of bisphosphonates: Secondary | ICD-10-CM

## 2022-02-05 DIAGNOSIS — Z7982 Long term (current) use of aspirin: Secondary | ICD-10-CM

## 2022-02-05 DIAGNOSIS — Z96651 Presence of right artificial knee joint: Secondary | ICD-10-CM | POA: Diagnosis present

## 2022-02-05 DIAGNOSIS — Z885 Allergy status to narcotic agent status: Secondary | ICD-10-CM | POA: Diagnosis not present

## 2022-02-05 DIAGNOSIS — M797 Fibromyalgia: Secondary | ICD-10-CM | POA: Diagnosis present

## 2022-02-05 DIAGNOSIS — Z86010 Personal history of colonic polyps: Secondary | ICD-10-CM

## 2022-02-05 DIAGNOSIS — R1032 Left lower quadrant pain: Secondary | ICD-10-CM | POA: Diagnosis not present

## 2022-02-05 DIAGNOSIS — Z950 Presence of cardiac pacemaker: Secondary | ICD-10-CM

## 2022-02-05 DIAGNOSIS — Z9884 Bariatric surgery status: Secondary | ICD-10-CM

## 2022-02-05 HISTORY — DX: Tremor, unspecified: R25.1

## 2022-02-05 LAB — TSH: TSH: 1.734 u[IU]/mL (ref 0.350–4.500)

## 2022-02-05 LAB — COMPREHENSIVE METABOLIC PANEL
ALT: 95 U/L — ABNORMAL HIGH (ref 0–44)
AST: 102 U/L — ABNORMAL HIGH (ref 15–41)
Albumin: 2.9 g/dL — ABNORMAL LOW (ref 3.5–5.0)
Alkaline Phosphatase: 163 U/L — ABNORMAL HIGH (ref 38–126)
Anion gap: 3 — ABNORMAL LOW (ref 5–15)
BUN: 14 mg/dL (ref 8–23)
CO2: 28 mmol/L (ref 22–32)
Calcium: 8.4 mg/dL — ABNORMAL LOW (ref 8.9–10.3)
Chloride: 105 mmol/L (ref 98–111)
Creatinine, Ser: 0.89 mg/dL (ref 0.44–1.00)
GFR, Estimated: >60 mL/min (ref >60)
Glucose, Bld: 120 mg/dL — ABNORMAL HIGH (ref 70–99)
Potassium: 3.6 mmol/L (ref 3.5–5.1)
Sodium: 136 mmol/L (ref 135–145)
Total Bilirubin: 1.6 mg/dL — ABNORMAL HIGH (ref 0.3–1.2)
Total Protein: 5.6 g/dL — ABNORMAL LOW (ref 6.5–8.1)

## 2022-02-05 MED ORDER — FLUTICASONE PROPIONATE 50 MCG/ACT NA SUSP: 2.0000 | Freq: Every day | NASAL | Status: DC | PRN

## 2022-02-05 MED ORDER — NITROGLYCERIN 0.4 MG SL SUBL: 0.4000 mg | SUBLINGUAL_TABLET | SUBLINGUAL | Status: DC | PRN

## 2022-02-05 MED ORDER — SODIUM CHLORIDE 0.9 % IV SOLN: INTRAVENOUS | Status: DC

## 2022-02-05 MED ORDER — MOMETASONE FURO-FORMOTEROL FUM 100-5 MCG/ACT IN AERO
2.0000 | INHALATION_SPRAY | Freq: Two times a day (BID) | RESPIRATORY_TRACT | Status: DC
  Administered 2022-02-06 – 2022-02-07 (×3): 2 via RESPIRATORY_TRACT
  Filled 2022-02-05: qty 8.8

## 2022-02-05 MED ORDER — ONDANSETRON HCL 4 MG/2ML IJ SOLN
4.0000 mg | Freq: Four times a day (QID) | INTRAMUSCULAR | Status: DC | PRN
Start: 2022-02-05 — End: 2022-02-07

## 2022-02-05 MED ORDER — ASPIRIN 81 MG PO TBEC
81.0000 mg | DELAYED_RELEASE_TABLET | Freq: Every day | ORAL | Status: DC
  Administered 2022-02-05 – 2022-02-07 (×2): 81 mg via ORAL
  Filled 2022-02-05 (×2): qty 1

## 2022-02-05 MED ORDER — ASPIRIN 81 MG PO CHEW
81.0000 mg | CHEWABLE_TABLET | ORAL | Status: AC
  Administered 2022-02-06: 81 mg via ORAL
  Filled 2022-02-05: qty 1

## 2022-02-05 MED ORDER — SODIUM CHLORIDE 0.9% FLUSH: 3.0000 mL | INTRAVENOUS | Status: DC | PRN

## 2022-02-05 MED ORDER — ATORVASTATIN CALCIUM 10 MG PO TABS
20.0000 mg | ORAL_TABLET | Freq: Every day | ORAL | Status: DC
  Administered 2022-02-05: 20 mg via ORAL
  Filled 2022-02-05: qty 2

## 2022-02-05 MED ORDER — PANTOPRAZOLE SODIUM 40 MG PO TBEC
40.0000 mg | DELAYED_RELEASE_TABLET | Freq: Two times a day (BID) | ORAL | Status: DC
  Administered 2022-02-05 – 2022-02-07 (×4): 40 mg via ORAL
  Filled 2022-02-05 (×4): qty 1

## 2022-02-05 MED ORDER — SODIUM CHLORIDE 0.9 % IV SOLN: 250.0000 mL | INTRAVENOUS | Status: DC | PRN

## 2022-02-05 MED ORDER — ACETAMINOPHEN 325 MG PO TABS: 650.0000 mg | ORAL_TABLET | ORAL | Status: DC | PRN

## 2022-02-05 MED ORDER — DULOXETINE HCL 60 MG PO CPEP
60.0000 mg | ORAL_CAPSULE | Freq: Every day | ORAL | Status: DC
  Administered 2022-02-05 – 2022-02-07 (×3): 60 mg via ORAL
  Filled 2022-02-05 (×3): qty 1

## 2022-02-05 MED ORDER — SODIUM CHLORIDE 0.9% FLUSH
3.0000 mL | Freq: Two times a day (BID) | INTRAVENOUS | Status: DC
  Administered 2022-02-05 – 2022-02-06 (×3): 3 mL via INTRAVENOUS

## 2022-02-05 MED ORDER — ALBUTEROL SULFATE (2.5 MG/3ML) 0.083% IN NEBU: 3.0000 mL | INHALATION_SOLUTION | RESPIRATORY_TRACT | Status: DC | PRN

## 2022-02-05 MED ORDER — GABAPENTIN 100 MG PO CAPS
100.0000 mg | ORAL_CAPSULE | Freq: Every day | ORAL | Status: DC
  Administered 2022-02-05 – 2022-02-06 (×2): 100 mg via ORAL
  Filled 2022-02-05 (×2): qty 1

## 2022-02-05 MED ORDER — ALENDRONATE SODIUM 70 MG PO TABS
70.0000 mg | ORAL_TABLET | ORAL | Status: DC
  Filled 2022-02-05: qty 1

## 2022-02-05 NOTE — Progress Notes (Signed)
Progress Note  Patient Name: Sabrina [name] Date of Encounter: 02/06/2022  [facility] Cardiologist: None   Subjective   Cath with coronary calcification but no obstructive disease.   Currently doing well. Excited to resume biking once able.   Inpatient Medications    Scheduled Meds:  [MAR Hold] aspirin EC  81 mg Oral Daily   [MAR Hold] atorvastatin  20 mg Oral QHS   [MAR Hold] DULoxetine  60 mg Oral Daily   [MAR Hold] gabapentin  100 mg Oral QHS   [MAR Hold] mometasone-formoterol  2 puff Inhalation BID   [MAR Hold] pantoprazole  40 mg Oral BID   [MAR Hold] sodium chloride flush  3 mL Intravenous Q12H   Continuous Infusions:  sodium chloride     sodium chloride 10 mL/hr at 02/06/22 0556   PRN Meds: sodium chloride, [MAR Hold] acetaminophen, [MAR Hold] albuterol, [MAR Hold] fluticasone, [MAR Hold] nitroGLYCERIN, [MAR Hold] ondansetron (ZOFRAN) IV, sodium chloride flush   Vital Signs    Vitals:   02/05/22 1538 02/05/22 1958 02/06/22 0049 02/06/22 0445  BP: 114/72 (!) 100/56 (!) 93/58 93/60  Pulse: 73 72 74 71  Resp: '16 17 15 16  '$ Temp: 97.8 F (36.6 C) 98.3 F (36.8 C) 98.1 F (36.7 C) 97.7 F (36.5 C)  TempSrc: Oral Oral Oral Oral  SpO2: 100% 96% 98% 97%  Weight: 68.2 kg   68 kg  Height: '5\' 4"'$  (1.626 m)       Intake/Output Summary (Last 24 hours) at 02/06/2022 0751 Last data filed at 02/05/2022 2201 Gross per 24 hour  Intake 480 ml  Output --  Net 480 ml      02/06/2022    4:45 AM 02/05/2022    3:38 PM 01/13/2022   11:28 AM  Last 3 Weights  Weight (lbs) 149 lb 14.6 oz 150 lb 4.8 oz 152 lb  Weight (kg) 68 kg 68.176 kg 68.947 kg      Telemetry    Afib with v-pacing - Personally Reviewed  ECG    V-paced - Personally Reviewed  Physical Exam   GEN: No acute distress.   Neck: No JVD Cardiac: RRR, no murmurs, rubs, or gallops.  Respiratory: Clear to auscultation bilaterally. GI: Soft, nontender, non-distended  MS: No edema; No deformity.  Right radial access site c/d/i Neuro:  Nonfocal  Psych: Normal affect   Labs    High Sensitivity Troponin:  No results for input(s): TROPONINIHS in the last 720 hours.   Chemistry Recent Labs  Lab 02/05/22 1754 02/06/22 0251  NA 136 139  K 3.6 3.7  CL 105 105  CO2 28 29  GLUCOSE 120* 99  BUN 14 14  CREATININE 0.89 0.86  CALCIUM 8.4* 8.4*  PROT 5.6*  --   ALBUMIN 2.9*  --   AST 102*  --   ALT 95*  --   ALKPHOS 163*  --   BILITOT 1.6*  --   GFRNONAA >60 >60  ANIONGAP 3* 5    Lipids  Recent Labs  Lab 02/06/22 0251  CHOL 119  TRIG 69  HDL 54  LDLCALC 51  CHOLHDL 2.2    Hematology Recent Labs  Lab 02/06/22 0251  WBC 6.4  RBC 3.65*  HGB 10.2*  HCT 32.0*  MCV 87.7  MCH 27.9  MCHC 31.9  RDW 15.1  PLT 283   Thyroid  Recent Labs  Lab 02/05/22 1754  TSH 1.734    BNPNo results for input(s): BNP, PROBNP in the  last 168 hours.  DDimer No results for input(s): DDIMER in the last 168 hours.   Radiology    DG CHEST PORT 1 VIEW  Result Date: 02/05/2022 CLINICAL DATA:  Chest pain. EXAM: PORTABLE CHEST 1 VIEW COMPARISON:  Chest radiograph dated 02/04/2022. FINDINGS: No focal consolidation, pleural effusion, or pneumothorax. Borderline cardiomegaly. Left atrial appendage occlusive device and left pectoral pacemaker. Atherosclerotic calcification of the aortic arch. No acute osseous pathology. Multiple surgical clips in the upper abdomen. IMPRESSION: No acute cardiopulmonary process. Electronically Signed   By: Anner Crete M.D.   On: 02/05/2022 17:26    Cardiac Studies   Cath 02/06/22:   Prox LAD lesion is 20% stenosed.   Mid LAD-1 lesion is 20% stenosed.   Mid LAD-2 lesion is 20% stenosed.   1st Mrg lesion is 20% stenosed.   Prox RCA lesion is 20% stenosed.   The left ventricular systolic function is normal.   LV end diastolic pressure is normal.   The left ventricular ejection fraction is 55-65% by visual estimate.   Three-vessel coronary calcification  with mild nonobstructive CAD with a dominant RCA.   Normal LV function with EF estimated at 55 to 65%; LVEDP 12 mmHg.   RECOMMENDATION: Medical therapy for multivessel nonobstructive CAD.  Aspirin for antiplatelet benefit.  Aggressive lipid-lowering therapy with target LDL less than 70.  Patient Profile     76 y.o. female with history of permanent Afib s/p AVN ablation and PPM placement, COPD, HTN, DVT with IVC filter (now removed), GIB on [facility] s/p Watchman, and multivessel coronary calcification on CT who presented to [facility] with chest pain found to have elevated troponin prompting transfer to [facility].  Assessment & Plan    #Chest Pain: #Elevated Troponin: Patient presented to [facility] with episode of severe, substernal chest pressure with associated nausea and diaphoresis after having a bowel movement. Previously had some mild chest discomfort with exertion intermittently that resolved with rest. At [facility]  hsTn 200>>311>>309>>361>>320>>134. CT chest with multivessel coronary calcification. Cath without obstructive disease.  -Cath without obstructive disease; has heavy coronary calcificaiton -TTE pending -Continue ASA '81mg'$  daily, lipitor '20mg'$  daily  #Chronic Diastolic HF: TTE 11/7046 with LVEF 60-65%, no significant valve disease, diastolic function indeterminate. Takes lasix as needed at home. BNP 900s at OSH. Did not appear acutely decompensated from HF standpoint.  -Follow-up TTE -Cannot add spiro due to soft blood pressures -Monitor I/Os and daily weights  #Permanent Afib s/p AVN ablation with PPM placement: Normal device funtion on 12/27/21. Remains Vpaced on the monitor. -Follow-up with Dr. Lovena Le as scheduled  #History of GIB on [facility]: #S/p Watchman: S/p Watchman procedure. Not on [facility] currently  Plan for discharge either later today vs tomorrow AM.     For questions or updates, please contact [place] Please consult [email or web address] for contact info under         Signed, Freada Bergeron, MD  02/06/2022, 7:51 AM

## 2022-02-05 NOTE — H&P (Addendum)
?Cardiology Admission History and Physical:  ? ?Patient ID: Sabrina [name] ?MRN: [id number]; DOB: 09/14/1946  ? ?Admission date: 02/05/2022 ? ?PCP:  Sharion Balloon, FNP ?  ?[facility] Providers ?Cardiologist:  None  ?Electrophysiologist:  [name], MD   ? ? ?Chief Complaint:  Chest pain, NSTEMI  ? ?Patient Profile:  ? ?Sabrina [name] is a 76 y.o. female with a history of GI bleed while on anticoagulation, permanent atrial fibrillation, COPD, GERD, HTN, DVT with IVC filter (now removed), sinus node dysfunction s/p PPM who is being seen 02/05/2022 for the evaluation of chest pain. ? ?History of Present Illness:  ? ?[name] is a 76 year old female with above medical history who is followed by Dr. Lovena Le.  Per chart review, patient was seen by Dr. Lovena Le in January 2016 with complaints of chest pain, shortness of breath.  Patient underwent a nuclear stress test that was negative.  Later, in 2017, patient was noted to have paroxysmal atrial fibrillation.  Heart rate was difficult to control with medications, and patient underwent AV node ablation and implantation of a Medtronic dual-chamber pacemaker on 08/27/2017.  In 01/31/2021, patient had a significant GI bleed and her anticoagulation was stopped.  Patient had an IVC filter placed.  IVC filter was removed on 06/20/2021 and patient was referred to Dr. Lysbeth Galas for possible Watchman procedure.  Patient had a Watchman device placed on 08/01/2021.  Patient had a cardiac CT on 09/20/2021 that did show three-vessel coronary calcifications noted, however calcium score was not able to be performed due to interference from left atrial appendage closer device.  ? ?Patient was last seen by cardiology on 01/13/2022.  At that appointment, patient reported that she was doing well.  Did complain of some mild shortness of breath but her breathing improved with use of her inhalers.  Patient denies any chest pain, palpitations, lower extremity edema, orthopnea,  dizziness, syncope. ? ?Patient presented to the [facility] emergency department on 02/04/2022 complaining of chest pain, burping.  Patient reported that she was walking when she noticed her chest pain. hsTn 200>>311>>309>>361>>320>>134. BNP elevated to 998.0. CXR showed cardiomegaly and vascular congestion. Creatinine 1.14. Patient was transported to [facility] for further evaluation.  ? ?Patient reports that she has been having issues with indigestion for the past few months.  Over this past weekend, she went out of town and forgot to take her medicines.  Forgot her Lasix at home, noticed some ankle swelling and shortness of breath over the weekend.  Also noted she did not have a bowel movement over the weekend, had a bowel movement upon returning home on Monday.  Reports it was the largest bowel movement she has ever had.  Immediately after having bowel movement, patient reports having chest pain that felt like pressure, tightness.  Pain did not radiate and was located in the center of her chest.  Denied any shortness of breath.  Patient tried to stand up from the toilet, however felt extremely weak.  Crawled to her kitchen floor and laid there for an hour and a half before her grandson came home.  Also had nausea and vomiting. Pain was relieved with morphine in the emergency department.  Her chest wall is tender to palpation. ? ?Past Medical History:  ?Diagnosis Date  ? Anemia   ? Ankle fracture, right   ? past hx. -"no surgery"  ? Anxiety   ? Asthma   ? CHF (congestive heart failure) ([place]) 2009  ? Chronic lower back pain   ?  Collagen vascular disease ([place])   ? COPD (chronic obstructive pulmonary disease) ([place])   ? Depression   ? Fibromyalgia   ? GERD (gastroesophageal reflux disease)   ? Hyperlipidemia   ? Hypertension   ? Immature cataract of both eyes   ? Myocardial infarction [facility])   ? "I've had a light one; don't know when it happened" (08/27/2017)  ? Osteoarthritis   ? Peripheral neuropathy   ? legs and  feet  ? Persistent atrial fibrillation ([place])   ? Presence of Watchman left atrial appendage closure device 08/01/2021  ? 24 mm Watchman FLXDevice LOT # [id number] by Dr. Quentin Ore  ? Tubular adenoma of colon   ? ? ?Past Surgical History:  ?Procedure Laterality Date  ? APPENDECTOMY    ? AV NODE ABLATION N/A 08/27/2017  ? Procedure: AV NODE ABLATION;  Surgeon: Evans Lance, MD;  Location: [place] [facility];  Service: Cardiovascular;  Laterality: N/A;  ? CHOLECYSTECTOMY OPEN  1978  ? DILATION AND CURETTAGE OF UTERUS    ? FEMUR FRACTURE SURGERY Left 2013  ? "put 7" rod in it"  ? FRACTURE SURGERY    ? IVC FILTER REMOVAL N/A 06/20/2021  ? Procedure: IVC FILTER REMOVAL;  Surgeon: Marty Heck, MD;  Location: [place] [facility];  Service: Cardiovascular;  Laterality: N/A;  ? LAPAROSCOPY  08/22/2016  ? Procedure: LAPAROSCOPY DIAGNOSTIC;  Surgeon: [name], MD;  Location: [facility];  Service: General;;  ? LEFT ATRIAL APPENDAGE OCCLUSION N/A 08/01/2021  ? Procedure: LEFT ATRIAL APPENDAGE OCCLUSION;  Surgeon: Vickie Epley, MD;  Location: [place] [facility];  Service: Cardiovascular;  Laterality: N/A;  ? MEDIAL PARTIAL KNEE REPLACEMENT Right 2005  ? "@ [employer]"  ? PACEMAKER IMPLANT N/A 08/27/2017  ? Procedure: PACEMAKER IMPLANT;  Surgeon: Evans Lance, MD;  Location: [place] [facility];  Service: Cardiovascular;  Laterality: N/A;  ? ROUX-EN-Y GASTRIC BYPASS  2002  ? [place]  ? SPLENECTOMY  2002  ? TEE WITHOUT CARDIOVERSION N/A 08/01/2021  ? Procedure: TRANSESOPHAGEAL ECHOCARDIOGRAM (TEE);  Surgeon: Vickie Epley, MD;  Location: [place] [facility];  Service: Cardiovascular;  Laterality: N/A;  ? TONSILLECTOMY  1944  ? TUBAL LIGATION    ? VAGINAL HYSTERECTOMY    ?  ? ?Medications Prior to Admission: ?Prior to Admission medications   ?Medication Sig Start Date End Date Taking? Authorizing Provider  ?albuterol (VENTOLIN HFA) 108 (90 Base) MCG/ACT inhaler INHALE 2 PUFFS BY MOUTH EVERY 6 HOURS AS  NEEDED FOR WHEEZE OR SHORTNESS OF BREATH 08/23/21   Sharion Balloon, FNP  ?alendronate (FOSAMAX) 70 MG tablet Take 1 tablet (70 mg total) by mouth every 7 (seven) days. Take with a full glass of water on an empty stomach. 02/03/22 02/03/23  Sharion Balloon, FNP  ?aspirin EC 81 MG EC tablet Take 1 tablet (81 mg total) by mouth daily. Swallow whole. 08/02/21   Kathyrn Drown D, NP  ?atorvastatin (LIPITOR) 20 MG tablet Take 1 tablet (20 mg total) by mouth at bedtime. 09/02/21   Sharion Balloon, FNP  ?Cholecalciferol (PA VITAMIN D-3) 50 MCG (2000 UT) CAPS Take 2,000 Units by mouth in the morning.    [provider]  ?clopidogrel (PLAVIX) 75 MG tablet Take 1 tablet (75 mg total) by mouth daily. 09/24/21 03/23/22  Kathyrn Drown D, NP  ?cromolyn (NASALCROM) 5.2 MG/ACT nasal spray Place 1 spray into both nostrils in the morning and at bedtime.    [provider]  ?cyanocobalamin (,VITAMIN B-12,) 1000 MCG/ML injection Inject 1,000 mcg into the muscle every 30 (thirty) days.    [provider]  ?Cyanocobalamin (VITAMIN B-12) 5000 MCG SUBL Place 5,000 mcg under the tongue in the morning.    [provider]  ?DULoxetine (CYMBALTA) 60 MG capsule TAKE 1 CAPSULE EVERY DAY 06/14/21   Sharion Balloon, FNP  ?ferrous sulfate 325 (65 FE) MG tablet Take 325 mg by mouth 3 (three) times a week.    [provider]  ?fluticasone (FLONASE) 50 MCG/ACT nasal spray SPRAY 2 SPRAYS INTO EACH NOSTRIL EVERY DAY 09/02/21   Sharion Balloon, FNP  ?furosemide (LASIX) 40 MG tablet Take 1-1.5 tablets (40-60 mg total) by mouth See admin instructions. Take 1.5 tablets (60 mg) by mouth once daily in the morning & may take an additional tablet (40 mg) in the evening if needed for fluid retention. ?Patient taking differently: Take 80-120 mg by mouth 3 (three) times daily as needed for fluid or edema. 07/03/21   Tommie Raymond, NP  ?gabapentin (NEURONTIN) 100 MG capsule TAKE 1 CAPSULE TWICE DAILY (NEED MD  APPOINTMENT) 08/23/21   Sharion Balloon, FNP  ?Multiple Vitamin (MULTIVITAMIN WITH MINERALS) TABS tablet Take 1 tablet by mouth in the morning.    [provider]  ?Omega-3 Fatty Acids (FISH OIL PO) Take 1 capsule

## 2022-02-06 ENCOUNTER — Inpatient Hospital Stay (HOSPITAL_COMMUNITY): Payer: Medicare HMO

## 2022-02-06 ENCOUNTER — Encounter (HOSPITAL_COMMUNITY): Payer: Self-pay | Admitting: Cardiovascular Disease

## 2022-02-06 ENCOUNTER — Encounter (HOSPITAL_COMMUNITY)
Admission: AD | Disposition: A | Discharge status: Home / Self Care | Payer: Self-pay | Source: Other Acute Inpatient Hospital | Attending: Cardiology

## 2022-02-06 DIAGNOSIS — I5032 Chronic diastolic (congestive) heart failure: Secondary | ICD-10-CM | POA: Diagnosis not present

## 2022-02-06 DIAGNOSIS — R079 Chest pain, unspecified: Secondary | ICD-10-CM | POA: Diagnosis not present

## 2022-02-06 DIAGNOSIS — R072 Precordial pain: Secondary | ICD-10-CM | POA: Diagnosis not present

## 2022-02-06 DIAGNOSIS — I2510 Atherosclerotic heart disease of native coronary artery without angina pectoris: Secondary | ICD-10-CM | POA: Diagnosis not present

## 2022-02-06 DIAGNOSIS — I2584 Coronary atherosclerosis due to calcified coronary lesion: Secondary | ICD-10-CM

## 2022-02-06 HISTORY — PX: LEFT HEART CATH AND CORONARY ANGIOGRAPHY: CATH118249

## 2022-02-06 LAB — LIPID PANEL
Cholesterol: 119 mg/dL (ref 0–200)
HDL: 54 mg/dL (ref >40)
LDL Cholesterol: 51 mg/dL (ref 0–99)
Total CHOL/HDL Ratio: 2.2 RATIO
Triglycerides: 69 mg/dL (ref <150)
VLDL: 14 mg/dL (ref 0–40)

## 2022-02-06 LAB — CBC
HCT: 32 % — ABNORMAL LOW (ref 36.0–46.0)
Hemoglobin: 10.2 g/dL — ABNORMAL LOW (ref 12.0–15.0)
MCH: 27.9 pg (ref 26.0–34.0)
MCHC: 31.9 g/dL (ref 30.0–36.0)
MCV: 87.7 fL (ref 80.0–100.0)
Platelets: 283 10*3/uL (ref 150–400)
RBC: 3.65 MIL/uL — ABNORMAL LOW (ref 3.87–5.11)
RDW: 15.1 % (ref 11.5–15.5)
WBC: 6.4 10*3/uL (ref 4.0–10.5)
nRBC: 0 % (ref 0.0–0.2)

## 2022-02-06 LAB — BASIC METABOLIC PANEL
Anion gap: 5 (ref 5–15)
BUN: 14 mg/dL (ref 8–23)
CO2: 29 mmol/L (ref 22–32)
Calcium: 8.4 mg/dL — ABNORMAL LOW (ref 8.9–10.3)
Chloride: 105 mmol/L (ref 98–111)
Creatinine, Ser: 0.86 mg/dL (ref 0.44–1.00)
GFR, Estimated: >60 mL/min (ref >60)
Glucose, Bld: 99 mg/dL (ref 70–99)
Potassium: 3.7 mmol/L (ref 3.5–5.1)
Sodium: 139 mmol/L (ref 135–145)

## 2022-02-06 LAB — ECHOCARDIOGRAM COMPLETE
Area-P 1/2: 3.77 cm2
Calc EF: 67.6 %
Height: 64 in
P 1/2 time: 503 msec
S' Lateral: 2.5 cm
Single Plane A2C EF: 71.8 %
Single Plane A4C EF: 61.5 %
Weight: 2398.6 oz

## 2022-02-06 SURGERY — LEFT HEART CATH AND CORONARY ANGIOGRAPHY
Anesthesia: LOCAL

## 2022-02-06 MED ORDER — ACETAMINOPHEN 325 MG PO TABS: 650.0000 mg | ORAL_TABLET | ORAL | Status: DC | PRN

## 2022-02-06 MED ORDER — MIDAZOLAM HCL 2 MG/2ML IJ SOLN
INTRAMUSCULAR | Status: DC | PRN
  Administered 2022-02-06: 1 mg via INTRAVENOUS

## 2022-02-06 MED ORDER — VERAPAMIL HCL 2.5 MG/ML IV SOLN
INTRAVENOUS | Status: DC | PRN
  Administered 2022-02-06: 10 mL via INTRA_ARTERIAL

## 2022-02-06 MED ORDER — HEPARIN SODIUM (PORCINE) 1000 UNIT/ML IJ SOLN
INTRAMUSCULAR | Status: AC
  Filled 2022-02-06: qty 10

## 2022-02-06 MED ORDER — SODIUM CHLORIDE 0.9 % IV SOLN: INTRAVENOUS | Status: AC

## 2022-02-06 MED ORDER — ONDANSETRON HCL 4 MG/2ML IJ SOLN: 4.0000 mg | Freq: Four times a day (QID) | INTRAMUSCULAR | Status: DC | PRN

## 2022-02-06 MED ORDER — FENTANYL CITRATE (PF) 100 MCG/2ML IJ SOLN
INTRAMUSCULAR | Status: AC
Start: 2022-02-06 — End: ?
  Filled 2022-02-06: qty 2

## 2022-02-06 MED ORDER — HEPARIN (PORCINE) IN NACL 1000-0.9 UT/500ML-% IV SOLN
INTRAVENOUS | Status: DC | PRN
  Administered 2022-02-06 (×2): 500 mL

## 2022-02-06 MED ORDER — ASPIRIN 81 MG PO CHEW: 81.0000 mg | CHEWABLE_TABLET | Freq: Every day | ORAL | Status: DC

## 2022-02-06 MED ORDER — MIDAZOLAM HCL 2 MG/2ML IJ SOLN
INTRAMUSCULAR | Status: AC
  Filled 2022-02-06: qty 2

## 2022-02-06 MED ORDER — ATORVASTATIN CALCIUM 40 MG PO TABS
40.0000 mg | ORAL_TABLET | Freq: Every day | ORAL | Status: DC
  Administered 2022-02-06 – 2022-02-07 (×2): 40 mg via ORAL
  Filled 2022-02-06 (×2): qty 1

## 2022-02-06 MED ORDER — HEPARIN (PORCINE) IN NACL 1000-0.9 UT/500ML-% IV SOLN
INTRAVENOUS | Status: AC
  Filled 2022-02-06: qty 1000

## 2022-02-06 MED ORDER — SODIUM CHLORIDE 0.9% FLUSH
3.0000 mL | Freq: Two times a day (BID) | INTRAVENOUS | Status: DC
  Administered 2022-02-06 (×2): 3 mL via INTRAVENOUS

## 2022-02-06 MED ORDER — LIDOCAINE HCL (PF) 1 % IJ SOLN
INTRAMUSCULAR | Status: DC | PRN
  Administered 2022-02-06: 2 mL

## 2022-02-06 MED ORDER — LABETALOL HCL 5 MG/ML IV SOLN: 10.0000 mg | INTRAVENOUS | Status: AC | PRN

## 2022-02-06 MED ORDER — IOHEXOL 350 MG/ML SOLN
INTRAVENOUS | Status: DC | PRN
  Administered 2022-02-06: 40 mL via INTRA_ARTERIAL

## 2022-02-06 MED ORDER — SODIUM CHLORIDE 0.9% FLUSH: 3.0000 mL | INTRAVENOUS | Status: DC | PRN

## 2022-02-06 MED ORDER — FENTANYL CITRATE (PF) 100 MCG/2ML IJ SOLN
INTRAMUSCULAR | Status: DC | PRN
  Administered 2022-02-06: 25 ug via INTRAVENOUS

## 2022-02-06 MED ORDER — HEPARIN SODIUM (PORCINE) 1000 UNIT/ML IJ SOLN
INTRAMUSCULAR | Status: DC | PRN
  Administered 2022-02-06: 3500 [IU] via INTRAVENOUS

## 2022-02-06 MED ORDER — SODIUM CHLORIDE 0.9 % IV SOLN: 250.0000 mL | INTRAVENOUS | Status: DC | PRN

## 2022-02-06 MED ORDER — LIDOCAINE HCL (PF) 1 % IJ SOLN
INTRAMUSCULAR | Status: AC
  Filled 2022-02-06: qty 30

## 2022-02-06 MED ORDER — VERAPAMIL HCL 2.5 MG/ML IV SOLN
INTRAVENOUS | Status: AC
  Filled 2022-02-06: qty 2

## 2022-02-06 MED ORDER — HYDRALAZINE HCL 20 MG/ML IJ SOLN: 10.0000 mg | INTRAMUSCULAR | Status: AC | PRN

## 2022-02-06 SURGICAL SUPPLY — 12 items
CATH INFINITI JR4 5F (CATHETERS) ×1 IMPLANT
CATH OPTITORQUE TIG 4.0 5F (CATHETERS) ×1 IMPLANT
DEVICE RAD COMP TR BAND LRG (VASCULAR PRODUCTS) ×1 IMPLANT
GLIDESHEATH SLEND SS 6F .021 (SHEATH) ×1 IMPLANT
GUIDEWIRE INQWIRE 1.5J.035X260 (WIRE) IMPLANT
INQWIRE 1.5J .035X260CM (WIRE) ×2
KIT ENCORE 26 ADVANTAGE (KITS) IMPLANT
KIT HEART LEFT (KITS) ×2 IMPLANT
PACK CARDIAC CATHETERIZATION (CUSTOM PROCEDURE TRAY) ×2 IMPLANT
SHEATH PROBE COVER 6X72 (BAG) ×1 IMPLANT
TRANSDUCER W/STOPCOCK (MISCELLANEOUS) ×2 IMPLANT
TUBING CIL FLEX 10 FLL-RA (TUBING) ×2 IMPLANT

## 2022-02-06 NOTE — Progress Notes (Signed)
  Echocardiogram 2D Echocardiogram has been performed.  Sabrina [name] 02/06/2022, 10:29 AM

## 2022-02-06 NOTE — Progress Notes (Signed)
Progress Note  Patient Name: Sabrina [name] Date of Encounter: 02/06/2022  [facility] Cardiologist: None   Subjective   Doing well. Ready to go home.  Inpatient Medications    Scheduled Meds:  aspirin EC  81 mg Oral Daily   atorvastatin  40 mg Oral Daily   DULoxetine  60 mg Oral Daily   gabapentin  100 mg Oral QHS   mometasone-formoterol  2 puff Inhalation BID   pantoprazole  40 mg Oral BID   sodium chloride flush  3 mL Intravenous Q12H   sodium chloride flush  3 mL Intravenous Q12H   Continuous Infusions:  sodium chloride     PRN Meds: sodium chloride, acetaminophen, albuterol, fluticasone, nitroGLYCERIN, ondansetron (ZOFRAN) IV, sodium chloride flush   Vital Signs    Vitals:   02/06/22 1247 02/06/22 1454 02/06/22 1958 02/06/22 2003  BP:  (!) 101/58  108/62  Pulse:      Resp:  17  18  Temp:    98.4 F (36.9 C)  TempSrc:    Oral  SpO2: 100% 100% 98% 97%  Weight:      Height:        Intake/Output Summary (Last 24 hours) at 02/06/2022 2046 Last data filed at 02/06/2022 1900 Gross per 24 hour  Intake 1249.32 ml  Output --  Net 1249.32 ml       02/06/2022    4:45 AM 02/05/2022    3:38 PM 01/13/2022   11:28 AM  Last 3 Weights  Weight (lbs) 149 lb 14.6 oz 150 lb 4.8 oz 152 lb  Weight (kg) 68 kg 68.176 kg 68.947 kg      Telemetry    Afib; v-paced- Personally Reviewed  ECG    V-paced - Personally Reviewed  Physical Exam   GEN: No acute distress.   Neck: No JVD Cardiac: RR, no murmurs, rubs, or gallops.  Respiratory: Clear to auscultation bilaterally. GI: Soft, nontender, non-distended  MS: No edema; No deformity. Right radial access site c/d/i Neuro:  Nonfocal  Psych: Normal affect   Labs    High Sensitivity Troponin:  No results for input(s): TROPONINIHS in the last 720 hours.   Chemistry Recent Labs  Lab 02/05/22 1754 02/06/22 0251  NA 136 139  K 3.6 3.7  CL 105 105  CO2 28 29  GLUCOSE 120* 99  BUN 14 14  CREATININE 0.89  0.86  CALCIUM 8.4* 8.4*  PROT 5.6*  --   ALBUMIN 2.9*  --   AST 102*  --   ALT 95*  --   ALKPHOS 163*  --   BILITOT 1.6*  --   GFRNONAA >60 >60  ANIONGAP 3* 5     Lipids  Recent Labs  Lab 02/06/22 0251  CHOL 119  TRIG 69  HDL 54  LDLCALC 51  CHOLHDL 2.2     Hematology Recent Labs  Lab 02/06/22 0251  WBC 6.4  RBC 3.65*  HGB 10.2*  HCT 32.0*  MCV 87.7  MCH 27.9  MCHC 31.9  RDW 15.1  PLT 283    Thyroid  Recent Labs  Lab 02/05/22 1754  TSH 1.734     BNPNo results for input(s): BNP, PROBNP in the last 168 hours.  DDimer No results for input(s): DDIMER in the last 168 hours.   Radiology    CARDIAC CATHETERIZATION  Result Date: 02/06/2022   Prox LAD lesion is 20% stenosed.   Mid LAD-1 lesion is 20% stenosed.   Mid LAD-2 lesion is  20% stenosed.   1st Mrg lesion is 20% stenosed.   Prox RCA lesion is 20% stenosed.   The left ventricular systolic function is normal.   LV end diastolic pressure is normal.   The left ventricular ejection fraction is 55-65% by visual estimate. Three-vessel coronary calcification with mild nonobstructive CAD with a dominant RCA. Normal LV function with EF estimated at 55 to 65%; LVEDP 12 mmHg. RECOMMENDATION: Medical therapy for multivessel nonobstructive CAD.  Aspirin for antiplatelet benefit.  Aggressive lipid-lowering therapy with target LDL less than 70.   DG CHEST PORT 1 VIEW  Result Date: 02/05/2022 CLINICAL DATA:  Chest pain. EXAM: PORTABLE CHEST 1 VIEW COMPARISON:  Chest radiograph dated 02/04/2022. FINDINGS: No focal consolidation, pleural effusion, or pneumothorax. Borderline cardiomegaly. Left atrial appendage occlusive device and left pectoral pacemaker. Atherosclerotic calcification of the aortic arch. No acute osseous pathology. Multiple surgical clips in the upper abdomen. IMPRESSION: No acute cardiopulmonary process. Electronically Signed   By: Anner Crete M.D.   On: 02/05/2022 17:26   ECHOCARDIOGRAM COMPLETE  Result  Date: 02/06/2022    ECHOCARDIOGRAM REPORT   Patient Name:   Sabrina [name] Date of Exam: 02/06/2022 Medical Rec #:  [id number]          Height:       64.0 in Accession #:    [id number]         Weight:       149.9 lb Date of Birth:  19-Nov-1945         BSA:          1.731 m Patient Age:    76 years           BP:           93/60 mmHg Patient Gender: F                  HR:           70 bpm. Exam Location:  Inpatient Procedure: 2D Echo, Cardiac Doppler and Color Doppler Indications:    R07.9* Chest pain, unspecified  History:        Patient has prior history of Echocardiogram examinations, most                 recent 08/01/2021. CHF, Previous Myocardial Infarction, COPD,                 Arrythmias:Atrial Fibrillation; Risk Factors:Hypertension and                 Dyslipidemia.  Sonographer:    Bernadene Person RDCS Referring Phys: [id number] [place]  1. Left ventricular ejection fraction, by estimation, is 60 to 65%. The left ventricle has normal function. The left ventricle has no regional wall motion abnormalities. Left ventricular diastolic parameters are indeterminate.  2. Pacing wires in RA/RV . Right ventricular systolic function is normal. The right ventricular size is normal. There is normal pulmonary artery systolic pressure.  3. Left atrial size was moderately dilated.  4. Right atrial size was mildly dilated.  5. The mitral valve is abnormal. Mild mitral valve regurgitation. No evidence of mitral stenosis.  6. The aortic valve is tricuspid. There is mild calcification of the aortic valve. Aortic valve regurgitation is mild. Aortic valve sclerosis is present, with no evidence of aortic valve stenosis.  7. The inferior vena cava is normal in size with greater than 50% respiratory variability, suggesting right atrial pressure of 3 mmHg. FINDINGS  Left Ventricle: Left  ventricular ejection fraction, by estimation, is 60 to 65%. The left ventricle has normal function. The left ventricle has no  regional wall motion abnormalities. The left ventricular internal cavity size was normal in size. There is  no left ventricular hypertrophy. Left ventricular diastolic parameters are indeterminate. Right Ventricle: Pacing wires in RA/RV. The right ventricular size is normal. No increase in right ventricular wall thickness. Right ventricular systolic function is normal. There is normal pulmonary artery systolic pressure. The tricuspid regurgitant velocity is 2.21 m/s, and with an assumed right atrial pressure of 3 mmHg, the estimated right ventricular systolic pressure is 17.4 mmHg. Left Atrium: Left atrial size was moderately dilated. Right Atrium: Right atrial size was mildly dilated. Pericardium: There is no evidence of pericardial effusion. Mitral Valve: The mitral valve is abnormal. There is mild thickening of the mitral valve leaflet(s). There is mild calcification of the mitral valve leaflet(s). Mild mitral valve regurgitation. No evidence of mitral valve stenosis. Tricuspid Valve: The tricuspid valve is normal in structure. Tricuspid valve regurgitation is mild . No evidence of tricuspid stenosis. Aortic Valve: The aortic valve is tricuspid. There is mild calcification of the aortic valve. Aortic valve regurgitation is mild. Aortic regurgitation PHT measures 503 msec. Aortic valve sclerosis is present, with no evidence of aortic valve stenosis. Pulmonic Valve: The pulmonic valve was normal in structure. Pulmonic valve regurgitation is trivial. No evidence of pulmonic stenosis. Aorta: The aortic root is normal in size and structure. Venous: The inferior vena cava is normal in size with greater than 50% respiratory variability, suggesting right atrial pressure of 3 mmHg. IAS/Shunts: No atrial level shunt detected by color flow Doppler. Additional Comments: A device lead is visualized.  LEFT VENTRICLE PLAX 2D LVIDd:         4.00 cm     Diastology LVIDs:         2.50 cm     LV e' medial:    7.88 cm/s LV PW:          0.90 cm     LV E/e' medial:  11.8 LV IVS:        0.90 cm     LV e' lateral:   10.50 cm/s LVOT diam:     2.10 cm     LV E/e' lateral: 8.9 LV SV:         81 LV SV Index:   47 LVOT Area:     3.46 cm  LV Volumes (MOD) LV vol d, MOD A2C: 72.6 ml LV vol d, MOD A4C: 51.2 ml LV vol s, MOD A2C: 20.5 ml LV vol s, MOD A4C: 19.7 ml LV SV MOD A2C:     52.1 ml LV SV MOD A4C:     51.2 ml LV SV MOD BP:      41.9 ml RIGHT VENTRICLE RV S prime:     13.40 cm/s TAPSE (M-mode): 1.6 cm LEFT ATRIUM             Index        RIGHT ATRIUM           Index LA diam:        4.50 cm 2.60 cm/m   RA Area:     16.20 cm LA Vol (A2C):   62.9 ml 36.34 ml/m  RA Volume:   38.60 ml  22.30 ml/m LA Vol (A4C):   83.1 ml 48.01 ml/m LA Biplane Vol: 72.4 ml 41.83 ml/m  AORTIC VALVE LVOT Vmax:   115.00 cm/s  LVOT Vmean:  80.700 cm/s LVOT VTI:    0.234 m AI PHT:      503 msec  AORTA Ao Root diam: 3.60 cm Ao Asc diam:  3.40 cm MITRAL VALVE               TRICUSPID VALVE MV Area (PHT): 3.77 cm    TR Peak grad:   19.5 mmHg MV Decel Time: 201 msec    TR Vmax:        221.00 cm/s MV E velocity: 93.30 cm/s MV A velocity: 46.10 cm/s  SHUNTS MV E/A ratio:  2.02        Systemic VTI:  0.23 m                            Systemic Diam: 2.10 cm Jenkins Rouge MD Electronically signed by Jenkins Rouge MD Signature Date/Time: 02/06/2022/10:43:48 AM    Final     Cardiac Studies   Cath 02/06/22:   Prox LAD lesion is 20% stenosed.   Mid LAD-1 lesion is 20% stenosed.   Mid LAD-2 lesion is 20% stenosed.   1st Mrg lesion is 20% stenosed.   Prox RCA lesion is 20% stenosed.   The left ventricular systolic function is normal.   LV end diastolic pressure is normal.   The left ventricular ejection fraction is 55-65% by visual estimate.   Three-vessel coronary calcification with mild nonobstructive CAD with a dominant RCA.   Normal LV function with EF estimated at 55 to 65%; LVEDP 12 mmHg.   RECOMMENDATION: Medical therapy for multivessel nonobstructive CAD.  Aspirin  for antiplatelet benefit.  Aggressive lipid-lowering therapy with target LDL less than 70.  TTE 02/06/22: IMPRESSIONS     1. Left ventricular ejection fraction, by estimation, is 60 to 65%. The  left ventricle has normal function. The left ventricle has no regional  wall motion abnormalities. Left ventricular diastolic parameters are  indeterminate.   2. Pacing wires in RA/RV . Right ventricular systolic function is normal.  The right ventricular size is normal. There is normal pulmonary artery  systolic pressure.   3. Left atrial size was moderately dilated.   4. Right atrial size was mildly dilated.   5. The mitral valve is abnormal. Mild mitral valve regurgitation. No  evidence of mitral stenosis.   6. The aortic valve is tricuspid. There is mild calcification of the  aortic valve. Aortic valve regurgitation is mild. Aortic valve sclerosis  is present, with no evidence of aortic valve stenosis.   7. The inferior vena cava is normal in size with greater than 50%  respiratory variability, suggesting right atrial pressure of 3 mmHg.   Patient Profile     76 y.o. female with history of permanent Afib s/p AVN ablation and PPM placement, COPD, HTN, DVT with IVC filter (now removed), GIB on [facility] s/p Watchman, and multivessel coronary calcification on CT who presented to [facility] with chest pain found to have elevated troponin prompting transfer to [facility].  Assessment & Plan    #Chest Pain: #Elevated Troponin: Patient presented to [facility] with episode of severe, substernal chest pressure with associated nausea and diaphoresis after having a bowel movement. Previously had some mild chest discomfort with exertion intermittently that resolved with rest. At [facility]  hsTn 200>>311>>309>>361>>320>>134. CT chest with multivessel coronary calcification. She underwent cath on 5/18 which showed significant coronary calcification but no evidence of obstructive disease. Will continue with  medical  management.  -Cath without obstructive disease; has heavy coronary calcificaiton -TTE with LVEF 60-65%, mild MR, mild AI -Continue ASA '81mg'$  daily, lipitor '20mg'$  daily  #Chronic Diastolic HF: TTE 20/3559 with LVEF 60-65%, no significant valve disease, diastolic function indeterminate. Repeat TTE here stable with LVEF 60-65%, mild MR, mild AR. Takes lasix as needed at home. BNP 900s at OSH. Did not appear acutely decompensated from HF standpoint. BP too soft to add spiro. -Continue lasix '60mg'$  in AM and additional '40mg'$  in PM if needed for LE edema (patient is taking BID at home) -Recommended that she increase her potassium to 110mq BID with each dose of lasix -Will need repeat BMET in 1-2 weeks following discharge -Monitor I/Os and daily weights -Low Na diet  #Permanent Afib s/p AVN ablation with PPM placement: Normal device funtion on 12/27/21. Remains Vpaced on the monitor. -Follow-up with Dr. TLovena Leas scheduled  #[facility] #[facility]  Plan for discharge today. Will need BMET within 1-2 weeks to monitor potassium and renal function.     For questions or updates, please contact CPilot RockPlease consult [email or web address] for contact info under        Signed, HFreada Bergeron MD  02/06/2022, 8:46 PM

## 2022-02-06 NOTE — Progress Notes (Signed)
  Transition of Care [facility]) Screening Note   Patient Details  Name: Sabrina [name] Date of Birth: 1946-04-11   Transition of [facility]) CM/SW Contact:    Milas Gain, Powers Phone Number: 02/06/2022, 4:40 PM    Transition of Care Department [facility]) has reviewed patient and no TOC needs have been identified at this time. We will continue to monitor patient advancement through interdisciplinary progression rounds. If new patient transition needs arise, please place a TOC consult.

## 2022-02-06 NOTE — Interval H&P Note (Signed)
Cath Lab Visit (complete for each Cath Lab visit)  Clinical Evaluation Leading to the Procedure:   ACS: No.  Non-ACS:    Anginal Classification: CCS II  Anti-ischemic medical therapy: Minimal Therapy (1 class of medications)  Non-Invasive Test Results: No non-invasive testing performed  Prior CABG: No previous CABG      History and Physical Interval Note:  02/06/2022 7:44 AM  Sabrina [name]  has presented today for surgery, with the diagnosis of CAD.  The various methods of treatment have been discussed with the patient and family. After consideration of risks, benefits and other options for treatment, the patient has consented to  Procedure(s): LEFT HEART CATH AND CORONARY ANGIOGRAPHY (N/A) as a surgical intervention.  The patient's history has been reviewed, patient examined, no change in status, stable for surgery.  I have reviewed the patient's chart and labs.  Questions were answered to the patient's satisfaction.     Shelva Majestic

## 2022-02-07 ENCOUNTER — Encounter (HOSPITAL_COMMUNITY): Payer: Self-pay | Admitting: Cardiology

## 2022-02-07 ENCOUNTER — Other Ambulatory Visit: Payer: Self-pay | Admitting: Physician Assistant

## 2022-02-07 ENCOUNTER — Other Ambulatory Visit: Payer: Self-pay

## 2022-02-07 DIAGNOSIS — R072 Precordial pain: Secondary | ICD-10-CM | POA: Diagnosis not present

## 2022-02-07 DIAGNOSIS — I2584 Coronary atherosclerosis due to calcified coronary lesion: Secondary | ICD-10-CM | POA: Diagnosis not present

## 2022-02-07 DIAGNOSIS — I5032 Chronic diastolic (congestive) heart failure: Secondary | ICD-10-CM | POA: Diagnosis not present

## 2022-02-07 DIAGNOSIS — I2510 Atherosclerotic heart disease of native coronary artery without angina pectoris: Secondary | ICD-10-CM | POA: Diagnosis not present

## 2022-02-07 LAB — BASIC METABOLIC PANEL
Anion gap: 6 (ref 5–15)
BUN: 14 mg/dL (ref 8–23)
CO2: 26 mmol/L (ref 22–32)
Calcium: 8.4 mg/dL — ABNORMAL LOW (ref 8.9–10.3)
Chloride: 108 mmol/L (ref 98–111)
Creatinine, Ser: 0.78 mg/dL (ref 0.44–1.00)
GFR, Estimated: >60 mL/min (ref >60)
Glucose, Bld: 94 mg/dL (ref 70–99)
Potassium: 3.5 mmol/L (ref 3.5–5.1)
Sodium: 140 mmol/L (ref 135–145)

## 2022-02-07 LAB — CBC
HCT: 29.7 % — ABNORMAL LOW (ref 36.0–46.0)
Hemoglobin: 9.6 g/dL — ABNORMAL LOW (ref 12.0–15.0)
MCH: 28.1 pg (ref 26.0–34.0)
MCHC: 32.3 g/dL (ref 30.0–36.0)
MCV: 86.8 fL (ref 80.0–100.0)
Platelets: 283 10*3/uL (ref 150–400)
RBC: 3.42 MIL/uL — ABNORMAL LOW (ref 3.87–5.11)
RDW: 14.8 % (ref 11.5–15.5)
WBC: 5.7 10*3/uL (ref 4.0–10.5)
nRBC: 0 % (ref 0.0–0.2)

## 2022-02-07 LAB — LIPOPROTEIN A (LPA): Lipoprotein (a): 14.5 nmol/L (ref <75.0)

## 2022-02-07 MED ORDER — POTASSIUM CHLORIDE CRYS ER 20 MEQ PO TBCR: EXTENDED_RELEASE_TABLET | ORAL | 3 refills | Status: DC

## 2022-02-07 MED ORDER — NITROGLYCERIN 0.4 MG SL SUBL: 0.4000 mg | SUBLINGUAL_TABLET | SUBLINGUAL | 12 refills | Status: AC | PRN

## 2022-02-07 MED ORDER — POTASSIUM CHLORIDE CRYS ER 20 MEQ PO TBCR
40.0000 meq | EXTENDED_RELEASE_TABLET | Freq: Once | ORAL | Status: AC
  Administered 2022-02-07: 40 meq via ORAL
  Filled 2022-02-07: qty 2

## 2022-02-07 NOTE — Progress Notes (Signed)
error 

## 2022-02-07 NOTE — Discharge Summary (Addendum)
Discharge Summary    Patient ID: Sabrina [name] MRN: [id number]; DOB: Jun 12, 1946  Admit date: 02/05/2022 Discharge date: 02/07/2022  PCP:  Sharion Balloon, [place] Providers Cardiologist:  None  Electrophysiologist:  [name], MD  {    Discharge Diagnoses    Principal Problem:   Chest pain Active Problems:   Chronic diastolic heart failure [name])   Coronary artery calcification    Diagnostic Studies/Procedures      Echo 02/06/22  1. Left ventricular ejection fraction, by estimation, is 60 to 65%. The  left ventricle has normal function. The left ventricle has no regional  wall motion abnormalities. Left ventricular diastolic parameters are  indeterminate.   2. Pacing wires in RA/RV . Right ventricular systolic function is normal.  The right ventricular size is normal. There is normal pulmonary artery  systolic pressure.   3. Left atrial size was moderately dilated.   4. Right atrial size was mildly dilated.   5. The mitral valve is abnormal. Mild mitral valve regurgitation. No  evidence of mitral stenosis.   6. The aortic valve is tricuspid. There is mild calcification of the  aortic valve. Aortic valve regurgitation is mild. Aortic valve sclerosis  is present, with no evidence of aortic valve stenosis.   7. The inferior vena cava is normal in size with greater than 50%  respiratory variability, suggesting right atrial pressure of 3 mmHg.   LEFT HEART CATH AND CORONARY ANGIOGRAPHY   Conclusion      Prox LAD lesion is 20% stenosed.   Mid LAD-1 lesion is 20% stenosed.   Mid LAD-2 lesion is 20% stenosed.   1st Mrg lesion is 20% stenosed.   Prox RCA lesion is 20% stenosed.   The left ventricular systolic function is normal.   LV end diastolic pressure is normal.   The left ventricular ejection fraction is 55-65% by visual estimate.   Three-vessel coronary calcification with mild nonobstructive CAD with a dominant RCA.   Normal LV function  with EF estimated at 55 to 65%; LVEDP 12 mmHg.   RECOMMENDATION: Medical therapy for multivessel nonobstructive CAD.  Aspirin for antiplatelet benefit.  Aggressive lipid-lowering therapy with target LDL less than 70.  Diagnostic Dominance: Right      History of Present Illness     Sabrina [name] is a 76 y.o. female with history of GI bleed while on anticoagulation, permanent atrial fibrillation, COPD, GERD, HTN, DVT with IVC filter (now removed), sinus node dysfunction s/p PPM who is being seen 02/05/2022 for the evaluation of chest pain.  patient was seen by Dr. Lovena Le in January 2016 with complaints of chest pain, shortness of breath.  Patient underwent a nuclear stress test that was negative.  Later, in 2017, patient was noted to have paroxysmal atrial fibrillation.  Heart rate was difficult to control with medications, and patient underwent AV node ablation and implantation of a Medtronic dual-chamber pacemaker on 08/27/2017.  In 01/31/2021, patient had a significant GI bleed and her anticoagulation was stopped.  Patient had an IVC filter placed.  IVC filter was removed on 06/20/2021 and patient was referred to Dr. Lysbeth Galas for possible Watchman procedure.  Patient had a Watchman device placed on 08/01/2021.  Patient had a cardiac CT on 09/20/2021 that did show three-vessel coronary calcifications noted, however calcium score was not able to be performed due to interference from left atrial appendage closer device.    Patient presented to the [facility] emergency department on 02/04/2022 complaining  of chest pain, burping.  Patient reported that she was walking when she noticed her chest pain. hsTn 200>>311>>309>>361>>320>>134. BNP elevated to 998.0. CXR showed cardiomegaly and vascular congestion. Creatinine 1.14. Patient was transported to [facility] for further evaluation.    Patient reports that she has been having issues with indigestion for the past few months.  Over this past weekend,  she went out of town and forgot to take her medicines.  Forgot her Lasix at home, noticed some ankle swelling and shortness of breath over the weekend.  Also noted she did not have a bowel movement over the weekend, had a bowel movement upon returning home on Monday.  Reports it was the largest bowel movement she has ever had.  Immediately after having bowel movement, patient reports having chest pain that felt like pressure, tightness.  Pain did not radiate and was located in the center of her chest.  Denied any shortness of breath.  Patient tried to stand up from the toilet, however felt extremely weak.  Crawled to her kitchen floor and laid there for an hour and a half before her grandson came home.  Also had nausea and vomiting. Pain was relieved with morphine in the emergency department.  Her chest wall is tender to palpation.  Hospital Course     Consultants: None  #Chest Pain: #Elevated Troponin: Patient presented to [facility] with episode of severe, substernal chest pressure with associated nausea and diaphoresis after having a bowel movement. Previously had some mild chest discomfort with exertion intermittently that resolved with rest. At [facility]  hsTn 200>>311>>309>>361>>320>>134.  -Cath without obstructive disease; has heavy coronary calcificaiton -TTE with LVEF 60-65%, mild MR, mild AI -Continue ASA '81mg'$  daily, lipitor '20mg'$  daily  02/06/2022: Cholesterol 119; HDL 54; LDL Cholesterol 51; Triglycerides 69; VLDL 14    #Chronic Diastolic HF: TTE 78/2423 with LVEF 60-65%, no significant valve disease, diastolic function indeterminate. Repeat TTE here stable with LVEF 60-65%, mild MR, mild AR. Takes lasix as needed at home. BNP 900s at OSH. Did not appear acutely decompensated from HF standpoint. BP too soft to add spiro. -Continue lasix '60mg'$  in AM and additional '40mg'$  in PM if needed for LE edema (patient is taking BID at home) -Recommended that she increase her potassium to 20mq  BID with each dose of lasix -Will need repeat BMET in 1-2 weeks following discharge -Monitor I/Os and daily weights -Low Na diet   #Permanent Afib s/p AVN ablation with PPM placement: Normal device funtion on 12/27/21. Remains Vpaced on the monitor. -Follow-up with Dr. TLovena Leas scheduled   [facility] #[facility]p [facility]: [facility]    Did the patient have an acute coronary syndrome (MI, NSTEMI, STEMI, etc) this admission?:  No                               Did the patient have a percutaneous coronary intervention (stent / angioplasty)?:  No.    Discharge Vitals Blood pressure 111/77, pulse 71, temperature 98.2 F (36.8 C), temperature source Oral, resp. rate 18, height '5\' 4"'$  (1.626 m), weight 68 kg, SpO2 98 %.  Filed Weights   02/05/22 1538 02/06/22 0445  Weight: 68.2 kg 68 kg    Labs & Radiologic Studies    CBC Recent Labs    02/06/22 0251 02/07/22 0220  WBC 6.4 5.7  HGB 10.2* 9.6*  HCT 32.0* 29.7*  MCV 87.7 86.8  PLT 283 505   Basic Metabolic Panel Recent Labs    02/06/22 0251 02/07/22 0220  NA 139 140  K 3.7 3.5  CL 105 108  CO2 29 26  GLUCOSE 99 94  BUN 14 14  CREATININE 0.86 0.78  CALCIUM 8.4* 8.4*   Liver Function Tests Recent Labs    02/05/22 1754  AST 102*  ALT 95*  ALKPHOS 163*  BILITOT 1.6*  PROT 5.6*  ALBUMIN 2.9*   Fasting Lipid Panel Recent Labs    02/06/22 0251  CHOL 119  HDL 54  LDLCALC 51  TRIG 69  CHOLHDL 2.2   Thyroid Function Tests Recent Labs    02/05/22 1754  TSH 1.734   _____________  CARDIAC CATHETERIZATION  Result Date: 02/06/2022   Prox LAD lesion is 20% stenosed.   Mid LAD-1 lesion is 20% stenosed.   Mid LAD-2 lesion is 20% stenosed.   1st Mrg lesion is 20% stenosed.   Prox RCA lesion is 20% stenosed.   The left ventricular systolic function is normal.   LV end diastolic pressure is normal.   The left ventricular ejection fraction is 55-65% by visual estimate.  Three-vessel coronary calcification with mild nonobstructive CAD with a dominant RCA. Normal LV function with EF estimated at 55 to 65%; LVEDP 12 mmHg. RECOMMENDATION: Medical therapy for multivessel nonobstructive CAD.  Aspirin for antiplatelet benefit.  Aggressive lipid-lowering therapy with target LDL less than 70.   DG CHEST PORT 1 VIEW  Result Date: 02/05/2022 CLINICAL DATA:  Chest pain. EXAM: PORTABLE CHEST 1 VIEW COMPARISON:  Chest radiograph dated 02/04/2022. FINDINGS: No focal consolidation, pleural effusion, or pneumothorax. Borderline cardiomegaly. Left atrial appendage occlusive device and left pectoral pacemaker. Atherosclerotic calcification of the aortic arch. No acute osseous pathology. Multiple surgical clips in the upper abdomen. IMPRESSION: No acute cardiopulmonary process. Electronically Signed   By: Anner Crete M.D.   On: 02/05/2022 17:26   ECHOCARDIOGRAM COMPLETE  Result Date: 02/06/2022    ECHOCARDIOGRAM REPORT   Patient Name:   Sabrina [name] Date of Exam: 02/06/2022 Medical Rec #:  [id number]          Height:       64.0 in Accession #:    [id number]         Weight:       149.9 lb Date of Birth:  [birth year]         BSA:          1.731 m Patient Age:    76 years           BP:           93/60 mmHg Patient Gender: F                  HR:           70 bpm. Exam Location:  Inpatient Procedure: 2D Echo, Cardiac Doppler and Color Doppler Indications:    R07.9* Chest pain, unspecified  History:        Patient has prior history of Echocardiogram examinations, most                 recent 08/01/2021. CHF, Previous Myocardial Infarction, COPD,                 Arrythmias:Atrial Fibrillation; Risk Factors:Hypertension and                 Dyslipidemia.  Sonographer:    Bernadene Person RDCS Referring  Phys: [id number] [place]  1. Left ventricular ejection fraction, by estimation, is 60 to 65%. The left ventricle has normal function. The left ventricle has no regional wall  motion abnormalities. Left ventricular diastolic parameters are indeterminate.  2. Pacing wires in RA/RV . Right ventricular systolic function is normal. The right ventricular size is normal. There is normal pulmonary artery systolic pressure.  3. Left atrial size was moderately dilated.  4. Right atrial size was mildly dilated.  5. The mitral valve is abnormal. Mild mitral valve regurgitation. No evidence of mitral stenosis.  6. The aortic valve is tricuspid. There is mild calcification of the aortic valve. Aortic valve regurgitation is mild. Aortic valve sclerosis is present, with no evidence of aortic valve stenosis.  7. The inferior vena cava is normal in size with greater than 50% respiratory variability, suggesting right atrial pressure of 3 mmHg. FINDINGS  Left Ventricle: Left ventricular ejection fraction, by estimation, is 60 to 65%. The left ventricle has normal function. The left ventricle has no regional wall motion abnormalities. The left ventricular internal cavity size was normal in size. There is  no left ventricular hypertrophy. Left ventricular diastolic parameters are indeterminate. Right Ventricle: Pacing wires in RA/RV. The right ventricular size is normal. No increase in right ventricular wall thickness. Right ventricular systolic function is normal. There is normal pulmonary artery systolic pressure. The tricuspid regurgitant velocity is 2.21 m/s, and with an assumed right atrial pressure of 3 mmHg, the estimated right ventricular systolic pressure is 10.2 mmHg. Left Atrium: Left atrial size was moderately dilated. Right Atrium: Right atrial size was mildly dilated. Pericardium: There is no evidence of pericardial effusion. Mitral Valve: The mitral valve is abnormal. There is mild thickening of the mitral valve leaflet(s). There is mild calcification of the mitral valve leaflet(s). Mild mitral valve regurgitation. No evidence of mitral valve stenosis. Tricuspid Valve: The tricuspid valve is  normal in structure. Tricuspid valve regurgitation is mild . No evidence of tricuspid stenosis. Aortic Valve: The aortic valve is tricuspid. There is mild calcification of the aortic valve. Aortic valve regurgitation is mild. Aortic regurgitation PHT measures 503 msec. Aortic valve sclerosis is present, with no evidence of aortic valve stenosis. Pulmonic Valve: The pulmonic valve was normal in structure. Pulmonic valve regurgitation is trivial. No evidence of pulmonic stenosis. Aorta: The aortic root is normal in size and structure. Venous: The inferior vena cava is normal in size with greater than 50% respiratory variability, suggesting right atrial pressure of 3 mmHg. IAS/Shunts: No atrial level shunt detected by color flow Doppler. Additional Comments: A device lead is visualized.  LEFT VENTRICLE PLAX 2D LVIDd:         4.00 cm     Diastology LVIDs:         2.50 cm     LV e' medial:    7.88 cm/s LV PW:         0.90 cm     LV E/e' medial:  11.8 LV IVS:        0.90 cm     LV e' lateral:   10.50 cm/s LVOT diam:     2.10 cm     LV E/e' lateral: 8.9 LV SV:         81 LV SV Index:   47 LVOT Area:     3.46 cm  LV Volumes (MOD) LV vol d, MOD A2C: 72.6 ml LV vol d, MOD A4C: 51.2 ml LV vol s, MOD A2C:  20.5 ml LV vol s, MOD A4C: 19.7 ml LV SV MOD A2C:     52.1 ml LV SV MOD A4C:     51.2 ml LV SV MOD BP:      41.9 ml RIGHT VENTRICLE RV S prime:     13.40 cm/s TAPSE (M-mode): 1.6 cm LEFT ATRIUM             Index        RIGHT ATRIUM           Index LA diam:        4.50 cm 2.60 cm/m   RA Area:     16.20 cm LA Vol (A2C):   62.9 ml 36.34 ml/m  RA Volume:   38.60 ml  22.30 ml/m LA Vol (A4C):   83.1 ml 48.01 ml/m LA Biplane Vol: 72.4 ml 41.83 ml/m  AORTIC VALVE LVOT Vmax:   115.00 cm/s LVOT Vmean:  80.700 cm/s LVOT VTI:    0.234 m AI PHT:      503 msec  AORTA Ao Root diam: 3.60 cm Ao Asc diam:  3.40 cm MITRAL VALVE               TRICUSPID VALVE MV Area (PHT): 3.77 cm    TR Peak grad:   19.5 mmHg MV Decel Time: 201 msec    TR  Vmax:        221.00 cm/s MV E velocity: 93.30 cm/s MV A velocity: 46.10 cm/s  SHUNTS MV E/A ratio:  2.02        Systemic VTI:  0.23 m                            Systemic Diam: 2.10 cm Jenkins Rouge MD Electronically signed by Jenkins Rouge MD Signature Date/Time: 02/06/2022/10:43:48 AM    Final    Disposition   Pt is being discharged home today in good condition.  Follow-up Plans & Appointments     Follow-up Information     [place], Crista Luria, [place] Follow up on 02/19/2022.   Specialty: Cardiology Why: '@10'$ :55am for hospital follow up. Please arrive 15 minutes early Contact information: [address] [place] [address] [phone]                  Discharge Medications   Allergies as of 02/07/2022       Reactions   Codeine Nausea And Vomiting   Stomach pain also        Medication List     STOP taking these medications    clopidogrel 75 MG tablet Commonly known as: PLAVIX       TAKE these medications    albuterol 108 (90 Base) MCG/ACT inhaler Commonly known as: VENTOLIN HFA INHALE 2 PUFFS BY MOUTH EVERY 6 HOURS AS NEEDED FOR WHEEZE OR SHORTNESS OF BREATH   alendronate 70 MG tablet Commonly known as: Fosamax Take 1 tablet (70 mg total) by mouth every 7 (seven) days. Take with a full glass of water on an empty stomach.   aspirin EC 81 MG tablet Take 1 tablet (81 mg total) by mouth daily. Swallow whole.   atorvastatin 20 MG tablet Commonly known as: LIPITOR Take 1 tablet (20 mg total) by mouth at bedtime.   cromolyn 5.2 MG/ACT nasal spray Commonly known as: NASALCROM Place 1 spray into both nostrils 2 (two) times daily as needed for allergies.   DULoxetine 60 MG capsule Commonly known as:  CYMBALTA TAKE 1 CAPSULE EVERY DAY   ferrous sulfate 325 (65 FE) MG tablet Take 325 mg by mouth once a week.   FISH OIL PO Take 1 capsule by mouth daily.   fluticasone 50 MCG/ACT nasal spray Commonly known as: FLONASE SPRAY 2 SPRAYS INTO EACH  NOSTRIL EVERY DAY What changed: See the new instructions.   furosemide 40 MG tablet Commonly known as: LASIX Take 1-1.5 tablets (40-60 mg total) by mouth See admin instructions. Take 1.5 tablets (60 mg) by mouth once daily in the morning & may take an additional tablet (40 mg) in the evening if needed for fluid retention.   gabapentin 100 MG capsule Commonly known as: NEURONTIN TAKE 1 CAPSULE TWICE DAILY (NEED MD APPOINTMENT) What changed: See the new instructions.   multivitamin with minerals Tabs tablet Take 1 tablet by mouth in the morning.   nitroGLYCERIN 0.4 MG SL tablet Commonly known as: NITROSTAT Place 1 tablet (0.4 mg total) under the tongue every 5 (five) minutes x 3 doses as needed for chest pain.   PA Vitamin D-3 50 MCG (2000 UT) Caps Generic drug: Cholecalciferol Take 2,000 Units by mouth in the morning.   pantoprazole 40 MG tablet Commonly known as: PROTONIX TAKE 1 TABLET TWICE DAILY   potassium chloride SA 20 MEQ tablet Commonly known as: KLOR-CON M Take 1 table in every morning. Take one table in evening only (as needed) when taking lasix in evening What changed:  how much to take how to take this when to take this additional instructions   Symbicort 80-4.5 MCG/ACT inhaler Generic drug: budesonide-formoterol Inhale 2 puffs into the lungs in the morning.   triamcinolone ointment 0.5 % Commonly known as: KENALOG Apply 1 application topically 2 (two) times daily. What changed:  when to take this reasons to take this   Vitamin B-12 5000 MCG Subl Place 5,000 mcg under the tongue in the morning.   cyanocobalamin 1000 MCG/ML injection Commonly known as: (VITAMIN B-12) Inject 1,000 mcg into the muscle every 30 (thirty) days.   vitamin C 500 MG tablet Commonly known as: ASCORBIC ACID Take 500 mg by mouth daily.   Vitamin D (Ergocalciferol) 1.25 MG (50000 UNIT) Caps capsule Commonly known as: DRISDOL TAKE 1 CAPSULE (50,000 UNITS TOTAL) BY MOUTH EVERY  7 (SEVEN) DAYS.           Outstanding Labs/Studies   BMP at follow up   Duration of Discharge Encounter   Greater than 30 minutes including physician time.  Mahalia Longest [place], [place] 02/07/2022, 11:09 AM   Patient seen and examined and agree with above. Please see progress note from today.  Gwyndolyn Kaufman, MD

## 2022-02-08 LAB — LIPOPROTEIN A (LPA): Lipoprotein (a): 21 nmol/L (ref <75.0)

## 2022-02-10 ENCOUNTER — Telehealth: Payer: Self-pay | Admitting: Family

## 2022-02-10 NOTE — Telephone Encounter (Signed)
Appt. Scheduled for 05/23

## 2022-02-11 ENCOUNTER — Encounter: Payer: Self-pay | Admitting: Nurse Practitioner

## 2022-02-11 ENCOUNTER — Ambulatory Visit (INDEPENDENT_AMBULATORY_CARE_PROVIDER_SITE_OTHER): Payer: Medicare HMO | Admitting: Nurse Practitioner

## 2022-02-11 DIAGNOSIS — R7309 Other abnormal glucose: Secondary | ICD-10-CM | POA: Diagnosis not present

## 2022-02-11 MED ORDER — BLOOD GLUCOSE METER KIT: PACK | 0 refills | Status: DC

## 2022-02-11 MED ORDER — BLOOD GLUCOSE METER KIT: PACK | 0 refills | Status: AC

## 2022-02-11 NOTE — Patient Instructions (Signed)
Heart Attack A heart attack occurs when blood and oxygen supply to the heart is cut off. A heart attack can cause damage to the heart that cannot be fixed. A heart attack is also called a myocardial infarction, or MI. If you think you are having a heart attack, do not wait to see if the symptoms will go away. Get medical help right away. What are the causes? This condition may be caused by: A fatty substance (plaque) in the blood vessels (arteries). This can block the flow of blood to the heart. A blood clot in the blood vessels that go to the heart. The blood clot blocks blood flow. An abnormal heartbeat. Some diseases, such as problems in red blood cells (anemia)orproblems in breathing (respiratory failure). Tightening (spasm) of a blood vessel that cuts off blood to the heart. A tear in a blood vessel of the heart. Other causes may include: Using drugs such as cocaine or methamphetamine. Low blood pressure. What increases the risk? Aging. The risk gets higher as you get older. Having a personal or family history of chest pain, heart attack, stroke, or narrowing of the arteries in the legs, arms, head, or stomach (peripheral vascular disease). Having taken chemotherapy or immune-suppressing medicines. Being female. Being overweight or obese. Having any of these conditions: High blood pressure. High cholesterol. Diabetes. Making lifestyle choices such as: Drinking too much alcohol. Not getting regular exercise. Smoking. What are the signs or symptoms? Chest pain. It may feel like: Crushing or squeezing. Tightness, pressure, fullness, or heaviness. Pain in the arm, neck, jaw, back, or upper body. Heartburn. Upset stomach (indigestion). Shortness of breath. Feeling like you may vomit (nauseous). Cold sweats. Sudden light-headedness, dizziness, or passing out. Feeling tired. How is this treated? A heart attack must be treated as soon as possible. Treatment may include: Medicines  to: Break up or dissolve blood clots. Thin your blood and help prevent blood clots. Treat blood pressure. Improve blood flow to the heart. Reduce pain. Reduce cholesterol. Procedures to widen a blocked artery and keep it open. Open heart surgery. Making your heart strong again (cardiac rehabilitation) through exercise, education, and counseling. Follow these instructions at home: Medicines Take over-the-counter and prescription medicines only as told by your doctor. Do not take these medicines unless your doctor says it is okay: NSAIDs, such as ibuprofen, naproxen, or celecoxib. Any vitamins or supplements. Hormone replacement therapy that has estrogen with or without progestin. If you are taking blood thinners: Talk with your doctor before taking any medicines that have aspirin or NSAIDs, such as ibuprofen. Take medicines exactly as told. Take them at the same time each day. Avoid doing things that could hurt or bruise you. Take action to prevent falls. Wear an alert bracelet or carry a card that shows you are taking blood thinners. Lifestyle  Do not smoke or use any products that contain nicotine or tobacco. If you need help quitting, ask your doctor. Avoid secondhand smoke. Exercise regularly. Ask your doctor about a cardiac rehab program. Eat heart-healthy foods. Your doctor will tell you what foods to eat. Stay at a healthy weight. Learn ways to lower your stress level. Do not use illegal drugs. Alcohol use Do not drink alcohol if: Your doctor tells you not to drink. You are pregnant, may be pregnant, or are planning to become pregnant. If you drink alcohol: Limit how much you have to: 0-1 drink a day for women. 0-2 drinks a day for men. Know how much alcohol is in   your drink. In the [place] one drink equals one 12 oz bottle of beer (355 mL), one 5 oz glass of wine (148 mL), or one 1 oz glass of hard liquor (44 mL). General instructions Work with your doctor to treat  other problems you may have, such as diabetes or high blood pressure. Get screened for depression. Get treatment if needed. Keep your vaccines up to date. Get the flu shot (influenza vaccine) every year. Keep all follow-up visits. Contact a doctor if: You feel very sad. You have trouble doing your daily activities. You get light-headed or dizzy. Get help right away if: You have sudden, unexplained discomfort in your chest, arms, back, neck, jaw, or upper body. You have shortness of breath. You have sudden sweating or clammy skin. You feel like you may vomit or you vomit. You feel tired or weak. You feel your heart beating fast. You feel your heart skipping beats. You have blood pressure that is higher than 180/120. These symptoms may be an emergency. Get help right away. Call your local emergency services (911 in the [place] Do not wait to see if the symptoms will go away. Do not drive yourself to the hospital. Summary A heart attack occurs when blood and oxygen supply to the heart is cut off. Do not take NSAIDs unless your doctor says it is okay. Do not smoke. Avoid secondhand smoke. Exercise regularly. Ask your doctor about a cardiac rehab program. This information is not intended to replace advice given to you by your health care provider. Make sure you discuss any questions you have with your health care provider. Document Revised: 02/28/2021 Document Reviewed: 02/28/2021 Elsevier Patient Education  2023 [employer]  

## 2022-02-11 NOTE — Progress Notes (Signed)
Patient here for Hospital Followup

## 2022-02-11 NOTE — Progress Notes (Signed)
 Established Patient Office Visit  Subjective   Patient ID: Sabrina [name], female    DOB: 07/27/1946  Age: 76 y.o. MRN: [id number]  Chief Complaint  Patient presents with   Hospitalization Follow-up    Hospitalized for Acute Pancreatitis     HPI  Patient is a 76-year-old female who presents to clinic for hospital follow-up after acute pancreatitis on 02/05/2022. Patient is reporting improved symptoms today with no pain, headaches, dizziness or tenderness.  Patient is aware to follow-up with discharge instructions, follow-up appointments, and taking medication as prescribed.  Patient Active Problem List   Diagnosis Date Noted   Coronary artery calcification    Chest pain 02/05/2022   Presence of Watchman left atrial appendage closure device 08/01/2021   History of DVT (deep vein thrombosis) 05/07/2021   Chronic anticoagulation 12/14/2020   Calcaneal bursitis 12/12/2020   Acute upper GI bleed 12/08/2020   Complete heart block (HCC) 11/09/2020   Osteoporosis 10/11/2019   Pacemaker 10/04/2019   Near syncope    Hyperlipidemia 06/23/2016   Colovesical fistula 06/12/2016   Atrial fibrillation with RVR (HCC) 06/12/2016   GERD (gastroesophageal reflux disease) 06/12/2016   Fibromyalgia 06/12/2016   Peripheral neuropathy 06/12/2016   S/P gastric bypass 01/22/2016   S/P splenectomy 01/22/2016   S/P cholecystectomy 01/22/2016   Hx of small bowel obstruction 01/22/2016   Asthma 10/19/2015   Generalized anxiety disorder 10/19/2015   Depression 10/19/2015   Insomnia 10/19/2015   Iron deficiency anemia 10/19/2015   Vitamin D deficiency 11/29/2014   Eczema 11/27/2014   Benign essential HTN 09/25/2014   Overweight (BMI 25.0-29.9) 09/25/2014   Chronic diastolic heart failure (HCC) 09/25/2014   Allergic rhinitis 02/28/2013   Past Medical History:  Diagnosis Date   Anemia    Ankle fracture, right    past hx. -"no surgery"   Anxiety    Asthma    CHF (congestive heart  failure) (HCC) 2009   Chronic lower back pain    Collagen vascular disease (HCC)    COPD (chronic obstructive pulmonary disease) (HCC)    Depression    Fibromyalgia    GERD (gastroesophageal reflux disease)    Hyperlipidemia    Hypertension    Immature cataract of both eyes    Myocardial infarction (HCC)    "I've had a light one; don't know when it happened" (08/27/2017)   Osteoarthritis    Peripheral neuropathy    legs and feet   Persistent atrial fibrillation (HCC)    Presence of Watchman left atrial appendage closure device 08/01/2021   24 mm Watchman FLXDevice LOT # [id number] by Dr. Lambert   Tremors of nervous system    noted in hands by pt last 6 months   Tubular adenoma of colon    Past Surgical History:  Procedure Laterality Date   APPENDECTOMY     AV NODE ABLATION N/A 08/27/2017   Procedure: AV NODE ABLATION;  Surgeon: Taylor, Gregg W, MD;  Location: [facility];  Service: Cardiovascular;  Laterality: N/A;   CHOLECYSTECTOMY OPEN  1978   DILATION AND CURETTAGE OF UTERUS     FEMUR FRACTURE SURGERY Left 2013   "put 7" rod in it"   FRACTURE SURGERY     IVC FILTER REMOVAL N/A 06/20/2021   Procedure: IVC FILTER REMOVAL;  Surgeon: Clark, Christopher J, MD;  Location: [facility];  Service: Cardiovascular;  Laterality: N/A;   LAPAROSCOPY  08/22/2016   Procedure: LAPAROSCOPY DIAGNOSTIC;  Surgeon: Alicia Thomas, MD;  Location:   [facility];  Service: General;;   LEFT ATRIAL APPENDAGE OCCLUSION N/A 08/01/2021   Procedure: LEFT ATRIAL APPENDAGE OCCLUSION;  Surgeon: Lambert, Cameron T, MD;  Location: [facility];  Service: Cardiovascular;  Laterality: N/A;   LEFT HEART CATH AND CORONARY ANGIOGRAPHY N/A 02/06/2022   Procedure: LEFT HEART CATH AND CORONARY ANGIOGRAPHY;  Surgeon: Kelly, Thomas A, MD;  Location: [facility];  Service: Cardiovascular;  Laterality: N/A;   MEDIAL PARTIAL KNEE REPLACEMENT Right 2005   "@ [facility]"   PACEMAKER IMPLANT N/A 08/27/2017    Procedure: PACEMAKER IMPLANT;  Surgeon: Taylor, Gregg W, MD;  Location: [facility];  Service: Cardiovascular;  Laterality: N/A;   ROUX-EN-Y GASTRIC BYPASS  2002   [facility]   SPLENECTOMY  2002   TEE WITHOUT CARDIOVERSION N/A 08/01/2021   Procedure: TRANSESOPHAGEAL ECHOCARDIOGRAM (TEE);  Surgeon: Lambert, Cameron T, MD;  Location: [facility];  Service: Cardiovascular;  Laterality: N/A;   TONSILLECTOMY  1944   TUBAL LIGATION     VAGINAL HYSTERECTOMY     Social History   Tobacco Use   Smoking status: Former    Packs/day: 0.50    Years: 25.00    Pack years: 12.50    Types: Cigarettes    Quit date: 02/26/1993    Years since quitting: 28.9   Smokeless tobacco: Never  Vaping Use   Vaping Use: Never used  Substance Use Topics   Alcohol use: Not Currently    Comment: 08/27/2017 "nothing since early 2000s"   Drug use: Not Currently   Social History   Socioeconomic History   Marital status: Divorced    Spouse name: Not on file   Number of children: 3   Years of education: 2 years of college   Highest education level: Some college, no degree  Occupational History   Occupation: Retired  Tobacco Use   Smoking status: Former    Packs/day: 0.50    Years: 25.00    Pack years: 12.50    Types: Cigarettes    Quit date: 02/26/1993    Years since quitting: 28.9   Smokeless tobacco: Never  Vaping Use   Vaping Use: Never used  Substance and Sexual Activity   Alcohol use: Not Currently    Comment: 08/27/2017 "nothing since early 2000s"   Drug use: Not Currently   Sexual activity: Not Currently    Birth control/protection: Surgical  Other Topics Concern   Not on file  Social History Narrative   ** Merged History Encounter **       Pt is right handed Lives in single story home with her grandson Has 3 adult children Associated degree  Retired CNA   Social Determinants of Health   Financial Resource Strain: Low Risk    Difficulty of Paying Living  Expenses: Not very hard  Food Insecurity: No Food Insecurity   Worried About Running Out of Food in the Last Year: Never true   Ran Out of Food in the Last Year: Never true  Transportation Needs: No Transportation Needs   Lack of Transportation (Medical): No   Lack of Transportation (Non-Medical): No  Physical Activity: Sufficiently Active   Days of Exercise per Week: 3 days   Minutes of Exercise per Session: 60 min  Stress: Stress Concern Present   Feeling of Stress : To some extent  Social Connections: Moderately Integrated   Frequency of Communication with Friends and Family: More than three times a week   Frequency of   Social Gatherings with Friends and Family: Twice a week   Attends Religious Services: 1 to 4 times per year   Active Member of [employer] or Organizations: Yes   Attends Music therapist: 1 to 4 times per year   Marital Status: Divorced  Human resources officer Violence: Not At Risk   Fear of Current or Ex-Partner: No   Emotionally Abused: No   Physically Abused: No   Sexually Abused: No   Family Status  Relation Name Status   Mother  Deceased   Father  Deceased   Daughter  Alive   Pat Uncle  Deceased   MGM  Deceased   MGF  Deceased   PGM  Deceased   PGF  Deceased   Son  Alive   Son  Alive   Neg Hx  (Not Specified)   Family History  Problem Relation Age of Onset   Cancer Mother    Alzheimer's disease Mother    Heart disease Mother    Arthritis Father    Asthma Daughter    Arthritis Daughter    Obesity Daughter    Colon cancer Paternal Uncle 21   Arthritis Son    Hyperlipidemia Son    Obesity Son    Arthritis Son    Obesity Son    BRCA 1/2 Neg Hx    Allergies  Allergen Reactions   Codeine Nausea And Vomiting    Stomach pain also      Review of Systems  Constitutional: Negative.   HENT: Negative.    Respiratory: Negative.    Cardiovascular: Negative.  Negative for chest pain, palpitations, orthopnea, claudication and leg swelling.   Gastrointestinal:  Negative for abdominal pain, nausea and vomiting.  Genitourinary: Negative.   Skin: Negative.  Negative for rash.  All other systems reviewed and are negative.    Objective:     There were no vitals taken for this visit. BP Readings from Last 3 Encounters:  02/07/22 111/77  01/13/22 100/70  09/20/21 104/74   Wt Readings from Last 3 Encounters:  02/06/22 149 lb 14.6 oz (68 kg)  01/13/22 152 lb (68.9 kg)  08/19/21 155 lb 12.8 oz (70.7 kg)      Physical Exam Vitals and nursing note reviewed.  Constitutional:      Appearance: Normal appearance.  HENT:     Head: Normocephalic.     Right Ear: External ear normal.     Left Ear: External ear normal.  Eyes:     Conjunctiva/sclera: Conjunctivae normal.  Cardiovascular:     Rate and Rhythm: Normal rate and regular rhythm.     Pulses: Normal pulses.     Heart sounds: Normal heart sounds.  Pulmonary:     Effort: Pulmonary effort is normal.     Breath sounds: Normal breath sounds.  Abdominal:     General: Bowel sounds are normal.  Skin:    General: Skin is warm.     Findings: No rash.  Neurological:     Mental Status: She is alert and oriented to person, place, and time.   Patient reports elevated glucose while in the hospital and was told by hospital physician to get a glucometer to test/check blood sugar readings on a daily basis.  Education provided to patient printed handouts given.  No results found for any visits on 02/11/22.  Last CBC Lab Results  Component Value Date   WBC 5.7 02/07/2022   HGB 9.6 (L) 02/07/2022   HCT 29.7 (L) 02/07/2022  MCV 86.8 02/07/2022   MCH 28.1 02/07/2022   RDW 14.8 02/07/2022   PLT 283 02/07/2022   Last metabolic panel Lab Results  Component Value Date   GLUCOSE 94 02/07/2022   NA 140 02/07/2022   K 3.5 02/07/2022   CL 108 02/07/2022   CO2 26 02/07/2022   BUN 14 02/07/2022   CREATININE 0.78 02/07/2022   GFRNONAA >60 02/07/2022   CALCIUM 8.4 (L)  02/07/2022   PROT 5.6 (L) 02/05/2022   ALBUMIN 2.9 (L) 02/05/2022   LABGLOB 2.5 05/07/2021   AGRATIO 1.7 05/07/2021   BILITOT 1.6 (H) 02/05/2022   ALKPHOS 163 (H) 02/05/2022   AST 102 (H) 02/05/2022   ALT 95 (H) 02/05/2022   ANIONGAP 6 02/07/2022   Last lipids Lab Results  Component Value Date   CHOL 119 02/06/2022   HDL 54 02/06/2022   LDLCALC 51 02/06/2022   TRIG 69 02/06/2022   CHOLHDL 2.2 02/06/2022   Last hemoglobin A1c Lab Results  Component Value Date   HGBA1C 5.9 09/27/2018   Last thyroid functions Lab Results  Component Value Date   TSH 1.734 02/05/2022   T4TOTAL 6.7 10/19/2015      The ASCVD Risk score (Arnett DK, et al., 2019) failed to calculate for the following reasons:   The patient has a prior MI or stroke diagnosis    Assessment & Plan:  Completed hospital discharge instructions.  Follow-up appointments and medication reconciliation.  Patient knows to follow-up with signs/symptoms of impending stroke/patient verbalized understanding. Problem List Items Addressed This Visit   None Visit Diagnoses     Elevated glucose    -  Primary   Relevant Medications   blood glucose meter kit and supplies       Return if symptoms worsen or fail to improve.     M , NP  

## 2022-02-12 ENCOUNTER — Telehealth: Payer: Self-pay | Admitting: *Deleted

## 2022-02-12 MED ORDER — TRUE METRIX AIR GLUCOSE METER W/DEVICE KIT: PACK | 0 refills | Status: AC

## 2022-02-12 MED ORDER — TRUEPLUS LANCETS 33G MISC: 3 refills | Status: AC

## 2022-02-12 MED ORDER — TRUE METRIX BLOOD GLUCOSE TEST VI STRP: ORAL_STRIP | 3 refills | Status: AC

## 2022-02-12 NOTE — Telephone Encounter (Signed)
Fax from [facility] for True Metrix meter, test strips & lancets Meter of ins choose was sent to local pharmacy yesterday Requested orders sent to [place] With [facility] guidelines, pt is not on insulin, can only write script to check BS daily

## 2022-02-19 ENCOUNTER — Ambulatory Visit (INDEPENDENT_AMBULATORY_CARE_PROVIDER_SITE_OTHER): Payer: Medicare HMO | Admitting: Physician Assistant

## 2022-02-19 ENCOUNTER — Other Ambulatory Visit: Payer: Self-pay | Admitting: Family

## 2022-02-19 ENCOUNTER — Encounter: Payer: Self-pay | Admitting: Physician Assistant

## 2022-02-19 VITALS — BP 120/70 | HR 71 | Ht 64.0 in | Wt 147.0 lb

## 2022-02-19 DIAGNOSIS — I5032 Chronic diastolic (congestive) heart failure: Secondary | ICD-10-CM

## 2022-02-19 DIAGNOSIS — Z79899 Other long term (current) drug therapy: Secondary | ICD-10-CM

## 2022-02-19 DIAGNOSIS — I4821 Permanent atrial fibrillation: Secondary | ICD-10-CM | POA: Diagnosis not present

## 2022-02-19 DIAGNOSIS — I2510 Atherosclerotic heart disease of native coronary artery without angina pectoris: Secondary | ICD-10-CM

## 2022-02-19 DIAGNOSIS — I442 Atrioventricular block, complete: Secondary | ICD-10-CM | POA: Diagnosis not present

## 2022-02-19 DIAGNOSIS — F411 Generalized anxiety disorder: Secondary | ICD-10-CM

## 2022-02-19 LAB — BASIC METABOLIC PANEL
BUN/Creatinine Ratio: 20 (ref 12–28)
BUN: 18 mg/dL (ref 8–27)
CO2: 27 mmol/L (ref 20–29)
Calcium: 9.1 mg/dL (ref 8.7–10.3)
Chloride: 100 mmol/L (ref 96–106)
Creatinine, Ser: 0.91 mg/dL (ref 0.57–1.00)
Glucose: 91 mg/dL (ref 70–99)
Potassium: 4 mmol/L (ref 3.5–5.2)
Sodium: 140 mmol/L (ref 134–144)
eGFR: 66 mL/min/{1.73_m2} (ref >59)

## 2022-02-19 MED ORDER — POTASSIUM CHLORIDE CRYS ER 20 MEQ PO TBCR
EXTENDED_RELEASE_TABLET | ORAL | 3 refills | Status: DC
Start: 2022-02-19 — End: 2022-02-20

## 2022-02-19 NOTE — Progress Notes (Signed)
Cardiology Office Note:    Date:  02/19/2022   ID:  Sabrina [name], Juul 11-07-1945, MRN [id number]  PCP:  Sharion Balloon, [facility] Cardiologist:  Carlyle Dolly, MD  [facility] Electrophysiologist:  [name], MD   Chief Complaint: Hospital follow up  History of Present Illness:    Sabrina [name] is a 76 y.o. female with a hx of GI bleed while on anticoagulation, permanent atrial fibrillation, COPD, GERD, HTN, DVT with IVC filter (now removed), sinus node dysfunction s/p PPM seen for hospital follow up.   Patient was seen by Dr. Lovena Le in January 2016 with complaints of chest pain, shortness of breath.  Patient underwent a nuclear stress test that was negative.  Later, in 2017, patient was noted to have paroxysmal atrial fibrillation.  Heart rate was difficult to control with medications, and patient underwent AV node ablation and implantation of a Medtronic dual-chamber pacemaker on 08/27/2017.  In 01/31/2021, patient had a significant GI bleed and her anticoagulation was stopped.  Patient had an IVC filter placed.  IVC filter was removed on 06/20/2021 and patient was referred to Dr. Lysbeth Galas for possible Watchman procedure.  Patient had a Watchman device placed on 08/01/2021.  Patient had a cardiac CT on 09/20/2021 that did show three-vessel coronary calcifications noted, however calcium score was not able to be performed due to interference from left atrial appendage closer device.   Patient presented to [facility] with episode of severe, substernal chest pressure with associated nausea and diaphoresis after having a bowel movement. Previously had some mild chest discomfort with exertion intermittently that resolved with rest. At [facility]  hsTn 200>>311>>309>>361>>320>>134. Transferred to [facility]. Cath without obstructive disease; has heavy coronary calcification. TTE with LVEF 60-65%, mild MR, mild AI.   Here today for follow up.  She denies recurrent chest  pain, shortness of breath or palpitation.  Occasional orthopnea.  She continues to take Lasix 60 mg a.m. and 40 mg p.m. with potassium 20 mEq twice daily.  She will continue this dose until lab work tomorrow.  Try to watch her salt.  She is planning to start back her exercise.  Past Medical History:  Diagnosis Date   Anemia    Ankle fracture, right    past hx. -"no surgery"   Anxiety    Asthma    CHF (congestive heart failure) ([place]) 2009   Chronic lower back pain    Collagen vascular disease (HCC)    COPD (chronic obstructive pulmonary disease) (HCC)    Depression    Fibromyalgia    GERD (gastroesophageal reflux disease)    Hyperlipidemia    Hypertension    Immature cataract of both eyes    Myocardial infarction ([place])    "I've had a light one; don't know when it happened" (08/27/2017)   Osteoarthritis    Peripheral neuropathy    legs and feet   Persistent atrial fibrillation ([place])    Presence of Watchman left atrial appendage closure device 08/01/2021   24 mm Watchman FLXDevice LOT # [id number] by Dr. Quentin Ore   Tremors of nervous system    noted in hands by pt last 6 months   Tubular adenoma of colon     Past Surgical History:  Procedure Laterality Date   APPENDECTOMY     AV NODE ABLATION N/A 08/27/2017   Procedure: AV NODE ABLATION;  Surgeon: Evans Lance, MD;  Location: [place] [facility];  Service: Cardiovascular;  Laterality: N/A;   CHOLECYSTECTOMY  OPEN  1978   DILATION AND CURETTAGE OF UTERUS     FEMUR FRACTURE SURGERY Left 2013   "put 7" rod in it"   FRACTURE SURGERY     IVC FILTER REMOVAL N/A 06/20/2021   Procedure: IVC FILTER REMOVAL;  Surgeon: Marty Heck, MD;  Location: [place] [facility];  Service: Cardiovascular;  Laterality: N/A;   LAPAROSCOPY  08/22/2016   Procedure: LAPAROSCOPY DIAGNOSTIC;  Surgeon: [name], MD;  Location: [facility];  Service: General;;   LEFT ATRIAL APPENDAGE OCCLUSION N/A 08/01/2021   Procedure: LEFT ATRIAL APPENDAGE OCCLUSION;   Surgeon: Vickie Epley, MD;  Location: [place] [facility];  Service: Cardiovascular;  Laterality: N/A;   LEFT HEART CATH AND CORONARY ANGIOGRAPHY N/A 02/06/2022   Procedure: LEFT HEART CATH AND CORONARY ANGIOGRAPHY;  Surgeon: Troy Sine, MD;  Location: [place] [facility];  Service: Cardiovascular;  Laterality: N/A;   MEDIAL PARTIAL KNEE REPLACEMENT Right 2005   "@ [facility]"   PACEMAKER IMPLANT N/A 08/27/2017   Procedure: PACEMAKER IMPLANT;  Surgeon: Evans Lance, MD;  Location: [place] [facility];  Service: Cardiovascular;  Laterality: N/A;   ROUX-EN-Y GASTRIC BYPASS  2002   [place]  2002   TEE WITHOUT CARDIOVERSION N/A 08/01/2021   Procedure: TRANSESOPHAGEAL ECHOCARDIOGRAM (TEE);  Surgeon: Vickie Epley, MD;  Location: [place] [facility];  Service: Cardiovascular;  Laterality: N/A;   TONSILLECTOMY  1944   TUBAL LIGATION     VAGINAL HYSTERECTOMY      Current Medications: Current Meds  Medication Sig   albuterol (VENTOLIN HFA) 108 (90 Base) MCG/ACT inhaler INHALE 2 PUFFS BY MOUTH EVERY 6 HOURS AS NEEDED FOR WHEEZE OR SHORTNESS OF BREATH   alendronate (FOSAMAX) 70 MG tablet Take 1 tablet (70 mg total) by mouth every 7 (seven) days. Take with a full glass of water on an empty stomach.   aspirin EC 81 MG EC tablet Take 1 tablet (81 mg total) by mouth daily. Swallow whole.   atorvastatin (LIPITOR) 20 MG tablet Take 1 tablet (20 mg total) by mouth at bedtime. (Patient taking differently: Take 20 mg by mouth at bedtime. Per patient taking 40 mg of the 20 mg tablets)   blood glucose meter kit and supplies Dispense based on patient and insurance preference. Use up to four times daily as directed. (FOR ICD-10 E10.9, E11.9).   Blood Glucose Monitoring Suppl (TRUE METRIX AIR GLUCOSE METER) w/Device KIT Test BS daily Dx R73.09   Cholecalciferol (PA VITAMIN D-3) 50 MCG (2000 UT) CAPS Take 2,000 Units by mouth in the morning.   cromolyn (NASALCROM) 5.2  MG/ACT nasal spray Place 1 spray into both nostrils 2 (two) times daily as needed for allergies.   cyanocobalamin (,VITAMIN B-12,) 1000 MCG/ML injection Inject 1,000 mcg into the muscle every 30 (thirty) days.   Cyanocobalamin (VITAMIN B-12) 5000 MCG SUBL Place 5,000 mcg under the tongue in the morning.   DULoxetine (CYMBALTA) 60 MG capsule TAKE 1 CAPSULE EVERY DAY   ferrous sulfate 325 (65 FE) MG tablet Take 325 mg by mouth once a week.   fluticasone (FLONASE) 50 MCG/ACT nasal spray SPRAY 2 SPRAYS INTO EACH NOSTRIL EVERY DAY (Patient taking differently: 2 sprays daily as needed for allergies.)   furosemide (LASIX) 40 MG tablet Take 1-1.5 tablets (40-60 mg total) by mouth See admin instructions. Take 1.5 tablets (60 mg) by mouth once daily in the morning & may take an additional tablet (40 mg)  in the evening if needed for fluid retention.   gabapentin (NEURONTIN) 100 MG capsule TAKE 1 CAPSULE TWICE DAILY (NEED MD APPOINTMENT) (Patient taking differently: 100 mg at bedtime.)   glucose blood (TRUE METRIX BLOOD GLUCOSE TEST) test strip Test BS daily Dx R73.09   Multiple Vitamin (MULTIVITAMIN WITH MINERALS) TABS tablet Take 1 tablet by mouth in the morning.   nitroGLYCERIN (NITROSTAT) 0.4 MG SL tablet Place 1 tablet (0.4 mg total) under the tongue every 5 (five) minutes x 3 doses as needed for chest pain.   Omega-3 Fatty Acids (FISH OIL PO) Take 1 capsule by mouth daily.   pantoprazole (PROTONIX) 40 MG tablet TAKE 1 TABLET TWICE DAILY   SYMBICORT 80-4.5 MCG/ACT inhaler Inhale 2 puffs into the lungs in the morning.   triamcinolone ointment (KENALOG) 0.5 % Apply 1 application topically 2 (two) times daily. (Patient taking differently: Apply 1 application. topically 2 (two) times daily as needed (rash).)   TRUEplus Lancets 33G MISC Test BS daily Dx R73.09   vitamin C (ASCORBIC ACID) 500 MG tablet Take 500 mg by mouth daily.   Vitamin D, Ergocalciferol, (DRISDOL) 1.25 MG (50000 UNIT) CAPS capsule TAKE 1  CAPSULE (50,000 UNITS TOTAL) BY MOUTH EVERY 7 (SEVEN) DAYS.   [DISCONTINUED] potassium chloride SA (KLOR-CON M) 20 MEQ tablet Take 1 table in every morning. Take one table in evening only (as needed) when taking lasix in evening     Allergies:   Codeine   Social History   Socioeconomic History   Marital status: Divorced    Spouse name: Not on file   Number of children: 3   Years of education: 2 years of college   Highest education level: Some college, no degree  Occupational History   Occupation: Retired  Tobacco Use   Smoking status: Former    Packs/day: 0.50    Years: 25.00    Pack years: 12.50    Types: Cigarettes    Quit date: 02/26/1993    Years since quitting: 29.0   Smokeless tobacco: Never  Vaping Use   Vaping Use: Never used  Substance and Sexual Activity   Alcohol use: Not Currently    Comment: 08/27/2017 "nothing since early 2000s"   Drug use: Not Currently   Sexual activity: Not Currently    Birth control/protection: Surgical  Other Topics Concern   Not on file  Social History Narrative   ** Merged History Encounter **       Pt is right handed Lives in single story home with her grandson Has 3 adult children Associated degree  Retired Quarry manager   Social Determinants of Radio broadcast assistant Strain: Low Risk    Difficulty of Paying Living Expenses: Not very hard  Food Insecurity: No Food Insecurity   Worried About Charity fundraiser in the Last Year: Never true   Arboriculturist in the Last Year: Never true  Transportation Needs: No Transportation Needs   Lack of Transportation (Medical): No   Lack of Transportation (Non-Medical): No  Physical Activity: Sufficiently Active   Days of Exercise per Week: 3 days   Minutes of Exercise per Session: 60 min  Stress: Stress Concern Present   Feeling of Stress : To some extent  Social Connections: Moderately Integrated   Frequency of Communication with Friends and Family: More than three times a week    Frequency of Social Gatherings with Friends and Family: Twice a week   Attends Religious Services: 1 to 4 times  per year   Active Member of Clubs or Organizations: Yes   Attends Archivist Meetings: 1 to 4 times per year   Marital Status: Divorced     Family History: The patient's family history includes Alzheimer's disease in her mother; Arthritis in her daughter, father, son, and son; Asthma in her daughter; Cancer in her mother; Colon cancer (age of onset: 77) in her paternal uncle; Heart disease in her mother; Hyperlipidemia in her son; Obesity in her daughter, son, and son. There is no history of BRCA 1/2.    ROS:   Please see the history of present illness.    All other systems reviewed and are negative.   EKGs/Labs/Other Studies Reviewed:    The following studies were reviewed today:    Echo 02/06/22  1. Left ventricular ejection fraction, by estimation, is 60 to 65%. The  left ventricle has normal function. The left ventricle has no regional  wall motion abnormalities. Left ventricular diastolic parameters are  indeterminate.   2. Pacing wires in RA/RV . Right ventricular systolic function is normal.  The right ventricular size is normal. There is normal pulmonary artery  systolic pressure.   3. Left atrial size was moderately dilated.   4. Right atrial size was mildly dilated.   5. The mitral valve is abnormal. Mild mitral valve regurgitation. No  evidence of mitral stenosis.   6. The aortic valve is tricuspid. There is mild calcification of the  aortic valve. Aortic valve regurgitation is mild. Aortic valve sclerosis  is present, with no evidence of aortic valve stenosis.   7. The inferior vena cava is normal in size with greater than 50%  respiratory variability, suggesting right atrial pressure of 3 mmHg.    LEFT HEART CATH AND CORONARY ANGIOGRAPHY    Conclusion       Prox LAD lesion is 20% stenosed.   Mid LAD-1 lesion is 20% stenosed.   Mid LAD-2  lesion is 20% stenosed.   1st Mrg lesion is 20% stenosed.   Prox RCA lesion is 20% stenosed.   The left ventricular systolic function is normal.   LV end diastolic pressure is normal.   The left ventricular ejection fraction is 55-65% by visual estimate.   Three-vessel coronary calcification with mild nonobstructive CAD with a dominant RCA.   Normal LV function with EF estimated at 55 to 65%; LVEDP 12 mmHg.   RECOMMENDATION: Medical therapy for multivessel nonobstructive CAD.  Aspirin for antiplatelet benefit.  Aggressive lipid-lowering therapy with target LDL less than 70.  Diagnostic Dominance: Right       EKG:  EKG is not  ordered today.  Recent Labs: 02/05/2022: ALT 95; TSH 1.734 02/07/2022: BUN 14; Creatinine, Ser 0.78; Hemoglobin 9.6; Platelets 283; Potassium 3.5; Sodium 140  Recent Lipid Panel    Component Value Date/Time   CHOL 119 02/06/2022 0251   CHOL 199 09/30/2019 1018   TRIG 69 02/06/2022 0251   HDL 54 02/06/2022 0251   HDL 60 09/30/2019 1018   CHOLHDL 2.2 02/06/2022 0251   VLDL 14 02/06/2022 0251   LDLCALC 51 02/06/2022 0251   LDLCALC 115 (H) 09/30/2019 1018     Risk Assessment/Calculations:    CHA2DS2-VASc Score = 4   This indicates a 4.8% annual risk of stroke. The patient's score is based upon: CHF History: 1 HTN History: 1 Diabetes History: 0 Stroke History: 0 Vascular Disease History: 0 Age Score: 1 Gender Score: 1      Physical Exam:  VS:  BP 120/70   Pulse 71   Ht '5\' 4"'  (1.626 m)   Wt 147 lb (66.7 kg)   SpO2 98%   BMI 25.23 kg/m     Wt Readings from Last 3 Encounters:  02/19/22 147 lb (66.7 kg)  02/06/22 149 lb 14.6 oz (68 kg)  01/13/22 152 lb (68.9 kg)     GEN:  Well nourished, well developed in no acute distress HEENT: Normal NECK: No JVD; No carotid bruits LYMPHATICS: No lymphadenopathy CARDIAC: Ir IR, no murmurs, rubs, gallops RESPIRATORY:  Clear to auscultation without rales, wheezing or rhonchi  ABDOMEN: Soft,  non-tender, non-distended MUSCULOSKELETAL:  No edema; No deformity  SKIN: Warm and dry NEUROLOGIC:  Alert and oriented x 3 PSYCHIATRIC:  Normal affect   ASSESSMENT AND PLAN:    Non obstructive CAD No angina.  Continue aspirin and statin.  2. Chronic diastolic CHF -Intermittent orthopnea.  We will continue current dose of Lasix 60 mg a.m. and 40 mg p.m.  Continue K-Dur 20 mEq twice daily.  Check BMP today.  Based on labs she will need prescription.  3. Hx of GIB on AC s/p watchman  -Denies any bleeding.  4. Permanent afib s/p ablation and PPM - Followed by Dr. Lovena Le  Medication Adjustments/Labs and Tests Ordered: Current medicines are reviewed at length with the patient today.  Concerns regarding medicines are outlined above.  Orders Placed This Encounter  Procedures   Basic Metabolic Panel (BMET)   Meds ordered this encounter  Medications   potassium chloride SA (KLOR-CON M) 20 MEQ tablet    Sig: Take 1 table in every morning. Take one table in evening only (as needed) when taking lasix in evening    Dispense:  90 tablet    Refill:  3    Patient Instructions  Medication Instructions:  Your physician recommends that you continue on your current medications as directed. Please refer to the Current Medication list given to you today.  *If you need a refill on your cardiac medications before your next appointment, please call your pharmacy*   Lab Work: Lab work to be done today--BMP If you have labs (blood work) drawn today and your tests are completely normal, you will receive your results only by: [place] (if you have MyChart) OR A paper copy in the mail If you have any lab test that is abnormal or we need to change your treatment, we will call you to review the results.   Testing/Procedures: none   Follow-Up: At [facility], you and your health needs are our priority.  As part of our continuing mission to provide you with exceptional heart care, we  have created designated Provider Care Teams.  These Care Teams include your primary Cardiologist (physician) and Advanced Practice Providers (APPs -  Physician Assistants and Nurse Practitioners) who all work together to provide you with the care you need, when you need it.  We recommend signing up for the patient portal called "MyChart".  Sign up information is provided on this After Visit Summary.  MyChart is used to connect with patients for Virtual Visits (Telemedicine).  Patients are able to view lab/test results, encounter notes, upcoming appointments, etc.  Non-urgent messages can be sent to your provider as well.   To learn more about what you can do with MyChart, go to [email or web address].    Your next appointment:   May 01, 2022  The format for your next appointment:   In Person  Provider:  Dr Domenic Polite in [facility]    Other Instructions   Important Information About Sugar         Signed, Leanor Kail, [place]  02/19/2022 11:15 AM    [place]

## 2022-02-19 NOTE — Progress Notes (Deleted)
Cardiology Office Note Date:  02/19/2022  Patient ID:  Sabrina, [name] May 02, 1946, MRN [id number] PCP:  Sharion Balloon, FNP  Cardiologist:  Dr. Harl Bowie Electrophysiologist: Dr. Lovena Le Structural: Dr. Quentin Ore  ***refresh   Chief Complaint: *** hospital f/u ??  History of Present Illness: Sabrina [name] is a 76 y.o. female with history of COPD, GERD, HTN, DVT (IVC filter > removed 06/20/21), SND w/PPM, AFib, GIB  She underwent LAAO closure with a 24 mm Watchman FLX device by Dr. Quentin Ore 08/01/21 F/u with structural team APP 01/13/22, planned to stop Plavix 01/29/22 and ASA alone thereafter.  She was seen at [facility] ER and transferred to [facility] 02/05/22 - 02/07/22 with c/o CP,  hsTn 200>>311>>309>>361>>320>>134. BNP elevated to 998.0. CXR showed cardiomegaly and vascular congestion. Creatinine 1.14. Patient was transported to [facility] for further evaluation.  Had cath without obstructive CAD, TTE with preserved LVEF. A PRN lasix added to her home regime   *** appt with Vin? *** ?GI, bleeding? Nov 2022 Hgb 12.4 > 10.2 and 9.6 *** rate   Device information MDT dual chamber PPM implanted 08/27/2017   Past Medical History:  Diagnosis Date   Anemia    Ankle fracture, right    past hx. -"no surgery"   Anxiety    Asthma    CHF (congestive heart failure) ([place]) 2009   Chronic lower back pain    Collagen vascular disease (HCC)    COPD (chronic obstructive pulmonary disease) (HCC)    Depression    Fibromyalgia    GERD (gastroesophageal reflux disease)    Hyperlipidemia    Hypertension    Immature cataract of both eyes    Myocardial infarction ([place])    "I've had a light one; don't know when it happened" (08/27/2017)   Osteoarthritis    Peripheral neuropathy    legs and feet   Persistent atrial fibrillation ([place])    Presence of Watchman left atrial appendage closure device 08/01/2021   24 mm Watchman FLXDevice LOT # [id number] by Dr. Quentin Ore   Tremors of nervous  system    noted in hands by pt last 6 months   Tubular adenoma of colon     Past Surgical History:  Procedure Laterality Date   APPENDECTOMY     AV NODE ABLATION N/A 08/27/2017   Procedure: AV NODE ABLATION;  Surgeon: Evans Lance, MD;  Location: [place] [facility];  Service: Cardiovascular;  Laterality: N/A;   CHOLECYSTECTOMY OPEN  1978   DILATION AND CURETTAGE OF UTERUS     FEMUR FRACTURE SURGERY Left 2013   "put 7" rod in it"   FRACTURE SURGERY     IVC FILTER REMOVAL N/A 06/20/2021   Procedure: IVC FILTER REMOVAL;  Surgeon: Marty Heck, MD;  Location: [place] [facility];  Service: Cardiovascular;  Laterality: N/A;   LAPAROSCOPY  08/22/2016   Procedure: LAPAROSCOPY DIAGNOSTIC;  Surgeon: [name], MD;  Location: [facility];  Service: General;;   LEFT ATRIAL APPENDAGE OCCLUSION N/A 08/01/2021   Procedure: LEFT ATRIAL APPENDAGE OCCLUSION;  Surgeon: Vickie Epley, MD;  Location: [place] [facility];  Service: Cardiovascular;  Laterality: N/A;   LEFT HEART CATH AND CORONARY ANGIOGRAPHY N/A 02/06/2022   Procedure: LEFT HEART CATH AND CORONARY ANGIOGRAPHY;  Surgeon: Troy Sine, MD;  Location: [place] [facility];  Service: Cardiovascular;  Laterality: N/A;   MEDIAL PARTIAL KNEE REPLACEMENT Right 2005   "@ [facility]"   PACEMAKER IMPLANT N/A 08/27/2017  Procedure: PACEMAKER IMPLANT;  Surgeon: Evans Lance, MD;  Location: [place] [facility];  Service: Cardiovascular;  Laterality: N/A;   ROUX-EN-Y GASTRIC BYPASS  2002   [place]  2002   TEE WITHOUT CARDIOVERSION N/A 08/01/2021   Procedure: TRANSESOPHAGEAL ECHOCARDIOGRAM (TEE);  Surgeon: Vickie Epley, MD;  Location: [place] [facility];  Service: Cardiovascular;  Laterality: N/A;   TONSILLECTOMY  1944   TUBAL LIGATION     VAGINAL HYSTERECTOMY      Current Outpatient Medications  Medication Sig Dispense Refill   albuterol (VENTOLIN HFA) 108 (90 Base) MCG/ACT inhaler INHALE 2 PUFFS BY  MOUTH EVERY 6 HOURS AS NEEDED FOR WHEEZE OR SHORTNESS OF BREATH 1 each 1   alendronate (FOSAMAX) 70 MG tablet Take 1 tablet (70 mg total) by mouth every 7 (seven) days. Take with a full glass of water on an empty stomach. 4 tablet 11   aspirin EC 81 MG EC tablet Take 1 tablet (81 mg total) by mouth daily. Swallow whole. 30 tablet 11   atorvastatin (LIPITOR) 20 MG tablet Take 1 tablet (20 mg total) by mouth at bedtime. 90 tablet 1   blood glucose meter kit and supplies Dispense based on patient and insurance preference. Use up to four times daily as directed. (FOR ICD-10 E10.9, E11.9). 1 each 0   Blood Glucose Monitoring Suppl (TRUE METRIX AIR GLUCOSE METER) w/Device KIT Test BS daily Dx R73.09 1 kit 0   Cholecalciferol (PA VITAMIN D-3) 50 MCG (2000 UT) CAPS Take 2,000 Units by mouth in the morning.     cromolyn (NASALCROM) 5.2 MG/ACT nasal spray Place 1 spray into both nostrils 2 (two) times daily as needed for allergies.     cyanocobalamin (,VITAMIN B-12,) 1000 MCG/ML injection Inject 1,000 mcg into the muscle every 30 (thirty) days.     Cyanocobalamin (VITAMIN B-12) 5000 MCG SUBL Place 5,000 mcg under the tongue in the morning.     DULoxetine (CYMBALTA) 60 MG capsule TAKE 1 CAPSULE EVERY DAY 30 capsule 4   ferrous sulfate 325 (65 FE) MG tablet Take 325 mg by mouth once a week.     fluticasone (FLONASE) 50 MCG/ACT nasal spray SPRAY 2 SPRAYS INTO EACH NOSTRIL EVERY DAY (Patient taking differently: 2 sprays daily as needed for allergies.) 48 g 1   furosemide (LASIX) 40 MG tablet Take 1-1.5 tablets (40-60 mg total) by mouth See admin instructions. Take 1.5 tablets (60 mg) by mouth once daily in the morning & may take an additional tablet (40 mg) in the evening if needed for fluid retention. 180 tablet 3   gabapentin (NEURONTIN) 100 MG capsule TAKE 1 CAPSULE TWICE DAILY (NEED MD APPOINTMENT) (Patient taking differently: 100 mg at bedtime.) 180 capsule 0   glucose blood (TRUE METRIX BLOOD GLUCOSE TEST)  test strip Test BS daily Dx R73.09 100 each 3   Multiple Vitamin (MULTIVITAMIN WITH MINERALS) TABS tablet Take 1 tablet by mouth in the morning.     nitroGLYCERIN (NITROSTAT) 0.4 MG SL tablet Place 1 tablet (0.4 mg total) under the tongue every 5 (five) minutes x 3 doses as needed for chest pain. 25 tablet 12   Omega-3 Fatty Acids (FISH OIL PO) Take 1 capsule by mouth daily.     pantoprazole (PROTONIX) 40 MG tablet TAKE 1 TABLET TWICE DAILY 180 tablet 0   potassium chloride SA (KLOR-CON M) 20 MEQ tablet Take 1 table in every morning. Take one table in evening only (as  needed) when taking lasix in evening 90 tablet 3   SYMBICORT 80-4.5 MCG/ACT inhaler Inhale 2 puffs into the lungs in the morning.     triamcinolone ointment (KENALOG) 0.5 % Apply 1 application topically 2 (two) times daily. (Patient taking differently: Apply 1 application. topically 2 (two) times daily as needed (rash).) 30 g 0   TRUEplus Lancets 33G MISC Test BS daily Dx R73.09 100 each 3   vitamin C (ASCORBIC ACID) 500 MG tablet Take 500 mg by mouth daily.     Vitamin D, Ergocalciferol, (DRISDOL) 1.25 MG (50000 UNIT) CAPS capsule TAKE 1 CAPSULE (50,000 UNITS TOTAL) BY MOUTH EVERY 7 (SEVEN) DAYS. 12 capsule 0   No current facility-administered medications for this visit.    Allergies:   Codeine   Social History:  The patient  reports that she quit smoking about 29 years ago. Her smoking use included cigarettes. She has a 12.50 pack-year smoking history. She has never used smokeless tobacco. She reports that she does not currently use alcohol. She reports that she does not currently use drugs.   Family History:  The patient's family history includes Alzheimer's disease in her mother; Arthritis in her daughter, father, son, and son; Asthma in her daughter; Cancer in her mother; Colon cancer (age of onset: 65) in her paternal uncle; Heart disease in her mother; Hyperlipidemia in her son; Obesity in her daughter, son, and son.  ROS:   Please see the history of present illness.    All other systems are reviewed and otherwise negative.   PHYSICAL EXAM:  VS:  There were no vitals taken for this visit. BMI: There is no height or weight on file to calculate BMI. Well nourished, well developed, in no acute distress HEENT: normocephalic, atraumatic Neck: no JVD, carotid bruits or masses Cardiac:  *** RRR; no significant murmurs, no rubs, or gallops Lungs:  *** CTA b/l, no wheezing, rhonchi or rales Abd: soft, nontender MS: no deformity or *** atrophy Ext: *** no edema Skin: warm and dry, no rash Neuro:  No gross deficits appreciated Psych: euthymic mood, full affect  *** PPM site is stable, no tethering or discomfort   EKG:  Done today and reviewed by myself shows  ***  Device interrogation done today and reviewed by myself:  ***  02/06/22: TTE  1. Left ventricular ejection fraction, by estimation, is 60 to 65%. The  left ventricle has normal function. The left ventricle has no regional  wall motion abnormalities. Left ventricular diastolic parameters are  indeterminate.   2. Pacing wires in RA/RV . Right ventricular systolic function is normal.  The right ventricular size is normal. There is normal pulmonary artery  systolic pressure.   3. Left atrial size was moderately dilated.   4. Right atrial size was mildly dilated.   5. The mitral valve is abnormal. Mild mitral valve regurgitation. No  evidence of mitral stenosis.   6. The aortic valve is tricuspid. There is mild calcification of the  aortic valve. Aortic valve regurgitation is mild. Aortic valve sclerosis  is present, with no evidence of aortic valve stenosis.   7. The inferior vena cava is normal in size with greater than 50%  respiratory variability, suggesting right atrial pressure of 3 mmHg.    02/06/2022: LHC   Prox LAD lesion is 20% stenosed.   Mid LAD-1 lesion is 20% stenosed.   Mid LAD-2 lesion is 20% stenosed.   1st Mrg lesion is 20%  stenosed.   Prox RCA  lesion is 20% stenosed.   The left ventricular systolic function is normal.   LV end diastolic pressure is normal.   The left ventricular ejection fraction is 55-65% by visual estimate.   Three-vessel coronary calcification with mild nonobstructive CAD with a dominant RCA.  Normal LV function with EF estimated at 55 to 65%; LVEDP 12 mmHg.   RECOMMENDATION: Medical therapy for multivessel nonobstructive CAD.  Aspirin for antiplatelet benefit.  Aggressive lipid-lowering therapy with target LDL less than 70.   LAAO Watchman implant 08/01/21:  Transeptal Puncture Intra-procedural TEE which showed no LAA thrombus Left atrial appendage occlusive device placement on 08/01/21 by Dr. Quentin Ore.  Device Implant: 24 mm Watchman FLXDevice LOT # [id number] Conclusion: Successful implantation of a 24 mm Watchman left atrial appendage occlusion device using fluoroscopic and transesophageal echo guidance Post Implant Anticoagulation Strategy: Aspirin 49m PO daily and Apixaban 2.570mPO BID x 45 days. If follow up TEE demonstrates well-seated device, transition to Aspirin 8118mO daily and Plavix 61m60m daily to complete 6 months total therapy post implant. After 6 months, transition to Aspirin 81mg52mdaily.   IVC filter explant: 06/20/2021 Pre-operative Diagnosis: IVC filter Post-operative diagnosis:  Same Surgeon:  ChrisMarty HeckProcedure Performed: 1.  Ultrasound-guided access right internal jugular vein 2.  Inferior venacavogram 3.  Removal of IVC filter 4.  26 minutes of monitored moderate conscious sedation time   Echocardiogram 05/31/21: Left ventricular ejection fraction, by estimation, is 60 to 65%. The left ventricle has normal function. The left ventricle has no regional wall motion abnormalities. Left ventricular diastolic parameters are indeterminate. 1. 2. Right ventricular systolic function is normal. The right ventricular size is normal. 3. Left  atrial size was mildly dilated. 4. Right atrial size was mildly dilated. The mitral valve is abnormal. Mild mitral valve regurgitation. No evidence of mitral stenosis. 5. The aortic valve is tricuspid. There is mild calcification of the aortic valve. There is mild thickening of the aortic valve. Aortic valve regurgitation is mild. No aortic stenosis is present. 6. The inferior vena cava is normal in size with greater than 50% respiratory variability, suggesting right atrial pressure of 3 mmHg.  Recent Labs: 02/05/2022: ALT 95; TSH 1.734 02/07/2022: BUN 14; Creatinine, Ser 0.78; Hemoglobin 9.6; Platelets 283; Potassium 3.5; Sodium 140  02/06/2022: Cholesterol 119; HDL 54; LDL Cholesterol 51; Total CHOL/HDL Ratio 2.2; Triglycerides 69; VLDL 14   Estimated Creatinine Clearance: 58.4 mL/min (by C-G formula based on SCr of 0.78 mg/dL).   Wt Readings from Last 3 Encounters:  02/06/22 149 lb 14.6 oz (68 kg)  01/13/22 152 lb (68.9 kg)  08/19/21 155 lb 12.8 oz (70.7 kg)     Other studies reviewed: Additional studies/records reviewed today include: summarized above  ASSESSMENT AND PLAN:  ***  Disposition: F/u with ***  Current medicines are reviewed at length with the patient today.  The patient did not have any concerns regarding medicines.  SigneVenetia [name] 02/19/2022 8:27 AM     [facility] [place] [phone])  [phone])

## 2022-02-19 NOTE — Patient Instructions (Signed)
Medication Instructions:  Your physician recommends that you continue on your current medications as directed. Please refer to the Current Medication list given to you today.  *If you need a refill on your cardiac medications before your next appointment, please call your pharmacy*   Lab Work: Lab work to be done today--BMP If you have labs (blood work) drawn today and your tests are completely normal, you will receive your results only by: [place] (if you have MyChart) OR A paper copy in the mail If you have any lab test that is abnormal or we need to change your treatment, we will call you to review the results.   Testing/Procedures: none   Follow-Up: At [facility], you and your health needs are our priority.  As part of our continuing mission to provide you with exceptional heart care, we have created designated Provider Care Teams.  These Care Teams include your primary Cardiologist (physician) and Advanced Practice Providers (APPs -  Physician Assistants and Nurse Practitioners) who all work together to provide you with the care you need, when you need it.  We recommend signing up for the patient portal called "MyChart".  Sign up information is provided on this After Visit Summary.  MyChart is used to connect with patients for Virtual Visits (Telemedicine).  Patients are able to view lab/test results, encounter notes, upcoming appointments, etc.  Non-urgent messages can be sent to your provider as well.   To learn more about what you can do with MyChart, go to [email or web address].    Your next appointment:   May 01, 2022  The format for your next appointment:   In Person  Provider:   Dr Domenic Polite in [facility]    Other [place]

## 2022-02-20 ENCOUNTER — Other Ambulatory Visit: Payer: Self-pay

## 2022-02-20 ENCOUNTER — Encounter: Payer: Medicare HMO | Admitting: Physician Assistant

## 2022-02-20 MED ORDER — POTASSIUM CHLORIDE CRYS ER 20 MEQ PO TBCR: 20.0000 meq | EXTENDED_RELEASE_TABLET | Freq: Two times a day (BID) | ORAL | 1 refills | Status: DC

## 2022-02-20 MED ORDER — FUROSEMIDE 40 MG PO TABS: 40.0000 mg | ORAL_TABLET | ORAL | 1 refills | Status: DC

## 2022-02-26 ENCOUNTER — Other Ambulatory Visit: Payer: Self-pay | Admitting: *Deleted

## 2022-02-26 ENCOUNTER — Ambulatory Visit (INDEPENDENT_AMBULATORY_CARE_PROVIDER_SITE_OTHER): Payer: Medicare HMO

## 2022-02-26 ENCOUNTER — Ambulatory Visit (INDEPENDENT_AMBULATORY_CARE_PROVIDER_SITE_OTHER): Payer: Medicare HMO | Admitting: Emergency Medicine

## 2022-02-26 VITALS — Ht 64.0 in | Wt 147.0 lb

## 2022-02-26 DIAGNOSIS — Z0000 Encounter for general adult medical examination without abnormal findings: Secondary | ICD-10-CM

## 2022-02-26 DIAGNOSIS — E538 Deficiency of other specified B group vitamins: Secondary | ICD-10-CM | POA: Diagnosis not present

## 2022-02-26 MED ORDER — CYANOCOBALAMIN 1000 MCG/ML IJ SOLN
1000.0000 ug | Freq: Once | INTRAMUSCULAR | Status: AC
  Administered 2022-02-26: 1000 ug via INTRAMUSCULAR

## 2022-02-26 MED ORDER — POTASSIUM CHLORIDE CRYS ER 20 MEQ PO TBCR: 20.0000 meq | EXTENDED_RELEASE_TABLET | Freq: Two times a day (BID) | ORAL | 1 refills | Status: AC

## 2022-02-26 NOTE — Progress Notes (Signed)
Subjective:   Sabrina [name] is a 76 y.o. female who presents for Medicare Annual (Subsequent) preventive examination. Virtual Visit via Telephone Note  I connected with  Sabrina [name] on 02/26/22 at 10:30 AM EDT by telephone and verified that I am speaking with the correct person using two identifiers.  Location: Patient: HOME Provider: [facility] Persons participating in the virtual visit: patient/Nurse Health Advisor   I discussed the limitations, risks, security and privacy concerns of performing an evaluation and management service by telephone and the availability of in person appointments. The patient expressed understanding and agreed to proceed.  Interactive audio and video telecommunications were attempted between this nurse and patient, however failed, due to patient having technical difficulties OR patient did not have access to video capability.  We continued and completed visit with audio only.  Some vital signs may be absent or patient reported.   Chriss Driver, LPN  Review of Systems     Cardiac Risk Factors include: advanced age (>63mn, >>50women);dyslipidemia;hypertension;sedentary lifestyle;Other (see comment), Risk factor comments: Afib, DVT, Fibromyalgia     Objective:    Today's Vitals   02/26/22 1032  Weight: 147 lb (66.7 kg)  Height: _0  (1.626 m)   Body mass index is 25.23 kg/m.     02/26/2022   10:40 AM 02/07/2022    9:00 AM 06/20/2021    8:29 AM 06/05/2021    2:32 PM 02/25/2021   10:43 AM 02/11/2021   12:19 PM 01/08/2021    2:10 PM  Advanced Directives  Does Patient Have a Medical Advance Directive? Yes No Yes No No No No  Type of AParamedicof ACenter PointLiving will  HMilton Centerin Chart? No - copy requested        Would patient like information on creating a medical advance directive? No - Patient declined No - Patient declined   Yes  (MAU/Ambulatory/Procedural Areas - Information given)  No - Patient declined    Current Medications (verified) Outpatient Encounter Medications as of 02/26/2022  Medication Sig   albuterol (VENTOLIN HFA) 108 (90 Base) MCG/ACT inhaler INHALE 2 PUFFS BY MOUTH EVERY 6 HOURS AS NEEDED FOR WHEEZE OR SHORTNESS OF BREATH   alendronate (FOSAMAX) 70 MG tablet Take 1 tablet (70 mg total) by mouth every 7 (seven) days. Take with a full glass of water on an empty stomach.   aspirin EC 81 MG EC tablet Take 1 tablet (81 mg total) by mouth daily. Swallow whole.   atorvastatin (LIPITOR) 20 MG tablet Take 1 tablet (20 mg total) by mouth at bedtime. (Patient taking differently: Take 20 mg by mouth at bedtime. Per patient taking 40 mg of the 20 mg tablets)   blood glucose meter kit and supplies Dispense based on patient and insurance preference. Use up to four times daily as directed. (FOR ICD-10 E10.9, E11.9).   Blood Glucose Monitoring Suppl (TRUE METRIX AIR GLUCOSE METER) w/Device KIT Test BS daily Dx R73.09   Cholecalciferol (PA VITAMIN D-3) 50 MCG (2000 UT) CAPS Take 2,000 Units by mouth in the morning.   cromolyn (NASALCROM) 5.2 MG/ACT nasal spray Place 1 spray into both nostrils 2 (two) times daily as needed for allergies.   cyanocobalamin (,VITAMIN B-12,) 1000 MCG/ML injection Inject 1,000 mcg into the muscle every 30 (thirty) days.   Cyanocobalamin (VITAMIN B-12) 5000 MCG SUBL Place 5,000 mcg under the tongue in the morning.  DULoxetine (CYMBALTA) 60 MG capsule (NEEDS TO BE SEEN BEFORE NEXT REFILL) TAKE 1 CAPSULE EVERY DAY   ferrous sulfate 325 (65 FE) MG tablet Take 325 mg by mouth once a week.   fluticasone (FLONASE) 50 MCG/ACT nasal spray SPRAY 2 SPRAYS INTO EACH NOSTRIL EVERY DAY (Patient taking differently: 2 sprays daily as needed for allergies.)   furosemide (LASIX) 40 MG tablet Take 1-1.5 tablets (40-60 mg total) by mouth See admin instructions. Take 1.5 tablets (60 mg) by mouth once daily in the  morning & may take an additional tablet (40 mg) in the evening if needed for fluid retention.   gabapentin (NEURONTIN) 100 MG capsule TAKE 1 CAPSULE TWICE DAILY (NEED MD APPOINTMENT) (Patient taking differently: 100 mg at bedtime.)   glucose blood (TRUE METRIX BLOOD GLUCOSE TEST) test strip Test BS daily Dx R73.09   Multiple Vitamin (MULTIVITAMIN WITH MINERALS) TABS tablet Take 1 tablet by mouth in the morning.   nitroGLYCERIN (NITROSTAT) 0.4 MG SL tablet Place 1 tablet (0.4 mg total) under the tongue every 5 (five) minutes x 3 doses as needed for chest pain.   Omega-3 Fatty Acids (FISH OIL PO) Take 1 capsule by mouth daily.   pantoprazole (PROTONIX) 40 MG tablet TAKE 1 TABLET TWICE DAILY   potassium chloride SA (KLOR-CON M) 20 MEQ tablet Take 1 tablet (20 mEq total) by mouth 2 (two) times daily. Take 1 tablet twice a day   SYMBICORT 80-4.5 MCG/ACT inhaler Inhale 2 puffs into the lungs in the morning.   triamcinolone ointment (KENALOG) 0.5 % Apply 1 application topically 2 (two) times daily. (Patient taking differently: Apply 1 application. topically 2 (two) times daily as needed (rash).)   TRUEplus Lancets 33G MISC Test BS daily Dx R73.09   vitamin C (ASCORBIC ACID) 500 MG tablet Take 500 mg by mouth daily.   Vitamin D, Ergocalciferol, (DRISDOL) 1.25 MG (50000 UNIT) CAPS capsule TAKE 1 CAPSULE (50,000 UNITS TOTAL) BY MOUTH EVERY 7 (SEVEN) DAYS.   [DISCONTINUED] potassium chloride SA (KLOR-CON M) 20 MEQ tablet Take 1 tablet (20 mEq total) by mouth 2 (two) times daily. Take 1 tablet twice a day   No facility-administered encounter medications on file as of 02/26/2022.    Allergies (verified) Codeine   History: Past Medical History:  Diagnosis Date   Anemia    Ankle fracture, right    past hx. -"no surgery"   Anxiety    Asthma    CHF (congestive heart failure) ([place]) 2009   Chronic lower back pain    Collagen vascular disease (HCC)    COPD (chronic obstructive pulmonary disease) (HCC)     Depression    Fibromyalgia    GERD (gastroesophageal reflux disease)    Hyperlipidemia    Hypertension    Immature cataract of both eyes    Myocardial infarction ([place])    "I've had a light one; don't know when it happened" (08/27/2017)   Osteoarthritis    Peripheral neuropathy    legs and feet   Persistent atrial fibrillation ([place])    Presence of Watchman left atrial appendage closure device 08/01/2021   24 mm Watchman FLXDevice LOT # [id number] by Dr. Quentin Ore   Tremors of nervous system    noted in hands by pt last 6 months   Tubular adenoma of colon    Past Surgical History:  Procedure Laterality Date   APPENDECTOMY     AV NODE ABLATION N/A 08/27/2017   Procedure: AV NODE ABLATION;  Surgeon: Evans Lance,  MD;  Location: [facility];  Service: Cardiovascular;  Laterality: N/A;   CHOLECYSTECTOMY OPEN  1978   DILATION AND CURETTAGE OF UTERUS     FEMUR FRACTURE SURGERY Left 2013   "put 7" rod in it"   FRACTURE SURGERY     IVC FILTER REMOVAL N/A 06/20/2021   Procedure: IVC FILTER REMOVAL;  Surgeon: Marty Heck, MD;  Location: [facility];  Service: Cardiovascular;  Laterality: N/A;   LAPAROSCOPY  08/22/2016   Procedure: LAPAROSCOPY DIAGNOSTIC;  Surgeon: [name], MD;  Location: [facility];  Service: General;;   LEFT ATRIAL APPENDAGE OCCLUSION N/A 08/01/2021   Procedure: LEFT ATRIAL APPENDAGE OCCLUSION;  Surgeon: Vickie Epley, MD;  Location: [place];  Service: Cardiovascular;  Laterality: N/A;   LEFT HEART CATH AND CORONARY ANGIOGRAPHY N/A 02/06/2022   Procedure: LEFT HEART CATH AND CORONARY ANGIOGRAPHY;  Surgeon: Troy Sine, MD;  Location: [facility];  Service: Cardiovascular;  Laterality: N/A;   MEDIAL PARTIAL KNEE REPLACEMENT Right 2005   "@ [facility]"   PACEMAKER IMPLANT N/A 08/27/2017   Procedure: PACEMAKER IMPLANT;  Surgeon: Evans Lance, MD;  Location: [facility];  Service: Cardiovascular;  Laterality: N/A;   ROUX-EN-Y  GASTRIC BYPASS  2002   [place]  2002   TEE WITHOUT CARDIOVERSION N/A 08/01/2021   Procedure: TRANSESOPHAGEAL ECHOCARDIOGRAM (TEE);  Surgeon: Vickie Epley, MD;  Location: [facility];  Service: Cardiovascular;  Laterality: N/A;   TONSILLECTOMY  1944   TUBAL LIGATION     VAGINAL HYSTERECTOMY     Family History  Problem Relation Age of Onset   Cancer Mother    Alzheimer's disease Mother    Heart disease Mother    Arthritis Father    Asthma Daughter    Arthritis Daughter    Obesity Daughter    Colon cancer Paternal Uncle 67   Arthritis Son    Hyperlipidemia Son    Obesity Son    Arthritis Son    Obesity Son    BRCA 1/2 Neg Hx    Social History   Socioeconomic History   Marital status: Divorced    Spouse name: Not on file   Number of children: 3   Years of education: 2 years of college   Highest education level: Some college, no degree  Occupational History   Occupation: Retired  Tobacco Use   Smoking status: Former    Packs/day: 0.50    Years: 25.00    Pack years: 12.50    Types: Cigarettes    Quit date: 02/26/1993    Years since quitting: 29.0   Smokeless tobacco: Never  Vaping Use   Vaping Use: Never used  Substance and Sexual Activity   Alcohol use: Not Currently    Comment: 08/27/2017 "nothing since early 2000s"   Drug use: Not Currently   Sexual activity: Not Currently    Birth control/protection: Surgical  Other Topics Concern   Not on file  Social History Narrative   ** Merged History Encounter **       Pt is right handed Lives in single story home with her grandson Has 3 adult children Associated degree  Retired Quarry manager   Social Determinants of Radio broadcast assistant Strain: Low Risk    Difficulty of Paying Living Expenses: Not hard at all  Food Insecurity: No Food Insecurity   Worried About Charity fundraiser in the Last  Year: Never true   [place] in the Last Year: Never true   Transportation Needs: No Transportation Needs   Lack of Transportation (Medical): No   Lack of Transportation (Non-Medical): No  Physical Activity: Sufficiently Active   Days of Exercise per Week: 4 days   Minutes of Exercise per Session: 60 min  Stress: No Stress Concern Present   Feeling of Stress : Not at all  Social Connections: Moderately Integrated   Frequency of Communication with Friends and Family: More than three times a week   Frequency of Social Gatherings with Friends and Family: More than three times a week   Attends Religious Services: More than 4 times per year   Active Member of [employer] or Organizations: Yes   Attends Music therapist: More than 4 times per year   Marital Status: Divorced    Tobacco Counseling Counseling given: Not Answered   Clinical Intake:  Pre-visit preparation completed: Yes  Pain : No/denies pain     BMI - recorded: 25.23 Nutritional Status: BMI 25 -29 Overweight Nutritional Risks: None Diabetes: No  How often do you need to have someone help you when you read instructions, pamphlets, or other written materials from your doctor or pharmacy?: 1 - Never  Diabetic?NO  Interpreter Needed?: No  Information entered by :: mj Naama [place], lpn   Activities of Daily Living    02/26/2022   10:42 AM 02/07/2022    8:00 AM  In your present state of health, do you have any difficulty performing the following activities:  Hearing? 0 0  Vision? 0 0  Difficulty concentrating or making decisions? 0 0  Walking or climbing stairs? 0 0  Dressing or bathing? 0 0  Doing errands, shopping? 0 0  Preparing Food and eating ? N   Using the Toilet? N   In the past six months, have you accidently leaked urine? N   Do you have problems with loss of bowel control? N   Managing your Medications? N   Managing your Finances? N   Housekeeping or managing your Housekeeping? N     Patient Care Team: Sharion Balloon, FNP as PCP - General (Family  Medicine) Evans Lance, MD as PCP - Electrophysiology (Cardiology) Harl Bowie Alphonse Guild, MD as PCP - Cardiology (Cardiology) Cameron Sprang, MD as Consulting Physician (Neurology)  Indicate any recent Medical Services you may have received from other than [facility] providers in the past year (date may be approximate).     Assessment:   This is a routine wellness examination for Sabrina [name].  Hearing/Vision screen Hearing Screening - Comments:: No hearing issues.  Vision Screening - Comments:: Glasses [facility] 2022.  Dietary issues and exercise activities discussed: Current Exercise Habits: Structured exercise class, Type of exercise: strength training/weights;stretching;Other - see comments (Recumbant bike.), Time (Minutes): 60, Frequency (Times/Week): 4, Weekly Exercise (Minutes/Week): 240, Intensity: Mild, Exercise limited by: cardiac condition(s);orthopedic condition(s)   Goals Addressed             This Visit's Progress    DIET - INCREASE WATER INTAKE   On track    Increase physical activity   On track    Pt has started going back to the [facility] [employer] 3 days a week and would like to go at least 5 days       Depression Screen    02/26/2022   10:36 AM 02/11/2022    2:38 PM 08/08/2021   10:29 AM 05/07/2021  10:32 AM 05/07/2021   10:06 AM 02/25/2021   10:40 AM 01/24/2021    9:38 AM  PHQ 2/9 Scores  PHQ - 2 Score 1 1 0 _0 PHQ- 9 Score 1 4 0 _1 Fall Risk    02/26/2022   10:41 AM 02/11/2022    2:38 PM 08/08/2021   10:29 AM 06/05/2021    2:32 PM 05/07/2021   10:05 AM  Fall Risk   Falls in the past year? 1 0 0 0 1  Number falls in past yr: 0    1  Injury with Fall? 0    0  Risk for fall due to : History of fall(s);Impaired balance/gait    History of fall(s)  Follow up Falls prevention discussed Falls evaluation completed   Education provided    [place]:  Any stairs in or around the home? Yes  If so, are there any without  handrails? No  Home free of loose throw rugs in walkways, pet beds, electrical cords, etc? Yes  Adequate lighting in your home to reduce risk of falls? Yes   ASSISTIVE DEVICES UTILIZED TO PREVENT FALLS:  Life alert? Yes  Use of a cane, walker or w/c? No  Grab bars in the bathroom? No  Shower chair or bench in shower? Yes  Elevated toilet seat or a handicapped toilet? No   TIMED UP AND GO:  Was the test performed? No .  PHONE VISIT.  Cognitive Function:        02/26/2022   10:44 AM 02/25/2021   10:54 AM 01/31/2020   11:24 AM 02/09/2019    9:01 AM  6CIT Screen  What Year? 0 points 0 points 0 points 0 points  What month? 0 points 0 points 0 points 0 points  What time? 0 points 0 points 0 points 0 points  Count back from 20 0 points 0 points 0 points 0 points  Months in reverse 0 points 2 points 0 points 0 points  Repeat phrase 0 points 2 points 2 points 0 points  Total Score 0 points 4 points 2 points 0 points    Immunizations Immunization History  Administered Date(s) Administered   Fluad Quad(high Dose 65+) 06/14/2021   Influenza, High Dose Seasonal PF 07/14/2017, 06/29/2018, 06/19/2019, 08/01/2020   Influenza,inj,Quad PF,6+ Mos 06/21/2014, 08/28/2015, 06/14/2016   Moderna Sars-Covid-2 Vaccination 11/23/2019, 12/21/2019, 05/24/2020   Pneumococcal Conjugate-13 10/19/2015   Pneumococcal Polysaccharide-23 06/14/2016, 06/19/2019   Tdap 05/19/2007, 06/29/2018   Zoster Recombinat (Shingrix) 08/01/2020, 05/07/2021   Zoster, Live 05/19/2014    TDAP status: Up to date  Flu Vaccine status: Up to date  Pneumococcal vaccine status: Up to date  Covid-19 vaccine status: Completed vaccines  Qualifies for Shingles Vaccine? Yes   Zostavax completed Yes   Shingrix Completed?: Yes  Screening Tests Health Maintenance  Topic Date Due   URINE MICROALBUMIN  Never done   COVID-19 Vaccine (4 - Booster for Moderna series) 02/27/2022 (Originally 07/19/2020)   INFLUENZA VACCINE   04/22/2022   MAMMOGRAM  12/26/2022   DEXA SCAN  01/09/2024   COLONOSCOPY (Pts 45-47yr Insurance coverage will need to be confirmed)  02/06/2027   TETANUS/TDAP  06/29/2028   Pneumonia Vaccine 75 Years old  Completed   Hepatitis C Screening  Completed   Zoster Vaccines- Shingrix  Completed   HPV VACCINES  Aged Out    Health Maintenance  Health Maintenance Due  Topic Date  Due   URINE MICROALBUMIN  Never done    Colorectal cancer screening: Type of screening: Colonoscopy. Completed 02/05/2022. Repeat every 5 years  Mammogram status: Completed 12/25/2021. Repeat every year  Bone Density status: Completed 01/08/2022. Results reflect: Bone density results: OSTEOPOROSIS. Repeat every 2 years.  Lung Cancer Screening: (Low Dose CT Chest recommended if Age 7-80 years, 30 pack-year currently smoking OR have quit w/in 15years.) does not qualify.   Additional Screening:  Hepatitis C Screening: does qualify; Completed 10/19/2015  Vision Screening: Recommended annual ophthalmology exams for early detection of glaucoma and other disorders of the eye. Is the patient up to date with their annual eye exam?  Yes  Who is the provider or what is the name of the office in which the patient attends annual eye exams? [facility] If pt is not established with a provider, would they like to be referred to a provider to establish care? No .   Dental Screening: Recommended annual dental exams for proper oral hygiene  Community Resource Referral / Chronic Care Management: CRR required this visit?  No   CCM required this visit?  No      Plan:     I have personally reviewed and noted the following in the patient's chart:   Medical and social history Use of alcohol, tobacco or illicit drugs  Current medications and supplements including opioid prescriptions.  Functional ability and status Nutritional status Physical activity Advanced directives List of other physicians Hospitalizations,  surgeries, and ER visits in previous 12 months Vitals Screenings to include cognitive, depression, and falls Referrals and appointments  In addition, I have reviewed and discussed with patient certain preventive protocols, quality metrics, and best practice recommendations. A written personalized care plan for preventive services as well as general preventive health recommendations were provided to patient.     Chriss Driver, LPN   11/28/9371   Nurse Notes: Discussed Colonoscopy, pt states she had procedure done during hospitalization in May 2023. No results in chart.

## 2022-02-26 NOTE — Patient Instructions (Signed)
Sabrina [name] , Thank you for taking time to come for your Medicare Wellness Visit. I appreciate your ongoing commitment to your health goals. Please review the following plan we discussed and let me know if I can assist you in the future.   Screening recommendations/referrals: Colonoscopy: Done during recent hospital admission 01/2022. Mammogram: Done 12/25/2021 Repeat annually  Bone Density: Done 01/08/2022 Repeat every 2 years  Recommended yearly ophthalmology/optometry visit for glaucoma screening and checkup Recommended yearly dental visit for hygiene and checkup  Vaccinations: Influenza vaccine: Done 06/14/2021 Repeat annually  Pneumococcal vaccine: Done 10/19/2015 and 06/19/2019. Tdap vaccine: Done 06/29/2018 Repeat in 10 years  Shingles vaccine: Done 05/07/2021, 08/01/2020 and 05/19/2014.   Covid-19:Done 05/24/2020, 12/21/2019 and 11/23/2019.  Advanced directives: Please bring a copy of your health care power of attorney and living will to the office to be added to your chart at your convenience.   Conditions/risks identified: Aim for 30 minutes of exercise or brisk walking, 6-8 glasses of water, and 5 servings of fruits and vegetables each day. KEEP UP THE GOOD WORK!!!  Next appointment: Follow up in one year for your annual wellness visit 2024.   Preventive Care 15 Years and Older, Female Preventive care refers to lifestyle choices and visits with your health care provider that can promote health and wellness. What does preventive care include? A yearly physical exam. This is also called an annual well check. Dental exams once or twice a year. Routine eye exams. Ask your health care provider how often you should have your eyes checked. Personal lifestyle choices, including: Daily care of your teeth and gums. Regular physical activity. Eating a healthy diet. Avoiding tobacco and drug use. Limiting alcohol use. Practicing safe sex. Taking low-dose aspirin every day. Taking vitamin  and mineral supplements as recommended by your health care provider. What happens during an annual well check? The services and screenings done by your health care provider during your annual well check will depend on your age, overall health, lifestyle risk factors, and family history of disease. Counseling  Your health care provider may ask you questions about your: Alcohol use. Tobacco use. Drug use. Emotional well-being. Home and relationship well-being. Sexual activity. Eating habits. History of falls. Memory and ability to understand (cognition). Work and work Statistician. Reproductive health. Screening  You may have the following tests or measurements: Height, weight, and BMI. Blood pressure. Lipid and cholesterol levels. These may be checked every 5 years, or more frequently if you are over 6 years old. Skin check. Lung cancer screening. You may have this screening every year starting at age 58 if you have a 30-pack-year history of smoking and currently smoke or have quit within the past 15 years. Fecal occult blood test (FOBT) of the stool. You may have this test every year starting at age 56. Flexible sigmoidoscopy or colonoscopy. You may have a sigmoidoscopy every 5 years or a colonoscopy every 10 years starting at age 59. Hepatitis C blood test. Hepatitis B blood test. Sexually transmitted disease (STD) testing. Diabetes screening. This is done by checking your blood sugar (glucose) after you have not eaten for a while (fasting). You may have this done every 1-3 years. Bone density scan. This is done to screen for osteoporosis. You may have this done starting at age 92. Mammogram. This may be done every 1-2 years. Talk to your health care provider about how often you should have regular mammograms. Talk with your health care provider about your test results, treatment options,  and if necessary, the need for more tests. Vaccines  Your health care provider may recommend  certain vaccines, such as: Influenza vaccine. This is recommended every year. Tetanus, diphtheria, and acellular pertussis (Tdap, Td) vaccine. You may need a Td booster every 10 years. Zoster vaccine. You may need this after age 89. Pneumococcal 13-valent conjugate (PCV13) vaccine. One dose is recommended after age 28. Pneumococcal polysaccharide (PPSV23) vaccine. One dose is recommended after age 70. Talk to your health care provider about which screenings and vaccines you need and how often you need them. This information is not intended to replace advice given to you by your health care provider. Make sure you discuss any questions you have with your health care provider. Document Released: 10/05/2015 Document Revised: 05/28/2016 Document Reviewed: 07/10/2015 Elsevier Interactive Patient Education  2017 [employer] Prevention in the Home Falls can cause injuries. They can happen to people of all ages. There are many things you can do to make your home safe and to help prevent falls. What can I do on the outside of my home? Regularly fix the edges of walkways and driveways and fix any cracks. Remove anything that might make you trip as you walk through a door, such as a raised step or threshold. Trim any bushes or trees on the path to your home. Use bright outdoor lighting. Clear any walking paths of anything that might make someone trip, such as rocks or tools. Regularly check to see if handrails are loose or broken. Make sure that both sides of any steps have handrails. Any raised decks and porches should have guardrails on the edges. Have any leaves, snow, or ice cleared regularly. Use sand or salt on walking paths during winter. Clean up any spills in your garage right away. This includes oil or grease spills. What can I do in the bathroom? Use night lights. Install grab bars by the toilet and in the tub and shower. Do not use towel bars as grab bars. Use non-skid mats or  decals in the tub or shower. If you need to sit down in the shower, use a plastic, non-slip stool. Keep the floor dry. Clean up any water that spills on the floor as soon as it happens. Remove soap buildup in the tub or shower regularly. Attach bath mats securely with double-sided non-slip rug tape. Do not have throw rugs and other things on the floor that can make you trip. What can I do in the bedroom? Use night lights. Make sure that you have a light by your bed that is easy to reach. Do not use any sheets or blankets that are too big for your bed. They should not hang down onto the floor. Have a firm chair that has side arms. You can use this for support while you get dressed. Do not have throw rugs and other things on the floor that can make you trip. What can I do in the kitchen? Clean up any spills right away. Avoid walking on wet floors. Keep items that you use a lot in easy-to-reach places. If you need to reach something above you, use a strong step stool that has a grab bar. Keep electrical cords out of the way. Do not use floor polish or wax that makes floors slippery. If you must use wax, use non-skid floor wax. Do not have throw rugs and other things on the floor that can make you trip. What can I do with my stairs? Do not leave  any items on the stairs. Make sure that there are handrails on both sides of the stairs and use them. Fix handrails that are broken or loose. Make sure that handrails are as long as the stairways. Check any carpeting to make sure that it is firmly attached to the stairs. Fix any carpet that is loose or worn. Avoid having throw rugs at the top or bottom of the stairs. If you do have throw rugs, attach them to the floor with carpet tape. Make sure that you have a light switch at the top of the stairs and the bottom of the stairs. If you do not have them, ask someone to add them for you. What else can I do to help prevent falls? Wear shoes that: Do not  have high heels. Have rubber bottoms. Are comfortable and fit you well. Are closed at the toe. Do not wear sandals. If you use a stepladder: Make sure that it is fully opened. Do not climb a closed stepladder. Make sure that both sides of the stepladder are locked into place. Ask someone to hold it for you, if possible. Clearly mark and make sure that you can see: Any grab bars or handrails. First and last steps. Where the edge of each step is. Use tools that help you move around (mobility aids) if they are needed. These include: Canes. Walkers. Scooters. Crutches. Turn on the lights when you go into a dark area. Replace any light bulbs as soon as they burn out. Set up your furniture so you have a clear path. Avoid moving your furniture around. If any of your floors are uneven, fix them. If there are any pets around you, be aware of where they are. Review your medicines with your doctor. Some medicines can make you feel dizzy. This can increase your chance of falling. Ask your doctor what other things that you can do to help prevent falls. This information is not intended to replace advice given to you by your health care provider. Make sure you discuss any questions you have with your health care provider. Document Released: 07/05/2009 Document Revised: 02/14/2016 Document Reviewed: 10/13/2014 Elsevier Interactive Patient Education  2017 [employer].

## 2022-03-03 ENCOUNTER — Other Ambulatory Visit: Payer: Self-pay | Admitting: Family

## 2022-03-03 DIAGNOSIS — F411 Generalized anxiety disorder: Secondary | ICD-10-CM

## 2022-03-06 ENCOUNTER — Other Ambulatory Visit: Payer: Self-pay | Admitting: Family

## 2022-03-06 DIAGNOSIS — F411 Generalized anxiety disorder: Secondary | ICD-10-CM

## 2022-03-20 ENCOUNTER — Other Ambulatory Visit: Payer: Self-pay | Admitting: Cardiology

## 2022-03-20 ENCOUNTER — Telehealth: Payer: Self-pay | Admitting: Family

## 2022-03-20 DIAGNOSIS — F411 Generalized anxiety disorder: Secondary | ICD-10-CM

## 2022-03-20 MED ORDER — DULOXETINE HCL 60 MG PO CPEP: ORAL_CAPSULE | ORAL | 1 refills | Status: DC

## 2022-03-20 NOTE — Telephone Encounter (Signed)
Prescription sent to pharmacy.

## 2022-03-24 ENCOUNTER — Other Ambulatory Visit: Payer: Self-pay | Admitting: Family

## 2022-03-24 DIAGNOSIS — F411 Generalized anxiety disorder: Secondary | ICD-10-CM

## 2022-03-27 ENCOUNTER — Other Ambulatory Visit: Payer: Self-pay | Admitting: Family

## 2022-03-27 DIAGNOSIS — F411 Generalized anxiety disorder: Secondary | ICD-10-CM

## 2022-03-28 ENCOUNTER — Ambulatory Visit (INDEPENDENT_AMBULATORY_CARE_PROVIDER_SITE_OTHER): Payer: Medicare HMO

## 2022-03-28 DIAGNOSIS — I442 Atrioventricular block, complete: Secondary | ICD-10-CM | POA: Diagnosis not present

## 2022-03-28 MED ORDER — DULOXETINE HCL 60 MG PO CPEP: 60.0000 mg | ORAL_CAPSULE | Freq: Every day | ORAL | 0 refills | Status: DC

## 2022-03-28 NOTE — Addendum Note (Signed)
Addended by: Antonietta Barcelona D on: 03/28/2022 09:03 AM   Modules accepted: Orders

## 2022-03-28 NOTE — Telephone Encounter (Signed)
Refill failed. resent °

## 2022-03-29 LAB — CUP PACEART REMOTE DEVICE CHECK
Battery Remaining Longevity: 51 mo
Battery Voltage: 2.89 V
Brady Statistic AP VP Percent: 0.49 %
Brady Statistic AP VS Percent: 99.44 %
Brady Statistic AS VP Percent: 0 %
Brady Statistic AS VS Percent: 0.06 %
Brady Statistic RA Percent Paced: 100 %
Brady Statistic RV Percent Paced: 0.49 %
Date Time Interrogation Session: 20230707021231
Implantable Lead Implant Date: 20181206
Implantable Lead Implant Date: 20181206
Implantable Lead Location: 753860
Implantable Lead Location: 753860
Implantable Lead Model: 3830
Implantable Lead Model: 5076
Implantable Pulse Generator Implant Date: 20181206
Lead Channel Impedance Value: 285 Ohm
Lead Channel Impedance Value: 342 Ohm
Lead Channel Impedance Value: 418 Ohm
Lead Channel Impedance Value: 456 Ohm
Lead Channel Sensing Intrinsic Amplitude: 10.5 mV
Lead Channel Sensing Intrinsic Amplitude: 10.5 mV
Lead Channel Sensing Intrinsic Amplitude: 3.625 mV
Lead Channel Sensing Intrinsic Amplitude: 3.625 mV
Lead Channel Setting Pacing Amplitude: 2 V
Lead Channel Setting Pacing Amplitude: 2.5 V
Lead Channel Setting Pacing Pulse Width: 0.3 ms
Lead Channel Setting Sensing Sensitivity: 2 mV

## 2022-03-31 ENCOUNTER — Ambulatory Visit (INDEPENDENT_AMBULATORY_CARE_PROVIDER_SITE_OTHER): Payer: Medicare HMO

## 2022-03-31 DIAGNOSIS — E538 Deficiency of other specified B group vitamins: Secondary | ICD-10-CM

## 2022-03-31 MED ORDER — CYANOCOBALAMIN 1000 MCG/ML IJ SOLN
1000.0000 ug | INTRAMUSCULAR | Status: AC
  Administered 2022-03-31 – 2023-03-09 (×12): 1000 ug via INTRAMUSCULAR

## 2022-03-31 NOTE — Progress Notes (Signed)
Cyanocobalamin injection given to right deltoid.  Patient tolerated well. 

## 2022-04-14 NOTE — Progress Notes (Signed)
Remote pacemaker transmission.   

## 2022-04-17 ENCOUNTER — Ambulatory Visit (INDEPENDENT_AMBULATORY_CARE_PROVIDER_SITE_OTHER): Payer: Medicare HMO

## 2022-04-17 ENCOUNTER — Ambulatory Visit (INDEPENDENT_AMBULATORY_CARE_PROVIDER_SITE_OTHER): Payer: Medicare HMO | Admitting: Family

## 2022-04-17 ENCOUNTER — Other Ambulatory Visit: Payer: Self-pay

## 2022-04-17 ENCOUNTER — Encounter: Payer: Self-pay | Admitting: Family

## 2022-04-17 VITALS — BP 100/70 | HR 88 | Temp 97.2°F | Ht 64.0 in | Wt 145.0 lb

## 2022-04-17 DIAGNOSIS — I4891 Unspecified atrial fibrillation: Secondary | ICD-10-CM | POA: Diagnosis not present

## 2022-04-17 DIAGNOSIS — K219 Gastro-esophageal reflux disease without esophagitis: Secondary | ICD-10-CM

## 2022-04-17 DIAGNOSIS — D509 Iron deficiency anemia, unspecified: Secondary | ICD-10-CM

## 2022-04-17 DIAGNOSIS — E782 Mixed hyperlipidemia: Secondary | ICD-10-CM

## 2022-04-17 DIAGNOSIS — M542 Cervicalgia: Secondary | ICD-10-CM | POA: Diagnosis not present

## 2022-04-17 DIAGNOSIS — R6889 Other general symptoms and signs: Secondary | ICD-10-CM | POA: Diagnosis not present

## 2022-04-17 DIAGNOSIS — M810 Age-related osteoporosis without current pathological fracture: Secondary | ICD-10-CM | POA: Diagnosis not present

## 2022-04-17 DIAGNOSIS — M546 Pain in thoracic spine: Secondary | ICD-10-CM | POA: Diagnosis not present

## 2022-04-17 DIAGNOSIS — M545 Low back pain, unspecified: Secondary | ICD-10-CM | POA: Diagnosis not present

## 2022-04-17 DIAGNOSIS — J4520 Mild intermittent asthma, uncomplicated: Secondary | ICD-10-CM | POA: Diagnosis not present

## 2022-04-17 DIAGNOSIS — I5032 Chronic diastolic (congestive) heart failure: Secondary | ICD-10-CM | POA: Diagnosis not present

## 2022-04-17 DIAGNOSIS — G6289 Other specified polyneuropathies: Secondary | ICD-10-CM | POA: Diagnosis not present

## 2022-04-17 DIAGNOSIS — G8929 Other chronic pain: Secondary | ICD-10-CM | POA: Diagnosis not present

## 2022-04-17 DIAGNOSIS — F331 Major depressive disorder, recurrent, moderate: Secondary | ICD-10-CM | POA: Diagnosis not present

## 2022-04-17 DIAGNOSIS — Z950 Presence of cardiac pacemaker: Secondary | ICD-10-CM

## 2022-04-17 DIAGNOSIS — F411 Generalized anxiety disorder: Secondary | ICD-10-CM

## 2022-04-17 DIAGNOSIS — E538 Deficiency of other specified B group vitamins: Secondary | ICD-10-CM

## 2022-04-17 MED ORDER — ATORVASTATIN CALCIUM 20 MG PO TABS: 20.0000 mg | ORAL_TABLET | Freq: Every day | ORAL | 1 refills | Status: DC

## 2022-04-17 MED ORDER — BACLOFEN 10 MG PO TABS: 5.0000 mg | ORAL_TABLET | Freq: Three times a day (TID) | ORAL | 2 refills | Status: DC

## 2022-04-17 MED ORDER — DULOXETINE HCL 60 MG PO CPEP: 60.0000 mg | ORAL_CAPSULE | Freq: Every day | ORAL | 1 refills | Status: DC

## 2022-04-17 NOTE — Progress Notes (Signed)
Subjective:    Patient ID: Sabrina [name], female    DOB: 1946/04/08, 76 y.o.   MRN: [id number]  Chief Complaint  Patient presents with   Medical Management of Chronic Issues   Back Pain    mid   Pt presents to the office today for chronic follow up. Pt is followed by Cardiologists every 4 months for A Fib and CHF. Has a pacemaker.    She has osteoporosis and takes Fosamax weekly. Takes Vit D and Calcium daily. Last Dexa scan 12/25/21.  She has peripheral neuropathy and takes gabapentin 100 mg BID.  Back Pain This is a chronic problem. The current episode started more than 1 year ago. The problem occurs intermittently. The problem has been waxing and waning since onset. The pain is present in the lumbar spine. The quality of the pain is described as aching. The pain is at a severity of 4/10. The pain is moderate. The symptoms are aggravated by twisting. Associated symptoms include weakness. Pertinent negatives include no chest pain, paresthesias or pelvic pain. She has tried home exercises for the symptoms. The treatment provided mild relief.  Asthma She complains of cough and wheezing. This is a chronic problem. The current episode started more than 1 year ago. The problem occurs intermittently. Associated symptoms include heartburn and malaise/fatigue. Pertinent negatives include no chest pain. Her symptoms are alleviated by steroid inhaler and rest. She reports minimal improvement on treatment. Her past medical history is significant for asthma.  Gastroesophageal Reflux She complains of belching, coughing, heartburn and wheezing. She reports no chest pain. This is a chronic problem. The current episode started more than 1 year ago. The problem occurs occasionally. She has tried a PPI for the symptoms. The treatment provided moderate relief.  Anemia Presents for follow-up visit. Symptoms include malaise/fatigue. There has been no leg swelling or paresthesias.  Hyperlipidemia This is  a chronic problem. The current episode started more than 1 year ago. The problem is controlled. Recent lipid tests were reviewed and are normal. Pertinent negatives include no chest pain. Current antihyperlipidemic treatment includes statins. The current treatment provides moderate improvement of lipids. Risk factors for coronary artery disease include dyslipidemia, a sedentary lifestyle and post-menopausal.  Depression        This is a chronic problem.  The current episode started more than 1 year ago.   The onset quality is gradual.   The problem occurs intermittently.  Associated symptoms include sad.  Associated symptoms include no helplessness and no hopelessness.  Past treatments include SNRIs - Serotonin and norepinephrine reuptake inhibitors.     Review of Systems  Constitutional:  Positive for malaise/fatigue.  Respiratory:  Positive for cough and wheezing.   Cardiovascular:  Negative for chest pain.  Gastrointestinal:  Positive for heartburn.  Genitourinary:  Negative for pelvic pain.  Musculoskeletal:  Positive for back pain.  Neurological:  Positive for weakness. Negative for paresthesias.  Psychiatric/Behavioral:  Positive for depression.   All other systems reviewed and are negative.      Objective:   Physical Exam Vitals reviewed.  Constitutional:      General: She is not in acute distress.    Appearance: She is well-developed.  HENT:     Head: Normocephalic and atraumatic.     Right Ear: Tympanic membrane normal.     Left Ear: Tympanic membrane normal.  Eyes:     Pupils: Pupils are equal, round, and reactive to light.  Neck:     Thyroid: No  thyromegaly.  Cardiovascular:     Rate and Rhythm: Normal rate and regular rhythm.     Heart sounds: Normal heart sounds. No murmur heard. Pulmonary:     Effort: Pulmonary effort is normal. No respiratory distress.     Breath sounds: Normal breath sounds. No wheezing.  Abdominal:     General: Bowel sounds are normal. There  is no distension.     Palpations: Abdomen is soft.     Tenderness: There is no abdominal tenderness.  Musculoskeletal:        General: No tenderness. Normal range of motion.     Cervical back: Normal range of motion and neck supple.  Skin:    General: Skin is warm and dry.  Neurological:     Mental Status: She is alert and oriented to person, place, and time.     Cranial Nerves: No cranial nerve deficit.     Deep Tendon Reflexes: Reflexes are normal and symmetric.  Psychiatric:        Behavior: Behavior normal.        Thought Content: Thought content normal.        Judgment: Judgment normal.       BP 100/70   Pulse 88   Temp (!) 97.2 F (36.2 C)   Ht '5\' 4"'  (1.626 m)   Wt 145 lb (65.8 kg)   SpO2 98%   BMI 24.89 kg/m      Assessment & Plan:  KRYSTA BLOOMFIELD comes in today with chief complaint of Medical Management of Chronic Issues and Back Pain (mid)   Diagnosis and orders addressed:  1. GAD (generalized anxiety disorder) - DULoxetine (CYMBALTA) 60 MG capsule; Take 1 capsule (60 mg total) by mouth daily.  Dispense: 90 capsule; Refill: 1 - CMP14+EGFR  2. Chronic diastolic heart failure (HCC)  - CMP14+EGFR  3. Atrial fibrillation with RVR (HCC) - CMP14+EGFR  4. Mild intermittent asthma without complicatio - XYO11+WAQL  5. Gastroesophageal reflux disease, unspecified whether esophagitis present - CMP14+EGFR  6. Osteoporosis, unspecified osteoporosis type, unspecified pathological fracture presence  - CMP14+EGFR  7. Other polyneuropathy - CMP14+EGFR  8. Generalized anxiety disorder - CMP14+EGFR  9. Moderate episode of recurrent major depressive disorder (HCC)  - CMP14+EGFR  10. Iron deficiency anemia, unspecified iron deficiency anemia type - Anemia Profile B - CMP14+EGFR  11. Mixed hyperlipidemia  - atorvastatin (LIPITOR) 20 MG tablet; Take 1 tablet (20 mg total) by mouth at bedtime.  Dispense: 90 tablet; Refill: 1 - CMP14+EGFR  12.  Pacemaker - CMP14+EGFR  13. Vitamin B 12 deficiency - Anemia Profile B - CMP14+EGFR  14. Chronic bilateral low back pain without sciatica  - CMP14+EGFR - DG Lumbar Spine 2-3 Views - baclofen (LIORESAL) 10 MG tablet; Take 0.5 tablets (5 mg total) by mouth 3 (three) times daily.  Dispense: 30 each; Refill: 2 - Ambulatory referral to Orthopedic Surgery   Labs pending Health Maintenance reviewed Diet and exercise encouraged  Follow up plan: 3 months    Evelina Dun, FNP

## 2022-04-17 NOTE — Patient Instructions (Signed)

## 2022-04-18 LAB — ANEMIA PROFILE B
Basophils Absolute: 0.1 x10E3/uL (ref 0.0–0.2)
Basos: 1 %
EOS (ABSOLUTE): 0.2 x10E3/uL (ref 0.0–0.4)
Eos: 2 %
Ferritin: 12 ng/mL — ABNORMAL LOW (ref 15–150)
Folate: >20 ng/mL
Hematocrit: 35.1 % (ref 34.0–46.6)
Hemoglobin: 11.5 g/dL (ref 11.1–15.9)
Immature Grans (Abs): 0 x10E3/uL (ref 0.0–0.1)
Immature Granulocytes: 0 %
Iron Saturation: 14 % — ABNORMAL LOW (ref 15–55)
Iron: 54 ug/dL (ref 27–139)
Lymphocytes Absolute: 1.7 x10E3/uL (ref 0.7–3.1)
Lymphs: 21 %
MCH: 28 pg (ref 26.6–33.0)
MCHC: 32.8 g/dL (ref 31.5–35.7)
MCV: 85 fL (ref 79–97)
Monocytes Absolute: 0.5 x10E3/uL (ref 0.1–0.9)
Monocytes: 6 %
Neutrophils Absolute: 5.6 x10E3/uL (ref 1.4–7.0)
Neutrophils: 70 %
Platelets: 344 x10E3/uL (ref 150–450)
RBC: 4.11 x10E6/uL (ref 3.77–5.28)
RDW: 14 % (ref 11.7–15.4)
Retic Ct Pct: 1.3 % (ref 0.6–2.6)
Total Iron Binding Capacity: 376 ug/dL (ref 250–450)
UIBC: 322 ug/dL (ref 118–369)
Vitamin B-12: 669 pg/mL (ref 232–1245)
WBC: 8 x10E3/uL (ref 3.4–10.8)

## 2022-04-18 LAB — CMP14+EGFR
ALT: 14 IU/L (ref 0–32)
AST: 21 IU/L (ref 0–40)
Albumin/Globulin Ratio: 1.8 (ref 1.2–2.2)
Albumin: 3.9 g/dL (ref 3.8–4.8)
Alkaline Phosphatase: 110 IU/L (ref 44–121)
BUN/Creatinine Ratio: 23 (ref 12–28)
BUN: 20 mg/dL (ref 8–27)
Bilirubin Total: 0.8 mg/dL (ref 0.0–1.2)
CO2: 27 mmol/L (ref 20–29)
Calcium: 8.8 mg/dL (ref 8.7–10.3)
Chloride: 99 mmol/L (ref 96–106)
Creatinine, Ser: 0.87 mg/dL (ref 0.57–1.00)
Globulin, Total: 2.2 g/dL (ref 1.5–4.5)
Glucose: 100 mg/dL — ABNORMAL HIGH (ref 70–99)
Potassium: 4 mmol/L (ref 3.5–5.2)
Sodium: 141 mmol/L (ref 134–144)
Total Protein: 6.1 g/dL (ref 6.0–8.5)
eGFR: 70 mL/min/{1.73_m2} (ref >59)

## 2022-05-01 ENCOUNTER — Ambulatory Visit: Payer: Medicare HMO | Admitting: Cardiology

## 2022-05-01 ENCOUNTER — Ambulatory Visit: Payer: Medicare HMO

## 2022-05-01 ENCOUNTER — Ambulatory Visit (INDEPENDENT_AMBULATORY_CARE_PROVIDER_SITE_OTHER): Payer: Medicare HMO

## 2022-05-01 DIAGNOSIS — E538 Deficiency of other specified B group vitamins: Secondary | ICD-10-CM

## 2022-05-01 NOTE — Progress Notes (Signed)
Cyanocobalamin injection given to left deltoid.  Patient tolerated well. 

## 2022-05-05 ENCOUNTER — Ambulatory Visit (INDEPENDENT_AMBULATORY_CARE_PROVIDER_SITE_OTHER): Payer: Medicare HMO | Admitting: Cardiology

## 2022-05-05 ENCOUNTER — Other Ambulatory Visit: Payer: Self-pay | Admitting: Cardiology

## 2022-05-05 ENCOUNTER — Encounter: Payer: Self-pay | Admitting: Cardiology

## 2022-05-05 VITALS — BP 110/78 | HR 84 | Ht 64.0 in | Wt 144.8 lb

## 2022-05-05 DIAGNOSIS — I2510 Atherosclerotic heart disease of native coronary artery without angina pectoris: Secondary | ICD-10-CM | POA: Diagnosis not present

## 2022-05-05 DIAGNOSIS — I4891 Unspecified atrial fibrillation: Secondary | ICD-10-CM

## 2022-05-05 DIAGNOSIS — R600 Localized edema: Secondary | ICD-10-CM | POA: Diagnosis not present

## 2022-05-05 MED ORDER — FUROSEMIDE 40 MG PO TABS: 40.0000 mg | ORAL_TABLET | Freq: Two times a day (BID) | ORAL | Status: DC

## 2022-05-05 NOTE — Patient Instructions (Signed)
Medication Instructions:  Decrease your Lasix to '40mg'$  twice a day, may take an extra '20mg'$  as needed for leg swelling  Continue all other medications.     Labwork: none  Testing/Procedures: none  Follow-Up: 6 months   Any Other Special Instructions Will Be Listed Below (If Applicable).   If you need a refill on your cardiac medications before your next appointment, please call your pharmacy.

## 2022-05-05 NOTE — Progress Notes (Signed)
Clinical Summary Sabrina [name] is a 76 y.o.female  1.Persistent afib - s/p av nodal ablation and pacemaker - Gi bleeding issues on anticoag, now with watchman device.    - no palpitations - 03/2022 device check was normal     2. LE edema/chronic diastolic HF - some recent issues with leg edema.  - taking lasix 80m daily   05/2016 60-65%, no WMAs, mild MR, normal RV   - no recent edema - taks 643min AM ,4058mn PM.  - no SOB/DOE     3.Chest pain/CAD - 01/2022 cath without obstructive disease - no chest pains.     4. Dizziness - occurs with standing.    5. Hyperlipidemia - 01/2022 TC 119 TG 69 HDL 54 LDL 51  Doing silver sneakers at the gym Past Medical History:  Diagnosis Date   Anemia    Ankle fracture, right    past hx. -"no surgery"   Anxiety    Asthma    [place]   Chronic lower back pain    Collagen vascular disease (HCC)    COPD (chronic obstructive pulmonary disease) (HCC)    Depression    Fibromyalgia    GERD (gastroesophageal reflux disease)    Hyperlipidemia    Hypertension    Immature cataract of both eyes    [place] ([facility]  "I've had a light one; don't know when it happened" (08/27/2017)   Osteoarthritis    Peripheral neuropathy    legs and feet   Persistent atrial fibrillation (HCC)    Presence of Watchman left atrial appendage closure device 08/01/2021   24 mm Watchman FLXDevice LOT # [id number] Dr. LamQuentin OreTremors of nervous system    noted in hands by pt last 6 months   Tubular adenoma of colon      Allergies  Allergen Reactions   Codeine Nausea And Vomiting    Stomach pain also     Current Outpatient Medications  Medication Sig Dispense Refill   albuterol (VENTOLIN HFA) 108 (90 Base) MCG/ACT inhaler INHALE 2 PUFFS BY MOUTH EVERY 6 HOURS AS NEEDED FOR WHEEZE OR SHORTNESS OF BREATH 1 each 1   alendronate (FOSAMAX) 70 MG tablet Take 1 tablet (70 mg total) by mouth every 7  (seven) days. Take with a full glass of water on an empty stomach. 4 tablet 11   aspirin EC 81 MG EC tablet Take 1 tablet (81 mg total) by mouth daily. Swallow whole. 30 tablet 11   atorvastatin (LIPITOR) 20 MG tablet Take 1 tablet (20 mg total) by mouth at bedtime. 90 tablet 1   baclofen (LIORESAL) 10 MG tablet Take 0.5 tablets (5 mg total) by mouth 3 (three) times daily. 30 each 2   blood glucose meter kit and supplies Dispense based on patient and insurance preference. Use up to four times daily as directed. (FOR ICD-10 E10.9, E11.9). 1 each 0   Blood Glucose Monitoring Suppl (TRUE METRIX AIR GLUCOSE METER) w/Device KIT Test BS daily Dx R73.09 1 kit 0   cromolyn (NASALCROM) 5.2 MG/ACT nasal spray Place 1 spray into both nostrils 2 (two) times daily as needed for allergies.     cyanocobalamin (,VITAMIN B-12,) 1000 MCG/ML injection Inject 1,000 mcg into the muscle every 30 (thirty) days.     DULoxetine (CYMBALTA) 60 MG capsule Take 1 capsule (60 mg total) by mouth daily. 90 capsule 1   ferrous sulfate 325 (65 FE)  MG tablet Take 325 mg by mouth once a week.     fluticasone (FLONASE) 50 MCG/ACT nasal spray SPRAY 2 SPRAYS INTO EACH NOSTRIL EVERY DAY (Patient taking differently: 2 sprays daily as needed for allergies.) 48 g 1   furosemide (LASIX) 40 MG tablet Take 1-1.5 tablets (40-60 mg total) by mouth See admin instructions. Take 1.5 tablets (60 mg) by mouth once daily in the morning & may take an additional tablet (40 mg) in the evening if needed for fluid retention. 270 tablet 1   gabapentin (NEURONTIN) 100 MG capsule TAKE 1 CAPSULE TWICE DAILY (NEED MD APPOINTMENT) (Patient taking differently: 100 mg at bedtime.) 180 capsule 0   glucose blood (TRUE METRIX BLOOD GLUCOSE TEST) test strip Test BS daily Dx R73.09 100 each 3   Multiple Vitamin (MULTIVITAMIN WITH MINERALS) TABS tablet Take 1 tablet by mouth in the morning.     nitroGLYCERIN (NITROSTAT) 0.4 MG SL tablet Place 1 tablet (0.4 mg total) under  the tongue every 5 (five) minutes x 3 doses as needed for chest pain. 25 tablet 12   Omega-3 Fatty Acids (FISH OIL PO) Take 1 capsule by mouth daily.     pantoprazole (PROTONIX) 40 MG tablet TAKE 1 TABLET TWICE DAILY 180 tablet 0   potassium chloride SA (KLOR-CON M) 20 MEQ tablet Take 1 tablet (20 mEq total) by mouth 2 (two) times daily. Take 1 tablet twice a day 180 tablet 1   SYMBICORT 80-4.5 MCG/ACT inhaler Inhale 2 puffs into the lungs in the morning.     triamcinolone ointment (KENALOG) 0.5 % Apply 1 application topically 2 (two) times daily. (Patient taking differently: Apply 1 application  topically 2 (two) times daily as needed (rash).) 30 g 0   TRUEplus Lancets 33G MISC Test BS daily Dx R73.09 100 each 3   vitamin C (ASCORBIC ACID) 500 MG tablet Take 500 mg by mouth daily.     Vitamin D, Ergocalciferol, (DRISDOL) 1.25 MG (50000 UNIT) CAPS capsule TAKE 1 CAPSULE (50,000 UNITS TOTAL) BY MOUTH EVERY 7 (SEVEN) DAYS. 12 capsule 0   Current Facility-Administered Medications  Medication Dose Route Frequency Provider Last Rate Last Admin   cyanocobalamin ((VITAMIN B-12)) injection 1,000 mcg  1,000 mcg Intramuscular Q30 days Sharion Balloon, FNP   1,000 mcg at 05/01/22 1447     Past Surgical History:  Procedure Laterality Date   APPENDECTOMY     AV NODE ABLATION N/A 08/27/2017   Procedure: AV NODE ABLATION;  Surgeon: Evans Lance, MD;  Location: [facility];  Service: Cardiovascular;  Laterality: N/A;   CHOLECYSTECTOMY OPEN  1978   DILATION AND CURETTAGE OF UTERUS     FEMUR FRACTURE SURGERY Left 2013   "put 7" rod in it"   FRACTURE SURGERY     IVC FILTER REMOVAL N/A 06/20/2021   Procedure: IVC FILTER REMOVAL;  Surgeon: Marty Heck, MD;  Location: [facility];  Service: Cardiovascular;  Laterality: N/A;   LAPAROSCOPY  08/22/2016   Procedure: LAPAROSCOPY DIAGNOSTIC;  Surgeon: [name], MD;  Location: [facility];  Service: General;;   LEFT ATRIAL APPENDAGE  OCCLUSION N/A 08/01/2021   Procedure: LEFT ATRIAL APPENDAGE OCCLUSION;  Surgeon: Vickie Epley, MD;  Location: [facility];  Service: Cardiovascular;  Laterality: N/A;   LEFT HEART CATH AND CORONARY ANGIOGRAPHY N/A 02/06/2022   Procedure: LEFT HEART CATH AND CORONARY ANGIOGRAPHY;  Surgeon: Troy Sine, MD;  Location: [facility];  Service: Cardiovascular;  Laterality: N/A;   MEDIAL PARTIAL KNEE REPLACEMENT Right 2005   "@ [facility]"   PACEMAKER IMPLANT N/A 08/27/2017   Procedure: PACEMAKER IMPLANT;  Surgeon: Evans Lance, MD;  Location: [facility];  Service: Cardiovascular;  Laterality: N/A;   ROUX-EN-Y GASTRIC BYPASS  2002   [place]  2002   TEE WITHOUT CARDIOVERSION N/A 08/01/2021   Procedure: TRANSESOPHAGEAL ECHOCARDIOGRAM (TEE);  Surgeon: Vickie Epley, MD;  Location: [facility];  Service: Cardiovascular;  Laterality: N/A;   TONSILLECTOMY  1944   TUBAL LIGATION     VAGINAL HYSTERECTOMY       Allergies  Allergen Reactions   Codeine Nausea And Vomiting    Stomach pain also      Family History  Problem Relation Age of Onset   Cancer Mother    Alzheimer's disease Mother    Heart disease Mother    Arthritis Father    Asthma Daughter    Arthritis Daughter    Obesity Daughter    Colon cancer Paternal Uncle 1   Arthritis Son    Hyperlipidemia Son    Obesity Son    Arthritis Son    Obesity Son    BRCA 1/2 Neg Hx      Social History Ms. Grupe reports that she quit smoking about 29 years ago. Her smoking use included cigarettes. She has a 12.50 pack-year smoking history. She has never used smokeless tobacco. Ms. Taira reports that she does not currently use alcohol.   Review of Systems CONSTITUTIONAL: No weight loss, fever, chills, weakness or fatigue.  HEENT: Eyes: No visual loss, blurred vision, double vision or yellow sclerae.No hearing loss, sneezing, congestion, runny nose or sore  throat.  SKIN: No rash or itching.  CARDIOVASCULAR: per hpi RESPIRATORY: No shortness of breath, cough or sputum.  GASTROINTESTINAL: No anorexia, nausea, vomiting or diarrhea. No abdominal pain or blood.  GENITOURINARY: No burning on urination, no polyuria NEUROLOGICAL: No headache, dizziness, syncope, paralysis, ataxia, numbness or tingling in the extremities. No change in bowel or bladder control.  MUSCULOSKELETAL: No muscle, back pain, joint pain or stiffness.  LYMPHATICS: No enlarged nodes. No history of splenectomy.  PSYCHIATRIC: No history of depression or anxiety.  ENDOCRINOLOGIC: No reports of sweating, cold or heat intolerance. No polyuria or polydipsia.  Marland Kitchen   Physical Examination Today's Vitals   05/05/22 1510  BP: 110/78  Pulse: 84  SpO2: 99%  Weight: 144 lb 12.8 oz (65.7 kg)  Height: _0  (1.626 m)   Body mass index is 24.85 kg/m.  Gen: resting comfortably, no acute distress HEENT: no scleral icterus, pupils equal round and reactive, no palptable cervical adenopathy,  CV: RRR, no m/r/g no jvd Resp: Clear to auscultation bilaterally GI: abdomen is soft, non-tender, non-distended, normal bowel sounds, no hepatosplenomegaly MSK: extremities are warm, no edema.  Skin: warm, no rash Neuro:  no focal deficits Psych: appropriate affect   Diagnostic Studies  05/2016 echo Study Conclusions   - Left ventricle: The cavity size was normal. There was mild    concentric hypertrophy. Systolic function was normal. The    estimated ejection fraction was in the range of 60% to 65%. Wall    motion was normal; there were no regional wall motion    abnormalities. There was no evidence of elevated ventricular    filling pressure by Doppler parameters.  - Aortic valve: Trileaflet; normal thickness leaflets. There was no    regurgitation.  -  Mitral valve: Structurally normal valve. There was mild    regurgitation.  - Left atrium: The atrium was severely dilated.  - Right  ventricle: The cavity size was normal. Wall thickness was    normal. Systolic function was normal.  - Right atrium: The atrium was normal in size.  - Tricuspid valve: There was mild regurgitation.  - Pulmonic valve: There was no regurgitation.  - Pulmonary arteries: Systolic pressure was within the normal    range.  - Inferior vena cava: The vessel was normal in size.  - Pericardium, extracardiac: There was no pericardial effusion.        05/2016 nuclear stress There was no ST segment deviation noted during stress. The study is normal. This is a low risk study. The left ventricular ejection fraction is hyperdynamic (>65%).   Normal pharmacologic stress test with no evidence of scar or ischemia.    01/2022 cath   Prox LAD lesion is 20% stenosed.   Mid LAD-1 lesion is 20% stenosed.   Mid LAD-2 lesion is 20% stenosed.   1st Mrg lesion is 20% stenosed.   Prox RCA lesion is 20% stenosed.   The left ventricular systolic function is normal.   LV end diastolic pressure is normal.   The left ventricular ejection fraction is 55-65% by visual estimate.   Three-vessel coronary calcification with mild nonobstructive CAD with a dominant RCA.   Normal LV function with EF estimated at 55 to 65%; LVEDP 12 mmHg.   RECOMMENDATION: Medical therapy for multivessel nonobstructive CAD.  Aspirin for antiplatelet benefit.  Aggressive lipid-lowering therapy with target LDL less than 70.  01/2022 echo   1. Left ventricular ejection fraction, by estimation, is 60 to 65%. The  left ventricle has normal function. The left ventricle has no regional  wall motion abnormalities. Left ventricular diastolic parameters are  indeterminate.   2. Pacing wires in RA/RV . Right ventricular systolic function is normal.  The right ventricular size is normal. There is normal pulmonary artery  systolic pressure.   3. Left atrial size was moderately dilated.   4. Right atrial size was mildly dilated.   5. The mitral  valve is abnormal. Mild mitral valve regurgitation. No  evidence of mitral stenosis.   6. The aortic valve is tricuspid. There is mild calcification of the  aortic valve. Aortic valve regurgitation is mild. Aortic valve sclerosis  is present, with no evidence of aortic valve stenosis.   7. The inferior vena cava is normal in size with greater than 50%  respiratory variability, suggesting right atrial pressure of 3 mmHg.     Assessment and Plan  1.Afib/pacemaker - she is s/p av nodal ablation with pacemaker  - has watchman device, prior GI bleeding issues on anticoagulation - recent normal device check, continue to monitor   2. LE edema - some recent orthostatic dizziness - will lower lasix to 54m bid, may take additional 224mprn  3. CAD - mild disease by cath, continue medical therapy.          JoArnoldo LenisM.D.

## 2022-05-14 ENCOUNTER — Ambulatory Visit (INDEPENDENT_AMBULATORY_CARE_PROVIDER_SITE_OTHER): Payer: Medicare HMO | Admitting: Orthopedic Surgery

## 2022-05-14 ENCOUNTER — Encounter: Payer: Self-pay | Admitting: Orthopedic Surgery

## 2022-05-14 VITALS — Ht 64.0 in | Wt 144.0 lb

## 2022-05-14 DIAGNOSIS — M546 Pain in thoracic spine: Secondary | ICD-10-CM | POA: Diagnosis not present

## 2022-05-14 DIAGNOSIS — G8929 Other chronic pain: Secondary | ICD-10-CM | POA: Diagnosis not present

## 2022-05-15 ENCOUNTER — Encounter: Payer: Self-pay | Admitting: Orthopedic Surgery

## 2022-05-15 NOTE — Progress Notes (Signed)
New Patient Visit  Assessment: Sabrina [name] is a 76 y.o. female with the following: 1. Chronic midline thoracic back pain  Plan: Sabrina [name] has pain in the upper back area.  This is progressively worsening.  It has been ongoing for couple of years.  No specific injury, although she thinks that she may have exacerbated her pain with an increase in her activity.  She is interested in physical therapy.  She is also complaining of her balance being affected.  I think the therapy will help her in both regards.  Follow-up as needed.  Follow-up: Return if symptoms worsen or fail to improve.  Subjective:  Chief Complaint  Patient presents with   Back Pain    Mid back pain for a couple years but worse in past couple days. States she increased her distance on the recumbent bike that she feels may have caused it.     History of Present Illness: Sabrina [name] is a 76 y.o. female who has been referred by Evelina Dun, FNP for evaluation of upper back pain.  She has had pain in the upper back for a couple of years.  It is recently gotten worse.  She has tried to remain active, and is using a recumbent bike.  However, she is concerned that this has exacerbated her pain.  She takes medications on an intermittent basis.  No numbness or tingling.  She does not have any pain radiating into her legs.  She has not worked with physical therapy.  No prior surgeries on her back.   Review of Systems: No fevers or chills No numbness or tingling No chest pain No shortness of breath No bowel or bladder dysfunction No GI distress No headaches   Medical History:  Past Medical History:  Diagnosis Date   Anemia    Ankle fracture, right    past hx. -"no surgery"   Anxiety    Asthma    CHF (congestive heart failure) ([place]) 2009   Chronic lower back pain    Collagen vascular disease (HCC)    COPD (chronic obstructive pulmonary disease) (HCC)    Depression    Fibromyalgia    GERD  (gastroesophageal reflux disease)    Hyperlipidemia    Hypertension    Immature cataract of both eyes    Myocardial infarction ([place])    "I've had a light one; don't know when it happened" (08/27/2017)   Osteoarthritis    Peripheral neuropathy    legs and feet   Persistent atrial fibrillation ([place])    Presence of Watchman left atrial appendage closure device 08/01/2021   24 mm Watchman FLXDevice LOT # [id number] by Dr. Quentin Ore   Tremors of nervous system    noted in hands by pt last 6 months   Tubular adenoma of colon     Past Surgical History:  Procedure Laterality Date   APPENDECTOMY     AV NODE ABLATION N/A 08/27/2017   Procedure: AV NODE ABLATION;  Surgeon: Evans Lance, MD;  Location: [facility];  Service: Cardiovascular;  Laterality: N/A;   CHOLECYSTECTOMY OPEN  1978   DILATION AND CURETTAGE OF UTERUS     FEMUR FRACTURE SURGERY Left 2013   "put 7" rod in it"   FRACTURE SURGERY     IVC FILTER REMOVAL N/A 06/20/2021   Procedure: IVC FILTER REMOVAL;  Surgeon: Marty Heck, MD;  Location: [facility];  Service: Cardiovascular;  Laterality: N/A;   LAPAROSCOPY  08/22/2016  Procedure: LAPAROSCOPY DIAGNOSTIC;  Surgeon: [name], MD;  Location[facility];  Service: General;;   LEFT ATRIAL APPENDAGE OCCLUSION N/A 08/01/2021   Procedure: LEFT ATRIAL APPENDAGE OCCLUSION;  Surgeon: Vickie Epley, MD;  Location: [facility];  Service: Cardiovascular;  Laterality: N/A;   LEFT HEART CATH AND CORONARY ANGIOGRAPHY N/A 02/06/2022   Procedure: LEFT HEART CATH AND CORONARY ANGIOGRAPHY;  Surgeon: Troy Sine, MD;  Location: [facility];  Service: Cardiovascular;  Laterality: N/A;   MEDIAL PARTIAL KNEE REPLACEMENT Right 2005   "@ [facility]"   PACEMAKER IMPLANT N/A 08/27/2017   Procedure: PACEMAKER IMPLANT;  Surgeon: Evans Lance, MD;  Location: [facility];  Service: Cardiovascular;  Laterality: N/A;   ROUX-EN-Y GASTRIC BYPASS  2002   [place]  2002   TEE WITHOUT CARDIOVERSION N/A 08/01/2021   Procedure: TRANSESOPHAGEAL ECHOCARDIOGRAM (TEE);  Surgeon: Vickie Epley, MD;  Location: [facility];  Service: Cardiovascular;  Laterality: N/A;   TONSILLECTOMY  1944   TUBAL LIGATION     VAGINAL HYSTERECTOMY      Family History  Problem Relation Age of Onset   Cancer Mother    Alzheimer's disease Mother    Heart disease Mother    Arthritis Father    Asthma Daughter    Arthritis Daughter    Obesity Daughter    Colon cancer Paternal Uncle 50   Arthritis Son    Hyperlipidemia Son    Obesity Son    Arthritis Son    Obesity Son    BRCA 1/2 Neg Hx    Social History   Tobacco Use   Smoking status: Former    Packs/day: 0.50    Years: 25.00    Total pack years: 12.50    Types: Cigarettes    Quit date: 02/26/1993    Years since quitting: 29.2   Smokeless tobacco: Never  Vaping Use   Vaping Use: Never used  Substance Use Topics   Alcohol use: Not Currently    Comment: 08/27/2017 "nothing since early 2000s"   Drug use: Not Currently    Allergies  Allergen Reactions   Codeine Nausea And Vomiting    Stomach pain also    Current Meds  Medication Sig   albuterol (VENTOLIN HFA) 108 (90 Base) MCG/ACT inhaler INHALE 2 PUFFS BY MOUTH EVERY 6 HOURS AS NEEDED FOR WHEEZE OR SHORTNESS OF BREATH   alendronate (FOSAMAX) 70 MG tablet Take 1 tablet (70 mg total) by mouth every 7 (seven) days. Take with a full glass of water on an empty stomach.   aspirin EC 81 MG EC tablet Take 1 tablet (81 mg total) by mouth daily. Swallow whole.   atorvastatin (LIPITOR) 20 MG tablet Take 40 mg by mouth daily.   baclofen (LIORESAL) 10 MG tablet Take 0.5 tablets (5 mg total) by mouth 3 (three) times daily.   blood glucose meter kit and supplies Dispense based on patient and insurance preference. Use up to four times daily as directed. (FOR ICD-10 E10.9, E11.9).   Blood Glucose Monitoring Suppl (TRUE METRIX AIR  GLUCOSE METER) w/Device KIT Test BS daily Dx R73.09   cromolyn (NASALCROM) 5.2 MG/ACT nasal spray Place 1 spray into both nostrils 2 (two) times daily as needed for allergies.   cyanocobalamin (,VITAMIN B-12,) 1000 MCG/ML injection Inject 1,000 mcg into the muscle every 30 (thirty) days.   DULoxetine (CYMBALTA) 60 MG capsule Take 1 capsule (60 mg total)  by mouth daily.   ferrous sulfate 325 (65 FE) MG tablet Take 325 mg by mouth 3 (three) times a week.   fluticasone (FLONASE) 50 MCG/ACT nasal spray SPRAY 2 SPRAYS INTO EACH NOSTRIL EVERY DAY (Patient taking differently: 2 sprays daily as needed for allergies.)   furosemide (LASIX) 40 MG tablet Take 1 tablet (40 mg total) by mouth 2 (two) times daily. (May take an extra 38m as needed for leg swelling.)   gabapentin (NEURONTIN) 100 MG capsule TAKE 1 CAPSULE TWICE DAILY (NEED MD APPOINTMENT) (Patient taking differently: 100 mg at bedtime.)   glucose blood (TRUE METRIX BLOOD GLUCOSE TEST) test strip Test BS daily Dx R73.09   Multiple Vitamin (MULTIVITAMIN WITH MINERALS) TABS tablet Take 1 tablet by mouth in the morning.   nitroGLYCERIN (NITROSTAT) 0.4 MG SL tablet Place 1 tablet (0.4 mg total) under the tongue every 5 (five) minutes x 3 doses as needed for chest pain.   Omega-3 Fatty Acids (FISH OIL PO) Take 1 capsule by mouth daily.   pantoprazole (PROTONIX) 40 MG tablet TAKE 1 TABLET TWICE DAILY   potassium chloride SA (KLOR-CON M) 20 MEQ tablet Take 1 tablet (20 mEq total) by mouth 2 (two) times daily. Take 1 tablet twice a day   SYMBICORT 80-4.5 MCG/ACT inhaler Inhale 2 puffs into the lungs in the morning.   triamcinolone ointment (KENALOG) 0.5 % Apply 1 application topically 2 (two) times daily. (Patient taking differently: Apply 1 application  topically 2 (two) times daily as needed (rash).)   TRUEplus Lancets 33G MISC Test BS daily Dx R73.09   vitamin C (ASCORBIC ACID) 500 MG tablet Take 500 mg by mouth daily.   Vitamin D, Ergocalciferol,  (DRISDOL) 1.25 MG (50000 UNIT) CAPS capsule TAKE 1 CAPSULE (50,000 UNITS TOTAL) BY MOUTH EVERY 7 (SEVEN) DAYS.   Current Facility-Administered Medications for the 05/14/22 encounter (Office Visit) with CMordecai Rasmussen MD  Medication   cyanocobalamin ((VITAMIN B-12)) injection 1,000 mcg    Objective: Ht '5\' 4"'  (1.626 m)   Wt 144 lb (65.3 kg)   BMI 24.72 kg/m   Physical Exam:  General: Elderly female., Alert and oriented., and No acute distress. Gait: Slow, steady gait.  Point tenderness in the mid upper back.  Pain within the paraspinal muscles.  Negative straight leg raise bilaterally.  Good upper body strength.  Sensation is intact in all dermatomal distributions.  IMAGING: I personally reviewed images previously obtained in clinic  Cervical spine x-ray  No recent fracture is seen. Cervical spondylosis with encroachment of neural foramina from C3-C7 levels, more so at C4-C5 and C5-C6 levels.  Thoracic spine x-ray  No acute bony abnormality   New Medications:  No orders of the defined types were placed in this encounter.     MMordecai Rasmussen MD  05/15/2022 10:23 AM

## 2022-05-20 ENCOUNTER — Other Ambulatory Visit: Payer: Self-pay | Admitting: Cardiology

## 2022-05-25 ENCOUNTER — Other Ambulatory Visit: Payer: Self-pay

## 2022-05-25 ENCOUNTER — Encounter (HOSPITAL_COMMUNITY): Payer: Self-pay | Admitting: *Deleted

## 2022-05-25 ENCOUNTER — Emergency Department (HOSPITAL_COMMUNITY): Payer: Medicare HMO

## 2022-05-25 ENCOUNTER — Emergency Department (HOSPITAL_COMMUNITY)
Admission: EM | Admit: 2022-05-25 | Discharge: 2022-05-25 | Disposition: A | Discharge status: Home / Self Care | Payer: Medicare HMO | Attending: Emergency Medicine | Admitting: Emergency Medicine

## 2022-05-25 DIAGNOSIS — I110 Hypertensive heart disease with heart failure: Secondary | ICD-10-CM | POA: Diagnosis not present

## 2022-05-25 DIAGNOSIS — I509 Heart failure, unspecified: Secondary | ICD-10-CM | POA: Diagnosis not present

## 2022-05-25 DIAGNOSIS — Z79899 Other long term (current) drug therapy: Secondary | ICD-10-CM | POA: Diagnosis not present

## 2022-05-25 DIAGNOSIS — J45909 Unspecified asthma, uncomplicated: Secondary | ICD-10-CM | POA: Insufficient documentation

## 2022-05-25 DIAGNOSIS — W19XXXA Unspecified fall, initial encounter: Secondary | ICD-10-CM

## 2022-05-25 DIAGNOSIS — S42202A Unspecified fracture of upper end of left humerus, initial encounter for closed fracture: Secondary | ICD-10-CM | POA: Insufficient documentation

## 2022-05-25 DIAGNOSIS — M25532 Pain in left wrist: Secondary | ICD-10-CM | POA: Diagnosis not present

## 2022-05-25 DIAGNOSIS — M25512 Pain in left shoulder: Secondary | ICD-10-CM | POA: Diagnosis not present

## 2022-05-25 DIAGNOSIS — Z7982 Long term (current) use of aspirin: Secondary | ICD-10-CM | POA: Diagnosis not present

## 2022-05-25 DIAGNOSIS — S62002A Unspecified fracture of navicular [scaphoid] bone of left wrist, initial encounter for closed fracture: Secondary | ICD-10-CM

## 2022-05-25 DIAGNOSIS — S62015A Nondisplaced fracture of distal pole of navicular [scaphoid] bone of left wrist, initial encounter for closed fracture: Secondary | ICD-10-CM | POA: Insufficient documentation

## 2022-05-25 DIAGNOSIS — S4992XA Unspecified injury of left shoulder and upper arm, initial encounter: Secondary | ICD-10-CM | POA: Diagnosis present

## 2022-05-25 DIAGNOSIS — S42292A Other displaced fracture of upper end of left humerus, initial encounter for closed fracture: Secondary | ICD-10-CM | POA: Diagnosis not present

## 2022-05-25 DIAGNOSIS — J449 Chronic obstructive pulmonary disease, unspecified: Secondary | ICD-10-CM | POA: Diagnosis not present

## 2022-05-25 MED ORDER — ACETAMINOPHEN 500 MG PO TABS
500.0000 mg | ORAL_TABLET | Freq: Once | ORAL | Status: AC
  Administered 2022-05-25: 500 mg via ORAL
  Filled 2022-05-25: qty 1

## 2022-05-25 NOTE — ED Notes (Signed)
Splint and sling applied. Went over [employer]. All questions answered. Ambulatory to lobby.

## 2022-05-25 NOTE — ED Notes (Signed)
Cleansed and put the skin back together on pt's L forearm--telfa bandage and ace wrap on L forearm as well. Pt transported to CT for further testing on wrist
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
## 2022-05-25 NOTE — ED Notes (Signed)
ED Provider at bedside. 

## 2022-05-31 ENCOUNTER — Emergency Department (HOSPITAL_COMMUNITY)
Admission: EM | Admit: 2022-05-31 | Discharge: 2022-05-31 | Disposition: A | Discharge status: Home / Self Care | Payer: Medicare HMO | Attending: Emergency Medicine | Admitting: Emergency Medicine

## 2022-05-31 ENCOUNTER — Encounter (HOSPITAL_COMMUNITY): Payer: Self-pay

## 2022-05-31 ENCOUNTER — Other Ambulatory Visit: Payer: Self-pay

## 2022-05-31 DIAGNOSIS — Z4802 Encounter for removal of sutures: Secondary | ICD-10-CM | POA: Diagnosis not present

## 2022-05-31 DIAGNOSIS — Z7982 Long term (current) use of aspirin: Secondary | ICD-10-CM | POA: Insufficient documentation

## 2022-05-31 DIAGNOSIS — Z4801 Encounter for change or removal of surgical wound dressing: Secondary | ICD-10-CM | POA: Diagnosis not present

## 2022-05-31 DIAGNOSIS — Z5189 Encounter for other specified aftercare: Secondary | ICD-10-CM

## 2022-05-31 DIAGNOSIS — S51812D Laceration without foreign body of left forearm, subsequent encounter: Secondary | ICD-10-CM | POA: Diagnosis not present

## 2022-05-31 MED ORDER — CEPHALEXIN 500 MG PO CAPS
500.0000 mg | ORAL_CAPSULE | Freq: Once | ORAL | Status: AC
Start: 2022-05-31 — End: 2022-05-31
  Administered 2022-05-31: 500 mg via ORAL
  Filled 2022-05-31: qty 1

## 2022-05-31 MED ORDER — CEPHALEXIN 500 MG PO CAPS
500.0000 mg | ORAL_CAPSULE | Freq: Four times a day (QID) | ORAL | 0 refills | Status: DC
Start: 2022-05-31 — End: 2022-06-24
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
## 2022-05-31 NOTE — ED Provider Notes (Signed)
[facility] EMERGENCY DEPARTMENT Provider Note   CSN: [id number] Arrival date & time: 05/31/22  1505     History {Add pertinent medical, surgical, social history, OB history to HPI:1} Chief Complaint  Patient presents with   Wound Check    Sabrina [name] is a 76 y.o. female   The history is provided by the patient.       Home Medications Prior to Admission medications   Medication Sig Start Date End Date Taking? Authorizing Provider  albuterol (VENTOLIN HFA) 108 (90 Base) MCG/ACT inhaler INHALE 2 PUFFS BY MOUTH EVERY 6 HOURS AS NEEDED FOR WHEEZE OR SHORTNESS OF BREATH 08/23/21   Evelina Dun A, FNP  alendronate (FOSAMAX) 70 MG tablet Take 1 tablet (70 mg total) by mouth every 7 (seven) days. Take with a full glass of water on an empty stomach. 02/03/22 02/03/23  Evelina Dun A, FNP  aspirin EC 81 MG EC tablet Take 1 tablet (81 mg total) by mouth daily. Swallow whole. 08/02/21   Kathyrn Drown D, NP  atorvastatin (LIPITOR) 20 MG tablet Take 40 mg by mouth daily.    [provider]  baclofen (LIORESAL) 10 MG tablet Take 0.5 tablets (5 mg total) by mouth 3 (three) times daily. 04/17/22   Sharion Balloon, FNP  blood glucose meter kit and supplies Dispense based on patient and insurance preference. Use up to four times daily as directed. (FOR ICD-10 E10.9, E11.9). 02/11/22   Ivy Lynn, NP  Blood Glucose Monitoring Suppl (TRUE METRIX AIR GLUCOSE METER) w/Device KIT Test BS daily Dx R73.09 02/12/22   Ivy Lynn, NP  cromolyn (NASALCROM) 5.2 MG/ACT nasal spray Place 1 spray into both nostrils 2 (two) times daily as needed for allergies.    [provider]  cyanocobalamin (,VITAMIN B-12,) 1000 MCG/ML injection Inject 1,000 mcg into the muscle every 30 (thirty) days.    [provider]  DULoxetine (CYMBALTA) 60 MG capsule Take 1 capsule (60 mg total) by mouth daily. 04/17/22   Evelina Dun A, FNP  ferrous sulfate 325 (65 FE) MG tablet Take 325 mg by  mouth 3 (three) times a week.    [provider]  fluticasone (FLONASE) 50 MCG/ACT nasal spray SPRAY 2 SPRAYS INTO EACH NOSTRIL EVERY DAY Patient taking differently: 2 sprays daily as needed for allergies. 09/02/21   Sharion Balloon, FNP  furosemide (LASIX) 40 MG tablet Take 1 tablet (40 mg total) by mouth 2 (two) times daily. (May take an extra 13m as needed for leg swelling.) 05/05/22   BArnoldo Lenis MD  gabapentin (NEURONTIN) 100 MG capsule TAKE 1 CAPSULE TWICE DAILY (NEED MD APPOINTMENT) Patient taking differently: 100 mg at bedtime. 08/23/21   HEvelina DunA, FNP  glucose blood (TRUE METRIX BLOOD GLUCOSE TEST) test strip Test BS daily Dx R73.09 02/12/22   IIvy Lynn NP  Multiple Vitamin (MULTIVITAMIN WITH MINERALS) TABS tablet Take 1 tablet by mouth in the morning.    [provider]  nitroGLYCERIN (NITROSTAT) 0.4 MG SL tablet Place 1 tablet (0.4 mg total) under the tongue every 5 (five) minutes x 3 doses as needed for chest pain. 02/07/22   Bhagat, BCrista Luria PA  Omega-3 Fatty Acids (FISH OIL PO) Take 1 capsule by mouth daily.    [provider]  pantoprazole (PROTONIX) 40 MG tablet TAKE 1 TABLET TWICE DAILY 11/25/21   HEvelina DunA, FNP  potassium chloride SA (KLOR-CON M) 20 MEQ tablet Take 1 tablet (20 mEq total)  by mouth 2 (two) times daily. Take 1 tablet twice a day 02/26/22   Leanor Kail, PA  SYMBICORT 80-4.5 MCG/ACT inhaler Inhale 2 puffs into the lungs in the morning. 12/31/20   [provider]  triamcinolone ointment (KENALOG) 0.5 % Apply 1 application topically 2 (two) times daily. Patient taking differently: Apply 1 application  topically 2 (two) times daily as needed (rash). 10/12/17   Sharion Balloon, FNP  TRUEplus Lancets 33G MISC Test BS daily Dx R73.09 02/12/22   Ivy Lynn, NP  vitamin C (ASCORBIC ACID) 500 MG tablet Take 500 mg by mouth daily.    [provider]  Vitamin D, Ergocalciferol, (DRISDOL) 1.25 MG  (50000 UNIT) CAPS capsule TAKE 1 CAPSULE (50,000 UNITS TOTAL) BY MOUTH EVERY 7 (SEVEN) DAYS. 08/21/21   Sharion Balloon, FNP      Allergies    Codeine    Review of Systems   Review of Systems  Physical Exam Updated Vital Signs BP 136/71 (BP Location: Right Arm)   Pulse 72   Temp 98.3 F (36.8 C) (Oral)   Resp 18   Ht '5\' 4"'  (1.626 m)   Wt 65.3 kg   SpO2 100%   BMI 24.72 kg/m  Physical Exam  ED Results / Procedures / Treatments   Labs (all labs ordered are listed, but only abnormal results are displayed) Labs Reviewed - No data to display  EKG None  Radiology No results found.  Procedures Procedures  {Document cardiac monitor, telemetry assessment procedure when appropriate:1}  Medications Ordered in ED Medications - No data to display  ED Course/ Medical Decision Making/ A&P                           Medical Decision Making  ***  {Document critical care time when appropriate:1} {Document review of labs and clinical decision tools ie heart score, Chads2Vasc2 etc:1}  {Document your independent review of radiology images, and any outside records:1} {Document your discussion with family members, caretakers, and with consultants:1} {Document social determinants of health affecting pt's care:1} {Document your decision making why or why not admission, treatments were needed:1} Final Clinical Impression(s) / ED Diagnoses Final diagnoses:  None    Rx / DC Orders ED Discharge Orders     None

## 2022-05-31 NOTE — Discharge Instructions (Signed)
Keep your wounds clean and dry.  Take the entire course of the antibiotics prescribed.

## 2022-06-02 ENCOUNTER — Ambulatory Visit (INDEPENDENT_AMBULATORY_CARE_PROVIDER_SITE_OTHER): Payer: Medicare HMO | Admitting: *Deleted

## 2022-06-02 DIAGNOSIS — E538 Deficiency of other specified B group vitamins: Secondary | ICD-10-CM

## 2022-06-02 NOTE — Progress Notes (Signed)
Pt in today for B12 injection. Given in right deltoid. Pt tolerated well.

## 2022-06-03 ENCOUNTER — Encounter: Payer: Self-pay | Admitting: Orthopedic Surgery

## 2022-06-03 ENCOUNTER — Ambulatory Visit (INDEPENDENT_AMBULATORY_CARE_PROVIDER_SITE_OTHER): Payer: Medicare HMO

## 2022-06-03 ENCOUNTER — Ambulatory Visit (INDEPENDENT_AMBULATORY_CARE_PROVIDER_SITE_OTHER): Payer: Medicare HMO | Admitting: Orthopedic Surgery

## 2022-06-03 VITALS — Ht 64.0 in | Wt 144.0 lb

## 2022-06-03 DIAGNOSIS — S42202A Unspecified fracture of upper end of left humerus, initial encounter for closed fracture: Secondary | ICD-10-CM

## 2022-06-03 DIAGNOSIS — M25512 Pain in left shoulder: Secondary | ICD-10-CM
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
## 2022-06-04 ENCOUNTER — Telehealth: Payer: Self-pay | Admitting: Orthopedic Surgery

## 2022-06-04 NOTE — Telephone Encounter (Signed)
Patient has just called to relay that her left hand is very swollen since her appointment yesterday, 06/03/22, for left shoulder fracture. Sabrina [name] has been elevating. Please advise if needs to be seen - if so, said she can get to [facility] in 15 minutes.

## 2022-06-05 NOTE — Telephone Encounter (Signed)
Called pt who states hand is much better today. Let her know information from provider, she verbalized understanding.

## 2022-06-10 ENCOUNTER — Ambulatory Visit (INDEPENDENT_AMBULATORY_CARE_PROVIDER_SITE_OTHER): Payer: Medicare HMO | Admitting: Orthopedic Surgery

## 2022-06-10 ENCOUNTER — Encounter: Payer: Self-pay | Admitting: Orthopedic Surgery

## 2022-06-10 ENCOUNTER — Encounter: Payer: Self-pay | Admitting: Family

## 2022-06-10 ENCOUNTER — Ambulatory Visit (INDEPENDENT_AMBULATORY_CARE_PROVIDER_SITE_OTHER): Payer: Medicare HMO | Admitting: Family

## 2022-06-10 ENCOUNTER — Encounter: Payer: Self-pay | Admitting: Radiology

## 2022-06-10 ENCOUNTER — Ambulatory Visit (INDEPENDENT_AMBULATORY_CARE_PROVIDER_SITE_OTHER): Payer: Medicare HMO

## 2022-06-10 VITALS — BP 124/80 | HR 73 | Temp 97.0°F | Ht 64.0 in | Wt 141.4 lb

## 2022-06-10 DIAGNOSIS — S42202A Unspecified fracture of upper end of left humerus, initial encounter for closed fracture: Secondary | ICD-10-CM | POA: Diagnosis not present

## 2022-06-10 DIAGNOSIS — S42202D Unspecified fracture of upper end of left humerus, subsequent encounter for fracture with routine healing: Secondary | ICD-10-CM

## 2022-06-10 DIAGNOSIS — Z09 Encounter for follow-up examination after completed treatment for conditions other than malignant neoplasm: Secondary | ICD-10-CM | POA: Diagnosis not present

## 2022-06-10 DIAGNOSIS — Z23 Encounter for immunization: Secondary | ICD-10-CM | POA: Diagnosis not present

## 2022-06-10 DIAGNOSIS — I10 Essential (primary) hypertension: Secondary | ICD-10-CM | POA: Diagnosis not present

## 2022-06-10 DIAGNOSIS — S42202S Unspecified fracture of upper end of left humerus, sequela: Secondary | ICD-10-CM

## 2022-06-10 NOTE — Progress Notes (Signed)
Orthopaedic Clinic Return  Assessment: Sabrina [name] is a 76 y.o. female with the following: Left proximal humerus fracture   Plan: Sabrina [name] sustained a left proximal humerus fracture, 2 weeks ago.  Radiographs slight displacement compared to prior x-rays.  However, overall alignment remains stable.  Swelling and bruising in the left arm has improved.  She should continue using the sling.  I will see her back in 2 weeks.    Follow-up: Return in about 2 weeks (around 06/24/2022).   Subjective:  Chief Complaint  Patient presents with   Shoulder Injury    Left/ improving some     History of Present Illness: Sabrina [name] is a 76 y.o. female who returns to clinic for evaluation of left shoulder pain.  Proximally 2 weeks ago, she fell and sustained a left proximal humerus fracture.  She has remained in a sling.  Her swelling has improved.  No current drainage or issues with her lacerations.  Pain is getting better.  No numbness or tingling.   Review of Systems: No fevers or chills No numbness or tingling No chest pain No shortness of breath No bowel or bladder dysfunction No GI distress No headaches   Objective: There were no vitals taken for this visit.  Physical Exam:  Elderly female.  Alert and oriented.  No acute distress.  Left arm with mild bruising.  Minimal swelling to the left hand.  No drainage over the lacerations to the left forearm.  Sensation is intact in the axillary nerve distribution.  She is able to wiggle her fingers.  2+ radial pulse.  IMAGING: I personally ordered and reviewed the following images:  X-rays of the left shoulder were obtained in clinic today.  No acute injuries are noted.  There has been mild interval displacement at the fracture site with slight medial translation of the shaft.  Glenohumeral joint remains reduced.  Impression: Left proximal humerus fracture in acceptable alignment    Sabrina Rasmussen,  MD 06/10/2022 2:15 PM

## 2022-06-10 NOTE — Progress Notes (Signed)
Subjective:    Patient ID: Sabrina [name], female    DOB: 09/05/1946, 76 y.o.   MRN: [id number]  Chief Complaint  Patient presents with   Hospitalization Follow-up   PT presents to the office today for hospital follow up from left closed fracture of humerus. States she was on her front porch and got her arm hung in a bike. She is followed by Ortho.  She is currently in a sling. She had several skin tears and has steri-strips in place. Denies any fever, discharge.   Reports her pain is 0 right now because she took a "pain pill" before she came. She is taking baclofen.  Arm Injury  The incident occurred more than 1 week ago. The injury mechanism was a fall. The pain is present in the left wrist. The quality of the pain is described as aching. The pain is at a severity of 0/10.  Hypertension This is a chronic problem. The current episode started more than 1 year ago. The problem has been resolved since onset. The problem is controlled. Associated symptoms include malaise/fatigue. Pertinent negatives include no peripheral edema or shortness of breath. Risk factors for coronary artery disease include dyslipidemia. The current treatment provides moderate improvement.      Review of Systems  Constitutional:  Positive for malaise/fatigue.  Respiratory:  Negative for shortness of breath.   All other systems reviewed and are negative.      Objective:   Physical Exam Vitals reviewed.  Constitutional:      General: She is not in acute distress.    Appearance: She is well-developed.  HENT:     Head: Normocephalic and atraumatic.  Eyes:     Pupils: Pupils are equal, round, and reactive to light.  Neck:     Thyroid: No thyromegaly.  Cardiovascular:     Rate and Rhythm: Normal rate and regular rhythm.     Heart sounds: Normal heart sounds. No murmur heard. Pulmonary:     Effort: Pulmonary effort is normal. No respiratory distress.     Breath sounds: No wheezing or rhonchi.   Abdominal:     General: Bowel sounds are normal. There is no distension.     Palpations: Abdomen is soft.     Tenderness: There is no abdominal tenderness.  Musculoskeletal:        General: Tenderness present. Normal range of motion.     Cervical back: Normal range of motion and neck supple.     Comments: Left hand swelling, in sling, pain with abduction   Skin:    General: Skin is warm and dry.     Findings: Bruising present.     Comments: Skin tear present on left forearm, steri strips in place. No warmth or discharge present  Neurological:     Mental Status: She is alert and oriented to person, place, and time.     Cranial Nerves: No cranial nerve deficit.     Deep Tendon Reflexes: Reflexes are normal and symmetric.  Psychiatric:        Behavior: Behavior normal.        Thought Content: Thought content normal.        Judgment: Judgment normal.       BP 124/80   Pulse 73   Temp (!) 97 F (36.1 C) (Temporal)   Ht '5\' 4"'$  (1.626 m)   Wt 141 lb 6.4 oz (64.1 kg)   SpO2 97%   BMI 24.27 kg/m      Assessment &  Plan:  Sabrina [name] comes in today with chief complaint of Hospitalization Follow-up   Diagnosis and orders addressed:  1. Need for immunization against influenza - Flu Vaccine QUAD High Dose(Fluad)  2. Benign essential HTN  3. Hospital discharge follow-up  4. Closed fracture of proximal end of left humerus, unspecified fracture morphology, sequela   Keep follow up with Ortho Continue to wear sling Pt refuses any pain medications at this time because she is worried about becoming addicted. Education given. She want to only take her muscle relaxer.  Hospital and Ortho notes reviewed    Evelina Dun, FNP

## 2022-06-10 NOTE — Patient Instructions (Signed)
Humerus Fracture Rehab Ask your health care provider which exercises are safe for you. Do exercises exactly as told by your health care provider and adjust them as directed. It is normal to feel mild stretching, pulling, tightness, or discomfort as you do these exercises. Stop right away if you feel sudden pain or your pain gets worse. Do not begin these exercises until told by your health care provider. Stretching and range-of-motion exercises These exercises warm up your muscles and joints and improve the movement and flexibility of your arm and shoulder. These exercises can also help to relieve pain and stiffness. Shoulder pendulum  Stand near a table or counter that you can hold onto for balance. Bend forward at the waist and let your left / right arm hang straight down. Use your other arm to support you and help you stay balanced by holding on to the table or counter. Relax your left / right arm and shoulder muscles, and move your hips and your trunk so your left / right arm swings freely. Your arm should swing because of the motion of your body, not because you are using your arm or shoulder muscles. Keep moving your hips and trunk so your arm swings in the following directions, as told by your health care provider: Side to side. Forward and backward. In clockwise and counterclockwise circles. Slowly return to the starting position. Repeat __________ times. Complete this exercise __________ times a day. Wall crawl, shoulder flexion  Stand facing a wall. Put your left / right hand on the wall. Slide or crawl your left / right hand up the wall. Stop when you feel a stretch in your shoulder (flexion), or when you reach the angle that is recommended by your health care provider. Use your other hand to help raise your arm, if needed. As your hand gets higher, you may need to step closer to the wall. Avoid raising your shoulder as you raise your arm. Try to keep your shoulder blade tucked down  toward your spine. When you get your hand as high as you can, hold for __________ seconds. Slowly return to the starting position. Use your other arm to help, if needed. Repeat __________ times. Complete this exercise __________ times a day. Standing shoulder abduction, passive In this exercise, the injured shoulder relaxes (passive) while you use the healthy arm to push it away from your body (abduction). Stand and hold a broomstick, cane, or similar object with both hands. Place your hands a little more than shoulder-width apart. One hand should be palm-up, and your other hand should be palm-down. While keeping your elbow straight and your shoulder muscles relaxed, push the stick across your body toward your left / right side. Raise your left / right arm to the side of your body and then over your head until you feel a stretch in your shoulder. Stop when you reach the angle that is recommended by your health care provider. Avoid raising your shoulder while you raise your arm. Keep your shoulder blade tucked down toward the middle of your spine. Hold for __________ seconds. Slowly return to the starting position. Repeat __________ times. Complete this exercise __________ times a day. External rotation, passive  Sit in a stable chair without armrests, or stand up. Tuck a soft object, such as a folded towel or a small ball, under your left / right upper arm. Hold a broomstick, cane, or similar object with your palms face-down, toward the floor. Bend your elbows to a 90-degree angle (  right angle), and keep your hands about shoulder-width apart. With your uninjured arm, push the stick across your body. Keep your left / right arm bent. This will rotate your left / right forearm away from your body (external rotation). Hold for __________ seconds. Slowly return to the starting position. Repeat __________ times. Complete this exercise __________ times a day. Elbow extension Your health care provider  may have you do this exercise if your elbow is stiff. Stand or sit with your left / right elbow bent to a 90-degree angle (right angle). Position your forearm so that the thumb is facing the ceiling (neutral position). Straighten your left / right elbow as far as you can using only your arm muscles (extension). If your elbow is not fully straight, you can try to straighten your elbow farther by gently pushing down on your forearm with your other hand. Do this until you feel a gentle stretch on the inside of your left / right elbow. Hold for __________ seconds. Slowly return to the starting position. Repeat __________ times. Complete this exercise __________ times a day. Elbow flexion Your health care provider may have you do this exercise if your elbow is stiff. Stand or sit with your left / right elbow bent to a 90-degree angle (right angle). Position your forearm so that the thumb is facing the ceiling (neutral position). Bend your left / right elbow as far as you can using only your arm muscles (flexion). Aim to touch your left / right thumb to the same-side shoulder. If your elbow is not bending fully, you can try to bend your elbow farther by gently pushing on your wrist with your other hand to get your hand as close as possible to your same-side shoulder. Do this until you feel a gentle stretch on the back of your left / right elbow. Hold for __________ seconds. Slowly return to the starting position. Repeat __________ times. Complete this exercise __________ times a day. Strengthening exercises These exercises build strength and endurance in your arm and shoulder. Endurance is the ability to use your muscles for a long time, even after your muscles get tired. Do not start these exercises until told to do so by your health care provider. Internal rotation, isometric This is an exercise in which you press your palm against a door frame without moving your shoulder joint (isometric). Stand or  sit in a doorway, facing the door frame. Bend your left / right elbow, and place the palm of your hand against the door frame. Only your palm should be touching the frame. Keep your upper arm at your side. Gently press your hand against the door frame as if you are trying to push your arm toward your abdomen (internal rotation). Gradually increase the pressure until you are pressing as hard as you can comfortably tolerate. Stop increasing the pressure if you feel shoulder pain. Avoid raising your shoulder while you press your hand into the door frame. Keep your shoulder blade tucked down toward the middle of your back. Hold for __________ seconds. Slowly release the tension, and relax your muscles completely before you repeat the exercise. Repeat __________ times. Complete this exercise __________ times a day. External rotation, isometric This is an exercise in which you press the back of your wrist against a door frame without moving your shoulder joint (isometric). Stand or sit in a doorway, facing the door frame. Bend your left / right elbow and place the back of your wrist against the door frame.   Only the back of your wrist should be touching the frame. Keep your upper arm at your side. Gently press your wrist against the door frame, as if you are trying to push your arm away from your abdomen (external rotation). Gradually increase the pressure until you are pressing as hard as you can comfortably tolerate. Stop increasing the pressure if you feel pain. Avoid raising your shoulder while you press your wrist into the door frame. Keep your shoulder blade tucked down toward the middle of your back. Hold for __________ seconds. Slowly release the tension, and relax your muscles completely before you repeat the exercise. Repeat __________ times. Complete this exercise __________ times a day. Shoulder flexion, isometric Stand or sit in a doorway, facing the door frame. Keep your left / right arm  straight and make a gentle fist with your hand. Place your fist against the door frame. Only your fist should be touching the frame. Keep your upper arm at your side. Gently press your fist against the door frame, as if you are trying to raise your arm above your head (isometric shoulder flexion). Avoid raising your shoulder while you press your hand into the door frame. Keep your shoulder blade tucked down toward the middle of your back. Hold for __________ seconds. Slowly release the tension, and relax your muscles completely before you repeat the exercise. Repeat __________ times. Complete this exercise __________ times a day. Shoulder abduction, isometric  Stand or sit in a doorway. Your left / right arm should be closest to the door frame. Keep your left / right arm straight, and place the back of your hand against the door frame. Only your hand should be touching the frame. Keep the rest of your arm close to your side. Gently press the back of your hand against the door frame, as if you are trying to raise your arm out to the side (isometric shoulder abduction). Avoid raising your shoulder while you press your hand into the door frame. Keep your shoulder blade tucked down toward the middle of your back. Hold for __________ seconds. Slowly release the tension, and relax your muscles completely before you repeat the exercise. Repeat __________ times. Complete this exercise __________ times a day. Shoulder extension, isometric Stand or sit with your back to a wall. Keep your left / right arm straight and make a gentle fist with your hand. Place your fist against the wall. Only your fist should be touching the wall. Gently press your fist against the wall, as if you are trying to bring your arm straight out behind you (isometric extension). Avoid raising your shoulder while you press your hand into the wall. Keep your shoulder blade tucked down toward the middle of your back. Hold for  __________ seconds. Slowly release the tension, and relax your muscles completely before you repeat the exercise. Repeat __________ times. Complete this exercise __________ times a day. Grip strengthening  Hold one of these items in your left / right hand: a dense sponge, a stress ball, or a large, rolled sock. Slowly squeeze the object as hard as you can comfortably tolerate without increasing any pain. Hold your squeeze for __________ seconds. Slowly release your grip. Repeat __________ times. Complete this exercise __________ times a day. This information is not intended to replace advice given to you by your health care provider. Make sure you discuss any questions you have with your health care provider. Document Revised: 11/05/2020 Document Reviewed: 11/05/2020 Elsevier Patient Education  2023 [employer]  

## 2022-06-19 ENCOUNTER — Ambulatory Visit: Payer: Medicare HMO | Admitting: Physical Therapy

## 2022-06-19 NOTE — Therapy (Signed)
Patient into clinic with order for chronic midline thoracic back pain.  She is in a left shoulder sling.  She states that she sees Dr. Amedeo Kinsman next week and would like to wait on starting treatment until she is cleared to begin treatment to her left shoulder.  Mali Katarina Riebe MPT

## 2022-06-24 ENCOUNTER — Ambulatory Visit (INDEPENDENT_AMBULATORY_CARE_PROVIDER_SITE_OTHER): Payer: Medicare HMO | Admitting: Orthopedic Surgery

## 2022-06-24 ENCOUNTER — Ambulatory Visit (INDEPENDENT_AMBULATORY_CARE_PROVIDER_SITE_OTHER): Payer: Medicare HMO

## 2022-06-24 ENCOUNTER — Ambulatory Visit: Payer: Medicare HMO | Attending: Internal Medicine | Admitting: Internal Medicine

## 2022-06-24 ENCOUNTER — Encounter: Payer: Self-pay | Admitting: Internal Medicine

## 2022-06-24 ENCOUNTER — Encounter: Payer: Self-pay | Admitting: Orthopedic Surgery

## 2022-06-24 VITALS — BP 114/68 | HR 75 | Ht 64.0 in | Wt 141.6 lb

## 2022-06-24 DIAGNOSIS — I5032 Chronic diastolic (congestive) heart failure: Secondary | ICD-10-CM

## 2022-06-24 DIAGNOSIS — S42202D Unspecified fracture of upper end of left humerus, subsequent encounter for fracture with routine healing: Secondary | ICD-10-CM

## 2022-06-24 DIAGNOSIS — I442 Atrioventricular block, complete: Secondary | ICD-10-CM

## 2022-06-24 DIAGNOSIS — I4891 Unspecified atrial fibrillation: Secondary | ICD-10-CM | POA: Diagnosis not present

## 2022-06-24 NOTE — Patient Instructions (Signed)
Pendulum   Stand near a wall or a surface that you can hold onto for balance. Bend at the waist and let your left / right arm hang straight down. Use your other arm to support you. Keep your back straight and do not lock your knees. Relax your left / right arm and shoulder muscles, and move your hips and your trunk so your left / right arm swings freely. Your arm should swing because of the motion of your body, not because you are using your arm or shoulder muscles. Keep moving your hips and trunk so your arm swings in the following directions, as told by your health care provider: Side to side. Forward and backward. In clockwise and counterclockwise circles. Continue each motion for 20 seconds, or for as long as told by your health care provider. Slowly return to the starting position.  Repeat 10 times. Complete this exercise daily.  

## 2022-06-24 NOTE — Patient Instructions (Signed)
Medication Instructions:  Your physician recommends that you continue on your current medications as directed. Please refer to the Current Medication list given to you today.  *If you need a refill on your cardiac medications before your next appointment, please call your pharmacy*   Lab Work: None   If you have labs (blood work) drawn today and your tests are completely normal, you will receive your results only by: [place] (if you have MyChart) OR A paper copy in the mail If you have any lab test that is abnormal or we need to change your treatment, we will call you to review the results.   Testing/Procedures: NONE    Follow-Up: At [facility], you and your health needs are our priority.  As part of our continuing mission to provide you with exceptional heart care, we have created designated Provider Care Teams.  These Care Teams include your primary Cardiologist (physician) and Advanced Practice Providers (APPs -  Physician Assistants and Nurse Practitioners) who all work together to provide you with the care you need, when you need it.  We recommend signing up for the patient portal called "MyChart".  Sign up information is provided on this After Visit Summary.  MyChart is used to connect with patients for Virtual Visits (Telemedicine).  Patients are able to view lab/test results, encounter notes, upcoming appointments, etc.  Non-urgent messages can be sent to your provider as well.   To learn more about what you can do with MyChart, go to [email or web address].    Your next appointment:   1 year(s)  The format for your next appointment:   In Person  Provider:   [name], MD    Other Instructions Thank you for choosing [facility]!    Important Information About [employer]

## 2022-06-24 NOTE — Progress Notes (Signed)
Orthopaedic Clinic Return  Assessment: Sabrina [name] is a 76 y.o. female with the following: Left proximal humerus fracture   Plan: Sabrina [name] sustained a left proximal humerus fracture, 4 weeks ago.  Radiographs with further displacement compared to prior x-rays.  However, her pain is significantly improved.  At this point, we will allow her to remove the sling, to work on elbow, wrist and hand range of motion.  She can also initiate pendulums as tolerated.  We will see her back in 2 weeks.   Follow-up: Return in about 2 weeks (around 07/08/2022).   Subjective:  Chief Complaint  Patient presents with   Shoulder Injury    05/25/22 left shoulder     History of Present Illness: Sabrina [name] is a 76 y.o. female who returns to clinic for evaluation of left shoulder pain.  She fell and sustained a left proximal humerus fracture, approximately 1 month ago.  Her pain is much better.  She denies numbness and tingling.  She has been using her hand, notes improvement in her range of motion and function.  Skin tears have completely resolved.   Review of Systems: No fevers or chills No numbness or tingling No chest pain No shortness of breath No bowel or bladder dysfunction No GI distress No headaches   Objective: There were no vitals taken for this visit.  Physical Exam:  Elderly female.  Alert and oriented.  No acute distress.  Left arm without bruising.  Skin tears have healed.  Sensation is intact in the axillary nerve distribution.  She is able to wiggle her fingers and make a full fist.  2+ radial pulse.  IMAGING: I personally ordered and reviewed the following images:  X-rays of the left shoulder were obtained in clinic today.  These are compared to prior x-rays.  There has been slight interval displacement of the shaft in relation to the articular fragment.  Glenohumeral joint remains reduced.  No acute injuries are noted.  Impression: Left proximal humerus  fracture in acceptable alignment.    Mordecai Rasmussen, MD 06/24/2022 11:07 AM

## 2022-06-24 NOTE — Progress Notes (Signed)
HPI Ms. Ostrosky returns today for followup. She is a pleasant 76 yo woman with a h/o sinus node dysfunction, s/p PPM insertion. She has a h/o morbid obesity and has started diet and exercise therapy. She has lost almost 80 lbs in 3 years. She has had an episode of GI bleeding and her [place] was stopped.  Allergies  Allergen Reactions   Codeine Nausea And Vomiting    Stomach pain also     Current Outpatient Medications  Medication Sig Dispense Refill   albuterol (VENTOLIN HFA) 108 (90 Base) MCG/ACT inhaler INHALE 2 PUFFS BY MOUTH EVERY 6 HOURS AS NEEDED FOR WHEEZE OR SHORTNESS OF BREATH 1 each 1   alendronate (FOSAMAX) 70 MG tablet Take 1 tablet (70 mg total) by mouth every 7 (seven) days. Take with a full glass of water on an empty stomach. 4 tablet 11   aspirin EC 81 MG EC tablet Take 1 tablet (81 mg total) by mouth daily. Swallow whole. 30 tablet 11   atorvastatin (LIPITOR) 20 MG tablet Take 40 mg by mouth daily.     baclofen (LIORESAL) 10 MG tablet Take 0.5 tablets (5 mg total) by mouth 3 (three) times daily. 30 each 2   blood glucose meter kit and supplies Dispense based on patient and insurance preference. Use up to four times daily as directed. (FOR ICD-10 E10.9, E11.9). 1 each 0   Blood Glucose Monitoring Suppl (TRUE METRIX AIR GLUCOSE METER) w/Device KIT Test BS daily Dx R73.09 1 kit 0   cromolyn (NASALCROM) 5.2 MG/ACT nasal spray Place 1 spray into both nostrils 2 (two) times daily as needed for allergies.     cyanocobalamin (,VITAMIN B-12,) 1000 MCG/ML injection Inject 1,000 mcg into the muscle every 30 (thirty) days.     DULoxetine (CYMBALTA) 60 MG capsule Take 1 capsule (60 mg total) by mouth daily. 90 capsule 1   ferrous sulfate 325 (65 FE) MG tablet Take 325 mg by mouth 3 (three) times a week.     fluticasone (FLONASE) 50 MCG/ACT nasal spray SPRAY 2 SPRAYS INTO EACH NOSTRIL EVERY DAY (Patient taking differently: 2 sprays daily as needed for allergies.) 48 g 1   furosemide  (LASIX) 40 MG tablet Take 1 tablet (40 mg total) by mouth 2 (two) times daily. (May take an extra 40m as needed for leg swelling.)     gabapentin (NEURONTIN) 100 MG capsule TAKE 1 CAPSULE TWICE DAILY (NEED MD APPOINTMENT) (Patient taking differently: 100 mg at bedtime.) 180 capsule 0   glucose blood (TRUE METRIX BLOOD GLUCOSE TEST) test strip Test BS daily Dx R73.09 100 each 3   Multiple Vitamin (MULTIVITAMIN WITH MINERALS) TABS tablet Take 1 tablet by mouth in the morning.     nitroGLYCERIN (NITROSTAT) 0.4 MG SL tablet Place 1 tablet (0.4 mg total) under the tongue every 5 (five) minutes x 3 doses as needed for chest pain. 25 tablet 12   Omega-3 Fatty Acids (FISH OIL PO) Take 1 capsule by mouth daily.     pantoprazole (PROTONIX) 40 MG tablet TAKE 1 TABLET TWICE DAILY 180 tablet 0   potassium chloride SA (KLOR-CON M) 20 MEQ tablet Take 1 tablet (20 mEq total) by mouth 2 (two) times daily. Take 1 tablet twice a day 180 tablet 1   SYMBICORT 80-4.5 MCG/ACT inhaler Inhale 2 puffs into the lungs in the morning.     triamcinolone ointment (KENALOG) 0.5 % Apply 1 application topically 2 (two) times daily. (Patient taking differently:  Apply 1 application  topically 2 (two) times daily as needed (rash).) 30 g 0   TRUEplus Lancets 33G MISC Test BS daily Dx R73.09 100 each 3   vitamin C (ASCORBIC ACID) 500 MG tablet Take 500 mg by mouth daily.     Vitamin D, Ergocalciferol, (DRISDOL) 1.25 MG (50000 UNIT) CAPS capsule TAKE 1 CAPSULE (50,000 UNITS TOTAL) BY MOUTH EVERY 7 (SEVEN) DAYS. 12 capsule 0   Current Facility-Administered Medications  Medication Dose Route Frequency Provider Last Rate Last Admin   cyanocobalamin ((VITAMIN B-12)) injection 1,000 mcg  1,000 mcg Intramuscular Q30 days Evelina Dun A, FNP   1,000 mcg at 06/02/22 1609     Past Medical History:  Diagnosis Date   Anemia    Ankle fracture, right    past hx. -"no surgery"   Anxiety    Asthma    CHF (congestive heart failure) ([facility])  2009   Chronic lower back pain    Collagen vascular disease (HCC)    COPD (chronic obstructive pulmonary disease) (HCC)    Depression    Fibromyalgia    GERD (gastroesophageal reflux disease)    Hyperlipidemia    Hypertension    Immature cataract of both eyes    Myocardial infarction ([place])    "I've had a light one; don't know when it happened" (08/27/2017)   Osteoarthritis    Peripheral neuropathy    legs and feet   Persistent atrial fibrillation ([place])    Presence of Watchman left atrial appendage closure device 08/01/2021   24 mm Watchman FLXDevice LOT # [id number] by Dr. Quentin Ore   Tremors of nervous system    noted in hands by pt last 6 months   Tubular adenoma of colon     ROS:   All systems reviewed and negative except as noted in the HPI.   Past Surgical History:  Procedure Laterality Date   APPENDECTOMY     AV NODE ABLATION N/A 08/27/2017   Procedure: AV NODE ABLATION;  Surgeon: Evans Lance, MD;  Location: [facility];  Service: Cardiovascular;  Laterality: N/A;   CHOLECYSTECTOMY OPEN  1978   DILATION AND CURETTAGE OF UTERUS     FEMUR FRACTURE SURGERY Left 2013   "put 7" rod in it"   FRACTURE SURGERY     IVC FILTER REMOVAL N/A 06/20/2021   Procedure: IVC FILTER REMOVAL;  Surgeon: Marty Heck, MD;  Location: [facility];  Service: Cardiovascular;  Laterality: N/A;   LAPAROSCOPY  08/22/2016   Procedure: LAPAROSCOPY DIAGNOSTIC;  Surgeon: [name], MD;  Location: [facility];  Service: General;;   LEFT ATRIAL APPENDAGE OCCLUSION N/A 08/01/2021   Procedure: LEFT ATRIAL APPENDAGE OCCLUSION;  Surgeon: Vickie Epley, MD;  Location: [facility];  Service: Cardiovascular;  Laterality: N/A;   LEFT HEART CATH AND CORONARY ANGIOGRAPHY N/A 02/06/2022   Procedure: LEFT HEART CATH AND CORONARY ANGIOGRAPHY;  Surgeon: Troy Sine, MD;  Location: [facility];  Service: Cardiovascular;  Laterality: N/A;   MEDIAL PARTIAL KNEE REPLACEMENT Right  2005   "@ [facility]"   PACEMAKER IMPLANT N/A 08/27/2017   Procedure: PACEMAKER IMPLANT;  Surgeon: Evans Lance, MD;  Location: [facility];  Service: Cardiovascular;  Laterality: N/A;   ROUX-EN-Y GASTRIC BYPASS  2002   [place]  2002   TEE WITHOUT CARDIOVERSION N/A 08/01/2021   Procedure: TRANSESOPHAGEAL ECHOCARDIOGRAM (TEE);  Surgeon: Vickie Epley, MD;  Location: [facility];  Service: Cardiovascular;  Laterality: N/A;   TONSILLECTOMY  1944   TUBAL LIGATION     VAGINAL HYSTERECTOMY       Family History  Problem Relation Age of Onset   Cancer Mother    Alzheimer's disease Mother    Heart disease Mother    Arthritis Father    Asthma Daughter    Arthritis Daughter    Obesity Daughter    Colon cancer Paternal Uncle 85   Arthritis Son    Hyperlipidemia Son    Obesity Son    Arthritis Son    Obesity Son    BRCA 1/2 Neg Hx      Social History   Socioeconomic History   Marital status: Divorced    Spouse name: Not on file   Number of children: 3   Years of education: 2 years of college   Highest education level: Some college, no degree  Occupational History   Occupation: Retired  Tobacco Use   Smoking status: Former    Packs/day: 0.50    Years: 25.00    Total pack years: 12.50    Types: Cigarettes    Quit date: 02/26/1993    Years since quitting: 29.3   Smokeless tobacco: Never  Vaping Use   Vaping Use: Never used  Substance and Sexual Activity   Alcohol use: Not Currently    Comment: 08/27/2017 "nothing since early 2000s"   Drug use: Not Currently   Sexual activity: Not Currently    Birth control/protection: Surgical  Other Topics Concern   Not on file  Social History Narrative   ** Merged History Encounter **       Pt is right handed Lives in single story home with her grandson Has 3 adult children Associated degree  Retired Quarry manager   Social Determinants of Radio broadcast assistant Strain: [place]   (02/26/2022)   Overall Financial Resource Strain (CARDIA)    Difficulty of Paying Living Expenses: Not hard at all  Food Insecurity: No Food Insecurity (02/26/2022)   Hunger Vital Sign    Worried About Running Out of Food in the Last Year: Never true    [place] in the Last Year: Never true  Transportation Needs: No Transportation Needs (02/26/2022)   PRAPARE - Hydrologist (Medical): No    Lack of Transportation (Non-Medical): No  Physical Activity: Sufficiently Active (02/26/2022)   Exercise Vital Sign    Days of Exercise per Week: 4 days    Minutes of Exercise per Session: 60 min  Stress: No Stress Concern Present (02/26/2022)   [place]    Feeling of Stress : Not at all  Social Connections: Moderately Integrated (02/26/2022)   Social Connection and Isolation Panel [NHANES]    Frequency of Communication with Friends and Family: More than three times a week    Frequency of Social Gatherings with Friends and Family: More than three times a week    Attends Religious Services: More than 4 times per year    Active Member of [employer] or Organizations: Yes    Attends Archivist Meetings: More than 4 times per year    Marital Status: Divorced  Intimate Partner Violence: Not At Risk (02/26/2022)   Humiliation, Afraid, Rape, and Kick questionnaire    Fear of Current or Ex-Partner: No    Emotionally Abused: No    Physically Abused: No  Sexually Abused: No     BP 114/68   Pulse 75   Ht '5\' 4"'  (1.626 m)   Wt 141 lb 9.6 oz (64.2 kg)   SpO2 94%   BMI 24.31 kg/m   Physical Exam:  Well appearing NAD HEENT: Unremarkable Neck:  No JVD, no thyromegally Lymphatics:  No adenopathy Back:  No CVA tenderness Lungs:  Clear HEART:  Regular rate rhythm, no murmurs, no rubs, no clicks Abd:  soft, positive bowel sounds, no organomegally, no rebound, no guarding Ext:  2 plus pulses, no edema, no  cyanosis, no clubbing Skin:  No rashes no nodules Neuro:  CN II through XII intact, motor grossly intact  DEVICE  Normal device function.  See PaceArt for details.   Assess/Plan:   Atrial fib - her rates are well controlled. She PPM - her medtronic DDD PM is working normally. She is pacing in her His bundle. She is s/p Watchman device.  HTN - her bp is well controlled. We will follow. Obesity - she has lost almost 80 lbs. No indication for more weight loss.   Carleene Overlie Raydin Bielinski,MD

## 2022-07-03 ENCOUNTER — Ambulatory Visit (INDEPENDENT_AMBULATORY_CARE_PROVIDER_SITE_OTHER): Payer: Medicare HMO | Admitting: *Deleted

## 2022-07-03 DIAGNOSIS — E538 Deficiency of other specified B group vitamins: Secondary | ICD-10-CM

## 2022-07-08 ENCOUNTER — Ambulatory Visit (INDEPENDENT_AMBULATORY_CARE_PROVIDER_SITE_OTHER): Payer: Medicare HMO | Admitting: Orthopedic Surgery

## 2022-07-08 ENCOUNTER — Encounter: Payer: Self-pay | Admitting: Orthopedic Surgery

## 2022-07-08 ENCOUNTER — Ambulatory Visit (INDEPENDENT_AMBULATORY_CARE_PROVIDER_SITE_OTHER): Payer: Medicare HMO

## 2022-07-08 DIAGNOSIS — S42202D Unspecified fracture of upper end of left humerus, subsequent encounter for fracture with routine healing: Secondary | ICD-10-CM

## 2022-07-08 NOTE — Progress Notes (Unsigned)
Orthopaedic Clinic Return  Assessment: Sabrina [name] is a 76 y.o. female with the following: Left proximal humerus fracture   Plan: Sabrina [name] sustained a left proximal humerus fracture, 6 weeks ago.  Radiographs are stable, without further displacement.  Her pain is improved.  We will allow her to start coming out of the sling, and initiate passive range of motion.  We will place a referral to physical therapy.  She will return to clinic in approximate 1 month for repeat evaluation.  If she has any issues in the interim, she will contact the clinic.   Follow-up: Return in about 4 weeks (around 08/05/2022).   Subjective:  Chief Complaint  Patient presents with   Shoulder Injury    Left /states she is improving date of injury 05/25/22     History of Present Illness: Sabrina [name] is a 76 y.o. female who returns to clinic for evaluation of left shoulder pain.  She fell and sustained a left proximal humerus fracture, approximately 6 weeks ago.  Her pain and swelling is much better.  Since I last saw her, she has been coming out of her sling to complete pendulum exercises, as well is range of motion of her elbow and hand.  She has tolerated this well.  No new injuries.  No numbness or tingling.   Review of Systems: No fevers or chills No numbness or tingling No chest pain No shortness of breath No bowel or bladder dysfunction No GI distress No headaches   Objective: There were no vitals taken for this visit.  Physical Exam:  Elderly female.  Alert and oriented.  No acute distress.  Left arm without bruising.  Skin tears have healed.  Sensation is intact in the axillary nerve distribution.  She is able to wiggle her fingers and make a full fist.  2+ radial pulse.  IMAGING: I personally ordered and reviewed the following images:  X-rays of the left shoulder were obtained in clinic today.  These were compared to prior x-rays.  There has been no further  displacement of the proximal humerus fracture.  Overall alignment is slightly improved compared to prior x-rays.  Glenohumeral joint is reduced.  No acute injuries.  Impression: Left proximal humerus fracture in stable alignment.    Mordecai Rasmussen, MD 07/09/2022 8:28 AM

## 2022-07-14 DIAGNOSIS — H524 Presbyopia: Secondary | ICD-10-CM | POA: Diagnosis not present

## 2022-07-14 DIAGNOSIS — H2513 Age-related nuclear cataract, bilateral: Secondary | ICD-10-CM | POA: Diagnosis not present

## 2022-07-14 DIAGNOSIS — H43813 Vitreous degeneration, bilateral: Secondary | ICD-10-CM | POA: Diagnosis not present

## 2022-07-14 DIAGNOSIS — H52223 Regular astigmatism, bilateral: Secondary | ICD-10-CM | POA: Diagnosis not present

## 2022-07-14 DIAGNOSIS — E119 Type 2 diabetes mellitus without complications: Secondary | ICD-10-CM | POA: Diagnosis not present

## 2022-08-04 ENCOUNTER — Ambulatory Visit (INDEPENDENT_AMBULATORY_CARE_PROVIDER_SITE_OTHER): Payer: Medicare HMO

## 2022-08-04 DIAGNOSIS — E538 Deficiency of other specified B group vitamins: Secondary | ICD-10-CM

## 2022-08-04 NOTE — Progress Notes (Signed)
Cyanocobalamin injection given to right deltoid.  Patient tolerated well. 

## 2022-08-05 ENCOUNTER — Other Ambulatory Visit: Payer: Self-pay | Admitting: Family

## 2022-08-13 ENCOUNTER — Other Ambulatory Visit: Payer: Self-pay | Admitting: Family

## 2022-08-13 DIAGNOSIS — F411 Generalized anxiety disorder: Secondary | ICD-10-CM

## 2022-08-19 ENCOUNTER — Other Ambulatory Visit: Payer: Self-pay | Admitting: Family

## 2022-08-19 DIAGNOSIS — J4520 Mild intermittent asthma, uncomplicated: Secondary | ICD-10-CM

## 2022-08-19 DIAGNOSIS — K219 Gastro-esophageal reflux disease without esophagitis: Secondary | ICD-10-CM

## 2022-08-21 ENCOUNTER — Other Ambulatory Visit: Payer: Self-pay | Admitting: Family

## 2022-08-23 ENCOUNTER — Other Ambulatory Visit: Payer: Self-pay | Admitting: Family

## 2022-08-23 DIAGNOSIS — G8929 Other chronic pain: Secondary | ICD-10-CM

## 2022-09-01 ENCOUNTER — Telehealth: Payer: Self-pay | Admitting: Radiology

## 2022-09-01 NOTE — Telephone Encounter (Signed)
FYI only, patient has declined to schedule therapy for her shoulder/ I am closing referral they tried to reach multiple times with no return calls

## 2022-09-04 ENCOUNTER — Ambulatory Visit (INDEPENDENT_AMBULATORY_CARE_PROVIDER_SITE_OTHER): Payer: Medicare HMO | Admitting: *Deleted

## 2022-09-04 DIAGNOSIS — E538 Deficiency of other specified B group vitamins: Secondary | ICD-10-CM

## 2022-09-26 ENCOUNTER — Ambulatory Visit (INDEPENDENT_AMBULATORY_CARE_PROVIDER_SITE_OTHER): Payer: Medicare HMO

## 2022-09-26 DIAGNOSIS — I442 Atrioventricular block, complete: Secondary | ICD-10-CM

## 2022-09-26 LAB — CUP PACEART REMOTE DEVICE CHECK
Battery Remaining Longevity: 45 mo
Battery Voltage: 2.88 V
Brady Statistic AP VP Percent: 0.53 %
Brady Statistic AP VS Percent: 99.25 %
Brady Statistic AS VP Percent: 0 %
Brady Statistic AS VS Percent: 0.22 %
Brady Statistic RA Percent Paced: 99.98 %
Brady Statistic RV Percent Paced: 0.53 %
Date Time Interrogation Session: 20240105005341
Implantable Lead Connection Status: 753985
Implantable Lead Connection Status: 753985
Implantable Lead Implant Date: 20181206
Implantable Lead Implant Date: 20181206
Implantable Lead Location: 753860
Implantable Lead Location: 753860
Implantable Lead Model: 3830
Implantable Lead Model: 5076
Implantable Pulse Generator Implant Date: 20181206
Lead Channel Impedance Value: 247 Ohm
Lead Channel Impedance Value: 304 Ohm
Lead Channel Impedance Value: 399 Ohm
Lead Channel Impedance Value: 418 Ohm
Lead Channel Sensing Intrinsic Amplitude: 10.5 mV
Lead Channel Sensing Intrinsic Amplitude: 3.625 mV
Lead Channel Sensing Intrinsic Amplitude: 3.625 mV
Lead Channel Sensing Intrinsic Amplitude: 3.75 mV
Lead Channel Setting Pacing Amplitude: 2 V
Lead Channel Setting Pacing Amplitude: 2.5 V
Lead Channel Setting Pacing Pulse Width: 0.3 ms
Lead Channel Setting Sensing Sensitivity: 2 mV
Zone Setting Status: 755011
Zone Setting Status: 755011

## 2022-09-29 ENCOUNTER — Telehealth: Payer: Self-pay | Admitting: Internal Medicine

## 2022-09-29 NOTE — Telephone Encounter (Signed)
  1. Has your device fired?   2. Is you device beeping?   3. Are you experiencing draining or swelling at device site?  No  4. Are you calling to see if we received your device transmission?  No   5. Have you passed out?  No   Patient's transmission was not received. She states her monitor is not working at all when plugged in. Called device clinic, but no answer.

## 2022-09-29 NOTE — Telephone Encounter (Signed)
Noted, forwarding to CMA to reach out to patient and assist.  I did call patient this pm to ensure she was not having any symptoms.  Patient is doing fine, no symptoms just noticed her remote monitor stopped working. She will await CMA's call tomorrow.

## 2022-09-30 NOTE — Telephone Encounter (Signed)
LMOVM for patient to give [place] a call back.  Last transmission was received 09/26/2022 at 12:53 am.  She may just need a new handheld for her monitor.

## 2022-10-01 ENCOUNTER — Ambulatory Visit (INDEPENDENT_AMBULATORY_CARE_PROVIDER_SITE_OTHER): Payer: Medicare HMO | Admitting: *Deleted

## 2022-10-01 DIAGNOSIS — E538 Deficiency of other specified B group vitamins: Secondary | ICD-10-CM | POA: Diagnosis not present

## 2022-10-01 NOTE — Progress Notes (Signed)
B12 injection given, right deltoid, intramuscular. Patient tolerated well

## 2022-10-01 NOTE — Telephone Encounter (Signed)
Patient called back and states [employer] was suppose to call her back about the handheld. I have called medtronic for her and they are sending her a new handheld and it will take 7-10 business days and patient is aware

## 2022-10-14 NOTE — Progress Notes (Signed)
Remote pacemaker transmission.   

## 2022-10-25 ENCOUNTER — Other Ambulatory Visit: Payer: Self-pay | Admitting: Family

## 2022-10-25 DIAGNOSIS — F411 Generalized anxiety disorder: Secondary | ICD-10-CM

## 2022-10-28 ENCOUNTER — Other Ambulatory Visit: Payer: Self-pay | Admitting: Family

## 2022-10-28 DIAGNOSIS — E782 Mixed hyperlipidemia: Secondary | ICD-10-CM

## 2022-11-03 ENCOUNTER — Ambulatory Visit (INDEPENDENT_AMBULATORY_CARE_PROVIDER_SITE_OTHER): Payer: Medicare HMO | Admitting: *Deleted

## 2022-11-03 DIAGNOSIS — E538 Deficiency of other specified B group vitamins: Secondary | ICD-10-CM

## 2022-11-03 NOTE — Progress Notes (Unsigned)
B12 1000 MCG GIVEN IM IN LEFT DELTOID. PATIENT TOLERATED WELL.

## 2022-11-06 IMAGING — CT CT HEART MORPH/PULM VEIN W/ CM & W/O CA SCORE
1 of 5 series · 8 of 20 positions shown, 10 images · IV contrast (APPLIED)
Comparison: May 15, 2021.

Addendum:
CLINICAL DATA: S/p left atrial appendage closure with 24 mm
Udu Babs (08/01/2021)

EXAM:
Cardiac CT/CTA
TECHNIQUE: A non-contrast, gated CT scan was obtained with axial slices of 3 mm
through the heart for calcium scoring. Calcium scoring was performed
using the Agatston method. A 120 kV prospective, gated, contrast
cardiac scan was obtained. Gantry rotation speed was 250 msecs and
collimation was 0.6 mm. Nitroglycerin was not given. A delayed scan
was obtained to exclude left atrial appendage thrombus. The 3D
dataset was reconstructed in 5% intervals of the 25-50% of the R-R
cycle. Late systolic phases were analyzed on a dedicated workstation
using MPR, MIP, and VRT modes. The patient received 80 cc of
contrast.

[Series 6: best syst · axial · 0.39mm/px · z∈[+1065,+1153]mm · 8 of 283 slices shown, 10 images]
[im 32/283  vessel]
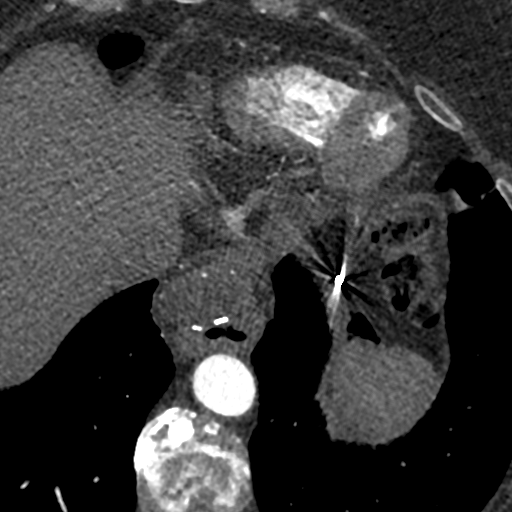
[im 32/283  lung]
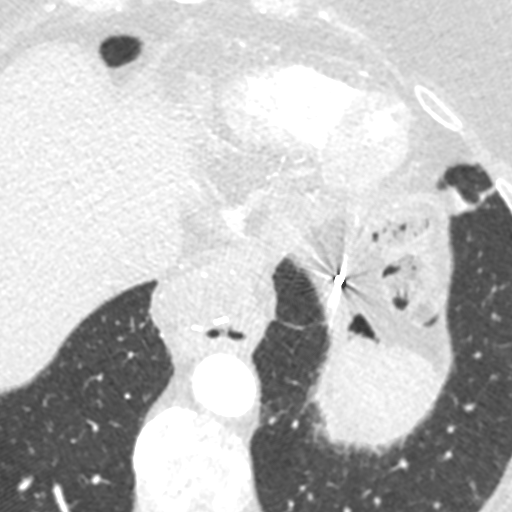
[im 63/283  vessel]
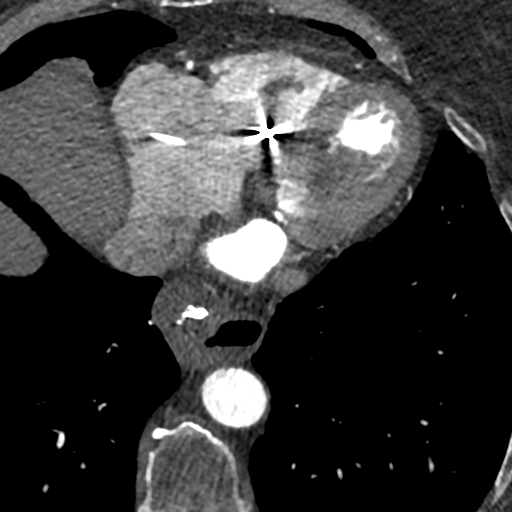
[im 95/283  vessel]
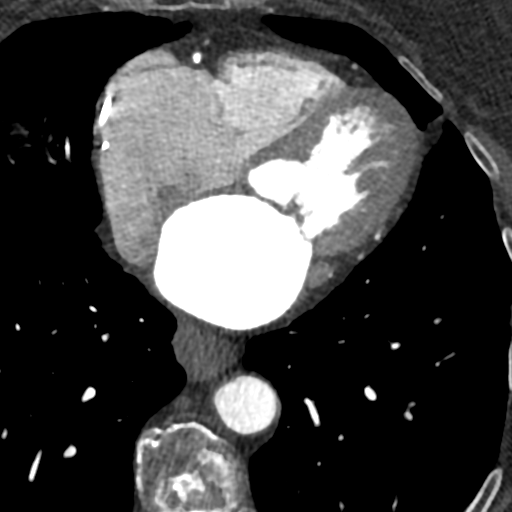
[im 126/283  vessel]
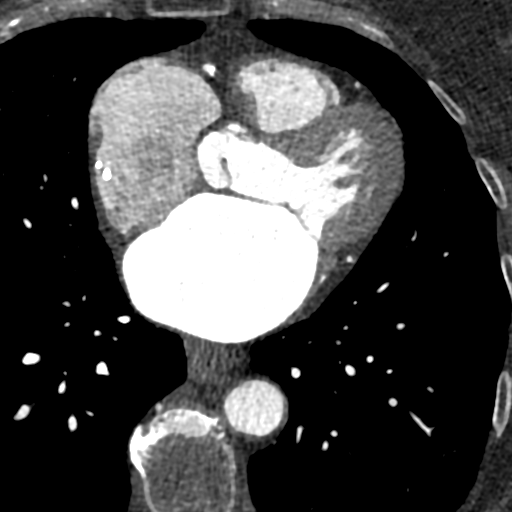
[im 157/283  vessel]
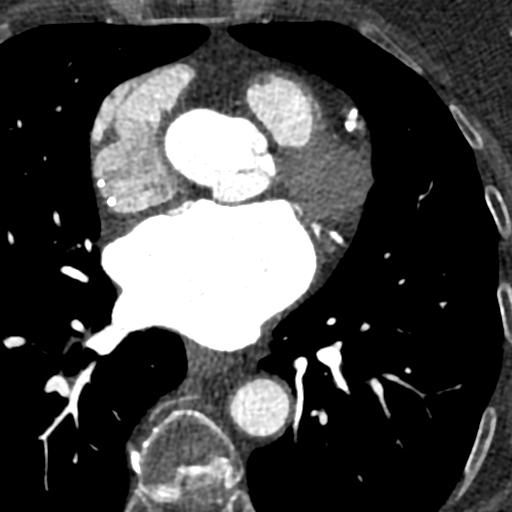
[im 157/283  lung]
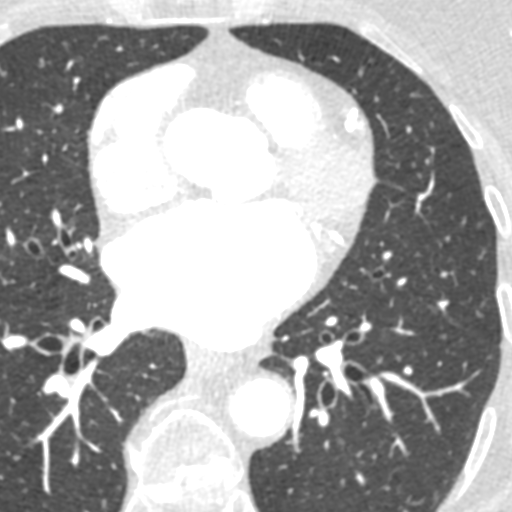
[im 189/283  vessel]
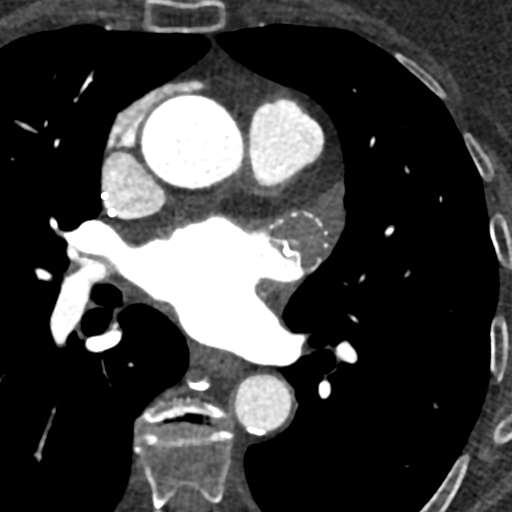
[im 220/283  vessel]
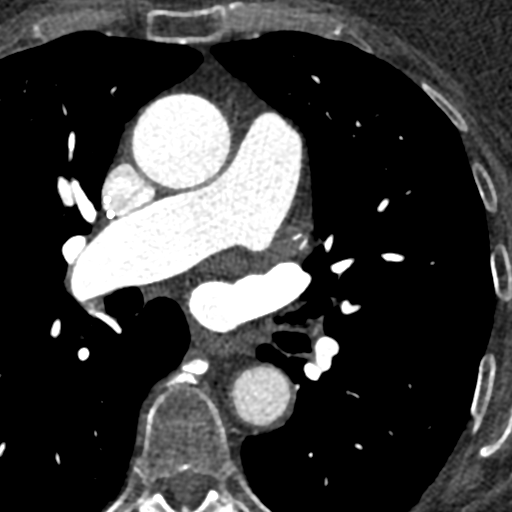
[im 251/283  vessel]
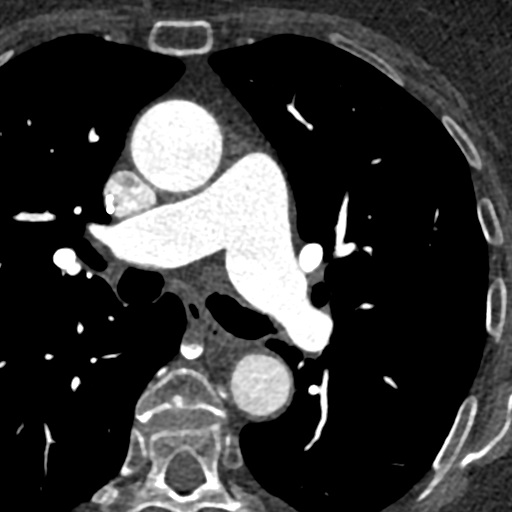

[8 of 20 positions shown; findings below may reference images not displayed]

FINDINGS: Image quality: excellent,.

Noise artifact is: Limited.

Left atrial appendage: A 24 mm Udu Babs is present (Implant
08/01/2021). The device is well seated. There is no device related
thrombus or device leak present. There is appropriate thrombus in
the device and the appendage is successfully closed.

Left Atrium: The left atrial size is dilated. A small PFO is
present. There is normal pulmonary vein drainage into the left
atrium (2 on the right and 2 on the left).

Coronary Arteries: Calcium score not performed as pacer leads and
watchman flex interfere with this measurement. 3-vessel coronary
calcifications noted. Normal coronary origin. Right dominance. The
study was performed without use of NTG and is insufficient for
plaque evaluation.

Right Atrium: Right atrial size is dilated. CIED leads are present
in the RA/RV.

Right Ventricle: The right ventricular cavity is within normal
limits.

Left Ventricle: The ventricular cavity size is within normal limits.

Pulmonary Artery: Normal caliber without proximal filling defect.

Cardiac valves: The aortic valve is trileaflet without significant
calcification. The mitral valve is normal structure without
significant calcification.

Aorta: Normal caliber with no significant disease.

Pericardium: Normal thickness with no significant effusion or
calcium present.

Extra-cardiac findings: See attached radiology report for
non-cardiac structures.
IMPRESSION: 1. A left atrial appendage closure device is present with normal
appearance indicating successful closure of the HIEU. There is no
device related thrombus and no device leak.

2. 3-vessel coronary calcifications noted (unable to perform calcium
score due to CIED leads/left atrial appendage closure device
interference).

3. Normal pulmonary vein anatomy.

EXAM:
OVER-READ INTERPRETATION  CT CHEST

The following report is an over-read performed by radiologist Dr.
over-read does not include interpretation of cardiac or coronary
anatomy or pathology. The coronary calcium and atrial mapping.
Interpretation by the cardiologist is attached.
FINDINGS: Cardiovascular: See cardiac report for further details regarding
cardiac structures. A Watchman device in the LEFT atrial appendage.

Mediastinum/Nodes: Postoperative changes about the distal esophagus.
This is likely related to reported prior gastric bypass,
incompletely imaged. No adenopathy in the visible portions of the
chest, imaging limited to the mid chest and cardiac structures on
today's study.

Lungs/Pleura: Lungs are clear and airways are patent in visualized
portions of the chest. No effusion. No consolidative process.

Upper Abdomen: Incidental imaging of upper abdominal contents
without acute process very limited assessment on today's study.

Musculoskeletal: No acute bone finding. No destructive bone process.
Spinal degenerative changes.
IMPRESSION: No acute or significant extracardiac findings.

Postoperative changes about the distal esophagus, likely related to
reported prior gastric bypass, incompletely imaged.

*** End of Addendum ***
FINDINGS: Image quality: excellent,.

Noise artifact is: Limited.

Left atrial appendage: A 24 mm Udu Babs is present (Implant
08/01/2021). The device is well seated. There is no device related
thrombus or device leak present. There is appropriate thrombus in
the device and the appendage is successfully closed.

Left Atrium: The left atrial size is dilated. A small PFO is
present. There is normal pulmonary vein drainage into the left
atrium (2 on the right and 2 on the left).

Coronary Arteries: Calcium score not performed as pacer leads and
watchman flex interfere with this measurement. 3-vessel coronary
calcifications noted. Normal coronary origin. Right dominance. The
study was performed without use of NTG and is insufficient for
plaque evaluation.

Right Atrium: Right atrial size is dilated. CIED leads are present
in the RA/RV.

Right Ventricle: The right ventricular cavity is within normal
limits.

Left Ventricle: The ventricular cavity size is within normal limits.

Pulmonary Artery: Normal caliber without proximal filling defect.

Cardiac valves: The aortic valve is trileaflet without significant
calcification. The mitral valve is normal structure without
significant calcification.

Aorta: Normal caliber with no significant disease.

Pericardium: Normal thickness with no significant effusion or
calcium present.

Extra-cardiac findings: See attached radiology report for
non-cardiac structures.
IMPRESSION: 1. A left atrial appendage closure device is present with normal
appearance indicating successful closure of the HIEU. There is no
device related thrombus and no device leak.

2. 3-vessel coronary calcifications noted (unable to perform calcium
score due to CIED leads/left atrial appendage closure device
interference).

3. Normal pulmonary vein anatomy.

## 2022-11-10 ENCOUNTER — Other Ambulatory Visit: Payer: Self-pay | Admitting: Family

## 2022-11-10 DIAGNOSIS — G8929 Other chronic pain: Secondary | ICD-10-CM

## 2022-11-11 NOTE — Telephone Encounter (Signed)
Last office visit 06/10/22 Last refill 08/25/22, #90, 1 refill

## 2022-11-12 ENCOUNTER — Encounter: Payer: Self-pay | Admitting: Cardiology

## 2022-11-12 ENCOUNTER — Telehealth: Payer: Self-pay

## 2022-11-12 ENCOUNTER — Ambulatory Visit: Payer: Medicare HMO | Attending: Cardiology | Admitting: Cardiology

## 2022-11-12 VITALS — BP 110/64 | HR 82 | Ht 64.0 in | Wt 135.0 lb

## 2022-11-12 DIAGNOSIS — I4891 Unspecified atrial fibrillation: Secondary | ICD-10-CM | POA: Diagnosis not present

## 2022-11-12 DIAGNOSIS — M79604 Pain in right leg: Secondary | ICD-10-CM

## 2022-11-12 DIAGNOSIS — M79605 Pain in left leg: Secondary | ICD-10-CM

## 2022-11-12 DIAGNOSIS — R600 Localized edema: Secondary | ICD-10-CM | POA: Diagnosis not present

## 2022-11-12 DIAGNOSIS — I2510 Atherosclerotic heart disease of native coronary artery without angina pectoris: Secondary | ICD-10-CM

## 2022-11-12 MED ORDER — ATORVASTATIN CALCIUM 20 MG PO TABS: 20.0000 mg | ORAL_TABLET | Freq: Every day | ORAL | 2 refills | Status: DC

## 2022-11-12 NOTE — Patient Instructions (Addendum)
Medication Instructions:  Your physician has recommended you make the following change in your medication:  Decrease atorvastatin to 20 mg daily Continue other medications the same  Labwork: none  Testing/Procedures: none  Follow-Up: Your physician recommends that you schedule a follow-up appointment in: 6 months  Any Other Special Instructions Will Be Listed Below (If Applicable). You have been referred to Vascular Surgeon  The device clinic has been contacted about your home monitoring problem.  If you need a refill on your cardiac medications before your next appointment, please call your pharmacy.

## 2022-11-12 NOTE — Telephone Encounter (Signed)
Helped patient send a transmission. Transmission received 11/12/2022.

## 2022-11-12 NOTE — Telephone Encounter (Signed)
LMOVM for patient to call the device clinic to get help with her home remote monitor.

## 2022-11-12 NOTE — Progress Notes (Signed)
Clinical Summary Sabrina [name] is a 77 y.o.female seen today for follow up of the following medical problems.   1.Persistent afib/pacemaker - s/p av nodal ablation and pacemaker - Gi bleeding issues on anticoag, now with watchman device.     - no recent palpitations - compliant with meds   2. LE edema/chronic diastolic HF - some recent issues with leg edema.  - taking lasix 72m daily   05/2016 60-65%, no WMAs, mild MR, normal RV  -no recent edema     3.Chest pain/CAD - 01/2022 cath without obstructive disease - no symptoms     4. Dizziness - occurs with standing.      5. Hyperlipidemia - 01/2022 TC 119 TG 69 HDL 54 LDL 51 - taking atorva 40 a day, had been on 265mand done well. Unclear how and when changed  6. Leg pains - intermittent bluish discoloration of toes bilterally with associated pain    Doing silver sneakers at the gym Past Medical History:  Diagnosis Date   Anemia    Ankle fracture, right    past hx. -"no surgery"   Anxiety    Asthma    CHF (congestive heart failure) ([facility]   Chronic lower back pain    Collagen vascular disease (HCC)    COPD (chronic obstructive pulmonary disease) (HCC)    Depression    Fibromyalgia    GERD (gastroesophageal reflux disease)    Hyperlipidemia    Hypertension    Immature cataract of both eyes    Myocardial infarction ([facility]   "I've had a light one; don't know when it happened" (08/27/2017)   Osteoarthritis    Peripheral neuropathy    legs and feet   Persistent atrial fibrillation ([facility]   Presence of Watchman left atrial appendage closure device 08/01/2021   24 mm Watchman FLXDevice LOT # [id number] Dr. LaQuentin Ore Tremors of nervous system    noted in hands by pt last 6 months   Tubular adenoma of colon      Allergies  Allergen Reactions   Codeine Nausea And Vomiting    Stomach pain also     Current Outpatient Medications  Medication Sig Dispense Refill   albuterol (VENTOLIN HFA) 108  (90 Base) MCG/ACT inhaler INHALE 2 PUFFS BY MOUTH EVERY 6 HOURS AS NEEDED FOR WHEEZE OR SHORTNESS OF BREATH 1 each 1   alendronate (FOSAMAX) 70 MG tablet Take 1 tablet (70 mg total) by mouth every 7 (seven) days. Take with a full glass of water on an empty stomach. 4 tablet 11   aspirin EC 81 MG EC tablet Take 1 tablet (81 mg total) by mouth daily. Swallow whole. 30 tablet 11   atorvastatin (LIPITOR) 20 MG tablet TAKE 1 TABLET BY MOUTH EVERYDAY AT BEDTIME 90 tablet 1   baclofen (LIORESAL) 10 MG tablet TAKE 0.5 TABLET (5 MG TOTAL) BY MOUTH 3 (THREE) TIMES DAILY. 135 tablet 1   blood glucose meter kit and supplies Dispense based on patient and insurance preference. Use up to four times daily as directed. (FOR ICD-10 E10.9, E11.9). 1 each 0   Blood Glucose Monitoring Suppl (TRUE METRIX AIR GLUCOSE METER) w/Device KIT Test BS daily Dx R73.09 1 kit 0   cromolyn (NASALCROM) 5.2 MG/ACT nasal spray Place 1 spray into both nostrils 2 (two) times daily as needed for allergies.     cyanocobalamin (,VITAMIN B-12,) 1000 MCG/ML injection Inject 1,000 mcg into the muscle every 30 (thirty)  days.     DULoxetine (CYMBALTA) 60 MG capsule TAKE 1 CAPSULE EVERY DAY (NEEDS TO BE SEEN BEFORE NEXT REFILL) 90 capsule 0   ferrous sulfate 325 (65 FE) MG tablet Take 325 mg by mouth 3 (three) times a week.     fluticasone (FLONASE) 50 MCG/ACT nasal spray USE 2 SPRAYS IN EACH NOSTRIL EVERY DAY 48 g 0   furosemide (LASIX) 40 MG tablet Take 1 tablet (40 mg total) by mouth 2 (two) times daily. (May take an extra 2m as needed for leg swelling.)     gabapentin (NEURONTIN) 100 MG capsule TAKE 1 CAPSULE TWICE DAILY (NEED MD APPOINTMENT) (Patient taking differently: 100 mg at bedtime.) 180 capsule 0   glucose blood (TRUE METRIX BLOOD GLUCOSE TEST) test strip Test BS daily Dx R73.09 100 each 3   Multiple Vitamin (MULTIVITAMIN WITH MINERALS) TABS tablet Take 1 tablet by mouth in the morning.     nitroGLYCERIN (NITROSTAT) 0.4 MG SL tablet  Place 1 tablet (0.4 mg total) under the tongue every 5 (five) minutes x 3 doses as needed for chest pain. 25 tablet 12   Omega-3 Fatty Acids (FISH OIL PO) Take 1 capsule by mouth daily.     pantoprazole (PROTONIX) 40 MG tablet TAKE 1 TABLET TWICE DAILY 180 tablet 0   potassium chloride SA (KLOR-CON M) 20 MEQ tablet Take 1 tablet (20 mEq total) by mouth 2 (two) times daily. Take 1 tablet twice a day 180 tablet 1   SYMBICORT 80-4.5 MCG/ACT inhaler Inhale 2 puffs into the lungs in the morning.     triamcinolone ointment (KENALOG) 0.5 % Apply 1 application topically 2 (two) times daily. (Patient taking differently: Apply 1 application  topically 2 (two) times daily as needed (rash).) 30 g 0   TRUEplus Lancets 33G MISC Test BS daily Dx R73.09 100 each 3   vitamin C (ASCORBIC ACID) 500 MG tablet Take 500 mg by mouth daily.     Vitamin D, Ergocalciferol, (DRISDOL) 1.25 MG (50000 UNIT) CAPS capsule TAKE ONE CAPSULE (50,000 UNITS DOSE) BY MOUTH ONCE A WEEK. WEDNESDAY 12 capsule 1   Current Facility-Administered Medications  Medication Dose Route Frequency Provider Last Rate Last Admin   cyanocobalamin ((VITAMIN B-12)) injection 1,000 mcg  1,000 mcg Intramuscular Q30 days HSharion Balloon FNP   1,000 mcg at 11/03/22 1435     Past Surgical History:  Procedure Laterality Date   APPENDECTOMY     AV NODE ABLATION N/A 08/27/2017   Procedure: AV NODE ABLATION;  Surgeon: TEvans Lance MD;  [facility];  Service: Cardiovascular;  Laterality: N/A;   CHOLECYSTECTOMY OPEN  1978   DILATION AND CURETTAGE OF UTERUS     FEMUR FRACTURE SURGERY Left 2013   "put 7" rod in it"   FRACTURE SURGERY     IVC FILTER REMOVAL N/A 06/20/2021   Procedure: IVC FILTER REMOVAL;  Surgeon: CMarty Heck MD;  [facility];  Service: Cardiovascular;  Laterality: N/A;   LAPAROSCOPY  08/22/2016   Procedure: LAPAROSCOPY DIAGNOSTIC;  Surgeon: ALeighton Ruff MD;  Location: [facility];  Service:  General;;   LEFT ATRIAL APPENDAGE OCCLUSION N/A 08/01/2021   Procedure: LEFT ATRIAL APPENDAGE OCCLUSION;  Surgeon: LVickie Epley MD;  [facility];  Service: Cardiovascular;  Laterality: N/A;   LEFT HEART CATH AND CORONARY ANGIOGRAPHY N/A 02/06/2022   Procedure: LEFT HEART CATH AND CORONARY ANGIOGRAPHY;  Surgeon: KTroy Sine MD;  [facility];  Service: Cardiovascular;  Laterality: N/A;   MEDIAL PARTIAL KNEE REPLACEMENT Right 2005   "@ [facility]"   PACEMAKER IMPLANT N/A 08/27/2017   Procedure: PACEMAKER IMPLANT;  Surgeon: Evans Lance, MD;  Location: [facility] [facility];  Service: Cardiovascular;  Laterality: N/A;   ROUX-EN-Y GASTRIC BYPASS  2002   [place]  2002   TEE WITHOUT CARDIOVERSION N/A 08/01/2021   Procedure: TRANSESOPHAGEAL ECHOCARDIOGRAM (TEE);  Surgeon: Vickie Epley, MD;  Location: [place] [facility];  Service: Cardiovascular;  Laterality: N/A;   TONSILLECTOMY  1944   TUBAL LIGATION     VAGINAL HYSTERECTOMY       Allergies  Allergen Reactions   Codeine Nausea And Vomiting    Stomach pain also      Family History  Problem Relation Age of Onset   Cancer Mother    Alzheimer's disease Mother    Heart disease Mother    Arthritis Father    Asthma Daughter    Arthritis Daughter    Obesity Daughter    Colon cancer Paternal Uncle 1   Arthritis Son    Hyperlipidemia Son    Obesity Son    Arthritis Son    Obesity Son    BRCA 1/2 Neg Hx      Social History Ms. Tow reports that she quit smoking about 29 years ago. Her smoking use included cigarettes. She has a 12.50 pack-year smoking history. She has never used smokeless tobacco. Ms. Jarema reports that she does not currently use alcohol.   Review of Systems CONSTITUTIONAL: No weight loss, fever, chills, weakness or fatigue.  HEENT: Eyes: No visual loss, blurred vision, double vision or yellow sclerae.No hearing loss, sneezing,  congestion, runny nose or sore throat.  SKIN: No rash or itching.  CARDIOVASCULAR: per hpi RESPIRATORY: No shortness of breath, cough or sputum.  GASTROINTESTINAL: No anorexia, nausea, vomiting or diarrhea. No abdominal pain or blood.  GENITOURINARY: No burning on urination, no polyuria NEUROLOGICAL: No headache, dizziness, syncope, paralysis, ataxia, numbness or tingling in the extremities. No change in bowel or bladder control.  MUSCULOSKELETAL: +foot pain LYMPHATICS: No enlarged nodes. No history of splenectomy.  PSYCHIATRIC: No history of depression or anxiety.  ENDOCRINOLOGIC: No reports of sweating, cold or heat intolerance. No polyuria or polydipsia.  Marland Kitchen   Physical Examination Today's Vitals   11/12/22 1342  BP: 110/64  Pulse: 82  SpO2: 97%  Weight: 135 lb (61.2 kg)  Height: 5' 4"$  (1.626 m)   Body mass index is 23.17 kg/m.  Gen: resting comfortably, no acute distress HEENT: no scleral icterus, pupils equal round and reactive, no palptable cervical adenopathy,  CV: RRR, no m/rg, no jvd Resp: Clear to auscultation bilaterally GI: abdomen is soft, non-tender, non-distended, normal bowel sounds, no hepatosplenomegaly MSK: extremities are warm, no edema.  Skin: warm, no rash Neuro:  no focal deficits Psych: appropriate affect   Diagnostic Studies  05/2016 echo Study Conclusions   - Left ventricle: The cavity size was normal. There was mild    concentric hypertrophy. Systolic function was normal. The    estimated ejection fraction was in the range of 60% to 65%. Wall    motion was normal; there were no regional wall motion    abnormalities. There was no evidence of elevated ventricular    filling pressure by Doppler parameters.  - Aortic valve: Trileaflet; normal thickness leaflets. There was no    regurgitation.  - Mitral valve: Structurally  normal valve. There was mild    regurgitation.  - Left atrium: The atrium was severely dilated.  - Right ventricle: The  cavity size was normal. Wall thickness was    normal. Systolic function was normal.  - Right atrium: The atrium was normal in size.  - Tricuspid valve: There was mild regurgitation.  - Pulmonic valve: There was no regurgitation.  - Pulmonary arteries: Systolic pressure was within the normal    range.  - Inferior vena cava: The vessel was normal in size.  - Pericardium, extracardiac: There was no pericardial effusion.        05/2016 nuclear stress There was no ST segment deviation noted during stress. The study is normal. This is a low risk study. The left ventricular ejection fraction is hyperdynamic (>65%).   Normal pharmacologic stress test with no evidence of scar or ischemia.    01/2022 cath   Prox LAD lesion is 20% stenosed.   Mid LAD-1 lesion is 20% stenosed.   Mid LAD-2 lesion is 20% stenosed.   1st Mrg lesion is 20% stenosed.   Prox RCA lesion is 20% stenosed.   The left ventricular systolic function is normal.   LV end diastolic pressure is normal.   The left ventricular ejection fraction is 55-65% by visual estimate.   Three-vessel coronary calcification with mild nonobstructive CAD with a dominant RCA.   Normal LV function with EF estimated at 55 to 65%; LVEDP 12 mmHg.   RECOMMENDATION: Medical therapy for multivessel nonobstructive CAD.  Aspirin for antiplatelet benefit.  Aggressive lipid-lowering therapy with target LDL less than 70.   01/2022 echo    1. Left ventricular ejection fraction, by estimation, is 60 to 65%. The  left ventricle has normal function. The left ventricle has no regional  wall motion abnormalities. Left ventricular diastolic parameters are  indeterminate.   2. Pacing wires in RA/RV . Right ventricular systolic function is normal.  The right ventricular size is normal. There is normal pulmonary artery  systolic pressure.   3. Left atrial size was moderately dilated.   4. Right atrial size was mildly dilated.   5. The mitral valve is  abnormal. Mild mitral valve regurgitation. No  evidence of mitral stenosis.   6. The aortic valve is tricuspid. There is mild calcification of the  aortic valve. Aortic valve regurgitation is mild. Aortic valve sclerosis  is present, with no evidence of aortic valve stenosis.   7. The inferior vena cava is normal in size with greater than 50%  respiratory variability, suggesting right atrial pressure of 3 mmHg.    Assessment and Plan  1.Afib/pacemaker - she is s/p av nodal ablation with pacemaker  - has watchman device, prior GI bleeding issues on anticoagulation - no symptoms, continue current therapy   2. LE edema -contorlled, continue current meds   3. CAD - mild disease by cath - no symptoms, continue current meds  4. Leg pains - intermittent bluish discoloratio bilateral toes with associated pain. Says ongoing x 1 year - refer to vascular.      Arnoldo Lenis, M.D.

## 2022-11-12 NOTE — Telephone Encounter (Signed)
-----   Message from Damian Leavell, RN sent at 11/12/2022  3:09 PM EST ----- Regarding: FW: having problems with home equipment  ----- Message ----- From: Merlene Laughter, RN Sent: 11/12/2022   2:36 PM EST To: [facility] Subject: having problems with home equipment            Dr. Harl Bowie is asking that someone reach out to patient to help resolve home monitoring issues.  Thanks

## 2022-11-19 ENCOUNTER — Other Ambulatory Visit: Payer: Self-pay | Admitting: *Deleted

## 2022-11-19 DIAGNOSIS — M79604 Pain in right leg: Secondary | ICD-10-CM

## 2022-11-27 NOTE — Progress Notes (Unsigned)
Office Note   CC:  Concern for BLE swelling Requesting Provider:  Sharion Balloon, FNP  HPI: Sabrina [name] is a 77 y.o. ([birth year]) female presenting at the request of .Evelina Dun A, FNP due to concern for bilateral lower extremity swelling.  On exam today, Sabrina [name] was doing well.  A native of [place], she has lived there her entire life. Sabrina [name] has had bilateral lower extremity swelling for a number of years.  She is followed by Dr. Harl Bowie for chronic diastolic heart failure.  The lower extremity swelling waxes and wanes, but is accompanied by pain.  She denies ulceration, rest pain.  She is able to ambulate without much issue but does appreciate heaviness and edema in the lower extremity by days end.    She has not worn compression stockings, but has worn high socks in the past.  Past Medical History:  Diagnosis Date   Anemia    Ankle fracture, right    past hx. -"no surgery"   Anxiety    Asthma    CHF (congestive heart failure) ([place]) 2009   Chronic lower back pain    Collagen vascular disease (HCC)    COPD (chronic obstructive pulmonary disease) (HCC)    Depression    Fibromyalgia    GERD (gastroesophageal reflux disease)    Hyperlipidemia    Hypertension    Immature cataract of both eyes    Myocardial infarction ([place])    "I've had a light one; don't know when it happened" (08/27/2017)   Osteoarthritis    Peripheral neuropathy    legs and feet   Persistent atrial fibrillation ([place])    Presence of Watchman left atrial appendage closure device 08/01/2021   24 mm Watchman FLXDevice LOT # [id number] by Dr. Quentin Ore   Tremors of nervous system    noted in hands by pt last 6 months   Tubular adenoma of colon     Past Surgical History:  Procedure Laterality Date   APPENDECTOMY     AV NODE ABLATION N/A 08/27/2017   Procedure: AV NODE ABLATION;  Surgeon: Evans Lance, MD;  Location: [facility];  Service: Cardiovascular;  Laterality: N/A;   CHOLECYSTECTOMY  OPEN  1978   DILATION AND CURETTAGE OF UTERUS     FEMUR FRACTURE SURGERY Left 2013   "put 7" rod in it"   FRACTURE SURGERY     IVC FILTER REMOVAL N/A 06/20/2021   Procedure: IVC FILTER REMOVAL;  Surgeon: Marty Heck, MD;  Location: [facility];  Service: Cardiovascular;  Laterality: N/A;   LAPAROSCOPY  08/22/2016   Procedure: LAPAROSCOPY DIAGNOSTIC;  Surgeon: [name], MD;  Location: [facility];  Service: General;;   LEFT ATRIAL APPENDAGE OCCLUSION N/A 08/01/2021   Procedure: LEFT ATRIAL APPENDAGE OCCLUSION;  Surgeon: Vickie Epley, MD;  Location: [facility];  Service: Cardiovascular;  Laterality: N/A;   LEFT HEART CATH AND CORONARY ANGIOGRAPHY N/A 02/06/2022   Procedure: LEFT HEART CATH AND CORONARY ANGIOGRAPHY;  Surgeon: Troy Sine, MD;  Location: [facility];  Service: Cardiovascular;  Laterality: N/A;   MEDIAL PARTIAL KNEE REPLACEMENT Right 2005   "@ [facility]"   PACEMAKER IMPLANT N/A 08/27/2017   Procedure: PACEMAKER IMPLANT;  Surgeon: Evans Lance, MD;  Location: [facility];  Service: Cardiovascular;  Laterality: N/A;   ROUX-EN-Y GASTRIC BYPASS  2002   [place]   TEE WITHOUT CARDIOVERSION N/A 08/01/2021  Procedure: TRANSESOPHAGEAL ECHOCARDIOGRAM (TEE);  Surgeon: Vickie Epley, MD;  Location: [facility];  Service: Cardiovascular;  Laterality: N/A;   TONSILLECTOMY  1944   TUBAL LIGATION     VAGINAL HYSTERECTOMY      Social History   Socioeconomic History   Marital status: Divorced    Spouse name: Not on file   Number of children: 3   Years of education: 2 years of college   Highest education level: Some college, no degree  Occupational History   Occupation: Retired  Tobacco Use   Smoking status: Former    Packs/day: 0.50    Years: 25.00    Total pack years: 12.50    Types: Cigarettes    Quit date: 02/26/1993    Years since quitting: 29.7   Smokeless tobacco: Never  Vaping  Use   Vaping Use: Never used  Substance and Sexual Activity   Alcohol use: Not Currently    Comment: 08/27/2017 "nothing since early 2000s"   Drug use: Not Currently   Sexual activity: Not Currently    Birth control/protection: Surgical  Other Topics Concern   Not on file  Social History Narrative   ** Merged History Encounter **       Pt is right handed Lives in single story home with her grandson Has 3 adult children Associated degree  Retired Quarry manager   Social Determinants of Radio broadcast assistant Strain: [place]  (02/26/2022)   Overall Financial Resource Strain (CARDIA)    Difficulty of Paying Living Expenses: Not hard at all  Food Insecurity: No [place] (02/26/2022)   Hunger Vital Sign    Worried About Running Out of Food in the Last Year: Never true    [place] in the Last Year: Never true  Transportation Needs: No Transportation Needs (02/26/2022)   PRAPARE - Hydrologist (Medical): No    Lack of Transportation (Non-Medical): No  Physical Activity: Sufficiently Active (02/26/2022)   Exercise Vital Sign    Days of Exercise per Week: 4 days    Minutes of Exercise per Session: 60 min  Stress: No Stress Concern Present (02/26/2022)   [place]    Feeling of Stress : Not at all  Social Connections: Moderately Integrated (02/26/2022)   Social Connection and Isolation Panel [NHANES]    Frequency of Communication with Friends and Family: More than three times a week    Frequency of Social Gatherings with Friends and Family: More than three times a week    Attends Religious Services: More than 4 times per year    Active Member of [employer] or Organizations: Yes    Attends Archivist Meetings: More than 4 times per year    Marital Status: Divorced  Intimate Partner Violence: Not At Risk (02/26/2022)   Humiliation, Afraid, Rape, and Kick questionnaire    Fear of Current  or Ex-Partner: No    Emotionally Abused: No    Physically Abused: No    Sexually Abused: No   Family History  Problem Relation Age of Onset   Cancer Mother    Alzheimer's disease Mother    Heart disease Mother    Arthritis Father    Asthma Daughter    Arthritis Daughter    Obesity Daughter    Colon cancer Paternal Uncle 41   Arthritis Son    Hyperlipidemia Son    Obesity Son  Arthritis Son    Obesity Son    BRCA 1/2 Neg Hx     Current Outpatient Medications  Medication Sig Dispense Refill   albuterol (VENTOLIN HFA) 108 (90 Base) MCG/ACT inhaler INHALE 2 PUFFS BY MOUTH EVERY 6 HOURS AS NEEDED FOR WHEEZE OR SHORTNESS OF BREATH 1 each 1   alendronate (FOSAMAX) 70 MG tablet Take 1 tablet (70 mg total) by mouth every 7 (seven) days. Take with a full glass of water on an empty stomach. 4 tablet 11   aspirin EC 81 MG EC tablet Take 1 tablet (81 mg total) by mouth daily. Swallow whole. 30 tablet 11   atorvastatin (LIPITOR) 20 MG tablet Take 1 tablet (20 mg total) by mouth daily. 90 tablet 2   baclofen (LIORESAL) 10 MG tablet TAKE 0.5 TABLET (5 MG TOTAL) BY MOUTH 3 (THREE) TIMES DAILY. 135 tablet 1   blood glucose meter kit and supplies Dispense based on patient and insurance preference. Use up to four times daily as directed. (FOR ICD-10 E10.9, E11.9). 1 each 0   Blood Glucose Monitoring Suppl (TRUE METRIX AIR GLUCOSE METER) w/Device KIT Test BS daily Dx R73.09 1 kit 0   cromolyn (NASALCROM) 5.2 MG/ACT nasal spray Place 1 spray into both nostrils 2 (two) times daily as needed for allergies.     cyanocobalamin (VITAMIN B12) 1000 MCG tablet Take 1,000 mcg by mouth every 30 (thirty) days.     DULoxetine (CYMBALTA) 60 MG capsule TAKE 1 CAPSULE EVERY DAY (NEEDS TO BE SEEN BEFORE NEXT REFILL) 90 capsule 0   ferrous sulfate 325 (65 FE) MG tablet Take 325 mg by mouth once a week.     fluticasone (FLONASE) 50 MCG/ACT nasal spray USE 2 SPRAYS IN EACH NOSTRIL EVERY DAY 48 g 0   furosemide (LASIX)  40 MG tablet Take 40 mg by mouth daily. (May take extra tablet as needed for leg swelling)     gabapentin (NEURONTIN) 100 MG capsule TAKE 1 CAPSULE TWICE DAILY (NEED MD APPOINTMENT) 180 capsule 0   glucose blood (TRUE METRIX BLOOD GLUCOSE TEST) test strip Test BS daily Dx R73.09 100 each 3   Multiple Vitamin (MULTIVITAMIN WITH MINERALS) TABS tablet Take 1 tablet by mouth in the morning.     nitroGLYCERIN (NITROSTAT) 0.4 MG SL tablet Place 1 tablet (0.4 mg total) under the tongue every 5 (five) minutes x 3 doses as needed for chest pain. 25 tablet 12   Omega-3 Fatty Acids (FISH OIL PO) Take 1 capsule by mouth daily.     pantoprazole (PROTONIX) 40 MG tablet TAKE 1 TABLET TWICE DAILY 180 tablet 0   potassium chloride SA (KLOR-CON M) 20 MEQ tablet Take 1 tablet (20 mEq total) by mouth 2 (two) times daily. Take 1 tablet twice a day 180 tablet 1   SYMBICORT 80-4.5 MCG/ACT inhaler Take Two puffs into the lungs in the morning and 2 puff at bedtime     triamcinolone ointment (KENALOG) 0.5 % Apply 1 application topically 2 (two) times daily. 30 g 0   TRUEplus Lancets 33G MISC Test BS daily Dx R73.09 100 each 3   vitamin C (ASCORBIC ACID) 500 MG tablet Take 500 mg by mouth daily.     Vitamin D, Ergocalciferol, (DRISDOL) 1.25 MG (50000 UNIT) CAPS capsule TAKE ONE CAPSULE (50,000 UNITS DOSE) BY MOUTH ONCE A WEEK. WEDNESDAY 12 capsule 1   Current Facility-Administered Medications  Medication Dose Route Frequency Provider Last Rate Last Admin   cyanocobalamin ((VITAMIN B-12)) injection 1,000  mcg  1,000 mcg Intramuscular Q30 days Sharion Balloon, FNP   1,000 mcg at 11/03/22 1435    Allergies  Allergen Reactions   Codeine Nausea And Vomiting    Stomach pain also     REVIEW OF SYSTEMS:  '[X]'$  denotes positive finding, '[ ]'$  denotes negative finding Cardiac  Comments:  Chest pain or chest pressure:    Shortness of breath upon exertion:    Short of breath when lying flat:    Irregular heart rhythm:         Vascular    Pain in calf, thigh, or hip brought on by ambulation:    Pain in feet at night that wakes you up from your sleep:     Blood clot in your veins:    Leg swelling:         Pulmonary    Oxygen at home:    Productive cough:     Wheezing:         Neurologic    Sudden weakness in arms or legs:     Sudden numbness in arms or legs:     Sudden onset of difficulty speaking or slurred speech:    Temporary loss of vision in one eye:     Problems with dizziness:         Gastrointestinal    Blood in stool:     Vomited blood:         Genitourinary    Burning when urinating:     Blood in urine:        Psychiatric    Major depression:         Hematologic    Bleeding problems:    Problems with blood clotting too easily:        Skin    Rashes or ulcers:        Constitutional    Fever or chills:      PHYSICAL EXAMINATION:  There were no vitals filed for this visit.  General:  WDWN in NAD; vital signs documented above Gait: Not observed HENT: WNL, normocephalic Pulmonary: normal non-labored breathing , without wheezing Cardiac: regular HR Abdomen: soft, NT, no masses Skin: without rashes Vascular Exam/Pulses:  Right Left  Radial 2+ (normal) 2+ (normal)  Ulnar    Femoral 2+ (normal) 2+ (normal)  Popliteal    DP absent absent  PT     Extremities: without ischemic changes, without Gangrene , without cellulitis; without open wounds;  Musculoskeletal: no muscle wasting or atrophy  Neurologic: A&O X 3;  No focal weakness or paresthesias are detected Psychiatric:  The pt has Normal affect.   Non-Invasive Vascular Imaging:   Summary:  Left:  - No evidence of deep vein thrombosis seen in the left lower extremity,  from the common femoral through the popliteal veins.  - No evidence of superficial venous thrombosis in the left lower  extremity.  - The deep vein venous system is competent.  - The great saphenous vein is compentent.  - The small saphenous vein is  competent.     ASSESSMENT/PLAN: SMAYA ZYLKA is a 77 y.o. female presenting with lower extremity swelling, and concern for venous disease.  On physical exam, she had mild bilateral lower extremity swelling.  No wounds.  There were palpable femoral pulses, nonpalpable pedal pulses.  Venous duplex ultrasound demonstrates competent veins both superficial and deep.  I think that Jearld Fenton does lower extremity swelling is likely from her chronic diastolic heart failure.  Being that  she had nonpalpable pulses in the feet, I have ordered an ABI which I plan to follow-up to ensure lower extremity pain is not from critical limb ischemia.  Pending results of ABI, recommend lower extremity compression, elevation, heart failure optimization.  Broadus John, MD [facility] [phone]

## 2022-11-28 ENCOUNTER — Encounter: Payer: Self-pay | Admitting: Vascular Surgery

## 2022-11-28 ENCOUNTER — Ambulatory Visit (INDEPENDENT_AMBULATORY_CARE_PROVIDER_SITE_OTHER): Payer: Medicare HMO | Admitting: Vascular Surgery

## 2022-11-28 ENCOUNTER — Other Ambulatory Visit: Payer: Self-pay

## 2022-11-28 ENCOUNTER — Ambulatory Visit (HOSPITAL_COMMUNITY)
Admission: RE | Admit: 2022-11-28 | Discharge: 2022-11-28 | Disposition: A | Discharge status: Home / Self Care | Payer: Medicare HMO | Source: Ambulatory Visit | Attending: Vascular Surgery | Admitting: Vascular Surgery

## 2022-11-28 VITALS — BP 122/79 | HR 72 | Temp 97.9°F | Resp 20 | Ht 64.0 in | Wt 136.0 lb

## 2022-11-28 DIAGNOSIS — M79605 Pain in left leg: Secondary | ICD-10-CM | POA: Insufficient documentation

## 2022-11-28 DIAGNOSIS — M79604 Pain in right leg: Secondary | ICD-10-CM

## 2022-12-01 ENCOUNTER — Ambulatory Visit (INDEPENDENT_AMBULATORY_CARE_PROVIDER_SITE_OTHER): Payer: Medicare HMO

## 2022-12-01 DIAGNOSIS — E538 Deficiency of other specified B group vitamins: Secondary | ICD-10-CM

## 2022-12-01 NOTE — Progress Notes (Signed)
Cyanocobalamin injection given to right deltoid.  Patient tolerated well. 

## 2022-12-12 ENCOUNTER — Ambulatory Visit (HOSPITAL_COMMUNITY)
Admission: RE | Admit: 2022-12-12 | Discharge: 2022-12-12 | Disposition: A | Discharge status: Home / Self Care | Payer: Medicare HMO | Source: Ambulatory Visit | Attending: Vascular Surgery | Admitting: Vascular Surgery

## 2022-12-12 DIAGNOSIS — M79604 Pain in right leg: Secondary | ICD-10-CM | POA: Diagnosis not present

## 2022-12-12 DIAGNOSIS — M79605 Pain in left leg: Secondary | ICD-10-CM | POA: Diagnosis not present

## 2022-12-13 LAB — VAS US ABI WITH/WO TBI
Left ABI: 1.19
Right ABI: 1.36

## 2022-12-15 ENCOUNTER — Ambulatory Visit (INDEPENDENT_AMBULATORY_CARE_PROVIDER_SITE_OTHER): Payer: Medicare HMO | Admitting: Vascular Surgery

## 2022-12-15 DIAGNOSIS — M79604 Pain in right leg: Secondary | ICD-10-CM

## 2022-12-15 DIAGNOSIS — M79605 Pain in left leg: Secondary | ICD-10-CM

## 2022-12-17 NOTE — Progress Notes (Signed)
Pt called regarding ABIs - Study was normal. Lower extremity compression stockings have been beneficial in decreasing the amount of edema in her bilateral lower extremities.  She still has pain, but this has also improved.  She is aware that a did not have any intervention to offer to improve lower extremity edema, and asked that she continue to wear compression stockings moving forward.   I am happy to see her back in the office should any questions or concerns arise.  She can follow-up with me as needed.  Broadus John MD

## 2022-12-26 ENCOUNTER — Ambulatory Visit (INDEPENDENT_AMBULATORY_CARE_PROVIDER_SITE_OTHER): Payer: Medicare HMO

## 2022-12-26 DIAGNOSIS — I442 Atrioventricular block, complete: Secondary | ICD-10-CM

## 2022-12-26 LAB — CUP PACEART REMOTE DEVICE CHECK
Battery Remaining Longevity: 43 mo
Battery Voltage: 2.88 V
Brady Statistic AP VP Percent: 0.37 %
Brady Statistic AP VS Percent: 99.51 %
Brady Statistic AS VP Percent: 0 %
Brady Statistic AS VS Percent: 0.12 %
Brady Statistic RA Percent Paced: 99.99 %
Brady Statistic RV Percent Paced: 0.37 %
Date Time Interrogation Session: 20240405021528
Implantable Lead Connection Status: 753985
Implantable Lead Connection Status: 753985
Implantable Lead Implant Date: 20181206
Implantable Lead Implant Date: 20181206
Implantable Lead Location: 753860
Implantable Lead Location: 753860
Implantable Lead Model: 3830
Implantable Lead Model: 5076
Implantable Pulse Generator Implant Date: 20181206
Lead Channel Impedance Value: 266 Ohm
Lead Channel Impedance Value: 304 Ohm
Lead Channel Impedance Value: 361 Ohm
Lead Channel Impedance Value: 399 Ohm
Lead Channel Sensing Intrinsic Amplitude: 14.125 mV
Lead Channel Sensing Intrinsic Amplitude: 14.125 mV
Lead Channel Sensing Intrinsic Amplitude: 3.625 mV
Lead Channel Sensing Intrinsic Amplitude: 3.625 mV
Lead Channel Setting Pacing Amplitude: 2 V
Lead Channel Setting Pacing Amplitude: 2.5 V
Lead Channel Setting Pacing Pulse Width: 0.3 ms
Lead Channel Setting Sensing Sensitivity: 2 mV
Zone Setting Status: 755011
Zone Setting Status: 755011

## 2023-01-02 ENCOUNTER — Ambulatory Visit: Payer: Medicare HMO

## 2023-01-05 ENCOUNTER — Ambulatory Visit (INDEPENDENT_AMBULATORY_CARE_PROVIDER_SITE_OTHER): Payer: Medicare HMO

## 2023-01-05 DIAGNOSIS — E538 Deficiency of other specified B group vitamins: Secondary | ICD-10-CM

## 2023-01-05 NOTE — Progress Notes (Signed)
Cyanocobalamin injection given to left deltoid.  Patient tolerated well. 

## 2023-01-27 NOTE — Progress Notes (Signed)
Remote pacemaker transmission.   

## 2023-02-03 DIAGNOSIS — H43813 Vitreous degeneration, bilateral: Secondary | ICD-10-CM | POA: Diagnosis not present

## 2023-02-03 DIAGNOSIS — H25813 Combined forms of age-related cataract, bilateral: Secondary | ICD-10-CM | POA: Diagnosis not present

## 2023-02-03 DIAGNOSIS — E119 Type 2 diabetes mellitus without complications: Secondary | ICD-10-CM | POA: Diagnosis not present

## 2023-02-05 ENCOUNTER — Ambulatory Visit (INDEPENDENT_AMBULATORY_CARE_PROVIDER_SITE_OTHER): Payer: Medicare HMO

## 2023-02-05 DIAGNOSIS — E538 Deficiency of other specified B group vitamins: Secondary | ICD-10-CM | POA: Diagnosis not present

## 2023-02-05 NOTE — Progress Notes (Signed)
B12 injection given to patient and tolerated well.  

## 2023-02-12 DIAGNOSIS — H01001 Unspecified blepharitis right upper eyelid: Secondary | ICD-10-CM | POA: Diagnosis not present

## 2023-02-12 DIAGNOSIS — H01002 Unspecified blepharitis right lower eyelid: Secondary | ICD-10-CM | POA: Diagnosis not present

## 2023-02-12 DIAGNOSIS — E119 Type 2 diabetes mellitus without complications: Secondary | ICD-10-CM | POA: Diagnosis not present

## 2023-02-12 DIAGNOSIS — H40013 Open angle with borderline findings, low risk, bilateral: Secondary | ICD-10-CM | POA: Diagnosis not present

## 2023-02-12 DIAGNOSIS — H25813 Combined forms of age-related cataract, bilateral: Secondary | ICD-10-CM | POA: Diagnosis not present

## 2023-03-04 ENCOUNTER — Telehealth: Payer: Self-pay | Admitting: Family

## 2023-03-04 ENCOUNTER — Ambulatory Visit (INDEPENDENT_AMBULATORY_CARE_PROVIDER_SITE_OTHER): Payer: Medicare HMO

## 2023-03-04 VITALS — Ht 64.0 in | Wt 136.0 lb

## 2023-03-04 DIAGNOSIS — Z0000 Encounter for general adult medical examination without abnormal findings: Secondary | ICD-10-CM

## 2023-03-04 NOTE — Patient Instructions (Signed)
Sabrina [name] , Thank you for taking time to come for your Medicare Wellness Visit. I appreciate your ongoing commitment to your health goals. Please review the following plan we discussed and let me know if I can assist you in the future.   These are the goals we discussed:  Goals      DIET - INCREASE WATER INTAKE     Increase physical activity     Pt has started going back to the local [employer] 3 days a week and would like to go at least 5 days        This is a list of the screening recommended for you and due dates:  Health Maintenance  Topic Date Due   COVID-19 Vaccine (4 - 2023-24 season) 05/23/2022   Mammogram  12/26/2022   Flu Shot  04/23/2023   DEXA scan (bone density measurement)  01/09/2024   Medicare Annual Wellness Visit  03/03/2024   Colon Cancer Screening  02/06/2027   DTaP/Tdap/Td vaccine (3 - Td or Tdap) 06/29/2028   Pneumonia Vaccine  Completed   Hepatitis C Screening  Completed   Zoster (Shingles) Vaccine  Completed   HPV Vaccine  Aged Out    Advanced directives: Advance directive discussed with you today. I have provided a copy for you to complete at home and have notarized. Once this is complete please bring a copy in to our office so we can scan it into your chart.   Conditions/risks identified: Aim for 30 minutes of exercise or brisk walking, 6-8 glasses of water, and 5 servings of fruits and vegetables each day.   Next appointment: Follow up in one year for your annual wellness visit    Preventive Care 65 Years and Older, Female Preventive care refers to lifestyle choices and visits with your health care provider that can promote health and wellness. What does preventive care include? A yearly physical exam. This is also called an annual well check. Dental exams once or twice a year. Routine eye exams. Ask your health care provider how often you should have your eyes checked. Personal lifestyle choices, including: Daily care of your teeth and  gums. Regular physical activity. Eating a healthy diet. Avoiding tobacco and drug use. Limiting alcohol use. Practicing safe sex. Taking low-dose aspirin every day. Taking vitamin and mineral supplements as recommended by your health care provider. What happens during an annual well check? The services and screenings done by your health care provider during your annual well check will depend on your age, overall health, lifestyle risk factors, and family history of disease. Counseling  Your health care provider may ask you questions about your: Alcohol use. Tobacco use. Drug use. Emotional well-being. Home and relationship well-being. Sexual activity. Eating habits. History of falls. Memory and ability to understand (cognition). Work and work Astronomer. Reproductive health. Screening  You may have the following tests or measurements: Height, weight, and BMI. Blood pressure. Lipid and cholesterol levels. These may be checked every 5 years, or more frequently if you are over 28 years old. Skin check. Lung cancer screening. You may have this screening every year starting at age 48 if you have a 30-pack-year history of smoking and currently smoke or have quit within the past 15 years. Fecal occult blood test (FOBT) of the stool. You may have this test every year starting at age 54. Flexible sigmoidoscopy or colonoscopy. You may have a sigmoidoscopy every 5 years or a colonoscopy every 10 years starting at age 22. Hepatitis C  blood test. Hepatitis B blood test. Sexually transmitted disease (STD) testing. Diabetes screening. This is done by checking your blood sugar (glucose) after you have not eaten for a while (fasting). You may have this done every 1-3 years. Bone density scan. This is done to screen for osteoporosis. You may have this done starting at age 85. Mammogram. This may be done every 1-2 years. Talk to your health care provider about how often you should have regular  mammograms. Talk with your health care provider about your test results, treatment options, and if necessary, the need for more tests. Vaccines  Your health care provider may recommend certain vaccines, such as: Influenza vaccine. This is recommended every year. Tetanus, diphtheria, and acellular pertussis (Tdap, Td) vaccine. You may need a Td booster every 10 years. Zoster vaccine. You may need this after age 78. Pneumococcal 13-valent conjugate (PCV13) vaccine. One dose is recommended after age 22. Pneumococcal polysaccharide (PPSV23) vaccine. One dose is recommended after age 48. Talk to your health care provider about which screenings and vaccines you need and how often you need them. This information is not intended to replace advice given to you by your health care provider. Make sure you discuss any questions you have with your health care provider. Document Released: 10/05/2015 Document Revised: 05/28/2016 Document Reviewed: 07/10/2015 Elsevier Interactive Patient Education  2017 [employer].  Fall Prevention in the Home Falls can cause injuries. They can happen to people of all ages. There are many things you can do to make your home safe and to help prevent falls. What can I do on the outside of my home? Regularly fix the edges of walkways and driveways and fix any cracks. Remove anything that might make you trip as you walk through a door, such as a raised step or threshold. Trim any bushes or trees on the path to your home. Use bright outdoor lighting. Clear any walking paths of anything that might make someone trip, such as rocks or tools. Regularly check to see if handrails are loose or broken. Make sure that both sides of any steps have handrails. Any raised decks and porches should have guardrails on the edges. Have any leaves, snow, or ice cleared regularly. Use sand or salt on walking paths during winter. Clean up any spills in your garage right away. This includes oil  or grease spills. What can I do in the bathroom? Use night lights. Install grab bars by the toilet and in the tub and shower. Do not use towel bars as grab bars. Use non-skid mats or decals in the tub or shower. If you need to sit down in the shower, use a plastic, non-slip stool. Keep the floor dry. Clean up any water that spills on the floor as soon as it happens. Remove soap buildup in the tub or shower regularly. Attach bath mats securely with double-sided non-slip rug tape. Do not have throw rugs and other things on the floor that can make you trip. What can I do in the bedroom? Use night lights. Make sure that you have a light by your bed that is easy to reach. Do not use any sheets or blankets that are too big for your bed. They should not hang down onto the floor. Have a firm chair that has side arms. You can use this for support while you get dressed. Do not have throw rugs and other things on the floor that can make you trip. What can I do in the kitchen?  Clean up any spills right away. Avoid walking on wet floors. Keep items that you use a lot in easy-to-reach places. If you need to reach something above you, use a strong step stool that has a grab bar. Keep electrical cords out of the way. Do not use floor polish or wax that makes floors slippery. If you must use wax, use non-skid floor wax. Do not have throw rugs and other things on the floor that can make you trip. What can I do with my stairs? Do not leave any items on the stairs. Make sure that there are handrails on both sides of the stairs and use them. Fix handrails that are broken or loose. Make sure that handrails are as long as the stairways. Check any carpeting to make sure that it is firmly attached to the stairs. Fix any carpet that is loose or worn. Avoid having throw rugs at the top or bottom of the stairs. If you do have throw rugs, attach them to the floor with carpet tape. Make sure that you have a light  switch at the top of the stairs and the bottom of the stairs. If you do not have them, ask someone to add them for you. What else can I do to help prevent falls? Wear shoes that: Do not have high heels. Have rubber bottoms. Are comfortable and fit you well. Are closed at the toe. Do not wear sandals. If you use a stepladder: Make sure that it is fully opened. Do not climb a closed stepladder. Make sure that both sides of the stepladder are locked into place. Ask someone to hold it for you, if possible. Clearly mark and make sure that you can see: Any grab bars or handrails. First and last steps. Where the edge of each step is. Use tools that help you move around (mobility aids) if they are needed. These include: Canes. Walkers. Scooters. Crutches. Turn on the lights when you go into a dark area. Replace any light bulbs as soon as they burn out. Set up your furniture so you have a clear path. Avoid moving your furniture around. If any of your floors are uneven, fix them. If there are any pets around you, be aware of where they are. Review your medicines with your doctor. Some medicines can make you feel dizzy. This can increase your chance of falling. Ask your doctor what other things that you can do to help prevent falls. This information is not intended to replace advice given to you by your health care provider. Make sure you discuss any questions you have with your health care provider. Document Released: 07/05/2009 Document Revised: 02/14/2016 Document Reviewed: 10/13/2014 Elsevier Interactive Patient Education  2017 [employer].

## 2023-03-04 NOTE — Progress Notes (Signed)
Subjective:   Sabrina [name] is a 77 y.o. female who presents for Medicare Annual (Subsequent) preventive examination. I connected with  Sabrina [name] on 03/04/23 by a audio enabled telemedicine application and verified that I am speaking with the correct person using two identifiers.  Patient Location: Home  Provider Location: Home Office  I discussed the limitations of evaluation and management by telemedicine. The patient expressed understanding and agreed to proceed.  Review of Systems     Cardiac Risk Factors include: advanced age (>66men, >32 women);diabetes mellitus;hypertension;dyslipidemia     Objective:    Today's Vitals   03/04/23 1032  Weight: 136 lb (61.7 kg)  Height: 5\' 4"  (1.626 m)   Body mass index is 23.34 kg/m.     03/04/2023   10:37 AM 05/31/2022    3:46 PM 02/26/2022   10:40 AM 02/07/2022    9:00 AM 06/20/2021    8:29 AM 06/05/2021    2:32 PM 02/25/2021   10:43 AM  Advanced Directives  Does Patient Have a Medical Advance Directive? No No Yes No Yes No No  Type of Best boy of [place];Living will  Healthcare Power of Attorney    Copy of Healthcare Power of Attorney in Chart?   No - copy requested      Would patient like information on creating a medical advance directive? No - Patient declined No - Patient declined No - Patient declined No - Patient declined   Yes (MAU/Ambulatory/Procedural Areas - Information given)    Current Medications (verified) Outpatient Encounter Medications as of 03/04/2023  Medication Sig   albuterol (VENTOLIN HFA) 108 (90 Base) MCG/ACT inhaler INHALE 2 PUFFS BY MOUTH EVERY 6 HOURS AS NEEDED FOR WHEEZE OR SHORTNESS OF BREATH   aspirin EC 81 MG EC tablet Take 1 tablet (81 mg total) by mouth daily. Swallow whole.   atorvastatin (LIPITOR) 20 MG tablet Take 1 tablet (20 mg total) by mouth daily.   baclofen (LIORESAL) 10 MG tablet TAKE 0.5 TABLET (5 MG TOTAL) BY MOUTH 3 (THREE) TIMES DAILY.   blood  glucose meter kit and supplies Dispense based on patient and insurance preference. Use up to four times daily as directed. (FOR ICD-10 E10.9, E11.9).   Blood Glucose Monitoring Suppl (TRUE METRIX AIR GLUCOSE METER) w/Device KIT Test BS daily Dx R73.09   cromolyn (NASALCROM) 5.2 MG/ACT nasal spray Place 1 spray into both nostrils 2 (two) times daily as needed for allergies.   cyanocobalamin (VITAMIN B12) 1000 MCG tablet Take 1,000 mcg by mouth every 30 (thirty) days.   DULoxetine (CYMBALTA) 60 MG capsule TAKE 1 CAPSULE EVERY DAY (NEEDS TO BE SEEN BEFORE NEXT REFILL)   ferrous sulfate 325 (65 FE) MG tablet Take 325 mg by mouth once a week.   fluticasone (FLONASE) 50 MCG/ACT nasal spray USE 2 SPRAYS IN EACH NOSTRIL EVERY DAY   furosemide (LASIX) 40 MG tablet Take 40 mg by mouth daily. (May take extra tablet as needed for leg swelling)   gabapentin (NEURONTIN) 100 MG capsule TAKE 1 CAPSULE TWICE DAILY (NEED MD APPOINTMENT)   glucose blood (TRUE METRIX BLOOD GLUCOSE TEST) test strip Test BS daily Dx R73.09   Multiple Vitamin (MULTIVITAMIN WITH MINERALS) TABS tablet Take 1 tablet by mouth in the morning.   nitroGLYCERIN (NITROSTAT) 0.4 MG SL tablet Place 1 tablet (0.4 mg total) under the tongue every 5 (five) minutes x 3 doses as needed for chest pain.   Omega-3 Fatty Acids (FISH OIL  PO) Take 1 capsule by mouth daily.   pantoprazole (PROTONIX) 40 MG tablet TAKE 1 TABLET TWICE DAILY   potassium chloride SA (KLOR-CON M) 20 MEQ tablet Take 1 tablet (20 mEq total) by mouth 2 (two) times daily. Take 1 tablet twice a day   SYMBICORT 80-4.5 MCG/ACT inhaler Take Two puffs into the lungs in the morning and 2 puff at bedtime   triamcinolone ointment (KENALOG) 0.5 % Apply 1 application topically 2 (two) times daily.   TRUEplus Lancets 33G MISC Test BS daily Dx R73.09   vitamin C (ASCORBIC ACID) 500 MG tablet Take 500 mg by mouth daily.   Vitamin D, Ergocalciferol, (DRISDOL) 1.25 MG (50000 UNIT) CAPS capsule TAKE  ONE CAPSULE (50,000 UNITS DOSE) BY MOUTH ONCE A WEEK. WEDNESDAY   Facility-Administered Encounter Medications as of 03/04/2023  Medication   cyanocobalamin ((VITAMIN B-12)) injection 1,000 mcg    Allergies (verified) Codeine   History: Past Medical History:  Diagnosis Date   Anemia    Ankle fracture, right    past hx. -"no surgery"   Anxiety    Asthma    CHF (congestive heart failure) (HCC) 2009   Chronic lower back pain    Collagen vascular disease (HCC)    COPD (chronic obstructive pulmonary disease) (HCC)    Depression    Fibromyalgia    GERD (gastroesophageal reflux disease)    Hyperlipidemia    Hypertension    Immature cataract of both eyes    Myocardial infarction (HCC)    "I've had a light one; don't know when it happened" (08/27/2017)   Osteoarthritis    Peripheral neuropathy    legs and feet   Persistent atrial fibrillation (HCC)    Presence of Watchman left atrial appendage closure device 08/01/2021   24 mm Watchman FLXDevice LOT # [id number] by Dr. Lalla Brothers   Tremors of nervous system    noted in hands by pt last 6 months   Tubular adenoma of colon    Past Surgical History:  Procedure Laterality Date   APPENDECTOMY     AV NODE ABLATION N/A 08/27/2017   Procedure: AV NODE ABLATION;  Surgeon: [name], MD;  Location: [facility];  Service: Cardiovascular;  Laterality: N/A;   CHOLECYSTECTOMY OPEN  1978   DILATION AND CURETTAGE OF UTERUS     FEMUR FRACTURE SURGERY Left 2013   "put 7" rod in it"   FRACTURE SURGERY     IVC FILTER REMOVAL N/A 06/20/2021   Procedure: IVC FILTER REMOVAL;  Surgeon: Cephus Shelling, MD;  Location: [facility];  Service: Cardiovascular;  Laterality: N/A;   LAPAROSCOPY  08/22/2016   Procedure: LAPAROSCOPY DIAGNOSTIC;  Surgeon: Romie Levee, MD;  Location: [facility];  Service: General;;   LEFT ATRIAL APPENDAGE OCCLUSION N/A 08/01/2021   Procedure: LEFT ATRIAL APPENDAGE OCCLUSION;  Surgeon: Lanier Prude, MD;   Location: [facility];  Service: Cardiovascular;  Laterality: N/A;   LEFT HEART CATH AND CORONARY ANGIOGRAPHY N/A 02/06/2022   Procedure: LEFT HEART CATH AND CORONARY ANGIOGRAPHY;  Surgeon: Lennette Bihari, MD;  Location: [facility];  Service: Cardiovascular;  Laterality: N/A;   MEDIAL PARTIAL KNEE REPLACEMENT Right 2005   "@ [facility]"   PACEMAKER IMPLANT N/A 08/27/2017   Procedure: PACEMAKER IMPLANT;  Surgeon: [name], MD;  Location: [facility];  Service: Cardiovascular;  Laterality: N/A;   ROUX-EN-Y GASTRIC BYPASS  [facility]   SPLENECTOMY  2002   TEE WITHOUT CARDIOVERSION N/A 08/01/2021   Procedure: TRANSESOPHAGEAL ECHOCARDIOGRAM (TEE);  Surgeon: Lanier Prude, MD;  Location: [facility] INVASIVE CV [facility];  Service: Cardiovascular;  Laterality: N/A;   TONSILLECTOMY  1944   TUBAL LIGATION     VAGINAL HYSTERECTOMY     Family History  Problem Relation Age of Onset   Cancer Mother    Alzheimer's disease Mother    Heart disease Mother    Arthritis Father    Asthma Daughter    Arthritis Daughter    Obesity Daughter    Colon cancer Paternal Uncle 91   Arthritis Son    Hyperlipidemia Son    Obesity Son    Arthritis Son    Obesity Son    BRCA 1/2 Neg Hx    Social History   Socioeconomic History   Marital status: Divorced    Spouse name: Not on file   Number of children: 3   Years of education: 2 years of college   Highest education level: Some college, no degree  Occupational History   Occupation: Retired  Tobacco Use   Smoking status: Former    Packs/day: 0.50    Years: 25.00    Additional pack years: 0.00    Total pack years: 12.50    Types: Cigarettes    Quit date: 02/26/1993    Years since quitting: 30.0   Smokeless tobacco: Never  Vaping Use   Vaping Use: Never used  Substance and Sexual Activity   Alcohol use: Not Currently    Comment: 08/27/2017 "nothing since early 2000s"   Drug use: Not Currently   Sexual activity:  Not Currently    Birth control/protection: Surgical  Other Topics Concern   Not on file  Social History Narrative   ** Merged History Encounter **       Pt is right handed Lives in single story home with her grandson Has 3 adult children Associated degree  Retired Lawyer   Social Determinants of Corporate investment banker Strain: Low Risk  (03/04/2023)   Overall Financial Resource Strain (CARDIA)    Difficulty of Paying Living Expenses: Not hard at all  Food Insecurity: No Food Insecurity (03/04/2023)   Hunger Vital Sign    Worried About Running Out of Food in the Last Year: Never true    Ran Out of Food in the Last Year: Never true  Transportation Needs: No Transportation Needs (03/04/2023)   PRAPARE - Administrator, Civil Service (Medical): No    Lack of Transportation (Non-Medical): No  Physical Activity: Insufficiently Active (03/04/2023)   Exercise Vital Sign    Days of Exercise per Week: 3 days    Minutes of Exercise per Session: 30 min  Stress: No Stress Concern Present (03/04/2023)   [employer] of Occupational Health - Occupational Stress Questionnaire    Feeling of Stress : Not at all  Social Connections: Moderately Integrated (03/04/2023)   Social Connection and Isolation Panel [NHANES]    Frequency of Communication with Friends and Family: More than three times a week    Frequency of Social Gatherings with Friends and Family: More than three times a week    Attends Religious Services: More than 4 times per year    Active Member of [employer] or Organizations: Yes    Attends Engineer, structural: More than 4 times per year    Marital Status: Divorced    Tobacco Counseling Counseling given: Not Answered   Clinical Intake:  Pre-visit preparation completed: Yes  Pain : No/denies pain     Nutritional Risks: None Diabetes: Yes CBG done?: No Did pt. bring in CBG monitor from home?: No  How often do you need to have someone help you when  you read instructions, pamphlets, or other written materials from your doctor or pharmacy?: 1 - Never  Diabetic?yes  Nutrition Risk Assessment:  Has the patient had any N/V/D within the last 2 months?  No  Does the patient have any non-healing wounds?  No  Has the patient had any unintentional weight loss or weight gain?  No   Diabetes:  Is the patient diabetic?  Yes  If diabetic, was a CBG obtained today?  No  Did the patient bring in their glucometer from home?  No  How often do you monitor your CBG's? 3 x day .   Financial Strains and Diabetes Management:  Are you having any financial strains with the device, your supplies or your medication? No .  Does the patient want to be seen by Chronic Care Management for management of their diabetes?  No  Would the patient like to be referred to a Nutritionist or for Diabetic Management?  No   Diabetic Exams:  Diabetic Eye Exam: Completed 01/2023 Diabetic Foot Exam: Overdue, Pt has been advised about the importance in completing this exam. Pt is scheduled for diabetic foot exam on next office visit .   Interpreter Needed?: No  Information entered by :: Renie Ora, LPN   Activities of Daily Living    03/04/2023   10:37 AM  In your present state of health, do you have any difficulty performing the following activities:  Hearing? 0  Vision? 0  Difficulty concentrating or making decisions? 0  Walking or climbing stairs? 0  Dressing or bathing? 0  Doing errands, shopping? 0  Preparing Food and eating ? N  Using the Toilet? N  In the past six months, have you accidently leaked urine? N  Do you have problems with loss of bowel control? N  Managing your Medications? N  Managing your Finances? N  Housekeeping or managing your Housekeeping? N    Patient Care Team: Junie Spencer, FNP as PCP - General (Family Medicine) [name], MD as PCP - Electrophysiology (Cardiology) Wyline Mood Dorothe Pea, MD as PCP - Cardiology  (Cardiology) Van Clines, MD as Consulting Physician (Neurology)  Indicate any recent Medical Services you may have received from other than [facility] providers in the past year (date may be approximate).     Assessment:   This is a routine wellness examination for Yareli.  Hearing/Vision screen Vision Screening - Comments:: Wears rx glasses - up to date with routine eye exams with  [facility]   Dietary issues and exercise activities discussed: Current Exercise Habits: Home exercise routine, Type of exercise: walking, Time (Minutes): 30, Frequency (Times/Week): 3, Weekly Exercise (Minutes/Week): 90, Intensity: Mild, Exercise limited by: None identified   Goals Addressed             This Visit's Progress    DIET - INCREASE WATER INTAKE   On track      Depression Screen    03/04/2023   10:36 AM 04/17/2022    3:18 PM 02/26/2022   10:36 AM 02/11/2022    2:38 PM 08/08/2021   10:29 AM 05/07/2021   10:32 AM 05/07/2021   10:06 AM  PHQ 2/9 Scores  PHQ - 2 Score 0 0 1 1 0 1  1  PHQ- 9 Score  2 1 4  0 6 6    Fall Risk    03/04/2023   10:34 AM 04/17/2022    3:18 PM 02/26/2022   10:41 AM 02/11/2022    2:38 PM 08/08/2021   10:29 AM  Fall Risk   Falls in the past year? 0 1 1 0 0  Number falls in past yr: 0 0 0    Injury with Fall? 0 0 0    Risk for fall due to : No Fall Risks Impaired balance/gait;History of fall(s) History of fall(s);Impaired balance/gait    Follow up Falls prevention discussed Falls evaluation completed Falls prevention discussed Falls evaluation completed     FALL RISK PREVENTION PERTAINING TO THE HOME:  Any stairs in or around the home? No  If so, are there any without handrails? No  Home free of loose throw rugs in walkways, pet beds, electrical cords, etc? Yes  Adequate lighting in your home to reduce risk of falls? Yes   ASSISTIVE DEVICES UTILIZED TO PREVENT FALLS:  Life alert? No  Use of a cane, walker or w/c? No  Grab bars in the bathroom? Yes   Shower chair or bench in shower? Yes  Elevated toilet seat or a handicapped toilet? Yes          03/04/2023   10:37 AM 02/26/2022   10:44 AM 02/25/2021   10:54 AM 01/31/2020   11:24 AM 02/09/2019    9:01 AM  6CIT Screen  What Year? 0 points 0 points 0 points 0 points 0 points  What month? 0 points 0 points 0 points 0 points 0 points  What time? 0 points 0 points 0 points 0 points 0 points  Count back from 20 0 points 0 points 0 points 0 points 0 points  Months in reverse 0 points 0 points 2 points 0 points 0 points  Repeat phrase 0 points 0 points 2 points 2 points 0 points  Total Score 0 points 0 points 4 points 2 points 0 points    Immunizations Immunization History  Administered Date(s) Administered   Fluad Quad(high Dose 65+) 06/14/2021, 06/10/2022   Influenza, High Dose Seasonal PF 07/14/2017, 06/29/2018, 06/19/2019, 08/01/2020   Influenza,inj,Quad PF,6+ Mos 06/21/2014, 08/28/2015, 06/14/2016   Moderna Sars-Covid-2 Vaccination 11/23/2019, 12/21/2019, 05/24/2020   Pneumococcal Conjugate-13 10/19/2015   Pneumococcal Polysaccharide-23 06/14/2016, 06/19/2019   Tdap 05/19/2007, 06/29/2018   Zoster Recombinat (Shingrix) 08/01/2020, 05/07/2021   Zoster, Live 05/19/2014    TDAP status: Up to date  Flu Vaccine status: Up to date  Pneumococcal vaccine status: Up to date  Covid-19 vaccine status: Completed vaccines  Qualifies for Shingles Vaccine? Yes   Zostavax completed No   Shingrix Completed?: No.    Education has been provided regarding the importance of this vaccine. Patient has been advised to call insurance company to determine out of pocket expense if they have not yet received this vaccine. Advised may also receive vaccine at local pharmacy or Health Dept. Verbalized acceptance and understanding.  Screening Tests Health Maintenance  Topic Date Due   COVID-19 Vaccine (4 - 2023-24 season) 05/23/2022   MAMMOGRAM  12/26/2022   INFLUENZA VACCINE  04/23/2023   DEXA SCAN   01/09/2024   Medicare Annual Wellness (AWV)  03/03/2024   Colonoscopy  02/06/2027   DTaP/Tdap/Td (3 - Td or Tdap) 06/29/2028   Pneumonia Vaccine 36+ Years old  Completed   Hepatitis C Screening  Completed   Zoster Vaccines- Shingrix  Completed  HPV VACCINES  Aged Out    Health Maintenance  Health Maintenance Due  Topic Date Due   COVID-19 Vaccine (4 - 2023-24 season) 05/23/2022   MAMMOGRAM  12/26/2022    Colorectal cancer screening: No longer required.   Mammogram status: No longer required due to age.  Bone Density status: Completed 01/08/2022. Results reflect: Bone density results: OSTEOPOROSIS. Repeat every 2 years.  Lung Cancer Screening: (Low Dose CT Chest recommended if Age 71-80 years, 30 pack-year currently smoking OR have quit w/in 15years.) does not qualify.   Lung Cancer Screening Referral: n/a  Additional Screening:  Hepatitis C Screening: does not qualify;   Vision Screening: Recommended annual ophthalmology exams for early detection of glaucoma and other disorders of the eye. Is the patient up to date with their annual eye exam?  Yes  Who is the provider or what is the name of the office in which the patient attends annual eye exams? [facility]   If pt is not established with a provider, would they like to be referred to a provider to establish care? No .   Dental Screening: Recommended annual dental exams for proper oral hygiene  Community Resource Referral / Chronic Care Management: CRR required this visit?  No   CCM required this visit?  No      Plan:     I have personally reviewed and noted the following in the patient's chart:   Medical and social history Use of alcohol, tobacco or illicit drugs  Current medications and supplements including opioid prescriptions. Patient is not currently taking opioid prescriptions. Functional ability and status Nutritional status Physical activity Advanced directives List of other  physicians Hospitalizations, surgeries, and ER visits in previous 12 months Vitals Screenings to include cognitive, depression, and falls Referrals and appointments  In addition, I have reviewed and discussed with patient certain preventive protocols, quality metrics, and best practice recommendations. A written personalized care plan for preventive services as well as general preventive health recommendations were provided to patient.     Lorrene Reid, LPN   5/64/[id number]   Nurse Notes: none

## 2023-03-05 ENCOUNTER — Encounter: Payer: Self-pay | Admitting: Nurse Practitioner

## 2023-03-05 ENCOUNTER — Ambulatory Visit (INDEPENDENT_AMBULATORY_CARE_PROVIDER_SITE_OTHER): Payer: Medicare HMO | Admitting: Nurse Practitioner

## 2023-03-05 VITALS — BP 106/70 | HR 74 | Temp 96.4°F | Resp 20 | Ht 64.0 in | Wt 131.0 lb

## 2023-03-05 DIAGNOSIS — E119 Type 2 diabetes mellitus without complications: Secondary | ICD-10-CM | POA: Diagnosis not present

## 2023-03-05 DIAGNOSIS — R600 Localized edema: Secondary | ICD-10-CM

## 2023-03-05 LAB — CMP14+EGFR
ALT: 17 IU/L (ref 0–32)
AST: 22 IU/L (ref 0–40)
Albumin/Globulin Ratio: 1.9
Albumin: 4.1 g/dL (ref 3.8–4.8)
Alkaline Phosphatase: 102 IU/L (ref 44–121)
BUN/Creatinine Ratio: 28 (ref 12–28)
BUN: 31 mg/dL — ABNORMAL HIGH (ref 8–27)
Bilirubin Total: 0.7 mg/dL (ref 0.0–1.2)
CO2: 27 mmol/L (ref 20–29)
Calcium: 9.3 mg/dL (ref 8.7–10.3)
Chloride: 99 mmol/L (ref 96–106)
Creatinine, Ser: 1.11 mg/dL — ABNORMAL HIGH (ref 0.57–1.00)
Globulin, Total: 2.2 g/dL (ref 1.5–4.5)
Glucose: 98 mg/dL (ref 70–99)
Potassium: 3.9 mmol/L (ref 3.5–5.2)
Sodium: 142 mmol/L (ref 134–144)
Total Protein: 6.3 g/dL (ref 6.0–8.5)
eGFR: 52 mL/min/{1.73_m2} — ABNORMAL LOW (ref >59)

## 2023-03-05 LAB — BAYER DCA HB A1C WAIVED: HB A1C (BAYER DCA - WAIVED): 5.5 % (ref 4.8–5.6)

## 2023-03-05 MED ORDER — FUROSEMIDE 40 MG PO TABS: ORAL_TABLET | ORAL | 5 refills | Status: DC

## 2023-03-05 NOTE — Patient Instructions (Signed)

## 2023-03-05 NOTE — Progress Notes (Signed)
Subjective:    Patient ID: Sabrina [name], female    DOB: 04-Nov-1945, 77 y.o.   MRN: [id number]   Chief Complaint: Left ankle and left leg swelling and Blood sugars have been high   HPI Patient is a regular patient of Jannifer Rodney, FNP. She comes in today with 2 complaints  - ankles swelling daily. She says some days she will gain 8lbs throughout the day. They go down at night. Denies any SOB. She use to be on a higher dose of lasix but her cardiologist decreased it because she was doing well.  She says in the last  month her blood sugars have been going up and she has been very thirsty. Blood sugars have been good in mornings and afternoons. But at 11pm is as high as 265. Se  is currently on no diabetic medications, she has been doing diet control. Lab Results  Component Value Date   HGBA1C 5.9 09/27/2018     Patient Active Problem List   Diagnosis Date Noted   Coronary artery calcification    Presence of Watchman left atrial appendage closure device 08/01/2021   History of DVT (deep vein thrombosis) 05/07/2021   Atherosclerotic heart disease of native coronary artery without angina pectoris 12/29/2020   Muscle weakness (generalized) 12/29/2020   Other enthesopathy of unspecified foot and ankle 12/29/2020   Presence of other vascular implants and grafts 12/29/2020   Calcaneal bursitis 12/12/2020   Acute upper GI bleed 12/08/2020   Complete heart block (HCC) 11/09/2020   Osteoporosis 10/11/2019   Pacemaker 10/04/2019   Near syncope    Hyperlipidemia 06/23/2016   Colovesical fistula 06/12/2016   Atrial fibrillation with RVR (HCC) 06/12/2016   GERD (gastroesophageal reflux disease) 06/12/2016   Fibromyalgia 06/12/2016   Peripheral neuropathy 06/12/2016   S/P gastric bypass 01/22/2016   S/P splenectomy 01/22/2016   S/P cholecystectomy 01/22/2016   Hx of small bowel obstruction 01/22/2016   Asthma 10/19/2015   Generalized anxiety disorder 10/19/2015   Depression  10/19/2015   Insomnia 10/19/2015   Iron deficiency anemia 10/19/2015   Vitamin D deficiency 11/29/2014   Eczema 11/27/2014   Benign essential HTN 09/25/2014   Chronic diastolic heart failure (HCC) 09/25/2014   Allergic rhinitis 02/28/2013       Review of Systems  Constitutional:  Negative for diaphoresis.  Eyes:  Negative for pain.  Cardiovascular:  Negative for chest pain, palpitations and leg swelling.  Gastrointestinal:  Negative for abdominal pain.  Endocrine: Negative for polydipsia.  Skin:  Negative for rash.  Neurological:  Negative for dizziness, weakness and headaches.  Hematological:  Does not bruise/bleed easily.  All other systems reviewed and are negative.      Objective:   Physical Exam Vitals reviewed.  Constitutional:      Appearance: Normal appearance.  Cardiovascular:     Rate and Rhythm: Normal rate and regular rhythm.     Heart sounds: Normal heart sounds.  Pulmonary:     Effort: Pulmonary effort is normal.     Breath sounds: Normal breath sounds.  Musculoskeletal:     Right lower leg: No edema.     Left lower leg: No edema.  Skin:    General: Skin is warm.  Neurological:     General: No focal deficit present.     Mental Status: She is alert and oriented to person, place, and time.  Psychiatric:        Mood and Affect: Mood normal.  Behavior: Behavior normal.    BP 106/70   Pulse 74   Temp (!) 96.4 F (35.8 C) (Temporal)   Resp 20   Ht 5\' 4"  (1.626 m)   Wt 131 lb (59.4 kg)   SpO2 96%   BMI 22.49 kg/m         Assessment & Plan:  Sabrina [name] comes in today with chief complaint of Left ankle and left leg swelling and Blood sugars have been high   Diagnosis and orders addressed:  1. Peripheral edema Elevated legs when sitting - furosemide (LASIX) 40 MG tablet; Take 1 tablet in AM and 1/2 tablet at 1pm daily  Dispense: 45 tablet; Refill: 5 - [id number]+EGFR  2. Diabetes mellitus type 2, diet-controlled (HCC) Labs  pending - Bayer DCA Hb A1c Waived   Labs pending Health Maintenance reviewed Diet and exercise encouraged  Follow up plan: With PCP   Mary-Margaret Daphine Deutscher, FNP

## 2023-03-05 NOTE — Telephone Encounter (Signed)
CAlled patient and made appointment

## 2023-03-06 ENCOUNTER — Telehealth: Payer: Self-pay | Admitting: Family

## 2023-03-09 ENCOUNTER — Encounter (HOSPITAL_COMMUNITY)
Admission: RE | Admit: 2023-03-09 | Discharge: 2023-03-09 | Disposition: A | Discharge status: Home / Self Care | Payer: Medicare HMO | Source: Ambulatory Visit | Attending: Ophthalmology | Admitting: Ophthalmology

## 2023-03-09 ENCOUNTER — Ambulatory Visit (INDEPENDENT_AMBULATORY_CARE_PROVIDER_SITE_OTHER): Payer: Medicare HMO

## 2023-03-09 DIAGNOSIS — H25811 Combined forms of age-related cataract, right eye: Secondary | ICD-10-CM | POA: Diagnosis not present

## 2023-03-09 DIAGNOSIS — E538 Deficiency of other specified B group vitamins: Secondary | ICD-10-CM

## 2023-03-09 NOTE — Telephone Encounter (Signed)
Left detailed message (dpr) advised pt to call back with any questions.

## 2023-03-09 NOTE — Progress Notes (Signed)
Patient r/ c °

## 2023-03-09 NOTE — Progress Notes (Signed)
Cyanocobalamin injection given to left deltoid.  Patient tolerated well. 
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
## 2023-03-16 ENCOUNTER — Ambulatory Visit (HOSPITAL_COMMUNITY): Payer: Medicare HMO | Admitting: Certified Registered"

## 2023-03-16 ENCOUNTER — Ambulatory Visit (HOSPITAL_BASED_OUTPATIENT_CLINIC_OR_DEPARTMENT_OTHER): Payer: Medicare HMO | Admitting: Certified Registered"

## 2023-03-16 ENCOUNTER — Encounter (HOSPITAL_COMMUNITY): Payer: Self-pay | Admitting: Ophthalmology

## 2023-03-16 ENCOUNTER — Encounter (HOSPITAL_COMMUNITY)
Admission: RE | Disposition: A | Discharge status: Home / Self Care | Payer: Self-pay | Source: Home / Self Care | Attending: Ophthalmology

## 2023-03-16 ENCOUNTER — Ambulatory Visit (HOSPITAL_COMMUNITY)
Admission: RE | Admit: 2023-03-16 | Discharge: 2023-03-16 | Disposition: A | Discharge status: Home / Self Care | Payer: Medicare HMO | Attending: Ophthalmology | Admitting: Ophthalmology

## 2023-03-16 DIAGNOSIS — F32A Depression, unspecified: Secondary | ICD-10-CM | POA: Diagnosis not present

## 2023-03-16 DIAGNOSIS — J449 Chronic obstructive pulmonary disease, unspecified: Secondary | ICD-10-CM | POA: Diagnosis not present

## 2023-03-16 DIAGNOSIS — I2510 Atherosclerotic heart disease of native coronary artery without angina pectoris: Secondary | ICD-10-CM | POA: Insufficient documentation

## 2023-03-16 DIAGNOSIS — H11823 Conjunctivochalasis, bilateral: Secondary | ICD-10-CM | POA: Diagnosis not present

## 2023-03-16 DIAGNOSIS — H25811 Combined forms of age-related cataract, right eye: Secondary | ICD-10-CM

## 2023-03-16 DIAGNOSIS — Z87891 Personal history of nicotine dependence: Secondary | ICD-10-CM | POA: Diagnosis not present

## 2023-03-16 DIAGNOSIS — E1136 Type 2 diabetes mellitus with diabetic cataract: Secondary | ICD-10-CM | POA: Diagnosis not present

## 2023-03-16 DIAGNOSIS — H0100B Unspecified blepharitis left eye, upper and lower eyelids: Secondary | ICD-10-CM | POA: Diagnosis not present

## 2023-03-16 DIAGNOSIS — H52223 Regular astigmatism, bilateral: Secondary | ICD-10-CM | POA: Diagnosis not present

## 2023-03-16 DIAGNOSIS — K219 Gastro-esophageal reflux disease without esophagitis: Secondary | ICD-10-CM | POA: Diagnosis not present

## 2023-03-16 DIAGNOSIS — I110 Hypertensive heart disease with heart failure: Secondary | ICD-10-CM | POA: Insufficient documentation

## 2023-03-16 DIAGNOSIS — H0100A Unspecified blepharitis right eye, upper and lower eyelids: Secondary | ICD-10-CM | POA: Diagnosis not present

## 2023-03-16 DIAGNOSIS — I252 Old myocardial infarction: Secondary | ICD-10-CM

## 2023-03-16 DIAGNOSIS — I509 Heart failure, unspecified: Secondary | ICD-10-CM | POA: Insufficient documentation

## 2023-03-16 DIAGNOSIS — F419 Anxiety disorder, unspecified: Secondary | ICD-10-CM | POA: Insufficient documentation

## 2023-03-16 DIAGNOSIS — H40013 Open angle with borderline findings, low risk, bilateral: Secondary | ICD-10-CM | POA: Insufficient documentation

## 2023-03-16 HISTORY — PX: CATARACT EXTRACTION W/PHACO: SHX586

## 2023-03-16 LAB — GLUCOSE, CAPILLARY: Glucose-Capillary: 88 mg/dL (ref 70–99)

## 2023-03-16 SURGERY — PHACOEMULSIFICATION, CATARACT, WITH IOL INSERTION
Anesthesia: Monitor Anesthesia Care | Site: Eye | Laterality: Right

## 2023-03-16 MED ORDER — POVIDONE-IODINE 5 % OP SOLN
OPHTHALMIC | Status: DC | PRN
  Administered 2023-03-16: 1 via OPHTHALMIC

## 2023-03-16 MED ORDER — EPINEPHRINE PF 1 MG/ML IJ SOLN
INTRAMUSCULAR | Status: AC
  Filled 2023-03-16: qty 1

## 2023-03-16 MED ORDER — LIDOCAINE HCL 3.5 % OP GEL
1.0000 | Freq: Once | OPHTHALMIC | Status: AC
  Administered 2023-03-16: 1 via OPHTHALMIC

## 2023-03-16 MED ORDER — PHENYLEPHRINE HCL 2.5 % OP SOLN
1.0000 [drp] | OPHTHALMIC | Status: AC | PRN
  Administered 2023-03-16 (×3): 1 [drp] via OPHTHALMIC

## 2023-03-16 MED ORDER — NEOMYCIN-POLYMYXIN-DEXAMETH 3.5-10000-0.1 OP SUSP
OPHTHALMIC | Status: DC | PRN
  Administered 2023-03-16: 2 [drp] via OPHTHALMIC

## 2023-03-16 MED ORDER — LIDOCAINE HCL (PF) 1 % IJ SOLN
INTRAOCULAR | Status: DC | PRN
  Administered 2023-03-16: 1 mL via OPHTHALMIC

## 2023-03-16 MED ORDER — SODIUM HYALURONATE 10 MG/ML IO SOLUTION
PREFILLED_SYRINGE | INTRAOCULAR | Status: DC | PRN
  Administered 2023-03-16: .85 mL via INTRAOCULAR

## 2023-03-16 MED ORDER — STERILE WATER FOR IRRIGATION IR SOLN
Status: DC | PRN
  Administered 2023-03-16: 250 mL

## 2023-03-16 MED ORDER — BSS IO SOLN
INTRAOCULAR | Status: DC | PRN
  Administered 2023-03-16: 15 mL via INTRAOCULAR

## 2023-03-16 MED ORDER — TETRACAINE HCL 0.5 % OP SOLN
1.0000 [drp] | OPHTHALMIC | Status: AC | PRN
  Administered 2023-03-16 (×3): 1 [drp] via OPHTHALMIC

## 2023-03-16 MED ORDER — SODIUM HYALURONATE 23MG/ML IO SOSY
PREFILLED_SYRINGE | INTRAOCULAR | Status: DC | PRN
  Administered 2023-03-16: .6 mL via INTRAOCULAR

## 2023-03-16 MED ORDER — TROPICAMIDE 1 % OP SOLN
1.0000 [drp] | OPHTHALMIC | Status: AC | PRN
  Administered 2023-03-16 (×3): 1 [drp] via OPHTHALMIC

## 2023-03-16 MED ORDER — EPINEPHRINE PF 1 MG/ML IJ SOLN
INTRAOCULAR | Status: DC | PRN
  Administered 2023-03-16: 500 mL

## 2023-03-16 MED ORDER — PHENYLEPHRINE-KETOROLAC 1-0.3 % IO SOLN
INTRAOCULAR | Status: AC
  Filled 2023-03-16: qty 4

## 2023-03-16 SURGICAL SUPPLY — 13 items
CATARACT SUITE SIGHTPATH (MISCELLANEOUS) ×1 IMPLANT
CLOTH BEACON ORANGE TIMEOUT ST (SAFETY) ×1 IMPLANT
EYE SHIELD UNIVERSAL CLEAR (GAUZE/BANDAGES/DRESSINGS) IMPLANT
FEE CATARACT SUITE SIGHTPATH (MISCELLANEOUS) ×1 IMPLANT
GLOVE BIOGEL PI IND STRL 7.0 (GLOVE) ×2 IMPLANT
LENS IOL TECNIS EYHANCE 22.0 (Intraocular Lens) IMPLANT
NDL HYPO 18GX1.5 BLUNT FILL (NEEDLE) ×1 IMPLANT
NEEDLE HYPO 18GX1.5 BLUNT FILL (NEEDLE) ×1 IMPLANT
PAD ARMBOARD 7.5X6 YLW CONV (MISCELLANEOUS) ×1 IMPLANT
RING MALYGIN 7.0 (MISCELLANEOUS) IMPLANT
SYR TB 1ML LL NO SAFETY (SYRINGE) ×1 IMPLANT
TAPE SURG TRANSPORE 1 IN (GAUZE/BANDAGES/DRESSINGS) IMPLANT
WATER STERILE IRR 250ML POUR (IV SOLUTION) ×1 IMPLANT

## 2023-03-16 NOTE — Op Note (Signed)
Date of procedure: 03/16/23  Pre-operative diagnosis:  Visually significant combined form age-related cataract, Right Eye (H25.811)  Post-operative diagnosis:  Visually significant combined form age-related cataract, Right Eye (H25.811)  Procedure: Removal of cataract via phacoemulsification and insertion of intra-ocular lens [employer] DIB00 +22.0D into the capsular bag of the Right Eye  Attending surgeon: Rudy Jew. Doratha Mcswain, MD, MA  Anesthesia: MAC, Topical Akten  Complications: None  Estimated Blood Loss: <44mL (minimal)  Specimens: None  Implants: As above  Indications:  Visually significant age-related cataract, Right Eye  Procedure:  The patient was seen and identified in the pre-operative area. The operative eye was identified and dilated.  The operative eye was marked.  Topical anesthesia was administered to the operative eye.     The patient was then to the operative suite and placed in the supine position.  A timeout was performed confirming the patient, procedure to be performed, and all other relevant information.   The patient's face was prepped and draped in the usual fashion for intra-ocular surgery.  A lid speculum was placed into the operative eye and the surgical microscope moved into place and focused.  A superotemporal paracentesis was created using a 20 gauge paracentesis blade.  Shugarcaine was injected into the anterior chamber.  Viscoelastic was injected into the anterior chamber.  A temporal clear-corneal main wound incision was created using a 2.76mm microkeratome.  A continuous curvilinear capsulorrhexis was initiated using an irrigating cystitome and completed using capsulorrhexis forceps.  Hydrodissection and hydrodeliniation were performed.  Viscoelastic was injected into the anterior chamber.  A phacoemulsification handpiece and a chopper as a second instrument were used to remove the nucleus and epinucleus. The irrigation/aspiration handpiece was used to  remove any remaining cortical material.   The capsular bag was reinflated with viscoelastic, checked, and found to be intact.  The intraocular lens was inserted into the capsular bag.  The irrigation/aspiration handpiece was used to remove any remaining viscoelastic.  The clear corneal wound and paracentesis wounds were then hydrated and checked with Weck-Cels to be watertight. Maxitrol drops were instilled into the operative eye.  The lid-speculum was removed.  The drape was removed.  The patient's face was cleaned with a wet and dry 4x4. A clear shield was taped over the eye. The patient was taken to the post-operative care unit in good condition, having tolerated the procedure well.  Post-Op Instructions: The patient will follow up at [facility] for a same day post-operative evaluation and will receive all other orders and instructions.

## 2023-03-16 NOTE — Anesthesia Procedure Notes (Signed)
Procedure Name: MAC Date/Time: 03/16/2023 8:52 AM  Performed by: Julian Reil, CRNAPre-anesthesia Checklist: Emergency Drugs available, Patient identified, Suction available and Patient being monitored Patient Re-evaluated:Patient Re-evaluated prior to induction Oxygen Delivery Method: Nasal cannula Placement Confirmation: positive ETCO2

## 2023-03-16 NOTE — Interval H&P Note (Signed)
History and Physical Interval Note:  03/16/2023 8:44 AM  Sabrina [name]  has presented today for surgery, with the diagnosis of combined forms age related cataract; right.  The various methods of treatment have been discussed with the patient and family. After consideration of risks, benefits and other options for treatment, the patient has consented to  Procedure(s): CATARACT EXTRACTION PHACO AND INTRAOCULAR LENS PLACEMENT (IOC) (Right) as a surgical intervention.  The patient's history has been reviewed, patient examined, no change in status, stable for surgery.  I have reviewed the patient's chart and labs.  Questions were answered to the patient's satisfaction.     [name]

## 2023-03-16 NOTE — Anesthesia Preprocedure Evaluation (Signed)
Anesthesia Evaluation  Patient identified by MRN, date of birth, ID band Patient awake    Reviewed: Allergy & Precautions, H&P , NPO status , Patient's Chart, lab work & pertinent test results, reviewed documented beta blocker date and time   Airway Mallampati: II  TM Distance: >3 FB Neck ROM: full    Dental no notable dental hx.    Pulmonary neg pulmonary ROS, asthma , COPD, former smoker   Pulmonary exam normal breath sounds clear to auscultation       Cardiovascular Exercise Tolerance: Good hypertension, + CAD, + Past MI and +CHF  negative cardio ROS + dysrhythmias + pacemaker  Rhythm:irregular Rate:Normal     Neuro/Psych  PSYCHIATRIC DISORDERS Anxiety Depression     Neuromuscular disease negative neurological ROS  negative psych ROS   GI/Hepatic negative GI ROS, Neg liver ROS,GERD  ,,  Endo/Other  negative endocrine ROS    Renal/GU negative Renal ROS  negative genitourinary   Musculoskeletal   Abdominal   Peds  Hematology negative hematology ROS (+) Blood dyscrasia, anemia   Anesthesia Other Findings   Reproductive/Obstetrics negative OB ROS                             Anesthesia Physical Anesthesia Plan  ASA: 3  Anesthesia Plan: MAC   Post-op Pain Management:    Induction:   PONV Risk Score and Plan:   Airway Management Planned:   Additional Equipment:   Intra-op Plan:   Post-operative Plan:   Informed Consent: I have reviewed the patients History and Physical, chart, labs and discussed the procedure including the risks, benefits and alternatives for the proposed anesthesia with the patient or authorized representative who has indicated his/her understanding and acceptance.     Dental Advisory Given  Plan Discussed with: CRNA  Anesthesia Plan Comments:        Anesthesia Quick Evaluation

## 2023-03-16 NOTE — Transfer of Care (Signed)
Immediate Anesthesia Transfer of Care Note  Patient: Sabrina [name]  Procedure(s) Performed: CATARACT EXTRACTION PHACO AND INTRAOCULAR LENS PLACEMENT (IOC) (Right: Eye)  Patient Location: Short Stay  Anesthesia Type:MAC  Level of Consciousness: awake, alert , and oriented  Airway & Oxygen Therapy: Patient Spontanous Breathing  Post-op Assessment: Report given to RN and Post -op Vital signs reviewed and stable  Post vital signs: Reviewed and stable  Last Vitals:  Vitals Value Taken Time  BP    Temp    Pulse    Resp    SpO2      Last Pain:  Vitals:   03/16/23 0801  PainSc: 0-No pain         Complications: No notable events documented.

## 2023-03-16 NOTE — Discharge Instructions (Signed)
Please discharge patient when stable, will follow up today with Dr. Zoe Creasman at the [facility] [place] office immediately following discharge.  Leave shield in place until visit.  All paperwork with discharge instructions will be given at the office.  [facility] Address:  [address]  [place], [place] [address]  

## 2023-03-16 NOTE — Anesthesia Postprocedure Evaluation (Signed)
Anesthesia Post Note  Patient: MIANNA IEZZI  Procedure(s) Performed: CATARACT EXTRACTION PHACO AND INTRAOCULAR LENS PLACEMENT (IOC) (Right: Eye)  Patient location during evaluation: Phase II Anesthesia Type: MAC Level of consciousness: awake Pain management: pain level controlled Vital Signs Assessment: post-procedure vital signs reviewed and stable Respiratory status: spontaneous breathing and respiratory function stable Cardiovascular status: blood pressure returned to baseline and stable Postop Assessment: no headache and no apparent nausea or vomiting Anesthetic complications: no Comments: Late entry   No notable events documented.   Last Vitals:  Vitals:   03/16/23 0802 03/16/23 0909  BP: 120/74 129/72  Pulse: 70 71  Resp: 20 18  Temp: 36.6 C 36.6 C  SpO2: 100% 100%    Last Pain:  Vitals:   03/16/23 0909  TempSrc: Oral  PainSc: 0-No pain                 Windell Norfolk

## 2023-03-19 ENCOUNTER — Encounter (HOSPITAL_COMMUNITY): Payer: Self-pay | Admitting: Ophthalmology

## 2023-03-24 ENCOUNTER — Ambulatory Visit (INDEPENDENT_AMBULATORY_CARE_PROVIDER_SITE_OTHER): Payer: Medicare HMO | Admitting: Family

## 2023-03-24 ENCOUNTER — Encounter: Payer: Self-pay | Admitting: Family

## 2023-03-24 VITALS — BP 95/62 | HR 66 | Temp 97.0°F | Ht 64.0 in | Wt 129.4 lb

## 2023-03-24 DIAGNOSIS — G8929 Other chronic pain: Secondary | ICD-10-CM | POA: Diagnosis not present

## 2023-03-24 DIAGNOSIS — J4520 Mild intermittent asthma, uncomplicated: Secondary | ICD-10-CM | POA: Diagnosis not present

## 2023-03-24 DIAGNOSIS — R600 Localized edema: Secondary | ICD-10-CM

## 2023-03-24 DIAGNOSIS — F411 Generalized anxiety disorder: Secondary | ICD-10-CM

## 2023-03-24 DIAGNOSIS — I10 Essential (primary) hypertension: Secondary | ICD-10-CM

## 2023-03-24 DIAGNOSIS — Z0001 Encounter for general adult medical examination with abnormal findings: Secondary | ICD-10-CM | POA: Diagnosis not present

## 2023-03-24 DIAGNOSIS — M26609 Unspecified temporomandibular joint disorder, unspecified side: Secondary | ICD-10-CM

## 2023-03-24 DIAGNOSIS — I959 Hypotension, unspecified: Secondary | ICD-10-CM

## 2023-03-24 DIAGNOSIS — M810 Age-related osteoporosis without current pathological fracture: Secondary | ICD-10-CM

## 2023-03-24 DIAGNOSIS — G4700 Insomnia, unspecified: Secondary | ICD-10-CM

## 2023-03-24 DIAGNOSIS — I2510 Atherosclerotic heart disease of native coronary artery without angina pectoris: Secondary | ICD-10-CM | POA: Diagnosis not present

## 2023-03-24 DIAGNOSIS — D509 Iron deficiency anemia, unspecified: Secondary | ICD-10-CM

## 2023-03-24 DIAGNOSIS — Z0000 Encounter for general adult medical examination without abnormal findings: Secondary | ICD-10-CM | POA: Diagnosis not present

## 2023-03-24 DIAGNOSIS — E782 Mixed hyperlipidemia: Secondary | ICD-10-CM

## 2023-03-24 DIAGNOSIS — M545 Low back pain, unspecified: Secondary | ICD-10-CM

## 2023-03-24 DIAGNOSIS — K219 Gastro-esophageal reflux disease without esophagitis: Secondary | ICD-10-CM

## 2023-03-24 DIAGNOSIS — F331 Major depressive disorder, recurrent, moderate: Secondary | ICD-10-CM

## 2023-03-24 MED ORDER — ALBUTEROL SULFATE HFA 108 (90 BASE) MCG/ACT IN AERS: INHALATION_SPRAY | RESPIRATORY_TRACT | 1 refills | Status: DC

## 2023-03-24 MED ORDER — FUROSEMIDE 20 MG PO TABS: 20.0000 mg | ORAL_TABLET | Freq: Every day | ORAL | 1 refills | Status: DC

## 2023-03-24 MED ORDER — DULOXETINE HCL 60 MG PO CPEP
ORAL_CAPSULE | ORAL | 0 refills | Status: DC
Start: 2023-03-24 — End: 2023-07-27

## 2023-03-24 MED ORDER — FUROSEMIDE 40 MG PO TABS: ORAL_TABLET | ORAL | 5 refills | Status: DC

## 2023-03-24 MED ORDER — FLUTICASONE PROPIONATE 50 MCG/ACT NA SUSP
NASAL | 0 refills | Status: AC
Start: 2023-03-24 — End: ?

## 2023-03-24 MED ORDER — GABAPENTIN 100 MG PO CAPS
ORAL_CAPSULE | ORAL | 1 refills | Status: DC
Start: 2023-03-24 — End: 2023-11-24

## 2023-03-24 MED ORDER — PANTOPRAZOLE SODIUM 40 MG PO TBEC
40.0000 mg | DELAYED_RELEASE_TABLET | Freq: Two times a day (BID) | ORAL | 1 refills | Status: DC
Start: 2023-03-24 — End: 2023-09-17

## 2023-03-24 MED ORDER — BACLOFEN 10 MG PO TABS
ORAL_TABLET | ORAL | 1 refills | Status: DC
Start: 2023-03-24 — End: 2023-03-24

## 2023-03-24 MED ORDER — SYMBICORT 80-4.5 MCG/ACT IN AERO: 2.0000 | INHALATION_SPRAY | Freq: Two times a day (BID) | RESPIRATORY_TRACT | 2 refills | Status: DC

## 2023-03-24 NOTE — Patient Instructions (Signed)
Health Maintenance After Age 76 After age 76, you are at a higher risk for certain long-term diseases and infections as well as injuries from falls. Falls are a major cause of broken bones and head injuries in people who are older than age 76. Getting regular preventive care can help to keep you healthy and well. Preventive care includes getting regular testing and making lifestyle changes as recommended by your health care provider. Talk with your health care provider about: Which screenings and tests you should have. A screening is a test that checks for a disease when you have no symptoms. A diet and exercise plan that is right for you. What should I know about screenings and tests to prevent falls? Screening and testing are the best ways to find a health problem early. Early diagnosis and treatment give you the best chance of managing medical conditions that are common after age 76. Certain conditions and lifestyle choices may make you more likely to have a fall. Your health care provider may recommend: Regular vision checks. Poor vision and conditions such as cataracts can make you more likely to have a fall. If you wear glasses, make sure to get your prescription updated if your vision changes. Medicine review. Work with your health care provider to regularly review all of the medicines you are taking, including over-the-counter medicines. Ask your health care provider about any side effects that may make you more likely to have a fall. Tell your health care provider if any medicines that you take make you feel dizzy or sleepy. Strength and balance checks. Your health care provider may recommend certain tests to check your strength and balance while standing, walking, or changing positions. Foot health exam. Foot pain and numbness, as well as not wearing proper footwear, can make you more likely to have a fall. Screenings, including: Osteoporosis screening. Osteoporosis is a condition that causes  the bones to get weaker and break more easily. Blood pressure screening. Blood pressure changes and medicines to control blood pressure can make you feel dizzy. Depression screening. You may be more likely to have a fall if you have a fear of falling, feel depressed, or feel unable to do activities that you used to do. Alcohol use screening. Using too much alcohol can affect your balance and may make you more likely to have a fall. Follow these instructions at home: Lifestyle Do not drink alcohol if: Your health care provider tells you not to drink. If you drink alcohol: Limit how much you have to: 0-1 drink a day for women. 0-2 drinks a day for men. Know how much alcohol is in your drink. In the [place] one drink equals one 12 oz bottle of beer (355 mL), one 5 oz glass of wine (148 mL), or one 1 oz glass of hard liquor (44 mL). Do not use any products that contain nicotine or tobacco. These products include cigarettes, chewing tobacco, and vaping devices, such as e-cigarettes. If you need help quitting, ask your health care provider. Activity  Follow a regular exercise program to stay fit. This will help you maintain your balance. Ask your health care provider what types of exercise are appropriate for you. If you need a cane or walker, use it as recommended by your health care provider. Wear supportive shoes that have nonskid soles. Safety  Remove any tripping hazards, such as rugs, cords, and clutter. Install safety equipment such as grab bars in bathrooms and safety rails on stairs. Keep rooms and walkways   well-lit. General instructions Talk with your health care provider about your risks for falling. Tell your health care provider if: You fall. Be sure to tell your health care provider about all falls, even ones that seem minor. You feel dizzy, tiredness (fatigue), or off-balance. Take over-the-counter and prescription medicines only as told by your health care provider. These include  supplements. Eat a healthy diet and maintain a healthy weight. A healthy diet includes low-fat dairy products, low-fat (lean) meats, and fiber from whole grains, beans, and lots of fruits and vegetables. Stay current with your vaccines. Schedule regular health, dental, and eye exams. Summary Having a healthy lifestyle and getting preventive care can help to protect your health and wellness after age 76. Screening and testing are the best way to find a health problem early and help you avoid having a fall. Early diagnosis and treatment give you the best chance for managing medical conditions that are more common for people who are older than age 76. Falls are a major cause of broken bones and head injuries in people who are older than age 76. Take precautions to prevent a fall at home. Work with your health care provider to learn what changes you can make to improve your health and wellness and to prevent falls. This information is not intended to replace advice given to you by your health care provider. Make sure you discuss any questions you have with your health care provider. Document Revised: 01/28/2021 Document Reviewed: 01/28/2021 [employer] Patient Education  2024 [employer]  

## 2023-03-24 NOTE — Progress Notes (Signed)
Subjective:    Patient ID: Sabrina [name], female    DOB: 13-Sep-1946, 77 y.o.   MRN: [id number]  Chief Complaint  Patient presents with   Medical Management of Chronic Issues    Not fasting, wants to increase gabapentin   Pt presents to the office today for CPE and chronic follow up. Pt is followed by Cardiologists every 4 months for A Fib and CHF. Has a pacemaker.   Has atherosclerosis and takes Lipitor daily.    She has osteoporosis and takes Fosamax weekly. Takes Vit D and Calcium daily. Last Dexa scan 12/25/21.   She has peripheral neuropathy and takes gabapentin 100 mg BID.   Had right cataract surgery on 03/16/23 and scheduled for left cataract on 04/10/23. Doing well.   Hypotension today.  Congestive Heart Failure Presents for follow-up visit. Associated symptoms include fatigue and shortness of breath. Pertinent negatives include no edema. The symptoms have been stable.  Asthma She complains of shortness of breath. There is no cough or wheezing. This is a chronic problem. The current episode started more than 1 year ago. The problem occurs intermittently. Associated symptoms include heartburn and malaise/fatigue. She reports moderate improvement on treatment. Her past medical history is significant for asthma.  Gastroesophageal Reflux She complains of belching and heartburn. She reports no coughing or no wheezing. This is a chronic problem. The current episode started more than 1 year ago. The problem occurs occasionally. Associated symptoms include fatigue. She has tried a PPI for the symptoms. The treatment provided moderate relief.  Anemia Presents for follow-up visit. Symptoms include malaise/fatigue. There has been no bruising/bleeding easily.  Insomnia Primary symptoms: difficulty falling asleep, frequent awakening, malaise/fatigue.   The current episode started more than one year. The onset quality is gradual. The problem occurs intermittently. Past treatments  include medication. The treatment provided moderate relief. PMH includes: depression.   Hyperlipidemia This is a chronic problem. The current episode started more than 1 year ago. The problem is controlled. Recent lipid tests were reviewed and are normal. Associated symptoms include shortness of breath. Current antihyperlipidemic treatment includes statins. The current treatment provides moderate improvement of lipids. Risk factors for coronary artery disease include dyslipidemia, hypertension and a sedentary lifestyle.  Anxiety Presents for follow-up visit. Symptoms include excessive worry, insomnia, nervous/anxious behavior and shortness of breath. Symptoms occur most days. The severity of symptoms is moderate.   Her past medical history is significant for anemia and asthma.  Depression        This is a chronic problem.  The current episode started more than 1 year ago.   The onset quality is gradual.   The problem occurs intermittently.  Associated symptoms include fatigue, insomnia and sad.  Associated symptoms include no helplessness and no hopelessness.  Past treatments include SNRIs - Serotonin and norepinephrine reuptake inhibitors.  Past medical history includes anxiety.       Review of Systems  Constitutional:  Positive for fatigue and malaise/fatigue.  Respiratory:  Positive for shortness of breath. Negative for cough and wheezing.   Gastrointestinal:  Positive for heartburn.  Hematological:  Does not bruise/bleed easily.  Psychiatric/Behavioral:  Positive for depression. The patient is nervous/anxious and has insomnia.   All other systems reviewed and are negative.  Family History  Problem Relation Age of Onset   Cancer Mother    Alzheimer's disease Mother    Heart disease Mother    Arthritis Father    Asthma Daughter    Arthritis Daughter  Obesity Daughter    Colon cancer Paternal Uncle 4   Arthritis Son    Hyperlipidemia Son    Obesity Son    Arthritis Son     Obesity Son    BRCA 1/2 Neg Hx    Social History   Socioeconomic History   Marital status: Divorced    Spouse name: Not on file   Number of children: 3   Years of education: 2 years of college   Highest education level: Some college, no degree  Occupational History   Occupation: Retired  Tobacco Use   Smoking status: Former    Packs/day: 0.50    Years: 25.00    Additional pack years: 0.00    Total pack years: 12.50    Types: Cigarettes    Quit date: 02/26/1993    Years since quitting: 30.0   Smokeless tobacco: Never  Vaping Use   Vaping Use: Never used  Substance and Sexual Activity   Alcohol use: Not Currently    Comment: 08/27/2017 "nothing since early 2000s"   Drug use: Not Currently   Sexual activity: Not Currently    Birth control/protection: Surgical  Other Topics Concern   Not on file  Social History Narrative   ** Merged History Encounter **       Pt is right handed Lives in single story home with her grandson Has 3 adult children Associated degree  Retired Lawyer   Social Determinants of Corporate investment banker Strain: Low Risk  (03/04/2023)   Overall Financial Resource Strain (CARDIA)    Difficulty of Paying Living Expenses: Not hard at all  Food Insecurity: No Food Insecurity (03/04/2023)   Hunger Vital Sign    Worried About Running Out of Food in the Last Year: Never true    Ran Out of Food in the Last Year: Never true  Transportation Needs: No Transportation Needs (03/04/2023)   PRAPARE - Administrator, Civil Service (Medical): No    Lack of Transportation (Non-Medical): No  Physical Activity: Insufficiently Active (03/04/2023)   Exercise Vital Sign    Days of Exercise per Week: 3 days    Minutes of Exercise per Session: 30 min  Stress: No Stress Concern Present (03/04/2023)   [employer] of Occupational Health - Occupational Stress Questionnaire    Feeling of Stress : Not at all  Social Connections: Moderately Integrated  (03/04/2023)   Social Connection and Isolation Panel [NHANES]    Frequency of Communication with Friends and Family: More than three times a week    Frequency of Social Gatherings with Friends and Family: More than three times a week    Attends Religious Services: More than 4 times per year    Active Member of [employer] or Organizations: Yes    Attends Engineer, structural: More than 4 times per year    Marital Status: Divorced       Objective:   Physical Exam Vitals reviewed.  Constitutional:      General: She is not in acute distress.    Appearance: She is well-developed.  HENT:     Head: Normocephalic and atraumatic.     Right Ear: Tympanic membrane normal.     Left Ear: Tympanic membrane normal.  Eyes:     Pupils: Pupils are equal, round, and reactive to light.  Neck:     Thyroid: No thyromegaly.  Cardiovascular:     Rate and Rhythm: Normal rate and regular rhythm.     Heart  sounds: Normal heart sounds. No murmur heard. Pulmonary:     Effort: Pulmonary effort is normal. No respiratory distress.     Breath sounds: Normal breath sounds. No wheezing.  Abdominal:     General: Bowel sounds are normal. There is no distension.     Palpations: Abdomen is soft.     Tenderness: There is no abdominal tenderness.  Musculoskeletal:        General: No tenderness. Normal range of motion.     Cervical back: Normal range of motion and neck supple.  Skin:    General: Skin is warm and dry.  Neurological:     Mental Status: She is alert and oriented to person, place, and time.     Cranial Nerves: No cranial nerve deficit.     Deep Tendon Reflexes: Reflexes are normal and symmetric.  Psychiatric:        Behavior: Behavior normal.        Thought Content: Thought content normal.        Judgment: Judgment normal.      BP 94/73   Pulse 66   Temp (!) 97 F (36.1 C) (Temporal)   Ht 5\' 4"  (1.626 m)   Wt 129 lb 6.4 oz (58.7 kg)   SpO2 95%   BMI 22.21 kg/m       Assessment &  Plan:   ARWYN GIARDINA comes in today with chief complaint of Medical Management of Chronic Issues (Not fasting, wants to increase gabapentin)   Diagnosis and orders addressed:  1. Chronic bilateral low back pain without sciatica - CMP14+EGFR  2. GAD (generalized anxiety disorder) - DULoxetine (CYMBALTA) 60 MG capsule; TAKE 1 CAPSULE EVERY DAY (NEEDS TO BE SEEN BEFORE NEXT REFILL)  Dispense: 90 capsule; Refill: 0 - CMP14+EGFR  3. Mild intermittent asthma without complication - fluticasone (FLONASE) 50 MCG/ACT nasal spray; USE 2 SPRAYS IN EACH NOSTRIL EVERY DAY  Dispense: 48 g; Refill: 0 - CMP14+EGFR  4. Peripheral edema - CMP14+EGFR  5. TMJ (temporomandibular joint disorder) - gabapentin (NEURONTIN) 100 MG capsule; 100 mg in AM and 300 mg in evening  Dispense: 360 capsule; Refill: 1 - CMP14+EGFR  6. Gastroesophageal reflux disease, unspecified whether esophagitis presen - pantoprazole (PROTONIX) 40 MG tablet; Take 1 tablet (40 mg total) by mouth 2 (two) times daily.  Dispense: 180 tablet; Refill: 1 - CMP14+EGFR  7. Atherosclerosis of native coronary artery of native heart without angina pectoris  - CMP14+EGFR  8. Benign essential HTN - CMP14+EGFR  9. Moderate episode of recurrent major depressive disorder (HCC) - CMP14+EGFR  10. Generalized anxiety disorder - CMP14+EGFR  11. Mixed hyperlipidemia - CMP14+EGFR - Lipid panel  12. Insomnia, unspecified type  - CMP14+EGFR  13. Osteoporosis, unspecified osteoporosis type, unspecified pathological fracture presence - CMP14+EGFR  14. Iron deficiency anemia, unspecified iron deficiency anemia type - CMP14+EGFR  15. Annual physical exam - CBC with Differential/Platelet - CMP14+EGFR - Lipid panel - TSH  16. Hypotension, unspecified hypotension type Will stop baclofen since no pain Will decrease Lasix to 20 mg from 40 mg given so edema noted. Can take an extra pill if gains 3 lb in one day or 5 lb in week.     Labs pending Will increase gabapentin to 100 mg in AM and 300 mg in PM from 100 mg BID Health Maintenance reviewed Diet and exercise encouraged  Follow up plan: 1 month to recheck hypotension   Jannifer Rodney, FNP

## 2023-03-25 LAB — LIPID PANEL
Chol/HDL Ratio: 3.2 ratio (ref 0.0–4.4)
Cholesterol, Total: 197 mg/dL (ref 100–199)
HDL: 62 mg/dL (ref >39)
LDL Chol Calc (NIH): 114 mg/dL — ABNORMAL HIGH (ref 0–99)
Triglycerides: 120 mg/dL (ref 0–149)
VLDL Cholesterol Cal: 21 mg/dL (ref 5–40)

## 2023-03-25 LAB — CMP14+EGFR
ALT: 13 IU/L (ref 0–32)
AST: 17 IU/L (ref 0–40)
Albumin: 3.9 g/dL (ref 3.8–4.8)
Alkaline Phosphatase: 98 IU/L (ref 44–121)
BUN/Creatinine Ratio: 29 — ABNORMAL HIGH (ref 12–28)
BUN: 26 mg/dL (ref 8–27)
Bilirubin Total: 0.6 mg/dL (ref 0.0–1.2)
CO2: 29 mmol/L (ref 20–29)
Calcium: 9.2 mg/dL (ref 8.7–10.3)
Chloride: 99 mmol/L (ref 96–106)
Creatinine, Ser: 0.89 mg/dL (ref 0.57–1.00)
Globulin, Total: 2.5 g/dL (ref 1.5–4.5)
Glucose: 102 mg/dL — ABNORMAL HIGH (ref 70–99)
Potassium: 4.3 mmol/L (ref 3.5–5.2)
Sodium: 141 mmol/L (ref 134–144)
Total Protein: 6.4 g/dL (ref 6.0–8.5)
eGFR: 68 mL/min/{1.73_m2} (ref >59)

## 2023-03-25 LAB — CBC WITH DIFFERENTIAL/PLATELET
Basophils Absolute: 0.1 10*3/uL (ref 0.0–0.2)
Basos: 1 %
EOS (ABSOLUTE): 0.1 10*3/uL (ref 0.0–0.4)
Eos: 2 %
Hematocrit: 40.2 % (ref 34.0–46.6)
Hemoglobin: 12.6 g/dL (ref 11.1–15.9)
Immature Grans (Abs): 0 10*3/uL (ref 0.0–0.1)
Immature Granulocytes: 0 %
Lymphocytes Absolute: 1.3 10*3/uL (ref 0.7–3.1)
Lymphs: 22 %
MCH: 26.8 pg (ref 26.6–33.0)
MCHC: 31.3 g/dL — ABNORMAL LOW (ref 31.5–35.7)
MCV: 85 fL (ref 79–97)
Monocytes Absolute: 0.4 10*3/uL (ref 0.1–0.9)
Monocytes: 7 %
Neutrophils Absolute: 4 10*3/uL (ref 1.4–7.0)
Neutrophils: 68 %
Platelets: 375 10*3/uL (ref 150–450)
RBC: 4.71 x10E6/uL (ref 3.77–5.28)
RDW: 15.2 % (ref 11.7–15.4)
WBC: 5.8 10*3/uL (ref 3.4–10.8)

## 2023-03-25 LAB — TSH: TSH: 2.84 u[IU]/mL (ref 0.450–4.500)

## 2023-03-27 ENCOUNTER — Ambulatory Visit: Payer: Medicare HMO

## 2023-03-27 DIAGNOSIS — I442 Atrioventricular block, complete: Secondary | ICD-10-CM

## 2023-03-28 LAB — CUP PACEART REMOTE DEVICE CHECK
Battery Remaining Longevity: 42 mo
Battery Voltage: 2.87 V
Brady Statistic AP VP Percent: 0.32 %
Brady Statistic AP VS Percent: 99.6 %
Brady Statistic AS VP Percent: 0 %
Brady Statistic AS VS Percent: 0.08 %
Brady Statistic RA Percent Paced: 100 %
Brady Statistic RV Percent Paced: 0.32 %
Date Time Interrogation Session: 20240706022937
Implantable Lead Connection Status: 753985
Implantable Lead Connection Status: 753985
Implantable Lead Implant Date: 20181206
Implantable Lead Implant Date: 20181206
Implantable Lead Location: 753860
Implantable Lead Location: 753860
Implantable Lead Model: 3830
Implantable Lead Model: 5076
Implantable Pulse Generator Implant Date: 20181206
Lead Channel Impedance Value: 266 Ohm
Lead Channel Impedance Value: 323 Ohm
Lead Channel Impedance Value: 380 Ohm
Lead Channel Impedance Value: 418 Ohm
Lead Channel Sensing Intrinsic Amplitude: 14.125 mV
Lead Channel Sensing Intrinsic Amplitude: 14.125 mV
Lead Channel Sensing Intrinsic Amplitude: 3.625 mV
Lead Channel Sensing Intrinsic Amplitude: 3.625 mV
Lead Channel Setting Pacing Amplitude: 2 V
Lead Channel Setting Pacing Amplitude: 2.5 V
Lead Channel Setting Pacing Pulse Width: 0.3 ms
Lead Channel Setting Sensing Sensitivity: 2 mV
Zone Setting Status: 755011
Zone Setting Status: 755011

## 2023-04-08 ENCOUNTER — Encounter (HOSPITAL_COMMUNITY)
Admission: RE | Admit: 2023-04-08 | Discharge: 2023-04-08 | Disposition: A | Discharge status: Home / Self Care | Payer: Medicare HMO | Source: Ambulatory Visit | Attending: Ophthalmology | Admitting: Ophthalmology

## 2023-04-08 NOTE — H&P (Signed)
Surgical History & Physical  Patient Name: Sabrina [name]  DOB: [birth year]  Surgery: Cataract extraction with intraocular lens implant phacoemulsification; Left Eye Surgeon: [name] MD Surgery Date: 04/10/2023 Pre-Op Date: 03/19/2023  HPI: A 52 Yr. old female patient present for 3 day post op OD. Patient states eye has been sore off and on since sx, not bad. Vision is improving every day. Difficulties recognizing peoples faces from a distance, seeing captions on tv, reading fine print, daily activities with OS. This is negatively affecting the patient's quality of life and the patient is unable to function adequately in life with the current level of vision.  Patient would like to proceed with cataract sx OS.  Medical History: Cataracts  Diabetes - DM Type 2 Heart Problem Lung Problems  Review of Systems Negative Allergic/Immunologic Negative Cardiovascular Negative Constitutional Negative Ear, Nose, Mouth & Throat Negative Endocrine Negative Eyes Negative Gastrointestinal Negative Genitourinary Negative Hemotologic/Lymphatic Negative Integumentary Negative Musculoskeletal Negative Neurological Negative Psychiatry Negative Respiratory  Social Former smoker   Medication Prednisolone acetate 1%, Moxifloxacin, Ilevro,  Albuterol, Symbicort Inhalant Product, Alendronate, Aspirin, Baclofen, Nasalcrom, Duloxetine, Ferrous sulfate, Fish Oil, Fluticasone, Multivitamin, Nitroglycerin, Pantoprazole, Potassium chloride, Vitamin D3  Sx/Procedures Phaco c IOL OD,  Heart sx pacemaker, Stomach and bowel sx, Gallbladder Sx, Hip Replacement  Drug Allergies  Codeine  History & Physical: Heent: PCL OD, cataract OS NECK: supple without bruits LUNGS: lungs clear to auscultation CV: regular rate and rhythm Abdomen: soft and non-tender  Impression & Plan: Assessment: 1.  CATARACT EXTRACTION STATUS; Right Eye (Z98.41) 2.  COMBINED FORMS AGE RELATED CATARACT; Left Eye  (H25.812) 3.  INTRAOCULAR LENS IOL ; Right Eye (Z96.1)  Plan: 1.  3 days after cataract surgery. Doing well with improved vision and normal eye pressure. Call with any problems or concerns. Continue Pred-Moxi-Brom 3x/day for 4 more days and then 2x/day for 3 more weeks.  2.  Cataract accounts for the patient's decreased vision. This visual impairment is not correctable with a tolerable change in glasses or contact lenses. Cataract surgery with an implantation of a new lens should significantly improve the visual and functional status of the patient. Discussed all risks, benefits, alternatives, and potential complications. Discussed the procedures and recovery. Patient desires to have surgery. A-scan ordered and performed today for intra-ocular lens calculations. The surgery will be performed in order to improve vision for driving, reading, and for eye examinations. Recommend phacoemulsification with intra-ocular lens. Recommend Dextenza for post-operative pain and inflammation. Left Eye. Surgery required to correct imbalance of vision. Dilates well - shugarcaine by protocol.  3.  Doing well since surgery Continue Post-op medications

## 2023-04-08 NOTE — Pre-Procedure Instructions (Signed)
 Attempted pre-op phonecall. Left VM for her to call us back. 

## 2023-04-09 ENCOUNTER — Ambulatory Visit (INDEPENDENT_AMBULATORY_CARE_PROVIDER_SITE_OTHER): Payer: Medicare HMO

## 2023-04-09 DIAGNOSIS — E538 Deficiency of other specified B group vitamins: Secondary | ICD-10-CM | POA: Diagnosis not present

## 2023-04-09 MED ORDER — CYANOCOBALAMIN 1000 MCG/ML IJ SOLN
1000.0000 ug | INTRAMUSCULAR | Status: DC
Start: 2023-04-09 — End: 2023-08-14
  Administered 2023-04-09 – 2023-08-13 (×5): 1000 ug via INTRAMUSCULAR

## 2023-04-09 NOTE — Progress Notes (Signed)
B12 INJECTION RIGHT DELTOID - PATIENT TOLERATED WELL

## 2023-04-09 NOTE — Pre-Procedure Instructions (Signed)
Attempted 2 more phone call on 04/08/2023 and the phone rang with no answer. I finally got the patient today. She states she was riding, "the [place] home". I then asked her who was riding the [place] with her and she states, "no one. I don't have anyone." I sked her who brought her a few week ago for her 1st eye, she stated her "grandson did but he's working tomorrow". I explained that we had to have a responsible person with her to make sure she got home okay and she states, "well my nurse will put me on the [place] [facility] and someone here at th apartments will get me off'." I again explained that we could not let her ride the [place] by herself and why. She states, well I will have someone ride with me. Went over preop information with her and she verbalized understanding.

## 2023-04-15 NOTE — Progress Notes (Signed)
Remote pacemaker transmission.   

## 2023-04-24 ENCOUNTER — Ambulatory Visit (INDEPENDENT_AMBULATORY_CARE_PROVIDER_SITE_OTHER): Payer: Medicare HMO | Admitting: Family

## 2023-04-24 ENCOUNTER — Encounter: Payer: Self-pay | Admitting: Family

## 2023-04-24 VITALS — BP 122/82 | HR 78 | Temp 97.7°F | Ht 64.0 in | Wt 133.4 lb

## 2023-04-24 DIAGNOSIS — I5032 Chronic diastolic (congestive) heart failure: Secondary | ICD-10-CM | POA: Diagnosis not present

## 2023-04-24 DIAGNOSIS — I959 Hypotension, unspecified: Secondary | ICD-10-CM | POA: Diagnosis not present

## 2023-04-24 DIAGNOSIS — G6289 Other specified polyneuropathies: Secondary | ICD-10-CM | POA: Diagnosis not present

## 2023-04-24 DIAGNOSIS — N3000 Acute cystitis without hematuria: Secondary | ICD-10-CM

## 2023-04-24 DIAGNOSIS — R399 Unspecified symptoms and signs involving the genitourinary system: Secondary | ICD-10-CM | POA: Diagnosis not present

## 2023-04-24 LAB — URINALYSIS, COMPLETE
Bilirubin, UA: NEGATIVE
Glucose, UA: NEGATIVE
Ketones, UA: NEGATIVE
Nitrite, UA: POSITIVE — AB
Protein,UA: NEGATIVE
RBC, UA: NEGATIVE
Specific Gravity, UA: 1.015 (ref 1.005–1.030)
Urobilinogen, Ur: 0.2 mg/dL (ref 0.2–1.0)
pH, UA: 6.5 (ref 5.0–7.5)

## 2023-04-24 LAB — MICROSCOPIC EXAMINATION
RBC, Urine: NONE SEEN /hpf (ref 0–2)
Renal Epithel, UA: NONE SEEN /hpf
WBC, UA: >30 /hpf — AB (ref 0–5)

## 2023-04-24 MED ORDER — CEPHALEXIN 500 MG PO CAPS
500.0000 mg | ORAL_CAPSULE | Freq: Two times a day (BID) | ORAL | 0 refills | Status: DC
Start: 2023-04-24 — End: 2023-06-02

## 2023-04-24 NOTE — Progress Notes (Signed)
Subjective:    Patient ID: Sabrina [name], female    DOB: 17-Mar-1946, 77 y.o.   MRN: [id number]  Chief Complaint  Patient presents with   Hypotension    1 mth follow up    Urinary Tract Infection    Urine smells bad and dark    PT presents to the office today recheck hypotension. She was seen on 03/24/23 and we decreased her lasix to 20 mg from 40 mg. We also stopped the baclofen.   We increase her gabapentin to 300 mg in the evening from 100 mg for peripheral neuropathy.  Urinary Frequency  This is a new problem. The current episode started 1 to 4 weeks ago. The problem occurs intermittently. The problem has been gradually worsening. The pain is at a severity of 0/10. The patient is experiencing no pain. Associated symptoms include frequency, hesitancy and urgency. Pertinent negatives include no hematuria, nausea or vomiting. Associated symptoms comments: Foul odor. She has tried increased fluids for the symptoms. The treatment provided mild relief.  Congestive Heart Failure Presents for follow-up visit. Pertinent negatives include no edema or fatigue. The symptoms have been stable.      Review of Systems  Constitutional:  Negative for fatigue.  Gastrointestinal:  Negative for nausea and vomiting.  Genitourinary:  Positive for frequency, hesitancy and urgency. Negative for hematuria.  All other systems reviewed and are negative.      Objective:   Physical Exam Vitals reviewed.  Constitutional:      General: She is not in acute distress.    Appearance: She is well-developed.  HENT:     Head: Normocephalic and atraumatic.     Right Ear: Tympanic membrane normal.     Left Ear: Tympanic membrane normal.  Eyes:     Pupils: Pupils are equal, round, and reactive to light.  Neck:     Thyroid: No thyromegaly.  Cardiovascular:     Rate and Rhythm: Normal rate and regular rhythm.     Heart sounds: Normal heart sounds. No murmur heard. Pulmonary:     Effort: Pulmonary  effort is normal. No respiratory distress.     Breath sounds: Normal breath sounds. No wheezing.  Abdominal:     General: Bowel sounds are normal. There is no distension.     Palpations: Abdomen is soft.     Tenderness: There is no abdominal tenderness.  Musculoskeletal:        General: No tenderness. Normal range of motion.     Cervical back: Normal range of motion and neck supple.  Skin:    General: Skin is warm and dry.  Neurological:     Mental Status: She is alert and oriented to person, place, and time.     Cranial Nerves: No cranial nerve deficit.     Deep Tendon Reflexes: Reflexes are normal and symmetric.  Psychiatric:        Behavior: Behavior normal.        Thought Content: Thought content normal.        Judgment: Judgment normal.       BP 122/82   Pulse 78   Temp 97.7 F (36.5 C) (Temporal)   Ht 5\' 4"  (1.626 m)   Wt 133 lb 6.4 oz (60.5 kg)   SpO2 96%   BMI 22.90 kg/m      Assessment & Plan:  Sabrina [name] comes in today with chief complaint of Hypotension (1 mth follow up ) and Urinary Tract Infection (Urine smells bad  and dark )   Diagnosis and orders addressed:  1. UTI symptoms - Urinalysis, Complete - Urine Culture - CMP14+EGFR  2. Hypotension, unspecified hypotension type Resolved after decreasing lasix - CMP14+EGFR  3. Chronic diastolic heart failure (HCC) Stable Increase lasix if >3 lb in one day or 5lb in a week  - CMP14+EGFR  4. Other polyneuropathy - CMP14+EGFR  5. Acute cystitis without hematuria Force fluids AZO over the counter X2 days RTO prn Culture pending - cephALEXin (KEFLEX) 500 MG capsule; Take 1 capsule (500 mg total) by mouth 2 (two) times daily.  Dispense: 14 capsule; Refill: 0 - CMP14+EGFR   Labs pending Health Maintenance reviewed Diet and exercise encouraged  Follow up plan: 3 months   Jannifer Rodney, FNP

## 2023-04-24 NOTE — Patient Instructions (Signed)
Urinary Tract Infection, Adult  A urinary tract infection (UTI) is an infection of any part of the urinary tract. The urinary tract includes the kidneys, ureters, bladder, and urethra. These organs make, store, and get rid of urine in the body. An upper UTI affects the ureters and kidneys. A lower UTI affects the bladder and urethra. What are the causes? Most urinary tract infections are caused by bacteria in your genital area around your urethra, where urine leaves your body. These bacteria grow and cause inflammation of your urinary tract. What increases the risk? You are more likely to develop this condition if: You have a urinary catheter that stays in place. You are not able to control when you urinate or have a bowel movement (incontinence). You are female and you: Use a spermicide or diaphragm for birth control. Have low estrogen levels. Are pregnant. You have certain genes that increase your risk. You are sexually active. You take antibiotic medicines. You have a condition that causes your flow of urine to slow down, such as: An enlarged prostate, if you are female. Blockage in your urethra. A kidney stone. A nerve condition that affects your bladder control (neurogenic bladder). Not getting enough to drink, or not urinating often. You have certain medical conditions, such as: Diabetes. A weak disease-fighting system (immunesystem). Sickle cell disease. Gout. Spinal cord injury. What are the signs or symptoms? Symptoms of this condition include: Needing to urinate right away (urgency). Frequent urination. This may include small amounts of urine each time you urinate. Pain or burning with urination. Blood in the urine. Urine that smells bad or unusual. Trouble urinating. Cloudy urine. Vaginal discharge, if you are female. Pain in the abdomen or the lower back. You may also have: Vomiting or a decreased appetite. Confusion. Irritability or tiredness. A fever or  chills. Diarrhea. The first symptom in older adults may be confusion. In some cases, they may not have any symptoms until the infection has worsened. How is this diagnosed? This condition is diagnosed based on your medical history and a physical exam. You may also have other tests, including: Urine tests. Blood tests. Tests for STIs (sexually transmitted infections). If you have had more than one UTI, a cystoscopy or imaging studies may be done to determine the cause of the infections. How is this treated? Treatment for this condition includes: Antibiotic medicine. Over-the-counter medicines to treat discomfort. Drinking enough water to stay hydrated. If you have frequent infections or have other conditions such as a kidney stone, you may need to see a health care provider who specializes in the urinary tract (urologist). In rare cases, urinary tract infections can cause sepsis. Sepsis is a life-threatening condition that occurs when the body responds to an infection. Sepsis is treated in the hospital with IV antibiotics, fluids, and other medicines. Follow these instructions at home:  Medicines Take over-the-counter and prescription medicines only as told by your health care provider. If you were prescribed an antibiotic medicine, take it as told by your health care provider. Do not stop using the antibiotic even if you start to feel better. General instructions Make sure you: Empty your bladder often and completely. Do not hold urine for long periods of time. Empty your bladder after sex. Wipe from front to back after urinating or having a bowel movement if you are female. Use each tissue only one time when you wipe. Drink enough fluid to keep your urine pale yellow. Keep all follow-up visits. This is important. Contact a health   care provider if: Your symptoms do not get better after 1-2 days. Your symptoms go away and then return. Get help right away if: You have severe pain in  your back or your lower abdomen. You have a fever or chills. You have nausea or vomiting. Summary A urinary tract infection (UTI) is an infection of any part of the urinary tract, which includes the kidneys, ureters, bladder, and urethra. Most urinary tract infections are caused by bacteria in your genital area. Treatment for this condition often includes antibiotic medicines. If you were prescribed an antibiotic medicine, take it as told by your health care provider. Do not stop using the antibiotic even if you start to feel better. Keep all follow-up visits. This is important. This information is not intended to replace advice given to you by your health care provider. Make sure you discuss any questions you have with your health care provider. Document Revised: 04/15/2020 Document Reviewed: 04/20/2020 Elsevier Patient Education  2024 [employer]  

## 2023-04-30 DIAGNOSIS — H25812 Combined forms of age-related cataract, left eye: Secondary | ICD-10-CM | POA: Diagnosis not present

## 2023-05-01 ENCOUNTER — Ambulatory Visit: Payer: Medicare HMO | Attending: Cardiology | Admitting: Cardiology

## 2023-05-01 ENCOUNTER — Encounter: Payer: Self-pay | Admitting: Cardiology

## 2023-05-01 ENCOUNTER — Other Ambulatory Visit: Payer: Self-pay | Admitting: Family

## 2023-05-01 VITALS — BP 106/64 | HR 76 | Ht 64.0 in | Wt 131.0 lb

## 2023-05-01 DIAGNOSIS — I2510 Atherosclerotic heart disease of native coronary artery without angina pectoris: Secondary | ICD-10-CM

## 2023-05-01 DIAGNOSIS — I4891 Unspecified atrial fibrillation: Secondary | ICD-10-CM

## 2023-05-01 DIAGNOSIS — I5032 Chronic diastolic (congestive) heart failure: Secondary | ICD-10-CM | POA: Diagnosis not present

## 2023-05-01 MED ORDER — SYMBICORT 80-4.5 MCG/ACT IN AERO: 2.0000 | INHALATION_SPRAY | Freq: Two times a day (BID) | RESPIRATORY_TRACT | 2 refills | Status: DC

## 2023-05-01 MED ORDER — ATORVASTATIN CALCIUM 40 MG PO TABS: 40.0000 mg | ORAL_TABLET | Freq: Every day | ORAL | 3 refills | Status: DC

## 2023-05-01 NOTE — Patient Instructions (Signed)
Medication Instructions:  INCREASE Atorvastatin to 40 mg daily  Labwork: None today  Testing/Procedures: None today  Follow-Up: 6 months  Any Other Special Instructions Will Be Listed Below (If Applicable).  If you need a refill on your cardiac medications before your next appointment, please call your pharmacy.

## 2023-05-01 NOTE — Progress Notes (Signed)
Clinical Summary Sabrina [name] is a 77 y.o.female seen today for follow up of the following medical problems.    1.Persistent afib/pacemaker - s/p av nodal ablation and pacemaker - Gi bleeding issues on anticoag, now with watchman device.      - no palpitations 03/2023 normal device check   2. LE edema/chronic diastolic HF 05/2016 60-65%, no WMAs, mild MR, normal RV  -edema controlled. Pcp recently cut back lasix due to low bp's and AKI that have resolved   3. SOB - some recent SOB few months with activity. Exampe walking long distances or upstairs.  - no cough or wheezing - chronic LE edema unchanged.  - no chest pain - ran out of her symbicort.      4.Chest pain/CAD - 01/2022 cath nonobstructive disease - no  recent chest pains.       5. Hyperlipidemia 03/2023 TC 197 TG 120 HDL 62 LDL 114      Doing silver sneakers at the gym Past Medical History:  Diagnosis Date   Anemia    Ankle fracture, right    past hx. -"no surgery"   Anxiety    Asthma    CHF (congestive heart failure) (HCC) 2009   Chronic lower back pain    Collagen vascular disease (HCC)    COPD (chronic obstructive pulmonary disease) (HCC)    Depression    Fibromyalgia    GERD (gastroesophageal reflux disease)    Hyperlipidemia    Hypertension    Immature cataract of both eyes    Myocardial infarction (HCC)    "I've had a light one; don't know when it happened" (08/27/2017)   Osteoarthritis    Peripheral neuropathy    legs and feet   Persistent atrial fibrillation (HCC)    Presence of Watchman left atrial appendage closure device 08/01/2021   24 mm Watchman FLXDevice LOT # [id number] by Dr. Lalla Brothers   Tremors of nervous system    noted in hands by pt last 6 months   Tubular adenoma of colon      Allergies  Allergen Reactions   Codeine Nausea And Vomiting    Stomach pain also     Current Outpatient Medications  Medication Sig Dispense Refill   albuterol (VENTOLIN HFA) 108 (90  Base) MCG/ACT inhaler INHALE 2 PUFFS BY MOUTH EVERY 6 HOURS AS NEEDED FOR WHEEZE OR SHORTNESS OF BREATH 1 each 1   aspirin EC 81 MG EC tablet Take 1 tablet (81 mg total) by mouth daily. Swallow whole. 30 tablet 11   atorvastatin (LIPITOR) 20 MG tablet Take 1 tablet (20 mg total) by mouth daily. 90 tablet 2   blood glucose meter kit and supplies Dispense based on patient and insurance preference. Use up to four times daily as directed. (FOR ICD-10 E10.9, E11.9). 1 each 0   Blood Glucose Monitoring Suppl (TRUE METRIX AIR GLUCOSE METER) w/Device KIT Test BS daily Dx R73.09 1 kit 0   cephALEXin (KEFLEX) 500 MG capsule Take 1 capsule (500 mg total) by mouth 2 (two) times daily. 14 capsule 0   cromolyn (NASALCROM) 5.2 MG/ACT nasal spray Place 1 spray into both nostrils 2 (two) times daily as needed for allergies.     DULoxetine (CYMBALTA) 60 MG capsule TAKE 1 CAPSULE EVERY DAY (NEEDS TO BE SEEN BEFORE NEXT REFILL) 90 capsule 0   ferrous sulfate 325 (65 FE) MG tablet Take 325 mg by mouth once a week.     fluticasone (FLONASE) 50 MCG/ACT  nasal spray USE 2 SPRAYS IN EACH NOSTRIL EVERY DAY 48 g 0   furosemide (LASIX) 20 MG tablet Take 1 tablet (20 mg total) by mouth daily. 90 tablet 1   gabapentin (NEURONTIN) 100 MG capsule 100 mg in AM and 300 mg in evening 360 capsule 1   glucose blood (TRUE METRIX BLOOD GLUCOSE TEST) test strip Test BS daily Dx R73.09 100 each 3   ILEVRO 0.3 % ophthalmic suspension Place 1 drop into the right eye daily.     moxifloxacin (VIGAMOX) 0.5 % ophthalmic solution Place 1 drop into the right eye 3 (three) times daily.     Multiple Vitamin (MULTIVITAMIN WITH MINERALS) TABS tablet Take 1 tablet by mouth in the morning.     nitroGLYCERIN (NITROSTAT) 0.4 MG SL tablet Place 1 tablet (0.4 mg total) under the tongue every 5 (five) minutes x 3 doses as needed for chest pain. 25 tablet 12   Omega-3 Fatty Acids (FISH OIL PO) Take 1 capsule by mouth daily.     pantoprazole (PROTONIX) 40 MG  tablet Take 1 tablet (40 mg total) by mouth 2 (two) times daily. 180 tablet 1   potassium chloride SA (KLOR-CON M) 20 MEQ tablet Take 1 tablet (20 mEq total) by mouth 2 (two) times daily. Take 1 tablet twice a day 180 tablet 1   prednisoLONE acetate (PRED FORTE) 1 % ophthalmic suspension Place 1 drop into the right eye 3 (three) times daily.     SYMBICORT 80-4.5 MCG/ACT inhaler Inhale 2 puffs into the lungs 2 (two) times daily. Take Two puffs into the lungs in the morning and 2 puff at bedtime 1 each 2   triamcinolone ointment (KENALOG) 0.5 % Apply 1 application topically 2 (two) times daily. 30 g 0   TRUEplus Lancets 33G MISC Test BS daily Dx R73.09 100 each 3   vitamin C (ASCORBIC ACID) 500 MG tablet Take 500 mg by mouth daily.     Vitamin D, Ergocalciferol, (DRISDOL) 1.25 MG (50000 UNIT) CAPS capsule TAKE ONE CAPSULE (50,000 UNITS DOSE) BY MOUTH ONCE A WEEK. WEDNESDAY 12 capsule 1   Current Facility-Administered Medications  Medication Dose Route Frequency Provider Last Rate Last Admin   cyanocobalamin (VITAMIN B12) injection 1,000 mcg  1,000 mcg Intramuscular Q30 days Junie Spencer, FNP   1,000 mcg at 04/09/23 1421     Past Surgical History:  Procedure Laterality Date   APPENDECTOMY     AV NODE ABLATION N/A 08/27/2017   Procedure: AV NODE ABLATION;  Surgeon: [name], MD;  Location: [facility] INVASIVE CV LAB;  Service: Cardiovascular;  Laterality: N/A;   CATARACT EXTRACTION W/PHACO Right 03/16/2023   Procedure: CATARACT EXTRACTION PHACO AND INTRAOCULAR LENS PLACEMENT (IOC);  Surgeon: [name], MD;  Location: [facility] ORS;  Service: Ophthalmology;  Laterality: Right;  CDE: 10.62   CHOLECYSTECTOMY OPEN  1978   DILATION AND CURETTAGE OF UTERUS     FEMUR FRACTURE SURGERY Left 2013   "put 7" rod in it"   FRACTURE SURGERY     IVC FILTER REMOVAL N/A 06/20/2021   Procedure: IVC FILTER REMOVAL;  Surgeon: Cephus Shelling, MD;  Location: [facility] INVASIVE CV LAB;  Service: Cardiovascular;   Laterality: N/A;   LAPAROSCOPY  08/22/2016   Procedure: LAPAROSCOPY DIAGNOSTIC;  Surgeon: Romie Levee, MD;  Location: [facility] ORS;  Service: General;;   LEFT ATRIAL APPENDAGE OCCLUSION N/A 08/01/2021   Procedure: LEFT ATRIAL APPENDAGE OCCLUSION;  Surgeon: Lanier Prude, MD;  Location: [facility] INVASIVE CV LAB;  Service: Cardiovascular;  Laterality: N/A;   LEFT HEART CATH AND CORONARY ANGIOGRAPHY N/A 02/06/2022   Procedure: LEFT HEART CATH AND CORONARY ANGIOGRAPHY;  Surgeon: Lennette Bihari, MD;  Location: [facility] INVASIVE CV LAB;  Service: Cardiovascular;  Laterality: N/A;   MEDIAL PARTIAL KNEE REPLACEMENT Right 2005   "@ [facility]"   PACEMAKER IMPLANT N/A 08/27/2017   Procedure: PACEMAKER IMPLANT;  Surgeon: [name], MD;  Location: [facility] INVASIVE CV LAB;  Service: Cardiovascular;  Laterality: N/A;   ROUX-EN-Y GASTRIC BYPASS  2002   [facility]   SPLENECTOMY  2002   TEE WITHOUT CARDIOVERSION N/A 08/01/2021   Procedure: TRANSESOPHAGEAL ECHOCARDIOGRAM (TEE);  Surgeon: Lanier Prude, MD;  Location: [facility] INVASIVE CV LAB;  Service: Cardiovascular;  Laterality: N/A;   TONSILLECTOMY  1944   TUBAL LIGATION     VAGINAL HYSTERECTOMY       Allergies  Allergen Reactions   Codeine Nausea And Vomiting    Stomach pain also      Family History  Problem Relation Age of Onset   Cancer Mother    Alzheimer's disease Mother    Heart disease Mother    Arthritis Father    Asthma Daughter    Arthritis Daughter    Obesity Daughter    Colon cancer Paternal Uncle 54   Arthritis Son    Hyperlipidemia Son    Obesity Son    Arthritis Son    Obesity Son    BRCA 1/2 Neg Hx      Social History Sabrina [name] reports that she quit smoking about 30 years ago. Her smoking use included cigarettes. She started smoking about 55 years ago. She has a 12.5 pack-year smoking history. She has never used smokeless tobacco. Sabrina [name] reports that she does not currently use alcohol.   Review of  Systems CONSTITUTIONAL: No weight loss, fever, chills, weakness or fatigue.  HEENT: Eyes: No visual loss, blurred vision, double vision or yellow sclerae.No hearing loss, sneezing, congestion, runny nose or sore throat.  SKIN: No rash or itching.  CARDIOVASCULAR: per hpi RESPIRATORY: No shortness of breath, cough or sputum.  GASTROINTESTINAL: No anorexia, nausea, vomiting or diarrhea. No abdominal pain or blood.  GENITOURINARY: No burning on urination, no polyuria NEUROLOGICAL: No headache, dizziness, syncope, paralysis, ataxia, numbness or tingling in the extremities. No change in bowel or bladder control.  MUSCULOSKELETAL: No muscle, back pain, joint pain or stiffness.  LYMPHATICS: No enlarged nodes. No history of splenectomy.  PSYCHIATRIC: No history of depression or anxiety.  ENDOCRINOLOGIC: No reports of sweating, cold or heat intolerance. No polyuria or polydipsia.  Marland Kitchen   Physical Examination Today's Vitals   05/01/23 1412  BP: 106/64  Pulse: 76  SpO2: 98%  Weight: 131 lb (59.4 kg)  Height: 5\' 4"  (1.626 m)   Body mass index is 22.49 kg/m.  Gen: resting comfortably, no acute distress HEENT: no scleral icterus, pupils equal round and reactive, no palptable cervical adenopathy,  CV: RRR, no m/rg, no jvd Resp: Clear to auscultation bilaterally GI: abdomen is soft, non-tender, non-distended, normal bowel sounds, no hepatosplenomegaly MSK: extremities are warm, no edema.  Skin: warm, no rash Neuro:  no focal deficits Psych: appropriate affect   Diagnostic Studies 05/2016 echo Study Conclusions   - Left ventricle: The cavity size was normal. There was mild    concentric hypertrophy. Systolic function was normal. The    estimated ejection fraction was in the range of 60% to 65%. Wall    motion  was normal; there were no regional wall motion    abnormalities. There was no evidence of elevated ventricular    filling pressure by Doppler parameters.  - Aortic valve: Trileaflet;  normal thickness leaflets. There was no    regurgitation.  - Mitral valve: Structurally normal valve. There was mild    regurgitation.  - Left atrium: The atrium was severely dilated.  - Right ventricle: The cavity size was normal. Wall thickness was    normal. Systolic function was normal.  - Right atrium: The atrium was normal in size.  - Tricuspid valve: There was mild regurgitation.  - Pulmonic valve: There was no regurgitation.  - Pulmonary arteries: Systolic pressure was within the normal    range.  - Inferior vena cava: The vessel was normal in size.  - Pericardium, extracardiac: There was no pericardial effusion.        05/2016 nuclear stress There was no ST segment deviation noted during stress. The study is normal. This is a low risk study. The left ventricular ejection fraction is hyperdynamic (>65%).   Normal pharmacologic stress test with no evidence of scar or ischemia.    01/2022 cath   Prox LAD lesion is 20% stenosed.   Mid LAD-1 lesion is 20% stenosed.   Mid LAD-2 lesion is 20% stenosed.   1st Mrg lesion is 20% stenosed.   Prox RCA lesion is 20% stenosed.   The left ventricular systolic function is normal.   LV end diastolic pressure is normal.   The left ventricular ejection fraction is 55-65% by visual estimate.   Three-vessel coronary calcification with mild nonobstructive CAD with a dominant RCA.   Normal LV function with EF estimated at 55 to 65%; LVEDP 12 mmHg.   RECOMMENDATION: Medical therapy for multivessel nonobstructive CAD.  Aspirin for antiplatelet benefit.  Aggressive lipid-lowering therapy with target LDL less than 70.   01/2022 echo    1. Left ventricular ejection fraction, by estimation, is 60 to 65%. The  left ventricle has normal function. The left ventricle has no regional  wall motion abnormalities. Left ventricular diastolic parameters are  indeterminate.   2. Pacing wires in RA/RV . Right ventricular systolic function is normal.   The right ventricular size is normal. There is normal pulmonary artery  systolic pressure.   3. Left atrial size was moderately dilated.   4. Right atrial size was mildly dilated.   5. The mitral valve is abnormal. Mild mitral valve regurgitation. No  evidence of mitral stenosis.   6. The aortic valve is tricuspid. There is mild calcification of the  aortic valve. Aortic valve regurgitation is mild. Aortic valve sclerosis  is present, with no evidence of aortic valve stenosis.   7. The inferior vena cava is normal in size with greater than 50%  respiratory variability, suggesting right atrial pressure of 3 mmHg.     Assessment and Plan    1.Afib/pacemaker - she is s/p av nodal ablation with pacemaker  - has watchman device, prior GI bleeding issues on anticoagulation - no recent symptoms, continue current meds  EKG today shows V paced rhythm   2. LE edema -doing well on lasix, continue current meds   3. CAD - mild disease by cath - no recent symptoms, continue current meds   4. SOB - recent SOB corresponding to running out of her symbicort. She will work with pcp to get restarted   5. Hyperlipidemia - above goal, increase atorvastatin to 40mg  daily.  Antoine Poche, M.D.

## 2023-05-06 ENCOUNTER — Other Ambulatory Visit: Payer: Self-pay

## 2023-05-06 ENCOUNTER — Encounter (HOSPITAL_COMMUNITY): Payer: Self-pay

## 2023-05-06 ENCOUNTER — Encounter (HOSPITAL_COMMUNITY)
Admission: RE | Admit: 2023-05-06 | Discharge: 2023-05-06 | Disposition: A | Discharge status: Home / Self Care | Payer: Medicare HMO | Source: Ambulatory Visit | Attending: Ophthalmology | Admitting: Ophthalmology

## 2023-05-07 NOTE — H&P (Signed)
Surgical History & Physical  Patient Name: Sabrina [name]  DOB: 07/19/1946  Surgery: Cataract extraction with intraocular lens implant phacoemulsification; Left Eye Surgeon: [name] MD Surgery Date: 05/08/2023 Pre-Op Date: 03/19/2023  HPI: A 62 Yr. old female patient present for 3 day post op OD. Patient states eye has been sore off and on since sx, not bad. Vision is improving every day. Difficulties recognizing peoples faces from a distance, seeing captions on tv, reading fine print, daily activities with OS. This is negatively affecting the patient's quality of life and the patient is unable to function adequately in life with the current level of vision. Patient would like to proceed with cataract sx OS.  Medical History: Cataracts  Diabetes - DM Type 2 Heart Problem Lung Problems  Review of Systems Negative Allergic/Immunologic Heart problems Cardiovascular Negative Constitutional Negative Ear, Nose, Mouth & Throat Diabetic Endocrine Cataract Eyes Negative Gastrointestinal Negative Genitourinary Negative Hemotologic/Lymphatic Negative Integumentary Negative Musculoskeletal Negative Neurological Negative Psychiatry Lung problems Respiratory  Social Former smoker  Medication Prednisolone acetate 1%, Moxifloxacin, Ilevro,  Albuterol, Symbicort Inhalant Product, Alendronate, Aspirin, Baclofen, Nasalcrom, Duloxetine, Ferrous sulfate, Fish Oil, Fluticasone, Multivitamin, Nitroglycerin, Pantoprazole, Potassium chloride, Vitamin D3  Sx/Procedures Phaco c IOL OD,  Heart sx pacemaker, Stomach and bowel sx, Gallbladder Sx, Hip Replacement  Drug Allergies  Codeine  History & Physical: Heent: cataract OS, PCL OD NECK: supple without bruits LUNGS: lungs clear to auscultation CV: regular rate and rhythm Abdomen: soft and non-tender  Impression & Plan: Assessment: 1.  CATARACT EXTRACTION STATUS; Right Eye (Z98.41) 2.  COMBINED FORMS AGE RELATED CATARACT; Left Eye  (H25.812) 3.  INTRAOCULAR LENS IOL ; Right Eye (Z96.1)  Plan: 1.  3 days after cataract surgery. Doing well with improved vision and normal eye pressure. Call with any problems or concerns. Continue Pred-Moxi-Brom 3x/day for 4 more days and then 2x/day for 3 more weeks.  2.  Cataract accounts for the patient's decreased vision. This visual impairment is not correctable with a tolerable change in glasses or contact lenses. Cataract surgery with an implantation of a new lens should significantly improve the visual and functional status of the patient. Discussed all risks, benefits, alternatives, and potential complications. Discussed the procedures and recovery. Patient desires to have surgery. A-scan ordered and performed today for intra-ocular lens calculations. The surgery will be performed in order to improve vision for driving, reading, and for eye examinations. Recommend phacoemulsification with intra-ocular lens. Recommend Dextenza for post-operative pain and inflammation. Left Eye. Surgery required to correct imbalance of vision. Dilates well - shugarcaine by protocol.  3.  Doing well since surgery Continue Post-op medications

## 2023-05-08 ENCOUNTER — Ambulatory Visit (HOSPITAL_COMMUNITY)
Admission: RE | Admit: 2023-05-08 | Discharge: 2023-05-08 | Disposition: A | Discharge status: Home / Self Care | Payer: Medicare HMO | Source: Home / Self Care | Attending: Ophthalmology | Admitting: Ophthalmology

## 2023-05-08 ENCOUNTER — Ambulatory Visit (HOSPITAL_COMMUNITY): Payer: Medicare HMO | Admitting: Anesthesiology

## 2023-05-08 ENCOUNTER — Encounter (HOSPITAL_COMMUNITY): Payer: Self-pay | Admitting: Ophthalmology

## 2023-05-08 ENCOUNTER — Encounter (HOSPITAL_COMMUNITY)
Admission: RE | Disposition: A | Discharge status: Home / Self Care | Payer: Self-pay | Source: Home / Self Care | Attending: Ophthalmology

## 2023-05-08 DIAGNOSIS — H25812 Combined forms of age-related cataract, left eye: Secondary | ICD-10-CM | POA: Insufficient documentation

## 2023-05-08 DIAGNOSIS — Z961 Presence of intraocular lens: Secondary | ICD-10-CM | POA: Insufficient documentation

## 2023-05-08 DIAGNOSIS — J449 Chronic obstructive pulmonary disease, unspecified: Secondary | ICD-10-CM | POA: Diagnosis not present

## 2023-05-08 DIAGNOSIS — I110 Hypertensive heart disease with heart failure: Secondary | ICD-10-CM | POA: Diagnosis not present

## 2023-05-08 DIAGNOSIS — E1136 Type 2 diabetes mellitus with diabetic cataract: Secondary | ICD-10-CM | POA: Insufficient documentation

## 2023-05-08 DIAGNOSIS — Z87891 Personal history of nicotine dependence: Secondary | ICD-10-CM

## 2023-05-08 DIAGNOSIS — Z9841 Cataract extraction status, right eye: Secondary | ICD-10-CM | POA: Diagnosis not present

## 2023-05-08 DIAGNOSIS — I5032 Chronic diastolic (congestive) heart failure: Secondary | ICD-10-CM | POA: Diagnosis not present

## 2023-05-08 HISTORY — PX: CATARACT EXTRACTION W/PHACO: SHX586

## 2023-05-08 LAB — GLUCOSE, CAPILLARY: Glucose-Capillary: 81 mg/dL (ref 70–99)

## 2023-05-08 SURGERY — PHACOEMULSIFICATION, CATARACT, WITH IOL INSERTION
Anesthesia: Monitor Anesthesia Care | Site: Eye | Laterality: Left

## 2023-05-08 MED ORDER — TETRACAINE HCL 0.5 % OP SOLN
1.0000 [drp] | OPHTHALMIC | Status: AC | PRN
  Administered 2023-05-08 (×3): 1 [drp] via OPHTHALMIC

## 2023-05-08 MED ORDER — LIDOCAINE HCL 3.5 % OP GEL
1.0000 | Freq: Once | OPHTHALMIC | Status: AC
  Administered 2023-05-08: 1 via OPHTHALMIC

## 2023-05-08 MED ORDER — STERILE WATER FOR IRRIGATION IR SOLN
Status: DC | PRN
  Administered 2023-05-08: 1

## 2023-05-08 MED ORDER — SODIUM HYALURONATE 23MG/ML IO SOSY
PREFILLED_SYRINGE | INTRAOCULAR | Status: DC | PRN
  Administered 2023-05-08: .6 mL via INTRAOCULAR

## 2023-05-08 MED ORDER — LIDOCAINE HCL (PF) 1 % IJ SOLN
INTRAOCULAR | Status: DC | PRN
  Administered 2023-05-08: 1 mL via OPHTHALMIC

## 2023-05-08 MED ORDER — EPINEPHRINE PF 1 MG/ML IJ SOLN
INTRAOCULAR | Status: DC | PRN
  Administered 2023-05-08: 500 mL

## 2023-05-08 MED ORDER — BSS IO SOLN
INTRAOCULAR | Status: DC | PRN
  Administered 2023-05-08: 15 mL via INTRAOCULAR

## 2023-05-08 MED ORDER — LACTATED RINGERS IV SOLN: INTRAVENOUS | Status: DC

## 2023-05-08 MED ORDER — PHENYLEPHRINE HCL 2.5 % OP SOLN
1.0000 [drp] | OPHTHALMIC | Status: DC | PRN
  Administered 2023-05-08 (×2): 1 [drp] via OPHTHALMIC

## 2023-05-08 MED ORDER — POVIDONE-IODINE 5 % OP SOLN
OPHTHALMIC | Status: DC | PRN
  Administered 2023-05-08: 1 via OPHTHALMIC

## 2023-05-08 MED ORDER — NEOMYCIN-POLYMYXIN-DEXAMETH 3.5-10000-0.1 OP SUSP
OPHTHALMIC | Status: DC | PRN
  Administered 2023-05-08: 2 [drp] via OPHTHALMIC

## 2023-05-08 MED ORDER — TROPICAMIDE 1 % OP SOLN
1.0000 [drp] | OPHTHALMIC | Status: DC | PRN
  Administered 2023-05-08 (×2): 1 [drp] via OPHTHALMIC

## 2023-05-08 MED ORDER — TETRACAINE HCL 0.5 % OP SOLN: 1.0000 [drp] | OPHTHALMIC | Status: DC | PRN

## 2023-05-08 MED ORDER — SODIUM HYALURONATE 10 MG/ML IO SOLUTION
PREFILLED_SYRINGE | INTRAOCULAR | Status: DC | PRN
  Administered 2023-05-08: .85 mL via INTRAOCULAR

## 2023-05-08 MED ORDER — TROPICAMIDE 1 % OP SOLN
1.0000 [drp] | OPHTHALMIC | Status: DC | PRN
  Administered 2023-05-08: 1 [drp] via OPHTHALMIC

## 2023-05-08 MED ORDER — PHENYLEPHRINE HCL 2.5 % OP SOLN
1.0000 [drp] | OPHTHALMIC | Status: DC | PRN
  Administered 2023-05-08: 1 [drp] via OPHTHALMIC

## 2023-05-08 SURGICAL SUPPLY — 15 items
CATARACT SUITE SIGHTPATH (MISCELLANEOUS) ×1 IMPLANT
CLOTH BEACON ORANGE TIMEOUT ST (SAFETY) ×1 IMPLANT
EYE SHIELD UNIVERSAL CLEAR (GAUZE/BANDAGES/DRESSINGS) IMPLANT
FEE CATARACT SUITE SIGHTPATH (MISCELLANEOUS) ×1 IMPLANT
GLOVE BIOGEL PI IND STRL 7.0 (GLOVE) ×2 IMPLANT
GLOVE SURG SS PI 7.5 STRL IVOR (GLOVE) IMPLANT
GOWN STRL REUS W/TWL LRG LVL3 (GOWN DISPOSABLE) IMPLANT
LENS IOL TECNIS EYHANCE 22.5 (Intraocular Lens) IMPLANT
NDL HYPO 18GX1.5 BLUNT FILL (NEEDLE) ×1 IMPLANT
NEEDLE HYPO 18GX1.5 BLUNT FILL (NEEDLE) ×1 IMPLANT
PAD ARMBOARD 7.5X6 YLW CONV (MISCELLANEOUS) ×1 IMPLANT
POSITIONER HEAD 8X9X4 ADT (SOFTGOODS) ×1 IMPLANT
SYR TB 1ML LL NO SAFETY (SYRINGE) ×1 IMPLANT
TAPE SURG TRANSPORE 1 IN (GAUZE/BANDAGES/DRESSINGS) IMPLANT
WATER STERILE IRR 250ML POUR (IV SOLUTION) ×1 IMPLANT

## 2023-05-08 NOTE — Interval H&P Note (Signed)
History and Physical Interval Note:  05/08/2023 9:48 AM  Sabrina [name]  has presented today for surgery, with the diagnosis of combined forms age related cataract, left eye.  The various methods of treatment have been discussed with the patient and family. After consideration of risks, benefits and other options for treatment, the patient has consented to  Procedure(s): CATARACT EXTRACTION PHACO AND INTRAOCULAR LENS PLACEMENT (IOC) (Left) as a surgical intervention.  The patient's history has been reviewed, patient examined, no change in status, stable for surgery.  I have reviewed the patient's chart and labs.  Questions were answered to the patient's satisfaction.     [name]

## 2023-05-08 NOTE — Discharge Instructions (Signed)
Please discharge patient when stable, will follow up today with Dr. Wrzosek at the [facility] office immediately following discharge.  Leave shield in place until visit.  All paperwork with discharge instructions will be given at the office.  [facility] Address:  [address]  [place], [place] [address]  

## 2023-05-08 NOTE — Anesthesia Postprocedure Evaluation (Signed)
Anesthesia Post Note  Patient: Sabrina [name]  Procedure(s) Performed: CATARACT EXTRACTION PHACO AND INTRAOCULAR LENS PLACEMENT (IOC) (Left: Eye)  Patient location during evaluation: Phase II Anesthesia Type: MAC Level of consciousness: awake and alert and oriented Pain management: pain level controlled Vital Signs Assessment: post-procedure vital signs reviewed and stable Respiratory status: spontaneous breathing, nonlabored ventilation and respiratory function stable Cardiovascular status: stable and blood pressure returned to baseline Postop Assessment: no apparent nausea or vomiting Anesthetic complications: no  No notable events documented.   Last Vitals:  Vitals:   05/08/23 0903 05/08/23 1010  BP: 104/66 112/66  Pulse: 70 71  Resp: 19 16  Temp: 36.5 C 36.4 C  SpO2: 100% 100%    Last Pain:  Vitals:   05/08/23 1010  TempSrc: Oral  PainSc: 0-No pain                 Zaniel Marineau C Jon Kasparek

## 2023-05-08 NOTE — Op Note (Signed)
Date of procedure: 05/08/23  Pre-operative diagnosis: Visually significant age-related combined cataract, Left Eye (H25.812)  Post-operative diagnosis: Visually significant age-related combined cataract, Left Eye (H25.812)  Procedure: Removal of cataract via phacoemulsification and insertion of intra-ocular lens Laural Benes and Johnson DIB00 +22.5D into the capsular bag of the Left Eye  Attending surgeon: Rudy Jew. Sharnese Heath, MD, MA  Anesthesia: MAC, Topical Akten  Complications: None  Estimated Blood Loss: <80mL (minimal)  Specimens: None  Implants: As above  Indications:  Visually significant age-related cataract, Left Eye  Procedure:  The patient was seen and identified in the pre-operative area. The operative eye was identified and dilated.  The operative eye was marked.  Topical anesthesia was administered to the operative eye.     The patient was then to the operative suite and placed in the supine position.  A timeout was performed confirming the patient, procedure to be performed, and all other relevant information.   The patient's face was prepped and draped in the usual fashion for intra-ocular surgery.  A lid speculum was placed into the operative eye and the surgical microscope moved into place and focused.  An inferotemporal paracentesis was created using a 20 gauge paracentesis blade.  Shugarcaine was injected into the anterior chamber.  Viscoelastic was injected into the anterior chamber.  A temporal clear-corneal main wound incision was created using a 2.33mm microkeratome.  A continuous curvilinear capsulorrhexis was initiated using an irrigating cystitome and completed using capsulorrhexis forceps.  Hydrodissection and hydrodeliniation were performed.  Viscoelastic was injected into the anterior chamber.  A phacoemulsification handpiece and a chopper as a second instrument were used to remove the nucleus and epinucleus. The irrigation/aspiration handpiece was used to remove any  remaining cortical material.   The capsular bag was reinflated with viscoelastic, checked, and found to be intact.  The intraocular lens was inserted into the capsular bag.  The irrigation/aspiration handpiece was used to remove any remaining viscoelastic.  The clear corneal wound and paracentesis wounds were then hydrated and checked with Weck-Cels to be watertight. Maxitrol drops were instilled into the operative eye. The lid-speculum was removed.  The drape was removed.  The patient's face was cleaned with a wet and dry 4x4.    A clear shield was taped over the eye. The patient was taken to the post-operative care unit in good condition, having tolerated the procedure well.  Post-Op Instructions: The patient will follow up at [facility] for a same day post-operative evaluation and will receive all other orders and instructions.

## 2023-05-08 NOTE — Transfer of Care (Signed)
Immediate Anesthesia Transfer of Care Note  Patient: Sabrina [name]  Procedure(s) Performed: CATARACT EXTRACTION PHACO AND INTRAOCULAR LENS PLACEMENT (IOC) (Left: Eye)  Patient Location: Short Stay  Anesthesia Type:MAC  Level of Consciousness: awake, alert , and oriented  Airway & Oxygen Therapy: Patient Spontanous Breathing  Post-op Assessment: Report given to RN and Post -op Vital signs reviewed and stable  Post vital signs: Reviewed and stable  Last Vitals:  Vitals Value Taken Time  BP 112/66 05/08/23 1010  Temp 36.4 C 05/08/23 1010  Pulse 71 05/08/23 1010  Resp 16 05/08/23 1010  SpO2 100 % 05/08/23 1010    Last Pain:  Vitals:   05/08/23 1010  TempSrc: Oral  PainSc: 0-No pain         Complications: No notable events documented.

## 2023-05-08 NOTE — Anesthesia Preprocedure Evaluation (Signed)
Anesthesia Evaluation  Patient identified by MRN, date of birth, ID band Patient awake    Reviewed: Allergy & Precautions, H&P , NPO status , Patient's Chart, lab work & pertinent test results  History of Anesthesia Complications Negative for: history of anesthetic complications  Airway Mallampati: II  TM Distance: >3 FB Neck ROM: Full    Dental  (+) Upper Dentures, Lower Dentures   Pulmonary asthma , COPD,  COPD inhaler, former smoker   Pulmonary exam normal breath sounds clear to auscultation       Cardiovascular Exercise Tolerance: Good hypertension, Pt. on medications + CAD, + Past MI, +CHF and + DVT  Normal cardiovascular exam+ dysrhythmias Atrial Fibrillation + pacemaker  Rhythm:Regular Rate:Normal  1. Left ventricular ejection fraction, by estimation, is 60 to 65%. The  left ventricle has normal function. The left ventricle has no regional  wall motion abnormalities. Left ventricular diastolic parameters are  indeterminate.   2. Pacing wires in RA/RV . Right ventricular systolic function is normal.  The right ventricular size is normal. There is normal pulmonary artery  systolic pressure.   3. Left atrial size was moderately dilated.   4. Right atrial size was mildly dilated.   5. The mitral valve is abnormal. Mild mitral valve regurgitation. No  evidence of mitral stenosis.   6. The aortic valve is tricuspid. There is mild calcification of the  aortic valve. Aortic valve regurgitation is mild. Aortic valve sclerosis  is present, with no evidence of aortic valve stenosis.   7. The inferior vena cava is normal in size with greater than 50%  respiratory variability, suggesting right atrial pressure of 3 mmHg.   01-May-2023 14:19:05 [facility]-CVD[facility] ROUTINE RECORD 06/29/1946 (76 yr) Female Caucasian Vent. rate 71 BPM PR interval  ms QRS duration 116 ms QT/QTcB 438/475 ms P-R-T axes  -1  138 Ventricular-paced rhythm When compared with ECG of 07-Feb-2022 08:40, No significant change was found Confirmed by Dina Rich [phone]) on 05/01/2023 2:47:52 PM    Neuro/Psych  PSYCHIATRIC DISORDERS Anxiety Depression     Neuromuscular disease    GI/Hepatic Neg liver ROS,GERD  Medicated and Controlled,,  Endo/Other  negative endocrine ROS    Renal/GU negative Renal ROS  negative genitourinary   Musculoskeletal  (+) Arthritis ,  Fibromyalgia -  Abdominal   Peds negative pediatric ROS (+)  Hematology  (+) Blood dyscrasia, anemia   Anesthesia Other Findings   Reproductive/Obstetrics negative OB ROS                             Anesthesia Physical Anesthesia Plan  ASA: 3  Anesthesia Plan: MAC   Post-op Pain Management: Minimal or no pain anticipated   Induction: Intravenous  PONV Risk Score and Plan: 0 and Treatment may vary due to age or medical condition  Airway Management Planned: Nasal Cannula and Natural Airway  Additional Equipment:   Intra-op Plan:   Post-operative Plan:   Informed Consent: I have reviewed the patients History and Physical, chart, labs and discussed the procedure including the risks, benefits and alternatives for the proposed anesthesia with the patient or authorized representative who has indicated his/her understanding and acceptance.     Dental advisory given  Plan Discussed with: CRNA and Surgeon  Anesthesia Plan Comments:         Anesthesia Quick Evaluation

## 2023-05-11 ENCOUNTER — Encounter (HOSPITAL_COMMUNITY): Payer: Self-pay | Admitting: Ophthalmology

## 2023-05-11 ENCOUNTER — Ambulatory Visit: Payer: Medicare HMO

## 2023-05-12 ENCOUNTER — Ambulatory Visit (INDEPENDENT_AMBULATORY_CARE_PROVIDER_SITE_OTHER): Payer: Medicare HMO | Admitting: *Deleted

## 2023-05-12 DIAGNOSIS — E538 Deficiency of other specified B group vitamins: Secondary | ICD-10-CM | POA: Diagnosis not present

## 2023-05-12 NOTE — Progress Notes (Signed)
Pt given B12 injection IM left deltoid and tolerated well. °

## 2023-05-30 ENCOUNTER — Other Ambulatory Visit: Payer: Self-pay

## 2023-05-30 ENCOUNTER — Encounter (HOSPITAL_COMMUNITY): Payer: Self-pay

## 2023-05-30 ENCOUNTER — Emergency Department (HOSPITAL_COMMUNITY): Payer: Medicare HMO

## 2023-05-30 ENCOUNTER — Emergency Department (HOSPITAL_COMMUNITY)
Admission: EM | Admit: 2023-05-30 | Discharge: 2023-05-30 | Disposition: A | Discharge status: Home / Self Care | Payer: Medicare HMO | Attending: Emergency Medicine | Admitting: Emergency Medicine

## 2023-05-30 DIAGNOSIS — R270 Ataxia, unspecified: Secondary | ICD-10-CM | POA: Diagnosis not present

## 2023-05-30 DIAGNOSIS — S022XXA Fracture of nasal bones, initial encounter for closed fracture: Secondary | ICD-10-CM | POA: Insufficient documentation

## 2023-05-30 DIAGNOSIS — W1830XA Fall on same level, unspecified, initial encounter: Secondary | ICD-10-CM | POA: Insufficient documentation

## 2023-05-30 DIAGNOSIS — Y92007 Garden or yard of unspecified non-institutional (private) residence as the place of occurrence of the external cause: Secondary | ICD-10-CM | POA: Insufficient documentation

## 2023-05-30 DIAGNOSIS — Z7982 Long term (current) use of aspirin: Secondary | ICD-10-CM | POA: Insufficient documentation

## 2023-05-30 DIAGNOSIS — J3489 Other specified disorders of nose and nasal sinuses: Secondary | ICD-10-CM | POA: Diagnosis present

## 2023-05-30 MED ORDER — MUPIROCIN CALCIUM 2 % EX CREA: 1.0000 | TOPICAL_CREAM | Freq: Two times a day (BID) | CUTANEOUS | 0 refills | Status: DC

## 2023-05-30 MED ORDER — BACITRACIN ZINC 500 UNIT/GM EX OINT
TOPICAL_OINTMENT | Freq: Two times a day (BID) | CUTANEOUS | Status: DC
  Filled 2023-05-30: qty 0.9

## 2023-05-30 NOTE — Discharge Instructions (Signed)
In general most nasal bone fractures do not need to be fixed, it will heal by itself however over the next several days I would like for you to apply ice packs, cold compresses, try to keep your head slightly elevated when you sleep, you may apply topical antibiotic called mupirocin which I have prescribed  Return to the ER for severe or worsening headache vomiting changes in vision or any other worsening symptoms otherwise see your doctor in 1 week for a recheck

## 2023-05-30 NOTE — ED Provider Notes (Signed)
[place] EMERGENCY DEPARTMENT AT [facility] Provider Note   CSN: [id number] Arrival date & time: 05/30/23  1311     History  Chief Complaint  Patient presents with   Sabrina [name]    TILA KIJEK is a 77 y.o. female.   Fall  78 year old female presenting after having a fall in the garden where she fell forward while she was gardening, she got dizzy when she leaned forward and fell onto her face, she did not pass out, she had some assistance getting up off of the ground and was able to drive her self to the hospital.  She complains only of nose pain but has no dizziness chest pain shortness of breath or palpitations.  She does have a small abrasion over her nasal bridge and tenderness.  She is not having any active epistaxis.  According to the medical record the patient does not take any anticoagulants, she denies hitting her head just her nose.     Home Medications Prior to Admission medications   Medication Sig Start Date End Date Taking? Authorizing Provider  mupirocin cream (BACTROBAN) 2 % Apply 1 Application topically 2 (two) times daily. 05/30/23  Yes Eber Hong, MD  albuterol (VENTOLIN HFA) 108 (90 Base) MCG/ACT inhaler INHALE 2 PUFFS BY MOUTH EVERY 6 HOURS AS NEEDED FOR WHEEZE OR SHORTNESS OF BREATH 03/24/23   Jannifer Rodney A, FNP  aspirin EC 81 MG EC tablet Take 1 tablet (81 mg total) by mouth daily. Swallow whole. 08/02/21   Filbert Schilder, NP  atorvastatin (LIPITOR) 40 MG tablet Take 1 tablet (40 mg total) by mouth daily. 05/01/23 07/30/23  Antoine Poche, MD  blood glucose meter kit and supplies Dispense based on patient and insurance preference. Use up to four times daily as directed. (FOR ICD-10 E10.9, E11.9). 02/11/22   Daryll Drown, NP  Blood Glucose Monitoring Suppl (TRUE METRIX AIR GLUCOSE METER) w/Device KIT Test BS daily Dx R73.09 02/12/22   Daryll Drown, NP  cephALEXin (KEFLEX) 500 MG capsule Take 1 capsule (500 mg total) by mouth 2 (two) times  daily. 04/24/23   Junie Spencer, FNP  cromolyn (NASALCROM) 5.2 MG/ACT nasal spray Place 1 spray into both nostrils 2 (two) times daily as needed for allergies.    [provider]  DULoxetine (CYMBALTA) 60 MG capsule TAKE 1 CAPSULE EVERY DAY (NEEDS TO BE SEEN BEFORE NEXT REFILL) 03/24/23   Jannifer Rodney A, FNP  ferrous sulfate 325 (65 FE) MG tablet Take 325 mg by mouth once a week.    [provider]  fluticasone (FLONASE) 50 MCG/ACT nasal spray USE 2 SPRAYS IN EACH NOSTRIL EVERY DAY 03/24/23   Jannifer Rodney A, FNP  furosemide (LASIX) 20 MG tablet Take 1 tablet (20 mg total) by mouth daily. 03/24/23   Junie Spencer, FNP  gabapentin (NEURONTIN) 100 MG capsule 100 mg in AM and 300 mg in evening 03/24/23   Jannifer Rodney A, FNP  glucose blood (TRUE METRIX BLOOD GLUCOSE TEST) test strip Test BS daily Dx R73.09 02/12/22   Daryll Drown, NP  ILEVRO 0.3 % ophthalmic suspension Place 1 drop into the right eye daily. 03/12/23   [provider]  moxifloxacin (VIGAMOX) 0.5 % ophthalmic solution Place 1 drop into the right eye 3 (three) times daily. 03/11/23   [provider]  Multiple Vitamin (MULTIVITAMIN WITH MINERALS) TABS tablet Take 1 tablet by mouth in the morning.    [provider]  nitroGLYCERIN (NITROSTAT)  0.4 MG SL tablet Place 1 tablet (0.4 mg total) under the tongue every 5 (five) minutes x 3 doses as needed for chest pain. 02/07/22   Bhagat, Sharrell Ku, PA  Omega-3 Fatty Acids (FISH OIL PO) Take 1 capsule by mouth daily.    [provider]  pantoprazole (PROTONIX) 40 MG tablet Take 1 tablet (40 mg total) by mouth 2 (two) times daily. 03/24/23   Jannifer Rodney A, FNP  potassium chloride SA (KLOR-CON M) 20 MEQ tablet Take 1 tablet (20 mEq total) by mouth 2 (two) times daily. Take 1 tablet twice a day 02/26/22   Manson Passey, PA  prednisoLONE acetate (PRED FORTE) 1 % ophthalmic suspension Place 1 drop into the right eye 3 (three) times daily.  03/11/23   [provider]  SYMBICORT 80-4.5 MCG/ACT inhaler Inhale 2 puffs into the lungs 2 (two) times daily. Take Two puffs into the lungs in the morning and 2 puff at bedtime 05/01/23   Jannifer Rodney A, FNP  triamcinolone ointment (KENALOG) 0.5 % Apply 1 application topically 2 (two) times daily. 10/12/17   Junie Spencer, FNP  TRUEplus Lancets 33G MISC Test BS daily Dx R73.09 02/12/22   Daryll Drown, NP  vitamin C (ASCORBIC ACID) 500 MG tablet Take 500 mg by mouth daily.    [provider]  Vitamin D, Ergocalciferol, (DRISDOL) 1.25 MG (50000 UNIT) CAPS capsule TAKE ONE CAPSULE (50,000 UNITS DOSE) BY MOUTH ONCE A WEEK. [facility] 08/05/22   Junie Spencer, FNP      Allergies    Codeine    Review of Systems   Review of Systems  All other systems reviewed and are negative.   Physical Exam Updated Vital Signs BP 113/71   Pulse 70   Temp 97.6 F (36.4 C) (Oral)   Resp 20   Ht 1.626 m (5\' 4" )   Wt 60.3 kg   SpO2 100%   BMI 22.83 kg/m  Physical Exam Vitals and nursing note reviewed.  Constitutional:      General: She is not in acute distress.    Appearance: She is well-developed.  HENT:     Head: Normocephalic.     Comments: Tenderness over the nasal bridge with small abrasion, no open lacerations, no blood on the inside of the nose, clear oropharynx, no malocclusion or trismus.  No other signs of head injury or neck pain or tenderness to palpation    Mouth/Throat:     Pharynx: No oropharyngeal exudate.  Eyes:     General: No scleral icterus.       Right eye: No discharge.        Left eye: No discharge.     Conjunctiva/sclera: Conjunctivae normal.     Pupils: Pupils are equal, round, and reactive to light.  Neck:     Thyroid: No thyromegaly.     Vascular: No JVD.  Cardiovascular:     Rate and Rhythm: Normal rate and regular rhythm.     Heart sounds: Normal heart sounds. No murmur heard.    No friction rub. No gallop.  Pulmonary:     Effort:  Pulmonary effort is normal. No respiratory distress.     Breath sounds: Normal breath sounds. No wheezing or rales.  Abdominal:     General: Bowel sounds are normal. There is no distension.     Palpations: Abdomen is soft. There is no mass.     Tenderness: There is no abdominal tenderness.  Musculoskeletal:  General: No tenderness. Normal range of motion.     Cervical back: Normal range of motion and neck supple.  Lymphadenopathy:     Cervical: No cervical adenopathy.  Skin:    General: Skin is warm and dry.     Findings: No erythema or rash.  Neurological:     Mental Status: She is alert.     Coordination: Coordination normal.     Comments: Awake alert and following commands without difficulty.  Psychiatric:        Behavior: Behavior normal.     ED Results / Procedures / Treatments   Labs (all labs ordered are listed, but only abnormal results are displayed) Labs Reviewed - No data to display  EKG None  Radiology CT Maxillofacial Wo Contrast  Result Date: 05/30/2023 CLINICAL DATA:  Ataxia, head trauma EXAM: CT MAXILLOFACIAL WITHOUT CONTRAST TECHNIQUE: Multidetector CT imaging of the maxillofacial structures was performed. Multiplanar CT image reconstructions were also generated. RADIATION DOSE REDUCTION: This exam was performed according to the departmental dose-optimization program which includes automated exposure control, adjustment of the mA and/or kV according to patient size and/or use of iterative reconstruction technique. COMPARISON:  Head CT 10/06/2018 FINDINGS: Osseous: Acute mildly displaced bilateral nasal bone fractures. No additional maxillofacial bone fracture. Bony orbital walls are intact. Mandible intact. Temporomandibular joints are aligned without dislocation. Orbits: Negative. No traumatic or inflammatory finding. Sinuses: Clear. Soft tissues: Negative for hematoma. Limited intracranial: No significant or unexpected finding. IMPRESSION: Acute mildly  displaced bilateral nasal bone fractures. Electronically Signed   By: Duanne Guess D.O.   On: 05/30/2023 17:56    Procedures Procedures    Medications Ordered in ED Medications  bacitracin ointment (has no administration in time range)    ED Course/ Medical Decision Making/ A&P                                 Medical Decision Making Amount and/or Complexity of Data Reviewed Radiology: ordered.  Risk OTC drugs. Prescription drug management.   The patient has clearly had some kind of injury to her face when she fell forward but no significant head injury no neck tenderness no neurologic symptoms, she has no injuries to her arms or legs and has a perfect neurologic exam.  Will image the face to see what the extent of her facial injuries are, she is agreeable to the plan, local wound care with bacitracin and dressing.  Imaging: I personally viewed and interpreted the x-rays which show bilateral nasal bone fractures, I agree with radiology interpretation.  Labs: Not indicated  ED course: Ice pack and antibiotic ointment given, wound was cleaned and dressed.  ED course: Patient was informed of her results, she is agreeable to the plan, vitals normal, neurologically normal, she is stable for discharge        Final Clinical Impression(s) / ED Diagnoses Final diagnoses:  Closed fracture of nasal bone, initial encounter    Rx / DC Orders ED Discharge Orders          Ordered    mupirocin cream (BACTROBAN) 2 %  2 times daily        05/30/23 1815              Eber Hong, MD 05/30/23 1816

## 2023-05-30 NOTE — ED Triage Notes (Signed)
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

## 2023-06-02 ENCOUNTER — Encounter: Payer: Self-pay | Admitting: Family

## 2023-06-02 ENCOUNTER — Ambulatory Visit (INDEPENDENT_AMBULATORY_CARE_PROVIDER_SITE_OTHER): Payer: Medicare HMO | Admitting: Family

## 2023-06-02 VITALS — BP 111/71 | HR 77 | Temp 97.4°F | Ht 62.0 in | Wt 135.0 lb

## 2023-06-02 DIAGNOSIS — Z23 Encounter for immunization: Secondary | ICD-10-CM

## 2023-06-02 DIAGNOSIS — Z9181 History of falling: Secondary | ICD-10-CM | POA: Diagnosis not present

## 2023-06-02 DIAGNOSIS — Z09 Encounter for follow-up examination after completed treatment for conditions other than malignant neoplasm: Secondary | ICD-10-CM | POA: Diagnosis not present

## 2023-06-02 DIAGNOSIS — S022XXD Fracture of nasal bones, subsequent encounter for fracture with routine healing: Secondary | ICD-10-CM | POA: Diagnosis not present

## 2023-06-02 NOTE — Patient Instructions (Signed)
Nasal Fracture A nasal fracture is a break or crack in the bones of the nose or the tissue that helps to form the nose (cartilage). Minor breaks do not require treatment and usually heal on their own after about one month. Serious breaks may require treatment that could include surgery. What are the causes? A nasal fracture is usually caused by the strong force of a direct hit to the nose (blunt injury). This type of injury often occurs from: Playing a contact sport. Being involved in a car accident. Falling. Getting punched in the face. What are the signs or symptoms? Symptoms of this condition include: Pain. Swelling of the nose. Bleeding from the nose. Bruising around the nose or bruising around the eyes (black eyes). Crooked appearance of the nose. How is this diagnosed? This condition may be diagnosed based on a physical exam. During the exam, the health care provider will: Gently feel the nose for signs of broken bones. Look inside the nostrils to check if there is a blood-filled swelling on the dividing wall between the nostrils (septal hematoma). An X-ray of the nose may be taken. Sometimes, an X-ray may not show a nasal fracture even when one is present. In some cases, X-rays or a CT scan may be taken again 1-5 days later after the swelling has gone down. How is this treated? Treatment for this condition depends on the severity of the injury. Minor fractures that have not caused deformity often do not require treatment. They may heal on their own. For more serious fractures that have caused bones to move out of position, treatment may involve one of the following: Repositioning the bones without surgery. The health care provider may be able to do this in his or her office after you are given medicine to numb the nasal area (local anesthetic). Surgery. If surgery is needed, it will be done after the swelling is gone. Surgery will stabilize and align the fracture. Follow these  instructions at home:     Activity Return to your normal activities as told by your health care provider. Ask your health care provider what activities are safe for you. Do not play contact sports for 3-4 weeks or as told by your health care provider. Managing pain and swelling If directed, put ice on the injured area. To do this: Put ice in a plastic bag. Place a towel between your skin and the bag. Leave the ice on for 20 minutes, 2-3 times a day. Take off the ice if your skin turns bright red. This is very important. If you cannot feel pain, heat, or cold, you have a greater risk of damage to the area. General instructions Take over-the-counter and prescription medicines only as told by your health care provider. If your nose starts to bleed, sit in an upright position while you squeeze the soft parts of your nose against the dividing wall between your nostrils (septum) for 10 minutes. Try to avoid blowing your nose. Keep all follow-up visits. This is important. Contact a health care provider if: Your pain increases or becomes severe. You continue to have nosebleeds. The shape of your nose does not return to normal within 5 days. You have pus draining out of your nose. Get help right away if: You have bleeding from your nose that does not stop after you pinch your nostrils closed for 20 minutes and keep ice on your nose. You have clear fluid draining out of your nose. You notice swelling near the septum inside  the nose. This swelling is a septal hematoma that must be drained to help prevent infection. You have difficulty moving your eyes. You have repeated vomiting. These symptoms may be an emergency. Get help right away. Call 911. Do not wait to see if the symptoms will go away. Do not drive yourself to the hospital. Summary A nasal fracture is a break or crack in the bones or cartilage of the nose. The fracture is usually caused by a blunt injury to the nose. Symptoms include  pain, swelling, and facial bruising. Nasal fractures may heal on their own, or your health care provider may need to move the bones back into proper position. In some cases, surgery may be needed. This information is not intended to replace advice given to you by your health care provider. Make sure you discuss any questions you have with your health care provider. Document Revised: 04/17/2021 Document Reviewed: 04/17/2021 Elsevier Patient Education  2024 [employer].

## 2023-06-02 NOTE — Progress Notes (Signed)
Subjective:    Patient ID: Sabrina [name], female    DOB: 1945-11-07, 77 y.o.   MRN: [id number]  Chief Complaint  Patient presents with   Hospitalization Follow-up   Pt presents to the office today for hospital follow up. She was in her garden and fell face forward on the ground. She went to the ED and found to have a closed fracture of her nasal bone. Reports her pain is a 7 out 10.  Facial Injury  The incident occurred 3 to 5 days ago. The injury mechanism was a fall. There was no loss of consciousness. The pain is at a severity of 7/10. The pain is moderate. The pain has been intermittent since the injury. Associated symptoms include numbness (above lip). Pertinent negatives include no blurred vision, disorientation, headaches, memory loss, vomiting or weakness. She has tried acetaminophen for the symptoms. The treatment provided mild relief.      Review of Systems  Eyes:  Negative for blurred vision.  Gastrointestinal:  Negative for vomiting.  Neurological:  Positive for numbness (above lip). Negative for weakness and headaches.  Psychiatric/Behavioral:  Negative for memory loss.   All other systems reviewed and are negative.      Objective:   Physical Exam Vitals reviewed.  Constitutional:      General: She is not in acute distress.    Appearance: She is well-developed.  HENT:     Head: Normocephalic and atraumatic.     Nose: Signs of injury and nasal tenderness present.  Eyes:     Pupils: Pupils are equal, round, and reactive to light.  Neck:     Thyroid: No thyromegaly.  Cardiovascular:     Rate and Rhythm: Normal rate and regular rhythm.     Heart sounds: Normal heart sounds. No murmur heard. Pulmonary:     Effort: Pulmonary effort is normal. No respiratory distress.     Breath sounds: Normal breath sounds. No wheezing.  Abdominal:     General: Bowel sounds are normal. There is no distension.     Palpations: Abdomen is soft.     Tenderness: There is no  abdominal tenderness.  Musculoskeletal:        General: No tenderness. Normal range of motion.     Cervical back: Normal range of motion and neck supple.  Skin:    General: Skin is warm and dry.  Neurological:     Mental Status: She is alert and oriented to person, place, and time.     Cranial Nerves: No cranial nerve deficit.     Deep Tendon Reflexes: Reflexes are normal and symmetric.  Psychiatric:        Behavior: Behavior normal.        Thought Content: Thought content normal.        Judgment: Judgment normal.       BP 111/71   Pulse 77   Temp (!) 97.4 F (36.3 C) (Temporal)   Ht 5\' 2"  (1.575 m)   Wt 135 lb (61.2 kg)   SpO2 98%   BMI 24.69 kg/m      Assessment & Plan:  WINNIE CISNEY comes in today with chief complaint of Hospitalization Follow-up   Diagnosis and orders addressed:  1. Closed fracture of nasal bone with routine healing, subsequent encounter  2. Encounter for immunization - Flu Vaccine Trivalent High Dose (Fluad)  3. Hospital discharge follow-up  4. At risk for falling   Hospital notes reviewed Given symptoms and patient does not  want surgery Offered  pain management, does not want anything at this time Fall precautions discussed Follow up as needed   Jannifer Rodney, FNP

## 2023-06-12 ENCOUNTER — Ambulatory Visit (INDEPENDENT_AMBULATORY_CARE_PROVIDER_SITE_OTHER): Payer: Medicare HMO | Admitting: *Deleted

## 2023-06-12 DIAGNOSIS — E538 Deficiency of other specified B group vitamins: Secondary | ICD-10-CM | POA: Diagnosis not present

## 2023-06-12 NOTE — Progress Notes (Signed)
Pt given B12 injection IM right deltoid and tolerated well.

## 2023-06-19 ENCOUNTER — Other Ambulatory Visit: Payer: Self-pay | Admitting: Family

## 2023-06-19 DIAGNOSIS — J4520 Mild intermittent asthma, uncomplicated: Secondary | ICD-10-CM

## 2023-06-25 ENCOUNTER — Telehealth: Payer: Self-pay

## 2023-06-25 NOTE — Telephone Encounter (Signed)
Transition Care Management Follow-up Telephone Call Date of discharge and from where: 05/30/2023 [facility] How have you been since you were released from the hospital? Patient stated her nose is still sore and she has a headache. Any questions or concerns? No  Items Reviewed: Did the pt receive and understand the discharge instructions provided? Yes  Medications obtained and verified?  Patient stated she bought an OTC ointment. Other? No  Any new allergies since your discharge? No  Dietary orders reviewed? Yes Do you have support at home? Yes   Follow up appointments reviewed:  PCP Hospital f/u appt confirmed? Yes  Scheduled to see Christy A. Lendon Colonel, FNP on 06/02/2023 @ [facility]. Specialist Hospital f/u appt confirmed? No  Scheduled to see  on  @ . Are transportation arrangements needed? No  If their condition worsens, is the pt aware to call PCP or go to the Emergency Dept.? Yes Was the patient provided with contact information for the PCP's office or ED? Yes Was to pt encouraged to call back with questions or concerns? Yes  Verneda Hollopeter [facility]  [facility], [facility] Guide Direct Dial: [phone]  Website: [email or web address]

## 2023-06-26 ENCOUNTER — Ambulatory Visit (INDEPENDENT_AMBULATORY_CARE_PROVIDER_SITE_OTHER): Payer: Medicare HMO

## 2023-06-26 DIAGNOSIS — I442 Atrioventricular block, complete: Secondary | ICD-10-CM | POA: Diagnosis not present

## 2023-06-26 LAB — CUP PACEART REMOTE DEVICE CHECK
Battery Remaining Longevity: 39 mo
Battery Voltage: 2.87 V
Brady Statistic AP VP Percent: 0.15 %
Brady Statistic AP VS Percent: 99.82 %
Brady Statistic AS VP Percent: 0 %
Brady Statistic AS VS Percent: 0.03 %
Brady Statistic RA Percent Paced: 100 %
Brady Statistic RV Percent Paced: 0.15 %
Date Time Interrogation Session: 20241004021420
Implantable Lead Connection Status: 753985
Implantable Lead Connection Status: 753985
Implantable Lead Implant Date: 20181206
Implantable Lead Implant Date: 20181206
Implantable Lead Location: 753860
Implantable Lead Location: 753860
Implantable Lead Model: 3830
Implantable Lead Model: 5076
Implantable Pulse Generator Implant Date: 20181206
Lead Channel Impedance Value: 266 Ohm
Lead Channel Impedance Value: 342 Ohm
Lead Channel Impedance Value: 399 Ohm
Lead Channel Impedance Value: 551 Ohm
Lead Channel Sensing Intrinsic Amplitude: 11.875 mV
Lead Channel Sensing Intrinsic Amplitude: 11.875 mV
Lead Channel Sensing Intrinsic Amplitude: 3.625 mV
Lead Channel Sensing Intrinsic Amplitude: 3.625 mV
Lead Channel Setting Pacing Amplitude: 2 V
Lead Channel Setting Pacing Amplitude: 2.5 V
Lead Channel Setting Pacing Pulse Width: 0.3 ms
Lead Channel Setting Sensing Sensitivity: 2 mV
Zone Setting Status: 755011
Zone Setting Status: 755011

## 2023-07-05 ENCOUNTER — Emergency Department (HOSPITAL_COMMUNITY)
Admission: EM | Admit: 2023-07-05 | Discharge: 2023-07-05 | Disposition: A | Discharge status: Home / Self Care | Payer: Medicare HMO | Attending: Emergency Medicine | Admitting: Emergency Medicine

## 2023-07-05 ENCOUNTER — Encounter (HOSPITAL_COMMUNITY): Payer: Self-pay

## 2023-07-05 ENCOUNTER — Other Ambulatory Visit: Payer: Self-pay

## 2023-07-05 DIAGNOSIS — Z7982 Long term (current) use of aspirin: Secondary | ICD-10-CM | POA: Insufficient documentation

## 2023-07-05 DIAGNOSIS — E119 Type 2 diabetes mellitus without complications: Secondary | ICD-10-CM | POA: Diagnosis not present

## 2023-07-05 DIAGNOSIS — S51019A Laceration without foreign body of unspecified elbow, initial encounter: Secondary | ICD-10-CM

## 2023-07-05 DIAGNOSIS — S51811A Laceration without foreign body of right forearm, initial encounter: Secondary | ICD-10-CM | POA: Diagnosis not present

## 2023-07-05 DIAGNOSIS — R109 Unspecified abdominal pain: Secondary | ICD-10-CM | POA: Diagnosis present

## 2023-07-05 DIAGNOSIS — I10 Essential (primary) hypertension: Secondary | ICD-10-CM | POA: Insufficient documentation

## 2023-07-05 DIAGNOSIS — S51011A Laceration without foreign body of right elbow, initial encounter: Secondary | ICD-10-CM | POA: Diagnosis not present

## 2023-07-05 DIAGNOSIS — Z7951 Long term (current) use of inhaled steroids: Secondary | ICD-10-CM | POA: Diagnosis not present

## 2023-07-05 DIAGNOSIS — Z79899 Other long term (current) drug therapy: Secondary | ICD-10-CM | POA: Insufficient documentation

## 2023-07-05 DIAGNOSIS — N390 Urinary tract infection, site not specified: Secondary | ICD-10-CM | POA: Insufficient documentation

## 2023-07-05 DIAGNOSIS — J45909 Unspecified asthma, uncomplicated: Secondary | ICD-10-CM | POA: Insufficient documentation

## 2023-07-05 DIAGNOSIS — X58XXXA Exposure to other specified factors, initial encounter: Secondary | ICD-10-CM | POA: Diagnosis not present

## 2023-07-05 LAB — COMPREHENSIVE METABOLIC PANEL
ALT: 17 U/L (ref 0–44)
AST: 20 U/L (ref 15–41)
Albumin: 3.6 g/dL (ref 3.5–5.0)
Alkaline Phosphatase: 91 U/L (ref 38–126)
Anion gap: 11 (ref 5–15)
BUN: 19 mg/dL (ref 8–23)
CO2: 28 mmol/L (ref 22–32)
Calcium: 9.3 mg/dL (ref 8.9–10.3)
Chloride: 99 mmol/L (ref 98–111)
Creatinine, Ser: 0.72 mg/dL (ref 0.44–1.00)
GFR, Estimated: >60 mL/min (ref >60)
Glucose, Bld: 99 mg/dL (ref 70–99)
Potassium: 4.2 mmol/L (ref 3.5–5.1)
Sodium: 138 mmol/L (ref 135–145)
Total Bilirubin: 1 mg/dL (ref 0.3–1.2)
Total Protein: 7 g/dL (ref 6.5–8.1)

## 2023-07-05 LAB — CBC WITH DIFFERENTIAL/PLATELET
Abs Immature Granulocytes: 0.04 10*3/uL (ref 0.00–0.07)
Basophils Absolute: 0.1 10*3/uL (ref 0.0–0.1)
Basophils Relative: 1 %
Eosinophils Absolute: 0.2 10*3/uL (ref 0.0–0.5)
Eosinophils Relative: 2 %
HCT: 42.3 % (ref 36.0–46.0)
Hemoglobin: 13.4 g/dL (ref 12.0–15.0)
Immature Granulocytes: 0 %
Lymphocytes Relative: 16 %
Lymphs Abs: 1.6 10*3/uL (ref 0.7–4.0)
MCH: 28.5 pg (ref 26.0–34.0)
MCHC: 31.7 g/dL (ref 30.0–36.0)
MCV: 89.8 fL (ref 80.0–100.0)
Monocytes Absolute: 0.6 10*3/uL (ref 0.1–1.0)
Monocytes Relative: 7 %
Neutro Abs: 7.4 10*3/uL (ref 1.7–7.7)
Neutrophils Relative %: 74 %
Platelets: 341 10*3/uL (ref 150–400)
RBC: 4.71 MIL/uL (ref 3.87–5.11)
RDW: 16.8 % — ABNORMAL HIGH (ref 11.5–15.5)
WBC: 9.9 10*3/uL (ref 4.0–10.5)
nRBC: 0 % (ref 0.0–0.2)

## 2023-07-05 LAB — URINALYSIS, ROUTINE W REFLEX MICROSCOPIC
Bilirubin Urine: NEGATIVE
Glucose, UA: NEGATIVE mg/dL
Hgb urine dipstick: NEGATIVE
Ketones, ur: NEGATIVE mg/dL
Nitrite: POSITIVE — AB
Protein, ur: NEGATIVE mg/dL
Specific Gravity, Urine: 1.006 (ref 1.005–1.030)
pH: 6 (ref 5.0–8.0)

## 2023-07-05 MED ORDER — CEFPODOXIME PROXETIL 200 MG PO TABS: 200.0000 mg | ORAL_TABLET | Freq: Two times a day (BID) | ORAL | 0 refills | Status: AC

## 2023-07-05 MED ORDER — ACETAMINOPHEN 500 MG PO TABS
1000.0000 mg | ORAL_TABLET | Freq: Once | ORAL | Status: AC
  Administered 2023-07-05: 1000 mg via ORAL
  Filled 2023-07-05: qty 2

## 2023-07-05 MED ORDER — CEFDINIR 300 MG PO CAPS
300.0000 mg | ORAL_CAPSULE | Freq: Once | ORAL | Status: AC
  Administered 2023-07-05: 300 mg via ORAL
  Filled 2023-07-05: qty 1
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
## 2023-07-05 NOTE — Discharge Instructions (Signed)
The pleasure taking care of you today.  You are seen for urinary symptoms and do have signs of UTI.  The antibiotics you are put on in August did not appear to have been effective, I was able to view your prior cultures and we are to switch you to a different antibiotic that should be more effective.  Follow-up closely with your primary care doctor.  You also have a skin tear to your right forearm.  Keep this clean and dry and covered to protect it and follow-up with your primary care doctor in about 2 days for a wound check, if it is not healing you may need referral to wound center.
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
## 2023-07-07 ENCOUNTER — Ambulatory Visit: Payer: Medicare HMO | Admitting: Family

## 2023-07-07 ENCOUNTER — Encounter: Payer: Self-pay | Admitting: Family

## 2023-07-07 VITALS — BP 113/76 | HR 75 | Temp 97.4°F | Ht 64.0 in | Wt 138.0 lb

## 2023-07-07 DIAGNOSIS — I10 Essential (primary) hypertension: Secondary | ICD-10-CM | POA: Diagnosis not present

## 2023-07-07 DIAGNOSIS — S51011A Laceration without foreign body of right elbow, initial encounter: Secondary | ICD-10-CM | POA: Diagnosis not present

## 2023-07-07 DIAGNOSIS — F411 Generalized anxiety disorder: Secondary | ICD-10-CM

## 2023-07-07 DIAGNOSIS — N3000 Acute cystitis without hematuria: Secondary | ICD-10-CM
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
## 2023-07-07 NOTE — Patient Instructions (Signed)

## 2023-07-08 LAB — URINE CULTURE: Culture: >=100000 — AB

## 2023-07-08 NOTE — Progress Notes (Signed)
Remote pacemaker transmission.   

## 2023-07-09 ENCOUNTER — Telehealth (HOSPITAL_BASED_OUTPATIENT_CLINIC_OR_DEPARTMENT_OTHER): Payer: Self-pay

## 2023-07-09 NOTE — Progress Notes (Signed)
ED Antimicrobial Stewardship Positive Culture Follow Up   Sabrina [name] is an 77 y.o. female who presented to [facility] on 07/05/2023 with a chief complaint of  Chief Complaint  Patient presents with   Flank Pain    Recent Results (from the past 720 hour(s))  Urine Culture     Status: Abnormal   Collection Time: 07/05/23  1:20 PM   Specimen: Urine, Clean Catch  Result Value Ref Range Status   Specimen Description   Final    URINE, CLEAN CATCH Performed at [facility], [address], [place], [place] [address]    Special Requests   Final    NONE Performed at [facility], [address] [place], [place] [address]    Culture >=100,000 COLONIES/mL ESCHERICHIA COLI (A)  Final   Report Status 07/08/2023 FINAL  Final   Organism ID, Bacteria ESCHERICHIA COLI (A)  Final      Susceptibility   Escherichia coli - MIC*    AMPICILLIN >=32 RESISTANT Resistant     CEFAZOLIN 16 SENSITIVE Sensitive     CEFTRIAXONE <=0.25 SENSITIVE Sensitive     CIPROFLOXACIN <=0.25 SENSITIVE Sensitive     GENTAMICIN <=1 SENSITIVE Sensitive     IMIPENEM <=0.25 SENSITIVE Sensitive     NITROFURANTOIN 32 SENSITIVE Sensitive     TRIMETH/SULFA <=20 SENSITIVE Sensitive     AMPICILLIN/SULBACTAM >=32 RESISTANT Resistant     * >=100,000 COLONIES/mL ESCHERICHIA COLI    [x]  Treated with cefpodoxime, organism MAY be resistant to prescribed antimicrobial  New antibiotic prescription: Call for symptom check. If patient's symptoms have resolved, continue cefpodoxime until course is completed. If symptoms have persisted, discontinue cefpodoxime and START Bactrim DS 1 tablet po BID x 5 days.  ED Provider: Dr. Smith Robert, PharmD, BCPS 07/09/2023, 10:38 AM Clinical Pharmacist Monday - Friday phone -  [phone] Saturday - Sunday phone - [phone]

## 2023-07-09 NOTE — Telephone Encounter (Signed)
Post ED Visit - Positive Culture Follow-up: Successful Patient Follow-Up  Culture assessed and recommendations reviewed by:  []  Enzo Bi, Pharm.D. []  Celedonio Miyamoto, Pharm.D., BCPS AQ-ID [x]  Wilburn Cornelia., BCPS []  Georgina Pillion, Pharm.D., BCPS []  [place], [address].D., BCPS, AAHIVP []  Estella Husk, Pharm.D., BCPS, AAHIVP []  Lysle Pearl, PharmD, BCPS []  Phillips Climes, PharmD, BCPS []  Agapito Games, PharmD, BCPS []  Verlan Friends, PharmD  Positive urine culture  []  Patient discharged without antimicrobial prescription and treatment is now indicated [x]  Organism is resistant to prescribed ED discharge antimicrobial []  Patient with positive blood cultures  Changes discussed with ED provider: Donnelly Angelica, MD New antibiotic prescription Bactrim DS 1 tablet po BID x 5 days  Plan: call for symptom check, if symptoms persist start Bactrim Ds is symptoms have resovled continue taking Cefpodoxime.  Spoke with pt, pt stated her symptoms have resolved and she is feeling better. Instructed pt to continue Cefpodoxime. Bactrim not needed and was not called to pharmacy.  Contacted patient, date 07/09/23, time 12:25 pm   Sandria Senter 07/09/2023, 12:26 PM

## 2023-07-13 ENCOUNTER — Ambulatory Visit (INDEPENDENT_AMBULATORY_CARE_PROVIDER_SITE_OTHER): Payer: Medicare HMO | Admitting: *Deleted

## 2023-07-13 DIAGNOSIS — H524 Presbyopia: Secondary | ICD-10-CM | POA: Diagnosis not present

## 2023-07-13 DIAGNOSIS — E538 Deficiency of other specified B group vitamins: Secondary | ICD-10-CM | POA: Diagnosis not present

## 2023-07-13 DIAGNOSIS — H43813 Vitreous degeneration, bilateral: Secondary | ICD-10-CM | POA: Diagnosis not present

## 2023-07-13 DIAGNOSIS — E119 Type 2 diabetes mellitus without complications: Secondary | ICD-10-CM | POA: Diagnosis not present

## 2023-07-13 DIAGNOSIS — H5203 Hypermetropia, bilateral: Secondary | ICD-10-CM | POA: Diagnosis not present

## 2023-07-13 DIAGNOSIS — Z961 Presence of intraocular lens: Secondary | ICD-10-CM | POA: Diagnosis not present

## 2023-07-13 NOTE — Progress Notes (Signed)
Pt in today for B12 injection, tolerated well.

## 2023-07-24 ENCOUNTER — Encounter: Payer: Self-pay | Admitting: Internal Medicine

## 2023-07-24 ENCOUNTER — Ambulatory Visit: Payer: Medicare HMO | Attending: Internal Medicine | Admitting: Internal Medicine

## 2023-07-24 VITALS — BP 128/78 | HR 81 | Ht 64.0 in | Wt 135.4 lb

## 2023-07-24 DIAGNOSIS — I4821 Permanent atrial fibrillation: Secondary | ICD-10-CM

## 2023-07-24 LAB — CUP PACEART INCLINIC DEVICE CHECK
Date Time Interrogation Session: 20241101153027
Implantable Lead Connection Status: 753985
Implantable Lead Connection Status: 753985
Implantable Lead Implant Date: 20181206
Implantable Lead Implant Date: 20181206
Implantable Lead Location: 753860
Implantable Lead Location: 753860
Implantable Lead Model: 3830
Implantable Lead Model: 5076
Implantable Pulse Generator Implant Date: 20181206

## 2023-07-24 NOTE — Progress Notes (Signed)
HPI Sabrina [name] returns today for followup. She is a pleasant 77 yo woman with a h/o sinus node dysfunction, s/p PPM insertion. She has a h/o morbid obesity and has started diet and exercise therapy. She has lost almost 80 lbs in 3 years. She has had an episode of GI bleeding and her OAC was stopped.  Allergies  Allergen Reactions   Codeine Nausea And Vomiting    Stomach pain also     Current Outpatient Medications  Medication Sig Dispense Refill   albuterol (VENTOLIN HFA) 108 (90 Base) MCG/ACT inhaler INHALE 2 PUFFS BY MOUTH EVERY 6 HOURS AS NEEDED FOR WHEEZE OR SHORTNESS OF BREATH 1 each 1   aspirin EC 81 MG EC tablet Take 1 tablet (81 mg total) by mouth daily. Swallow whole. 30 tablet 11   atorvastatin (LIPITOR) 40 MG tablet Take 1 tablet (40 mg total) by mouth daily. 90 tablet 3   blood glucose meter kit and supplies Dispense based on patient and insurance preference. Use up to four times daily as directed. (FOR ICD-10 E10.9, E11.9). 1 each 0   Blood Glucose Monitoring Suppl (TRUE METRIX AIR GLUCOSE METER) w/Device KIT Test BS daily Dx R73.09 1 kit 0   cromolyn (NASALCROM) 5.2 MG/ACT nasal spray Place 1 spray into both nostrils 2 (two) times daily as needed for allergies.     DULoxetine (CYMBALTA) 60 MG capsule TAKE 1 CAPSULE EVERY DAY (NEEDS TO BE SEEN BEFORE NEXT REFILL) 90 capsule 0   ferrous sulfate 325 (65 FE) MG tablet Take 325 mg by mouth once a week.     fluticasone (FLONASE) 50 MCG/ACT nasal spray SPRAY 2 SPRAYS INTO EACH NOSTRIL EVERY DAY 48 mL 3   furosemide (LASIX) 20 MG tablet Take 1 tablet (20 mg total) by mouth daily. 90 tablet 1   gabapentin (NEURONTIN) 100 MG capsule 100 mg in AM and 300 mg in evening 360 capsule 1   glucose blood (TRUE METRIX BLOOD GLUCOSE TEST) test strip Test BS daily Dx R73.09 100 each 3   ILEVRO 0.3 % ophthalmic suspension Place 1 drop into the right eye daily.     moxifloxacin (VIGAMOX) 0.5 % ophthalmic solution Place 1 drop into the  right eye 3 (three) times daily.     Multiple Vitamin (MULTIVITAMIN WITH MINERALS) TABS tablet Take 1 tablet by mouth in the morning.     mupirocin cream (BACTROBAN) 2 % Apply 1 Application topically 2 (two) times daily. 15 g 0   nitroGLYCERIN (NITROSTAT) 0.4 MG SL tablet Place 1 tablet (0.4 mg total) under the tongue every 5 (five) minutes x 3 doses as needed for chest pain. 25 tablet 12   Omega-3 Fatty Acids (FISH OIL PO) Take 1 capsule by mouth daily.     pantoprazole (PROTONIX) 40 MG tablet Take 1 tablet (40 mg total) by mouth 2 (two) times daily. 180 tablet 1   potassium chloride SA (KLOR-CON M) 20 MEQ tablet Take 1 tablet (20 mEq total) by mouth 2 (two) times daily. Take 1 tablet twice a day 180 tablet 1   prednisoLONE acetate (PRED FORTE) 1 % ophthalmic suspension Place 1 drop into the right eye 3 (three) times daily.     SYMBICORT 80-4.5 MCG/ACT inhaler Inhale 2 puffs into the lungs 2 (two) times daily. Take Two puffs into the lungs in the morning and 2 puff at bedtime 1 each 2   triamcinolone ointment (KENALOG) 0.5 % Apply 1 application topically 2 (two) times  daily. 30 g 0   TRUEplus Lancets 33G MISC Test BS daily Dx R73.09 100 each 3   vitamin C (ASCORBIC ACID) 500 MG tablet Take 500 mg by mouth daily.     Vitamin D, Ergocalciferol, (DRISDOL) 1.25 MG (50000 UNIT) CAPS capsule TAKE ONE CAPSULE (50,000 UNITS DOSE) BY MOUTH ONCE A WEEK. WEDNESDAY 12 capsule 1   Current Facility-Administered Medications  Medication Dose Route Frequency Provider Last Rate Last Admin   cyanocobalamin (VITAMIN B12) injection 1,000 mcg  1,000 mcg Intramuscular Q30 days Jannifer Rodney A, FNP   1,000 mcg at 07/13/23 1421     Past Medical History:  Diagnosis Date   Anemia    Ankle fracture, right    past hx. -"no surgery"   Anxiety    Asthma    CHF (congestive heart failure) (HCC) 2009   Chronic lower back pain    Collagen vascular disease (HCC)    COPD (chronic obstructive pulmonary disease) (HCC)     Depression    Fibromyalgia    GERD (gastroesophageal reflux disease)    Hyperlipidemia    Hypertension    Immature cataract of both eyes    Myocardial infarction (HCC)    "I've had a light one; don't know when it happened" (08/27/2017)   Osteoarthritis    Peripheral neuropathy    legs and feet   Persistent atrial fibrillation (HCC)    Presence of Watchman left atrial appendage closure device 08/01/2021   24 mm Watchman FLXDevice LOT # [id number] by Dr. Lalla Brothers   Tremors of nervous system    noted in hands by pt last 6 months   Tubular adenoma of colon     ROS:   All systems reviewed and negative except as noted in the HPI.   Past Surgical History:  Procedure Laterality Date   APPENDECTOMY     AV NODE ABLATION N/A 08/27/2017   Procedure: AV NODE ABLATION;  Surgeon: [name], MD;  Location: [facility];  Service: Cardiovascular;  Laterality: N/A;   CATARACT EXTRACTION W/PHACO Right 03/16/2023   Procedure: CATARACT EXTRACTION PHACO AND INTRAOCULAR LENS PLACEMENT (IOC);  Surgeon: [name], MD;  Location: [facility];  Service: Ophthalmology;  Laterality: Right;  CDE: 10.62   CATARACT EXTRACTION W/PHACO Left 05/08/2023   Procedure: CATARACT EXTRACTION PHACO AND INTRAOCULAR LENS PLACEMENT (IOC);  Surgeon: [name], MD;  Location: [facility];  Service: Ophthalmology;  Laterality: Left;  CDE 11.28   CHOLECYSTECTOMY OPEN  1978   DILATION AND CURETTAGE OF UTERUS     FEMUR FRACTURE SURGERY Left 2013   "put 7" rod in it"   FRACTURE SURGERY     IVC FILTER REMOVAL N/A 06/20/2021   Procedure: IVC FILTER REMOVAL;  Surgeon: Cephus Shelling, MD;  Location: [facility];  Service: Cardiovascular;  Laterality: N/A;   LAPAROSCOPY  08/22/2016   Procedure: LAPAROSCOPY DIAGNOSTIC;  Surgeon: Romie Levee, MD;  Location: [facility];  Service: General;;   LEFT ATRIAL APPENDAGE OCCLUSION N/A 08/01/2021   Procedure: LEFT ATRIAL APPENDAGE OCCLUSION;  Surgeon: Lanier Prude, MD;   Location: [facility];  Service: Cardiovascular;  Laterality: N/A;   LEFT HEART CATH AND CORONARY ANGIOGRAPHY N/A 02/06/2022   Procedure: LEFT HEART CATH AND CORONARY ANGIOGRAPHY;  Surgeon: Lennette Bihari, MD;  Location: [facility];  Service: Cardiovascular;  Laterality: N/A;   MEDIAL PARTIAL KNEE REPLACEMENT Right 2005   "@ [facility]"   PACEMAKER IMPLANT N/A 08/27/2017   Procedure:  PACEMAKER IMPLANT;  Surgeon: [name], MD;  Location: [facility];  Service: Cardiovascular;  Laterality: N/A;   ROUX-EN-Y GASTRIC BYPASS  2002   [facility]   SPLENECTOMY  2002   TEE WITHOUT CARDIOVERSION N/A 08/01/2021   Procedure: TRANSESOPHAGEAL ECHOCARDIOGRAM (TEE);  Surgeon: Lanier Prude, MD;  Location: [facility] IN[facility];  Service: Cardiovascular;  Laterality: N/A;   TONSILLECTOMY  1944   TUBAL LIGATION     VAGINAL HYSTERECTOMY       Family History  Problem Relation Age of Onset   Cancer Mother    Alzheimer's disease Mother    Heart disease Mother    Arthritis Father    Asthma Daughter    Arthritis Daughter    Obesity Daughter    Colon cancer Paternal Uncle 46   Arthritis Son    Hyperlipidemia Son    Obesity Son    Arthritis Son    Obesity Son    BRCA 1/2 Neg Hx      Social History   Socioeconomic History   Marital status: Divorced    Spouse name: Not on file   Number of children: 3   Years of education: 2 years of college   Highest education level: Some college, no degree  Occupational History   Occupation: Retired  Tobacco Use   Smoking status: Former    Current packs/day: 0.00    Average packs/day: 0.5 packs/day for 25.0 years (12.5 ttl pk-yrs)    Types: Cigarettes    Start date: 02/27/1968    Quit date: 02/26/1993    Years since quitting: 30.4   Smokeless tobacco: Never  Vaping Use   Vaping status: Never Used  Substance and Sexual Activity   Alcohol use: Not Currently    Comment: 08/27/2017 "nothing since early 2000s"   Drug  use: Not Currently   Sexual activity: Not Currently    Birth control/protection: Surgical  Other Topics Concern   Not on file  Social History Narrative   ** Merged History Encounter **       Pt is right handed Lives in single story home with her grandson Has 3 adult children Associated degree  Retired Lawyer   Social Determinants of Corporate investment banker Strain: Low Risk  (03/04/2023)   Overall Financial Resource Strain (CARDIA)    Difficulty of Paying Living Expenses: Not hard at all  Food Insecurity: No Food Insecurity (03/04/2023)   Hunger Vital Sign    Worried About Running Out of Food in the Last Year: Never true    Ran Out of Food in the Last Year: Never true  Transportation Needs: No Transportation Needs (03/04/2023)   PRAPARE - Administrator, Civil Service (Medical): No    Lack of Transportation (Non-Medical): No  Physical Activity: Insufficiently Active (03/04/2023)   Exercise Vital Sign    Days of Exercise per Week: 3 days    Minutes of Exercise per Session: 30 min  Stress: No Stress Concern Present (03/04/2023)   [employer] of Occupational Health - Occupational Stress Questionnaire    Feeling of Stress : Not at all  Social Connections: Moderately Integrated (03/04/2023)   Social Connection and Isolation Panel [NHANES]    Frequency of Communication with Friends and Family: More than three times a week    Frequency of Social Gatherings with Friends and Family: More than three times a week    Attends Religious Services: More than 4 times per year  Active Member of Clubs or Organizations: Yes    Attends Banker Meetings: More than 4 times per year    Marital Status: Divorced  Intimate Partner Violence: Not At Risk (03/04/2023)   Humiliation, Afraid, Rape, and Kick questionnaire    Fear of Current or Ex-Partner: No    Emotionally Abused: No    Physically Abused: No    Sexually Abused: No     BP 128/78 (BP Location: Right Arm,  Patient Position: Sitting, Cuff Size: Normal)   Pulse 81   Ht 5\' 4"  (1.626 m)   Wt 135 lb 6.4 oz (61.4 kg)   SpO2 97%   BMI 23.24 kg/m   Physical Exam:  Well appearing 77 yo woman, NAD HEENT: Unremarkable Neck:  No JVD, no thyromegally Lymphatics:  No adenopathy Back:  No CVA tenderness Lungs:  Clear with no wheezes HEART:  Regular rate rhythm, no murmurs, no rubs, no clicks Abd:  soft, positive bowel sounds, no organomegally, no rebound, no guarding Ext:  2 plus pulses, no edema, no cyanosis, no clubbing Skin:  No rashes no nodules Neuro:  CN II through XII intact, motor grossly intact   DEVICE  Normal device function.  See PaceArt for details.   Assess/Plan:  Atrial fib - her rates are well controlled. She is s/p AV node ablation. PPM - her medtronic DDD PM is working normally. She is pacing in her His bundle (atrial port). She is s/p Watchman device.  HTN - her bp is well controlled. We will follow. Obesity - she has lost almost 80 lbs. No indication for more weight loss.   Sabrina Gowda Ayo Guarino,MD

## 2023-07-24 NOTE — Patient Instructions (Signed)

## 2023-07-26 ENCOUNTER — Other Ambulatory Visit: Payer: Self-pay | Admitting: Family

## 2023-07-26 DIAGNOSIS — F411 Generalized anxiety disorder: Secondary | ICD-10-CM

## 2023-07-27 ENCOUNTER — Ambulatory Visit: Payer: Medicare HMO | Admitting: Family

## 2023-07-27 ENCOUNTER — Encounter: Payer: Self-pay | Admitting: Family

## 2023-08-06 ENCOUNTER — Ambulatory Visit (INDEPENDENT_AMBULATORY_CARE_PROVIDER_SITE_OTHER): Payer: Medicare HMO | Admitting: Family

## 2023-08-06 ENCOUNTER — Encounter: Payer: Self-pay | Admitting: Family

## 2023-08-06 VITALS — BP 122/80 | HR 72 | Temp 97.4°F | Ht 64.0 in | Wt 138.8 lb

## 2023-08-06 DIAGNOSIS — N3000 Acute cystitis without hematuria: Secondary | ICD-10-CM | POA: Diagnosis not present

## 2023-08-06 DIAGNOSIS — R399 Unspecified symptoms and signs involving the genitourinary system: Secondary | ICD-10-CM | POA: Diagnosis not present

## 2023-08-06 LAB — URINALYSIS, COMPLETE
Bilirubin, UA: NEGATIVE
Glucose, UA: NEGATIVE
Ketones, UA: NEGATIVE
Nitrite, UA: POSITIVE — AB
Protein,UA: NEGATIVE
RBC, UA: NEGATIVE
Specific Gravity, UA: 1.02 (ref 1.005–1.030)
Urobilinogen, Ur: 0.2 mg/dL (ref 0.2–1.0)
pH, UA: 7 (ref 5.0–7.5)

## 2023-08-06 LAB — MICROSCOPIC EXAMINATION
RBC, Urine: NONE SEEN /[HPF] (ref 0–2)
WBC, UA: >30 /[HPF] — AB (ref 0–5)
Yeast, UA: NONE SEEN

## 2023-08-06 MED ORDER — CEPHALEXIN 500 MG PO CAPS
500.0000 mg | ORAL_CAPSULE | Freq: Two times a day (BID) | ORAL | 0 refills | Status: DC
Start: 2023-08-06 — End: 2023-08-14

## 2023-08-06 NOTE — Progress Notes (Signed)
   Subjective:    Patient ID: Sabrina [name], female    DOB: 01-22-1946, 77 y.o.   MRN: [id number]  Chief Complaint  Patient presents with   Urinary Tract Infection    Dysuria  This is a new problem. The current episode started in the past 7 days. The problem occurs every urination. The problem has been gradually worsening. The quality of the pain is described as burning. The pain is at a severity of 4/10. The pain is mild. There has been no fever. Associated symptoms include flank pain, frequency, hesitancy and urgency. Pertinent negatives include no hematuria. She has tried increased fluids for the symptoms. The treatment provided mild relief.      Review of Systems  Genitourinary:  Positive for dysuria, flank pain, frequency, hesitancy and urgency. Negative for hematuria.  All other systems reviewed and are negative.      Objective:   Physical Exam Vitals reviewed.  Constitutional:      General: She is not in acute distress.    Appearance: She is well-developed.  HENT:     Head: Normocephalic and atraumatic.  Eyes:     Pupils: Pupils are equal, round, and reactive to light.  Neck:     Thyroid: No thyromegaly.  Cardiovascular:     Rate and Rhythm: Normal rate and regular rhythm.     Heart sounds: Normal heart sounds. No murmur heard. Pulmonary:     Effort: Pulmonary effort is normal. No respiratory distress.     Breath sounds: Normal breath sounds. No wheezing.  Abdominal:     General: Bowel sounds are normal. There is no distension.     Palpations: Abdomen is soft.     Tenderness: There is no abdominal tenderness.  Musculoskeletal:        General: No tenderness. Normal range of motion.     Cervical back: Normal range of motion and neck supple.  Skin:    General: Skin is warm and dry.  Neurological:     Mental Status: She is alert and oriented to person, place, and time.     Cranial Nerves: No cranial nerve deficit.     Deep Tendon Reflexes: Reflexes are  normal and symmetric.  Psychiatric:        Behavior: Behavior normal.        Thought Content: Thought content normal.        Judgment: Judgment normal.       BP 122/80   Pulse 72   Temp (!) 97.4 F (36.3 C) (Temporal)   Ht 5\' 4"  (1.626 m)   Wt 138 lb 12.8 oz (63 kg)   SpO2 99%   BMI 23.82 kg/m      Assessment & Plan:  [name] comes in today with chief complaint of Urinary Tract Infection   Diagnosis and orders addressed:  1. UTI symptoms - Urinalysis, Complete - Urine Culture  2. Acute cystitis without hematuria Force fluids AZO over the counter X2 days RTO prn Culture pending Follow up if symptoms worsen or do not improve  - cephALEXin (KEFLEX) 500 MG capsule; Take 1 capsule (500 mg total) by mouth 2 (two) times daily.  Dispense: 14 capsule; Refill: 0   Jannifer Rodney, FNP

## 2023-08-06 NOTE — Patient Instructions (Signed)
Urinary Tract Infection, Adult  A urinary tract infection (UTI) is an infection of any part of the urinary tract. The urinary tract includes the kidneys, ureters, bladder, and urethra. These organs make, store, and get rid of urine in the body. An upper UTI affects the ureters and kidneys. A lower UTI affects the bladder and urethra. What are the causes? Most urinary tract infections are caused by bacteria in your genital area around your urethra, where urine leaves your body. These bacteria grow and cause inflammation of your urinary tract. What increases the risk? You are more likely to develop this condition if: You have a urinary catheter that stays in place. You are not able to control when you urinate or have a bowel movement (incontinence). You are female and you: Use a spermicide or diaphragm for birth control. Have low estrogen levels. Are pregnant. You have certain genes that increase your risk. You are sexually active. You take antibiotic medicines. You have a condition that causes your flow of urine to slow down, such as: An enlarged prostate, if you are female. Blockage in your urethra. A kidney stone. A nerve condition that affects your bladder control (neurogenic bladder). Not getting enough to drink, or not urinating often. You have certain medical conditions, such as: Diabetes. A weak disease-fighting system (immunesystem). Sickle cell disease. Gout. Spinal cord injury. What are the signs or symptoms? Symptoms of this condition include: Needing to urinate right away (urgency). Frequent urination. This may include small amounts of urine each time you urinate. Pain or burning with urination. Blood in the urine. Urine that smells bad or unusual. Trouble urinating. Cloudy urine. Vaginal discharge, if you are female. Pain in the abdomen or the lower back. You may also have: Vomiting or a decreased appetite. Confusion. Irritability or tiredness. A fever or  chills. Diarrhea. The first symptom in older adults may be confusion. In some cases, they may not have any symptoms until the infection has worsened. How is this diagnosed? This condition is diagnosed based on your medical history and a physical exam. You may also have other tests, including: Urine tests. Blood tests. Tests for STIs (sexually transmitted infections). If you have had more than one UTI, a cystoscopy or imaging studies may be done to determine the cause of the infections. How is this treated? Treatment for this condition includes: Antibiotic medicine. Over-the-counter medicines to treat discomfort. Drinking enough water to stay hydrated. If you have frequent infections or have other conditions such as a kidney stone, you may need to see a health care provider who specializes in the urinary tract (urologist). In rare cases, urinary tract infections can cause sepsis. Sepsis is a life-threatening condition that occurs when the body responds to an infection. Sepsis is treated in the hospital with IV antibiotics, fluids, and other medicines. Follow these instructions at home:  Medicines Take over-the-counter and prescription medicines only as told by your health care provider. If you were prescribed an antibiotic medicine, take it as told by your health care provider. Do not stop using the antibiotic even if you start to feel better. General instructions Make sure you: Empty your bladder often and completely. Do not hold urine for long periods of time. Empty your bladder after sex. Wipe from front to back after urinating or having a bowel movement if you are female. Use each tissue only one time when you wipe. Drink enough fluid to keep your urine pale yellow. Keep all follow-up visits. This is important. Contact a health   care provider if: Your symptoms do not get better after 1-2 days. Your symptoms go away and then return. Get help right away if: You have severe pain in  your back or your lower abdomen. You have a fever or chills. You have nausea or vomiting. Summary A urinary tract infection (UTI) is an infection of any part of the urinary tract, which includes the kidneys, ureters, bladder, and urethra. Most urinary tract infections are caused by bacteria in your genital area. Treatment for this condition often includes antibiotic medicines. If you were prescribed an antibiotic medicine, take it as told by your health care provider. Do not stop using the antibiotic even if you start to feel better. Keep all follow-up visits. This is important. This information is not intended to replace advice given to you by your health care provider. Make sure you discuss any questions you have with your health care provider. Document Revised: 04/15/2020 Document Reviewed: 04/20/2020 Elsevier Patient Education  2024 [employer]  

## 2023-08-11 ENCOUNTER — Telehealth: Payer: Self-pay | Admitting: Family Medicine

## 2023-08-11 DIAGNOSIS — K6289 Other specified diseases of anus and rectum: Secondary | ICD-10-CM

## 2023-08-11 LAB — URINE CULTURE

## 2023-08-11 MED ORDER — TERCONAZOLE 0.4 % VA CREA: 1.0000 | TOPICAL_CREAM | Freq: Every day | VAGINAL | 0 refills | Status: DC

## 2023-08-11 NOTE — Telephone Encounter (Signed)
Copied from CRM [id number]. Topic: Clinical - Medical Advice >> Aug 11, 2023 10:26 AM Theodis Sato wrote: Reason for CRM: PT states she needs to speak with Jannifer Rodney nurse as she is treating her for a bladder infection, PT states her infection  has spread to her anal area there is strong burn feeling.

## 2023-08-11 NOTE — Addendum Note (Signed)
Addended by: Jannifer Rodney A on: 08/11/2023 04:16 PM   Modules accepted: Orders

## 2023-08-11 NOTE — Telephone Encounter (Signed)
Looks looks like stool coming out of vagina. Fistula from 2 years ago looks like the some she has had then would like to go to [employer]

## 2023-08-11 NOTE — Addendum Note (Signed)
Addended by: Jannifer Rodney A on: 08/11/2023 01:19 PM   Modules accepted: Orders

## 2023-08-11 NOTE — Telephone Encounter (Signed)
Patient aware and verbalized understanding. °

## 2023-08-11 NOTE — Telephone Encounter (Signed)
I have sent a prescription for vaginal cream that she can also apply around her rectum. Is more than likely yeast related to the antibiotics.

## 2023-08-11 NOTE — Telephone Encounter (Signed)
Referral to GI placed

## 2023-08-13 ENCOUNTER — Ambulatory Visit (INDEPENDENT_AMBULATORY_CARE_PROVIDER_SITE_OTHER): Payer: Medicare HMO

## 2023-08-13 DIAGNOSIS — E538 Deficiency of other specified B group vitamins: Secondary | ICD-10-CM | POA: Diagnosis not present

## 2023-08-13 NOTE — Progress Notes (Signed)
Cyanocobalamin injection given to right deltoid.  Patient tolerated well. 

## 2023-08-14 ENCOUNTER — Telehealth: Payer: Self-pay | Admitting: *Deleted

## 2023-08-14 ENCOUNTER — Encounter: Payer: Self-pay | Admitting: Gastroenterology

## 2023-08-14 ENCOUNTER — Ambulatory Visit (INDEPENDENT_AMBULATORY_CARE_PROVIDER_SITE_OTHER): Payer: Medicare HMO | Admitting: Gastroenterology

## 2023-08-14 VITALS — BP 128/81 | HR 73 | Temp 96.3°F | Ht 64.0 in | Wt 138.2 lb

## 2023-08-14 DIAGNOSIS — R159 Full incontinence of feces: Secondary | ICD-10-CM | POA: Diagnosis not present

## 2023-08-14 DIAGNOSIS — K644 Residual hemorrhoidal skin tags: Secondary | ICD-10-CM

## 2023-08-14 DIAGNOSIS — K6289 Other specified diseases of anus and rectum: Secondary | ICD-10-CM

## 2023-08-14 DIAGNOSIS — N824 Other female intestinal-genital tract fistulae: Secondary | ICD-10-CM | POA: Diagnosis not present

## 2023-08-14 DIAGNOSIS — N823 Fistula of vagina to large intestine: Secondary | ICD-10-CM

## 2023-08-14 DIAGNOSIS — Z860101 Personal history of adenomatous and serrated colon polyps: Secondary | ICD-10-CM | POA: Diagnosis not present

## 2023-08-14 DIAGNOSIS — N390 Urinary tract infection, site not specified: Secondary | ICD-10-CM

## 2023-08-14 NOTE — Patient Instructions (Signed)
Start imodium 1/2 tablet every morning. CT scan to be scheduled to look for fistula. Complete urine culture today.

## 2023-08-14 NOTE — Progress Notes (Signed)
GI Office Note    Referring Provider: Junie Spencer, FNP Primary Care Physician:  Junie Spencer, FNP  Primary Gastroenterologist:Michael Jena Gauss, MD   Chief Complaint   Chief Complaint  Patient presents with   Rectum Pain    States that it burns all the time but is worse when having a bm     History of Present Illness   Sabrina [name] is a 77 y.o. female presenting today at the request of Jannifer Rodney for further evaluation of rectal pain.  Patient has a history of CONCERN FOR colovaginal fistula treated by Dr. Romie Levee in December 2017.  At that time she presented with passing fecal material in her urine and/or vagina.  She had a CT scan with rectal contrast showing air between the rectum and the vagina concerning for colovaginal or rectovaginal fistula per Dr. Maisie Fus. She went to the operating room and NO colovaginal fistula found but she had a large rectocele present, minimal sphincter tone. States after her surgery in 2017, she did not have much problems with passing fecal material in her urine or vagina, things cleared up.    Today: three month history of recurrent UTIs. First two cultures with E.coli. Culture this month with Proteus mirabilis. Received Keflex all three times. Started with intermittent fecal incontinence last three months. Not every day. Has some stools with adequate control. When incontinent, stools are soft. Otherwise solid. Feels like hard to get cleaned up. Feels like stool coming from vagina too. Passing a lot of air "from the front". No stool in urine that she can tell. No blood in the stool. Stool is yellow/greenish. No melena or brbpr. No abdominal pain. She has constant rectal burning and intermittent rectal pressure.   Patient required remnant gastrectomy in March 2022 due to bleeding episode.  Completed EGD and colonoscopy without findings but CTA abdomen showed acute bleeding within the bypassed stomach.  Colonoscopy March 2022 at  [facility] health for GI bleeding: -Internal hemorrhoids -Diverticulosis  EGD March 2022 at [facility] for GI bleeding: -RYGB anatomy -No bleeding seen  CTA abdomen pelvis March 2022 at [facility] for GI bleeding: -Status post Roux-en-Y gastric bypass with acute bleeding within the bypassed stomach.  Colonoscopy May 2017 by Dr. Vonna Kotyk Pyrtle: -5 mm polyp in the ascending colon removed, tubular adenoma -Mild diverticulosis in the ascending colon-severe diverticulosis in the sigmoid colon, descending colon and at the splenic flexure -Distal rectum and anal verge normal -No evidence of fistula formation in the left colon but given degree of diverticulosis, fistula cannot be excluded  EGD May 2017 by Dr. Vonna Kotyk Pyrtle: -Normal esophagus -Roux-en-Y gastrojejunostomy with gastrojejunal anastomosis characterized by healthy-appearing mucosa and no stomal ulceration   Medications   Current Outpatient Medications  Medication Sig Dispense Refill   albuterol (VENTOLIN HFA) 108 (90 Base) MCG/ACT inhaler INHALE 2 PUFFS BY MOUTH EVERY 6 HOURS AS NEEDED FOR WHEEZE OR SHORTNESS OF BREATH 1 each 1   aspirin EC 81 MG EC tablet Take 1 tablet (81 mg total) by mouth daily. Swallow whole. 30 tablet 11   atorvastatin (LIPITOR) 40 MG tablet Take 1 tablet (40 mg total) by mouth daily. 90 tablet 3   blood glucose meter kit and supplies Dispense based on patient and insurance preference. Use up to four times daily as directed. (FOR ICD-10 E10.9, E11.9). 1 each 0   Blood Glucose Monitoring Suppl (TRUE METRIX AIR GLUCOSE METER) w/Device KIT Test BS daily Dx R73.09 1 kit  0   cromolyn (NASALCROM) 5.2 MG/ACT nasal spray Place 1 spray into both nostrils 2 (two) times daily as needed for allergies.     DULoxetine (CYMBALTA) 60 MG capsule TAKE 1 CAPSULE EVERY DAY 90 capsule 0   ferrous sulfate 325 (65 FE) MG tablet Take 325 mg by mouth once a week.     fluticasone (FLONASE) 50 MCG/ACT nasal spray SPRAY 2 SPRAYS INTO EACH  NOSTRIL EVERY DAY 48 mL 3   furosemide (LASIX) 20 MG tablet Take 1 tablet (20 mg total) by mouth daily. 90 tablet 1   gabapentin (NEURONTIN) 100 MG capsule 100 mg in AM and 300 mg in evening 360 capsule 1   glucose blood (TRUE METRIX BLOOD GLUCOSE TEST) test strip Test BS daily Dx R73.09 100 each 3   ILEVRO 0.3 % ophthalmic suspension Place 1 drop into the right eye daily.     moxifloxacin (VIGAMOX) 0.5 % ophthalmic solution Place 1 drop into the right eye 3 (three) times daily.     Multiple Vitamin (MULTIVITAMIN WITH MINERALS) TABS tablet Take 1 tablet by mouth in the morning.     mupirocin cream (BACTROBAN) 2 % Apply 1 Application topically 2 (two) times daily. 15 g 0   nitroGLYCERIN (NITROSTAT) 0.4 MG SL tablet Place 1 tablet (0.4 mg total) under the tongue every 5 (five) minutes x 3 doses as needed for chest pain. 25 tablet 12   Omega-3 Fatty Acids (FISH OIL PO) Take 1 capsule by mouth daily.     pantoprazole (PROTONIX) 40 MG tablet Take 1 tablet (40 mg total) by mouth 2 (two) times daily. 180 tablet 1   potassium chloride SA (KLOR-CON M) 20 MEQ tablet Take 1 tablet (20 mEq total) by mouth 2 (two) times daily. Take 1 tablet twice a day 180 tablet 1   prednisoLONE acetate (PRED FORTE) 1 % ophthalmic suspension Place 1 drop into the right eye 3 (three) times daily.     SYMBICORT 80-4.5 MCG/ACT inhaler Inhale 2 puffs into the lungs 2 (two) times daily. Take Two puffs into the lungs in the morning and 2 puff at bedtime 1 each 2   terconazole (TERAZOL 7) 0.4 % vaginal cream Place 1 applicator vaginally at bedtime. 45 g 0   triamcinolone ointment (KENALOG) 0.5 % Apply 1 application topically 2 (two) times daily. 30 g 0   TRUEplus Lancets 33G MISC Test BS daily Dx R73.09 100 each 3   vitamin C (ASCORBIC ACID) 500 MG tablet Take 500 mg by mouth daily.     Vitamin D, Ergocalciferol, (DRISDOL) 1.25 MG (50000 UNIT) CAPS capsule TAKE ONE CAPSULE (50,000 UNITS DOSE) BY MOUTH ONCE A WEEK. WEDNESDAY 12  capsule 1   No current facility-administered medications for this visit.    Allergies   Allergies as of 08/14/2023 - Review Complete 08/14/2023  Allergen Reaction Noted   Codeine Nausea And Vomiting 02/01/2014    Past Medical History   Past Medical History:  Diagnosis Date   Anemia    Ankle fracture, right    past hx. -"no surgery"   Anxiety    Asthma    CHF (congestive heart failure) (HCC) 2009   Chronic lower back pain    Collagen vascular disease (HCC)    COPD (chronic obstructive pulmonary disease) (HCC)    Depression    Fibromyalgia    GERD (gastroesophageal reflux disease)    Hyperlipidemia    Hypertension    Immature cataract of both eyes    Myocardial  infarction (HCC)    "I've had a light one; don't know when it happened" (08/27/2017)   Osteoarthritis    Peripheral neuropathy    legs and feet   Persistent atrial fibrillation (HCC)    Presence of Watchman left atrial appendage closure device 08/01/2021   24 mm Watchman FLXDevice LOT # [id number] by Dr. Lalla Brothers   Tremors of nervous system    noted in hands by pt last 6 months   Tubular adenoma of colon     Past Surgical History   Past Surgical History:  Procedure Laterality Date   APPENDECTOMY     AV NODE ABLATION N/A 08/27/2017   Procedure: AV NODE ABLATION;  Surgeon: [name], MD;  Location: [facility];  Service: Cardiovascular;  Laterality: N/A;   CATARACT EXTRACTION W/PHACO Right 03/16/2023   Procedure: CATARACT EXTRACTION PHACO AND INTRAOCULAR LENS PLACEMENT (IOC);  Surgeon: [name], MD;  Location: [facility];  Service: Ophthalmology;  Laterality: Right;  CDE: 10.62   CATARACT EXTRACTION W/PHACO Left 05/08/2023   Procedure: CATARACT EXTRACTION PHACO AND INTRAOCULAR LENS PLACEMENT (IOC);  Surgeon: [name], MD;  Location: [facility];  Service: Ophthalmology;  Laterality: Left;  CDE 11.28   CHOLECYSTECTOMY OPEN  1978   DILATION AND CURETTAGE OF UTERUS     FEMUR FRACTURE SURGERY Left 2013    "put 7" rod in it"   FRACTURE SURGERY     IVC FILTER REMOVAL N/A 06/20/2021   Procedure: IVC FILTER REMOVAL;  Surgeon: Cephus Shelling, MD;  Location: [facility];  Service: Cardiovascular;  Laterality: N/A;   LAPAROSCOPY  08/22/2016   Procedure: LAPAROSCOPY DIAGNOSTIC;  Surgeon: Romie Levee, MD;  Location: [facility];  Service: General;;   LEFT ATRIAL APPENDAGE OCCLUSION N/A 08/01/2021   Procedure: LEFT ATRIAL APPENDAGE OCCLUSION;  Surgeon: Lanier Prude, MD;  Location: [facility];  Service: Cardiovascular;  Laterality: N/A;   LEFT HEART CATH AND CORONARY ANGIOGRAPHY N/A 02/06/2022   Procedure: LEFT HEART CATH AND CORONARY ANGIOGRAPHY;  Surgeon: Lennette Bihari, MD;  Location: [facility];  Service: Cardiovascular;  Laterality: N/A;   MEDIAL PARTIAL KNEE REPLACEMENT Right 2005   "@ [facility]"   PACEMAKER IMPLANT N/A 08/27/2017   Procedure: PACEMAKER IMPLANT;  Surgeon: [name], MD;  Location: [facility];  Service: Cardiovascular;  Laterality: N/A;   ROUX-EN-Y GASTRIC BYPASS  2002   [facility]   SPLENECTOMY  2002   TEE WITHOUT CARDIOVERSION N/A 08/01/2021   Procedure: TRANSESOPHAGEAL ECHOCARDIOGRAM (TEE);  Surgeon: Lanier Prude, MD;  Location: [facility] INVASIVE CV LAB;  Service: Cardiovascular;  Laterality: N/A;   TONSILLECTOMY  1944   TUBAL LIGATION     VAGINAL HYSTERECTOMY      Past Family History   Family History  Problem Relation Age of Onset   Cancer Mother    Alzheimer's disease Mother    Heart disease Mother    Arthritis Father    Asthma Daughter    Arthritis Daughter    Obesity Daughter    Colon cancer Paternal Uncle 52   Arthritis Son    Hyperlipidemia Son    Obesity Son    Arthritis Son    Obesity Son    BRCA 1/2 Neg Hx     Past Social History   Social History   Socioeconomic History   Marital status: Divorced    Spouse name: Not on file   Number of children: 3  Years of education: 2 years of  college   Highest education level: Some college, no degree  Occupational History   Occupation: Retired  Tobacco Use   Smoking status: Former    Current packs/day: 0.00    Average packs/day: 0.5 packs/day for 25.0 years (12.5 ttl pk-yrs)    Types: Cigarettes    Start date: 02/27/1968    Quit date: 02/26/1993    Years since quitting: 30.4   Smokeless tobacco: Never  Vaping Use   Vaping status: Never Used  Substance and Sexual Activity   Alcohol use: Not Currently    Comment: 08/27/2017 "nothing since early 2000s"   Drug use: Not Currently   Sexual activity: Not Currently    Birth control/protection: Surgical  Other Topics Concern   Not on file  Social History Narrative   ** Merged History Encounter **       Pt is right handed Lives in single story home with her grandson Has 3 adult children Associated degree  Retired Lawyer   Social Determinants of Corporate investment banker Strain: Low Risk  (03/04/2023)   Overall Financial Resource Strain (CARDIA)    Difficulty of Paying Living Expenses: Not hard at all  Food Insecurity: No Food Insecurity (03/04/2023)   Hunger Vital Sign    Worried About Running Out of Food in the Last Year: Never true    Ran Out of Food in the Last Year: Never true  Transportation Needs: No Transportation Needs (03/04/2023)   PRAPARE - Administrator, Civil Service (Medical): No    Lack of Transportation (Non-Medical): No  Physical Activity: Insufficiently Active (03/04/2023)   Exercise Vital Sign    Days of Exercise per Week: 3 days    Minutes of Exercise per Session: 30 min  Stress: No Stress Concern Present (03/04/2023)   [employer] of Occupational Health - Occupational Stress Questionnaire    Feeling of Stress : Not at all  Social Connections: Moderately Integrated (03/04/2023)   Social Connection and Isolation Panel [NHANES]    Frequency of Communication with Friends and Family: More than three times a week    Frequency of Social  Gatherings with Friends and Family: More than three times a week    Attends Religious Services: More than 4 times per year    Active Member of [employer] or Organizations: Yes    Attends Engineer, structural: More than 4 times per year    Marital Status: Divorced  Intimate Partner Violence: Not At Risk (03/04/2023)   Humiliation, Afraid, Rape, and Kick questionnaire    Fear of Current or Ex-Partner: No    Emotionally Abused: No    Physically Abused: No    Sexually Abused: No    Review of Systems   General: Negative for anorexia, weight loss, fever, chills, fatigue, weakness. Eyes: Negative for vision changes.  ENT: Negative for hoarseness, difficulty swallowing , nasal congestion. CV: Negative for chest pain, angina, palpitations, dyspnea on exertion, peripheral edema.  Respiratory: Negative for dyspnea at rest, dyspnea on exertion, cough, sputum, wheezing.  GI: See history of present illness. GU:  Negative for  hematuria, urinary incontinence, frequent uti/dysuria MS: Negative for joint pain, low back pain.  Derm: Negative for rash or itching.  Neuro: Negative for weakness, abnormal sensation, seizure, frequent headaches, memory loss,  confusion.  Psych: Negative for anxiety, depression, suicidal ideation, hallucinations.  Endo: Negative for unusual weight change.  Heme: Negative for bruising or bleeding. Allergy: Negative for rash or  hives.  Physical Exam   BP 128/81 (BP Location: Right Arm, Patient Position: Sitting, Cuff Size: Normal)   Pulse 73   Temp (!) 96.3 F (35.7 C) (Oral)   Ht 5\' 4"  (1.626 m)   Wt 138 lb 3.2 oz (62.7 kg)   SpO2 97%   BMI 23.72 kg/m    General: Well-nourished, well-developed in no acute distress.  Head: Normocephalic, atraumatic.   Eyes: Conjunctiva pink, no icterus. Mouth: Oropharyngeal mucosa moist and pink   Neck: Supple without thyromegaly, masses, or lymphadenopathy.  Lungs: Clear to auscultation bilaterally.  Heart: Regular rate  and rhythm, no murmurs rubs or gallops.  Abdomen: Bowel sounds are normal, nontender, nondistended, no hepatosplenomegaly or masses,  no abdominal bruits or hernia, no rebound or guarding.   Rectal: no stool inside the vaginal introitus which was wide open, small amount of soft light brown stool noted at the perianal are. Skin tags notes externally. Weak anal tone. No masses in rectal. Large amount of soft stool in rectal vault with very minimal discomfort noted on DRE. Stool hemoccult negative.  Extremities: No lower extremity edema. No clubbing or deformities.  Neuro: Alert and oriented x 4 , grossly normal neurologically.  Skin: Warm and dry, no rash or jaundice.   Psych: Alert and cooperative, normal mood and affect.  Labs   Lab Results  Component Value Date   NA 138 07/05/2023   CL 99 07/05/2023   K 4.2 07/05/2023   CO2 28 07/05/2023   BUN 19 07/05/2023   CREATININE 0.72 07/05/2023   GFRNONAA >60 07/05/2023   CALCIUM 9.3 07/05/2023   ALBUMIN 3.6 07/05/2023   GLUCOSE 99 07/05/2023   Lab Results  Component Value Date   ALT 17 07/05/2023   AST 20 07/05/2023   GGT 8 09/30/2019   ALKPHOS 91 07/05/2023   BILITOT 1.0 07/05/2023   Lab Results  Component Value Date   WBC 9.9 07/05/2023   HGB 13.4 07/05/2023   HCT 42.3 07/05/2023   MCV 89.8 07/05/2023   PLT 341 07/05/2023   Lab Results  Component Value Date   TSH 2.840 03/24/2023    Imaging Studies   CUP PACEART INCLINIC DEVICE CHECK  Result Date: 07/24/2023 Pacemaker check in clinic. Normal device function. Thresholds, sensing, impedances consistent with previous measurements. Device programmed to maximize longevity. No mode switch or high ventricular rates noted. Device programmed at appropriate safety margins. Histogram distribution appropriate for patient activity level. Device programmed to optimize intrinsic conduction. Estimated longevity 3.2 years. Patient enrolled in remote follow-up. Patient education  completed.Jeannine Kitten, RN   Assessment/Plan:   Question of rectal fistula, possible colovaginal or colovesical fistula: patient reports stool with urination unclear if coming from bladder or vagina. Had extensive evaluation for this in 2017 with CT findings suggestive of colovaginal fistula but at time of surgery none was found. Patient did have large rectocele and weak anal tone. Now presenting with similar symptoms along with recurrent UTIs like before.   Fecal incontinence at times without her knowledge, we may also be making it difficult to delineate between possible fistula symptoms. She also has trouble cleaning up after a BM, with large perianal skin tags present. Stool very soft on exam, she may benefit from firming up her stool a bit so that she has better control.   H/o adenomatous colon polyp in 2017.    PLAN   Imodium 1mg  daily. Hold if stool become hard or straining required. CT pelvis with contrast/rectal contrast as well.  Urine culture. May benefit from colonoscopy due to history of adenomatous colon polyps and based on clinical course.    Leanna Battles. Melvyn Neth, MHS, PA-C [facility]

## 2023-08-14 NOTE — Telephone Encounter (Signed)
Pt informed of CT appt date, time and location.

## 2023-08-14 NOTE — Telephone Encounter (Signed)
[employer] PA: Approved Authorization #[id number]  Tracking #[id number] Dates of service 08/14/2023 - 10/13/2023

## 2023-08-14 NOTE — Telephone Encounter (Signed)
[facility]  CT scheduled for Monday , 08/24/23, arrive at 12:15 pm to check in at [facility]

## 2023-08-17 ENCOUNTER — Other Ambulatory Visit (HOSPITAL_COMMUNITY): Payer: Medicare HMO

## 2023-08-18 ENCOUNTER — Other Ambulatory Visit: Payer: Self-pay | Admitting: Gastroenterology

## 2023-08-18 ENCOUNTER — Ambulatory Visit: Payer: Medicare HMO | Admitting: Gastroenterology

## 2023-08-18 LAB — URINE CULTURE

## 2023-08-18 MED ORDER — NITROFURANTOIN MONOHYD MACRO 100 MG PO CAPS: 100.0000 mg | ORAL_CAPSULE | Freq: Two times a day (BID) | ORAL | 0 refills | Status: AC

## 2023-08-19 ENCOUNTER — Other Ambulatory Visit: Payer: Self-pay | Admitting: *Deleted

## 2023-08-19 DIAGNOSIS — N390 Urinary tract infection, site not specified: Secondary | ICD-10-CM

## 2023-08-24 ENCOUNTER — Ambulatory Visit (HOSPITAL_COMMUNITY)
Admission: RE | Admit: 2023-08-24 | Discharge: 2023-08-24 | Disposition: A | Discharge status: Home / Self Care | Payer: Medicare HMO | Source: Ambulatory Visit | Attending: Gastroenterology | Admitting: Gastroenterology

## 2023-08-24 DIAGNOSIS — N838 Other noninflammatory disorders of ovary, fallopian tube and broad ligament: Secondary | ICD-10-CM | POA: Diagnosis not present

## 2023-08-24 DIAGNOSIS — N824 Other female intestinal-genital tract fistulae: Secondary | ICD-10-CM | POA: Insufficient documentation

## 2023-08-24 DIAGNOSIS — N390 Urinary tract infection, site not specified: Secondary | ICD-10-CM | POA: Insufficient documentation

## 2023-08-24 DIAGNOSIS — N823 Fistula of vagina to large intestine: Secondary | ICD-10-CM | POA: Diagnosis not present

## 2023-08-24 DIAGNOSIS — N83292 Other ovarian cyst, left side: Secondary | ICD-10-CM | POA: Diagnosis not present

## 2023-08-24 DIAGNOSIS — K5730 Diverticulosis of large intestine without perforation or abscess without bleeding: Secondary | ICD-10-CM | POA: Diagnosis not present

## 2023-08-24 LAB — POCT I-STAT CREATININE: Creatinine, Ser: 0.9 mg/dL (ref 0.44–1.00)

## 2023-08-24 MED ORDER — IOHEXOL 300 MG/ML  SOLN
100.0000 mL | Freq: Once | INTRAMUSCULAR | Status: AC | PRN
  Administered 2023-08-24: 100 mL via INTRAVENOUS

## 2023-08-27 ENCOUNTER — Telehealth (INDEPENDENT_AMBULATORY_CARE_PROVIDER_SITE_OTHER): Payer: Self-pay | Admitting: *Deleted

## 2023-08-27 NOTE — Telephone Encounter (Signed)
Called about her results from CT--looked and they are not transcribed yet --called patient and let her know we have not as to date received those --either we will call with results or she can call back next week to see if back yet.

## 2023-09-10 DIAGNOSIS — N390 Urinary tract infection, site not specified: Secondary | ICD-10-CM | POA: Insufficient documentation

## 2023-09-10 NOTE — Progress Notes (Signed)
Name: Sabrina [name] DOB: 1945-09-27 MRN: [id number]  History of Present Illness: Ms. Detoro is a 77 y.o. female who presents today as a new patient at [facility] . All available relevant medical records have been reviewed.   Today: She reports chief complaint of recurrent UTls.  Urine culture results in past 12 months: - 04/24/2023: Positive for E. Coli; treated with Keflex - 07/05/2023: Positive for E. Coli; treated with Cefpodoxime Varney Baas) - 08/06/2023: Positive for Proteus mirabilis; treated with Keflex - 08/14/2023: Positive for E. Coli MDRO; treated with Macrobid  Per chart review: > May 2017: Colonoscopy showed multiple diverticula but no signs of diverticulitis.   > 05/30/2016: CT scan with rectal contrast showed air between the rectum and the vagina concerning for a colovaginal or rectovaginal fistula.   > 08/22/2016: Underwent diagnostic laparoscopy, rectal exam under anesthesia with rigid proctoscopy by Dr. Maisie Fus. Patient had reported passing fecal matter in her urine and left-sided abdominal pain for 3-4 years. Upon exam in OR there was no evidence of colovaginal fistula. Large rectocele identified and minimal anal sphincter tone.  > March 2022: Colonoscopy at [facility] for GI bleeding showed internal hemorrhoids and diverticulosis.   > 08/14/2023:  - Seen by GI provider Luretha Rued, PA).  - Having intermittent fecal incontinence and "Feels like stool coming from vagina too. Passing a lot of air "from the front". No stool in urine that she can tell. No blood in the stool. Stool is yellow/greenish. No melena or brbpr. No abdominal pain. She has constant rectal burning and intermittent rectal pressure." - The plan was: Imodium 1mg  daily. Hold if stool become hard or straining required. CT pelvis with contrast/rectal contrast as well. Urine culture. May benefit from colonoscopy due to history of adenomatous colon polyps and based on clinical  course.  > 08/24/2023: Per CT pelvis with IV & rectal contrast: - "The visualized distal ureters and bladder appear unremarkable. Specifically, no gas or contrast within the bladder lumen. No bladder wall thickening." - "No definite fistula from the colon or rectum identified." - "Status post hysterectomy. As before, there is air within the vagina. Difficult to exclude a small amount of contrast within the vaginal lumen, although this may relate to enhancement from the administered intravenous contrast.  Cystic appearing left ovarian lesion measures 2.4 x 1.7 cm on image 69/2, enlarged from previous CT at which time this measured 1.0 cm maximally."  Urinary Symptoms: She reports >4 UTl's in the last year. When present, UTI symptoms include chills/sweats, confusion, increased thirst, dysuria, increased urinary urgency, frequency, and sometimes nausea. Denies fevers. Has been eating cranberries, eating yogurt (for probiotic effect), taking a probiotic supplement to try to prevent UTIs. She reports intermittently seeing "little flecks" in her urine - she is unsure if that is stool, blood, or something else.  She reports mild acute UTI symptoms today.  At baseline: She denies urinary urgency, frequency, dysuria, gross hematuria, , straining to void, or sensations of incomplete emptying. Reports intermittent urinary hesitancy. She denies pushing on a bulge in order to empty her bladder.  She reports distant history of pyelonephritis in childhood.  She denies history of kidney stones.  Vaginal / prolapse Symptoms: She reports seeing "stool and pus" come out through her vagina since 2017, states what comes out is sometimes green or yellow and is "very foul smelling". She also reports feeling like she is passing gas through her vagina. She denies having this evaluated by GYN at any point.  She denies vaginal pain, bleeding, or vaginal bulge sensation.  She denies seeing a vaginal bulge.  Denies  being sexually active. She had a vaginal hysterectomy "years ago".  She previously took Terconazole 0.4% vaginal cream and states that was helpful "felt better inside" but is now out of that. Denies prior or current use of topical vaginal estrogen cream.   Fall Screening: Do you usually have a device to assist in your mobility? No   Medications: Current Outpatient Medications  Medication Sig Dispense Refill   albuterol (VENTOLIN HFA) 108 (90 Base) MCG/ACT inhaler INHALE 2 PUFFS BY MOUTH EVERY 6 HOURS AS NEEDED FOR WHEEZE OR SHORTNESS OF BREATH 1 each 1   aspirin EC 81 MG EC tablet Take 1 tablet (81 mg total) by mouth daily. Swallow whole. 30 tablet 11   blood glucose meter kit and supplies Dispense based on patient and insurance preference. Use up to four times daily as directed. (FOR ICD-10 E10.9, E11.9). 1 each 0   Blood Glucose Monitoring Suppl (TRUE METRIX AIR GLUCOSE METER) w/Device KIT Test BS daily Dx R73.09 1 kit 0   cromolyn (NASALCROM) 5.2 MG/ACT nasal spray Place 1 spray into both nostrils 2 (two) times daily as needed for allergies.     DULoxetine (CYMBALTA) 60 MG capsule TAKE 1 CAPSULE EVERY DAY 90 capsule 0   estradiol (ESTRACE) 0.1 MG/GM vaginal cream Place 1 Applicatorful vaginally at bedtime. 42.5 g 12   ferrous sulfate 325 (65 FE) MG tablet Take 325 mg by mouth once a week.     fluticasone (FLONASE) 50 MCG/ACT nasal spray SPRAY 2 SPRAYS INTO EACH NOSTRIL EVERY DAY 48 mL 3   fosfomycin (MONUROL) 3 g PACK Take 3 g by mouth once a week. 4 each 5   furosemide (LASIX) 20 MG tablet Take 1 tablet (20 mg total) by mouth daily. **NEEDS TO BE SEEN BEFORE NEXT REFILL** 30 tablet 0   gabapentin (NEURONTIN) 100 MG capsule 100 mg in AM and 300 mg in evening 360 capsule 1   glucose blood (TRUE METRIX BLOOD GLUCOSE TEST) test strip Test BS daily Dx R73.09 100 each 3   ILEVRO 0.3 % ophthalmic suspension Place 1 drop into the right eye daily.     moxifloxacin (VIGAMOX) 0.5 % ophthalmic  solution Place 1 drop into the right eye 3 (three) times daily.     Multiple Vitamin (MULTIVITAMIN WITH MINERALS) TABS tablet Take 1 tablet by mouth in the morning.     mupirocin cream (BACTROBAN) 2 % Apply 1 Application topically 2 (two) times daily. 15 g 0   nitroGLYCERIN (NITROSTAT) 0.4 MG SL tablet Place 1 tablet (0.4 mg total) under the tongue every 5 (five) minutes x 3 doses as needed for chest pain. 25 tablet 12   Omega-3 Fatty Acids (FISH OIL PO) Take 1 capsule by mouth daily.     pantoprazole (PROTONIX) 40 MG tablet Take 1 tablet (40 mg total) by mouth 2 (two) times daily. **NEEDS TO BE SEEN BEFORE NEXT REFILL** 60 tablet 0   potassium chloride SA (KLOR-CON M) 20 MEQ tablet Take 1 tablet (20 mEq total) by mouth 2 (two) times daily. Take 1 tablet twice a day 180 tablet 1   prednisoLONE acetate (PRED FORTE) 1 % ophthalmic suspension Place 1 drop into the right eye 3 (three) times daily.     SYMBICORT 80-4.5 MCG/ACT inhaler INHALE TWO PUFFS INTO THE LUNGS IN THE MORNING AND 2 PUFFS AT BEDTIME 30.6 each 0   triamcinolone ointment (  KENALOG) 0.5 % Apply 1 application topically 2 (two) times daily. 30 g 0   TRUEplus Lancets 33G MISC Test BS daily Dx R73.09 100 each 3   vitamin C (ASCORBIC ACID) 500 MG tablet Take 500 mg by mouth daily.     Vitamin D, Ergocalciferol, (DRISDOL) 1.25 MG (50000 UNIT) CAPS capsule TAKE ONE CAPSULE (50,000 UNITS DOSE) BY MOUTH ONCE A WEEK. WEDNESDAY 12 capsule 1   atorvastatin (LIPITOR) 40 MG tablet Take 1 tablet (40 mg total) by mouth daily. 90 tablet 3   terconazole (TERAZOL 7) 0.4 % vaginal cream Place 1 applicator vaginally at bedtime. (Patient not taking: Reported on 09/21/2023) 45 g 0   No current facility-administered medications for this visit.    Allergies: Allergies  Allergen Reactions   Codeine Nausea And Vomiting    Stomach pain also    Past Medical History:  Diagnosis Date   Anemia    Ankle fracture, right    past hx. -"no surgery"   Anxiety     Asthma    CHF (congestive heart failure) (HCC) 2009   Chronic lower back pain    Collagen vascular disease (HCC)    COPD (chronic obstructive pulmonary disease) (HCC)    Depression    Fibromyalgia    GERD (gastroesophageal reflux disease)    Hyperlipidemia    Hypertension    Immature cataract of both eyes    Myocardial infarction (HCC)    "I've had a light one; don't know when it happened" (08/27/2017)   Osteoarthritis    Peripheral neuropathy    legs and feet   Persistent atrial fibrillation (HCC)    Presence of Watchman left atrial appendage closure device 08/01/2021   24 mm Watchman FLXDevice LOT # [id number] by Dr. Lalla Brothers   Tremors of nervous system    noted in hands by pt last 6 months   Tubular adenoma of colon    Past Surgical History:  Procedure Laterality Date   APPENDECTOMY     AV NODE ABLATION N/A 08/27/2017   Procedure: AV NODE ABLATION;  Surgeon: [name], MD;  Location: [facility];  Service: Cardiovascular;  Laterality: N/A;   CATARACT EXTRACTION W/PHACO Right 03/16/2023   Procedure: CATARACT EXTRACTION PHACO AND INTRAOCULAR LENS PLACEMENT (IOC);  Surgeon: [name], MD;  Location: [facility];  Service: Ophthalmology;  Laterality: Right;  CDE: 10.62   CATARACT EXTRACTION W/PHACO Left 05/08/2023   Procedure: CATARACT EXTRACTION PHACO AND INTRAOCULAR LENS PLACEMENT (IOC);  Surgeon: [name], MD;  Location: [facility];  Service: Ophthalmology;  Laterality: Left;  CDE 11.28   CHOLECYSTECTOMY OPEN  1978   DILATION AND CURETTAGE OF UTERUS     FEMUR FRACTURE SURGERY Left 2013   "put 7" rod in it"   FRACTURE SURGERY     IVC FILTER REMOVAL N/A 06/20/2021   Procedure: IVC FILTER REMOVAL;  Surgeon: Cephus Shelling, MD;  Location: [facility];  Service: Cardiovascular;  Laterality: N/A;   LAPAROSCOPY  08/22/2016   Procedure: LAPAROSCOPY DIAGNOSTIC;  Surgeon: Romie Levee, MD;  Location: [facility];  Service: General;;   LEFT ATRIAL APPENDAGE OCCLUSION  N/A 08/01/2021   Procedure: LEFT ATRIAL APPENDAGE OCCLUSION;  Surgeon: Lanier Prude, MD;  Location: [facility];  Service: Cardiovascular;  Laterality: N/A;   LEFT HEART CATH AND CORONARY ANGIOGRAPHY N/A 02/06/2022   Procedure: LEFT HEART CATH AND CORONARY ANGIOGRAPHY;  Surgeon: Lennette Bihari, MD;  Location: [facility];  Service: Cardiovascular;  Laterality: N/A;   MEDIAL PARTIAL KNEE REPLACEMENT Right 2005   "@ [facility]"   PACEMAKER IMPLANT N/A 08/27/2017   Procedure: PACEMAKER IMPLANT;  Surgeon: [name], MD;  Location: [facility];  Service: Cardiovascular;  Laterality: N/A;   ROUX-EN-Y GASTRIC BYPASS  2002   [facility]   SPLENECTOMY  2002   TEE WITHOUT CARDIOVERSION N/A 08/01/2021   Procedure: TRANSESOPHAGEAL ECHOCARDIOGRAM (TEE);  Surgeon: Lanier Prude, MD;  Location: [facility];  Service: Cardiovascular;  Laterality: N/A;   TONSILLECTOMY  1944   TUBAL LIGATION     VAGINAL HYSTERECTOMY     Family History  Problem Relation Age of Onset   Cancer Mother    Alzheimer's disease Mother    Heart disease Mother    Arthritis Father    Asthma Daughter    Arthritis Daughter    Obesity Daughter    Colon cancer Paternal Uncle 66   Arthritis Son    Hyperlipidemia Son    Obesity Son    Arthritis Son    Obesity Son    BRCA 1/2 Neg Hx    Social History   Socioeconomic History   Marital status: Divorced    Spouse name: Not on file   Number of children: 3   Years of education: 2 years of college   Highest education level: Some college, no degree  Occupational History   Occupation: Retired  Tobacco Use   Smoking status: Former    Current packs/day: 0.00    Average packs/day: 0.5 packs/day for 25.0 years (12.5 ttl pk-yrs)    Types: Cigarettes    Start date: 02/27/1968    Quit date: 02/26/1993    Years since quitting: 30.5   Smokeless tobacco: Never  Vaping Use   Vaping status: Never Used  Substance and Sexual Activity    Alcohol use: Not Currently    Comment: 08/27/2017 "nothing since early 2000s"   Drug use: Not Currently   Sexual activity: Not Currently    Birth control/protection: Surgical  Other Topics Concern   Not on file  Social History Narrative   ** Merged History Encounter **       Pt is right handed Lives in single story home with her grandson Has 3 adult children Associated degree  Retired Lawyer   Social Drivers of Corporate investment banker Strain: Low Risk  (03/04/2023)   Overall Financial Resource Strain (CARDIA)    Difficulty of Paying Living Expenses: Not hard at all  Food Insecurity: No Food Insecurity (03/04/2023)   Hunger Vital Sign    Worried About Running Out of Food in the Last Year: Never true    Ran Out of Food in the Last Year: Never true  Transportation Needs: No Transportation Needs (03/04/2023)   PRAPARE - Administrator, Civil Service (Medical): No    Lack of Transportation (Non-Medical): No  Physical Activity: Insufficiently Active (03/04/2023)   Exercise Vital Sign    Days of Exercise per Week: 3 days    Minutes of Exercise per Session: 30 min  Stress: No Stress Concern Present (03/04/2023)   [employer] of Occupational Health - Occupational Stress Questionnaire    Feeling of Stress : Not at all  Social Connections: Moderately Integrated (03/04/2023)   Social Connection and Isolation Panel [NHANES]    Frequency of Communication with Friends and Family: More than three times a week    Frequency of Social Gatherings with Friends and Family:  More than three times a week    Attends Religious Services: More than 4 times per year    Active Member of Clubs or Organizations: Yes    Attends Banker Meetings: More than 4 times per year    Marital Status: Divorced  Intimate Partner Violence: Not At Risk (03/04/2023)   Humiliation, Afraid, Rape, and Kick questionnaire    Fear of Current or Ex-Partner: No    Emotionally Abused: No    Physically  Abused: No    Sexually Abused: No    SUBJECTIVE  Review of Systems Constitutional: Patient denies any unintentional weight loss or change in strength lntegumentary: Patient denies any rashes or pruritus Cardiovascular: Patient denies chest pain or syncope Respiratory: Patient denies shortness of breath Gastrointestinal: Per HPI; also reports occasional fecal incontinence Musculoskeletal: Patient denies muscle cramps or weakness Neurologic: Patient denies convulsions or seizures Allergic/Immunologic: Patient denies recent allergic reaction(s) Hematologic/Lymphatic: Patient denies bleeding tendencies Endocrine: Patient denies heat/cold intolerance  GU: As per HPI.  OBJECTIVE Vitals:   09/21/23 0945  BP: 135/83  Pulse: 79  Temp: 97.6 F (36.4 C)   There is no height or weight on file to calculate BMI.  Physical Examination Constitutional: No obvious distress; patient is non-toxic appearing  Cardiovascular: No visible lower extremity edema.  Respiratory: The patient does not have audible wheezing/stridor; respirations do not appear labored  Gastrointestinal: Abdomen non-distended Musculoskeletal: Normal ROM of UEs  Skin: No obvious rashes/open sores  Neurologic: CN 2-12 grossly intact Psychiatric: Answered questions appropriately with normal affect  Hematologic/Lymphatic/Immunologic: No obvious bruises or sites of spontaneous bleeding  Urine microscopy: 6-10 WBC/hpf, otherwise unremarkable PVR: 0 ml  ASSESSMENT Recurrent UTI - Plan: Urinalysis, Routine w reflex microscopic, BLADDER SCAN AMB NON-IMAGING, Ambulatory referral to Gynecology, fosfomycin (MONUROL) 3 g PACK, Urine Culture, estradiol (ESTRACE) 0.1 MG/GM vaginal cream  Vaginal flatus - Plan: Ambulatory referral to Gynecology  Foul smelling vaginal discharge - Plan: Ambulatory referral to Gynecology  Rectocele - Plan: Ambulatory referral to Gynecology  Left ovarian cyst - Plan: Ambulatory referral to  Gynecology  Pneumatosis vaginalis - Per CT pelvis with IV & rectal contrast on 08/24/2023 - Plan: Ambulatory referral to Gynecology  Vaginal flatus and abnormal vaginal discharge: Low suspicion for rectovaginal fistula based on prior GI workup & CT imaging, however she was strongly advised to establish care with Gynecology for a thorough workup including evaluation of enlarging left ovarian cyst, pneumotosis vaginalis (with vaginal flatus) per CT pelvis on 08/24/2023, and abnormal vaginal discharge. Pt verbalized understanding and agreement; GYN referral placed. She was made aware that based on GYN provider findings further evaluation by Colorectal specialist may be advised.  2. Large rectocele. Asymptomatic per patient.  3. Recurrent UTls:  We discussed the possible etiologies of recurrent UTls including ascending infection related to intercourse; vaginal atrophy; transmural infection that has been treated incompletely; urinary tract stones; incomplete bladder emptying with urinary stasis; kidney or bladder tumor; urethral diverticulum; and colonization of  vagina and urinary tract with pathologic, adherent organisms.   UA only mildly abnormal however will send for urine culture based on current symptoms and treat as indicated based on results.  For UTI prevention we discussed the following options, for which detailed information has been included in Patient Information section of today's After Visit Summary. > Maintain adequate fluid intake daily to flush out the urinary tract. > Topical vaginal estrogen for vaginal atrophy.  We discussed the following: - The etiology and consequences of urogenital epithelial atrophy  was explained to patient. The thinning of the epithelium of the urethra can contribute to urinary urgency and frequency syndromes. In addition, the normal bacterial flora that colonizes the perineum may contribute to UTI risk because the thin urethral epithelium allows the bacteria to  become adherent and the change in vaginal pH can disrupt the vaginal / urethral microbiome and allow for bacterial overgrowth. - The normal bacterial flora that colonizes the perineum may contribute to UTI risk because the thin urethral epithelium allows the bacteria to become adherent and the change in vaginal pH can disrupt the vaginal / urethral microbiome and allow for bacterial overgrowth.  - Topical vaginal estrogen replacement will take about 3 months to restore the vaginal pH. - There have been studies that evaluate use of low-dose intravaginal estrogen cream that shows minimal systemic absorption that is negligible after 3 weeks. There have been no studies indicating that use of topical vaginal estrogen increases a patient's risk of contributing to breast cancer development or recurrence. > UTI prophylaxis with a daily low dose antibiotic. We discussed the potential risks of prolonged antibiotic treatment particularly with the risks of developing antibiotic resistance. Based on review of recent urine culture sensitivities we agreed to start Fosfomcyin 3 grams once per week. If Fosfomycin is too expensive / not covered by her insurance, will plan to use Hiprex as alternative.  Advised cystoscopy for further evaluation to rule out colovesical fistula due to recurrent UTIs and report of "flecks" in urine re: possible fecaluria.   Pt verbalized understanding and agreement. All questions were answered.   PLAN Advised the following: 1. Urine culture. 2. Fosfomcyin 3 grams once per week. 3. Topical vaginal estrogen cream as prescribed. 4. Maintain adequate fluid intake daily to flush out the urinary tract. 5. Consider OTC supplements for UTI prevention. 6. Return for 1st available cystoscopy with any urology MD. 7. Urgent GYN referral placed.  Orders Placed This Encounter  Procedures   Urine Culture   Urinalysis, Routine w reflex microscopic   Ambulatory referral to Gynecology    Referral  Priority:   Urgent    Referral Type:   Consultation    Referral Reason:   Specialty Services Required    Requested Specialty:   Gynecology    Number of Visits Requested:   1   BLADDER SCAN AMB NON-IMAGING    It has been explained that the patient is to follow regularly with their PCP in addition to all other providers involved in their care and to follow instructions provided by these respective offices. Patient advised to contact urology clinic if any urologic-pertaining questions, concerns, new symptoms or problems arise in the interim period.  Patient Instructions  Recommendations regarding UTI prevention / management:  Options when UTI symptoms occur: 1. Call [facility] [place] to request urgent / same-day visit (phone # [phone]).  2. Call your Primary Care Provider (PCP) office to request urgent / same-day visit. Be sure to request for urine culture to be ordered and have results faxed to Urology (fax # [phone]).  3. Go to urgent care. Be sure to request for urine culture to be ordered and have results faxed to Urology (fax # [phone]).   For bladder pain/ burning with urination: - Can take OTC Pyridium (phenazopyridine; commonly known under the "AZO" brand) for a few days as needed. Limit use to no more than 3 days consecutively due to risk for methemoglobinemia, liver function issues, and bone health damage with long term use of Pyridium.  Routine use for UTI prevention: - Topical vaginal estrogen for vaginal atrophy. - Adequate daily fluid intake to flush out the urinary tract. - Go to the bathroom to urinate every 4-6 hours while awake to minimize urinary stasis / bacterial overgrowth in the bladder. - Proanthocyanidin (PAC) supplement 36 mg daily; must be soluble (insoluble form of PAC will be ineffective). Recommended brand: Ellura. This is an over-the-counter supplement (often must be found/ purchased online) supplement derived from cranberries with  concentrated active component: Proanthocyanidin (PAC) 36 mg daily. Decreases bacterial adherence to bladder lining.  - D-mannose powder (2 grams daily). This is an over-the-counter supplement which decreases bacterial adherence to bladder lining (it is a sugar that inhibits bacterial adherence to urothelial cells by binding to the pili of enteric bacteria). Take as per manufacturer recommendation. Can be used as an alternative or in addition to the concentrated cranberry supplement.  - Vitamin C supplement to acidify urine to minimize bacterial growth.  - Probiotic to maintain healthy vaginal microbiome to suppress bacteria at urethral opening. Brand recommendations: Darrold Junker (includes probiotic & D-mannose ), Feminine Balance (highest concentration of lactobacillus) or Hyperbiotic Pro 15.  Note for patients with diabetes:  - Be aware that D-mannose contains sugar.  Note for patients with interstitial cystitis (IC):  - Patients with IC should typically avoid cranberry/ PAC supplements and Vitamin C supplements due to their acidity, which may exacerbate IC-related bladder pain. - Symptoms of true bacterial UTI can overlap / mimic symptoms of an IC flare up. Antibiotic use is NOT indicated for IC flare ups. Urine culture needed prior to antibiotic treatment for IC patients. The goal is to minimize your risk for developing antibiotic-resistant bacteria.    Vulvovaginal atrophy I Genitourinary syndrome of menopause (GSM):  What it is: Changes in the vaginal environment (including the vulva and urethra) including: Thinning of the epithelium (skin/ mucosa surface) Can contribute to urinary urgency and frequency Can contribute to dryness, itching, irritation of the vulvar and vaginal tissue Can contribute to pain with intercourse Can contribute to physical changes of the labia, vulva, and vagina such as: Narrowing of the vaginal opening Decreased vaginal length Loss of labial architecture Labial  adhesions Pale color of vulvovaginal tissue  Loss of pubic hair Allows bacteria to become adherent  Results in increased risk for urinary tract infection (UTI) due to bacterial overgrowth and migration up the urethra into the bladder Change in vaginal pH (acid/ base balance) Allows for alteration / disruption of the normal bacterial flora / microbiome Results in increased risk for urinary tract infection (UTI) due to bacterial overgrowth  Treatment options: Over-the-counter lubricants (see list below). Prescription vaginal estrogen replacement. Options: Topical vaginal estrogen cream Estrace, Premarin, or compounded estradiol cream/ gel We advise: Discard plastic applicator as that tends to use more medication than you need, which is not harmful but wastes / uses up the medication. Also the plastic applicator may cause discomfort. Insert blueberry size amount of medication via the tip of your finger inside vagina nightly for 1 week then 2-3 times per week (long term). Estring vaginal ring Exchanged every 3 months (either at home or in office by provider) Vagifem vaginal tablet Inserted nightly for 2 weeks then twice a week (long term) lntrarosa vaginal suppository Vaginal DHEA: converts to estrogen in vaginal tissue without systemic effect Inserted nightly (long term) Vaginal laser therapy (Mona Lisa touch) Performed in 3 treatments each 6 weeks apart (available in our [place] office). Can feel like a sunburn for 3-4  days after each treatment until new skin heals in. Usually not covered by insurance. Estimated cost is $1500 for all 3 sessions.  FYI regarding prescription vaginal estrogen treatment options: All topical vaginal estrogen replacement options are equivalent in terms of efficacy. Topical vaginal estrogen replacement will take about 3 months to be effective. OK to have sex with any of the topical vaginal estrogen replacement options. Topical vaginal estrogen replacement  may sting/burn initially due to severe dryness, which will improve with ongoing treatment. There have been studies that evaluate use of low-dose intravaginal estrogen that show minimal systemic absorption which is negligible after 3 weeks. There have been no studies indicating increased risk of contributing to cancer development or recurrence.  Topical vaginal estrogen cream safe to use with breast cancer history [email or web address]  Topical vaginal estrogen cream safe to use with blood clot history [email or web address]   Lubricants and Moisturizers for Treating Genitourinary Syndrome of Menopause and Vulvovaginal Atrophy Treatment Comments I Available Products   Lubricants   Water-based Ingredients: Deionized water, glycerin, propylene glycol; latex safe; rare irritation; dry out with extended sexual activity Astroglide, Good Clean Love, K-Y Jelly, Natural, Organic, Pink, Sliquid, Sylk, Yes    Oil Based Ingredients: avocado, olive, peanut, corn; latex safe; can be used with silicone products; staining; safe (unless peanut allergy); non-irritating Coconut oil, vegetable oil, vitamin E oil  Silicone-Based Ingredients: Silicone polymers; staining; typically nonirritating, long lasting; waterproof; should not be used with silicone dilators, sexual toys, or gynecologic products Astroglide X, Oceanus Ultra Pure, Pink Silicone, Pjur Eros, Replens Silky Smooth, Silicone Premium JO, SKYN, Uberlube, [employer] Based Minimize harm to sperm motility; designed Astroglide TTC, Conceive Plus, Pre for couples trying to conceive Seed, Yes Baby  Fertility Friendly Minimize harm to sperm motility; designed Astroglide, TTC, Conceive Plus, Pre for couples  trying to conceive Seed, Yes Baby  Vaginal Moisturizers   Vaginal Moisturizers For maintenance use 1 to 3 times weekly; can benefit women with dryness, chafing with AOL, and recurrent vaginal infections irrespective of sexual activity timing Balance Active Menopause Vaginal Moisturizing Lubricant, Canesintima Intimate Moisturizer, Replens, Rephresh, Sylk Natural Intimate Moisturizer, Yes Vaginal Moisturizer  Hybrids Properties of both water and silicone-based products (combination of a vaginal lubricant and moisturizer); Non-irritating; good option for women with allergies and sensitivities Lubrigyn, Luvena  Suppositories Hyaluronic acid to retain moisture Revaree  Vulvar Soothing Creams/Oils    Medicated CreamsP ain and burn relief; Ingredients: 4% Lidocaine, Aloe Vera gel Releveum ([place])  Non-Medicated Creams For anti-itch and moisture/maintenance; Ingredients: Coconut oil, Avocado oil, Shea Butter, Olive oil, Vitamin E Vajuvenate, Vmagic  Oils !For moisture/maintenance !Coconut oil, Vitamin E oil, Emu oil        Electronically signed by:  Donnita Falls, MSN, FNP-C, CUNP 09/21/2023 11:27 AM

## 2023-09-13 ENCOUNTER — Other Ambulatory Visit: Payer: Self-pay | Admitting: Family

## 2023-09-14 ENCOUNTER — Ambulatory Visit: Payer: Medicare HMO

## 2023-09-16 ENCOUNTER — Other Ambulatory Visit: Payer: Self-pay | Admitting: Family

## 2023-09-16 DIAGNOSIS — K219 Gastro-esophageal reflux disease without esophagitis: Secondary | ICD-10-CM

## 2023-09-17 ENCOUNTER — Ambulatory Visit (INDEPENDENT_AMBULATORY_CARE_PROVIDER_SITE_OTHER): Payer: Medicare HMO | Admitting: *Deleted

## 2023-09-17 DIAGNOSIS — E538 Deficiency of other specified B group vitamins: Secondary | ICD-10-CM

## 2023-09-17 MED ORDER — CYANOCOBALAMIN 1000 MCG/ML IJ SOLN
1000.0000 ug | Freq: Once | INTRAMUSCULAR | Status: AC
Start: 2023-09-17 — End: 2023-09-17
  Administered 2023-09-17: 1000 ug via INTRAMUSCULAR

## 2023-09-17 NOTE — Progress Notes (Signed)
Pt given B12 injection IM left deltoid and tolerated well. °

## 2023-09-21 ENCOUNTER — Telehealth: Payer: Self-pay

## 2023-09-21 ENCOUNTER — Encounter: Payer: Self-pay | Admitting: Urology

## 2023-09-21 ENCOUNTER — Ambulatory Visit (INDEPENDENT_AMBULATORY_CARE_PROVIDER_SITE_OTHER): Payer: Medicare HMO | Admitting: Urology

## 2023-09-21 ENCOUNTER — Other Ambulatory Visit: Payer: Self-pay | Admitting: Urology

## 2023-09-21 VITALS — BP 135/83 | HR 79 | Temp 97.6°F

## 2023-09-21 DIAGNOSIS — N390 Urinary tract infection, site not specified: Secondary | ICD-10-CM

## 2023-09-21 DIAGNOSIS — N898 Other specified noninflammatory disorders of vagina: Secondary | ICD-10-CM

## 2023-09-21 DIAGNOSIS — N816 Rectocele: Secondary | ICD-10-CM | POA: Insufficient documentation

## 2023-09-21 DIAGNOSIS — N760 Acute vaginitis: Secondary | ICD-10-CM | POA: Insufficient documentation

## 2023-09-21 DIAGNOSIS — R399 Unspecified symptoms and signs involving the genitourinary system: Secondary | ICD-10-CM

## 2023-09-21 DIAGNOSIS — Z87440 Personal history of urinary (tract) infections: Secondary | ICD-10-CM | POA: Diagnosis not present

## 2023-09-21 DIAGNOSIS — N83202 Unspecified ovarian cyst, left side: Secondary | ICD-10-CM

## 2023-09-21 LAB — URINALYSIS, ROUTINE W REFLEX MICROSCOPIC
Bilirubin, UA: NEGATIVE
Glucose, UA: NEGATIVE
Ketones, UA: NEGATIVE
Nitrite, UA: NEGATIVE
Protein,UA: NEGATIVE
RBC, UA: NEGATIVE
Specific Gravity, UA: 1.02 (ref 1.005–1.030)
Urobilinogen, Ur: 1 mg/dL (ref 0.2–1.0)
pH, UA: 5.5 (ref 5.0–7.5)

## 2023-09-21 LAB — MICROSCOPIC EXAMINATION: Bacteria, UA: NONE SEEN

## 2023-09-21 LAB — BLADDER SCAN AMB NON-IMAGING: Scan Result: 0

## 2023-09-21 MED ORDER — ESTRADIOL 0.1 MG/GM VA CREA
1.0000 | TOPICAL_CREAM | Freq: Every day | VAGINAL | 12 refills | Status: AC
Start: 2023-09-21 — End: ?

## 2023-09-21 MED ORDER — METHENAMINE HIPPURATE 1 G PO TABS: 1.0000 g | ORAL_TABLET | Freq: Two times a day (BID) | ORAL | 11 refills | Status: DC

## 2023-09-21 MED ORDER — FOSFOMYCIN TROMETHAMINE 3 G PO PACK
3.0000 g | PACK | ORAL | 5 refills | Status: DC
Start: 2023-09-21 — End: 2023-09-21

## 2023-09-21 NOTE — Telephone Encounter (Signed)
-----   Message from Donnita Falls sent at 09/21/2023 11:36 AM EST ----- Please let pt know: After appointment today provider was notified by pharmacy that patient's insurance does not cover Fosfomcyin 3 grams once per week, therefore Hiprex 1 gram 2x/day was prescribed as alternative for UTI prevention.

## 2023-09-21 NOTE — Patient Instructions (Signed)
 Recommendations regarding UTI prevention / management:  Options when UTI symptoms occur: 1. Call [facility] [place] to request urgent / same-day visit (phone # [phone]).  2. Call your Primary Care Provider (PCP) office to request urgent / same-day visit. Be sure to request for urine culture to be ordered and have results faxed to Urology (fax # [phone]).  3. Go to urgent care. Be sure to request for urine culture to be ordered and have results faxed to Urology (fax # [phone]).   For bladder pain/ burning with urination: - Can take OTC Pyridium (phenazopyridine; commonly known under the "AZO" brand) for a few days as needed. Limit use to no more than 3 days consecutively due to risk for methemoglobinemia, liver function issues, and bone health damage with long term use of Pyridium.  Routine use for UTI prevention: - Topical vaginal estrogen for vaginal atrophy. - Adequate daily fluid intake to flush out the urinary tract. - Go to the bathroom to urinate every 4-6 hours while awake to minimize urinary stasis / bacterial overgrowth in the bladder. - Proanthocyanidin (PAC) supplement 36 mg daily; must be soluble (insoluble form of PAC will be ineffective). Recommended brand: Ellura. This is an over-the-counter supplement (often must be found/ purchased online) supplement derived from cranberries with concentrated active component: Proanthocyanidin (PAC) 36 mg daily. Decreases bacterial adherence to bladder lining.  - D-mannose powder (2 grams daily). This is an over-the-counter supplement which decreases bacterial adherence to bladder lining (it is a sugar that inhibits bacterial adherence to urothelial cells by binding to the pili of enteric bacteria). Take as per manufacturer recommendation. Can be used as an alternative or in addition to the concentrated cranberry supplement.  - Vitamin C supplement to acidify urine to minimize bacterial growth.  - Probiotic to maintain  healthy vaginal microbiome to suppress bacteria at urethral opening. Brand recommendations: Darrold Junker (includes probiotic & D-mannose ), Feminine Balance (highest concentration of lactobacillus) or Hyperbiotic Pro 15.  Note for patients with diabetes:  - Be aware that D-mannose contains sugar.  Note for patients with interstitial cystitis (IC):  - Patients with IC should typically avoid cranberry/ PAC supplements and Vitamin C supplements due to their acidity, which may exacerbate IC-related bladder pain. - Symptoms of true bacterial UTI can overlap / mimic symptoms of an IC flare up. Antibiotic use is NOT indicated for IC flare ups. Urine culture needed prior to antibiotic treatment for IC patients. The goal is to minimize your risk for developing antibiotic-resistant bacteria.    Vulvovaginal atrophy I Genitourinary syndrome of menopause (GSM):  What it is: Changes in the vaginal environment (including the vulva and urethra) including: Thinning of the epithelium (skin/ mucosa surface) Can contribute to urinary urgency and frequency Can contribute to dryness, itching, irritation of the vulvar and vaginal tissue Can contribute to pain with intercourse Can contribute to physical changes of the labia, vulva, and vagina such as: Narrowing of the vaginal opening Decreased vaginal length Loss of labial architecture Labial adhesions Pale color of vulvovaginal tissue Loss of pubic hair Allows bacteria to become adherent  Results in increased risk for urinary tract infection (UTI) due to bacterial overgrowth and migration up the urethra into the bladder Change in vaginal pH (acid/ base balance) Allows for alteration / disruption of the normal bacterial flora / microbiome Results in increased risk for urinary tract infection (UTI) due to bacterial overgrowth  Treatment options: Over-the-counter lubricants (see list below). Prescription vaginal estrogen replacement. Options: Topical vaginal  estrogen  cream Estrace, Premarin, or compounded estradiol cream/ gel We advise: Discard plastic applicator as that tends to use more medication than you need, which is not harmful but wastes / uses up the medication. Also the plastic applicator may cause discomfort. Insert blueberry size amount of medication via the tip of your finger inside vagina nightly for 1 week then 2-3 times per week (long term). Estring vaginal ring Exchanged every 3 months (either at home or in office by provider) Vagifem vaginal tablet Inserted nightly for 2 weeks then twice a week (long term) lntrarosa vaginal suppository Vaginal DHEA: converts to estrogen in vaginal tissue without systemic effect Inserted nightly (long term) Vaginal laser therapy (Mona Lisa touch) Performed in 3 treatments each 6 weeks apart (available in our [place] office). Can feel like a sunburn for 3-4 days after each treatment until new skin heals in. Usually not covered by insurance. Estimated cost is $1500 for all 3 sessions.  FYI regarding prescription vaginal estrogen treatment options: All topical vaginal estrogen replacement options are equivalent in terms of efficacy. Topical vaginal estrogen replacement will take about 3 months to be effective. OK to have sex with any of the topical vaginal estrogen replacement options. Topical vaginal estrogen replacement may sting/burn initially due to severe dryness, which will improve with ongoing treatment. There have been studies that evaluate use of low-dose intravaginal estrogen that show minimal systemic absorption which is negligible after 3 weeks. There have been no studies indicating increased risk of contributing to cancer development or recurrence.  Topical vaginal estrogen cream safe to use with breast cancer history [email or web address]  Topical vaginal estrogen cream safe to use with blood clot  history [email or web address]   Lubricants and Moisturizers for Treating Genitourinary Syndrome of Menopause and Vulvovaginal Atrophy Treatment Comments I Available Products   Lubricants   Water-based Ingredients: Deionized water, glycerin, propylene glycol; latex safe; rare irritation; dry out with extended sexual activity Astroglide, Good Clean Love, K-Y Jelly, Natural, Organic, Pink, Sliquid, Sylk, Yes    Oil Based Ingredients: avocado, olive, peanut, corn; latex safe; can be used with silicone products; staining; safe (unless peanut allergy); non-irritating Coconut oil, vegetable oil, vitamin E oil  Silicone-Based Ingredients: Silicone polymers; staining; typically nonirritating, long lasting; waterproof; should not be used with silicone dilators, sexual toys, or gynecologic products Astroglide X, Oceanus Ultra Pure, Pink Silicone, Pjur Eros, Replens Silky Smooth, Silicone Premium JO, SKYN, Uberlube, [employer] Based Minimize harm to sperm motility; designed Astroglide TTC, Conceive Plus, Pre for couples trying to conceive Seed, Yes Baby  Fertility Friendly Minimize harm to sperm motility; designed Astroglide, TTC, Conceive Plus, Pre for couples trying to conceive Seed, Yes Baby  Vaginal Moisturizers   Vaginal Moisturizers For maintenance use 1 to 3 times weekly; can benefit women with dryness, chafing with AOL, and recurrent vaginal infections irrespective of sexual activity timing Balance Active Menopause Vaginal Moisturizing Lubricant, Canesintima Intimate Moisturizer, Replens, Rephresh, Sylk Natural Intimate Moisturizer, Yes Vaginal Moisturizer  Hybrids Properties of both water and silicone-based products (combination of a vaginal lubricant and moisturizer); Non-irritating; good option for women with allergies and  sensitivities Lubrigyn, Luvena  Suppositories Hyaluronic acid to retain moisture Revaree  Vulvar Soothing Creams/Oils    Medicated CreamsP ain and burn relief; Ingredients: 4% Lidocaine, Aloe Vera gel Releveum ([place])  Non-Medicated Creams For anti-itch and moisture/maintenance; Ingredients: Coconut oil, Avocado oil, Shea Butter, Olive oil, Vitamin E Vajuvenate, Vmagic  Oils !For moisture/maintenance !Coconut oil, Vitamin E oil, Emu oil

## 2023-09-21 NOTE — Telephone Encounter (Signed)
Patient is made aware and voiced understanding. 

## 2023-09-24 ENCOUNTER — Telehealth: Payer: Self-pay | Admitting: Family

## 2023-09-24 ENCOUNTER — Encounter: Payer: Self-pay | Admitting: Family

## 2023-09-24 NOTE — Telephone Encounter (Signed)
 Ok for note

## 2023-09-24 NOTE — Telephone Encounter (Signed)
Okay for letter to be written?

## 2023-09-24 NOTE — Telephone Encounter (Signed)
 Patient aware letter written and put up front for patient to pick up.

## 2023-09-25 ENCOUNTER — Ambulatory Visit (INDEPENDENT_AMBULATORY_CARE_PROVIDER_SITE_OTHER): Payer: Medicare HMO

## 2023-09-25 DIAGNOSIS — I442 Atrioventricular block, complete: Secondary | ICD-10-CM

## 2023-09-25 LAB — CUP PACEART REMOTE DEVICE CHECK
Battery Remaining Longevity: 34 mo
Battery Voltage: 2.86 V
Brady Statistic AP VP Percent: 0.12 %
Brady Statistic AP VS Percent: 99.83 %
Brady Statistic AS VP Percent: 0 %
Brady Statistic AS VS Percent: 0.05 %
Brady Statistic RA Percent Paced: 100 %
Brady Statistic RV Percent Paced: 0.12 %
Date Time Interrogation Session: 20250103011730
Implantable Lead Connection Status: 753985
Implantable Lead Connection Status: 753985
Implantable Lead Implant Date: 20181206
Implantable Lead Implant Date: 20181206
Implantable Lead Location: 753860
Implantable Lead Location: 753860
Implantable Lead Model: 3830
Implantable Lead Model: 5076
Implantable Pulse Generator Implant Date: 20181206
Lead Channel Impedance Value: 266 Ohm
Lead Channel Impedance Value: 342 Ohm
Lead Channel Impedance Value: 380 Ohm
Lead Channel Impedance Value: 570 Ohm
Lead Channel Sensing Intrinsic Amplitude: 11.875 mV
Lead Channel Sensing Intrinsic Amplitude: 3.625 mV
Lead Channel Sensing Intrinsic Amplitude: 4.75 mV
Lead Channel Sensing Intrinsic Amplitude: 5.25 mV
Lead Channel Setting Pacing Amplitude: 2 V
Lead Channel Setting Pacing Amplitude: 2.5 V
Lead Channel Setting Pacing Pulse Width: 0.3 ms
Lead Channel Setting Sensing Sensitivity: 2 mV
Zone Setting Status: 755011
Zone Setting Status: 755011

## 2023-09-25 LAB — URINE CULTURE

## 2023-09-28 ENCOUNTER — Other Ambulatory Visit: Payer: Medicare HMO | Admitting: Urology

## 2023-09-28 ENCOUNTER — Telehealth: Payer: Self-pay

## 2023-09-28 NOTE — Telephone Encounter (Signed)
 Patient call was returned. Patient was made aware and voiced understanding.

## 2023-09-28 NOTE — Telephone Encounter (Signed)
 Tried calling patient with no answer, left vm for return call to office.

## 2023-09-28 NOTE — Telephone Encounter (Signed)
-----   Message from Lauraine JAYSON Oz sent at 09/28/2023  8:58 AM EST ----- Please let pt know urine culture result. Advised to proceed with Hiprex  use BID which was prescribed as an alternative for Fosfomycin since her insurance denied coverage for that. Thanks.

## 2023-10-01 ENCOUNTER — Ambulatory Visit: Payer: Medicare HMO | Admitting: Obstetrics and Gynecology

## 2023-10-13 ENCOUNTER — Encounter: Payer: Self-pay | Admitting: Family

## 2023-10-13 ENCOUNTER — Other Ambulatory Visit: Payer: Self-pay | Admitting: Family

## 2023-10-13 DIAGNOSIS — K219 Gastro-esophageal reflux disease without esophagitis: Secondary | ICD-10-CM

## 2023-10-13 NOTE — Telephone Encounter (Signed)
christy pt NTBS 30-d given 09/17/23

## 2023-10-13 NOTE — Telephone Encounter (Signed)
Lmtcb. Letter mailed 

## 2023-10-17 ENCOUNTER — Other Ambulatory Visit: Payer: Self-pay | Admitting: Family

## 2023-10-17 DIAGNOSIS — K219 Gastro-esophageal reflux disease without esophagitis: Secondary | ICD-10-CM

## 2023-10-19 ENCOUNTER — Ambulatory Visit (INDEPENDENT_AMBULATORY_CARE_PROVIDER_SITE_OTHER): Payer: Medicare HMO

## 2023-10-19 ENCOUNTER — Telehealth: Payer: Self-pay | Admitting: Family

## 2023-10-19 DIAGNOSIS — E538 Deficiency of other specified B group vitamins: Secondary | ICD-10-CM | POA: Diagnosis not present

## 2023-10-19 MED ORDER — CYANOCOBALAMIN 1000 MCG/ML IJ SOLN
1000.0000 ug | Freq: Once | INTRAMUSCULAR | Status: AC
  Administered 2023-10-19: 1000 ug via INTRAMUSCULAR

## 2023-10-19 NOTE — Progress Notes (Signed)
Patient is in office today for a nurse visit for B12 Injection. Patient Injection was given in the  Right deltoid. Patient tolerated injection well.

## 2023-10-23 ENCOUNTER — Encounter: Payer: Self-pay | Admitting: Obstetrics and Gynecology

## 2023-10-26 ENCOUNTER — Ambulatory Visit (INDEPENDENT_AMBULATORY_CARE_PROVIDER_SITE_OTHER): Payer: Medicare HMO | Admitting: Obstetrics and Gynecology

## 2023-10-26 ENCOUNTER — Encounter: Payer: Self-pay | Admitting: Obstetrics and Gynecology

## 2023-10-26 VITALS — BP 128/82 | HR 72 | Ht 65.75 in | Wt 141.0 lb

## 2023-10-26 DIAGNOSIS — N8110 Cystocele, unspecified: Secondary | ICD-10-CM

## 2023-10-26 DIAGNOSIS — N83202 Unspecified ovarian cyst, left side: Secondary | ICD-10-CM

## 2023-10-26 DIAGNOSIS — M810 Age-related osteoporosis without current pathological fracture: Secondary | ICD-10-CM

## 2023-10-26 DIAGNOSIS — N390 Urinary tract infection, site not specified: Secondary | ICD-10-CM | POA: Diagnosis not present

## 2023-10-26 NOTE — Progress Notes (Signed)
Acute Office Visit  Subjective:    Patient ID: Sabrina [name], female    DOB: 1946/07/05, 78 y.o.   MRN: [id number]   HPI 78 y.o. presents today for Establish Care (P-2017-WNL, M-2023-WNL, D-12/25/2021-osteoporosis, C-2017-Due 2022) and GYN Problem (Per Referral-Patient reporting gas, stool, and pus passing through vagina since 2017; has not been evaluated by GYN. Negative GI work up so far including colonoscopy and CT. Large rectocele. Recurrent UTIs.) .h/o vaginal hysterectomy  She reports she has an ovarian cyst as well that is increased in size and would like to be seen for this as well.  No LMP recorded. Patient is postmenopausal.    Study Result  Narrative & Impression  CLINICAL DATA:  Passing stool from vagina. Recurrent urinary tract infections. Evaluate for recurrent colovaginal or rectovaginal fistula.   EXAM: CT PELVIS WITH CONTRAST   TECHNIQUE: Multidetector CT imaging of the pelvis was performed using the standard protocol following the bolus administration of intravenous contrast. Rectal contrast was administered.   RADIATION DOSE REDUCTION: This exam was performed according to the departmental dose-optimization program which includes automated exposure control, adjustment of the mA and/or kV according to patient size and/or use of iterative reconstruction technique.   CONTRAST:  OMNIPAQUE IOHEXOL 300 MG/ML  SOLN   COMPARISON:  Pelvic CT 07/23/2018.  Abdominopelvic CT 09/29/2016.   FINDINGS: Urinary Tract: The visualized distal ureters and bladder appear unremarkable. Specifically, no gas or contrast within the bladder lumen. No bladder wall thickening.   Bowel: Rectal contrast has passed retrograde into the transverse colon. Diffuse diverticulosis of the descending and sigmoid colon without wall thickening or surrounding inflammation. No definite fistula from the colon or rectum identified. There are no pericolonic fluid collections.    Vascular/Lymphatic: No enlarged pelvic lymph nodes identified. Aortoiliac atherosclerosis without evidence of aneurysm or large vessel occlusion.   Reproductive: Status post hysterectomy. As before, there is air within the vagina. Difficult to exclude a small amount of contrast within the vaginal lumen, although this may relate to enhancement from the administered intravenous contrast. Cystic appearing left ovarian lesion measures 2.4 x 1.7 cm on image 69/2, enlarged from previous CT at which time this measured 1.0 cm maximally. No suspicious adnexal findings.   Other: No ascites, focal extraluminal fluid collection, pneumoperitoneum or extravasated contrast.   Musculoskeletal: No acute or worrisome osseous findings. Lower lumbar spondylosis. Previous left femoral ORIF.   IMPRESSION: 1. No definite recurrent colovaginal or rectovaginal fistula identified. There is persistent air within the vagina, and a small amount of contrast within the vaginal lumen is difficult to exclude. The vaginal cuff is in close proximity to extensive diverticular changes within the sigmoid colon. 2. Diffuse diverticulosis of the descending and sigmoid colon without evidence of acute diverticulitis. 3. 2.4 cm simple appearing left ovarian cyst, enlarged from previous CT. Although likely benign, given postmenopausal status and enlargement from previous study, recommend follow-up pelvic ultrasound in 6 months. 4.  Aortic Atherosclerosis (ICD10-I70.0).   2019 CT MPRESSION: 1. One small dot of air in the vagina which could be normal. No definite extravasation of rectal contrast material into the vagina to suggest a rectovaginal or colovaginal fistula. 2. Severe sigmoid colon diverticulosis.  2017 CT IMPRESSION: 1. Small pockets of gas within the vagina with trace amount of contrast present in the vaginal vault. Small linear gas collection extending from the left anterior aspect of the rectum towards  the posterior wall of the vagina ; collective findings are suspicious for rectovaginal fistula.  2. Numerous colon diverticula without CT evidence for acute diverticulitis. 3. Atherosclerotic vascular calcifications of the aorta and iliac vessels. Review of Systems     Objective:    Physical Exam Genitourinary:     Genitourinary Comments: Palpable left ovarian cyst Non tender to palpation  No stool seen in vaginal vault or obvious evidence of fistula     Vaginal cuff intact.    Anterior and posterior vaginal prolapse present.    BP 128/82   Pulse 72   Ht 5' 5.75" (1.67 m)   Wt 141 lb (64 kg)   SpO2 99%   BMI 22.93 kg/m  Wt Readings from Last 3 Encounters:  10/26/23 141 lb (64 kg)  08/14/23 138 lb 3.2 oz (62.7 kg)  08/06/23 138 lb 12.8 oz (63 kg)   Past Surgical History:  Procedure Laterality Date   APPENDECTOMY     AV NODE ABLATION N/A 08/27/2017   Procedure: AV NODE ABLATION;  Surgeon: [name], MD;  Location: [facility];  Service: Cardiovascular;  Laterality: N/A;   CATARACT EXTRACTION W/PHACO Right 03/16/2023   Procedure: CATARACT EXTRACTION PHACO AND INTRAOCULAR LENS PLACEMENT (IOC);  Surgeon: [name], MD;  Location: [facility];  Service: Ophthalmology;  Laterality: Right;  CDE: 10.62   CATARACT EXTRACTION W/PHACO Left 05/08/2023   Procedure: CATARACT EXTRACTION PHACO AND INTRAOCULAR LENS PLACEMENT (IOC);  Surgeon: [name], MD;  Location: [facility];  Service: Ophthalmology;  Laterality: Left;  CDE 11.28   CHOLECYSTECTOMY OPEN  1978   DILATION AND CURETTAGE OF UTERUS     FEMUR FRACTURE SURGERY Left 2013   "put 7" rod in it"   FRACTURE SURGERY     IVC FILTER REMOVAL N/A 06/20/2021   Procedure: IVC FILTER REMOVAL;  Surgeon: Cephus Shelling, MD;  Location: [facility];  Service: Cardiovascular;  Laterality: N/A;   LAPAROSCOPY  08/22/2016   Procedure: LAPAROSCOPY DIAGNOSTIC;  Surgeon: Romie Levee, MD;  Location: [facility];  Service:  General;;   LEFT ATRIAL APPENDAGE OCCLUSION N/A 08/01/2021   Procedure: LEFT ATRIAL APPENDAGE OCCLUSION;  Surgeon: Lanier Prude, MD;  Location: [facility];  Service: Cardiovascular;  Laterality: N/A;   LEFT HEART CATH AND CORONARY ANGIOGRAPHY N/A 02/06/2022   Procedure: LEFT HEART CATH AND CORONARY ANGIOGRAPHY;  Surgeon: Lennette Bihari, MD;  Location: [facility];  Service: Cardiovascular;  Laterality: N/A;   MEDIAL PARTIAL KNEE REPLACEMENT Right 2005   "@ [facility]"   PACEMAKER IMPLANT N/A 08/27/2017   Procedure: PACEMAKER IMPLANT;  Surgeon: [name], MD;  Location: [facility];  Service: Cardiovascular;  Laterality: N/A;   ROUX-EN-Y GASTRIC BYPASS  2002   [facility]   SPLENECTOMY  2002   TEE WITHOUT CARDIOVERSION N/A 08/01/2021   Procedure: TRANSESOPHAGEAL ECHOCARDIOGRAM (TEE);  Surgeon: Lanier Prude, MD;  Location[facility]ASIVE CV LAB;  Service: Cardiovascular;  Laterality: N/A;   TONSILLECTOMY  1944   TUBAL LIGATION     VAGINAL HYSTERECTOMY      Past Medical History:  Diagnosis Date   Anemia    Ankle fracture, right    past hx. -"no surgery"   Anxiety    Asthma    CHF (congestive heart failure) (HCC) 2009   Chronic lower back pain    Collagen vascular disease (HCC)    COPD (chronic obstructive pulmonary disease) (HCC)    Depression    Fibromyalgia    GERD (gastroesophageal reflux disease)  Hyperlipidemia    Hypertension    Immature cataract of both eyes    Myocardial infarction (HCC)    "I've had a light one; don't know when it happened" (08/27/2017)   Osteoarthritis    Peripheral neuropathy    legs and feet   Persistent atrial fibrillation (HCC)    Presence of Watchman left atrial appendage closure device 08/01/2021   24 mm Watchman FLXDevice LOT # [id number] by Dr. Lalla Brothers   Tremors of nervous system    noted in hands by pt last 6 months   Tubular adenoma of colon         Patient informed chaperone available to  be present for breast and/or pelvic exam. Patient has requested no chaperone to be present. Patient has been advised what will be completed during breast and pelvic exam.   Assessment & Plan:  Simple 2.4cm left ovarian cyst: to get CA125 today.  Patient is without pain. To get TV US to evaluate further as well. Referral to urogyn for evaluate of cystocele and rule out a fistula.  No obvious signs of rectovaginal fistula on exam today.   Earley Favor

## 2023-10-27 ENCOUNTER — Other Ambulatory Visit: Payer: Self-pay | Admitting: Family

## 2023-10-27 DIAGNOSIS — F411 Generalized anxiety disorder: Secondary | ICD-10-CM

## 2023-10-27 DIAGNOSIS — K219 Gastro-esophageal reflux disease without esophagitis: Secondary | ICD-10-CM

## 2023-10-31 LAB — OVARIAN MALIGNANCY RISK-ROMA
CA125: 10 U/mL (ref <35)
HE4, OVARIAN CANCER MONITORING: 90 pmol/L
ROMA Postmenopausal: 1.52 (ref <2.77)
ROMA Premenopausal: 2.39 — ABNORMAL HIGH (ref <1.31)

## 2023-11-03 NOTE — Progress Notes (Signed)
Remote pacemaker transmission.

## 2023-11-05 ENCOUNTER — Ambulatory Visit: Payer: Medicare HMO | Attending: Cardiology | Admitting: Cardiology

## 2023-11-05 ENCOUNTER — Encounter: Payer: Self-pay | Admitting: Cardiology

## 2023-11-05 VITALS — BP 128/80 | HR 88 | Ht 64.0 in | Wt 141.0 lb

## 2023-11-05 DIAGNOSIS — Z79899 Other long term (current) drug therapy: Secondary | ICD-10-CM | POA: Diagnosis not present

## 2023-11-05 DIAGNOSIS — I2510 Atherosclerotic heart disease of native coronary artery without angina pectoris: Secondary | ICD-10-CM

## 2023-11-05 DIAGNOSIS — R600 Localized edema: Secondary | ICD-10-CM

## 2023-11-05 DIAGNOSIS — I4891 Unspecified atrial fibrillation: Secondary | ICD-10-CM

## 2023-11-05 DIAGNOSIS — E782 Mixed hyperlipidemia: Secondary | ICD-10-CM

## 2023-11-05 NOTE — Progress Notes (Signed)
Clinical Summary Ms. Angevine is a 78 y.o.female seen today for follow up of the following medical problems.    1.Persistent afib/pacemaker - s/p av nodal ablation and pacemaker - Gi bleeding issues on anticoag, now with watchman device.    - some palpitations, often late in the afternoon. Lasts just a seconds.  - compliant with meds  - Jan 2025 normal device check. 4 episodes NSVT 8-14 beats.    2. LE edema/chronic diastolic HF 05/2016 60-65%, no WMAs, mild MR, normal RV  -edema controlled. Pcp recently cut back lasix due to low bp's and AKI that have resolved   - swelling has improved.       3.Chest pain/CAD - 01/2022 cath nonobstructive disease - no recent symptoms.         4. Hyperlipidemia 03/2023 TC 197 TG 120 HDL 62 LDL 114 - based on this panel atorva was increased to 40mg  daily, needs repeat panel       Doing silver sneakers at the gym Past Medical History:  Diagnosis Date   Anemia    Ankle fracture, right    past hx. -"no surgery"   Anxiety    Asthma    CHF (congestive heart failure) (HCC) 2009   Chronic lower back pain    Collagen vascular disease (HCC)    COPD (chronic obstructive pulmonary disease) (HCC)    Depression    Fibromyalgia    GERD (gastroesophageal reflux disease)    Hyperlipidemia    Hypertension    Immature cataract of both eyes    Myocardial infarction (HCC)    "I've had a light one; don't know when it happened" (08/27/2017)   Osteoarthritis    Peripheral neuropathy    legs and feet   Persistent atrial fibrillation (HCC)    Presence of Watchman left atrial appendage closure device 08/01/2021   24 mm Watchman FLXDevice LOT # [id number] by Dr. Lalla Brothers   Tremors of nervous system    noted in hands by pt last 6 months   Tubular adenoma of colon      Allergies  Allergen Reactions   Codeine Nausea And Vomiting    Stomach pain also     Current Outpatient Medications  Medication Sig Dispense Refill   albuterol (VENTOLIN  HFA) 108 (90 Base) MCG/ACT inhaler INHALE 2 PUFFS BY MOUTH EVERY 6 HOURS AS NEEDED FOR WHEEZE OR SHORTNESS OF BREATH 1 each 1   aspirin EC 81 MG EC tablet Take 1 tablet (81 mg total) by mouth daily. Swallow whole. 30 tablet 11   atorvastatin (LIPITOR) 40 MG tablet Take 1 tablet (40 mg total) by mouth daily. 90 tablet 3   blood glucose meter kit and supplies Dispense based on patient and insurance preference. Use up to four times daily as directed. (FOR ICD-10 E10.9, E11.9). 1 each 0   Blood Glucose Monitoring Suppl (TRUE METRIX AIR GLUCOSE METER) w/Device KIT Test BS daily Dx R73.09 1 kit 0   cromolyn (NASALCROM) 5.2 MG/ACT nasal spray Place 1 spray into both nostrils 2 (two) times daily as needed for allergies.     DULoxetine (CYMBALTA) 60 MG capsule TAKE 1 CAPSULE BY MOUTH EVERY DAY 90 capsule 0   estradiol (ESTRACE) 0.1 MG/GM vaginal cream Place 1 Applicatorful vaginally at bedtime. 42.5 g 12   ferrous sulfate 325 (65 FE) MG tablet Take 325 mg by mouth once a week.     fluticasone (FLONASE) 50 MCG/ACT nasal spray SPRAY 2 SPRAYS INTO [facility]  NOSTRIL EVERY DAY 48 mL 3   furosemide (LASIX) 20 MG tablet TAKE 1 TABLET BY MOUTH EVERY DAY 30 tablet 0   gabapentin (NEURONTIN) 100 MG capsule 100 mg in AM and 300 mg in evening 360 capsule 1   glucose blood (TRUE METRIX BLOOD GLUCOSE TEST) test strip Test BS daily Dx R73.09 100 each 3   methenamine (HIPREX) 1 g tablet Take 1 tablet (1 g total) by mouth 2 (two) times daily with a meal. Most effective when taken with a daily Vitamin C supplement. 60 tablet 11   Multiple Vitamin (MULTIVITAMIN WITH MINERALS) TABS tablet Take 1 tablet by mouth in the morning.     mupirocin cream (BACTROBAN) 2 % Apply 1 Application topically 2 (two) times daily. 15 g 0   nitroGLYCERIN (NITROSTAT) 0.4 MG SL tablet Place 1 tablet (0.4 mg total) under the tongue every 5 (five) minutes x 3 doses as needed for chest pain. (Patient not taking: Reported on 10/26/2023) 25 tablet 12   Omega-3  Fatty Acids (FISH OIL PO) Take 1 capsule by mouth daily.     pantoprazole (PROTONIX) 40 MG tablet TAKE 1 TABLET BY MOUTH TWICE A DAY 60 tablet 0   potassium chloride SA (KLOR-CON M) 20 MEQ tablet Take 1 tablet (20 mEq total) by mouth 2 (two) times daily. Take 1 tablet twice a day 180 tablet 1   SYMBICORT 80-4.5 MCG/ACT inhaler INHALE TWO PUFFS INTO THE LUNGS IN THE MORNING AND 2 PUFFS AT BEDTIME 30.6 each 0   triamcinolone ointment (KENALOG) 0.5 % Apply 1 application topically 2 (two) times daily. 30 g 0   TRUEplus Lancets 33G MISC Test BS daily Dx R73.09 100 each 3   vitamin C (ASCORBIC ACID) 500 MG tablet Take 500 mg by mouth daily.     Vitamin D, Ergocalciferol, (DRISDOL) 1.25 MG (50000 UNIT) CAPS capsule TAKE ONE CAPSULE (50,000 UNITS DOSE) BY MOUTH ONCE A WEEK. WEDNESDAY 12 capsule 1   No current facility-administered medications for this visit.     Past Surgical History:  Procedure Laterality Date   APPENDECTOMY     AV NODE ABLATION N/A 08/27/2017   Procedure: AV NODE ABLATION;  Surgeon: [name], MD;  Location: [facility];  Service: Cardiovascular;  Laterality: N/A;   CATARACT EXTRACTION W/PHACO Right 03/16/2023   Procedure: CATARACT EXTRACTION PHACO AND INTRAOCULAR LENS PLACEMENT (IOC);  Surgeon: [name], MD;  Location: [facility];  Service: Ophthalmology;  Laterality: Right;  CDE: 10.62   CATARACT EXTRACTION W/PHACO Left 05/08/2023   Procedure: CATARACT EXTRACTION PHACO AND INTRAOCULAR LENS PLACEMENT (IOC);  Surgeon: [name], MD;  Location: [facility];  Service: Ophthalmology;  Laterality: Left;  CDE 11.28   CHOLECYSTECTOMY OPEN  1978   DILATION AND CURETTAGE OF UTERUS     FEMUR FRACTURE SURGERY Left 2013   "put 7" rod in it"   FRACTURE SURGERY     IVC FILTER REMOVAL N/A 06/20/2021   Procedure: IVC FILTER REMOVAL;  Surgeon: Cephus Shelling, MD;  Location: [facility];  Service: Cardiovascular;  Laterality: N/A;   LAPAROSCOPY  08/22/2016   Procedure:  LAPAROSCOPY DIAGNOSTIC;  Surgeon: Romie Levee, MD;  Location: [facility];  Service: General;;   LEFT ATRIAL APPENDAGE OCCLUSION N/A 08/01/2021   Procedure: LEFT ATRIAL APPENDAGE OCCLUSION;  Surgeon: Lanier Prude, MD;  Location: [facility];  Service: Cardiovascular;  Laterality: N/A;   LEFT HEART CATH AND CORONARY ANGIOGRAPHY N/A 02/06/2022   Procedure: LEFT  HEART CATH AND CORONARY ANGIOGRAPHY;  Surgeon: Lennette Bihari, MD;  Location: [facility];  Service: Cardiovascular;  Laterality: N/A;   MEDIAL PARTIAL KNEE REPLACEMENT Right 2005   "@ [facility]"   PACEMAKER IMPLANT N/A 08/27/2017   Procedure: PACEMAKER IMPLANT;  Surgeon: [name], MD;  Location: [facility];  Service: Cardiovascular;  Laterality: N/A;   ROUX-EN-Y GASTRIC BYPASS  2002   [facility]   SPLENECTOMY  2002   TEE WITHOUT CARDIOVERSION N/A 08/01/2021   Procedure: TRANSESOPHAGEAL ECHOCARDIOGRAM (TEE);  Surgeon: Lanier Prude, MD;  Location: [facility] INVASIVE CV LAB;  Service: Cardiovascular;  Laterality: N/A;   TONSILLECTOMY  1944   TUBAL LIGATION     VAGINAL HYSTERECTOMY       Allergies  Allergen Reactions   Codeine Nausea And Vomiting    Stomach pain also      Family History  Problem Relation Age of Onset   Cancer Mother    Alzheimer's disease Mother    Heart disease Mother    Arthritis Father    Asthma Daughter    Arthritis Daughter    Obesity Daughter    Colon cancer Paternal Uncle 47   Arthritis Son    Hyperlipidemia Son    Obesity Son    Arthritis Son    Obesity Son    BRCA 1/2 Neg Hx      Social History Ms. Ende reports that she quit smoking about 30 years ago. Her smoking use included cigarettes. She started smoking about 55 years ago. She has a 12.5 pack-year smoking history. She has never used smokeless tobacco. Ms. Dehaan reports that she does not currently use alcohol.      Physical Examination Today's Vitals   11/05/23 1343  BP: 128/80   Pulse: 88  SpO2: 97%  Weight: 141 lb (64 kg)  Height: 5\' 4"  (1.626 m)   Body mass index is 24.2 kg/m.  Gen: resting comfortably, no acute distress HEENT: no scleral icterus, pupils equal round and reactive, no palptable cervical adenopathy,  CV: RRR, no mrg, no jvd Resp: Clear to auscultation bilaterally GI: abdomen is soft, non-tender, non-distended, normal bowel sounds, no hepatosplenomegaly MSK: extremities are warm, no edema.  Skin: warm, no rash Neuro:  no focal deficits Psych: appropriate affect   Diagnostic Studies  05/2016 echo Study Conclusions   - Left ventricle: The cavity size was normal. There was mild    concentric hypertrophy. Systolic function was normal. The    estimated ejection fraction was in the range of 60% to 65%. Wall    motion was normal; there were no regional wall motion    abnormalities. There was no evidence of elevated ventricular    filling pressure by Doppler parameters.  - Aortic valve: Trileaflet; normal thickness leaflets. There was no    regurgitation.  - Mitral valve: Structurally normal valve. There was mild    regurgitation.  - Left atrium: The atrium was severely dilated.  - Right ventricle: The cavity size was normal. Wall thickness was    normal. Systolic function was normal.  - Right atrium: The atrium was normal in size.  - Tricuspid valve: There was mild regurgitation.  - Pulmonic valve: There was no regurgitation.  - Pulmonary arteries: Systolic pressure was within the normal    range.  - Inferior vena cava: The vessel was normal in size.  - Pericardium, extracardiac: There was no pericardial effusion.        05/2016  nuclear stress There was no ST segment deviation noted during stress. The study is normal. This is a low risk study. The left ventricular ejection fraction is hyperdynamic (>65%).   Normal pharmacologic stress test with no evidence of scar or ischemia.    01/2022 cath   Prox LAD lesion is 20% stenosed.    Mid LAD-1 lesion is 20% stenosed.   Mid LAD-2 lesion is 20% stenosed.   1st Mrg lesion is 20% stenosed.   Prox RCA lesion is 20% stenosed.   The left ventricular systolic function is normal.   LV end diastolic pressure is normal.   The left ventricular ejection fraction is 55-65% by visual estimate.   Three-vessel coronary calcification with mild nonobstructive CAD with a dominant RCA.   Normal LV function with EF estimated at 55 to 65%; LVEDP 12 mmHg.   RECOMMENDATION: Medical therapy for multivessel nonobstructive CAD.  Aspirin for antiplatelet benefit.  Aggressive lipid-lowering therapy with target LDL less than 70.   01/2022 echo    1. Left ventricular ejection fraction, by estimation, is 60 to 65%. The  left ventricle has normal function. The left ventricle has no regional  wall motion abnormalities. Left ventricular diastolic parameters are  indeterminate.   2. Pacing wires in RA/RV . Right ventricular systolic function is normal.  The right ventricular size is normal. There is normal pulmonary artery  systolic pressure.   3. Left atrial size was moderately dilated.   4. Right atrial size was mildly dilated.   5. The mitral valve is abnormal. Mild mitral valve regurgitation. No  evidence of mitral stenosis.   6. The aortic valve is tricuspid. There is mild calcification of the  aortic valve. Aortic valve regurgitation is mild. Aortic valve sclerosis  is present, with no evidence of aortic valve stenosis.   7. The inferior vena cava is normal in size with greater than 50%  respiratory variability, suggesting right atrial pressure of 3 mmHg.      Assessment and Plan  1.Afib/pacemaker - she is s/p av nodal ablation with pacemaker  - has watchman device, prior GI bleeding issues on anticoagulation - mild palpitations likely from her short NSVT noted on device check, overall not bothersome. Continue current meds.     2. LE edema -controlled, continue current meds   3.  CAD - mild disease by cath - no symtoms, continue current meds    4. Hyperlipidemia - above goal, increase atorvastatin to 40mg  daily.      Antoine Poche, M.D.

## 2023-11-05 NOTE — Patient Instructions (Addendum)
Medication Instructions:   Continue all current medications.   Labwork:  FLP - order given - will be doing with pcp  Reminder:  Nothing to eat or drink after 12 midnight prior to labs. Office will contact with results via phone, letter or mychart.     Testing/Procedures:  none  Follow-Up:  6 months   Any Other Special Instructions Will Be Listed Below (If Applicable).   If you need a refill on your cardiac medications before your next appointment, please call your pharmacy.

## 2023-11-05 NOTE — Addendum Note (Signed)
Addended by: Lesle Chris on: 11/05/2023 02:38 PM   Modules accepted: Orders

## 2023-11-06 ENCOUNTER — Ambulatory Visit: Payer: Medicare HMO | Admitting: Family

## 2023-11-09 ENCOUNTER — Ambulatory Visit (INDEPENDENT_AMBULATORY_CARE_PROVIDER_SITE_OTHER): Payer: Medicare HMO | Admitting: Urology

## 2023-11-09 VITALS — BP 119/79 | HR 85

## 2023-11-09 DIAGNOSIS — Z09 Encounter for follow-up examination after completed treatment for conditions other than malignant neoplasm: Secondary | ICD-10-CM | POA: Diagnosis not present

## 2023-11-09 DIAGNOSIS — Z87440 Personal history of urinary (tract) infections: Secondary | ICD-10-CM | POA: Diagnosis not present

## 2023-11-09 DIAGNOSIS — K5790 Diverticulosis of intestine, part unspecified, without perforation or abscess without bleeding: Secondary | ICD-10-CM

## 2023-11-09 DIAGNOSIS — N390 Urinary tract infection, site not specified: Secondary | ICD-10-CM | POA: Diagnosis not present

## 2023-11-09 LAB — URINALYSIS, ROUTINE W REFLEX MICROSCOPIC
Bilirubin, UA: NEGATIVE
Glucose, UA: NEGATIVE
Ketones, UA: NEGATIVE
Nitrite, UA: NEGATIVE
Protein,UA: NEGATIVE
RBC, UA: NEGATIVE
Specific Gravity, UA: 1.025 (ref 1.005–1.030)
Urobilinogen, Ur: 0.2 mg/dL (ref 0.2–1.0)
pH, UA: 6 (ref 5.0–7.5)

## 2023-11-09 LAB — MICROSCOPIC EXAMINATION

## 2023-11-09 MED ORDER — CIPROFLOXACIN HCL 500 MG PO TABS
500.0000 mg | ORAL_TABLET | Freq: Once | ORAL | Status: AC
  Administered 2023-11-09: 500 mg via ORAL

## 2023-11-09 NOTE — Progress Notes (Signed)
  [place]  11/09/23  CC: No chief complaint on file.   HPI: CV fistula ruled out with Dr. Maisie Fus 2017 - 2018. Now thinks stool again per vagina. Dec 2024 CT with air, possible contrast in vagina. Benign Gyn exam without obvious fistula. Dec 2024 UA looked reassuring - no bacteria or rbc. 6-10 wbc. Leu 1+, N neg. Cx + for enterococcus.   Patient reports recurrent urinary tract infection and is using vaginal estrogen, taking probiotics and drinking cranberry.  Now, patient complains of air and stool passing per vagina.  She does not perceive this when she voids.  She feels like her urine is clear.  When she wipes she sees what she thinks is stool on the toilet paper.  Cystoscopy and vaginoscopy today without obvious fistula.  UA today moderate bacteria, leu 1+, N neg. 0-2 rbc, 11-30 wbc. Pt without gross hematuria or dysuria.   Blood pressure 119/79, pulse 85. NED. A&Ox3.   No respiratory distress   Abd soft, NT, ND Normal external genitalia with patent urethral meatus Grade 1 cystocele.  The vulva and introitus with a right lesion.  I did not see any obvious liquid or stool per vagina.  Chaperone for exam and cystoscopy Alyesha.   Cystoscopy Procedure Note  Patient identification was confirmed, informed consent was obtained, and patient was prepped using Betadine solution.  Lidocaine jelly was administered per urethral meatus.    Procedure: - Flexible cystoscope introduced, without any difficulty.   - Thorough search of the bladder revealed:    normal urethral meatus    normal urothelium    no stones    no ulcers     no tumors    no urethral polyps    Moderate trabeculation  - Ureteral orifices were normal in position and appearance.  Post-Procedure: - Patient tolerated the procedure well  Assessment/ Plan:  Recurrent UTI-continue current strategy to mitigate risk of UTI.   Diverticulosis - I did not see obvious fistula with cystoscopy in bladder or vagina.  That  being said her symptoms and prior CT are suspicious.  I will refer her back to colorectal surgeon to review work-up and imaging.    No follow-ups on file.  Jerilee Field, MD

## 2023-11-10 ENCOUNTER — Ambulatory Visit: Payer: Medicare HMO | Admitting: Family

## 2023-11-10 ENCOUNTER — Other Ambulatory Visit: Payer: Medicare HMO

## 2023-11-10 DIAGNOSIS — Z79899 Other long term (current) drug therapy: Secondary | ICD-10-CM | POA: Diagnosis not present

## 2023-11-10 DIAGNOSIS — E782 Mixed hyperlipidemia: Secondary | ICD-10-CM | POA: Diagnosis not present

## 2023-11-10 DIAGNOSIS — I2510 Atherosclerotic heart disease of native coronary artery without angina pectoris: Secondary | ICD-10-CM | POA: Diagnosis not present

## 2023-11-11 LAB — LIPID PANEL
Chol/HDL Ratio: 2.5 {ratio} (ref 0.0–4.4)
Cholesterol, Total: 194 mg/dL (ref 100–199)
HDL: 77 mg/dL (ref >39)
LDL Chol Calc (NIH): 100 mg/dL — ABNORMAL HIGH (ref 0–99)
Triglycerides: 94 mg/dL (ref 0–149)
VLDL Cholesterol Cal: 17 mg/dL (ref 5–40)

## 2023-11-12 ENCOUNTER — Other Ambulatory Visit: Payer: Medicare HMO | Admitting: Obstetrics and Gynecology

## 2023-11-12 ENCOUNTER — Other Ambulatory Visit: Payer: Medicare HMO

## 2023-11-17 ENCOUNTER — Telehealth: Payer: Self-pay | Admitting: *Deleted

## 2023-11-17 MED ORDER — ATORVASTATIN CALCIUM 80 MG PO TABS: 80.0000 mg | ORAL_TABLET | Freq: Every day | ORAL | 1 refills | Status: DC

## 2023-11-17 NOTE — Telephone Encounter (Signed)
 Notified, copy to pcp.  Will send new 80mg  tablet to [facility] now.

## 2023-11-17 NOTE — Telephone Encounter (Signed)
-----   Message from Dina Rich sent at 11/17/2023  8:13 AM EST ----- Cholesterol improving but still not at goal, please increase atorvastatin to 80mg  daily  Dominga Ferry MD

## 2023-11-20 ENCOUNTER — Ambulatory Visit: Payer: Medicare HMO

## 2023-11-24 ENCOUNTER — Ambulatory Visit (INDEPENDENT_AMBULATORY_CARE_PROVIDER_SITE_OTHER): Payer: Medicare HMO | Admitting: Family

## 2023-11-24 ENCOUNTER — Encounter: Payer: Self-pay | Admitting: Family

## 2023-11-24 VITALS — BP 104/72 | HR 57 | Temp 97.9°F | Wt 136.0 lb

## 2023-11-24 DIAGNOSIS — J4520 Mild intermittent asthma, uncomplicated: Secondary | ICD-10-CM

## 2023-11-24 DIAGNOSIS — M26609 Unspecified temporomandibular joint disorder, unspecified side: Secondary | ICD-10-CM

## 2023-11-24 DIAGNOSIS — G4700 Insomnia, unspecified: Secondary | ICD-10-CM

## 2023-11-24 DIAGNOSIS — I5032 Chronic diastolic (congestive) heart failure: Secondary | ICD-10-CM | POA: Diagnosis not present

## 2023-11-24 DIAGNOSIS — E538 Deficiency of other specified B group vitamins: Secondary | ICD-10-CM | POA: Diagnosis not present

## 2023-11-24 DIAGNOSIS — F411 Generalized anxiety disorder: Secondary | ICD-10-CM

## 2023-11-24 DIAGNOSIS — M810 Age-related osteoporosis without current pathological fracture: Secondary | ICD-10-CM

## 2023-11-24 DIAGNOSIS — E782 Mixed hyperlipidemia: Secondary | ICD-10-CM

## 2023-11-24 DIAGNOSIS — I2510 Atherosclerotic heart disease of native coronary artery without angina pectoris: Secondary | ICD-10-CM | POA: Diagnosis not present

## 2023-11-24 DIAGNOSIS — E559 Vitamin D deficiency, unspecified: Secondary | ICD-10-CM | POA: Diagnosis not present

## 2023-11-24 DIAGNOSIS — I10 Essential (primary) hypertension: Secondary | ICD-10-CM

## 2023-11-24 DIAGNOSIS — K219 Gastro-esophageal reflux disease without esophagitis: Secondary | ICD-10-CM

## 2023-11-24 DIAGNOSIS — F331 Major depressive disorder, recurrent, moderate: Secondary | ICD-10-CM

## 2023-11-24 DIAGNOSIS — D509 Iron deficiency anemia, unspecified: Secondary | ICD-10-CM

## 2023-11-24 DIAGNOSIS — I4891 Unspecified atrial fibrillation: Secondary | ICD-10-CM

## 2023-11-24 MED ORDER — TRIAMCINOLONE ACETONIDE 0.1 % EX CREA: 1.0000 | TOPICAL_CREAM | Freq: Two times a day (BID) | CUTANEOUS | 2 refills | Status: AC

## 2023-11-24 MED ORDER — DULOXETINE HCL 60 MG PO CPEP: 60.0000 mg | ORAL_CAPSULE | Freq: Every day | ORAL | 1 refills | Status: DC

## 2023-11-24 MED ORDER — PANTOPRAZOLE SODIUM 40 MG PO TBEC: 40.0000 mg | DELAYED_RELEASE_TABLET | Freq: Two times a day (BID) | ORAL | 1 refills | Status: DC

## 2023-11-24 MED ORDER — SYMBICORT 80-4.5 MCG/ACT IN AERO: 2.0000 | INHALATION_SPRAY | Freq: Every day | RESPIRATORY_TRACT | 0 refills | Status: DC

## 2023-11-24 MED ORDER — ALBUTEROL SULFATE HFA 108 (90 BASE) MCG/ACT IN AERS: INHALATION_SPRAY | RESPIRATORY_TRACT | 1 refills | Status: DC

## 2023-11-24 MED ORDER — VITAMIN D (ERGOCALCIFEROL) 1.25 MG (50000 UNIT) PO CAPS: 50000.0000 [IU] | ORAL_CAPSULE | ORAL | 1 refills | Status: AC

## 2023-11-24 MED ORDER — CYANOCOBALAMIN 1000 MCG/ML IJ SOLN
1000.0000 ug | Freq: Once | INTRAMUSCULAR | Status: AC
  Administered 2023-11-24: 1000 ug via INTRAMUSCULAR

## 2023-11-24 MED ORDER — FUROSEMIDE 20 MG PO TABS
20.0000 mg | ORAL_TABLET | Freq: Every day | ORAL | 1 refills | Status: DC
Start: 2023-11-24 — End: 2024-05-31

## 2023-11-24 MED ORDER — GABAPENTIN 100 MG PO CAPS: ORAL_CAPSULE | ORAL | 1 refills | Status: AC

## 2023-11-24 NOTE — Progress Notes (Signed)
 Subjective:    Patient ID: Sabrina [name], female    DOB: July 09, 1946, 78 y.o.   MRN: [id number]  Chief Complaint  Patient presents with   Medical Management of Chronic Issues    Wants cream changed no longer helping   Pt presents to the office today for chronic follow up. Pt is followed by Cardiologists every 4 months for A Fib and CHF. Has a pacemaker.   Has atherosclerosis and takes Lipitor daily.    She has osteoporosis and takes Fosamax weekly. Takes Vit D and Calcium daily. Last Dexa scan 12/25/21.   She has peripheral neuropathy and takes gabapentin 100 mg BID.   Hypotension today.   She osteoporosis and her last dexa scan was 12/25/21. Congestive Heart Failure Presents for follow-up visit. Associated symptoms include fatigue. Pertinent negatives include no edema or shortness of breath. The symptoms have been stable.  Asthma There is no cough, shortness of breath or wheezing. This is a chronic problem. The current episode started more than 1 year ago. The problem occurs intermittently. Associated symptoms include heartburn and malaise/fatigue. She reports moderate improvement on treatment. Her past medical history is significant for asthma.  Gastroesophageal Reflux She complains of belching and heartburn. She reports no coughing or no wheezing. This is a chronic problem. The current episode started more than 1 year ago. The problem occurs occasionally. Associated symptoms include fatigue. She has tried a PPI for the symptoms. The treatment provided moderate relief.  Anemia Presents for follow-up visit. Symptoms include malaise/fatigue. There has been no bruising/bleeding easily.  Insomnia Primary symptoms: difficulty falling asleep, frequent awakening, malaise/fatigue.   The current episode started more than one year. The onset quality is gradual. The problem occurs intermittently. Past treatments include medication. The treatment provided moderate relief. PMH includes:  depression.   Hyperlipidemia This is a chronic problem. The current episode started more than 1 year ago. The problem is controlled. Recent lipid tests were reviewed and are normal. Pertinent negatives include no shortness of breath. Current antihyperlipidemic treatment includes statins. The current treatment provides moderate improvement of lipids. Risk factors for coronary artery disease include dyslipidemia, hypertension and a sedentary lifestyle.  Anxiety Presents for follow-up visit. Symptoms include excessive worry, insomnia and nervous/anxious behavior. Patient reports no shortness of breath. Symptoms occur occasionally. The severity of symptoms is mild.   Her past medical history is significant for asthma.  Depression        This is a chronic problem.  The current episode started more than 1 year ago.   The onset quality is gradual.   The problem occurs intermittently.  Associated symptoms include fatigue and insomnia.  Associated symptoms include no helplessness, no hopelessness and not sad.  Past treatments include SNRIs - Serotonin and norepinephrine reuptake inhibitors.  Past medical history includes anxiety.       Review of Systems  Constitutional:  Positive for fatigue and malaise/fatigue.  Respiratory:  Negative for cough, shortness of breath and wheezing.   Gastrointestinal:  Positive for heartburn.  Hematological:  Does not bruise/bleed easily.  Psychiatric/Behavioral:  Positive for depression. The patient is nervous/anxious and has insomnia.   All other systems reviewed and are negative.  Family History  Problem Relation Age of Onset   Cancer Mother    Alzheimer's disease Mother    Heart disease Mother    Arthritis Father    Asthma Daughter    Arthritis Daughter    Obesity Daughter    Colon cancer Paternal Uncle 62  Arthritis Son    Hyperlipidemia Son    Obesity Son    Arthritis Son    Obesity Son    BRCA 1/2 Neg Hx    Social History   Socioeconomic History    Marital status: Divorced    Spouse name: Not on file   Number of children: 3   Years of education: 2 years of college   Highest education level: Some college, no degree  Occupational History   Occupation: Retired  Tobacco Use   Smoking status: Former    Current packs/day: 0.00    Average packs/day: 0.5 packs/day for 25.0 years (12.5 ttl pk-yrs)    Types: Cigarettes    Start date: 02/27/1968    Quit date: 02/26/1993    Years since quitting: 30.7   Smokeless tobacco: Never  Vaping Use   Vaping status: Never Used  Substance and Sexual Activity   Alcohol use: Not Currently    Comment: 08/27/2017 "nothing since early 2000s"   Drug use: Not Currently   Sexual activity: Not Currently    Birth control/protection: Surgical    Comment: Hyst, Menarche-[age], First IC @ [age], Partners >5, DES-neg  Other Topics Concern   Not on file  Social History Narrative   ** Merged History Encounter **       Pt is right handed Lives in single story home with her grandson Has 3 adult children Associated degree  Retired Lawyer   Social Drivers of Corporate investment banker Strain: Low Risk  (03/04/2023)   Overall Financial Resource Strain (CARDIA)    Difficulty of Paying Living Expenses: Not hard at all  Food Insecurity: No Food Insecurity (03/04/2023)   Hunger Vital Sign    Worried About Running Out of Food in the Last Year: Never true    Ran Out of Food in the Last Year: Never true  Transportation Needs: No Transportation Needs (03/04/2023)   PRAPARE - Administrator, Civil Service (Medical): No    Lack of Transportation (Non-Medical): No  Physical Activity: Insufficiently Active (03/04/2023)   Exercise Vital Sign    Days of Exercise per Week: 3 days    Minutes of Exercise per Session: 30 min  Stress: No Stress Concern Present (03/04/2023)   [employer] of Occupational Health - Occupational Stress Questionnaire    Feeling of Stress : Not at all  Social Connections: Moderately  Integrated (03/04/2023)   Social Connection and Isolation Panel [NHANES]    Frequency of Communication with Friends and Family: More than three times a week    Frequency of Social Gatherings with Friends and Family: More than three times a week    Attends Religious Services: More than 4 times per year    Active Member of [employer] or Organizations: Yes    Attends Engineer, structural: More than 4 times per year    Marital Status: Divorced       Objective:   Physical Exam Vitals reviewed.  Constitutional:      General: She is not in acute distress.    Appearance: She is well-developed.  HENT:     Head: Normocephalic and atraumatic.     Right Ear: Tympanic membrane normal.     Left Ear: Tympanic membrane normal.  Eyes:     Pupils: Pupils are equal, round, and reactive to light.  Neck:     Thyroid: No thyromegaly.  Cardiovascular:     Rate and Rhythm: Normal rate and regular rhythm.  Heart sounds: Normal heart sounds. No murmur heard. Pulmonary:     Effort: Pulmonary effort is normal. No respiratory distress.     Breath sounds: Normal breath sounds. No wheezing.  Abdominal:     General: Bowel sounds are normal. There is no distension.     Palpations: Abdomen is soft.     Tenderness: There is no abdominal tenderness.  Musculoskeletal:        General: No tenderness. Normal range of motion.     Cervical back: Normal range of motion and neck supple.  Skin:    General: Skin is warm and dry.  Neurological:     Mental Status: She is alert and oriented to person, place, and time.     Cranial Nerves: No cranial nerve deficit.     Deep Tendon Reflexes: Reflexes are normal and symmetric.  Psychiatric:        Behavior: Behavior normal.        Thought Content: Thought content normal.        Judgment: Judgment normal.      BP 92/67   Pulse (!) 57   Temp 97.9 F (36.6 C) (Temporal)   Wt 136 lb (61.7 kg)   SpO2 96%   BMI 23.34 kg/m       Assessment & Plan:    Sabrina [name] comes in today with chief complaint of Medical Management of Chronic Issues (Wants cream changed no longer helping)   Diagnosis and orders addressed:  1. GAD (generalized anxiety disorder) - DULoxetine (CYMBALTA) 60 MG capsule; Take 1 capsule (60 mg total) by mouth daily.  Dispense: 90 capsule; Refill: 1 - CMP14+EGFR - CBC with Differential/Platelet  2. TMJ (temporomandibular joint disorder) - gabapentin (NEURONTIN) 100 MG capsule; 100 mg in AM and 300 mg in evening  Dispense: 360 capsule; Refill: 1 - CMP14+EGFR - CBC with Differential/Platelet  3. Gastroesophageal reflux disease, unspecified whether esophagitis present - pantoprazole (PROTONIX) 40 MG tablet; Take 1 tablet (40 mg total) by mouth 2 (two) times daily.  Dispense: 180 tablet; Refill: 1 - CMP14+EGFR - CBC with Differential/Platelet  4. B12 deficiency - cyanocobalamin (VITAMIN B12) injection 1,000 mcg - CMP14+EGFR - CBC with Differential/Platelet  5. Benign essential HTN (Primary) - CMP14+EGFR - CBC with Differential/Platelet  6. Chronic diastolic heart failure (HCC) - CMP14+EGFR - CBC with Differential/Platelet  7. Vitamin D deficiency - Vitamin D, Ergocalciferol, (DRISDOL) 1.25 MG (50000 UNIT) CAPS capsule; Take 1 capsule (50,000 Units total) by mouth every 7 (seven) days.  Dispense: 12 capsule; Refill: 1 - CMP14+EGFR - CBC with Differential/Platelet  8. Mild intermittent asthma without complication - albuterol (VENTOLIN HFA) 108 (90 Base) MCG/ACT inhaler; INHALE 2 PUFFS BY MOUTH EVERY 6 HOURS AS NEEDED FOR WHEEZE OR SHORTNESS OF BREATH  Dispense: 1 each; Refill: 1 - SYMBICORT 80-4.5 MCG/ACT inhaler; Inhale 2 puffs into the lungs daily.  Dispense: 30.6 each; Refill: 0 - CMP14+EGFR - CBC with Differential/Platelet  9. Generalized anxiety disorder  - CMP14+EGFR - CBC with Differential/Platelet  10. Moderate episode of recurrent major depressive disorder (HCC) - CMP14+EGFR - CBC with  Differential/Platelet  11. Insomnia, unspecified type  - CMP14+EGFR - CBC with Differential/Platelet  12. Iron deficiency anemia, unspecified iron deficiency anemia type - CMP14+EGFR - CBC with Differential/Platelet  13. Atherosclerosis of native coronary artery of native heart without angina pectoris  - CMP14+EGFR - CBC with Differential/Platelet  14. Mixed hyperlipidemia  - CMP14+EGFR - CBC with Differential/Platelet  15. Atrial fibrillation with RVR (HCC) - CMP14+EGFR -  CBC with Differential/Platelet  16. Osteoporosis, unspecified osteoporosis type, unspecified pathological fracture presence  - CMP14+EGFR - CBC with Differential/Platelet   Labs pending Continue current medications  Keep follow up with Cardiologists  Health Maintenance reviewed Diet and exercise encouraged  Follow up plan: 3 month   Jannifer Rodney, FNP

## 2023-11-24 NOTE — Patient Instructions (Signed)

## 2023-11-25 LAB — CBC WITH DIFFERENTIAL/PLATELET
Basophils Absolute: 0.1 10*3/uL (ref 0.0–0.2)
Basos: 1 %
EOS (ABSOLUTE): 0.2 10*3/uL (ref 0.0–0.4)
Eos: 3 %
Hematocrit: 40.9 % (ref 34.0–46.6)
Hemoglobin: 13.4 g/dL (ref 11.1–15.9)
Immature Grans (Abs): 0 10*3/uL (ref 0.0–0.1)
Immature Granulocytes: 0 %
Lymphocytes Absolute: 1.7 10*3/uL (ref 0.7–3.1)
Lymphs: 21 %
MCH: 28.2 pg (ref 26.6–33.0)
MCHC: 32.8 g/dL (ref 31.5–35.7)
MCV: 86 fL (ref 79–97)
Monocytes Absolute: 0.4 10*3/uL (ref 0.1–0.9)
Monocytes: 5 %
Neutrophils Absolute: 5.9 10*3/uL (ref 1.4–7.0)
Neutrophils: 70 %
Platelets: 366 10*3/uL (ref 150–450)
RBC: 4.75 x10E6/uL (ref 3.77–5.28)
RDW: 14.4 % (ref 11.7–15.4)
WBC: 8.4 10*3/uL (ref 3.4–10.8)

## 2023-11-25 LAB — CMP14+EGFR
ALT: 21 IU/L (ref 0–32)
AST: 34 IU/L (ref 0–40)
Albumin: 4.1 g/dL (ref 3.8–4.8)
Alkaline Phosphatase: 126 IU/L — ABNORMAL HIGH (ref 44–121)
BUN/Creatinine Ratio: 18 (ref 12–28)
BUN: 16 mg/dL (ref 8–27)
Bilirubin Total: 0.6 mg/dL (ref 0.0–1.2)
CO2: 25 mmol/L (ref 20–29)
Calcium: 9.3 mg/dL (ref 8.7–10.3)
Chloride: 97 mmol/L (ref 96–106)
Creatinine, Ser: 0.91 mg/dL (ref 0.57–1.00)
Globulin, Total: 2.4 g/dL (ref 1.5–4.5)
Glucose: 85 mg/dL (ref 70–99)
Potassium: 4.9 mmol/L (ref 3.5–5.2)
Sodium: 139 mmol/L (ref 134–144)
Total Protein: 6.5 g/dL (ref 6.0–8.5)
eGFR: 65 mL/min/{1.73_m2} (ref >59)

## 2023-12-02 ENCOUNTER — Encounter: Payer: Self-pay | Admitting: Family Medicine

## 2023-12-24 LAB — CUP PACEART REMOTE DEVICE CHECK
Battery Remaining Longevity: 31 mo
Battery Voltage: 2.85 V
Brady Statistic AP VP Percent: 0.12 %
Brady Statistic AP VS Percent: 99.85 %
Brady Statistic AS VP Percent: 0 %
Brady Statistic AS VS Percent: 0.03 %
Brady Statistic RA Percent Paced: 100 %
Brady Statistic RV Percent Paced: 0.12 %
Date Time Interrogation Session: 20250402084449
Implantable Lead Connection Status: 753985
Implantable Lead Connection Status: 753985
Implantable Lead Implant Date: 20181206
Implantable Lead Implant Date: 20181206
Implantable Lead Location: 753860
Implantable Lead Location: 753860
Implantable Lead Model: 3830
Implantable Lead Model: 5076
Implantable Pulse Generator Implant Date: 20181206
Lead Channel Impedance Value: 266 Ohm
Lead Channel Impedance Value: 342 Ohm
Lead Channel Impedance Value: 380 Ohm
Lead Channel Impedance Value: 532 Ohm
Lead Channel Sensing Intrinsic Amplitude: 17.875 mV
Lead Channel Sensing Intrinsic Amplitude: 17.875 mV
Lead Channel Sensing Intrinsic Amplitude: 3.625 mV
Lead Channel Sensing Intrinsic Amplitude: 4.75 mV
Lead Channel Setting Pacing Amplitude: 2 V
Lead Channel Setting Pacing Amplitude: 2.5 V
Lead Channel Setting Pacing Pulse Width: 0.3 ms
Lead Channel Setting Sensing Sensitivity: 2 mV
Zone Setting Status: 755011
Zone Setting Status: 755011

## 2023-12-25 ENCOUNTER — Ambulatory Visit (INDEPENDENT_AMBULATORY_CARE_PROVIDER_SITE_OTHER)

## 2023-12-25 DIAGNOSIS — E538 Deficiency of other specified B group vitamins: Secondary | ICD-10-CM | POA: Diagnosis not present

## 2023-12-25 MED ORDER — CYANOCOBALAMIN 1000 MCG/ML IJ SOLN
1000.0000 ug | INTRAMUSCULAR | Status: AC
  Administered 2023-12-25 – 2024-10-03 (×11): 1000 ug via INTRAMUSCULAR

## 2023-12-25 NOTE — Progress Notes (Signed)
 Patient is in office today for a nurse visit for B12 Injection. Patient Injection was given in the  Right deltoid. Patient tolerated injection well.

## 2023-12-28 ENCOUNTER — Ambulatory Visit (INDEPENDENT_AMBULATORY_CARE_PROVIDER_SITE_OTHER): Payer: Medicare HMO

## 2023-12-28 DIAGNOSIS — I4891 Unspecified atrial fibrillation: Secondary | ICD-10-CM

## 2023-12-28 DIAGNOSIS — I442 Atrioventricular block, complete: Secondary | ICD-10-CM

## 2024-01-03 ENCOUNTER — Ambulatory Visit
Admission: EM | Admit: 2024-01-03 | Discharge: 2024-01-03 | Disposition: A | Discharge status: Home / Self Care | Attending: Internal Medicine | Admitting: Internal Medicine

## 2024-01-03 DIAGNOSIS — J019 Acute sinusitis, unspecified: Secondary | ICD-10-CM

## 2024-01-03 MED ORDER — AMOXICILLIN-POT CLAVULANATE 875-125 MG PO TABS: 1.0000 | ORAL_TABLET | Freq: Two times a day (BID) | ORAL | 0 refills | Status: DC

## 2024-01-03 NOTE — Discharge Instructions (Signed)
 Your evaluation shows you have a bacterial sinus infection. - Take antibiotic sent to pharmacy as directed to treat sinus infection. - Purchase Mucinex over the counter and take this every 12 hours as needed for nasal congestion and cough. - Warm compresses to the cheeks and forehead as needed to help with sinus headaches as well as tylenol as needed.  If you develop any new or worsening symptoms or if your symptoms do not start to improve, please return here or follow-up with your primary care provider. If your symptoms are severe, please go to the emergency room.

## 2024-01-03 NOTE — ED Triage Notes (Signed)
 Pt reports possible ear infection, and sinus infection, loss of taste and smell coughing up some mucus but not a lot, pt states she has had sx's x 2 weeks.

## 2024-01-03 NOTE — ED Provider Notes (Signed)
 [facility]    CSN: [id number] Arrival date & time: 01/03/24  1240      History   Chief Complaint No chief complaint on file.   HPI Sabrina [name] is a 78 y.o. female.   Patient presents to urgent care for evaluation of nasal congestion, maxillary sinus pain, generalized frontal headache, and bilateral ear "pressure" that started approximately 2 weeks ago.  Nasal congestion is thick, green, and yellow.  She did not have a fever throughout entire illness until yesterday when her temperature rose to 100.1.  Elevated temperature responded well to Tylenol, currently afebrile.  Denies cough, shortness of breath, chest pain, heart palpitations, dizziness, drainage from the ears, decreased hearing to the ears, tinnitus, and recent falls.  History of CHF, asthma, and COPD.  She has not needed any rescue inhaler during this illness.  Denies recent antibiotics in the last 90 days, no allergies to antibiotics.  She is using Alka-Seltzer cold and flu as well as Mucinex with minimal relief of symptoms.     Past Medical History:  Diagnosis Date   Anemia    Ankle fracture, right    past hx. -"no surgery"   Anxiety    Asthma    CHF (congestive heart failure) (HCC) 2009   Chronic lower back pain    Collagen vascular disease (HCC)    COPD (chronic obstructive pulmonary disease) (HCC)    Depression    Fibromyalgia    GERD (gastroesophageal reflux disease)    Hyperlipidemia    Hypertension    Immature cataract of both eyes    Myocardial infarction (HCC)    "I've had a light one; don't know when it happened" (08/27/2017)   Osteoarthritis    Peripheral neuropathy    legs and feet   Persistent atrial fibrillation (HCC)    Presence of Watchman left atrial appendage closure device 08/01/2021   24 mm Watchman FLXDevice LOT # [id number] by Dr. Marven Slimmer   Tremors of nervous system    noted in hands by pt last 6 months   Tubular adenoma of colon     Patient Active Problem List    Diagnosis Date Noted   Rectocele 09/21/2023   Pneumatosis vaginalis 09/21/2023   Coronary artery calcification    Presence of Watchman left atrial appendage closure device 08/01/2021   History of DVT (deep vein thrombosis) 05/07/2021   Atherosclerotic heart disease of native coronary artery without angina pectoris 12/29/2020   Muscle weakness (generalized) 12/29/2020   Other enthesopathy of unspecified foot and ankle 12/29/2020   Presence of other vascular implants and grafts 12/29/2020   Calcaneal bursitis 12/12/2020   Complete heart block (HCC) 11/09/2020   Osteoporosis 10/11/2019   Pacemaker 10/04/2019   Near syncope    Hyperlipidemia 06/23/2016   Atrial fibrillation with RVR (HCC) 06/12/2016   GERD (gastroesophageal reflux disease) 06/12/2016   Fibromyalgia 06/12/2016   Peripheral neuropathy 06/12/2016   S/P gastric bypass 01/22/2016   S/P splenectomy 01/22/2016   S/P cholecystectomy 01/22/2016   Hx of small bowel obstruction 01/22/2016   Asthma 10/19/2015   Generalized anxiety disorder 10/19/2015   Depression 10/19/2015   Insomnia 10/19/2015   Iron deficiency anemia 10/19/2015   Vitamin D deficiency 11/29/2014   Eczema 11/27/2014   Benign essential HTN 09/25/2014   Chronic diastolic heart failure (HCC) 09/25/2014   Allergic rhinitis 02/28/2013    Past Surgical History:  Procedure Laterality Date   APPENDECTOMY     AV NODE ABLATION N/A 08/27/2017  Procedure: AV NODE ABLATION;  Surgeon: Tammie Fall, MD;  Location: [facility] INVASIVE CV LAB;  Service: Cardiovascular;  Laterality: N/A;   CATARACT EXTRACTION W/PHACO Right 03/16/2023   Procedure: CATARACT EXTRACTION PHACO AND INTRAOCULAR LENS PLACEMENT (IOC);  Surgeon: Tarri Farm, MD;  Location: [facility];  Service: Ophthalmology;  Laterality: Right;  CDE: 10.62   CATARACT EXTRACTION W/PHACO Left 05/08/2023   Procedure: CATARACT EXTRACTION PHACO AND INTRAOCULAR LENS PLACEMENT (IOC);  Surgeon: Tarri Farm, MD;  Location: [facility]  ORS;  Service: Ophthalmology;  Laterality: Left;  CDE 11.28   CHOLECYSTECTOMY OPEN  1978   DILATION AND CURETTAGE OF UTERUS     FEMUR FRACTURE SURGERY Left 2013   "put 7" rod in it"   FRACTURE SURGERY     IVC FILTER REMOVAL N/A 06/20/2021   Procedure: IVC FILTER REMOVAL;  Surgeon: Young Hensen, MD;  Location: [facility] INVASIVE CV LAB;  Service: Cardiovascular;  Laterality: N/A;   LAPAROSCOPY  08/22/2016   Procedure: LAPAROSCOPY DIAGNOSTIC;  Surgeon: Joyce Nixon, MD;  Location: [facility];  Service: General;;   LEFT ATRIAL APPENDAGE OCCLUSION N/A 08/01/2021   Procedure: LEFT ATRIAL APPENDAGE OCCLUSION;  Surgeon: Boyce Byes, MD;  Location: [facility] INVASIVE CV LAB;  Service: Cardiovascular;  Laterality: N/A;   LEFT HEART CATH AND CORONARY ANGIOGRAPHY N/A 02/06/2022   Procedure: LEFT HEART CATH AND CORONARY ANGIOGRAPHY;  Surgeon: Millicent Ally, MD;  Location: [facility] INVASIVE CV LAB;  Service: Cardiovascular;  Laterality: N/A;   MEDIAL PARTIAL KNEE REPLACEMENT Right 2005   "@ [facility]"   PACEMAKER IMPLANT N/A 08/27/2017   Procedure: PACEMAKER IMPLANT;  Surgeon: Tammie Fall, MD;  Location: [facility] INVASIVE CV LAB;  Service: Cardiovascular;  Laterality: N/A;   ROUX-EN-Y GASTRIC BYPASS  2002   [facility]   SPLENECTOMY  2002   TEE WITHOUT CARDIOVERSION N/A 08/01/2021   Procedure: TRANSESOPHAGEAL ECHOCARDIOGRAM (TEE);  Surgeon: Boyce Byes, MD;  Location: [facility] INVASIVE CV LAB;  Service: Cardiovascular;  Laterality: N/A;   TONSILLECTOMY  1944   TUBAL LIGATION     VAGINAL HYSTERECTOMY      OB History     Gravida  5   Para  3   Term  3   Preterm      AB  2   Living         SAB  2   IAB      Ectopic      Multiple      Live Births  3            Home Medications    Prior to Admission medications   Medication Sig Start Date End Date Taking? Authorizing Provider  amoxicillin-clavulanate (AUGMENTIN) 875-125 MG tablet Take 1 tablet by mouth every 12 (twelve)  hours. 01/03/24  Yes Starlene Eaton, FNP  albuterol (VENTOLIN HFA) 108 (90 Base) MCG/ACT inhaler INHALE 2 PUFFS BY MOUTH EVERY 6 HOURS AS NEEDED FOR WHEEZE OR SHORTNESS OF BREATH 11/24/23   Tommas Fragmin A, FNP  aspirin EC 81 MG EC tablet Take 1 tablet (81 mg total) by mouth daily. Swallow whole. 08/02/21   McDaniel, Jill D, NP  atorvastatin (LIPITOR) 80 MG tablet Take 1 tablet (80 mg total) by mouth daily. 11/17/23   Laurann Pollock, MD  blood glucose meter kit and supplies Dispense based on patient and insurance preference. Use up to four times daily as directed. (FOR ICD-10 E10.9, E11.9). 02/11/22   Lorel Roes, NP  Blood Glucose  Monitoring Suppl (TRUE METRIX AIR GLUCOSE METER) w/Device KIT Test BS daily Dx R73.09 02/12/22   Ijaola, Onyeje M, NP  cromolyn (NASALCROM) 5.2 MG/ACT nasal spray Place 1 spray into both nostrils 2 (two) times daily as needed for allergies.    [provider]  DULoxetine (CYMBALTA) 60 MG capsule Take 1 capsule (60 mg total) by mouth daily. 11/24/23   Yevette Hem, FNP  estradiol (ESTRACE) 0.1 MG/GM vaginal cream Place 1 Applicatorful vaginally at bedtime. 09/21/23   Lauretta Ponto, FNP  ferrous sulfate 325 (65 FE) MG tablet Take 325 mg by mouth once a week.    [provider]  fluticasone (FLONASE) 50 MCG/ACT nasal spray SPRAY 2 SPRAYS INTO EACH NOSTRIL EVERY DAY 06/19/23   Tommas Fragmin A, FNP  furosemide (LASIX) 20 MG tablet Take 1 tablet (20 mg total) by mouth daily. 11/24/23   Yevette Hem, FNP  gabapentin (NEURONTIN) 100 MG capsule 100 mg in AM and 300 mg in evening 11/24/23   Tommas Fragmin A, FNP  glucose blood (TRUE METRIX BLOOD GLUCOSE TEST) test strip Test BS daily Dx R73.09 02/12/22   Ijaola, Onyeje M, NP  methenamine (HIPREX) 1 g tablet Take 1 tablet (1 g total) by mouth 2 (two) times daily with a meal. Most effective when taken with a daily Vitamin C supplement. 09/21/23   Lauretta Ponto, FNP  Multiple Vitamin (MULTIVITAMIN  WITH MINERALS) TABS tablet Take 1 tablet by mouth in the morning.    [provider]  nitroGLYCERIN (NITROSTAT) 0.4 MG SL tablet Place 1 tablet (0.4 mg total) under the tongue every 5 (five) minutes x 3 doses as needed for chest pain. 02/07/22   Bhagat, Annia Kilts, PA  Omega-3 Fatty Acids (FISH OIL PO) Take 1 capsule by mouth daily.    [provider]  pantoprazole (PROTONIX) 40 MG tablet Take 1 tablet (40 mg total) by mouth 2 (two) times daily. 11/24/23   Tommas Fragmin A, FNP  potassium chloride SA (KLOR-CON M) 20 MEQ tablet Take 1 tablet (20 mEq total) by mouth 2 (two) times daily. Take 1 tablet twice a day 02/26/22   Bhagat, Bhavinkumar, PA  SYMBICORT 80-4.5 MCG/ACT inhaler Inhale 2 puffs into the lungs daily. 11/24/23   Tommas Fragmin A, FNP  triamcinolone cream (KENALOG) 0.1 % Apply 1 Application topically 2 (two) times daily. 11/24/23   Yevette Hem, FNP  TRUEplus Lancets 33G MISC Test BS daily Dx R73.09 02/12/22   Ijaola, Onyeje M, NP  vitamin C (ASCORBIC ACID) 500 MG tablet Take 500 mg by mouth daily.    [provider]  Vitamin D, Ergocalciferol, (DRISDOL) 1.25 MG (50000 UNIT) CAPS capsule Take 1 capsule (50,000 Units total) by mouth every 7 (seven) days. 11/24/23   Yevette Hem, FNP    Family History Family History  Problem Relation Age of Onset   Cancer Mother    Alzheimer's disease Mother    Heart disease Mother    Arthritis Father    Asthma Daughter    Arthritis Daughter    Obesity Daughter    Colon cancer Paternal Uncle 67   Arthritis Son    Hyperlipidemia Son    Obesity Son    Arthritis Son    Obesity Son    BRCA 1/2 Neg Hx     Social History Social History   Tobacco Use   Smoking status: Former    Current packs/day: 0.00    Average packs/day: 0.5 packs/day for 25.0 years (  12.5 ttl pk-yrs)    Types: Cigarettes    Start date: 02/27/1968    Quit date: 02/26/1993    Years since quitting: 30.8   Smokeless tobacco: Never  Vaping Use   Vaping  status: Never Used  Substance Use Topics   Alcohol use: Not Currently    Comment: 08/27/2017 "nothing since early 2000s"   Drug use: Not Currently     Allergies   Codeine   Review of Systems Review of Systems Per HPI  Physical Exam Triage Vital Signs ED Triage Vitals  Encounter Vitals Group     BP 01/03/24 1252 113/67     Systolic BP Percentile --      Diastolic BP Percentile --      Pulse Rate 01/03/24 1252 74     Resp 01/03/24 1252 14     Temp 01/03/24 1252 97.8 F (36.6 C)     Temp Source 01/03/24 1252 Oral     SpO2 01/03/24 1252 98 %     Weight --      Height --      Head Circumference --      Peak Flow --      Pain Score 01/03/24 1255 4     Pain Loc --      Pain Education --      Exclude from Growth Chart --    No data found.  Updated Vital Signs BP 113/67 (BP Location: Right Arm)   Pulse 74   Temp 97.8 F (36.6 C) (Oral)   Resp 14   SpO2 98%   Visual Acuity Right Eye Distance:   Left Eye Distance:   Bilateral Distance:    Right Eye Near:   Left Eye Near:    Bilateral Near:     Physical Exam Vitals and nursing note reviewed.  Constitutional:      Appearance: She is not ill-appearing or toxic-appearing.  HENT:     Head: Normocephalic and atraumatic.     Right Ear: Hearing, tympanic membrane, ear canal and external ear normal.     Left Ear: Hearing, tympanic membrane, ear canal and external ear normal.     Nose: Congestion present.     Right Sinus: Maxillary sinus tenderness and frontal sinus tenderness present.     Left Sinus: Maxillary sinus tenderness and frontal sinus tenderness present.     Mouth/Throat:     Lips: Pink.     Mouth: Mucous membranes are moist. No injury or oral lesions.     Dentition: Normal dentition.     Tongue: No lesions.     Pharynx: Oropharynx is clear. Uvula midline. No pharyngeal swelling, oropharyngeal exudate, posterior oropharyngeal erythema, uvula swelling or postnasal drip.     Tonsils: No tonsillar exudate.   Eyes:     General: Lids are normal. Vision grossly intact. Gaze aligned appropriately.     Extraocular Movements: Extraocular movements intact.     Conjunctiva/sclera: Conjunctivae normal.  Neck:     Trachea: Trachea and phonation normal.  Cardiovascular:     Rate and Rhythm: Normal rate and regular rhythm.     Heart sounds: Normal heart sounds, S1 normal and S2 normal.  Pulmonary:     Effort: Pulmonary effort is normal. No respiratory distress.     Breath sounds: Normal breath sounds and air entry.  Musculoskeletal:     Cervical back: Neck supple.  Lymphadenopathy:     Cervical: Cervical adenopathy present.  Skin:    General: Skin is warm and  dry.     Capillary Refill: Capillary refill takes less than 2 seconds.     Findings: No rash.  Neurological:     General: No focal deficit present.     Mental Status: She is alert and oriented to person, place, and time. Mental status is at baseline.     Cranial Nerves: No dysarthria or facial asymmetry.  Psychiatric:        Mood and Affect: Mood normal.        Speech: Speech normal.        Behavior: Behavior normal.        Thought Content: Thought content normal.        Judgment: Judgment normal.      UC Treatments / Results  Labs (all labs ordered are listed, but only abnormal results are displayed) Labs Reviewed - No data to display  EKG   Radiology No results found.  Procedures Procedures (including critical care time)  Medications Ordered in UC Medications - No data to display  Initial Impression / Assessment and Plan / UC Course  I have reviewed the triage vital signs and the nursing notes.  Pertinent labs & imaging results that were available during my care of the patient were reviewed by me and considered in my medical decision making (see chart for details).   1.  Acute sinusitis with symptoms greater than 10 days Presentation is consistent with acute postviral bacterial sinusitis.   Symptoms have been  present for greater than 10 days and have not responded well to over-the-counter therapies, therefore may have antibiotic.  Prescriptions for further symptomatic relief sent, may continue using OTC medications as needed. Deferred imaging of the chest based on stable cardiopulmonary exam and hemodynamically stable vital signs. Recommend warm compresses to the sinuses as needed.   Counseled patient on potential for adverse effects with medications prescribed/recommended today, strict ER and return-to-clinic precautions discussed, patient verbalized understanding.    Final Clinical Impressions(s) / UC Diagnoses   Final diagnoses:  Acute sinusitis with symptoms > 10 days     Discharge Instructions      Your evaluation shows you have a bacterial sinus infection. - Take antibiotic sent to pharmacy as directed to treat sinus infection. - Purchase Mucinex over the counter and take this every 12 hours as needed for nasal congestion and cough. - Warm compresses to the cheeks and forehead as needed to help with sinus headaches as well as tylenol as needed.  If you develop any new or worsening symptoms or if your symptoms do not start to improve, please return here or follow-up with your primary care provider. If your symptoms are severe, please go to the emergency room.    ED Prescriptions     Medication Sig Dispense Auth. Provider   amoxicillin-clavulanate (AUGMENTIN) 875-125 MG tablet Take 1 tablet by mouth every 12 (twelve) hours. 14 tablet Starlene Eaton, FNP      PDMP not reviewed this encounter.   Starlene Eaton, [place] 01/03/24 1310

## 2024-01-25 ENCOUNTER — Ambulatory Visit

## 2024-01-26 ENCOUNTER — Ambulatory Visit (INDEPENDENT_AMBULATORY_CARE_PROVIDER_SITE_OTHER)

## 2024-01-26 DIAGNOSIS — E538 Deficiency of other specified B group vitamins: Secondary | ICD-10-CM

## 2024-01-26 NOTE — Progress Notes (Signed)
 Patient is in office today for a nurse visit for B12 Injection. Patient Injection was given in the  Left deltoid. Patient tolerated injection well.

## 2024-01-28 ENCOUNTER — Encounter: Payer: Self-pay | Admitting: Gastroenterology

## 2024-02-02 ENCOUNTER — Other Ambulatory Visit: Payer: Self-pay | Admitting: Family

## 2024-02-02 DIAGNOSIS — J4520 Mild intermittent asthma, uncomplicated: Secondary | ICD-10-CM

## 2024-02-08 ENCOUNTER — Ambulatory Visit: Payer: Medicare HMO | Admitting: Urology

## 2024-02-09 NOTE — Progress Notes (Signed)
 Remote pacemaker transmission.

## 2024-02-29 ENCOUNTER — Ambulatory Visit (INDEPENDENT_AMBULATORY_CARE_PROVIDER_SITE_OTHER): Admitting: *Deleted

## 2024-02-29 DIAGNOSIS — E538 Deficiency of other specified B group vitamins: Secondary | ICD-10-CM

## 2024-02-29 NOTE — Progress Notes (Signed)
 Patient is in office today for a nurse visit for B12 Injection. Patient Injection was given in the  Right deltoid. Patient tolerated injection well.

## 2024-03-01 ENCOUNTER — Ambulatory Visit (INDEPENDENT_AMBULATORY_CARE_PROVIDER_SITE_OTHER)

## 2024-03-01 ENCOUNTER — Other Ambulatory Visit: Payer: Self-pay | Admitting: Family

## 2024-03-01 ENCOUNTER — Encounter: Payer: Self-pay | Admitting: Family

## 2024-03-01 ENCOUNTER — Ambulatory Visit: Admitting: Family

## 2024-03-01 VITALS — BP 122/82 | HR 85 | Temp 97.5°F | Ht 64.0 in | Wt 135.0 lb

## 2024-03-01 DIAGNOSIS — M810 Age-related osteoporosis without current pathological fracture: Secondary | ICD-10-CM | POA: Diagnosis not present

## 2024-03-01 DIAGNOSIS — J4520 Mild intermittent asthma, uncomplicated: Secondary | ICD-10-CM

## 2024-03-01 DIAGNOSIS — E782 Mixed hyperlipidemia: Secondary | ICD-10-CM

## 2024-03-01 DIAGNOSIS — G4700 Insomnia, unspecified: Secondary | ICD-10-CM | POA: Diagnosis not present

## 2024-03-01 DIAGNOSIS — K219 Gastro-esophageal reflux disease without esophagitis: Secondary | ICD-10-CM

## 2024-03-01 DIAGNOSIS — L84 Corns and callosities: Secondary | ICD-10-CM

## 2024-03-01 DIAGNOSIS — I4891 Unspecified atrial fibrillation: Secondary | ICD-10-CM

## 2024-03-01 DIAGNOSIS — D509 Iron deficiency anemia, unspecified: Secondary | ICD-10-CM | POA: Diagnosis not present

## 2024-03-01 DIAGNOSIS — F411 Generalized anxiety disorder: Secondary | ICD-10-CM

## 2024-03-01 DIAGNOSIS — Z780 Asymptomatic menopausal state: Secondary | ICD-10-CM

## 2024-03-01 DIAGNOSIS — I2510 Atherosclerotic heart disease of native coronary artery without angina pectoris: Secondary | ICD-10-CM

## 2024-03-01 DIAGNOSIS — E559 Vitamin D deficiency, unspecified: Secondary | ICD-10-CM

## 2024-03-01 DIAGNOSIS — Z0001 Encounter for general adult medical examination with abnormal findings: Secondary | ICD-10-CM

## 2024-03-01 DIAGNOSIS — F331 Major depressive disorder, recurrent, moderate: Secondary | ICD-10-CM | POA: Diagnosis not present

## 2024-03-01 DIAGNOSIS — I5032 Chronic diastolic (congestive) heart failure: Secondary | ICD-10-CM

## 2024-03-01 DIAGNOSIS — I10 Essential (primary) hypertension: Secondary | ICD-10-CM

## 2024-03-01 DIAGNOSIS — Z0000 Encounter for general adult medical examination without abnormal findings: Secondary | ICD-10-CM | POA: Diagnosis not present

## 2024-03-01 LAB — LIPID PANEL

## 2024-03-01 NOTE — Patient Instructions (Signed)
 Health Maintenance After Age 77 After age 4, you are at a higher risk for certain long-term diseases and infections as well as injuries from falls. Falls are a major cause of broken bones and head injuries in people who are older than age 47. Getting regular preventive care can help to keep you healthy and well. Preventive care includes getting regular testing and making lifestyle changes as recommended by your health care provider. Talk with your health care provider about: Which screenings and tests you should have. A screening is a test that checks for a disease when you have no symptoms. A diet and exercise plan that is right for you. What should I know about screenings and tests to prevent falls? Screening and testing are the best ways to find a health problem early. Early diagnosis and treatment give you the best chance of managing medical conditions that are common after age 37. Certain conditions and lifestyle choices may make you more likely to have a fall. Your health care provider may recommend: Regular vision checks. Poor vision and conditions such as cataracts can make you more likely to have a fall. If you wear glasses, make sure to get your prescription updated if your vision changes. Medicine review. Work with your health care provider to regularly review all of the medicines you are taking, including over-the-counter medicines. Ask your health care provider about any side effects that may make you more likely to have a fall. Tell your health care provider if any medicines that you take make you feel dizzy or sleepy. Strength and balance checks. Your health care provider may recommend certain tests to check your strength and balance while standing, walking, or changing positions. Foot health exam. Foot pain and numbness, as well as not wearing proper footwear, can make you more likely to have a fall. Screenings, including: Osteoporosis screening. Osteoporosis is a condition that causes  the bones to get weaker and break more easily. Blood pressure screening. Blood pressure changes and medicines to control blood pressure can make you feel dizzy. Depression screening. You may be more likely to have a fall if you have a fear of falling, feel depressed, or feel unable to do activities that you used to do. Alcohol use screening. Using too much alcohol can affect your balance and may make you more likely to have a fall. Follow these instructions at home: Lifestyle Do not drink alcohol if: Your health care provider tells you not to drink. If you drink alcohol: Limit how much you have to: 0-1 drink a day for women. 0-2 drinks a day for men. Know how much alcohol is in your drink. In the [place] one drink equals one 12 oz bottle of beer (355 mL), one 5 oz glass of wine (148 mL), or one 1 oz glass of hard liquor (44 mL). Do not use any products that contain nicotine or tobacco. These products include cigarettes, chewing tobacco, and vaping devices, such as e-cigarettes. If you need help quitting, ask your health care provider. Activity  Follow a regular exercise program to stay fit. This will help you maintain your balance. Ask your health care provider what types of exercise are appropriate for you. If you need a cane or walker, use it as recommended by your health care provider. Wear supportive shoes that have nonskid soles. Safety  Remove any tripping hazards, such as rugs, cords, and clutter. Install safety equipment such as grab bars in bathrooms and safety rails on stairs. Keep rooms and walkways  well-lit. General instructions Talk with your health care provider about your risks for falling. Tell your health care provider if: You fall. Be sure to tell your health care provider about all falls, even ones that seem minor. You feel dizzy, tiredness (fatigue), or off-balance. Take over-the-counter and prescription medicines only as told by your health care provider. These include  supplements. Eat a healthy diet and maintain a healthy weight. A healthy diet includes low-fat dairy products, low-fat (lean) meats, and fiber from whole grains, beans, and lots of fruits and vegetables. Stay current with your vaccines. Schedule regular health, dental, and eye exams. Summary Having a healthy lifestyle and getting preventive care can help to protect your health and wellness after age 11. Screening and testing are the best way to find a health problem early and help you avoid having a fall. Early diagnosis and treatment give you the best chance for managing medical conditions that are more common for people who are older than age 28. Falls are a major cause of broken bones and head injuries in people who are older than age 48. Take precautions to prevent a fall at home. Work with your health care provider to learn what changes you can make to improve your health and wellness and to prevent falls. This information is not intended to replace advice given to you by your health care provider. Make sure you discuss any questions you have with your health care provider. Document Revised: 01/28/2021 Document Reviewed: 01/28/2021 Elsevier Patient Education  2024 [employer].

## 2024-03-01 NOTE — Progress Notes (Signed)
 Subjective:    Patient ID: Sabrina [name], female    DOB: 28-Jul-1946, 78 y.o.   MRN: [id number]  Chief Complaint  Patient presents with   Medical Management of Chronic Issues    Callus  on the bottom of left foot wants to go back to the foot doctor   Pt presents to the office today for CPE and chronic follow up.   Pt is followed by Cardiologists every 4 months for A Fib and CHF. Has a pacemaker.   Has atherosclerosis and takes Lipitor daily.    She has osteoporosis and takes Fosamax  weekly. Takes Vit D and Calcium  daily. Last Dexa scan 12/25/21.   She has peripheral neuropathy and takes gabapentin  100 mg BID.   Complaining of callus on ball of her left foot. Requesting referral to podiatry.   Congestive Heart Failure Presents for follow-up visit. Associated symptoms include fatigue. Pertinent negatives include no edema or shortness of breath. The symptoms have been stable.  Asthma There is no cough, shortness of breath or wheezing. This is a chronic problem. The current episode started more than 1 year ago. The problem occurs intermittently. Associated symptoms include heartburn and malaise/fatigue. She reports moderate improvement on treatment. Her past medical history is significant for asthma.  Gastroesophageal Reflux She complains of belching and heartburn. She reports no coughing or no wheezing. This is a chronic problem. The current episode started more than 1 year ago. The problem occurs occasionally. Associated symptoms include fatigue. She has tried a PPI for the symptoms. The treatment provided moderate relief.  Anemia Presents for follow-up visit. Symptoms include malaise/fatigue. There has been no bruising/bleeding easily.  Insomnia Primary symptoms: difficulty falling asleep, frequent awakening, malaise/fatigue.   The current episode started more than one year. The onset quality is gradual. The problem occurs intermittently. Past treatments include medication. The  treatment provided moderate relief. PMH includes: depression.   Hyperlipidemia This is a chronic problem. The current episode started more than 1 year ago. The problem is uncontrolled. Recent lipid tests were reviewed and are high. Pertinent negatives include no shortness of breath. Current antihyperlipidemic treatment includes statins. The current treatment provides moderate improvement of lipids. Risk factors for coronary artery disease include dyslipidemia, hypertension and a sedentary lifestyle.  Anxiety Presents for follow-up visit. Symptoms include excessive worry, insomnia and nervous/anxious behavior. Patient reports no shortness of breath. Symptoms occur occasionally. The severity of symptoms is mild.   Her past medical history is significant for asthma.  Depression        This is a chronic problem.  The current episode started more than 1 year ago.   The onset quality is gradual.   The problem occurs intermittently.  Associated symptoms include fatigue, helplessness, hopelessness, insomnia and sad.  Past treatments include SNRIs - Serotonin and norepinephrine reuptake inhibitors.  Past medical history includes anxiety.       Review of Systems  Constitutional:  Positive for fatigue and malaise/fatigue.  Respiratory:  Negative for cough, shortness of breath and wheezing.   Gastrointestinal:  Positive for heartburn.  Hematological:  Does not bruise/bleed easily.  Psychiatric/Behavioral:  Positive for depression. The patient is nervous/anxious and has insomnia.   All other systems reviewed and are negative.  Family History  Problem Relation Age of Onset   Cancer Mother    Alzheimer's disease Mother    Heart disease Mother    Arthritis Father    Asthma Daughter    Arthritis Daughter    Obesity Daughter  Colon cancer Paternal Uncle 74   Arthritis Son    Hyperlipidemia Son    Obesity Son    Arthritis Son    Obesity Son    BRCA 1/2 Neg Hx    Social History   Socioeconomic  History   Marital status: Divorced    Spouse name: Not on file   Number of children: 3   Years of education: 2 years of college   Highest education level: Some college, no degree  Occupational History   Occupation: Retired  Tobacco Use   Smoking status: Former    Current packs/day: 0.00    Average packs/day: 0.5 packs/day for 25.0 years (12.5 ttl pk-yrs)    Types: Cigarettes    Start date: 02/27/1968    Quit date: 02/26/1993    Years since quitting: 31.0   Smokeless tobacco: Never  Vaping Use   Vaping status: Never Used  Substance and Sexual Activity   Alcohol use: Not Currently    Comment: 08/27/2017 "nothing since early 2000s"   Drug use: Not Currently   Sexual activity: Not Currently    Birth control/protection: Surgical    Comment: Hyst, Menarche-[age], First IC @ [age], Partners >5, DES-neg  Other Topics Concern   Not on file  Social History Narrative   ** Merged History Encounter **       Pt is right handed Lives in single story home with her grandson Has 3 adult children Associated degree  Retired Lawyer   Social Drivers of Corporate investment banker Strain: Low Risk  (03/04/2023)   Overall Financial Resource Strain (CARDIA)    Difficulty of Paying Living Expenses: Not hard at all  Food Insecurity: No Food Insecurity (03/04/2023)   Hunger Vital Sign    Worried About Running Out of Food in the Last Year: Never true    Ran Out of Food in the Last Year: Never true  Transportation Needs: No Transportation Needs (03/04/2023)   PRAPARE - Administrator, Civil Service (Medical): No    Lack of Transportation (Non-Medical): No  Physical Activity: Insufficiently Active (03/04/2023)   Exercise Vital Sign    Days of Exercise per Week: 3 days    Minutes of Exercise per Session: 30 min  Stress: No Stress Concern Present (03/04/2023)   [employer] of Occupational Health - Occupational Stress Questionnaire    Feeling of Stress : Not at all  Social Connections:  Moderately Integrated (03/04/2023)   Social Connection and Isolation Panel [NHANES]    Frequency of Communication with Friends and Family: More than three times a week    Frequency of Social Gatherings with Friends and Family: More than three times a week    Attends Religious Services: More than 4 times per year    Active Member of [employer] or Organizations: Yes    Attends Engineer, structural: More than 4 times per year    Marital Status: Divorced       Objective:   Physical Exam Vitals reviewed.  Constitutional:      General: She is not in acute distress.    Appearance: She is well-developed.  HENT:     Head: Normocephalic and atraumatic.     Right Ear: Tympanic membrane normal.     Left Ear: Tympanic membrane normal.  Eyes:     Pupils: Pupils are equal, round, and reactive to light.  Neck:     Thyroid : No thyromegaly.  Cardiovascular:     Rate and Rhythm: Normal  rate and regular rhythm.     Heart sounds: Normal heart sounds. No murmur heard. Pulmonary:     Effort: Pulmonary effort is normal. No respiratory distress.     Breath sounds: Normal breath sounds. No wheezing.  Abdominal:     General: Bowel sounds are normal. There is no distension.     Palpations: Abdomen is soft.     Tenderness: There is no abdominal tenderness.  Musculoskeletal:        General: No tenderness. Normal range of motion.     Cervical back: Normal range of motion and neck supple.  Skin:    General: Skin is warm and dry.     Comments: Callus of ball on right foot  Neurological:     Mental Status: She is alert and oriented to person, place, and time.     Cranial Nerves: No cranial nerve deficit.     Deep Tendon Reflexes: Reflexes are normal and symmetric.  Psychiatric:        Behavior: Behavior normal.        Thought Content: Thought content normal.        Judgment: Judgment normal.      BP 122/82   Pulse 85   Temp (!) 97.5 F (36.4 C) (Temporal)   Ht 5\' 4"  (1.626 m)   Wt 135 lb  (61.2 kg)   SpO2 98%   BMI 23.17 kg/m       Assessment & Plan:   LUE SYKORA comes in today with chief complaint of Medical Management of Chronic Issues (Callus  on the bottom of left foot wants to go back to the foot doctor)   Diagnosis and orders addressed:  1. Annual physical exam (Primary) - CMP14+EGFR - CBC with Differential/Platelet - Lipid panel  2. Benign essential HTN - CMP14+EGFR - CBC with Differential/Platelet  3. Chronic diastolic heart failure (HCC) - CMP14+EGFR - CBC with Differential/Platelet  4. Vitamin D  deficiency - CMP14+EGFR - CBC with Differential/Platelet - VITAMIN D  25 Hydroxy (Vit-D Deficiency, Fractures)  5. Mild intermittent asthma without complication - CMP14+EGFR - CBC with Differential/Platelet  6. Generalized anxiety disorder - CMP14+EGFR - CBC with Differential/Platelet  7. Moderate episode of recurrent major depressive disorder (HCC) - CMP14+EGFR - CBC with Differential/Platelet  8. Insomnia, unspecified type - CMP14+EGFR - CBC with Differential/Platelet  9. Iron deficiency anemia, unspecified iron deficiency anemia type  - CMP14+EGFR - CBC with Differential/Platelet - Iron, TIBC and Ferritin Panel  10. Atrial fibrillation with RVR (HCC) - CMP14+EGFR - CBC with Differential/Platelet  11. Gastroesophageal reflux disease, unspecified whether esophagitis present - CMP14+EGFR - CBC with Differential/Platelet  12. Mixed hyperlipidemia - CMP14+EGFR - CBC with Differential/Platelet - Lipid panel  13. Atherosclerosis of native coronary artery of native heart without angina pectoris - CMP14+EGFR - CBC with Differential/Platelet  14. Post-menopausal - DG [facility] DEXA  15. Osteoporosis, unspecified osteoporosis type, unspecified pathological fracture presence - DG [facility] DEXA  16. Callus of foot - Ambulatory referral to Podiatry    Labs pending Continue current medications  Keep follow up with Cardiologists   Health Maintenance reviewed Diet and exercise encouraged  Follow up plan: 4 month   Tommas Fragmin, FNP

## 2024-03-02 DIAGNOSIS — M810 Age-related osteoporosis without current pathological fracture: Secondary | ICD-10-CM | POA: Diagnosis not present

## 2024-03-02 DIAGNOSIS — Z780 Asymptomatic menopausal state: Secondary | ICD-10-CM | POA: Diagnosis not present

## 2024-03-02 LAB — CMP14+EGFR
ALT: 17 IU/L (ref 0–32)
AST: 22 IU/L (ref 0–40)
Albumin: 3.8 g/dL (ref 3.8–4.8)
Alkaline Phosphatase: 86 IU/L (ref 44–121)
BUN/Creatinine Ratio: 21 (ref 12–28)
BUN: 18 mg/dL (ref 8–27)
Bilirubin Total: 0.7 mg/dL (ref 0.0–1.2)
CO2: 25 mmol/L (ref 20–29)
Calcium: 9.3 mg/dL (ref 8.7–10.3)
Chloride: 103 mmol/L (ref 96–106)
Creatinine, Ser: 0.84 mg/dL (ref 0.57–1.00)
Globulin, Total: 2.2 g/dL (ref 1.5–4.5)
Glucose: 92 mg/dL (ref 70–99)
Potassium: 5.1 mmol/L (ref 3.5–5.2)
Sodium: 140 mmol/L (ref 134–144)
Total Protein: 6 g/dL (ref 6.0–8.5)
eGFR: 72 mL/min/{1.73_m2} (ref >59)

## 2024-03-02 LAB — CBC WITH DIFFERENTIAL/PLATELET
Basophils Absolute: 0.1 10*3/uL (ref 0.0–0.2)
Basos: 1 %
EOS (ABSOLUTE): 0.3 10*3/uL (ref 0.0–0.4)
Eos: 4 %
Hematocrit: 39.4 % (ref 34.0–46.6)
Hemoglobin: 12.2 g/dL (ref 11.1–15.9)
Immature Grans (Abs): 0 10*3/uL (ref 0.0–0.1)
Immature Granulocytes: 0 %
Lymphocytes Absolute: 1.6 10*3/uL (ref 0.7–3.1)
Lymphs: 23 %
MCH: 27.4 pg (ref 26.6–33.0)
MCHC: 31 g/dL — ABNORMAL LOW (ref 31.5–35.7)
MCV: 88 fL (ref 79–97)
Monocytes Absolute: 0.4 10*3/uL (ref 0.1–0.9)
Monocytes: 6 %
Neutrophils Absolute: 4.5 10*3/uL (ref 1.4–7.0)
Neutrophils: 66 %
Platelets: 304 10*3/uL (ref 150–450)
RBC: 4.46 x10E6/uL (ref 3.77–5.28)
RDW: 14.5 % (ref 11.7–15.4)
WBC: 6.9 10*3/uL (ref 3.4–10.8)

## 2024-03-02 LAB — LIPID PANEL
Cholesterol, Total: 121 mg/dL (ref 100–199)
HDL: 65 mg/dL (ref >39)
LDL CALC COMMENT:: 1.9 ratio (ref 0.0–4.4)
LDL Chol Calc (NIH): 42 mg/dL (ref 0–99)
Triglycerides: 63 mg/dL (ref 0–149)
VLDL Cholesterol Cal: 14 mg/dL (ref 5–40)

## 2024-03-02 LAB — IRON,TIBC AND FERRITIN PANEL
Ferritin: 10 ng/mL — ABNORMAL LOW (ref 15–150)
Iron Saturation: 11 % — ABNORMAL LOW (ref 15–55)
Iron: 39 ug/dL (ref 27–139)
Total Iron Binding Capacity: 357 ug/dL (ref 250–450)
UIBC: 318 ug/dL (ref 118–369)

## 2024-03-02 LAB — VITAMIN D 25 HYDROXY (VIT D DEFICIENCY, FRACTURES): Vit D, 25-Hydroxy: 43.7 ng/mL (ref 30.0–100.0)

## 2024-03-03 ENCOUNTER — Other Ambulatory Visit: Payer: Self-pay | Admitting: Family

## 2024-03-03 ENCOUNTER — Telehealth: Payer: Self-pay

## 2024-03-03 ENCOUNTER — Ambulatory Visit: Payer: Self-pay | Admitting: Family

## 2024-03-03 DIAGNOSIS — J4520 Mild intermittent asthma, uncomplicated: Secondary | ICD-10-CM

## 2024-03-03 DIAGNOSIS — M810 Age-related osteoporosis without current pathological fracture: Secondary | ICD-10-CM

## 2024-03-03 MED ORDER — ALENDRONATE SODIUM 70 MG PO TABS: 70.0000 mg | ORAL_TABLET | ORAL | 11 refills | Status: DC

## 2024-03-03 NOTE — Progress Notes (Signed)
 Care Guide Pharmacy Note  03/03/2024 Name: Sabrina [name] MRN: [id number] DOB: Oct 12, 1945  Referred By: Yevette Hem, FNP Reason for referral: Complex Care Management (Outreach to schedule with Pharm d )   Sabrina [name] is a 78 y.o. year old female who is a primary care patient of Yevette Hem, FNP.  Spencer Dy was referred to the pharmacist for assistance related to: osteoporosis  An unsuccessful telephone outreach was attempted today to contact the patient who was referred to the pharmacy team for assistance with medication management. Additional attempts will be made to contact the patient. Lenton Rail , RMA     [facility]  [facility], [facility] Guide  Direct Dial: [phone]  Website: [email or web address]

## 2024-03-03 NOTE — Progress Notes (Signed)
 Care Guide Pharmacy Note  03/03/2024 Name: Sabrina [name] MRN: [id number] DOB: Aug 30, 1946  Referred By: Yevette Hem, FNP Reason for referral: Complex Care Management (Outreach to schedule with Pharm d )   Sabrina [name] is a 78 y.o. year old female who is a primary care patient of Yevette Hem, FNP.  Sabrina [name] was referred to the pharmacist for assistance related to: 04/01/2024  Successful contact was made with the patient to discuss pharmacy services including being ready for the pharmacist to call at least 5 minutes before the scheduled appointment time and to have medication bottles and any blood pressure readings ready for review. The patient agreed to meet with the pharmacist via telephone visit on (date/time).04/01/2024  Lenton Rail , RMA     [place]  [facility], [facility] Guide  Direct Dial: [phone]  Website: [email or web address]

## 2024-03-07 ENCOUNTER — Telehealth: Payer: Self-pay | Admitting: Obstetrics and Gynecology

## 2024-03-07 DIAGNOSIS — E2839 Other primary ovarian failure: Secondary | ICD-10-CM

## 2024-03-07 NOTE — Telephone Encounter (Signed)
 Dxa scan

## 2024-03-14 ENCOUNTER — Telehealth (HOSPITAL_BASED_OUTPATIENT_CLINIC_OR_DEPARTMENT_OTHER): Payer: Self-pay

## 2024-03-28 ENCOUNTER — Ambulatory Visit (INDEPENDENT_AMBULATORY_CARE_PROVIDER_SITE_OTHER): Payer: Medicare HMO

## 2024-03-28 DIAGNOSIS — I442 Atrioventricular block, complete: Secondary | ICD-10-CM | POA: Diagnosis not present

## 2024-03-29 LAB — CUP PACEART REMOTE DEVICE CHECK
Battery Remaining Longevity: 28 mo
Battery Voltage: 2.84 V
Brady Statistic AP VP Percent: 0.32 %
Brady Statistic AP VS Percent: 99.64 %
Brady Statistic AS VP Percent: 0 %
Brady Statistic AS VS Percent: 0.04 %
Brady Statistic RA Percent Paced: 100 %
Brady Statistic RV Percent Paced: 0.32 %
Date Time Interrogation Session: 20250708022025
Implantable Lead Connection Status: 753985
Implantable Lead Connection Status: 753985
Implantable Lead Implant Date: 20181206
Implantable Lead Implant Date: 20181206
Implantable Lead Location: 753860
Implantable Lead Location: 753860
Implantable Lead Model: 3830
Implantable Lead Model: 5076
Implantable Pulse Generator Implant Date: 20181206
Lead Channel Impedance Value: 266 Ohm
Lead Channel Impedance Value: 304 Ohm
Lead Channel Impedance Value: 380 Ohm
Lead Channel Impedance Value: 418 Ohm
Lead Channel Sensing Intrinsic Amplitude: 17.875 mV
Lead Channel Sensing Intrinsic Amplitude: 17.875 mV
Lead Channel Sensing Intrinsic Amplitude: 3.625 mV
Lead Channel Sensing Intrinsic Amplitude: 4.75 mV
Lead Channel Setting Pacing Amplitude: 2 V
Lead Channel Setting Pacing Amplitude: 2.5 V
Lead Channel Setting Pacing Pulse Width: 0.3 ms
Lead Channel Setting Sensing Sensitivity: 2 mV
Zone Setting Status: 755011
Zone Setting Status: 755011

## 2024-03-30 ENCOUNTER — Telehealth: Payer: Self-pay

## 2024-03-30 NOTE — Telephone Encounter (Signed)
 Scheduled remote:   7 VHR episodes - appear consistent with short runs of NSVT, cannot rule out short SVT vs AVNRT on some events -  longest 28 beats.  Episodes April - June 11th. HG's show overall appropriate rate control.    Patient is doing well.  She had a sinus infection and was sick (on OTC sinus meds) in April/early May which may have contributed.  She is taking all of her medications and is doing great since - no further issues, will see Dr. Waddell in October per routine follow up.

## 2024-03-31 ENCOUNTER — Ambulatory Visit: Payer: Self-pay | Admitting: Internal Medicine

## 2024-03-31 ENCOUNTER — Ambulatory Visit (INDEPENDENT_AMBULATORY_CARE_PROVIDER_SITE_OTHER)

## 2024-03-31 DIAGNOSIS — E538 Deficiency of other specified B group vitamins: Secondary | ICD-10-CM | POA: Diagnosis not present

## 2024-03-31 NOTE — Progress Notes (Signed)
 Patient is in office today for a nurse visit for B12 Injection. Patient Injection was given in the  Left deltoid. Patient tolerated injection well.

## 2024-04-01 ENCOUNTER — Other Ambulatory Visit (INDEPENDENT_AMBULATORY_CARE_PROVIDER_SITE_OTHER)

## 2024-04-01 DIAGNOSIS — M818 Other osteoporosis without current pathological fracture: Secondary | ICD-10-CM

## 2024-04-01 NOTE — Progress Notes (Unsigned)
 04/01/2024 Name: Sabrina [name] MRN: [id number] DOB: [birth year]  Chief Complaint  Patient presents with   Osteoporosis    Sabrina [name] is a 78 y.o. year old female who presented for a telephone visit.  I connected with  Sabrina [name] on 04/01/24 by telephone and verified that I am speaking with the correct person using two identifiers. I discussed the limitations of evaluation and management by telemedicine. The patient expressed understanding and agreed to proceed.  Patient was located in her home and PharmD in PCP office during this visit.   They were referred to the pharmacist by their PCP for assistance in managing osteoporosis .   Subjective:  Care Team: Primary Care Provider: Lavell Bari LABOR, FNP ; Next Scheduled Visit: 05/03/24 {careteamprovider:[id number]}  Medication Access/Adherence  Current Pharmacy:  [id number] - [place], [place] - [address] [place] [address] Phone: [phone] Fax: [phone]  [facility] - [place], [place] - [address] [place] [address] Phone: [phone] Fax: [phone]   Patient reports affordability concerns with their medications: No  Patient reports access/transportation concerns to their pharmacy: No  Patient reports adherence concerns with their medications:  No    Osteoporosis:  Current medications:  just restarted alendronate  Medications tried in the past: alendronate  caused GI side effects, but patient was laying down after taking it  Current supplements: Calcium  1000mg , and Vitamin  D3 2000-5000 IU   Current physical activity: chair yoga for osteoporosis at the rec center in [place]; goes to gym daily   CLINICAL DATA:  78 year old Female Postmenopausal. Screening for osteoporosis   History of fragility fracture.--broken nose, ankle, hip   Exclusions: Left hip due to replacement   COMPARISON:  12/25/2021    FINDINGS: Scan quality: Good.   LUMBAR SPINE (L1-L4):  BMD (in g/cm2): 0.945  T-score: -2.0  Z-score: -0.1   Rate of change from previous exam: No significant rate of change from previous exam.  RIGHT FEMORAL NECK:   BMD (in g/cm2): 0.771  T-score: -1.9  Z-score: 0.1   RIGHT TOTAL HIP:  BMD (in g/cm2): 0.687  T-score: -2.5  Z-score: -0.6   Rate of change from previous exam: 11.5%   LEFT FOREARM (RADIUS 33%):  BMD (in g/cm2): 0.559  T-score: -3.7  Z-score: -1.3   Rate of change from previous exam: No significant rate of change from previous exam.   FRAX 10-YEAR PROBABILITY OF FRACTURE:   FRAX not reported as the lowest BMD is not in the osteopenia range.   IMPRESSION: Osteoporosis based on BMD.   Fracture risk is increased. Increased risk is based on low BMD and history of fragility fracture.   Objective:  Lab Results  Component Value Date   HGBA1C 5.5 03/05/2023    Lab Results  Component Value Date   CREATININE 0.84 03/01/2024   BUN 18 03/01/2024   NA 140 03/01/2024   K 5.1 03/01/2024   CL 103 03/01/2024   CO2 25 03/01/2024    Lab Results  Component Value Date   CHOL 121 03/01/2024   HDL 65 03/01/2024   LDLCALC 42 03/01/2024   TRIG 63 03/01/2024   CHOLHDL 1.9 03/01/2024    Medications Reviewed Today     Reviewed by Billee Mliss BIRCH, [facility] (Pharmacist) on 04/01/24 at 0906  Med List Status: <None>   Medication Order Taking? Sig Documenting Provider Last Dose Status Informant  albuterol  (VENTOLIN  HFA) 108 (90  Base) MCG/ACT inhaler [id number] No INHALE 2 PUFFS BY MOUTH EVERY 6 HOURS AS NEEDED FOR WHEEZE OR SHORTNESS OF BREATH Hawks, Christy A, FNP Taking Active   alendronate  (FOSAMAX ) 70 MG tablet [id number]  Take 1 tablet (70 mg total) by mouth every 7 (seven) days. Take with a full glass of water  on an empty stomach. Lavell Lye A, FNP  Active   aspirin  EC 81 MG EC tablet [id number] No Take 1 tablet (81 mg total) by mouth daily. Swallow whole.  Arvil Poe D, NP Taking Active Self  atorvastatin  (LIPITOR) 80 MG tablet [id number] No Take 1 tablet (80 mg total) by mouth daily. Alvan Dorn FALCON, MD Taking Active   blood glucose meter kit and supplies [id number] No Dispense based on patient and insurance preference. Use up to four times daily as directed. (FOR ICD-10 E10.9, E11.9). Cherylene Homer HERO, NP Taking Active   Blood Glucose Monitoring Suppl (TRUE METRIX AIR GLUCOSE METER) w/Device KIT [id number] No Test BS daily Dx R73.09 Ijaola, Onyeje M, NP Taking Active   cromolyn (NASALCROM) 5.2 MG/ACT nasal spray [id number] No Place 1 spray into both nostrils 2 (two) times daily as needed for allergies. [provider] Taking Active Self  cyanocobalamin  (VITAMIN B12) injection 1,000 mcg [id number]   Lavell Lye A, FNP  Active   DULoxetine  (CYMBALTA ) 60 MG capsule [id number] No Take 1 capsule (60 mg total) by mouth daily. Lavell Lye LABOR, FNP Taking Active   estradiol  (ESTRACE ) 0.1 MG/GM vaginal cream [id number] No Place 1 Applicatorful vaginally at bedtime. Gerldine Lauraine BROCKS, FNP Taking Active   ferrous sulfate  325 (65 FE) MG tablet [id number] No Take 325 mg by mouth once a week. [provider] Taking Active Self           Med Note [name] Pablo Jul 29, 2021 11:55 AM)    fluticasone  (FLONASE ) 50 MCG/ACT nasal spray [id number] No SPRAY 2 SPRAYS INTO EACH NOSTRIL EVERY DAY Hawks, Christy A, FNP Taking Active   furosemide  (LASIX ) 20 MG tablet [id number] No Take 1 tablet (20 mg total) by mouth daily. Lavell Lye LABOR, FNP Taking Active   gabapentin  (NEURONTIN ) 100 MG capsule [id number] No 100 mg in AM and 300 mg in evening [place], [place] A, FNP Taking Active   glucose blood (TRUE METRIX BLOOD GLUCOSE TEST) test strip [id number] No Test BS daily Dx R73.09 Ijaola, Onyeje M, NP Taking Active   methenamine  (HIPREX ) 1 g tablet [id number] No Take 1 tablet (1 g total) by mouth 2 (two) times daily with a meal. Most effective when taken  with a daily Vitamin C supplement. Gerldine Lauraine BROCKS, FNP Taking Active   Multiple Vitamin (MULTIVITAMIN WITH MINERALS) TABS tablet [id number] No Take 1 tablet by mouth in the morning. [provider] Taking Active Self  nitroGLYCERIN  (NITROSTAT ) 0.4 MG SL tablet [id number] No Place 1 tablet (0.4 mg total) under the tongue every 5 (five) minutes x 3 doses as needed for chest pain. [place], [place], PA Taking Active   Omega-3 Fatty Acids (FISH OIL PO) [id number] No Take 1 capsule by mouth daily. [provider] Taking Active Self           Med Note KERRIN, MELISSA R   Mon Jul 29, 2021 11:56 AM)    pantoprazole  (PROTONIX ) 40 MG tablet [id number] No Take 1 tablet (40 mg total) by mouth 2 (two) times daily. Lavell Lye LABOR, FNP Taking Active   potassium chloride  SA (KLOR-CON  M) 20 MEQ tablet [id number]  No Take 1 tablet (20 mEq total) by mouth 2 (two) times daily. Take 1 tablet twice a day [place], [place], [place] Taking Active   SYMBICORT  80-4.5 MCG/ACT inhaler [id number]  INHALE TWO PUFFS INTO THE LUNGS IN THE MORNING AND 2 PUFFS AT BEDTIME Lavell Lye A, FNP  Active   triamcinolone  cream (KENALOG ) 0.1 % [id number] No Apply 1 Application topically 2 (two) times daily. Lavell Lye LABOR, FNP Taking Active   TRUEplus Lancets 33G MISC [id number] No Test BS daily Dx R73.09 Cherylene Homer HERO, NP Taking Active   vitamin C (ASCORBIC ACID) 500 MG tablet [id number] No Take 500 mg by mouth daily. [provider] Taking Active Self  Vitamin D , Ergocalciferol , (DRISDOL ) 1.25 MG (50000 UNIT) CAPS capsule [id number] No Take 1 capsule (50,000 Units total) by mouth every 7 (seven) days. Lavell Lye LABOR, FNP Taking Active              Assessment/Plan:   Osteoporosis: - START alendronate  70mg  weekly - Reviewed recommendation for daily calcium  intake of 1200 mg and vitamin D  intake of [id number] units - Recommended to choose calcium  citrate formulation due to concurrent acid reflux  medication - Reviewed benefits of weight bearing exercise--patient remains very active at the gym and at home   Follow Up Plan: PCP; repeat DEXA in 2 years  Mliss Tarry Griffin, PharmD, BCACP, CPP Clinical Pharmacist, [facility]

## 2024-04-16 ENCOUNTER — Emergency Department (HOSPITAL_COMMUNITY)

## 2024-04-16 ENCOUNTER — Encounter (HOSPITAL_COMMUNITY): Payer: Self-pay

## 2024-04-16 ENCOUNTER — Emergency Department (HOSPITAL_COMMUNITY)
Admission: EM | Admit: 2024-04-16 | Discharge: 2024-04-16 | Disposition: A | Discharge status: Home / Self Care | Attending: Emergency Medicine | Admitting: Emergency Medicine

## 2024-04-16 ENCOUNTER — Other Ambulatory Visit: Payer: Self-pay

## 2024-04-16 DIAGNOSIS — Z7951 Long term (current) use of inhaled steroids: Secondary | ICD-10-CM | POA: Insufficient documentation

## 2024-04-16 DIAGNOSIS — M85872 Other specified disorders of bone density and structure, left ankle and foot: Secondary | ICD-10-CM | POA: Diagnosis not present

## 2024-04-16 DIAGNOSIS — I509 Heart failure, unspecified: Secondary | ICD-10-CM | POA: Diagnosis not present

## 2024-04-16 DIAGNOSIS — Z79899 Other long term (current) drug therapy: Secondary | ICD-10-CM | POA: Insufficient documentation

## 2024-04-16 DIAGNOSIS — I6523 Occlusion and stenosis of bilateral carotid arteries: Secondary | ICD-10-CM | POA: Diagnosis not present

## 2024-04-16 DIAGNOSIS — S022XXD Fracture of nasal bones, subsequent encounter for fracture with routine healing: Secondary | ICD-10-CM | POA: Diagnosis not present

## 2024-04-16 DIAGNOSIS — W19XXXA Unspecified fall, initial encounter: Secondary | ICD-10-CM

## 2024-04-16 DIAGNOSIS — J4489 Other specified chronic obstructive pulmonary disease: Secondary | ICD-10-CM | POA: Diagnosis not present

## 2024-04-16 DIAGNOSIS — M25572 Pain in left ankle and joints of left foot: Secondary | ICD-10-CM | POA: Diagnosis not present

## 2024-04-16 DIAGNOSIS — Z7982 Long term (current) use of aspirin: Secondary | ICD-10-CM | POA: Diagnosis not present

## 2024-04-16 DIAGNOSIS — W1839XA Other fall on same level, initial encounter: Secondary | ICD-10-CM | POA: Insufficient documentation

## 2024-04-16 DIAGNOSIS — I110 Hypertensive heart disease with heart failure: Secondary | ICD-10-CM | POA: Diagnosis not present

## 2024-04-16 DIAGNOSIS — S0990XA Unspecified injury of head, initial encounter: Secondary | ICD-10-CM | POA: Diagnosis not present

## 2024-04-16 DIAGNOSIS — G319 Degenerative disease of nervous system, unspecified: Secondary | ICD-10-CM | POA: Diagnosis not present

## 2024-04-16 DIAGNOSIS — S99912A Unspecified injury of left ankle, initial encounter: Secondary | ICD-10-CM | POA: Diagnosis not present

## 2024-04-16 NOTE — ED Triage Notes (Signed)
 Pt stated that she stepped in a hole earlier this morning and lost her balance and fell forward on her face. Pt has small cuts from her glasses on her face and stated that she did also hit her head. No LOC or changes in alertness. Pt is not on blood thinners and has no other complaints. Pt took tylenol  and has been using ice packs

## 2024-04-16 NOTE — ED Provider Notes (Signed)
 [place] EMERGENCY DEPARTMENT AT [facility] Provider Note   CSN: [id number] Arrival date & time: 04/16/24  1227     Patient presents with: Sabrina [name] Sabrina [name] is a 78 y.o. female.   78 year old female presents today for concern of a fall.  She states she was walking in her backyard when she stepped in a hole causing her to fall forward.  Denies loss of consciousness.  Is sure that this was a mechanical fall.  Not on any blood thinning medicine.  Has some pain to her right maxillary bone, and over the right temple.  No pain with EOMs.  Endorses left ankle pain.  Denies any other injury.  The history is provided by the patient. No language interpreter was used.       Prior to Admission medications   Medication Sig Start Date End Date Taking? Authorizing Provider  albuterol  (VENTOLIN  HFA) 108 (90 Base) MCG/ACT inhaler INHALE 2 PUFFS BY MOUTH EVERY 6 HOURS AS NEEDED FOR WHEEZE OR SHORTNESS OF BREATH 03/01/24   Hawks, Bari A, FNP  alendronate  (FOSAMAX ) 70 MG tablet Take 1 tablet (70 mg total) by mouth every 7 (seven) days. Take with a full glass of water  on an empty stomach. 03/03/24 03/03/25  Lavell Bari A, FNP  aspirin  EC 81 MG EC tablet Take 1 tablet (81 mg total) by mouth daily. Swallow whole. 08/02/21   McDaniel, Jill D, NP  atorvastatin  (LIPITOR) 80 MG tablet Take 1 tablet (80 mg total) by mouth daily. 11/17/23   Alvan Dorn FALCON, MD  blood glucose meter kit and supplies Dispense based on patient and insurance preference. Use up to four times daily as directed. (FOR ICD-10 E10.9, E11.9). 02/11/22   Ijaola, Onyeje M, NP  Blood Glucose Monitoring Suppl (TRUE METRIX AIR GLUCOSE METER) w/Device KIT Test BS daily Dx R73.09 02/12/22   Ijaola, Onyeje M, NP  cromolyn (NASALCROM) 5.2 MG/ACT nasal spray Place 1 spray into both nostrils 2 (two) times daily as needed for allergies.    [provider]  DULoxetine  (CYMBALTA ) 60 MG capsule Take 1 capsule (60 mg total) by  mouth daily. 11/24/23   Lavell Bari A, FNP  estradiol  (ESTRACE ) 0.1 MG/GM vaginal cream Place 1 Applicatorful vaginally at bedtime. 09/21/23   Gerldine Lauraine JAYSON, FNP  ferrous sulfate  325 (65 FE) MG tablet Take 325 mg by mouth once a week.    [provider]  fluticasone  (FLONASE ) 50 MCG/ACT nasal spray SPRAY 2 SPRAYS INTO EACH NOSTRIL EVERY DAY 06/19/23   Lavell Bari A, FNP  furosemide  (LASIX ) 20 MG tablet Take 1 tablet (20 mg total) by mouth daily. 11/24/23   Lavell Bari LABOR, FNP  gabapentin  (NEURONTIN ) 100 MG capsule 100 mg in AM and 300 mg in evening 11/24/23   Lavell Bari A, FNP  glucose blood (TRUE METRIX BLOOD GLUCOSE TEST) test strip Test BS daily Dx R73.09 02/12/22   Ijaola, Onyeje M, NP  methenamine  (HIPREX ) 1 g tablet Take 1 tablet (1 g total) by mouth 2 (two) times daily with a meal. Most effective when taken with a daily Vitamin C supplement. 09/21/23   Gerldine Lauraine JAYSON, FNP  Multiple Vitamin (MULTIVITAMIN WITH MINERALS) TABS tablet Take 1 tablet by mouth in the morning.    [provider]  nitroGLYCERIN  (NITROSTAT ) 0.4 MG SL tablet Place 1 tablet (0.4 mg total) under the tongue every 5 (five) minutes x 3 doses as needed for chest pain. 02/07/22   Jerrie Anger, PA  Omega-3 Fatty Acids (FISH OIL PO) Take 1 capsule by mouth daily.    [provider]  pantoprazole  (PROTONIX ) 40 MG tablet Take 1 tablet (40 mg total) by mouth 2 (two) times daily. 11/24/23   Lavell Bari LABOR, FNP  potassium chloride  SA (KLOR-CON  M) 20 MEQ tablet Take 1 tablet (20 mEq total) by mouth 2 (two) times daily. Take 1 tablet twice a day 02/26/22   Jerrie Anger, PA  SYMBICORT  80-4.5 MCG/ACT inhaler INHALE TWO PUFFS INTO THE LUNGS IN THE MORNING AND 2 PUFFS AT BEDTIME 03/03/24   Lavell Bari A, FNP  triamcinolone  cream (KENALOG ) 0.1 % Apply 1 Application topically 2 (two) times daily. 11/24/23   Lavell Bari LABOR, FNP  TRUEplus Lancets 33G MISC Test BS daily Dx R73.09 02/12/22   Ijaola,  Onyeje M, NP  vitamin C (ASCORBIC ACID) 500 MG tablet Take 500 mg by mouth daily.    [provider]  Vitamin D , Ergocalciferol , (DRISDOL ) 1.25 MG (50000 UNIT) CAPS capsule Take 1 capsule (50,000 Units total) by mouth every 7 (seven) days. 11/24/23   Lavell Bari LABOR, FNP    Allergies: Codeine    Review of Systems  Constitutional:  Negative for chills and fever.  Eyes:  Negative for visual disturbance.  Respiratory:  Negative for shortness of breath.   Cardiovascular:  Negative for chest pain.  Musculoskeletal:  Positive for arthralgias. Negative for back pain, neck pain and neck stiffness.  Neurological:  Negative for syncope, weakness, light-headedness and headaches.  All other systems reviewed and are negative.   Updated Vital Signs BP 107/73 (BP Location: Right Arm)   Pulse 76   Temp 97.6 F (36.4 C) (Oral)   Resp 18   Ht 5' 3 (1.6 m)   Wt 62.6 kg   SpO2 100%   BMI 24.45 kg/m   Physical Exam Vitals and nursing note reviewed.  Constitutional:      General: She is not in acute distress.    Appearance: Normal appearance. She is not ill-appearing.  HENT:     Head: Normocephalic and atraumatic.     Nose: Nose normal.  Eyes:     Conjunctiva/sclera: Conjunctivae normal.  Cardiovascular:     Rate and Rhythm: Normal rate and regular rhythm.  Pulmonary:     Effort: Pulmonary effort is normal. No respiratory distress.  Musculoskeletal:        General: No deformity. Normal range of motion.     Comments: Cervical, thoracic, lumbar spine without tenderness to palpation.  Good range of motion in bilateral upper and lower extremities with 5/5 strength.  There is tenderness over the left ankle otherwise all major joints are without tenderness to palpation.  Ambulates with an antalgic gait.  Skin:    Findings: No rash.  Neurological:     Mental Status: She is alert.     (all labs ordered are listed, but only abnormal results are displayed) Labs Reviewed - No data to  display  EKG: None  Radiology: CT Head Wo Contrast Result Date: 04/16/2024 CLINICAL DATA:  Polytrauma, blunt. Stepped in a hole and fell this morning with head/facial injury. EXAM: CT HEAD WITHOUT CONTRAST CT MAXILLOFACIAL WITHOUT CONTRAST CT CERVICAL SPINE WITHOUT CONTRAST TECHNIQUE: Multidetector CT imaging of the head, cervical spine, and maxillofacial structures were performed using the standard protocol without intravenous contrast. Multiplanar CT image reconstructions of the cervical spine and maxillofacial structures were also generated. RADIATION DOSE REDUCTION: This exam was performed according to the departmental dose-optimization program which  includes automated exposure control, adjustment of the mA and/or kV according to patient size and/or use of iterative reconstruction technique. COMPARISON:  CT maxillofacial 05/30/2023.  CT head 10/06/2018. FINDINGS: CT HEAD FINDINGS Brain: There is no evidence of an acute infarct, intracranial hemorrhage, mass, midline shift, or extra-axial fluid collection. There is mild generalized cerebral atrophy. Vascular: Calcified atherosclerosis at the skull base. No hyperdense vessel. Skull: No fracture or suspicious lesion. Other: None. CT MAXILLOFACIAL FINDINGS Osseous: No acute fracture, mandibular dislocation, or destructive process. Interval healing of the nasal bone fractures on the 2024 maxillofacial CT. Edentulous. Orbits: Bilateral cataract extraction. Sinuses: Moderate mucosal thickening in the ethmoid sinuses and mild scattered mucosal thickening elsewhere. No sizable fluid level. Clear mastoid air cells and middle ear cavities. Soft tissues: Unremarkable. CT CERVICAL SPINE FINDINGS Alignment: Cervical spine straightening.  No significant listhesis. Skull base and vertebrae: No acute fracture or suspicious lesion. Soft tissues and spinal canal: No prevertebral fluid or swelling. No visible canal hematoma. Disc levels: Widespread disc degeneration,  including severe disc space narrowing from C3-4 through C5-6. Moderate to severe multilevel neural foraminal stenosis. At least moderate spinal stenosis at C4-5 and C5-6. Upper chest: Clear lung apices. Other: Mild atherosclerotic calcification at the carotid bifurcations. IMPRESSION: 1. No evidence of acute intracranial abnormality. 2. No acute facial or cervical spine fracture. Electronically Signed   By: Dasie Hamburg M.D.   On: 04/16/2024 15:01   CT Maxillofacial Wo Contrast Result Date: 04/16/2024 CLINICAL DATA:  Polytrauma, blunt. Stepped in a hole and fell this morning with head/facial injury. EXAM: CT HEAD WITHOUT CONTRAST CT MAXILLOFACIAL WITHOUT CONTRAST CT CERVICAL SPINE WITHOUT CONTRAST TECHNIQUE: Multidetector CT imaging of the head, cervical spine, and maxillofacial structures were performed using the standard protocol without intravenous contrast. Multiplanar CT image reconstructions of the cervical spine and maxillofacial structures were also generated. RADIATION DOSE REDUCTION: This exam was performed according to the departmental dose-optimization program which includes automated exposure control, adjustment of the mA and/or kV according to patient size and/or use of iterative reconstruction technique. COMPARISON:  CT maxillofacial 05/30/2023.  CT head 10/06/2018. FINDINGS: CT HEAD FINDINGS Brain: There is no evidence of an acute infarct, intracranial hemorrhage, mass, midline shift, or extra-axial fluid collection. There is mild generalized cerebral atrophy. Vascular: Calcified atherosclerosis at the skull base. No hyperdense vessel. Skull: No fracture or suspicious lesion. Other: None. CT MAXILLOFACIAL FINDINGS Osseous: No acute fracture, mandibular dislocation, or destructive process. Interval healing of the nasal bone fractures on the 2024 maxillofacial CT. Edentulous. Orbits: Bilateral cataract extraction. Sinuses: Moderate mucosal thickening in the ethmoid sinuses and mild scattered mucosal  thickening elsewhere. No sizable fluid level. Clear mastoid air cells and middle ear cavities. Soft tissues: Unremarkable. CT CERVICAL SPINE FINDINGS Alignment: Cervical spine straightening.  No significant listhesis. Skull base and vertebrae: No acute fracture or suspicious lesion. Soft tissues and spinal canal: No prevertebral fluid or swelling. No visible canal hematoma. Disc levels: Widespread disc degeneration, including severe disc space narrowing from C3-4 through C5-6. Moderate to severe multilevel neural foraminal stenosis. At least moderate spinal stenosis at C4-5 and C5-6. Upper chest: Clear lung apices. Other: Mild atherosclerotic calcification at the carotid bifurcations. IMPRESSION: 1. No evidence of acute intracranial abnormality. 2. No acute facial or cervical spine fracture. Electronically Signed   By: Dasie Hamburg M.D.   On: 04/16/2024 15:01   CT Cervical Spine Wo Contrast Result Date: 04/16/2024 CLINICAL DATA:  Polytrauma, blunt. Stepped in a hole and fell this morning with head/facial injury.  EXAM: CT HEAD WITHOUT CONTRAST CT MAXILLOFACIAL WITHOUT CONTRAST CT CERVICAL SPINE WITHOUT CONTRAST TECHNIQUE: Multidetector CT imaging of the head, cervical spine, and maxillofacial structures were performed using the standard protocol without intravenous contrast. Multiplanar CT image reconstructions of the cervical spine and maxillofacial structures were also generated. RADIATION DOSE REDUCTION: This exam was performed according to the departmental dose-optimization program which includes automated exposure control, adjustment of the mA and/or kV according to patient size and/or use of iterative reconstruction technique. COMPARISON:  CT maxillofacial 05/30/2023.  CT head 10/06/2018. FINDINGS: CT HEAD FINDINGS Brain: There is no evidence of an acute infarct, intracranial hemorrhage, mass, midline shift, or extra-axial fluid collection. There is mild generalized cerebral atrophy. Vascular: Calcified  atherosclerosis at the skull base. No hyperdense vessel. Skull: No fracture or suspicious lesion. Other: None. CT MAXILLOFACIAL FINDINGS Osseous: No acute fracture, mandibular dislocation, or destructive process. Interval healing of the nasal bone fractures on the 2024 maxillofacial CT. Edentulous. Orbits: Bilateral cataract extraction. Sinuses: Moderate mucosal thickening in the ethmoid sinuses and mild scattered mucosal thickening elsewhere. No sizable fluid level. Clear mastoid air cells and middle ear cavities. Soft tissues: Unremarkable. CT CERVICAL SPINE FINDINGS Alignment: Cervical spine straightening.  No significant listhesis. Skull base and vertebrae: No acute fracture or suspicious lesion. Soft tissues and spinal canal: No prevertebral fluid or swelling. No visible canal hematoma. Disc levels: Widespread disc degeneration, including severe disc space narrowing from C3-4 through C5-6. Moderate to severe multilevel neural foraminal stenosis. At least moderate spinal stenosis at C4-5 and C5-6. Upper chest: Clear lung apices. Other: Mild atherosclerotic calcification at the carotid bifurcations. IMPRESSION: 1. No evidence of acute intracranial abnormality. 2. No acute facial or cervical spine fracture. Electronically Signed   By: Dasie Hamburg M.D.   On: 04/16/2024 15:01   DG Ankle Complete Left Result Date: 04/16/2024 CLINICAL DATA:  Fall and trauma to the left lower extremity. EXAM: LEFT ANKLE COMPLETE - 3+ VIEW COMPARISON:  Left ankle radiograph dated 10/10/2019. FINDINGS: No acute fracture or dislocation. The bones are osteopenic. No arthritic changes. The ankle mortise is intact. The soft tissues are unremarkable. IMPRESSION: 1. No acute fracture or dislocation. 2. Osteopenia. Electronically Signed   By: Vanetta Chou M.D.   On: 04/16/2024 13:37     Procedures   Medications Ordered in the ED - No data to display                                  Medical Decision Making Amount and/or  Complexity of Data Reviewed Radiology: ordered.   Medical Decision Making / ED Course   This patient presents to the ED for concern of fall, head injury, this involves an extensive number of treatment options, and is a complaint that carries with it a high risk of complications and morbidity.  The differential diagnosis includes fracture, acute intracranial bleed,  MDM: 78 year old female presents today after a fall.  This was a mechanical fall.  No loss consciousness.  Not on any blood thinning medicine.  Has superficial abrasions to the right side of her face.  No bleeding.  No laceration.  Tenderness over the left ankle.  No other joint tenderness or pain. CT head, CT maxillofacial, CT C-spine ordered and without acute process.  Left ankle x-ray without acute process. Will provide cam boot for comfort and support. Supportive care discussed. Discharged in stable condition.  Return precaution discussed.  She will follow-up with  her PCP.  Lab Tests: -I ordered, reviewed, and interpreted labs.   The pertinent results include:   Labs Reviewed - No data to display    EKG  EKG Interpretation Date/Time:    Ventricular Rate:    PR Interval:    QRS Duration:    QT Interval:    QTC Calculation:   R Axis:      Text Interpretation:           Imaging Studies ordered: I ordered imaging studies including CT head, CT maxillofacial, CT C-spine, left ankle x-ray I independently visualized and interpreted imaging. I agree with the radiologist interpretation   Medicines ordered and prescription drug management: No orders of the defined types were placed in this encounter.   -I have reviewed the patients home medicines and have made adjustments as needed   Reevaluation: After the interventions noted above, I reevaluated the patient and found that they have :improved  Co morbidities that complicate the patient evaluation  Past Medical History:  Diagnosis Date   Anemia    Ankle  fracture, right    past hx. -no surgery   Anxiety    Asthma    CHF (congestive heart failure) (HCC) 2009   Chronic lower back pain    Collagen vascular disease (HCC)    COPD (chronic obstructive pulmonary disease) (HCC)    Depression    Fibromyalgia    GERD (gastroesophageal reflux disease)    Hyperlipidemia    Hypertension    Immature cataract of both eyes    Myocardial infarction (HCC)    I've had a light one; don't know when it happened (08/27/2017)   Osteoarthritis    Peripheral neuropathy    legs and feet   Persistent atrial fibrillation (HCC)    Presence of Watchman left atrial appendage closure device 08/01/2021   24 mm Watchman FLXDevice LOT # [id number] by Dr. Cindie   Tremors of nervous system    noted in hands by pt last 6 months   Tubular adenoma of colon       Dispostion: Charged in stable condition.  Return precaution discussed.  Patient voices understanding and is in agreement with plan.    Final diagnoses:  Fall, initial encounter  Injury of head, initial encounter    ED Discharge Orders     None          Hildegard Loge, PA-C 04/16/24 1526    Elnor Savant A, DO 04/19/24 1334

## 2024-04-16 NOTE — Discharge Instructions (Signed)
 CT scan of the head, neck, face did not show any concerning findings.  Left ankle x-ray did not show any fracture.  I have given you a walking boot for an ankle sprain.  Follow-up with your primary care doctor.  Return for any concerning symptoms.

## 2024-04-17 DIAGNOSIS — S99812A Other specified injuries of left ankle, initial encounter: Secondary | ICD-10-CM | POA: Diagnosis not present

## 2024-04-21 DIAGNOSIS — M79672 Pain in left foot: Secondary | ICD-10-CM | POA: Diagnosis not present

## 2024-04-21 DIAGNOSIS — L84 Corns and callosities: Secondary | ICD-10-CM | POA: Diagnosis not present

## 2024-04-21 DIAGNOSIS — M79671 Pain in right foot: Secondary | ICD-10-CM | POA: Diagnosis not present

## 2024-04-21 DIAGNOSIS — M7740 Metatarsalgia, unspecified foot: Secondary | ICD-10-CM | POA: Diagnosis not present

## 2024-04-21 DIAGNOSIS — G609 Hereditary and idiopathic neuropathy, unspecified: Secondary | ICD-10-CM | POA: Diagnosis not present

## 2024-05-03 ENCOUNTER — Encounter: Payer: Self-pay | Admitting: Family

## 2024-05-03 ENCOUNTER — Ambulatory Visit (INDEPENDENT_AMBULATORY_CARE_PROVIDER_SITE_OTHER): Admitting: Family

## 2024-05-03 VITALS — BP 117/75 | HR 85 | Temp 97.6°F | Ht 63.0 in | Wt 136.0 lb

## 2024-05-03 DIAGNOSIS — E538 Deficiency of other specified B group vitamins: Secondary | ICD-10-CM | POA: Diagnosis not present

## 2024-05-03 DIAGNOSIS — D509 Iron deficiency anemia, unspecified: Secondary | ICD-10-CM

## 2024-05-03 DIAGNOSIS — Z95811 Presence of heart assist device: Secondary | ICD-10-CM | POA: Diagnosis not present

## 2024-05-03 DIAGNOSIS — I10 Essential (primary) hypertension: Secondary | ICD-10-CM | POA: Diagnosis not present

## 2024-05-03 DIAGNOSIS — G4700 Insomnia, unspecified: Secondary | ICD-10-CM | POA: Diagnosis not present

## 2024-05-03 DIAGNOSIS — M810 Age-related osteoporosis without current pathological fracture: Secondary | ICD-10-CM | POA: Diagnosis not present

## 2024-05-03 DIAGNOSIS — I2510 Atherosclerotic heart disease of native coronary artery without angina pectoris: Secondary | ICD-10-CM

## 2024-05-03 DIAGNOSIS — I5032 Chronic diastolic (congestive) heart failure: Secondary | ICD-10-CM

## 2024-05-03 MED ORDER — RISEDRONATE SODIUM 35 MG PO TABS: 35.0000 mg | ORAL_TABLET | ORAL | 1 refills | Status: DC

## 2024-05-03 NOTE — Patient Instructions (Signed)
Eating Plan for Osteoporosis Osteoporosis causes your bones to become weak and brittle. This puts you at greater risk for bone breaks (fractures) from small bumps or falls. Making changes to your diet and increasing your physical activity can help strengthen your bones and improve your overall health. Calcium and vitamin D are nutrients that play an important role in bone health. Vitamin D helps your body use calcium and strengthen bones. It is important to get enough calcium and vitamin D as part of your eating plan for osteoporosis. What are tips for following this plan? Reading food labels Try to get at least 1,000 milligrams (mg) of calcium each day. Look for foods that have at least 50 mg of calcium per serving. Talk with your health care provider about taking a calcium supplement if you do not get enough calcium from food. Do not have more than 2,500 mg of calcium each day. This is the upper limit for food and nutritional supplements combined. Too much calcium may cause constipation and prevent you from absorbing other important nutrients. Choose foods that contain vitamin D. Take a daily vitamin supplement that contains 800-1,000 international units (IU) of vitamin D. The amount may be different depending on your age, body weight, and where you live. Talk with your dietitian or health care provider about how much vitamin D is right for you. Avoid foods that have more than 300 mg of sodium per serving. Too much sodium can cause your body to lose calcium. Talk with your dietitian or health care provider about how much sodium you are allowed each day. Shopping Do not buy foods with added salt, including: Salted snacks. Rosita [employer]. Canned soups. Canned meats. Processed meats, such as bacon or precooked or cured meat like sausages or meat loaves. Smoked fish. Meal planning Eat balanced meals that contain protein foods, fruits and vegetables, and foods rich in calcium and vitamin D. Eat at least  5 servings of fruits and vegetables each day. Eat 5-6 oz (142-170 g) of lean meat, poultry, fish, eggs, or beans each day. Lifestyle Do not use any products that contain nicotine or tobacco, such as cigarettes, e-cigarettes, and chewing tobacco. If you need help quitting, ask your health care provider. If your health care provider recommends that you lose weight: Work with a dietitian to develop an eating plan that will help you reach your desired weight goal. Exercise for at least 30 minutes a day, 5 or more days a week, or as told by your health care provider. Work with a physical therapist to develop an exercise plan that includes flexibility, balance, and strength exercises. Do not focus only on aerobic exercise. Do not drink alcohol if: Your health care provider tells you not to drink. You are pregnant, may be pregnant, or are planning to become pregnant. If you drink alcohol: Limit how much you use to: 0-1 drink a day for women. 0-2 drinks a day for men. Be aware of how much alcohol is in your drink. In the [place] one drink equals one 12 oz bottle of beer (355 mL), one 5 oz glass of wine (148 mL), or one 1 oz glass of hard liquor (44 mL). What foods should I eat? Foods high in calcium  Yogurt. Yogurt with fruit. Milk. Evaporated skim milk. Dry milk powder. Calcium-fortified orange juice. Parmesan cheese. Part-skim ricotta cheese. Natural hard cheese. Cream cheese. Cottage cheese. Canned sardines. Canned salmon. Calcium-treated tofu. Calcium-fortified cereal bar. Calcium-fortified cereal. Calcium-fortified graham crackers. Cooked collard greens. Turnip greens. Broccoli.  Kale. Almonds. White beans. Corn tortilla. Foods high in vitamin D Cod liver oil. Fatty fish, such as tuna, mackerel, and salmon. Milk. Fortified soy milk. Fortified fruit juice. Yogurt. Margarine. Egg yolks. Foods high in protein Beef. Lamb. Pork tenderloin. Chicken breast. Tuna (canned). Fish  fillet. Tofu. Cooked soy beans. Soy patty. Beans (canned or cooked). Cottage cheese. Yogurt. Peanut butter. Pumpkin seeds. Nuts. Sunflower seeds. Hard cheese. Milk or other milk products, such as soy milk. The items listed above may not be a complete list of foods and beverages you can eat. Contact a dietitian for more options. Summary Calcium and vitamin D are nutrients that play an important role in bone health and are an important part of your eating plan for osteoporosis. Eat balanced meals that contain protein foods, fruits and vegetables, and foods rich in calcium and vitamin D. Avoid foods that have more than 300 mg of sodium per serving. Too much sodium can cause your body to lose calcium. Exercise is an important part of prevention and treatment of osteoporosis. Aim for at least 30 minutes a day, 5 days a week. This information is not intended to replace advice given to you by your health care provider. Make sure you discuss any questions you have with your health care provider. Document Revised: 02/23/2020 Document Reviewed: 02/23/2020 Elsevier Patient Education  2024 [employer].

## 2024-05-03 NOTE — Progress Notes (Signed)
 Subjective:    Patient ID: Sabrina [name], female    DOB: 09-22-1946, 78 y.o.   MRN: [id number]  Chief Complaint  Patient presents with   Follow-up   Pt presents to the office today for chronic follow up.   Pt is followed by Cardiologists every 4 months for A Fib and CHF. Has a pacemaker.   Has atherosclerosis and takes Lipitor daily.    She has osteoporosis and was taking Fosamax  weekly. States this is causing stomach cramps.  Takes Vit D and Calcium  daily. Last Dexa scan 03/01/24.   She has peripheral neuropathy and takes gabapentin  100 mg BID.   Complaining of callus on ball of her left foot. Followed by podiatry.   Congestive Heart Failure Presents for follow-up visit. Associated symptoms include fatigue. Pertinent negatives include no edema or shortness of breath. The symptoms have been stable.  Asthma There is no cough, shortness of breath or wheezing. This is a chronic problem. The current episode started more than 1 year ago. The problem occurs intermittently. Associated symptoms include heartburn and malaise/fatigue. She reports moderate improvement on treatment. Her past medical history is significant for asthma.  Gastroesophageal Reflux She complains of belching and heartburn. She reports no coughing or no wheezing. This is a chronic problem. The current episode started more than 1 year ago. The problem occurs occasionally. The symptoms are aggravated by certain foods. Associated symptoms include fatigue. She has tried a PPI for the symptoms. The treatment provided moderate relief.  Anemia Presents for follow-up visit. Symptoms include malaise/fatigue. There has been no bruising/bleeding easily. There is no history of chronic renal disease.  Insomnia Primary symptoms: difficulty falling asleep, frequent awakening, malaise/fatigue.   The current episode started more than one year. The onset quality is gradual. The problem occurs intermittently. Past treatments include  medication. The treatment provided moderate relief. PMH includes: depression.   Hyperlipidemia This is a chronic problem. The current episode started more than 1 year ago. The problem is controlled. Recent lipid tests were reviewed and are normal. She has no history of chronic renal disease. Pertinent negatives include no shortness of breath. Current antihyperlipidemic treatment includes statins. The current treatment provides moderate improvement of lipids. Risk factors for coronary artery disease include dyslipidemia, hypertension, a sedentary lifestyle and post-menopausal.  Anxiety Presents for follow-up visit. Symptoms include excessive worry, insomnia and nervous/anxious behavior. Patient reports no shortness of breath. Symptoms occur occasionally. The severity of symptoms is mild.   Her past medical history is significant for asthma.  Depression        This is a chronic problem.  The current episode started more than 1 year ago.   The onset quality is gradual.   The problem occurs intermittently.  Associated symptoms include fatigue and insomnia.  Associated symptoms include no helplessness, no hopelessness and not sad.  Past treatments include SNRIs - Serotonin and norepinephrine reuptake inhibitors.  Past medical history includes anxiety.       Review of Systems  Constitutional:  Positive for fatigue and malaise/fatigue.  Respiratory:  Negative for cough, shortness of breath and wheezing.   Gastrointestinal:  Positive for heartburn.  Hematological:  Does not bruise/bleed easily.  Psychiatric/Behavioral:  Positive for depression. The patient is nervous/anxious and has insomnia.   All other systems reviewed and are negative.  Family History  Problem Relation Age of Onset   Cancer Mother    Alzheimer's disease Mother    Heart disease Mother    Arthritis Father  Asthma Daughter    Arthritis Daughter    Obesity Daughter    Colon cancer Paternal Uncle 34   Arthritis Son     Hyperlipidemia Son    Obesity Son    Arthritis Son    Obesity Son    BRCA 1/2 Neg Hx    Social History   Socioeconomic History   Marital status: Divorced    Spouse name: Not on file   Number of children: 3   Years of education: 2 years of college   Highest education level: Some college, no degree  Occupational History   Occupation: Retired  Tobacco Use   Smoking status: Former    Current packs/day: 0.00    Average packs/day: 0.5 packs/day for 25.0 years (12.5 ttl pk-yrs)    Types: Cigarettes    Start date: 02/27/1968    Quit date: 02/26/1993    Years since quitting: 31.2   Smokeless tobacco: Never  Vaping Use   Vaping status: Never Used  Substance and Sexual Activity   Alcohol use: Not Currently    Comment: 08/27/2017 nothing since early 2000s   Drug use: Not Currently   Sexual activity: Not Currently    Birth control/protection: Surgical    Comment: Hyst, Menarche-[age], First IC @ [age], Partners >5, DES-neg  Other Topics Concern   Not on file  Social History Narrative   ** Merged History Encounter **       Pt is right handed Lives in single story home with her grandson Has 3 adult children Associated degree  Retired Lawyer   Social Drivers of Corporate investment banker Strain: Low Risk  (03/04/2023)   Overall Financial Resource Strain (CARDIA)    Difficulty of Paying Living Expenses: Not hard at all  Food Insecurity: No Food Insecurity (03/04/2023)   Hunger Vital Sign    Worried About Running Out of Food in the Last Year: Never true    Ran Out of Food in the Last Year: Never true  Transportation Needs: No Transportation Needs (03/04/2023)   PRAPARE - Administrator, Civil Service (Medical): No    Lack of Transportation (Non-Medical): No  Physical Activity: Insufficiently Active (03/04/2023)   Exercise Vital Sign    Days of Exercise per Week: 3 days    Minutes of Exercise per Session: 30 min  Stress: No Stress Concern Present (03/04/2023)   [employer] of Occupational Health - Occupational Stress Questionnaire    Feeling of Stress : Not at all  Social Connections: Moderately Integrated (03/04/2023)   Social Connection and Isolation Panel    Frequency of Communication with Friends and Family: More than three times a week    Frequency of Social Gatherings with Friends and Family: More than three times a week    Attends Religious Services: More than 4 times per year    Active Member of [employer] or Organizations: Yes    Attends Engineer, structural: More than 4 times per year    Marital Status: Divorced       Objective:   Physical Exam Vitals reviewed.  Constitutional:      General: She is not in acute distress.    Appearance: She is well-developed.  HENT:     Head: Normocephalic and atraumatic.     Right Ear: Tympanic membrane normal.     Left Ear: Tympanic membrane normal.  Eyes:     Pupils: Pupils are equal, round, and reactive to light.  Neck:  Thyroid : No thyromegaly.  Cardiovascular:     Rate and Rhythm: Normal rate and regular rhythm.     Heart sounds: Normal heart sounds. No murmur heard. Pulmonary:     Effort: Pulmonary effort is normal. No respiratory distress.     Breath sounds: Normal breath sounds. No wheezing.  Abdominal:     General: Bowel sounds are normal. There is no distension.     Palpations: Abdomen is soft.     Tenderness: There is no abdominal tenderness.  Musculoskeletal:        General: No tenderness. Normal range of motion.     Cervical back: Normal range of motion and neck supple.  Skin:    General: Skin is warm and dry.     Comments: Callus of ball on right foot  Neurological:     Mental Status: She is alert and oriented to person, place, and time.     Cranial Nerves: No cranial nerve deficit.     Motor: Weakness present.     Deep Tendon Reflexes: Reflexes are normal and symmetric.  Psychiatric:        Behavior: Behavior normal.        Thought Content: Thought content  normal.        Judgment: Judgment normal.      BP 117/75   Pulse 85   Temp 97.6 F (36.4 C) (Temporal)   Ht 5' 3 (1.6 m)   Wt 136 lb (61.7 kg)   SpO2 98%   BMI 24.09 kg/m       Assessment & Plan:   KEAUNA BRASEL comes in today with chief complaint of Follow-up   Diagnosis and orders addressed:  1. Benign essential HTN (Primary) - CMP14+EGFR - CBC with Differential/Platelet  2. Chronic diastolic heart failure (HCC) - CMP14+EGFR - CBC with Differential/Platelet  3. Iron deficiency anemia, unspecified iron deficiency anemia type - CMP14+EGFR - Iron, TIBC and Ferritin Panel - CBC with Differential/Platelet  4. Insomnia, unspecified type - CMP14+EGFR - CBC with Differential/Platelet  5. Osteoporosis, unspecified osteoporosis type, unspecified pathological fracture presence Will change fosamax  to Actonel   Continue calcium  and vit D - CMP14+EGFR - CBC with Differential/Platelet - risedronate  (ACTONEL ) 35 MG tablet; Take 1 tablet (35 mg total) by mouth every 7 (seven) days. with water  on empty stomach, nothing by mouth or lie down for next 30 minutes.  Dispense: 12 tablet; Refill: 1  6. Atherosclerosis of native coronary artery of native heart without angina pectoris - CMP14+EGFR - CBC with Differential/Platelet  7. Presence of heart assist device (HCC) - CMP14+EGFR - CBC with Differential/Platelet    Labs pending Continue current medications  Keep follow up with Cardiologists  Health Maintenance reviewed Diet and exercise encouraged  Follow up plan: 4 month   Bari Learn, FNP

## 2024-05-04 ENCOUNTER — Other Ambulatory Visit: Payer: Self-pay | Admitting: Cardiology

## 2024-05-04 LAB — CBC WITH DIFFERENTIAL/PLATELET
Basophils Absolute: 0.1 x10E3/uL (ref 0.0–0.2)
Basos: 1 %
EOS (ABSOLUTE): 0.3 x10E3/uL (ref 0.0–0.4)
Eos: 3 %
Hematocrit: 40.8 % (ref 34.0–46.6)
Hemoglobin: 12.4 g/dL (ref 11.1–15.9)
Immature Grans (Abs): 0 x10E3/uL (ref 0.0–0.1)
Immature Granulocytes: 0 %
Lymphocytes Absolute: 1.4 x10E3/uL (ref 0.7–3.1)
Lymphs: 15 %
MCH: 26.5 pg — ABNORMAL LOW (ref 26.6–33.0)
MCHC: 30.4 g/dL — ABNORMAL LOW (ref 31.5–35.7)
MCV: 87 fL (ref 79–97)
Monocytes Absolute: 0.6 x10E3/uL (ref 0.1–0.9)
Monocytes: 6 %
Neutrophils Absolute: 7 x10E3/uL (ref 1.4–7.0)
Neutrophils: 75 %
Platelets: 354 x10E3/uL (ref 150–450)
RBC: 4.68 x10E6/uL (ref 3.77–5.28)
RDW: 14.5 % (ref 11.7–15.4)
WBC: 9.4 x10E3/uL (ref 3.4–10.8)

## 2024-05-04 LAB — CMP14+EGFR
ALT: 17 IU/L (ref 0–32)
AST: 25 IU/L (ref 0–40)
Albumin: 3.9 g/dL (ref 3.8–4.8)
Alkaline Phosphatase: 99 IU/L (ref 44–121)
BUN/Creatinine Ratio: 33 — ABNORMAL HIGH (ref 12–28)
BUN: 25 mg/dL (ref 8–27)
Bilirubin Total: 0.5 mg/dL (ref 0.0–1.2)
CO2: 24 mmol/L (ref 20–29)
Calcium: 9.1 mg/dL (ref 8.7–10.3)
Chloride: 105 mmol/L (ref 96–106)
Creatinine, Ser: 0.76 mg/dL (ref 0.57–1.00)
Globulin, Total: 2.6 g/dL (ref 1.5–4.5)
Glucose: 94 mg/dL (ref 70–99)
Potassium: 5.1 mmol/L (ref 3.5–5.2)
Sodium: 143 mmol/L (ref 134–144)
Total Protein: 6.5 g/dL (ref 6.0–8.5)
eGFR: 81 mL/min/1.73 (ref >59)

## 2024-05-04 LAB — IRON,TIBC AND FERRITIN PANEL
Ferritin: 8 ng/mL — ABNORMAL LOW (ref 15–150)
Iron Saturation: 7 % — CL (ref 15–55)
Iron: 29 ug/dL (ref 27–139)
Total Iron Binding Capacity: 394 ug/dL (ref 250–450)
UIBC: 365 ug/dL (ref 118–369)

## 2024-05-05 ENCOUNTER — Other Ambulatory Visit: Payer: Self-pay | Admitting: Family

## 2024-05-05 ENCOUNTER — Ambulatory Visit: Payer: Self-pay | Admitting: Family

## 2024-05-05 DIAGNOSIS — D509 Iron deficiency anemia, unspecified: Secondary | ICD-10-CM

## 2024-05-09 ENCOUNTER — Inpatient Hospital Stay

## 2024-05-09 ENCOUNTER — Encounter: Payer: Self-pay | Admitting: Hematology and Oncology

## 2024-05-09 ENCOUNTER — Ambulatory Visit: Attending: Hematology and Oncology | Admitting: Hematology and Oncology

## 2024-05-09 DIAGNOSIS — Z800 Family history of malignant neoplasm of digestive organs: Secondary | ICD-10-CM | POA: Insufficient documentation

## 2024-05-09 DIAGNOSIS — Z87891 Personal history of nicotine dependence: Secondary | ICD-10-CM | POA: Insufficient documentation

## 2024-05-09 DIAGNOSIS — D509 Iron deficiency anemia, unspecified: Secondary | ICD-10-CM | POA: Diagnosis not present

## 2024-05-09 DIAGNOSIS — Z9884 Bariatric surgery status: Secondary | ICD-10-CM | POA: Insufficient documentation

## 2024-05-09 DIAGNOSIS — K648 Other hemorrhoids: Secondary | ICD-10-CM | POA: Insufficient documentation

## 2024-05-09 NOTE — Assessment & Plan Note (Signed)
 The most likely cause of her anemia is due to chronic blood loss/malabsorption syndrome. We discussed some of the risks, benefits, and alternatives of intravenous iron infusions. The patient is symptomatic from anemia and the iron level is critically low. She tolerated oral iron supplement poorly and desires to achieved higher levels of iron faster for adequate hematopoesis. Some of the side-effects to be expected including risks of infusion reactions, phlebitis, headaches, nausea and fatigue.  The patient is willing to proceed. Patient education material was dispensed.  Goal is to keep ferritin level greater than 50 and resolution of anemia The patient is currently not anemic I believe she only need 1 dose of intravenous iron infusion She sees her primary care doctor every 3 to 4 months I recommend follow-up labs with iron studies with her primary care doctor at the end of the year If she needs more intravenous iron infusion in the future, she will call us 

## 2024-05-09 NOTE — Assessment & Plan Note (Signed)
 She will continue stool softener I suspect the oral iron supplement contributed to constipation and internal hemorrhoidal bleeding If her bleeding does not subside despite discontinuation of oral iron supplement, she will need to be referred back to GI for repeat colonoscopy

## 2024-05-09 NOTE — Assessment & Plan Note (Signed)
 She had gastric bypass surgery causing malabsorption She will continue vitamin B12 injection as directed by her primary care doctor She will likely need intermittent IV iron infusion in the future; the patient is on chronic long-term proton pump inhibitor that would reduce absorption of iron as well

## 2024-05-09 NOTE — Progress Notes (Signed)
 [facility] CONSULT NOTE  Patient Care Team: Lavell Bari LABOR, FNP as PCP - General (Family Medicine) Waddell Danelle ORN, MD as PCP - Electrophysiology (Cardiology) Alvan Dorn FALCON, MD as PCP - Cardiology (Cardiology) Georjean Darice HERO, MD as Consulting Physician (Neurology)  ASSESSMENT & PLAN:  Iron deficiency anemia The most likely cause of her anemia is due to chronic blood loss/malabsorption syndrome. We discussed some of the risks, benefits, and alternatives of intravenous iron infusions. The patient is symptomatic from anemia and the iron level is critically low. She tolerated oral iron supplement poorly and desires to achieved higher levels of iron faster for adequate hematopoesis. Some of the side-effects to be expected including risks of infusion reactions, phlebitis, headaches, nausea and fatigue.  The patient is willing to proceed. Patient education material was dispensed.  Goal is to keep ferritin level greater than 50 and resolution of anemia The patient is currently not anemic I believe she only need 1 dose of intravenous iron infusion She sees her primary care doctor every 3 to 4 months I recommend follow-up labs with iron studies with her primary care doctor at the end of the year If she needs more intravenous iron infusion in the future, she will call us    S/P gastric bypass She had gastric bypass surgery causing malabsorption She will continue vitamin B12 injection as directed by her primary care doctor She will likely need intermittent IV iron infusion in the future; the patient is on chronic long-term proton pump inhibitor that would reduce absorption of iron as well  Internal hemorrhoid, bleeding She will continue stool softener I suspect the oral iron supplement contributed to constipation and internal hemorrhoidal bleeding If her bleeding does not subside despite discontinuation of oral iron supplement, she will need to be referred back to GI for repeat  colonoscopy No orders of the defined types were placed in this encounter.   All questions were answered. The patient knows to call the clinic with any problems, questions or concerns.  The total time spent in the appointment was 60 minutes encounter with patients including review of chart and various tests results, discussions about plan of care and coordination of care plan  Almarie Bedford, MD 8/18/20258:50 AM   CHIEF COMPLAINTS/PURPOSE OF CONSULTATION:  Anemia  HISTORY OF PRESENTING ILLNESS:  Sabrina [name] 78 y.o. female is here because of iron deficiency  She was found to have abnormal CBC from routine blood work The patient have history of Roux-en-Y gastric bypass surgery many years ago Overall, she has lost 50 pounds and is currently maintaining her weight She had regular labs done by her primary care doctor and was noted to have persistent iron deficiency despite oral iron supplement In terms of her iron studies, she has low ferritin and iron saturation since 2023 On April 17, 2022, iron saturation 14%, ferritin 12 On March 01, 2024, iron saturation 11%, ferritin 10 On 05/03/2024, iron saturation 7%, ferritin of 8 In terms of her blood count, I reviewed her electronic records dated back to 2014 On February 25, 2013, her hemoglobin was 12 with low MCV Between 2014 to May 03, 2024, her hemoglobin in general is within normal limits, fluctuated from as low as 9.6 to as high as 14.7 She denies recent chest pain on exertion, pre-syncopal episodes, or palpitations. She has some shortness of breath on moderate exertion, She had not noticed any recent bleeding such as epistaxis or hematuria but does complain of intermittent hemorrhoidal bleeding.  In  the winter, it was daily but recently, since she started taking stool softener, she would have hemorrhoidal bleeding perhaps once a week The patient denies over the counter NSAID ingestion. She is on antiplatelets agents. Her last colonoscopy  was in 2017 .  She was noted to have diverticular disease She had no prior history or diagnosis of cancer. Her age appropriate screening programs are up-to-date. She denies any pica and eats a variety of diet. She never donated blood but has received blood transfusion The patient was prescribed oral iron supplements and she takes twice a day without success of improving her iron studies although she is not anemic With oral iron supplement, she has developed profound constipation and has to take 8 stool softener per day.  MEDICAL HISTORY:  Past Medical History:  Diagnosis Date   Anemia    Ankle fracture, right    past hx. -no surgery   Anxiety    Asthma    CHF (congestive heart failure) (HCC) 2009   Chronic lower back pain    Collagen vascular disease (HCC)    COPD (chronic obstructive pulmonary disease) (HCC)    Depression    Fibromyalgia    GERD (gastroesophageal reflux disease)    Hyperlipidemia    Hypertension    Immature cataract of both eyes    Myocardial infarction (HCC)    I've had a light one; don't know when it happened (08/27/2017)   Osteoarthritis    Peripheral neuropathy    legs and feet   Persistent atrial fibrillation (HCC)    Presence of Watchman left atrial appendage closure device 08/01/2021   24 mm Watchman FLXDevice LOT # [id number] by Dr. Cindie   Tremors of nervous system    noted in hands by pt last 6 months   Tubular adenoma of colon     SURGICAL HISTORY: Past Surgical History:  Procedure Laterality Date   APPENDECTOMY     AV NODE ABLATION N/A 08/27/2017   Procedure: AV NODE ABLATION;  Surgeon: Waddell Danelle ORN, MD;  Location: [facility];  Service: Cardiovascular;  Laterality: N/A;   CATARACT EXTRACTION W/PHACO Right 03/16/2023   Procedure: CATARACT EXTRACTION PHACO AND INTRAOCULAR LENS PLACEMENT (IOC);  Surgeon: Harrie Agent, MD;  Location: [facility];  Service: Ophthalmology;  Laterality: Right;  CDE: 10.62   CATARACT EXTRACTION W/PHACO Left  05/08/2023   Procedure: CATARACT EXTRACTION PHACO AND INTRAOCULAR LENS PLACEMENT (IOC);  Surgeon: Harrie Agent, MD;  Location: [facility];  Service: Ophthalmology;  Laterality: Left;  CDE 11.28   CHOLECYSTECTOMY OPEN  1978   DILATION AND CURETTAGE OF UTERUS     FEMUR FRACTURE SURGERY Left 2013   put 7 rod in it   FRACTURE SURGERY     IVC FILTER REMOVAL N/A 06/20/2021   Procedure: IVC FILTER REMOVAL;  Surgeon: Gretta Lonni PARAS, MD;  Location: [facility];  Service: Cardiovascular;  Laterality: N/A;   LAPAROSCOPY  08/22/2016   Procedure: LAPAROSCOPY DIAGNOSTIC;  Surgeon: Bernarda Ned, MD;  Location: [facility];  Service: General;;   LEFT ATRIAL APPENDAGE OCCLUSION N/A 08/01/2021   Procedure: LEFT ATRIAL APPENDAGE OCCLUSION;  Surgeon: Cindie Ole DASEN, MD;  Location: [facility];  Service: Cardiovascular;  Laterality: N/A;   LEFT HEART CATH AND CORONARY ANGIOGRAPHY N/A 02/06/2022   Procedure: LEFT HEART CATH AND CORONARY ANGIOGRAPHY;  Surgeon: Burnard Ned LABOR, MD;  Location: [facility];  Service: Cardiovascular;  Laterality: N/A;   MEDIAL PARTIAL KNEE REPLACEMENT Right 2005   @  [facility]   PACEMAKER IMPLANT N/A 08/27/2017   Procedure: PACEMAKER IMPLANT;  Surgeon: Waddell Danelle ORN, MD;  Location: [facility] INVASIVE CV LAB;  Service: Cardiovascular;  Laterality: N/A;   ROUX-EN-Y GASTRIC BYPASS  2002   [facility]   SPLENECTOMY  2002   TEE WITHOUT CARDIOVERSION N/A 08/01/2021   Procedure: TRANSESOPHAGEAL ECHOCARDIOGRAM (TEE);  Surgeon: Cindie Ole DASEN, MD;  Location: [facility] INVASIVE CV LAB;  Service: Cardiovascular;  Laterality: N/A;   TONSILLECTOMY  1944   TUBAL LIGATION     VAGINAL HYSTERECTOMY      SOCIAL HISTORY: Social History   Socioeconomic History   Marital status: Divorced    Spouse name: Not on file   Number of children: 3   Years of education: 2 years of college   Highest education level: Some college, no degree  Occupational History   Occupation:  Retired  Tobacco Use   Smoking status: Former    Current packs/day: 0.00    Average packs/day: 0.5 packs/day for 25.0 years (12.5 ttl pk-yrs)    Types: Cigarettes    Start date: 02/27/1968    Quit date: 02/26/1993    Years since quitting: 31.2   Smokeless tobacco: Never  Vaping Use   Vaping status: Never Used  Substance and Sexual Activity   Alcohol use: Not Currently    Comment: 08/27/2017 nothing since early 2000s   Drug use: Not Currently   Sexual activity: Not Currently    Birth control/protection: Surgical    Comment: Hyst, Menarche-[age], First IC @ [age], Partners >5, DES-neg  Other Topics Concern   Not on file  Social History Narrative   ** Merged History Encounter **       Pt is right handed Lives in single story home with her grandson Has 3 adult children Associated degree  Retired Lawyer   Social Drivers of Corporate investment banker Strain: Low Risk  (03/04/2023)   Overall Financial Resource Strain (CARDIA)    Difficulty of Paying Living Expenses: Not hard at all  Food Insecurity: No Food Insecurity (03/04/2023)   Hunger Vital Sign    Worried About Running Out of Food in the Last Year: Never true    Ran Out of Food in the Last Year: Never true  Transportation Needs: No Transportation Needs (03/04/2023)   PRAPARE - Administrator, Civil Service (Medical): No    Lack of Transportation (Non-Medical): No  Physical Activity: Insufficiently Active (03/04/2023)   Exercise Vital Sign    Days of Exercise per Week: 3 days    Minutes of Exercise per Session: 30 min  Stress: No Stress Concern Present (03/04/2023)   [employer] of Occupational Health - Occupational Stress Questionnaire    Feeling of Stress : Not at all  Social Connections: Moderately Integrated (03/04/2023)   Social Connection and Isolation Panel    Frequency of Communication with Friends and Family: More than three times a week    Frequency of Social Gatherings with Friends and Family: More  than three times a week    Attends Religious Services: More than 4 times per year    Active Member of [employer] or Organizations: Yes    Attends Banker Meetings: More than 4 times per year    Marital Status: Divorced  Intimate Partner Violence: Not At Risk (03/04/2023)   Humiliation, Afraid, Rape, and Kick questionnaire    Fear of Current or Ex-Partner: No    Emotionally Abused: No    Physically  Abused: No    Sexually Abused: No    FAMILY HISTORY: Family History  Problem Relation Age of Onset   Cancer Mother    Alzheimer's disease Mother    Heart disease Mother    Arthritis Father    Asthma Daughter    Arthritis Daughter    Obesity Daughter    Colon cancer Paternal Uncle 19   Arthritis Son    Hyperlipidemia Son    Obesity Son    Arthritis Son    Obesity Son    BRCA 1/2 Neg Hx     ALLERGIES:  is allergic to codeine.  MEDICATIONS:  Current Outpatient Medications  Medication Sig Dispense Refill   albuterol  (VENTOLIN  HFA) 108 (90 Base) MCG/ACT inhaler INHALE 2 PUFFS BY MOUTH EVERY 6 HOURS AS NEEDED FOR WHEEZE OR SHORTNESS OF BREATH 18 each 0   aspirin  EC 81 MG EC tablet Take 1 tablet (81 mg total) by mouth daily. Swallow whole. 30 tablet 11   atorvastatin  (LIPITOR) 80 MG tablet TAKE 1 TABLET BY MOUTH EVERY DAY 90 tablet 3   blood glucose meter kit and supplies Dispense based on patient and insurance preference. Use up to four times daily as directed. (FOR ICD-10 E10.9, E11.9). 1 each 0   Blood Glucose Monitoring Suppl (TRUE METRIX AIR GLUCOSE METER) w/Device KIT Test BS daily Dx R73.09 1 kit 0   cromolyn (NASALCROM) 5.2 MG/ACT nasal spray Place 1 spray into both nostrils 2 (two) times daily as needed for allergies.     docusate sodium (COLACE) 100 MG capsule Take 200 mg by mouth 2 (two) times daily.     DULoxetine  (CYMBALTA ) 60 MG capsule Take 1 capsule (60 mg total) by mouth daily. 90 capsule 1   estradiol  (ESTRACE ) 0.1 MG/GM vaginal cream Place 1 Applicatorful  vaginally at bedtime. 42.5 g 12   fluticasone  (FLONASE ) 50 MCG/ACT nasal spray SPRAY 2 SPRAYS INTO EACH NOSTRIL EVERY DAY 48 mL 3   furosemide  (LASIX ) 20 MG tablet Take 1 tablet (20 mg total) by mouth daily. 90 tablet 1   gabapentin  (NEURONTIN ) 100 MG capsule 100 mg in AM and 300 mg in evening 360 capsule 1   glucose blood (TRUE METRIX BLOOD GLUCOSE TEST) test strip Test BS daily Dx R73.09 100 each 3   methenamine  (HIPREX ) 1 g tablet Take 1 tablet (1 g total) by mouth 2 (two) times daily with a meal. Most effective when taken with a daily Vitamin C supplement. 60 tablet 11   Multiple Vitamin (MULTIVITAMIN WITH MINERALS) TABS tablet Take 1 tablet by mouth in the morning.     nitroGLYCERIN  (NITROSTAT ) 0.4 MG SL tablet Place 1 tablet (0.4 mg total) under the tongue every 5 (five) minutes x 3 doses as needed for chest pain. 25 tablet 12   Omega-3 Fatty Acids (FISH OIL PO) Take 1 capsule by mouth daily.     pantoprazole  (PROTONIX ) 40 MG tablet Take 1 tablet (40 mg total) by mouth 2 (two) times daily. 180 tablet 1   potassium chloride  SA (KLOR-CON  M) 20 MEQ tablet Take 1 tablet (20 mEq total) by mouth 2 (two) times daily. Take 1 tablet twice a day 180 tablet 1   risedronate  (ACTONEL ) 35 MG tablet Take 1 tablet (35 mg total) by mouth every 7 (seven) days. with water  on empty stomach, nothing by mouth or lie down for next 30 minutes. 12 tablet 1   SYMBICORT  80-4.5 MCG/ACT inhaler INHALE TWO PUFFS INTO THE LUNGS IN THE MORNING AND  2 PUFFS AT BEDTIME 30.6 each 0   triamcinolone  cream (KENALOG ) 0.1 % Apply 1 Application topically 2 (two) times daily. 60 g 2   TRUEplus Lancets 33G MISC Test BS daily Dx R73.09 100 each 3   vitamin C (ASCORBIC ACID) 500 MG tablet Take 500 mg by mouth daily.     Vitamin D , Ergocalciferol , (DRISDOL ) 1.25 MG (50000 UNIT) CAPS capsule Take 1 capsule (50,000 Units total) by mouth every 7 (seven) days. 12 capsule 1   Current Facility-Administered Medications  Medication Dose Route  Frequency Provider Last Rate Last Admin   cyanocobalamin  (VITAMIN B12) injection 1,000 mcg  1,000 mcg Intramuscular Q30 days Lavell Lye A, FNP   1,000 mcg at 05/03/24 1106    REVIEW OF SYSTEMS:   Constitutional: Denies fevers, chills or abnormal night sweats Eyes: Denies blurriness of vision, double vision or watery eyes Ears, nose, mouth, throat, and face: Denies mucositis or sore throat Respiratory: Denies cough, dyspnea or wheezes Cardiovascular: Denies palpitation, chest discomfort or lower extremity swelling Gastrointestinal:  Denies nausea, heartburn or change in bowel habits Skin: Denies abnormal skin rashes Lymphatics: Denies new lymphadenopathy or easy bruising Neurological:Denies numbness, tingling or new weaknesses Behavioral/Psych: Mood is stable, no new changes  All other systems were reviewed with the patient and are negative.  PHYSICAL EXAMINATION: ECOG PERFORMANCE STATUS: 1 - Symptomatic but completely ambulatory  There were no vitals filed for this visit. There were no vitals filed for this visit.  GENERAL:alert, no distress and comfortable SKIN: skin color, texture, turgor are normal, no rashes or significant lesions EYES: normal, conjunctiva are pink and non-injected, sclera clear OROPHARYNX:no exudate, no erythema and lips, buccal mucosa, and tongue normal  NECK: supple, thyroid  normal size, non-tender, without nodularity LYMPH:  no palpable lymphadenopathy in the cervical, axillary or inguinal LUNGS: clear to auscultation and percussion with normal breathing effort HEART: regular rate & rhythm and no murmurs and no lower extremity edema ABDOMEN:abdomen soft, non-tender and normal bowel sounds Musculoskeletal:no cyanosis of digits and no clubbing  PSYCH: alert & oriented x 3 with fluent speech NEURO: no focal motor/sensory deficits  RADIOGRAPHIC STUDIES: I have personally reviewed the radiological images as listed and agreed with the findings in the  report. CT Head Wo Contrast Result Date: 04/16/2024 CLINICAL DATA:  Polytrauma, blunt. Stepped in a hole and fell this morning with head/facial injury. EXAM: CT HEAD WITHOUT CONTRAST CT MAXILLOFACIAL WITHOUT CONTRAST CT CERVICAL SPINE WITHOUT CONTRAST TECHNIQUE: Multidetector CT imaging of the head, cervical spine, and maxillofacial structures were performed using the standard protocol without intravenous contrast. Multiplanar CT image reconstructions of the cervical spine and maxillofacial structures were also generated. RADIATION DOSE REDUCTION: This exam was performed according to the departmental dose-optimization program which includes automated exposure control, adjustment of the mA and/or kV according to patient size and/or use of iterative reconstruction technique. COMPARISON:  CT maxillofacial 05/30/2023.  CT head 10/06/2018. FINDINGS: CT HEAD FINDINGS Brain: There is no evidence of an acute infarct, intracranial hemorrhage, mass, midline shift, or extra-axial fluid collection. There is mild generalized cerebral atrophy. Vascular: Calcified atherosclerosis at the skull base. No hyperdense vessel. Skull: No fracture or suspicious lesion. Other: None. CT MAXILLOFACIAL FINDINGS Osseous: No acute fracture, mandibular dislocation, or destructive process. Interval healing of the nasal bone fractures on the 2024 maxillofacial CT. Edentulous. Orbits: Bilateral cataract extraction. Sinuses: Moderate mucosal thickening in the ethmoid sinuses and mild scattered mucosal thickening elsewhere. No sizable fluid level. Clear mastoid air cells and middle ear cavities. Soft  tissues: Unremarkable. CT CERVICAL SPINE FINDINGS Alignment: Cervical spine straightening.  No significant listhesis. Skull base and vertebrae: No acute fracture or suspicious lesion. Soft tissues and spinal canal: No prevertebral fluid or swelling. No visible canal hematoma. Disc levels: Widespread disc degeneration, including severe disc space  narrowing from C3-4 through C5-6. Moderate to severe multilevel neural foraminal stenosis. At least moderate spinal stenosis at C4-5 and C5-6. Upper chest: Clear lung apices. Other: Mild atherosclerotic calcification at the carotid bifurcations. IMPRESSION: 1. No evidence of acute intracranial abnormality. 2. No acute facial or cervical spine fracture. Electronically Signed   By: Dasie Hamburg M.D.   On: 04/16/2024 15:01   CT Maxillofacial Wo Contrast Result Date: 04/16/2024 CLINICAL DATA:  Polytrauma, blunt. Stepped in a hole and fell this morning with head/facial injury. EXAM: CT HEAD WITHOUT CONTRAST CT MAXILLOFACIAL WITHOUT CONTRAST CT CERVICAL SPINE WITHOUT CONTRAST TECHNIQUE: Multidetector CT imaging of the head, cervical spine, and maxillofacial structures were performed using the standard protocol without intravenous contrast. Multiplanar CT image reconstructions of the cervical spine and maxillofacial structures were also generated. RADIATION DOSE REDUCTION: This exam was performed according to the departmental dose-optimization program which includes automated exposure control, adjustment of the mA and/or kV according to patient size and/or use of iterative reconstruction technique. COMPARISON:  CT maxillofacial 05/30/2023.  CT head 10/06/2018. FINDINGS: CT HEAD FINDINGS Brain: There is no evidence of an acute infarct, intracranial hemorrhage, mass, midline shift, or extra-axial fluid collection. There is mild generalized cerebral atrophy. Vascular: Calcified atherosclerosis at the skull base. No hyperdense vessel. Skull: No fracture or suspicious lesion. Other: None. CT MAXILLOFACIAL FINDINGS Osseous: No acute fracture, mandibular dislocation, or destructive process. Interval healing of the nasal bone fractures on the 2024 maxillofacial CT. Edentulous. Orbits: Bilateral cataract extraction. Sinuses: Moderate mucosal thickening in the ethmoid sinuses and mild scattered mucosal thickening elsewhere. No  sizable fluid level. Clear mastoid air cells and middle ear cavities. Soft tissues: Unremarkable. CT CERVICAL SPINE FINDINGS Alignment: Cervical spine straightening.  No significant listhesis. Skull base and vertebrae: No acute fracture or suspicious lesion. Soft tissues and spinal canal: No prevertebral fluid or swelling. No visible canal hematoma. Disc levels: Widespread disc degeneration, including severe disc space narrowing from C3-4 through C5-6. Moderate to severe multilevel neural foraminal stenosis. At least moderate spinal stenosis at C4-5 and C5-6. Upper chest: Clear lung apices. Other: Mild atherosclerotic calcification at the carotid bifurcations. IMPRESSION: 1. No evidence of acute intracranial abnormality. 2. No acute facial or cervical spine fracture. Electronically Signed   By: Dasie Hamburg M.D.   On: 04/16/2024 15:01   CT Cervical Spine Wo Contrast Result Date: 04/16/2024 CLINICAL DATA:  Polytrauma, blunt. Stepped in a hole and fell this morning with head/facial injury. EXAM: CT HEAD WITHOUT CONTRAST CT MAXILLOFACIAL WITHOUT CONTRAST CT CERVICAL SPINE WITHOUT CONTRAST TECHNIQUE: Multidetector CT imaging of the head, cervical spine, and maxillofacial structures were performed using the standard protocol without intravenous contrast. Multiplanar CT image reconstructions of the cervical spine and maxillofacial structures were also generated. RADIATION DOSE REDUCTION: This exam was performed according to the departmental dose-optimization program which includes automated exposure control, adjustment of the mA and/or kV according to patient size and/or use of iterative reconstruction technique. COMPARISON:  CT maxillofacial 05/30/2023.  CT head 10/06/2018. FINDINGS: CT HEAD FINDINGS Brain: There is no evidence of an acute infarct, intracranial hemorrhage, mass, midline shift, or extra-axial fluid collection. There is mild generalized cerebral atrophy. Vascular: Calcified atherosclerosis at the skull  base. No hyperdense vessel.  Skull: No fracture or suspicious lesion. Other: None. CT MAXILLOFACIAL FINDINGS Osseous: No acute fracture, mandibular dislocation, or destructive process. Interval healing of the nasal bone fractures on the 2024 maxillofacial CT. Edentulous. Orbits: Bilateral cataract extraction. Sinuses: Moderate mucosal thickening in the ethmoid sinuses and mild scattered mucosal thickening elsewhere. No sizable fluid level. Clear mastoid air cells and middle ear cavities. Soft tissues: Unremarkable. CT CERVICAL SPINE FINDINGS Alignment: Cervical spine straightening.  No significant listhesis. Skull base and vertebrae: No acute fracture or suspicious lesion. Soft tissues and spinal canal: No prevertebral fluid or swelling. No visible canal hematoma. Disc levels: Widespread disc degeneration, including severe disc space narrowing from C3-4 through C5-6. Moderate to severe multilevel neural foraminal stenosis. At least moderate spinal stenosis at C4-5 and C5-6. Upper chest: Clear lung apices. Other: Mild atherosclerotic calcification at the carotid bifurcations. IMPRESSION: 1. No evidence of acute intracranial abnormality. 2. No acute facial or cervical spine fracture. Electronically Signed   By: Dasie Hamburg M.D.   On: 04/16/2024 15:01   DG Ankle Complete Left Result Date: 04/16/2024 CLINICAL DATA:  Fall and trauma to the left lower extremity. EXAM: LEFT ANKLE COMPLETE - 3+ VIEW COMPARISON:  Left ankle radiograph dated 10/10/2019. FINDINGS: No acute fracture or dislocation. The bones are osteopenic. No arthritic changes. The ankle mortise is intact. The soft tissues are unremarkable. IMPRESSION: 1. No acute fracture or dislocation. 2. Osteopenia. Electronically Signed   By: Vanetta Chou M.D.   On: 04/16/2024 13:37

## 2024-05-10 ENCOUNTER — Inpatient Hospital Stay

## 2024-05-10 VITALS — BP 108/67 | HR 70 | Temp 97.9°F | Resp 16

## 2024-05-10 DIAGNOSIS — Z800 Family history of malignant neoplasm of digestive organs: Secondary | ICD-10-CM | POA: Diagnosis not present

## 2024-05-10 DIAGNOSIS — D509 Iron deficiency anemia, unspecified: Secondary | ICD-10-CM | POA: Diagnosis not present

## 2024-05-10 DIAGNOSIS — Z87891 Personal history of nicotine dependence: Secondary | ICD-10-CM | POA: Diagnosis not present

## 2024-05-10 DIAGNOSIS — K648 Other hemorrhoids: Secondary | ICD-10-CM | POA: Diagnosis not present

## 2024-05-10 DIAGNOSIS — Z9884 Bariatric surgery status: Secondary | ICD-10-CM | POA: Diagnosis not present

## 2024-05-10 MED ORDER — SODIUM CHLORIDE 0.9 % IV SOLN
INTRAVENOUS | Status: DC
Start: 2024-05-10 — End: 2024-05-10

## 2024-05-10 MED ORDER — SODIUM CHLORIDE 0.9 % IV SOLN
510.0000 mg | Freq: Once | INTRAVENOUS | Status: AC
  Administered 2024-05-10: 510 mg via INTRAVENOUS
  Filled 2024-05-10: qty 510

## 2024-05-10 NOTE — Progress Notes (Signed)
 Patient presents today for iron infusion.  Patient is in satisfactory condition with no new complaints voiced.  Vital signs are stable.  IV placed in R arm.  IV flushed well with good blood return noted.  Patient had Tylenol  and Zyrtec this morning at 10:00 am.  We will hold pre-medications.  We will proceed with infusion per provider orders.    Patient tolerated infusion well with no complaints voiced.  Patient left ambulatory in stable condition.  Vital signs stable at discharge.  Follow up as scheduled.

## 2024-05-10 NOTE — Patient Instructions (Signed)
 [facility] - A DEPT OF [facility]  Discharge Instructions: Thank you for choosing [facility] to provide your oncology and hematology care.  If you have a lab appointment with the Cancer Center - please note that after April 8th, 2024, all labs will be drawn in the cancer center.  You do not have to check in or register with the main entrance as you have in the past but will complete your check-in in the cancer center.  Wear comfortable clothing and clothing appropriate for easy access to any Portacath or PICC line.   We strive to give you quality time with your provider. You may need to reschedule your appointment if you arrive late (15 or more minutes).  Arriving late affects you and other patients whose appointments are after yours.  Also, if you miss three or more appointments without notifying the office, you may be dismissed from the clinic at the provider's discretion.      For prescription refill requests, have your pharmacy contact our office and allow 72 hours for refills to be completed.    Today you received the following:  Feraheme.  Ferumoxytol Injection What is this medication? FERUMOXYTOL (FER ue MOX i tol) treats low levels of iron in your body (iron deficiency anemia). Iron is a mineral that plays an important role in making red blood cells, which carry oxygen from your lungs to the rest of your body. This medicine may be used for other purposes; ask your health care provider or pharmacist if you have questions. COMMON BRAND NAME(S): Feraheme What should I tell my care team before I take this medication? They need to know if you have any of these conditions: Anemia not caused by low iron levels High levels of iron in the blood Magnetic resonance imaging (MRI) test scheduled An unusual or allergic reaction to iron, other medications, foods, dyes, or preservatives Pregnant or trying to get pregnant Breastfeeding How should I  use this medication? This medication is injected into a vein. It is given by your care team in a hospital or clinic setting. Talk to your care team the use of this medication in children. Special care may be needed. Overdosage: If you think you have taken too much of this medicine contact a poison control center or emergency room at once. NOTE: This medicine is only for you. Do not share this medicine with others. What if I miss a dose? It is important not to miss your dose. Call your care team if you are unable to keep an appointment. What may interact with this medication? Other iron products This list may not describe all possible interactions. Give your health care provider a list of all the medicines, herbs, non-prescription drugs, or dietary supplements you use. Also tell them if you smoke, drink alcohol, or use illegal drugs. Some items may interact with your medicine. What should I watch for while using this medication? Visit your care team for regular checks on your progress. Tell your care team if your symptoms do not start to get better or if they get worse. You may need blood work done while you are taking this medication. You may need to eat more foods that contain iron. Talk to your care team. Foods that contain iron include whole grains or cereals, dried fruits, beans, peas, leafy green vegetables, and organ meats (liver, kidney). What side effects may I notice from receiving this medication? Side effects that you should  report to your care team as soon as possible: Allergic reactions--skin rash, itching, hives, swelling of the face, lips, tongue, or throat Low blood pressure--dizziness, feeling faint or lightheaded, blurry vision Shortness of breath Side effects that usually do not require medical attention (report to your care team if they continue or are bothersome): Flushing Headache Joint pain Muscle pain Nausea Pain, redness, or irritation at injection site This list  may not describe all possible side effects. Call your doctor for medical advice about side effects. You may report side effects to FDA at 1-800-FDA-1088. Where should I keep my medication? This medication is given in a hospital or clinic. It will not be stored at home. NOTE: This sheet is a summary. It may not cover all possible information. If you have questions about this medicine, talk to your doctor, pharmacist, or health care provider.  2024 Elsevier/Gold Standard (2023-04-29 00:00:00)    To help prevent nausea and vomiting after your treatment, we encourage you to take your nausea medication as directed.  BELOW ARE SYMPTOMS THAT SHOULD BE REPORTED IMMEDIATELY: *FEVER GREATER THAN 100.4 F (38 C) OR HIGHER *CHILLS OR SWEATING *NAUSEA AND VOMITING THAT IS NOT CONTROLLED WITH YOUR NAUSEA MEDICATION *UNUSUAL SHORTNESS OF BREATH *UNUSUAL BRUISING OR BLEEDING *URINARY PROBLEMS (pain or burning when urinating, or frequent urination) *BOWEL PROBLEMS (unusual diarrhea, constipation, pain near the anus) TENDERNESS IN MOUTH AND THROAT WITH OR WITHOUT PRESENCE OF ULCERS (sore throat, sores in mouth, or a toothache) UNUSUAL RASH, SWELLING OR PAIN  UNUSUAL VAGINAL DISCHARGE OR ITCHING   Items with * indicate a potential emergency and should be followed up as soon as possible or go to the Emergency Department if any problems should occur.  Please show the CHEMOTHERAPY ALERT CARD or IMMUNOTHERAPY ALERT CARD at check-in to the Emergency Department and triage nurse.  Should you have questions after your visit or need to cancel or reschedule your appointment, please contact [facility] [place] - A DEPT OF [facility]igh[facility] [phone]  and follow the prompts.  Office hours are 8:00 a.m. to 4:30 p.m. Monday - Friday. Please note that voicemails left after 4:00 p.m. may not be returned until the following business day.  We are closed weekends and major holidays. You have access to a  nurse at all times for urgent questions. Please call the main number to the clinic [phone] and follow the prompts.  For any non-urgent questions, you may also contact your provider using MyChart. We now offer e-Visits for anyone 39 and older to request care online for non-urgent symptoms. For details visit [email or web address].   Also download the MyChart app! Go to the app store, search "MyChart", open the app, select [place], and log in with your MyChart username and password.

## 2024-05-10 NOTE — Progress Notes (Signed)
 Pharmacy has substituted cetirizine 10 mg orally x 1 as premedication for   Loratidine discontinued.  S.O. Dr Ennis Molt, PharmD

## 2024-05-31 ENCOUNTER — Other Ambulatory Visit: Payer: Self-pay | Admitting: Family

## 2024-05-31 DIAGNOSIS — J4520 Mild intermittent asthma, uncomplicated: Secondary | ICD-10-CM

## 2024-05-31 DIAGNOSIS — K219 Gastro-esophageal reflux disease without esophagitis: Secondary | ICD-10-CM

## 2024-05-31 DIAGNOSIS — F411 Generalized anxiety disorder: Secondary | ICD-10-CM

## 2024-06-02 ENCOUNTER — Ambulatory Visit (INDEPENDENT_AMBULATORY_CARE_PROVIDER_SITE_OTHER): Admitting: *Deleted

## 2024-06-02 DIAGNOSIS — E538 Deficiency of other specified B group vitamins: Secondary | ICD-10-CM

## 2024-06-02 NOTE — Progress Notes (Signed)
 Patient is in office today for a nurse visit for B12 Injection. Patient Injection was given in the  Left deltoid. Patient tolerated injection well.

## 2024-06-08 DIAGNOSIS — S61219A Laceration without foreign body of unspecified finger without damage to nail, initial encounter: Secondary | ICD-10-CM | POA: Diagnosis not present

## 2024-06-20 ENCOUNTER — Telehealth: Payer: Self-pay | Admitting: Family

## 2024-06-20 NOTE — Telephone Encounter (Signed)
 Copied from CRM #[id number]. Topic: General - Other >> Jun 20, 2024  4:31 PM Zebedee SAUNDERS wrote: Reason for CRM: Pt would like a call [phone] back regarding a No Show on 03/04/2024 for AWV, which pt wishes to dispute.

## 2024-06-27 ENCOUNTER — Ambulatory Visit (INDEPENDENT_AMBULATORY_CARE_PROVIDER_SITE_OTHER): Payer: Medicare HMO

## 2024-06-27 DIAGNOSIS — I4891 Unspecified atrial fibrillation: Secondary | ICD-10-CM | POA: Diagnosis not present

## 2024-06-29 LAB — CUP PACEART REMOTE DEVICE CHECK
Battery Remaining Longevity: 25 mo
Battery Voltage: 2.83 V
Brady Statistic AP VP Percent: 0.23 %
Brady Statistic AP VS Percent: 99.73 %
Brady Statistic AS VP Percent: 0 %
Brady Statistic AS VS Percent: 0.05 %
Brady Statistic RA Percent Paced: 100 %
Brady Statistic RV Percent Paced: 0.23 %
Date Time Interrogation Session: 20251006022819
Implantable Lead Connection Status: 753985
Implantable Lead Connection Status: 753985
Implantable Lead Implant Date: 20181206
Implantable Lead Implant Date: 20181206
Implantable Lead Location: 753860
Implantable Lead Location: 753860
Implantable Lead Model: 3830
Implantable Lead Model: 5076
Implantable Pulse Generator Implant Date: 20181206
Lead Channel Impedance Value: 266 Ohm
Lead Channel Impedance Value: 323 Ohm
Lead Channel Impedance Value: 361 Ohm
Lead Channel Impedance Value: 399 Ohm
Lead Channel Sensing Intrinsic Amplitude: 17.875 mV
Lead Channel Sensing Intrinsic Amplitude: 17.875 mV
Lead Channel Sensing Intrinsic Amplitude: 3.625 mV
Lead Channel Sensing Intrinsic Amplitude: 4.75 mV
Lead Channel Setting Pacing Amplitude: 2 V
Lead Channel Setting Pacing Amplitude: 2.5 V
Lead Channel Setting Pacing Pulse Width: 0.3 ms
Lead Channel Setting Sensing Sensitivity: 2 mV
Zone Setting Status: 755011
Zone Setting Status: 755011

## 2024-06-30 ENCOUNTER — Ambulatory Visit: Payer: Self-pay | Admitting: Internal Medicine

## 2024-06-30 NOTE — Progress Notes (Signed)
 Remote PPM Transmission

## 2024-07-01 ENCOUNTER — Ambulatory Visit (INDEPENDENT_AMBULATORY_CARE_PROVIDER_SITE_OTHER)

## 2024-07-01 DIAGNOSIS — E538 Deficiency of other specified B group vitamins: Secondary | ICD-10-CM

## 2024-07-01 NOTE — Progress Notes (Signed)
 Patient presented to office for a B-12 injection. B-12 given in right arm. Patient tolerated well

## 2024-07-04 NOTE — Progress Notes (Signed)
 Remote PPM Transmission

## 2024-07-05 ENCOUNTER — Ambulatory Visit (INDEPENDENT_AMBULATORY_CARE_PROVIDER_SITE_OTHER)

## 2024-07-05 VITALS — BP 117/75 | Temp 85.0°F | Ht 63.0 in | Wt 136.0 lb

## 2024-07-05 DIAGNOSIS — Z0000 Encounter for general adult medical examination without abnormal findings: Secondary | ICD-10-CM | POA: Diagnosis not present

## 2024-07-05 NOTE — Patient Instructions (Signed)
 Sabrina [name],  Thank you for taking the time for your Medicare Wellness Visit. I appreciate your continued commitment to your health goals. Please review the care plan we discussed, and feel free to reach out if I can assist you further.  Medicare recommends these wellness visits once per year to help you and your care team stay ahead of potential health issues. These visits are designed to focus on prevention, allowing your provider to concentrate on managing your acute and chronic conditions during your regular appointments.  Please note that Annual Wellness Visits do not include a physical exam. Some assessments may be limited, especially if the visit was conducted virtually. If needed, we may recommend a separate in-person follow-up with your provider.  Ongoing Care Seeing your primary care provider every 3 to 6 months helps us  monitor your health and provide consistent, personalized care.   Referrals If a referral was made during today's visit and you haven't received any updates within two weeks, please contact the referred provider directly to check on the status.  Recommended Screenings:  Health Maintenance  Topic Date Due   Medicare Annual Wellness Visit  03/03/2024   COVID-19 Vaccine (4 - 2025-26 season) 05/23/2024   Flu Shot  12/20/2024*   DTaP/Tdap/Td vaccine (3 - Td or Tdap) 06/29/2028   Pneumococcal Vaccine for age over 40  Completed   DEXA scan (bone density measurement)  Completed   Hepatitis C Screening  Completed   Zoster (Shingles) Vaccine  Completed   Meningitis B Vaccine  Aged Out   Breast Cancer Screening  Discontinued   Colon Cancer Screening  Discontinued  *Topic was postponed. The date shown is not the original due date.       07/05/2024   12:47 PM  Advanced Directives  Does Patient Have a Medical Advance Directive? No   Advance Care Planning is important because it: Ensures you receive medical care that aligns with your values, goals, and  preferences. Provides guidance to your family and loved ones, reducing the emotional burden of decision-making during critical moments.  Vision: Annual vision screenings are recommended for early detection of glaucoma, cataracts, and diabetic retinopathy. These exams can also reveal signs of chronic conditions such as diabetes and high blood pressure.  Dental: Annual dental screenings help detect early signs of oral cancer, gum disease, and other conditions linked to overall health, including heart disease and diabetes.  Please see the attached documents for additional preventive care recommendations.

## 2024-07-05 NOTE — Progress Notes (Signed)
 Subjective:   Sabrina [name] is a 78 y.o. who presents for a Medicare Wellness preventive visit.  As a reminder, Annual Wellness Visits don't include a physical exam, and some assessments may be limited, especially if this visit is performed virtually. We may recommend an in-person follow-up visit with your provider if needed.  Visit Complete: Virtual I connected with  [name] on 07/05/24 by a audio enabled telemedicine application and verified that I am speaking with the correct person using two identifiers.  Patient Location: Home  Provider Location: Home Office  I discussed the limitations of evaluation and management by telemedicine. The patient expressed understanding and agreed to proceed.  Vital Signs: Because this visit was a virtual/telehealth visit, some criteria may be missing or patient reported. Any vitals not documented were not able to be obtained and vitals that have been documented are patient reported.  VideoDeclined- This patient declined Librarian, academic. Therefore the visit was completed with audio only.  Persons Participating in Visit: Patient.  AWV Questionnaire: No: Patient Medicare AWV questionnaire was not completed prior to this visit.  Cardiac Risk Factors include: advanced age (>65men, >19 women);dyslipidemia;hypertension     Objective:    Today's Vitals   07/05/24 1333  BP: 117/75  Temp: (!) 85 F (29.4 C)  Weight: 136 lb (61.7 kg)  Height: 5' 3 (1.6 m)   Body mass index is 24.09 kg/m.     07/05/2024   12:47 PM 05/09/2024    8:08 AM 04/16/2024   12:39 PM 07/05/2023    1:28 PM 05/30/2023    1:44 PM 05/08/2023    9:00 AM 05/06/2023   10:07 AM  Advanced Directives  Does Patient Have a Medical Advance Directive? No No No No No No No  Would patient like information on creating a medical advance directive?  No - Patient declined    No - Patient declined No - Patient declined    Current Medications  (verified) Outpatient Encounter Medications as of 07/05/2024  Medication Sig   albuterol  (VENTOLIN  HFA) 108 (90 Base) MCG/ACT inhaler INHALE 2 PUFFS BY MOUTH EVERY 6 HOURS AS NEEDED FOR WHEEZE OR SHORTNESS OF BREATH   aspirin  EC 81 MG EC tablet Take 1 tablet (81 mg total) by mouth daily. Swallow whole.   atorvastatin  (LIPITOR) 80 MG tablet TAKE 1 TABLET BY MOUTH EVERY DAY   blood glucose meter kit and supplies Dispense based on patient and insurance preference. Use up to four times daily as directed. (FOR ICD-10 E10.9, E11.9).   Blood Glucose Monitoring Suppl (TRUE METRIX AIR GLUCOSE METER) w/Device KIT Test BS daily Dx R73.09   cromolyn (NASALCROM) 5.2 MG/ACT nasal spray Place 1 spray into both nostrils 2 (two) times daily as needed for allergies.   docusate sodium (COLACE) 100 MG capsule Take 200 mg by mouth 2 (two) times daily.   DULoxetine  (CYMBALTA ) 60 MG capsule TAKE 1 CAPSULE BY MOUTH EVERY DAY   estradiol  (ESTRACE ) 0.1 MG/GM vaginal cream Place 1 Applicatorful vaginally at bedtime.   fluticasone  (FLONASE ) 50 MCG/ACT nasal spray SPRAY 2 SPRAYS INTO EACH NOSTRIL EVERY DAY   furosemide  (LASIX ) 20 MG tablet TAKE 1 TABLET BY MOUTH EVERY DAY   gabapentin  (NEURONTIN ) 100 MG capsule 100 mg in AM and 300 mg in evening   glucose blood (TRUE METRIX BLOOD GLUCOSE TEST) test strip Test BS daily Dx R73.09   methenamine  (HIPREX ) 1 g tablet Take 1 tablet (1 g total) by mouth 2 (two)  times daily with a meal. Most effective when taken with a daily Vitamin C supplement.   Multiple Vitamin (MULTIVITAMIN WITH MINERALS) TABS tablet Take 1 tablet by mouth in the morning.   nitroGLYCERIN  (NITROSTAT ) 0.4 MG SL tablet Place 1 tablet (0.4 mg total) under the tongue every 5 (five) minutes x 3 doses as needed for chest pain.   Omega-3 Fatty Acids (FISH OIL PO) Take 1 capsule by mouth daily.   pantoprazole  (PROTONIX ) 40 MG tablet TAKE 1 TABLET BY MOUTH TWICE A DAY   potassium chloride  SA (KLOR-CON  M) 20 MEQ tablet  Take 1 tablet (20 mEq total) by mouth 2 (two) times daily. Take 1 tablet twice a day   risedronate  (ACTONEL ) 35 MG tablet Take 1 tablet (35 mg total) by mouth every 7 (seven) days. with water  on empty stomach, nothing by mouth or lie down for next 30 minutes.   SYMBICORT  80-4.5 MCG/ACT inhaler INHALE TWO PUFFS INTO THE LUNGS IN THE MORNING AND 2 PUFFS AT BEDTIME   triamcinolone  cream (KENALOG ) 0.1 % Apply 1 Application topically 2 (two) times daily.   TRUEplus Lancets 33G MISC Test BS daily Dx R73.09   vitamin C (ASCORBIC ACID) 500 MG tablet Take 500 mg by mouth daily.   Vitamin D , Ergocalciferol , (DRISDOL ) 1.25 MG (50000 UNIT) CAPS capsule Take 1 capsule (50,000 Units total) by mouth every 7 (seven) days.   Facility-Administered Encounter Medications as of 07/05/2024  Medication   cyanocobalamin  (VITAMIN B12) injection 1,000 mcg    Allergies (verified) Codeine   History: Past Medical History:  Diagnosis Date   Anemia    Ankle fracture, right    past hx. -no surgery   Anxiety    Asthma    CHF (congestive heart failure) (HCC) 2009   Chronic lower back pain    Collagen vascular disease    COPD (chronic obstructive pulmonary disease) (HCC)    Depression    Fibromyalgia    GERD (gastroesophageal reflux disease)    Hyperlipidemia    Hypertension    Immature cataract of both eyes    Myocardial infarction (HCC)    I've had a light one; don't know when it happened (08/27/2017)   Osteoarthritis    Peripheral neuropathy    legs and feet   Persistent atrial fibrillation (HCC)    Presence of Watchman left atrial appendage closure device 08/01/2021   24 mm Watchman FLXDevice LOT # [id number] by Dr. Cindie   Tremors of nervous system    noted in hands by pt last 6 months   Tubular adenoma of colon    Past Surgical History:  Procedure Laterality Date   APPENDECTOMY     AV NODE ABLATION N/A 08/27/2017   Procedure: AV NODE ABLATION;  Surgeon: Waddell Danelle ORN, MD;  Location: [facility]  INVASIVE CV LAB;  Service: Cardiovascular;  Laterality: N/A;   CATARACT EXTRACTION W/PHACO Right 03/16/2023   Procedure: CATARACT EXTRACTION PHACO AND INTRAOCULAR LENS PLACEMENT (IOC);  Surgeon: Harrie Agent, MD;  Location: [facility];  Service: Ophthalmology;  Laterality: Right;  CDE: 10.62   CATARACT EXTRACTION W/PHACO Left 05/08/2023   Procedure: CATARACT EXTRACTION PHACO AND INTRAOCULAR LENS PLACEMENT (IOC);  Surgeon: Harrie Agent, MD;  Location: [facility];  Service: Ophthalmology;  Laterality: Left;  CDE 11.28   CHOLECYSTECTOMY OPEN  1978   DILATION AND CURETTAGE OF UTERUS     FEMUR FRACTURE SURGERY Left 2013   put 7 rod in it   FRACTURE SURGERY     IVC FILTER  REMOVAL N/A 06/20/2021   Procedure: IVC FILTER REMOVAL;  Surgeon: Gretta Lonni PARAS, MD;  Location: [facility] INVASIVE CV LAB;  Service: Cardiovascular;  Laterality: N/A;   LAPAROSCOPY  08/22/2016   Procedure: LAPAROSCOPY DIAGNOSTIC;  Surgeon: Bernarda Ned, MD;  Location: [facility];  Service: General;;   LEFT ATRIAL APPENDAGE OCCLUSION N/A 08/01/2021   Procedure: LEFT ATRIAL APPENDAGE OCCLUSION;  Surgeon: Cindie Ole DASEN, MD;  Location: [facility]VASIVE CV [facility];  Service: Cardiovascular;  Laterality: N/A;   LEFT HEART CATH AND CORONARY ANGIOGRAPHY N/A 02/06/2022   Procedure: LEFT HEART CATH AND CORONARY ANGIOGRAPHY;  Surgeon: Burnard Ned LABOR, MD;  Location: [facility] INVASIVE CV [facility];  Service: Cardiovascular;  Laterality: N/A;   MEDIAL PARTIAL KNEE REPLACEMENT Right 2005   @ [facility]   PACEMAKER IMPLANT N/A 08/27/2017   Procedure: PACEMAKER IMPLANT;  Surgeon: Waddell Danelle ORN, MD;  Location: [facility] INV[facility]IVE [facility];  Service: Cardiovascular;  Laterality: N/A;   ROUX-EN-Y GASTRIC BYPASS  2002   [facility]   SPLENECTOMY  2002   TEE WITHOUT CARDIOVERSION N/A 08/01/2021   Procedure: TRANSESOPHAGEAL ECHOCARDIOGRAM (TEE);  Surgeon: Cindie Ole DASEN, MD;  Location: [facility] INVASIVE CV LAB;  Service: Cardiovascular;  Laterality: N/A;   TONSILLECTOMY   1944   TUBAL LIGATION     VAGINAL HYSTERECTOMY     Family History  Problem Relation Age of Onset   Cancer Mother    Alzheimer's disease Mother    Heart disease Mother    Arthritis Father    Asthma Daughter    Arthritis Daughter    Obesity Daughter    Colon cancer Paternal Uncle 63   Arthritis Son    Hyperlipidemia Son    Obesity Son    Arthritis Son    Obesity Son    BRCA 1/2 Neg Hx    Social History   Socioeconomic History   Marital status: Divorced    Spouse name: Not on file   Number of children: 3   Years of education: 2 years of college   Highest education level: Some college, no degree  Occupational History   Occupation: Retired  Tobacco Use   Smoking status: Former    Current packs/day: 0.00    Average packs/day: 0.5 packs/day for 25.0 years (12.5 ttl pk-yrs)    Types: Cigarettes    Start date: 02/27/1968    Quit date: 02/26/1993    Years since quitting: 31.3   Smokeless tobacco: Never  Vaping Use   Vaping status: Never Used  Substance and Sexual Activity   Alcohol use: Not Currently    Comment: 08/27/2017 nothing since early 2000s   Drug use: Not Currently   Sexual activity: Not Currently    Birth control/protection: Surgical    Comment: Hyst, Menarche-[age], First IC @ [age], Partners >5, DES-neg  Other Topics Concern   Not on file  Social History Narrative   ** Merged History Encounter **       Pt is right handed Lives in single story home with her grandson Has 3 adult children Associated degree  Retired Lawyer   Social Drivers of Corporate investment banker Strain: Low Risk  (07/05/2024)   Overall Financial Resource Strain (CARDIA)    Difficulty of Paying Living Expenses: Not hard at all  Food Insecurity: No Food Insecurity (07/05/2024)   Hunger Vital Sign    Worried About Running Out of Food in the Last Year: Never true    Ran Out of Food in the Last Year:  Never true  Transportation Needs: No Transportation Needs (07/05/2024)   PRAPARE -  Administrator, Civil Service (Medical): No    Lack of Transportation (Non-Medical): No  Physical Activity: Sufficiently Active (07/05/2024)   Exercise Vital Sign    Days of Exercise per Week: 6 days    Minutes of Exercise per Session: 140 min  Recent Concern: Physical Activity - Insufficiently Active (07/05/2024)   Exercise Vital Sign    Days of Exercise per Week: 3 days    Minutes of Exercise per Session: 30 min  Stress: No Stress Concern Present (03/04/2023)   [employer] of Occupational Health - Occupational Stress Questionnaire    Feeling of Stress : Not at all  Social Connections: Moderately Integrated (07/05/2024)   Social Connection and Isolation Panel    Frequency of Communication with Friends and Family: More than three times a week    Frequency of Social Gatherings with Friends and Family: More than three times a week    Attends Religious Services: More than 4 times per year    Active Member of [employer] or Organizations: Yes    Attends Engineer, structural: More than 4 times per year    Marital Status: Divorced    Tobacco Counseling Counseling given: Yes    Clinical Intake:  Pre-visit preparation completed: Yes  Pain : No/denies pain     BMI - recorded: 24.09 Nutritional Status: BMI of 19-24  Normal Nutritional Risks: None Diabetes: No  Lab Results  Component Value Date   HGBA1C 5.5 03/05/2023   HGBA1C 5.9 09/27/2018   HGBA1C 5.7 (H) 08/18/2016     How often do you need to have someone help you when you read instructions, pamphlets, or other written materials from your doctor or pharmacy?: 1 - Never  Interpreter Needed?: No  Information entered by :: alia t/cma   Activities of Daily Living     07/05/2024   12:44 PM  In your present state of health, do you have any difficulty performing the following activities:  Hearing? 0  Vision? 0  Difficulty concentrating or making decisions? 1  Walking or climbing stairs? 0   Dressing or bathing? 0  Doing errands, shopping? 0  Preparing Food and eating ? N  Using the Toilet? N  In the past six months, have you accidently leaked urine? Y  Do you have problems with loss of bowel control? Y  Managing your Medications? N  Managing your Finances? N  Housekeeping or managing your Housekeeping? N    Patient Care Team: Lavell Bari LABOR, FNP as PCP - General (Family Medicine) Waddell Danelle ORN, MD as PCP - Electrophysiology (Cardiology) Alvan Dorn FALCON, MD as PCP - Cardiology (Cardiology) Georjean Darice HERO, MD as Consulting Physician (Neurology)  I have updated your Care Teams any recent Medical Services you may have received from other providers in the past year.     Assessment:   This is a routine wellness examination for Parlee.  Hearing/Vision screen Hearing Screening - Comments:: Pt denies hearing dif Vision Screening - Comments:: Pt [facility]   Goals Addressed             This Visit's Progress    Increase physical activity   On track    Pt has started going back [facility] the [facility] [employer] 3 days a week and would like to go at least 5 days       Depression Screen  07/05/2024   12:49 PM 05/10/2024    2:00 PM 05/09/2024    8:06 AM 05/03/2024   10:58 AM 03/01/2024   10:56 AM 11/24/2023   10:28 AM 08/06/2023   11:41 AM  PHQ 2/9 Scores  PHQ - 2 Score 0 0 0 1 2 1 4   PHQ- 9 Score  0 0 7 8 6 11     Fall Risk     07/05/2024   12:42 PM 05/03/2024   10:58 AM 11/24/2023   10:28 AM 10/26/2023    8:35 AM 08/06/2023   11:40 AM  Fall Risk   Falls in the past year? 0 1 1 1 1   Number falls in past yr: 1 1 0 1 0  Injury with Fall? 1 1 0 0 0  Risk for fall due to : Impaired balance/gait;Impaired mobility History of fall(s) Impaired balance/gait;Impaired mobility  Impaired mobility;Orthopedic patient  Follow up Falls evaluation completed;Education provided Falls evaluation completed Falls evaluation completed;Education provided  Falls  evaluation completed    MEDICARE RISK AT HOME:  Medicare Risk at Home Any stairs in or around the home?: No If so, are there any without handrails?: No Home free of loose throw rugs in walkways, pet beds, electrical cords, etc?: Yes Adequate lighting in your home to reduce risk of falls?: Yes Life alert?: No Use of a cane, walker or w/c?: No Grab bars in the bathroom?: No Shower chair or bench in shower?: No Elevated toilet seat or a handicapped toilet?: No  TIMED UP AND GO:  Was the test performed?  no  Cognitive Function: 6CIT completed        07/05/2024    1:36 PM 03/04/2023   10:37 AM 02/26/2022   10:44 AM 02/25/2021   10:54 AM 01/31/2020   11:24 AM  6CIT Screen  What Year? 0 points 0 points 0 points 0 points 0 points  What month? 0 points 0 points 0 points 0 points 0 points  What time? 0 points 0 points 0 points 0 points 0 points  Count back from 20 0 points 0 points 0 points 0 points 0 points  Months in reverse 0 points 0 points 0 points 2 points 0 points  Repeat phrase 0 points 0 points 0 points 2 points 2 points  Total Score 0 points 0 points 0 points 4 points 2 points    Immunizations Immunization History  Administered Date(s) Administered   Fluad Quad(high Dose 65+) 06/14/2021, 06/10/2022   Fluad Trivalent(High Dose 65+) 06/02/2023   INFLUENZA, HIGH DOSE SEASONAL PF 07/14/2017, 06/29/2018, 06/19/2019, 08/01/2020   Influenza,inj,Quad PF,6+ Mos 06/21/2014, 08/28/2015, 06/14/2016   Moderna Sars-Covid-2 Vaccination 11/23/2019, 12/21/2019, 05/24/2020   Pneumococcal Conjugate-13 10/19/2015   Pneumococcal Polysaccharide-23 06/14/2016, 06/19/2019   Tdap 05/19/2007, 06/29/2018   Zoster Recombinant(Shingrix) 08/01/2020, 05/07/2021   Zoster, Live 05/19/2014    Screening Tests Health Maintenance  Topic Date Due   COVID-19 Vaccine (4 - 2025-26 season) 05/23/2024   Influenza Vaccine  12/20/2024 (Originally 04/22/2024)   Medicare Annual Wellness (AWV)  07/05/2025    DTaP/Tdap/Td (3 - Td or Tdap) 06/29/2028   Pneumococcal Vaccine: 50+ Years  Completed   DEXA SCAN  Completed   Hepatitis C Screening  Completed   Zoster Vaccines- Shingrix  Completed   Meningococcal B Vaccine  Aged Out   Mammogram  Discontinued   Colonoscopy  Discontinued    Health Maintenance Items Addressed: See Nurse Notes at the end of this note  Additional Screening:  Vision Screening: Recommended annual  ophthalmology exams for early detection of glaucoma and other disorders of the eye. Is the patient up to date with their annual eye exam?  Yes  Who is the provider or what is the name of the office in which the patient attends annual eye exams? [facility] in [place]   Dental Screening: Recommended annual dental exams for proper oral hygiene  Community Resource Referral / Chronic Care Management: CRR required this visit?  No   CCM required this visit?  No   Plan:    I have personally reviewed and noted the following in the patient's chart:   Medical and social history Use of alcohol, tobacco or illicit drugs  Current medications and supplements including opioid prescriptions. Patient is not currently taking opioid prescriptions. Functional ability and status Nutritional status Physical activity Advanced directives List of other physicians Hospitalizations, surgeries, and ER visits in previous 12 months Vitals Screenings to include cognitive, depression, and falls Referrals and appointments  In addition, I have reviewed and discussed with patient certain preventive protocols, quality metrics, and best practice recommendations. A written personalized care plan for preventive services as well as general preventive health recommendations were provided to patient.   Ozie Ned, CMA   07/05/2024   After Visit Summary: (MyChart) Due to this being a telephonic visit, the after visit summary with patients personalized plan was offered to patient via MyChart   Notes:  Nothing significant to report at this time.

## 2024-07-07 ENCOUNTER — Encounter: Payer: Self-pay | Admitting: Cardiology

## 2024-07-07 ENCOUNTER — Ambulatory Visit: Attending: Cardiology | Admitting: Cardiology

## 2024-07-07 VITALS — BP 112/68 | HR 72 | Ht 64.0 in | Wt 142.2 lb

## 2024-07-07 DIAGNOSIS — R600 Localized edema: Secondary | ICD-10-CM | POA: Diagnosis not present

## 2024-07-07 DIAGNOSIS — I4891 Unspecified atrial fibrillation: Secondary | ICD-10-CM | POA: Diagnosis not present

## 2024-07-07 DIAGNOSIS — I442 Atrioventricular block, complete: Secondary | ICD-10-CM

## 2024-07-07 DIAGNOSIS — E782 Mixed hyperlipidemia: Secondary | ICD-10-CM

## 2024-07-07 DIAGNOSIS — I2510 Atherosclerotic heart disease of native coronary artery without angina pectoris: Secondary | ICD-10-CM | POA: Diagnosis not present

## 2024-07-07 MED ORDER — FUROSEMIDE 20 MG PO TABS: 20.0000 mg | ORAL_TABLET | Freq: Every day | ORAL | 1 refills | Status: AC

## 2024-07-07 NOTE — Patient Instructions (Addendum)
 Medication Instructions:   Continue Lasix  at 20mg  daily with the addition of 20mg  as needed for swelling   Continue all other medications.     Labwork:  none  Testing/Procedures:  none  Follow-Up:  6 months   Any Other Special Instructions Will Be Listed Below (If Applicable).   If you need a refill on your cardiac medications before your next appointment, please call your pharmacy.

## 2024-07-07 NOTE — Progress Notes (Signed)
 Clinical Summary Sabrina [name] is a 78 y.o.female seen today for follow up of the following medical problems.    1.Persistent afib/pacemaker - s/p av nodal ablation and pacemaker - Gi bleeding issues on anticoag, now with watchman device.     - no recent palpitations - compliant with meds - 06/2024 normal device function     2. LE edema/chronic diastolic HF 05/2016 60-65%, no WMAs, mild MR, normal RV  -edema controlled. Pcp recently cut back lasix  due to low bp's and AKI that have resolved   - some recent swelling that has increased. Compliant with lasix  20mg      3.Chest pain/CAD - 01/2022 cath nonobstructive disease - no recent chest pains.        4. Hyperlipidemia 03/2023 TC 197 TG 120 HDL 62 LDL 114 - based on this panel atorva was increased to 40mg  daily, needs repeat panel   02/2024 TC 121 TG 63 HDL 65 LDL 42     Doing silver sneakers at the gym Past Medical History:  Diagnosis Date   Anemia    Ankle fracture, right    past hx. -no surgery   Anxiety    Asthma    CHF (congestive heart failure) (HCC) 2009   Chronic lower back pain    Collagen vascular disease    COPD (chronic obstructive pulmonary disease) (HCC)    Depression    Fibromyalgia    GERD (gastroesophageal reflux disease)    Hyperlipidemia    Hypertension    Immature cataract of both eyes    Myocardial infarction (HCC)    I've had a light one; don't know when it happened (08/27/2017)   Osteoarthritis    Peripheral neuropathy    legs and feet   Persistent atrial fibrillation (HCC)    Presence of Watchman left atrial appendage closure device 08/01/2021   24 mm Watchman FLXDevice LOT # [id number] by Dr. Cindie   Tremors of nervous system    noted in hands by pt last 6 months   Tubular adenoma of colon      Allergies  Allergen Reactions   Codeine Nausea And Vomiting    Stomach pain also     Current Outpatient Medications  Medication Sig Dispense Refill   albuterol  (VENTOLIN   HFA) 108 (90 Base) MCG/ACT inhaler INHALE 2 PUFFS BY MOUTH EVERY 6 HOURS AS NEEDED FOR WHEEZE OR SHORTNESS OF BREATH 18 each 0   aspirin  EC 81 MG EC tablet Take 1 tablet (81 mg total) by mouth daily. Swallow whole. 30 tablet 11   atorvastatin  (LIPITOR) 80 MG tablet TAKE 1 TABLET BY MOUTH EVERY DAY 90 tablet 3   blood glucose meter kit and supplies Dispense based on patient and insurance preference. Use up to four times daily as directed. (FOR ICD-10 E10.9, E11.9). 1 each 0   Blood Glucose Monitoring Suppl (TRUE METRIX AIR GLUCOSE METER) w/Device KIT Test BS daily Dx R73.09 1 kit 0   cromolyn (NASALCROM) 5.2 MG/ACT nasal spray Place 1 spray into both nostrils 2 (two) times daily as needed for allergies.     docusate sodium (COLACE) 100 MG capsule Take 200 mg by mouth 2 (two) times daily.     DULoxetine  (CYMBALTA ) 60 MG capsule TAKE 1 CAPSULE BY MOUTH EVERY DAY 90 capsule 0   estradiol  (ESTRACE ) 0.1 MG/GM vaginal cream Place 1 Applicatorful vaginally at bedtime. 42.5 g 12   fluticasone  (FLONASE ) 50 MCG/ACT nasal spray SPRAY 2 SPRAYS INTO EACH NOSTRIL EVERY DAY  48 mL 3   furosemide  (LASIX ) 20 MG tablet TAKE 1 TABLET BY MOUTH EVERY DAY 90 tablet 0   gabapentin  (NEURONTIN ) 100 MG capsule 100 mg in AM and 300 mg in evening 360 capsule 1   glucose blood (TRUE METRIX BLOOD GLUCOSE TEST) test strip Test BS daily Dx R73.09 100 each 3   methenamine  (HIPREX ) 1 g tablet Take 1 tablet (1 g total) by mouth 2 (two) times daily with a meal. Most effective when taken with a daily Vitamin C supplement. 60 tablet 11   Multiple Vitamin (MULTIVITAMIN WITH MINERALS) TABS tablet Take 1 tablet by mouth in the morning.     nitroGLYCERIN  (NITROSTAT ) 0.4 MG SL tablet Place 1 tablet (0.4 mg total) under the tongue every 5 (five) minutes x 3 doses as needed for chest pain. 25 tablet 12   Omega-3 Fatty Acids (FISH OIL PO) Take 1 capsule by mouth daily.     pantoprazole  (PROTONIX ) 40 MG tablet TAKE 1 TABLET BY MOUTH TWICE A DAY  180 tablet 0   potassium chloride  SA (KLOR-CON  M) 20 MEQ tablet Take 1 tablet (20 mEq total) by mouth 2 (two) times daily. Take 1 tablet twice a day 180 tablet 1   risedronate  (ACTONEL ) 35 MG tablet Take 1 tablet (35 mg total) by mouth every 7 (seven) days. with water  on empty stomach, nothing by mouth or lie down for next 30 minutes. 12 tablet 1   SYMBICORT  80-4.5 MCG/ACT inhaler INHALE TWO PUFFS INTO THE LUNGS IN THE MORNING AND 2 PUFFS AT BEDTIME 30.6 each 0   triamcinolone  cream (KENALOG ) 0.1 % Apply 1 Application topically 2 (two) times daily. 60 g 2   TRUEplus Lancets 33G MISC Test BS daily Dx R73.09 100 each 3   vitamin C (ASCORBIC ACID) 500 MG tablet Take 500 mg by mouth daily.     Vitamin D , Ergocalciferol , (DRISDOL ) 1.25 MG (50000 UNIT) CAPS capsule Take 1 capsule (50,000 Units total) by mouth every 7 (seven) days. 12 capsule 1   Current Facility-Administered Medications  Medication Dose Route Frequency Provider Last Rate Last Admin   cyanocobalamin  (VITAMIN B12) injection 1,000 mcg  1,000 mcg Intramuscular Q30 days Lavell Bari LABOR, FNP   1,000 mcg at 07/01/24 1223     Past Surgical History:  Procedure Laterality Date   APPENDECTOMY     AV NODE ABLATION N/A 08/27/2017   Procedure: AV NODE ABLATION;  Surgeon: Waddell Danelle ORN, MD;  Location: [facility] INVASIVE CV LAB;  Service: Cardiovascular;  Laterality: N/A;   CATARACT EXTRACTION W/PHACO Right 03/16/2023   Procedure: CATARACT EXTRACTION PHACO AND INTRAOCULAR LENS PLACEMENT (IOC);  Surgeon: Harrie Agent, MD;  Location: [facility];  Service: Ophthalmology;  Laterality: Right;  CDE: 10.62   CATARACT EXTRACTION W/PHACO Left 05/08/2023   Procedure: CATARACT EXTRACTION PHACO AND INTRAOCULAR LENS PLACEMENT (IOC);  Surgeon: Harrie Agent, MD;  Location: [facility];  Service: Ophthalmology;  Laterality: Left;  CDE 11.28   CHOLECYSTECTOMY OPEN  1978   DILATION AND CURETTAGE OF UTERUS     FEMUR FRACTURE SURGERY Left 2013   put 7 rod in it   FRACTURE  SURGERY     IVC FILTER REMOVAL N/A 06/20/2021   Procedure: IVC FILTER REMOVAL;  Surgeon: Gretta Lonni PARAS, MD;  Location: [facility] INVASIVE CV LAB;  Service: Cardiovascular;  Laterality: N/A;   LAPAROSCOPY  08/22/2016   Procedure: LAPAROSCOPY DIAGNOSTIC;  Surgeon: Bernarda Ned, MD;  Location: [facility];  Service: General;;   LEFT ATRIAL APPENDAGE OCCLUSION  N/A 08/01/2021   Procedure: LEFT ATRIAL APPENDAGE OCCLUSION;  Surgeon: Cindie Ole DASEN, MD;  Location: [facility] INVASIVE CV LAB;  Service: Cardiovascular;  Laterality: N/A;   LEFT HEART CATH AND CORONARY ANGIOGRAPHY N/A 02/06/2022   Procedure: LEFT HEART CATH AND CORONARY ANGIOGRAPHY;  Surgeon: Burnard Debby LABOR, MD;  Location: [facility] INVASIVE CV LAB;  Service: Cardiovascular;  Laterality: N/A;   MEDIAL PARTIAL KNEE REPLACEMENT Right 2005   @ [facility]   PACEMAKER IMPLANT N/A 08/27/2017   Procedure: PACEMAKER IMPLANT;  Surgeon: Waddell Danelle ORN, MD;  Location: [facility] INVASIVE CV LAB;  Service: Cardiovascular;  Laterality: N/A;   ROUX-EN-Y GASTRIC BYPASS  2002   [facility]   SPLENECTOMY  2002   TEE WITHOUT CARDIOVERSION N/A 08/01/2021   Procedure: TRANSESOPHAGEAL ECHOCARDIOGRAM (TEE);  Surgeon: Cindie Ole DASEN, MD;  Location: [facility] INVASIVE CV LAB;  Service: Cardiovascular;  Laterality: N/A;   TONSILLECTOMY  1944   TUBAL LIGATION     VAGINAL HYSTERECTOMY       Allergies  Allergen Reactions   Codeine Nausea And Vomiting    Stomach pain also      Family History  Problem Relation Age of Onset   Cancer Mother    Alzheimer's disease Mother    Heart disease Mother    Arthritis Father    Asthma Daughter    Arthritis Daughter    Obesity Daughter    Colon cancer Paternal Uncle 20   Arthritis Son    Hyperlipidemia Son    Obesity Son    Arthritis Son    Obesity Son    BRCA 1/2 Neg Hx      Social History Sabrina [name] reports that she quit smoking about 31 years ago. Her smoking use included cigarettes. She started smoking about 56  years ago. She has a 12.5 pack-year smoking history. She has never used smokeless tobacco. Sabrina [name] reports that she does not currently use alcohol.    Physical Examination Today's Vitals   07/07/24 1509  BP: 112/68  Pulse: 72  SpO2: 98%  Weight: 142 lb 3.2 oz (64.5 kg)  Height: 5' 4 (1.626 m)   Body mass index is 24.41 kg/m.  Gen: resting comfortably, no acute distress HEENT: no scleral icterus, pupils equal round and reactive, no palptable cervical adenopathy,  CV: RRR, no mrg, no jvd Resp: Clear to auscultation bilaterally GI: abdomen is soft, non-tender, non-distended, normal bowel sounds, no hepatosplenomegaly MSK: extremities are warm, no edema.  Skin: warm, no rash Neuro:  no focal deficits Psych: appropriate affect   Diagnostic Studies  05/2016 echo Study Conclusions   - Left ventricle: The cavity size was normal. There was mild    concentric hypertrophy. Systolic function was normal. The    estimated ejection fraction was in the range of 60% to 65%. Wall    motion was normal; there were no regional wall motion    abnormalities. There was no evidence of elevated ventricular    filling pressure by Doppler parameters.  - Aortic valve: Trileaflet; normal thickness leaflets. There was no    regurgitation.  - Mitral valve: Structurally normal valve. There was mild    regurgitation.  - Left atrium: The atrium was severely dilated.  - Right ventricle: The cavity size was normal. Wall thickness was    normal. Systolic function was normal.  - Right atrium: The atrium was normal in size.  - Tricuspid valve: There was mild regurgitation.  - Pulmonic valve: There was no regurgitation.  -  Pulmonary arteries: Systolic pressure was within the normal    range.  - Inferior vena cava: The vessel was normal in size.  - Pericardium, extracardiac: There was no pericardial effusion.        05/2016 nuclear stress There was no ST segment deviation noted during stress. The  study is normal. This is a low risk study. The left ventricular ejection fraction is hyperdynamic (>65%).   Normal pharmacologic stress test with no evidence of scar or ischemia.    01/2022 cath   Prox LAD lesion is 20% stenosed.   Mid LAD-1 lesion is 20% stenosed.   Mid LAD-2 lesion is 20% stenosed.   1st Mrg lesion is 20% stenosed.   Prox RCA lesion is 20% stenosed.   The left ventricular systolic function is normal.   LV end diastolic pressure is normal.   The left ventricular ejection fraction is 55-65% by visual estimate.   Three-vessel coronary calcification with mild nonobstructive CAD with a dominant RCA.   Normal LV function with EF estimated at 55 to 65%; LVEDP 12 mmHg.   RECOMMENDATION: Medical therapy for multivessel nonobstructive CAD.  Aspirin  for antiplatelet benefit.  Aggressive lipid-lowering therapy with target LDL less than 70.   01/2022 echo    1. Left ventricular ejection fraction, by estimation, is 60 to 65%. The  left ventricle has normal function. The left ventricle has no regional  wall motion abnormalities. Left ventricular diastolic parameters are  indeterminate.   2. Pacing wires in RA/RV . Right ventricular systolic function is normal.  The right ventricular size is normal. There is normal pulmonary artery  systolic pressure.   3. Left atrial size was moderately dilated.   4. Right atrial size was mildly dilated.   5. The mitral valve is abnormal. Mild mitral valve regurgitation. No  evidence of mitral stenosis.   6. The aortic valve is tricuspid. There is mild calcification of the  aortic valve. Aortic valve regurgitation is mild. Aortic valve sclerosis  is present, with no evidence of aortic valve stenosis.   7. The inferior vena cava is normal in size with greater than 50%  respiratory variability, suggesting right atrial pressure of 3 mmHg.        Assessment and Plan   1.Afib/pacemaker - she is s/p av nodal ablation with pacemaker  - has  watchman device, prior GI bleeding issues on anticoagulation - no symptoms, continue current meds - EKG today shows V pacing     2. LE edema -some recent increase per her reports, look ok in clinic. Continue lasix  20mg  daily but add additional 20mg  prn    3. CAD - mild disease by cath -no symptoms, continue current meds     4. Hyperlipidemia -at goal, continue current therapy  F/u 6 months    Dorn PHEBE Ross, M.D.

## 2024-07-18 DIAGNOSIS — H5203 Hypermetropia, bilateral: Secondary | ICD-10-CM | POA: Diagnosis not present

## 2024-07-18 DIAGNOSIS — H524 Presbyopia: Secondary | ICD-10-CM | POA: Diagnosis not present

## 2024-07-18 DIAGNOSIS — Z961 Presence of intraocular lens: Secondary | ICD-10-CM | POA: Diagnosis not present

## 2024-07-18 DIAGNOSIS — H43813 Vitreous degeneration, bilateral: Secondary | ICD-10-CM | POA: Diagnosis not present

## 2024-07-18 DIAGNOSIS — R7303 Prediabetes: Secondary | ICD-10-CM | POA: Diagnosis not present

## 2024-08-02 ENCOUNTER — Ambulatory Visit (INDEPENDENT_AMBULATORY_CARE_PROVIDER_SITE_OTHER): Payer: Self-pay | Admitting: *Deleted

## 2024-08-02 DIAGNOSIS — E538 Deficiency of other specified B group vitamins: Secondary | ICD-10-CM

## 2024-08-02 NOTE — Progress Notes (Signed)
 Patient is in office today for a nurse visit for B12 Injection. Injection was given in the  Left deltoid. Patient tolerated injection well.

## 2024-08-09 ENCOUNTER — Encounter: Payer: Self-pay | Admitting: Internal Medicine

## 2024-08-09 ENCOUNTER — Ambulatory Visit: Attending: Internal Medicine | Admitting: Internal Medicine

## 2024-08-09 VITALS — BP 114/68 | HR 82 | Ht 64.0 in | Wt 140.0 lb

## 2024-08-09 DIAGNOSIS — I482 Chronic atrial fibrillation, unspecified: Secondary | ICD-10-CM

## 2024-08-09 LAB — CUP PACEART INCLINIC DEVICE CHECK
Brady Statistic RA Percent Paced: 100 %
Brady Statistic RV Percent Paced: 0.2 %
Date Time Interrogation Session: 20251118211233
Implantable Lead Connection Status: 753985
Implantable Lead Connection Status: 753985
Implantable Lead Implant Date: 20181206
Implantable Lead Implant Date: 20181206
Implantable Lead Location: 753860
Implantable Lead Location: 753860
Implantable Lead Model: 3830
Implantable Lead Model: 5076
Implantable Pulse Generator Implant Date: 20181206

## 2024-08-09 NOTE — Patient Instructions (Signed)
 Medication Instructions:  Your physician recommends that you continue on your current medications as directed. Please refer to the Current Medication list given to you today.  *If you need a refill on your cardiac medications before your next appointment, please call your pharmacy*  Lab Work: None If you have labs (blood work) drawn today and your tests are completely normal, you will receive your results only by: MyChart Message (if you have MyChart) OR A paper copy in the mail If you have any lab test that is abnormal or we need to change your treatment, we will call you to review the results.  Testing/Procedures: None  Follow-Up: At [facility], you and your health needs are our priority.  As part of our continuing mission to provide you with exceptional heart care, our providers are all part of one team.  This team includes your primary Cardiologist (physician) and Advanced Practice Providers or APPs (Physician Assistants and Nurse Practitioners) who all work together to provide you with the care you need, when you need it.  Your next appointment:   1 year(s)  Provider:   You may see Donnice Primus, MD   We recommend signing up for the patient portal called MyChart.  Sign up information is provided on this After Visit Summary.  MyChart is used to connect with patients for Virtual Visits (Telemedicine).  Patients are able to view lab/test results, encounter notes, upcoming appointments, etc.  Non-urgent messages can be sent to your provider as well.   To learn more about what you can do with MyChart, go to [email or web address].   Other Instructions

## 2024-08-09 NOTE — Progress Notes (Signed)
 HPI [name] returns today for followup. She is a pleasant 78 yo woman with a h/o sinus node dysfunction, s/p PPM insertion. She has a h/o morbid obesity and has started diet and exercise therapy. She has lost almost 80 lbs in 4 years. She has had an episode of GI bleeding and her OAC was stopped. She continues to exercise almost every day.  Allergies  Allergen Reactions   Codeine Nausea And Vomiting    Stomach pain also     Current Outpatient Medications  Medication Sig Dispense Refill   albuterol  (VENTOLIN  HFA) 108 (90 Base) MCG/ACT inhaler INHALE 2 PUFFS BY MOUTH EVERY 6 HOURS AS NEEDED FOR WHEEZE OR SHORTNESS OF BREATH 18 each 0   aspirin  EC 81 MG EC tablet Take 1 tablet (81 mg total) by mouth daily. Swallow whole. 30 tablet 11   atorvastatin  (LIPITOR) 80 MG tablet TAKE 1 TABLET BY MOUTH EVERY DAY 90 tablet 3   blood glucose meter kit and supplies Dispense based on patient and insurance preference. Use up to four times daily as directed. (FOR ICD-10 E10.9, E11.9). 1 each 0   Blood Glucose Monitoring Suppl (TRUE METRIX AIR GLUCOSE METER) w/Device KIT Test BS daily Dx R73.09 1 kit 0   cromolyn (NASALCROM) 5.2 MG/ACT nasal spray Place 1 spray into both nostrils 2 (two) times daily as needed for allergies.     docusate sodium (COLACE) 100 MG capsule Take 200 mg by mouth 2 (two) times daily.     DULoxetine  (CYMBALTA ) 60 MG capsule TAKE 1 CAPSULE BY MOUTH EVERY DAY 90 capsule 0   estradiol  (ESTRACE ) 0.1 MG/GM vaginal cream Place 1 Applicatorful vaginally at bedtime. 42.5 g 12   fluticasone  (FLONASE ) 50 MCG/ACT nasal spray SPRAY 2 SPRAYS INTO EACH NOSTRIL EVERY DAY 48 mL 3   furosemide  (LASIX ) 20 MG tablet Take 1 tablet (20 mg total) by mouth daily. (May take an extra 20mg  as needed for swelling). 135 tablet 1   gabapentin  (NEURONTIN ) 100 MG capsule 100 mg in AM and 300 mg in evening 360 capsule 1   glucose blood (TRUE METRIX BLOOD GLUCOSE TEST) test strip Test BS daily Dx R73.09  100 each 3   methenamine  (HIPREX ) 1 g tablet Take 1 tablet (1 g total) by mouth 2 (two) times daily with a meal. Most effective when taken with a daily Vitamin C supplement. 60 tablet 11   Multiple Vitamin (MULTIVITAMIN WITH MINERALS) TABS tablet Take 1 tablet by mouth in the morning.     nitroGLYCERIN  (NITROSTAT ) 0.4 MG SL tablet Place 1 tablet (0.4 mg total) under the tongue every 5 (five) minutes x 3 doses as needed for chest pain. 25 tablet 12   Omega-3 Fatty Acids (FISH OIL PO) Take 1 capsule by mouth daily.     pantoprazole  (PROTONIX ) 40 MG tablet TAKE 1 TABLET BY MOUTH TWICE A DAY 180 tablet 0   potassium chloride  SA (KLOR-CON  M) 20 MEQ tablet Take 1 tablet (20 mEq total) by mouth 2 (two) times daily. Take 1 tablet twice a day 180 tablet 1   risedronate  (ACTONEL ) 35 MG tablet Take 1 tablet (35 mg total) by mouth every 7 (seven) days. with water  on empty stomach, nothing by mouth or lie down for next 30 minutes. 12 tablet 1   SYMBICORT  80-4.5 MCG/ACT inhaler INHALE TWO PUFFS INTO THE LUNGS IN THE MORNING AND 2 PUFFS AT BEDTIME 30.6 each 0   triamcinolone  cream (KENALOG ) 0.1 % Apply 1  Application topically 2 (two) times daily. 60 g 2   TRUEplus Lancets 33G MISC Test BS daily Dx R73.09 100 each 3   vitamin C (ASCORBIC ACID) 500 MG tablet Take 500 mg by mouth daily.     Vitamin D , Ergocalciferol , (DRISDOL ) 1.25 MG (50000 UNIT) CAPS capsule Take 1 capsule (50,000 Units total) by mouth every 7 (seven) days. 12 capsule 1   Current Facility-Administered Medications  Medication Dose Route Frequency Provider Last Rate Last Admin   cyanocobalamin  (VITAMIN B12) injection 1,000 mcg  1,000 mcg Intramuscular Q30 days Lavell Lye A, FNP   1,000 mcg at 08/02/24 1400     Past Medical History:  Diagnosis Date   Anemia    Ankle fracture, right    past hx. -no surgery   Anxiety    Asthma    CHF (congestive heart failure) (HCC) 2009   Chronic lower back pain    Collagen vascular disease    COPD  (chronic obstructive pulmonary disease) (HCC)    Depression    Fibromyalgia    GERD (gastroesophageal reflux disease)    Hyperlipidemia    Hypertension    Immature cataract of both eyes    Myocardial infarction (HCC)    I've had a light one; don't know when it happened (08/27/2017)   Osteoarthritis    Peripheral neuropathy    legs and feet   Persistent atrial fibrillation (HCC)    Presence of Watchman left atrial appendage closure device 08/01/2021   24 mm Watchman FLXDevice LOT # [id number] by Dr. Cindie   Tremors of nervous system    noted in hands by pt last 6 months   Tubular adenoma of colon     ROS:   All systems reviewed and negative except as noted in the HPI.   Past Surgical History:  Procedure Laterality Date   APPENDECTOMY     AV NODE ABLATION N/A 08/27/2017   Procedure: AV NODE ABLATION;  Surgeon: Waddell Danelle ORN, MD;  Location: [facility];  Service: Cardiovascular;  Laterality: N/A;   CATARACT EXTRACTION W/PHACO Right 03/16/2023   Procedure: CATARACT EXTRACTION PHACO AND INTRAOCULAR LENS PLACEMENT (IOC);  Surgeon: Harrie Agent, MD;  Location: [facility];  Service: Ophthalmology;  Laterality: Right;  CDE: 10.62   CATARACT EXTRACTION W/PHACO Left 05/08/2023   Procedure: CATARACT EXTRACTION PHACO AND INTRAOCULAR LENS PLACEMENT (IOC);  Surgeon: Harrie Agent, MD;  Location: [facility];  Service: Ophthalmology;  Laterality: Left;  CDE 11.28   CHOLECYSTECTOMY OPEN  1978   DILATION AND CURETTAGE OF UTERUS     FEMUR FRACTURE SURGERY Left 2013   put 7 rod in it   FRACTURE SURGERY     IVC FILTER REMOVAL N/A 06/20/2021   Procedure: IVC FILTER REMOVAL;  Surgeon: Gretta Lonni PARAS, MD;  Location: [facility];  Service: Cardiovascular;  Laterality: N/A;   LAPAROSCOPY  08/22/2016   Procedure: LAPAROSCOPY DIAGNOSTIC;  Surgeon: Bernarda Ned, MD;  Location: [facility];  Service: General;;   LEFT ATRIAL APPENDAGE OCCLUSION N/A 08/01/2021   Procedure: LEFT ATRIAL  APPENDAGE OCCLUSION;  Surgeon: Cindie Ole DASEN, MD;  Location: [facility];  Service: Cardiovascular;  Laterality: N/A;   LEFT HEART CATH AND CORONARY ANGIOGRAPHY N/A 02/06/2022   Procedure: LEFT HEART CATH AND CORONARY ANGIOGRAPHY;  Surgeon: Burnard Ned LABOR, MD;  Location: [facility];  Service: Cardiovascular;  Laterality: N/A;   MEDIAL PARTIAL KNEE REPLACEMENT Right 2005   @ [facility]   PACEMAKER IMPLANT N/A  08/27/2017   Procedure: PACEMAKER IMPLANT;  Surgeon: Waddell Danelle ORN, MD;  Location: [facility];  Service: Cardiovascular;  Laterality: N/A;   ROUX-EN-Y GASTRIC BYPASS  2002   [facility]   SPLENECTOMY  2002   TEE WITHOUT CARDIOVERSION N/A 08/01/2021   Procedure: TRANSESOPHAGEAL ECHOCARDIOGRAM (TEE);  Surgeon: Cindie Ole DASEN, MD;  Location: [facility];  Service: Cardiovascular;  Laterality: N/A;   TONSILLECTOMY  1944   TUBAL LIGATION     VAGINAL HYSTERECTOMY       Family History  Problem Relation Age of Onset   Cancer Mother    Alzheimer's disease Mother    Heart disease Mother    Arthritis Father    Asthma Daughter    Arthritis Daughter    Obesity Daughter    Colon cancer Paternal Uncle 37   Arthritis Son    Hyperlipidemia Son    Obesity Son    Arthritis Son    Obesity Son    BRCA 1/2 Neg Hx      Social History   Socioeconomic History   Marital status: Divorced    Spouse name: Not on file   Number of children: 3   Years of education: 2 years of college   Highest education level: Some college, no degree  Occupational History   Occupation: Retired  Tobacco Use   Smoking status: Former    Current packs/day: 0.00    Average packs/day: 0.5 packs/day for 25.0 years (12.5 ttl pk-yrs)    Types: Cigarettes    Start date: 02/27/1968    Quit date: 02/26/1993    Years since quitting: 31.4   Smokeless tobacco: Never  Vaping Use   Vaping status: Never Used  Substance and Sexual Activity   Alcohol use: Not Currently     Comment: 08/27/2017 nothing since early 2000s   Drug use: Not Currently   Sexual activity: Not Currently    Birth control/protection: Surgical    Comment: Hyst, Menarche-[age], First IC @ [age], Partners >5, DES-neg  Other Topics Concern   Not on file  Social History Narrative   ** Merged History Encounter **       Pt is right handed Lives in single story home with her grandson Has 3 adult children Associated degree  Retired LAWYER   Social Drivers of Corporate Investment Banker Strain: Low Risk  (07/05/2024)   Overall Financial Resource Strain (CARDIA)    Difficulty of Paying Living Expenses: Not hard at all  Food Insecurity: No Food Insecurity (07/05/2024)   Hunger Vital Sign    Worried About Running Out of Food in the Last Year: Never true    Ran Out of Food in the Last Year: Never true  Transportation Needs: No Transportation Needs (07/05/2024)   PRAPARE - Administrator, Civil Service (Medical): No    Lack of Transportation (Non-Medical): No  Physical Activity: Sufficiently Active (07/05/2024)   Exercise Vital Sign    Days of Exercise per Week: 6 days    Minutes of Exercise per Session: 140 min  Recent Concern: Physical Activity - Insufficiently Active (07/05/2024)   Exercise Vital Sign    Days of Exercise per Week: 3 days    Minutes of Exercise per Session: 30 min  Stress: No Stress Concern Present (03/04/2023)   [employer] of Occupational Health - Occupational Stress Questionnaire    Feeling of Stress : Not at all  Social Connections: Moderately Integrated (07/05/2024)   Social  Connection and Isolation Panel    Frequency of Communication with Friends and Family: More than three times a week    Frequency of Social Gatherings with Friends and Family: More than three times a week    Attends Religious Services: More than 4 times per year    Active Member of [employer] or Organizations: Yes    Attends Engineer, Structural: More than 4 times per year     Marital Status: Divorced  Intimate Partner Violence: Not At Risk (07/05/2024)   Humiliation, Afraid, Rape, and Kick questionnaire    Fear of Current or Ex-Partner: No    Emotionally Abused: No    Physically Abused: No    Sexually Abused: No     BP 114/68   Pulse 82   Ht 5' 4 (1.626 m)   Wt 140 lb (63.5 kg)   SpO2 95%   BMI 24.03 kg/m   Physical Exam:  Well appearing NAD HEENT: Unremarkable Neck:  No JVD, no thyromegally Lymphatics:  No adenopathy Back:  No CVA tenderness Lungs:  Clear with no wheezes HEART:  Regular rate rhythm, no murmurs, no rubs, no clicks Abd:  soft, positive bowel sounds, no organomegally, no rebound, no guarding Ext:  2 plus pulses, no edema, no cyanosis, no clubbing Skin:  No rashes no nodules Neuro:  CN II through XII intact, motor grossly intact  DEVICE  Normal device function.  See PaceArt for details.   Assess/Plan:  Atrial fib - her rates are well controlled. She is s/p AV node ablation. PPM - her medtronic DDD PM is working normally. She is pacing in her His bundle (atrial port). She is s/p Watchman device. About 2 years of battery longevity. HTN - her bp is well controlled. We will follow. Obesity - she has lost almost 80 lbs. No indication for more weight loss.   Danelle Kennett Symes,MD

## 2024-08-26 ENCOUNTER — Other Ambulatory Visit: Payer: Self-pay | Admitting: Family

## 2024-08-26 DIAGNOSIS — K219 Gastro-esophageal reflux disease without esophagitis: Secondary | ICD-10-CM

## 2024-08-31 ENCOUNTER — Ambulatory Visit (INDEPENDENT_AMBULATORY_CARE_PROVIDER_SITE_OTHER): Admitting: *Deleted

## 2024-08-31 DIAGNOSIS — E538 Deficiency of other specified B group vitamins: Secondary | ICD-10-CM | POA: Diagnosis not present

## 2024-08-31 NOTE — Progress Notes (Signed)
 Patient is in office today for a nurse visit for B12 Injection. Patient Injection was given in the  Right deltoid. Patient tolerated injection well.

## 2024-09-01 ENCOUNTER — Ambulatory Visit

## 2024-09-02 ENCOUNTER — Ambulatory Visit: Payer: Self-pay | Admitting: Family

## 2024-09-02 ENCOUNTER — Ambulatory Visit

## 2024-09-06 ENCOUNTER — Ambulatory Visit: Admitting: Family

## 2024-09-06 ENCOUNTER — Encounter: Payer: Self-pay | Admitting: Family

## 2024-09-06 VITALS — BP 125/83 | HR 74 | Temp 97.9°F | Ht 64.0 in | Wt 140.6 lb

## 2024-09-06 DIAGNOSIS — I10 Essential (primary) hypertension: Secondary | ICD-10-CM

## 2024-09-06 DIAGNOSIS — F411 Generalized anxiety disorder: Secondary | ICD-10-CM

## 2024-09-06 DIAGNOSIS — E1136 Type 2 diabetes mellitus with diabetic cataract: Secondary | ICD-10-CM | POA: Insufficient documentation

## 2024-09-06 DIAGNOSIS — F331 Major depressive disorder, recurrent, moderate: Secondary | ICD-10-CM

## 2024-09-06 DIAGNOSIS — D509 Iron deficiency anemia, unspecified: Secondary | ICD-10-CM

## 2024-09-06 DIAGNOSIS — J4520 Mild intermittent asthma, uncomplicated: Secondary | ICD-10-CM

## 2024-09-06 DIAGNOSIS — Z950 Presence of cardiac pacemaker: Secondary | ICD-10-CM

## 2024-09-06 DIAGNOSIS — I4891 Unspecified atrial fibrillation: Secondary | ICD-10-CM

## 2024-09-06 DIAGNOSIS — I5032 Chronic diastolic (congestive) heart failure: Secondary | ICD-10-CM

## 2024-09-06 DIAGNOSIS — I2510 Atherosclerotic heart disease of native coronary artery without angina pectoris: Secondary | ICD-10-CM

## 2024-09-06 DIAGNOSIS — Z0000 Encounter for general adult medical examination without abnormal findings: Secondary | ICD-10-CM

## 2024-09-06 DIAGNOSIS — G4700 Insomnia, unspecified: Secondary | ICD-10-CM

## 2024-09-06 DIAGNOSIS — Z95811 Presence of heart assist device: Secondary | ICD-10-CM

## 2024-09-06 DIAGNOSIS — K219 Gastro-esophageal reflux disease without esophagitis: Secondary | ICD-10-CM

## 2024-09-06 DIAGNOSIS — E782 Mixed hyperlipidemia: Secondary | ICD-10-CM

## 2024-09-06 NOTE — Progress Notes (Signed)
 Subjective:    Patient ID: Sabrina [name], female    DOB: 10-24-1945, 78 y.o.   MRN: [id number]  Chief Complaint  Patient presents with   Medical Management of Chronic Issues   Pt presents to the office today for CPE.   Pt is followed by Cardiologists every 4 months for A Fib and CHF. Has a pacemaker. Stable.  Has atherosclerosis and takes Lipitor daily.    She has osteoporosis and  taking Fosamax  weekly.   Takes Vit D and Calcium  daily. Last Dexa scan 03/01/24.   She has peripheral neuropathy and takes gabapentin  100 mg BID.  Congestive Heart Failure Presents for follow-up visit. Associated symptoms include fatigue. Pertinent negatives include no edema or shortness of breath. The symptoms have been stable.  Asthma There is no cough, shortness of breath or wheezing. This is a chronic problem. The current episode started more than 1 year ago. The problem occurs intermittently. Associated symptoms include heartburn and malaise/fatigue. She reports moderate improvement on treatment. Her past medical history is significant for asthma.  Gastroesophageal Reflux She complains of belching and heartburn. She reports no coughing or no wheezing. This is a chronic problem. The current episode started more than 1 year ago. The problem occurs occasionally. The symptoms are aggravated by certain foods. Associated symptoms include fatigue. She has tried a PPI for the symptoms. The treatment provided moderate relief.  Anemia Presents for follow-up visit. Symptoms include malaise/fatigue. There has been no bruising/bleeding easily. There is no history of chronic renal disease.  Insomnia Primary symptoms: difficulty falling asleep, frequent awakening, malaise/fatigue.   The current episode started more than one year. The onset quality is gradual. The problem occurs intermittently. Past treatments include medication. The treatment provided moderate relief. PMH includes: depression.    Hyperlipidemia This is a chronic problem. The current episode started more than 1 year ago. The problem is controlled. Recent lipid tests were reviewed and are normal. She has no history of chronic renal disease. Pertinent negatives include no shortness of breath. Current antihyperlipidemic treatment includes statins. The current treatment provides moderate improvement of lipids. Risk factors for coronary artery disease include dyslipidemia, hypertension, a sedentary lifestyle and post-menopausal.  Anxiety Presents for follow-up visit. Symptoms include excessive worry, insomnia and nervous/anxious behavior. Patient reports no shortness of breath. Symptoms occur occasionally. The severity of symptoms is mild.   Her past medical history is significant for asthma.  Depression        This is a chronic problem.  The current episode started more than 1 year ago.   The onset quality is gradual.   The problem occurs intermittently.  Associated symptoms include fatigue, insomnia and sad.  Associated symptoms include no helplessness and no hopelessness.  Past treatments include SNRIs - Serotonin and norepinephrine reuptake inhibitors.  Past medical history includes anxiety.       Review of Systems  Constitutional:  Positive for fatigue and malaise/fatigue.  Respiratory:  Negative for cough, shortness of breath and wheezing.   Gastrointestinal:  Positive for heartburn.  Hematological:  Does not bruise/bleed easily.  Psychiatric/Behavioral:  Positive for depression. The patient is nervous/anxious and has insomnia.   All other systems reviewed and are negative.  Family History  Problem Relation Age of Onset   Cancer Mother    Alzheimer's disease Mother    Heart disease Mother    Arthritis Father    Asthma Daughter    Arthritis Daughter    Obesity Daughter    Colon cancer  Paternal Uncle 50   Arthritis Son    Hyperlipidemia Son    Obesity Son    Arthritis Son    Obesity Son    BRCA 1/2 Neg Hx     Social History   Socioeconomic History   Marital status: Divorced    Spouse name: Not on file   Number of children: 3   Years of education: 2 years of college   Highest education level: Some college, no degree  Occupational History   Occupation: Retired  Tobacco Use   Smoking status: Former    Current packs/day: 0.00    Average packs/day: 0.5 packs/day for 25.0 years (12.5 ttl pk-yrs)    Types: Cigarettes    Start date: 02/27/1968    Quit date: 02/26/1993    Years since quitting: 31.5   Smokeless tobacco: Never  Vaping Use   Vaping status: Never Used  Substance and Sexual Activity   Alcohol use: Not Currently    Comment: 08/27/2017 nothing since early 2000s   Drug use: Not Currently   Sexual activity: Not Currently    Birth control/protection: Surgical    Comment: Hyst, Menarche-10/11, First IC @ [age], Partners >5, DES-neg  Other Topics Concern   Not on file  Social History Narrative   ** Merged History Encounter **       Pt is right handed Lives in single story home with her grandson Has 3 adult children Associated degree  Retired LAWYER   Social Drivers of Health   Tobacco Use: Medium Risk (09/06/2024)   Patient History    Smoking Tobacco Use: Former    Smokeless Tobacco Use: Never    Passive Exposure: Not on Actuary Strain: Low Risk (07/05/2024)   Overall Financial Resource Strain (CARDIA)    Difficulty of Paying Living Expenses: Not hard at all  Food Insecurity: No Food Insecurity (07/05/2024)   Epic    Worried About Programme Researcher, Broadcasting/film/video in the Last Year: Never true    Ran Out of Food in the Last Year: Never true  Transportation Needs: No Transportation Needs (07/05/2024)   Epic    Lack of Transportation (Medical): No    Lack of Transportation (Non-Medical): No  Physical Activity: Sufficiently Active (07/05/2024)   Exercise Vital Sign    Days of Exercise per Week: 6 days    Minutes of Exercise per Session: 140 min  Recent Concern:  Physical Activity - Insufficiently Active (07/05/2024)   Exercise Vital Sign    Days of Exercise per Week: 3 days    Minutes of Exercise per Session: 30 min  Stress: No Stress Concern Present (03/04/2023)   [employer] of Occupational Health - Occupational Stress Questionnaire    Feeling of Stress : Not at all  Social Connections: Moderately Integrated (07/05/2024)   Social Connection and Isolation Panel    Frequency of Communication with Friends and Family: More than three times a week    Frequency of Social Gatherings with Friends and Family: More than three times a week    Attends Religious Services: More than 4 times per year    Active Member of Clubs or Organizations: Yes    Attends Banker Meetings: More than 4 times per year    Marital Status: Divorced  Depression (PHQ2-9): Medium Risk (09/06/2024)   Depression (PHQ2-9)    PHQ-2 Score: 6  Alcohol Screen: Low Risk (07/05/2024)   Alcohol Screen    Last Alcohol Screening Score (AUDIT): 0  Housing: Unknown (  07/05/2024)   Epic    Unable to Pay for Housing in the Last Year: No    Number of Times Moved in the Last Year: Not on file    Homeless in the Last Year: No  Utilities: Not At Risk (07/05/2024)   Epic    Threatened with loss of utilities: No  Health Literacy: Adequate Health Literacy (07/05/2024)   B1300 Health Literacy    Frequency of need for help with medical instructions: Never       Objective:   Physical Exam Vitals reviewed.  Constitutional:      General: She is not in acute distress.    Appearance: She is well-developed.  HENT:     Head: Normocephalic and atraumatic.     Right Ear: Tympanic membrane normal.     Left Ear: Tympanic membrane normal.  Eyes:     Pupils: Pupils are equal, round, and reactive to light.  Neck:     Thyroid : No thyromegaly.  Cardiovascular:     Rate and Rhythm: Normal rate and regular rhythm.     Heart sounds: Normal heart sounds. No murmur heard. Pulmonary:      Effort: Pulmonary effort is normal. No respiratory distress.     Breath sounds: Normal breath sounds. No wheezing.  Abdominal:     General: Bowel sounds are normal. There is no distension.     Palpations: Abdomen is soft.     Tenderness: There is no abdominal tenderness.  Musculoskeletal:        General: No tenderness. Normal range of motion.     Cervical back: Normal range of motion and neck supple.  Skin:    General: Skin is warm and dry.     Comments: Callus of ball on right foot  Neurological:     Mental Status: She is alert and oriented to person, place, and time.     Cranial Nerves: No cranial nerve deficit.     Motor: Weakness present.     Deep Tendon Reflexes: Reflexes are normal and symmetric.  Psychiatric:        Behavior: Behavior normal.        Thought Content: Thought content normal.        Judgment: Judgment normal.      BP 125/83   Pulse 74   Temp 97.9 F (36.6 C) (Temporal)   Ht 5' 4 (1.626 m)   Wt 140 lb 9.6 oz (63.8 kg)   SpO2 99%   BMI 24.13 kg/m       Assessment & Plan:   Sabrina [name] comes in today with chief complaint of Medical Management of Chronic Issues   Diagnosis and orders addressed:  1. Presence of heart assist device (HCC) - CMP14+EGFR  2. Annual physical exam (Primary) - CMP14+EGFR - Anemia Profile B - TSH  3. Benign essential HTN  - CMP14+EGFR  4. Chronic diastolic heart failure (HCC) - CMP14+EGFR  5. Atrial fibrillation with RVR (HCC)  - CMP14+EGFR  6. Atherosclerosis of native coronary artery of native heart without angina pectoris  - CMP14+EGFR  7. Gastroesophageal reflux disease, unspecified whether esophagitis present - CMP14+EGFR  8. Mild intermittent asthma without complication  - CMP14+EGFR  9. Generalized anxiety disorder  - CMP14+EGFR  10. Moderate episode of recurrent major depressive disorder (HCC) - CMP14+EGFR  11. Pacemaker - CMP14+EGFR  12. Mixed hyperlipidemia -  CMP14+EGFR  13. Insomnia, unspecified type - CMP14+EGFR  14. Iron deficiency anemia, unspecified iron deficiency anemia type - CMP14+EGFR -  Anemia Profile B    Labs pending Continue current medications  Keep follow up with Cardiologists  Health Maintenance reviewed Diet and exercise encouraged  Follow up plan: 4 month   Bari Learn, FNP

## 2024-09-06 NOTE — Patient Instructions (Signed)
Iron Deficiency Anemia, Adult  Iron deficiency anemia is a condition in which the concentration of red blood cells or hemoglobin in the blood is below normal because of too little iron. Hemoglobin is a substance in red blood cells that carries oxygen to the body's tissues. When the concentration of red blood cells or hemoglobin is too low, not enough oxygen reaches these tissues. Iron deficiency anemia is usually long-lasting, and it develops over time. It may or may not cause symptoms. It is a common type of anemia. What are the causes? This condition may be caused by: Not enough iron in the diet. Abnormal absorption in the gut. Blood loss. What increases the risk? You are more likely to develop this condition if you get menstrual periods (menstruate) or are pregnant. What are the signs or symptoms? Symptoms of this condition may include: Pale skin, lips, and nail beds. Weakness, dizziness, and getting tired easily. Shortness of breath when moving or exercising. Cold hands or feet. Mild anemia may not cause any symptoms. How is this diagnosed? This condition is diagnosed based on: Your medical history. A physical exam. Blood tests. How is this treated? This condition is treated by correcting the cause of your iron deficiency. Treatment may involve: Adding iron-rich foods to your diet. Taking iron supplements. If you are pregnant or breastfeeding, you may need to take extra iron because your normal diet usually does not provide the amount of iron that you need. Increasing vitamin C intake. Vitamin C helps your body absorb iron. Your health care provider may recommend that you take iron supplements along with a glass of orange juice or a vitamin C supplement. Medicines to make heavy menstrual flow lighter. Surgery or additional testing procedures to determine the cause of your anemia. You may need repeat blood tests to determine whether treatment is working. If the treatment does not  seem to be working, you may need more tests. Follow these instructions at home: Medicines Take over-the-counter and prescription medicines only as told by your health care provider. This includes iron supplements and vitamins. This is important because too much iron can be harmful. For the best iron absorption, you should take iron supplements when your stomach is empty. If you cannot tolerate them on an empty stomach, you may need to take them with food. Do not drink milk or take antacids at the same time as your iron supplements. Milk and antacids may interfere with how your body absorbs iron. Iron supplements may turn stool (feces) a darker color and it may appear black. If you cannot tolerate taking iron supplements by mouth, talk with your health care provider about taking them through an IV or through an injection into a muscle. Eating and drinking Talk with your health care provider before changing your diet. Your provider may recommend that you eat foods that contain a lot of iron, such as: Liver. Low-fat (lean) beef. Breads and cereals that have iron added to them (are fortified). Eggs. Dried fruit. Dark green, leafy vegetables. To help your body use the iron from iron-rich foods, eat those foods at the same time as fresh fruits and vegetables that are high in vitamin C. Foods that are high in vitamin C include: Oranges. Peppers. Tomatoes. Mangoes. Managing constipation If you are taking an iron supplement, it may cause constipation. To prevent or treat constipation, you may need to: Drink enough fluid to keep your urine pale yellow. Take over-the-counter or prescription medicines. Eat foods that are high in fiber, such   as beans, whole grains, and fresh fruits and vegetables. Limit foods that are high in fat and processed sugars, such as fried or sweet foods. General instructions Return to your normal activities as told by your health care provider. Ask your health care provider  what activities are safe for you. Keep all follow-up visits. Contact a health care provider if: You feel nauseous or you vomit. You feel weak. You become light-headed when getting up from a sitting or lying down position. You have unexplained sweating. You develop symptoms of constipation. You have a heaviness in your chest. You have trouble breathing with physical activity. Get help right away if: You faint. If this happens, do not drive yourself to the hospital. You have an irregular or rapid heartbeat. Summary Iron deficiency anemia is a condition in which the concentration of red blood cells or hemoglobin in the blood is below normal because of too little iron. This condition is treated by correcting the cause of your iron deficiency. Take over-the-counter and prescription medicines only as told by your health care provider. This includes iron supplements and vitamins. To help your body use the iron from iron-rich foods, eat those foods at the same time as fresh fruits and vegetables that are high in vitamin C. Seek medical help if you have signs or symptoms of worsening anemia. This information is not intended to replace advice given to you by your health care provider. Make sure you discuss any questions you have with your health care provider. Document Revised: 10/16/2021 Document Reviewed: 10/16/2021 [employer] Patient Education  2024 [employer]  

## 2024-09-07 ENCOUNTER — Telehealth: Payer: Self-pay

## 2024-09-07 DIAGNOSIS — N390 Urinary tract infection, site not specified: Secondary | ICD-10-CM

## 2024-09-07 LAB — ANEMIA PROFILE B
Basophils Absolute: 0.1 x10E3/uL (ref 0.0–0.2)
Basos: 1 %
EOS (ABSOLUTE): 0.4 x10E3/uL (ref 0.0–0.4)
Eos: 5 %
Ferritin: 45 ng/mL (ref 15–150)
Folate: >20 ng/mL (ref >3.0)
Hematocrit: 42.8 % (ref 34.0–46.6)
Hemoglobin: 14.3 g/dL (ref 11.1–15.9)
Immature Grans (Abs): 0 x10E3/uL (ref 0.0–0.1)
Immature Granulocytes: 0 %
Iron Saturation: 26 % (ref 15–55)
Iron: 91 ug/dL (ref 27–139)
Lymphocytes Absolute: 2 x10E3/uL (ref 0.7–3.1)
Lymphs: 25 %
MCH: 30.7 pg (ref 26.6–33.0)
MCHC: 33.4 g/dL (ref 31.5–35.7)
MCV: 92 fL (ref 79–97)
Monocytes Absolute: 0.6 x10E3/uL (ref 0.1–0.9)
Monocytes: 7 %
Neutrophils Absolute: 5 x10E3/uL (ref 1.4–7.0)
Neutrophils: 62 %
Platelets: 333 x10E3/uL (ref 150–450)
RBC: 4.66 x10E6/uL (ref 3.77–5.28)
RDW: 13 % (ref 11.7–15.4)
Retic Ct Pct: 1.1 % (ref 0.6–2.6)
Total Iron Binding Capacity: 353 ug/dL (ref 250–450)
UIBC: 262 ug/dL (ref 118–369)
Vitamin B-12: 854 pg/mL (ref 232–1245)
WBC: 8.1 x10E3/uL (ref 3.4–10.8)

## 2024-09-07 LAB — CMP14+EGFR
ALT: 20 IU/L (ref 0–32)
AST: 25 IU/L (ref 0–40)
Albumin: 4.1 g/dL (ref 3.8–4.8)
Alkaline Phosphatase: 97 IU/L (ref 49–135)
BUN/Creatinine Ratio: 27 (ref 12–28)
BUN: 26 mg/dL (ref 8–27)
Bilirubin Total: 0.8 mg/dL (ref 0.0–1.2)
CO2: 28 mmol/L (ref 20–29)
Calcium: 9.4 mg/dL (ref 8.7–10.3)
Chloride: 97 mmol/L (ref 96–106)
Creatinine, Ser: 0.95 mg/dL (ref 0.57–1.00)
Globulin, Total: 2.4 g/dL (ref 1.5–4.5)
Glucose: 96 mg/dL (ref 70–99)
Potassium: 4.5 mmol/L (ref 3.5–5.2)
Sodium: 137 mmol/L (ref 134–144)
Total Protein: 6.5 g/dL (ref 6.0–8.5)
eGFR: 62 mL/min/1.73 (ref >59)

## 2024-09-07 LAB — TSH: TSH: 3.33 u[IU]/mL (ref 0.450–4.500)

## 2024-09-08 ENCOUNTER — Ambulatory Visit: Payer: Self-pay | Admitting: Family

## 2024-09-09 ENCOUNTER — Other Ambulatory Visit: Payer: Self-pay | Admitting: Family

## 2024-09-09 DIAGNOSIS — M26609 Unspecified temporomandibular joint disorder, unspecified side: Secondary | ICD-10-CM

## 2024-09-12 MED ORDER — METHENAMINE HIPPURATE 1 G PO TABS: 1.0000 g | ORAL_TABLET | Freq: Two times a day (BID) | ORAL | 11 refills | Status: AC

## 2024-09-26 ENCOUNTER — Ambulatory Visit: Payer: Medicare HMO

## 2024-09-28 LAB — CUP PACEART REMOTE DEVICE CHECK
Battery Remaining Longevity: 20 mo
Battery Voltage: 2.81 V
Brady Statistic AP VP Percent: 0.34 %
Brady Statistic AP VS Percent: 99.56 %
Brady Statistic AS VP Percent: 0 %
Brady Statistic AS VS Percent: 0.1 %
Brady Statistic RA Percent Paced: 100 %
Brady Statistic RV Percent Paced: 0.34 %
Date Time Interrogation Session: 20260105011948
Implantable Lead Connection Status: 753985
Implantable Lead Connection Status: 753985
Implantable Lead Implant Date: 20181206
Implantable Lead Implant Date: 20181206
Implantable Lead Location: 753860
Implantable Lead Location: 753860
Implantable Lead Model: 3830
Implantable Lead Model: 5076
Implantable Pulse Generator Implant Date: 20181206
Lead Channel Impedance Value: 285 Ohm
Lead Channel Impedance Value: 342 Ohm
Lead Channel Impedance Value: 380 Ohm
Lead Channel Impedance Value: 399 Ohm
Lead Channel Sensing Intrinsic Amplitude: 3.625 mV
Lead Channel Sensing Intrinsic Amplitude: 4.75 mV
Lead Channel Sensing Intrinsic Amplitude: 9.75 mV
Lead Channel Sensing Intrinsic Amplitude: 9.75 mV
Lead Channel Setting Pacing Amplitude: 2 V
Lead Channel Setting Pacing Amplitude: 2.5 V
Lead Channel Setting Pacing Pulse Width: 0.3 ms
Lead Channel Setting Sensing Sensitivity: 2 mV
Zone Setting Status: 755011
Zone Setting Status: 755011

## 2024-10-01 ENCOUNTER — Ambulatory Visit: Payer: Self-pay | Admitting: Cardiovascular Disease

## 2024-10-03 ENCOUNTER — Ambulatory Visit

## 2024-10-03 DIAGNOSIS — E538 Deficiency of other specified B group vitamins: Secondary | ICD-10-CM

## 2024-10-03 NOTE — Progress Notes (Signed)
 Patient is in office today for a nurse visit for B12 Injection. Patient Injection was given in the  Left deltoid. Patient tolerated injection well.

## 2024-10-07 ENCOUNTER — Telehealth: Payer: Self-pay

## 2024-10-07 NOTE — Telephone Encounter (Signed)
 Sabrina [name]

## 2024-10-08 ENCOUNTER — Other Ambulatory Visit: Payer: Self-pay | Admitting: Family

## 2024-10-08 DIAGNOSIS — F411 Generalized anxiety disorder: Secondary | ICD-10-CM

## 2024-10-11 ENCOUNTER — Telehealth: Payer: Self-pay

## 2024-10-11 NOTE — Telephone Encounter (Signed)
 SABRA

## 2024-10-14 ENCOUNTER — Other Ambulatory Visit: Payer: Self-pay | Admitting: Family

## 2024-10-14 DIAGNOSIS — M810 Age-related osteoporosis without current pathological fracture: Secondary | ICD-10-CM

## 2024-11-04 ENCOUNTER — Ambulatory Visit

## 2024-11-07 ENCOUNTER — Ambulatory Visit: Admitting: Urology

## 2025-01-05 ENCOUNTER — Ambulatory Visit: Admitting: Family

## 2025-03-27 ENCOUNTER — Encounter

## 2025-06-26 ENCOUNTER — Encounter

## 2025-07-06 ENCOUNTER — Ambulatory Visit: Payer: Self-pay

## 2025-09-25 ENCOUNTER — Encounter

## 2025-12-25 ENCOUNTER — Encounter
